# Patient Record
Sex: Female | Born: 1955
Health system: Southern US, Community
[De-identification: ages and names within clinical notes are randomized; demographics above are authoritative.]

## PROBLEM LIST (undated history)

## (undated) DIAGNOSIS — F329 Major depressive disorder, single episode, unspecified: Secondary | ICD-10-CM

## (undated) DIAGNOSIS — K222 Esophageal obstruction: Secondary | ICD-10-CM

## (undated) DIAGNOSIS — N029 Recurrent and persistent hematuria with unspecified morphologic changes: Secondary | ICD-10-CM

## (undated) DIAGNOSIS — K219 Gastro-esophageal reflux disease without esophagitis: Secondary | ICD-10-CM

## (undated) DIAGNOSIS — T7840XA Allergy, unspecified, initial encounter: Secondary | ICD-10-CM

## (undated) DIAGNOSIS — J449 Chronic obstructive pulmonary disease, unspecified: Secondary | ICD-10-CM

## (undated) DIAGNOSIS — K589 Irritable bowel syndrome without diarrhea: Secondary | ICD-10-CM

## (undated) DIAGNOSIS — M1990 Unspecified osteoarthritis, unspecified site: Secondary | ICD-10-CM

## (undated) DIAGNOSIS — N184 Chronic kidney disease, stage 4 (severe): Secondary | ICD-10-CM

## (undated) DIAGNOSIS — E1140 Type 2 diabetes mellitus with diabetic neuropathy, unspecified: Secondary | ICD-10-CM

## (undated) DIAGNOSIS — E119 Type 2 diabetes mellitus without complications: Secondary | ICD-10-CM

## (undated) DIAGNOSIS — F32A Depression, unspecified: Secondary | ICD-10-CM

## (undated) DIAGNOSIS — I2510 Atherosclerotic heart disease of native coronary artery without angina pectoris: Secondary | ICD-10-CM

## (undated) DIAGNOSIS — I10 Essential (primary) hypertension: Secondary | ICD-10-CM

## (undated) DIAGNOSIS — I700 Atherosclerosis of aorta: Secondary | ICD-10-CM

## (undated) DIAGNOSIS — N200 Calculus of kidney: Secondary | ICD-10-CM

## (undated) DIAGNOSIS — I272 Pulmonary hypertension, unspecified: Secondary | ICD-10-CM

## (undated) DIAGNOSIS — I739 Peripheral vascular disease, unspecified: Secondary | ICD-10-CM

## (undated) DIAGNOSIS — IMO0001 Reserved for inherently not codable concepts without codable children: Secondary | ICD-10-CM

## (undated) DIAGNOSIS — E785 Hyperlipidemia, unspecified: Secondary | ICD-10-CM

## (undated) DIAGNOSIS — E669 Obesity, unspecified: Secondary | ICD-10-CM

## (undated) HISTORY — DX: Type 2 diabetes mellitus without complications: E11.9

## (undated) HISTORY — PX: ABDOMINAL HYSTERECTOMY: SHX81

## (undated) HISTORY — PX: CARPAL TUNNEL RELEASE: SHX101

## (undated) HISTORY — DX: Calculus of kidney: N20.0

## (undated) HISTORY — DX: Allergy, unspecified, initial encounter: T78.40XA

## (undated) HISTORY — DX: Hyperlipidemia, unspecified: E78.5

## (undated) HISTORY — DX: Reserved for inherently not codable concepts without codable children: IMO0001

## (undated) HISTORY — DX: Obesity, unspecified: E66.9

## (undated) HISTORY — DX: Essential (primary) hypertension: I10

## (undated) HISTORY — DX: Irritable bowel syndrome, unspecified: K58.9

## (undated) HISTORY — PX: CARDIAC CATHETERIZATION: SHX172

## (undated) HISTORY — DX: Depression, unspecified: F32.A

## (undated) HISTORY — DX: Unspecified osteoarthritis, unspecified site: M19.90

## (undated) HISTORY — DX: Esophageal obstruction: K22.2

## (undated) HISTORY — DX: Recurrent and persistent hematuria with unspecified morphologic changes: N02.9

## (undated) HISTORY — DX: Peripheral vascular disease, unspecified: I73.9

## (undated) HISTORY — DX: Major depressive disorder, single episode, unspecified: F32.9

## (undated) HISTORY — DX: Gastro-esophageal reflux disease without esophagitis: K21.9

## (undated) HISTORY — DX: Pulmonary hypertension, unspecified: I27.20

## (undated) HISTORY — DX: Atherosclerosis of aorta: I70.0

---

## 1998-02-14 ENCOUNTER — Observation Stay (HOSPITAL_COMMUNITY): Admission: AD | Admit: 1998-02-14 | Discharge: 1998-02-15 | Payer: Self-pay | Admitting: Cardiology

## 1998-03-01 ENCOUNTER — Inpatient Hospital Stay: Admission: RE | Admit: 1998-03-01 | Discharge: 1998-03-03 | Payer: Self-pay | Admitting: *Deleted

## 1999-01-26 ENCOUNTER — Encounter: Payer: Self-pay | Admitting: Cardiology

## 1999-01-26 ENCOUNTER — Ambulatory Visit (HOSPITAL_COMMUNITY): Admission: RE | Admit: 1999-01-26 | Discharge: 1999-01-26 | Payer: Self-pay | Admitting: Cardiology

## 2004-03-14 ENCOUNTER — Ambulatory Visit (HOSPITAL_COMMUNITY): Admission: RE | Admit: 2004-03-14 | Discharge: 2004-03-14 | Payer: Self-pay | Admitting: Internal Medicine

## 2004-03-14 ENCOUNTER — Encounter: Payer: Self-pay | Admitting: Internal Medicine

## 2004-03-14 ENCOUNTER — Encounter (INDEPENDENT_AMBULATORY_CARE_PROVIDER_SITE_OTHER): Payer: Self-pay | Admitting: Specialist

## 2004-09-18 ENCOUNTER — Ambulatory Visit: Payer: Self-pay | Admitting: Internal Medicine

## 2004-10-12 ENCOUNTER — Ambulatory Visit: Payer: Self-pay | Admitting: Internal Medicine

## 2004-11-09 ENCOUNTER — Ambulatory Visit: Payer: Self-pay | Admitting: Internal Medicine

## 2005-01-14 ENCOUNTER — Ambulatory Visit: Payer: Self-pay | Admitting: Internal Medicine

## 2005-05-14 ENCOUNTER — Ambulatory Visit: Payer: Self-pay | Admitting: Internal Medicine

## 2005-09-16 ENCOUNTER — Ambulatory Visit: Payer: Self-pay | Admitting: Internal Medicine

## 2005-10-15 ENCOUNTER — Ambulatory Visit: Payer: Self-pay | Admitting: Internal Medicine

## 2006-01-16 ENCOUNTER — Ambulatory Visit: Payer: Self-pay | Admitting: Internal Medicine

## 2006-05-19 ENCOUNTER — Ambulatory Visit: Payer: Self-pay | Admitting: Internal Medicine

## 2006-07-15 ENCOUNTER — Ambulatory Visit: Payer: Self-pay | Admitting: Internal Medicine

## 2006-07-24 ENCOUNTER — Encounter: Payer: Self-pay | Admitting: Internal Medicine

## 2006-07-24 ENCOUNTER — Ambulatory Visit: Payer: Self-pay | Admitting: Internal Medicine

## 2006-08-26 ENCOUNTER — Ambulatory Visit: Payer: Self-pay | Admitting: Internal Medicine

## 2006-09-16 ENCOUNTER — Ambulatory Visit: Payer: Self-pay | Admitting: Internal Medicine

## 2007-01-15 ENCOUNTER — Ambulatory Visit: Payer: Self-pay | Admitting: Internal Medicine

## 2007-01-15 LAB — CONVERTED CEMR LAB
ALT: 19 units/L (ref 0–40)
Albumin: 3.3 g/dL — ABNORMAL LOW (ref 3.5–5.2)
BUN: 7 mg/dL (ref 6–23)
CO2: 35 meq/L — ABNORMAL HIGH (ref 19–32)
Calcium: 9 mg/dL (ref 8.4–10.5)
Chloride: 98 meq/L (ref 96–112)
Cholesterol: 123 mg/dL (ref 0–200)
Creatinine, Ser: 1.1 mg/dL (ref 0.4–1.2)
GFR calc Af Amer: 67 mL/min
GFR calc non Af Amer: 55 mL/min
Glucose, Bld: 74 mg/dL (ref 70–99)
HDL: 29.1 mg/dL — ABNORMAL LOW (ref >39.0)
Hgb A1c MFr Bld: 6.9 % — ABNORMAL HIGH (ref 4.6–6.0)
LDL Cholesterol: 54 mg/dL (ref 0–99)
Phosphorus: 3.5 mg/dL (ref 2.3–4.6)
Potassium: 4.1 meq/L (ref 3.5–5.1)
Sodium: 139 meq/L (ref 135–145)
Total CHOL/HDL Ratio: 4.2
Triglycerides: 200 mg/dL — ABNORMAL HIGH (ref 0–149)
VLDL: 40 mg/dL (ref 0–40)

## 2007-02-05 ENCOUNTER — Ambulatory Visit: Payer: Medicare Other | Admitting: Internal Medicine

## 2007-02-25 ENCOUNTER — Telehealth: Payer: Self-pay | Admitting: Internal Medicine

## 2007-02-26 DIAGNOSIS — I10 Essential (primary) hypertension: Secondary | ICD-10-CM

## 2007-02-26 DIAGNOSIS — M1990 Unspecified osteoarthritis, unspecified site: Secondary | ICD-10-CM | POA: Insufficient documentation

## 2007-02-26 DIAGNOSIS — F39 Unspecified mood [affective] disorder: Secondary | ICD-10-CM | POA: Insufficient documentation

## 2007-02-26 DIAGNOSIS — E785 Hyperlipidemia, unspecified: Secondary | ICD-10-CM | POA: Insufficient documentation

## 2007-02-26 DIAGNOSIS — E1149 Type 2 diabetes mellitus with other diabetic neurological complication: Secondary | ICD-10-CM | POA: Insufficient documentation

## 2007-02-26 DIAGNOSIS — I739 Peripheral vascular disease, unspecified: Secondary | ICD-10-CM

## 2007-02-26 DIAGNOSIS — K219 Gastro-esophageal reflux disease without esophagitis: Secondary | ICD-10-CM

## 2007-02-27 DIAGNOSIS — I25119 Atherosclerotic heart disease of native coronary artery with unspecified angina pectoris: Secondary | ICD-10-CM

## 2007-02-27 DIAGNOSIS — I2510 Atherosclerotic heart disease of native coronary artery without angina pectoris: Secondary | ICD-10-CM | POA: Insufficient documentation

## 2007-02-27 DIAGNOSIS — M549 Dorsalgia, unspecified: Secondary | ICD-10-CM

## 2007-02-27 DIAGNOSIS — G8929 Other chronic pain: Secondary | ICD-10-CM

## 2007-03-31 ENCOUNTER — Telehealth (INDEPENDENT_AMBULATORY_CARE_PROVIDER_SITE_OTHER): Payer: Self-pay | Admitting: *Deleted

## 2007-04-29 ENCOUNTER — Telehealth (INDEPENDENT_AMBULATORY_CARE_PROVIDER_SITE_OTHER): Payer: Self-pay | Admitting: *Deleted

## 2007-05-12 ENCOUNTER — Encounter (INDEPENDENT_AMBULATORY_CARE_PROVIDER_SITE_OTHER): Payer: Self-pay | Admitting: *Deleted

## 2007-05-15 ENCOUNTER — Ambulatory Visit: Payer: Self-pay | Admitting: Internal Medicine

## 2007-05-18 LAB — CONVERTED CEMR LAB: Hgb A1c MFr Bld: 6.9 % — ABNORMAL HIGH (ref 4.6–6.0)

## 2007-05-29 ENCOUNTER — Telehealth (INDEPENDENT_AMBULATORY_CARE_PROVIDER_SITE_OTHER): Payer: Self-pay | Admitting: *Deleted

## 2007-07-02 ENCOUNTER — Telehealth: Payer: Self-pay | Admitting: Internal Medicine

## 2007-07-30 ENCOUNTER — Telehealth (INDEPENDENT_AMBULATORY_CARE_PROVIDER_SITE_OTHER): Payer: Self-pay | Admitting: *Deleted

## 2007-08-27 ENCOUNTER — Telehealth (INDEPENDENT_AMBULATORY_CARE_PROVIDER_SITE_OTHER): Payer: Self-pay | Admitting: *Deleted

## 2007-09-01 ENCOUNTER — Telehealth (INDEPENDENT_AMBULATORY_CARE_PROVIDER_SITE_OTHER): Payer: Self-pay | Admitting: *Deleted

## 2007-09-16 ENCOUNTER — Ambulatory Visit: Payer: Self-pay | Admitting: Internal Medicine

## 2007-09-17 LAB — CONVERTED CEMR LAB
ALT: 13 units/L (ref 0–35)
Albumin: 3.4 g/dL — ABNORMAL LOW (ref 3.5–5.2)
BUN: 5 mg/dL — ABNORMAL LOW (ref 6–23)
Basophils Absolute: 0.1 10*3/uL (ref 0.0–0.1)
Basophils Relative: 0.7 % (ref 0.0–1.0)
CO2: 33 meq/L — ABNORMAL HIGH (ref 19–32)
Calcium: 9.2 mg/dL (ref 8.4–10.5)
Chloride: 98 meq/L (ref 96–112)
Cholesterol: 120 mg/dL (ref 0–200)
Creatinine, Ser: 1 mg/dL (ref 0.4–1.2)
Creatinine,U: 36.4 mg/dL
Eosinophils Absolute: 0.4 10*3/uL (ref 0.0–0.6)
Eosinophils Relative: 3.1 % (ref 0.0–5.0)
GFR calc Af Amer: 75 mL/min
GFR calc non Af Amer: 62 mL/min
Glucose, Bld: 135 mg/dL — ABNORMAL HIGH (ref 70–99)
HCT: 39 % (ref 36.0–46.0)
HDL: 26.6 mg/dL — ABNORMAL LOW (ref >39.0)
Hemoglobin: 13.2 g/dL (ref 12.0–15.0)
Hgb A1c MFr Bld: 6.9 % — ABNORMAL HIGH (ref 4.6–6.0)
LDL Cholesterol: 62 mg/dL (ref 0–99)
Lymphocytes Relative: 27.9 % (ref 12.0–46.0)
MCHC: 33.8 g/dL (ref 30.0–36.0)
MCV: 85.7 fL (ref 78.0–100.0)
Microalb Creat Ratio: 11 mg/g (ref 0.0–30.0)
Microalb, Ur: 0.4 mg/dL (ref 0.0–1.9)
Monocytes Absolute: 0.8 10*3/uL — ABNORMAL HIGH (ref 0.2–0.7)
Monocytes Relative: 6.5 % (ref 3.0–11.0)
Neutro Abs: 7.6 10*3/uL (ref 1.4–7.7)
Neutrophils Relative %: 61.8 % (ref 43.0–77.0)
Phosphorus: 3.9 mg/dL (ref 2.3–4.6)
Platelets: 406 10*3/uL — ABNORMAL HIGH (ref 150–400)
Potassium: 4.3 meq/L (ref 3.5–5.1)
RBC: 4.55 M/uL (ref 3.87–5.11)
RDW: 16.3 % — ABNORMAL HIGH (ref 11.5–14.6)
Sodium: 139 meq/L (ref 135–145)
TSH: 1.52 microintl units/mL (ref 0.35–5.50)
Total CHOL/HDL Ratio: 4.5
Triglycerides: 158 mg/dL — ABNORMAL HIGH (ref 0–149)
VLDL: 32 mg/dL (ref 0–40)
WBC: 12.4 10*3/uL — ABNORMAL HIGH (ref 4.5–10.5)

## 2007-09-29 ENCOUNTER — Encounter: Payer: Self-pay | Admitting: Internal Medicine

## 2007-10-30 ENCOUNTER — Telehealth (INDEPENDENT_AMBULATORY_CARE_PROVIDER_SITE_OTHER): Payer: Self-pay | Admitting: *Deleted

## 2007-12-02 ENCOUNTER — Telehealth (INDEPENDENT_AMBULATORY_CARE_PROVIDER_SITE_OTHER): Payer: Self-pay | Admitting: *Deleted

## 2007-12-04 ENCOUNTER — Telehealth (INDEPENDENT_AMBULATORY_CARE_PROVIDER_SITE_OTHER): Payer: Self-pay | Admitting: *Deleted

## 2007-12-17 ENCOUNTER — Telehealth (INDEPENDENT_AMBULATORY_CARE_PROVIDER_SITE_OTHER): Payer: Self-pay | Admitting: *Deleted

## 2007-12-30 ENCOUNTER — Telehealth (INDEPENDENT_AMBULATORY_CARE_PROVIDER_SITE_OTHER): Payer: Self-pay | Admitting: *Deleted

## 2008-01-18 ENCOUNTER — Ambulatory Visit: Payer: Self-pay | Admitting: Internal Medicine

## 2008-01-20 LAB — CONVERTED CEMR LAB
ALT: 14 units/L (ref 0–35)
AST: 15 units/L (ref 0–37)
Albumin: 3.8 g/dL (ref 3.5–5.2)
Alkaline Phosphatase: 65 units/L (ref 39–117)
Basophils Absolute: 0.2 10*3/uL — ABNORMAL HIGH (ref 0.0–0.1)
Basophils Relative: 1.3 % — ABNORMAL HIGH (ref 0.0–1.0)
Bilirubin, Direct: 0.1 mg/dL (ref 0.0–0.3)
Cholesterol: 131 mg/dL (ref 0–200)
Eosinophils Absolute: 0.4 10*3/uL (ref 0.0–0.6)
Eosinophils Relative: 3 % (ref 0.0–5.0)
HCT: 42.5 % (ref 36.0–46.0)
HDL: 28.6 mg/dL — ABNORMAL LOW (ref >39.0)
Hemoglobin: 13.6 g/dL (ref 12.0–15.0)
Hgb A1c MFr Bld: 6.6 % — ABNORMAL HIGH (ref 4.6–6.0)
LDL Cholesterol: 64 mg/dL (ref 0–99)
Lymphocytes Relative: 37 % (ref 12.0–46.0)
MCHC: 32 g/dL (ref 30.0–36.0)
MCV: 87.5 fL (ref 78.0–100.0)
Monocytes Absolute: 0.7 10*3/uL (ref 0.2–0.7)
Monocytes Relative: 6.2 % (ref 3.0–11.0)
Neutro Abs: 6.1 10*3/uL (ref 1.4–7.7)
Neutrophils Relative %: 52.5 % (ref 43.0–77.0)
Platelets: 343 10*3/uL (ref 150–400)
RBC: 4.85 M/uL (ref 3.87–5.11)
RDW: 16 % — ABNORMAL HIGH (ref 11.5–14.6)
Total Bilirubin: 0.4 mg/dL (ref 0.3–1.2)
Total CHOL/HDL Ratio: 4.6
Total Protein: 7.5 g/dL (ref 6.0–8.3)
Triglycerides: 194 mg/dL — ABNORMAL HIGH (ref 0–149)
VLDL: 39 mg/dL (ref 0–40)
WBC: 11.8 10*3/uL — ABNORMAL HIGH (ref 4.5–10.5)

## 2008-02-08 ENCOUNTER — Telehealth: Payer: Self-pay | Admitting: Family Medicine

## 2008-02-29 ENCOUNTER — Telehealth (INDEPENDENT_AMBULATORY_CARE_PROVIDER_SITE_OTHER): Payer: Self-pay | Admitting: *Deleted

## 2008-03-11 ENCOUNTER — Telehealth (INDEPENDENT_AMBULATORY_CARE_PROVIDER_SITE_OTHER): Payer: Self-pay | Admitting: *Deleted

## 2008-03-14 ENCOUNTER — Telehealth (INDEPENDENT_AMBULATORY_CARE_PROVIDER_SITE_OTHER): Payer: Self-pay | Admitting: *Deleted

## 2008-04-13 ENCOUNTER — Telehealth: Payer: Self-pay | Admitting: Internal Medicine

## 2008-05-20 ENCOUNTER — Ambulatory Visit: Payer: Self-pay | Admitting: Internal Medicine

## 2008-05-26 ENCOUNTER — Telehealth (INDEPENDENT_AMBULATORY_CARE_PROVIDER_SITE_OTHER): Payer: Self-pay | Admitting: *Deleted

## 2008-06-24 ENCOUNTER — Telehealth (INDEPENDENT_AMBULATORY_CARE_PROVIDER_SITE_OTHER): Payer: Self-pay | Admitting: *Deleted

## 2008-07-14 ENCOUNTER — Telehealth: Payer: Self-pay | Admitting: Internal Medicine

## 2008-07-26 ENCOUNTER — Telehealth: Payer: Self-pay | Admitting: Internal Medicine

## 2008-08-02 ENCOUNTER — Telehealth: Payer: Self-pay | Admitting: Internal Medicine

## 2008-08-17 ENCOUNTER — Telehealth: Payer: Self-pay | Admitting: Internal Medicine

## 2008-08-29 ENCOUNTER — Ambulatory Visit: Payer: Self-pay | Admitting: Internal Medicine

## 2008-08-31 ENCOUNTER — Telehealth: Payer: Self-pay | Admitting: Internal Medicine

## 2008-09-01 LAB — CONVERTED CEMR LAB
ALT: 14 units/L (ref 0–35)
AST: 19 units/L (ref 0–37)
Albumin: 4 g/dL (ref 3.5–5.2)
Alkaline Phosphatase: 71 units/L (ref 39–117)
BUN: 11 mg/dL (ref 6–23)
Basophils Absolute: 0.1 10*3/uL (ref 0.0–0.1)
Basophils Relative: 0.5 % (ref 0.0–3.0)
CO2: 34 meq/L — ABNORMAL HIGH (ref 19–32)
CRP, High Sensitivity: 4 (ref 0.00–5.00)
Calcium: 9.2 mg/dL (ref 8.4–10.5)
Chloride: 101 meq/L (ref 96–112)
Creatinine, Ser: 1.3 mg/dL — ABNORMAL HIGH (ref 0.4–1.2)
Eosinophils Absolute: 0.4 10*3/uL (ref 0.0–0.7)
Eosinophils Relative: 2.5 % (ref 0.0–5.0)
GFR calc Af Amer: 55 mL/min
GFR calc non Af Amer: 45 mL/min
Glucose, Bld: 54 mg/dL — ABNORMAL LOW (ref 70–99)
HCT: 39.5 % (ref 36.0–46.0)
Hemoglobin: 13.6 g/dL (ref 12.0–15.0)
Hgb A1c MFr Bld: 6.4 % — ABNORMAL HIGH (ref 4.6–6.0)
Lymphocytes Relative: 39.6 % (ref 12.0–46.0)
MCHC: 34.3 g/dL (ref 30.0–36.0)
MCV: 86.9 fL (ref 78.0–100.0)
Monocytes Absolute: 1 10*3/uL (ref 0.1–1.0)
Monocytes Relative: 6.4 % (ref 3.0–12.0)
Neutro Abs: 8.2 10*3/uL — ABNORMAL HIGH (ref 1.4–7.7)
Neutrophils Relative %: 51 % (ref 43.0–77.0)
Platelets: 401 10*3/uL — ABNORMAL HIGH (ref 150–400)
Potassium: 4.1 meq/L (ref 3.5–5.1)
RBC: 4.55 M/uL (ref 3.87–5.11)
RDW: 15 % — ABNORMAL HIGH (ref 11.5–14.6)
Sodium: 144 meq/L (ref 135–145)
TSH: 2.3 microintl units/mL (ref 0.35–5.50)
Total Bilirubin: 0.4 mg/dL (ref 0.3–1.2)
Total Protein: 7.6 g/dL (ref 6.0–8.3)
WBC: 16 10*3/uL — ABNORMAL HIGH (ref 4.5–10.5)

## 2008-09-06 ENCOUNTER — Telehealth: Payer: Self-pay | Admitting: Internal Medicine

## 2008-09-09 ENCOUNTER — Encounter: Payer: Self-pay | Admitting: Internal Medicine

## 2008-09-09 ENCOUNTER — Ambulatory Visit: Payer: Self-pay | Admitting: Internal Medicine

## 2008-09-09 ENCOUNTER — Telehealth: Payer: Self-pay | Admitting: Internal Medicine

## 2008-09-09 LAB — HM COLONOSCOPY

## 2008-09-26 ENCOUNTER — Ambulatory Visit: Payer: Self-pay | Admitting: Internal Medicine

## 2008-09-28 LAB — CONVERTED CEMR LAB
ALT: 17 units/L (ref 0–35)
AST: 23 units/L (ref 0–37)
Albumin: 3.8 g/dL (ref 3.5–5.2)
Alkaline Phosphatase: 62 units/L (ref 39–117)
BUN: 13 mg/dL (ref 6–23)
Bilirubin, Direct: 0.1 mg/dL (ref 0.0–0.3)
CO2: 34 meq/L — ABNORMAL HIGH (ref 19–32)
Calcium: 9.5 mg/dL (ref 8.4–10.5)
Chloride: 99 meq/L (ref 96–112)
Cholesterol: 151 mg/dL (ref 0–200)
Creatinine, Ser: 1.4 mg/dL — ABNORMAL HIGH (ref 0.4–1.2)
GFR calc Af Amer: 50 mL/min
GFR calc non Af Amer: 42 mL/min
Glucose, Bld: 60 mg/dL — ABNORMAL LOW (ref 70–99)
HDL: 31.6 mg/dL — ABNORMAL LOW (ref >39.0)
Hgb A1c MFr Bld: 6.3 % — ABNORMAL HIGH (ref 4.6–6.0)
LDL Cholesterol: 82 mg/dL (ref 0–99)
Phosphorus: 4.4 mg/dL (ref 2.3–4.6)
Potassium: 4.2 meq/L (ref 3.5–5.1)
Sodium: 139 meq/L (ref 135–145)
Total Bilirubin: 0.5 mg/dL (ref 0.3–1.2)
Total CHOL/HDL Ratio: 4.8
Total Protein: 7.4 g/dL (ref 6.0–8.3)
Triglycerides: 185 mg/dL — ABNORMAL HIGH (ref 0–149)
VLDL: 37 mg/dL (ref 0–40)

## 2008-10-12 ENCOUNTER — Telehealth: Payer: Self-pay | Admitting: Internal Medicine

## 2008-11-09 ENCOUNTER — Telehealth: Payer: Self-pay | Admitting: Internal Medicine

## 2008-12-14 ENCOUNTER — Telehealth: Payer: Self-pay | Admitting: Internal Medicine

## 2009-01-23 ENCOUNTER — Ambulatory Visit: Payer: Self-pay | Admitting: Internal Medicine

## 2009-01-23 LAB — CONVERTED CEMR LAB
BUN: 16 mg/dL (ref 6–23)
CO2: 34 meq/L — ABNORMAL HIGH (ref 19–32)
Calcium: 8.9 mg/dL (ref 8.4–10.5)
Chloride: 101 meq/L (ref 96–112)
Creatinine, Ser: 1.4 mg/dL — ABNORMAL HIGH (ref 0.4–1.2)
GFR calc non Af Amer: 41.47 mL/min (ref >60)
Glucose, Bld: 119 mg/dL — ABNORMAL HIGH (ref 70–99)
Hgb A1c MFr Bld: 6.9 % — ABNORMAL HIGH (ref 4.6–6.5)
Potassium: 4.3 meq/L (ref 3.5–5.1)
Sodium: 141 meq/L (ref 135–145)

## 2009-02-27 ENCOUNTER — Ambulatory Visit: Payer: Self-pay | Admitting: Internal Medicine

## 2009-03-22 ENCOUNTER — Telehealth: Payer: Self-pay | Admitting: Internal Medicine

## 2009-04-10 ENCOUNTER — Telehealth: Payer: Self-pay | Admitting: Internal Medicine

## 2009-05-15 ENCOUNTER — Telehealth: Payer: Self-pay | Admitting: Internal Medicine

## 2009-05-24 ENCOUNTER — Telehealth (INDEPENDENT_AMBULATORY_CARE_PROVIDER_SITE_OTHER): Payer: Self-pay | Admitting: *Deleted

## 2009-06-19 ENCOUNTER — Telehealth: Payer: Self-pay | Admitting: Internal Medicine

## 2009-07-05 ENCOUNTER — Ambulatory Visit: Payer: Self-pay | Admitting: Internal Medicine

## 2009-07-06 LAB — CONVERTED CEMR LAB
ALT: 19 units/L (ref 0–35)
AST: 21 units/L (ref 0–37)
Albumin: 3.6 g/dL (ref 3.5–5.2)
Alkaline Phosphatase: 119 units/L — ABNORMAL HIGH (ref 39–117)
BUN: 14 mg/dL (ref 6–23)
Bilirubin, Direct: 0 mg/dL (ref 0.0–0.3)
CO2: 32 meq/L (ref 19–32)
Calcium: 8.9 mg/dL (ref 8.4–10.5)
Chloride: 96 meq/L (ref 96–112)
Cholesterol: 170 mg/dL (ref 0–200)
Creatinine, Ser: 1.4 mg/dL — ABNORMAL HIGH (ref 0.4–1.2)
Direct LDL: 99.5 mg/dL
Glucose, Bld: 138 mg/dL — ABNORMAL HIGH (ref 70–99)
HDL: 28.9 mg/dL — ABNORMAL LOW (ref >39.00)
Hgb A1c MFr Bld: 8.4 % — ABNORMAL HIGH (ref 4.6–6.5)
Phosphorus: 4.4 mg/dL (ref 2.3–4.6)
Potassium: 4.9 meq/L (ref 3.5–5.1)
Sodium: 138 meq/L (ref 135–145)
TSH: 1.26 microintl units/mL (ref 0.35–5.50)
Total Bilirubin: 0.5 mg/dL (ref 0.3–1.2)
Total CHOL/HDL Ratio: 6
Total Protein: 7.6 g/dL (ref 6.0–8.3)
Triglycerides: 297 mg/dL — ABNORMAL HIGH (ref 0.0–149.0)
VLDL: 59.4 mg/dL — ABNORMAL HIGH (ref 0.0–40.0)

## 2009-07-17 ENCOUNTER — Telehealth: Payer: Self-pay | Admitting: Internal Medicine

## 2009-07-18 ENCOUNTER — Encounter: Payer: Self-pay | Admitting: Internal Medicine

## 2009-07-27 ENCOUNTER — Ambulatory Visit: Payer: Medicare Other | Admitting: Internal Medicine

## 2009-07-27 ENCOUNTER — Encounter: Payer: Self-pay | Admitting: Internal Medicine

## 2009-07-31 ENCOUNTER — Encounter: Payer: Self-pay | Admitting: Internal Medicine

## 2009-08-16 ENCOUNTER — Telehealth: Payer: Self-pay | Admitting: Internal Medicine

## 2009-08-17 ENCOUNTER — Ambulatory Visit: Payer: Self-pay | Admitting: Internal Medicine

## 2009-09-07 ENCOUNTER — Telehealth: Payer: Self-pay | Admitting: Internal Medicine

## 2009-09-27 ENCOUNTER — Telehealth: Payer: Self-pay | Admitting: Internal Medicine

## 2009-11-08 ENCOUNTER — Telehealth: Payer: Self-pay | Admitting: Internal Medicine

## 2009-11-15 ENCOUNTER — Telehealth: Payer: Self-pay | Admitting: Internal Medicine

## 2009-11-27 ENCOUNTER — Ambulatory Visit: Payer: Self-pay | Admitting: Internal Medicine

## 2009-11-28 LAB — CONVERTED CEMR LAB
Albumin: 3.5 g/dL (ref 3.5–5.2)
BUN: 11 mg/dL (ref 6–23)
CO2: 32 meq/L (ref 19–32)
Calcium: 8.9 mg/dL (ref 8.4–10.5)
Chloride: 97 meq/L (ref 96–112)
Creatinine, Ser: 1.3 mg/dL — ABNORMAL HIGH (ref 0.4–1.2)
GFR calc non Af Amer: 45.04 mL/min (ref >60)
Glucose, Bld: 188 mg/dL — ABNORMAL HIGH (ref 70–99)
Hgb A1c MFr Bld: 9.9 % — ABNORMAL HIGH (ref 4.6–6.5)
Phosphorus: 3.4 mg/dL (ref 2.3–4.6)
Potassium: 4.6 meq/L (ref 3.5–5.1)
Sodium: 136 meq/L (ref 135–145)

## 2009-12-11 ENCOUNTER — Telehealth: Payer: Self-pay | Admitting: Internal Medicine

## 2010-01-10 ENCOUNTER — Telehealth: Payer: Self-pay | Admitting: Internal Medicine

## 2010-02-12 ENCOUNTER — Telehealth: Payer: Self-pay | Admitting: Internal Medicine

## 2010-02-13 ENCOUNTER — Telehealth: Payer: Self-pay | Admitting: Internal Medicine

## 2010-03-14 ENCOUNTER — Telehealth: Payer: Self-pay | Admitting: Internal Medicine

## 2010-03-28 ENCOUNTER — Ambulatory Visit: Payer: Self-pay | Admitting: Internal Medicine

## 2010-03-29 LAB — CONVERTED CEMR LAB
ALT: 14 units/L (ref 0–35)
AST: 19 units/L (ref 0–37)
Albumin: 3.7 g/dL (ref 3.5–5.2)
Alkaline Phosphatase: 135 units/L — ABNORMAL HIGH (ref 39–117)
BUN: 10 mg/dL (ref 6–23)
Basophils Absolute: 0 10*3/uL (ref 0.0–0.1)
Basophils Relative: 0.5 % (ref 0.0–3.0)
Bilirubin, Direct: 0.1 mg/dL (ref 0.0–0.3)
CO2: 30 meq/L (ref 19–32)
Calcium: 9 mg/dL (ref 8.4–10.5)
Chloride: 98 meq/L (ref 96–112)
Cholesterol: 150 mg/dL (ref 0–200)
Creatinine, Ser: 1.2 mg/dL (ref 0.4–1.2)
Creatinine,U: 8.9 mg/dL
Direct LDL: 88.8 mg/dL
Eosinophils Absolute: 0.3 10*3/uL (ref 0.0–0.7)
Eosinophils Relative: 2.9 % (ref 0.0–5.0)
GFR calc non Af Amer: 49.81 mL/min (ref >60)
Glucose, Bld: 109 mg/dL — ABNORMAL HIGH (ref 70–99)
HCT: 32.8 % — ABNORMAL LOW (ref 36.0–46.0)
HDL: 32.7 mg/dL — ABNORMAL LOW (ref >39.00)
Hemoglobin: 10.7 g/dL — ABNORMAL LOW (ref 12.0–15.0)
Hgb A1c MFr Bld: 7.9 % — ABNORMAL HIGH (ref 4.6–6.5)
Lymphocytes Relative: 34.8 % (ref 12.0–46.0)
Lymphs Abs: 3.1 10*3/uL (ref 0.7–4.0)
MCHC: 32.7 g/dL (ref 30.0–36.0)
MCV: 72.8 fL — ABNORMAL LOW (ref 78.0–100.0)
Microalb Creat Ratio: 4.5 mg/g (ref 0.0–30.0)
Microalb, Ur: 0.4 mg/dL (ref 0.0–1.9)
Monocytes Absolute: 0.6 10*3/uL (ref 0.1–1.0)
Monocytes Relative: 6.8 % (ref 3.0–12.0)
Neutro Abs: 4.9 10*3/uL (ref 1.4–7.7)
Neutrophils Relative %: 55 % (ref 43.0–77.0)
Phosphorus: 3.7 mg/dL (ref 2.3–4.6)
Platelets: 428 10*3/uL — ABNORMAL HIGH (ref 150.0–400.0)
Potassium: 4.3 meq/L (ref 3.5–5.1)
RBC: 4.51 M/uL (ref 3.87–5.11)
RDW: 19.8 % — ABNORMAL HIGH (ref 11.5–14.6)
Sodium: 138 meq/L (ref 135–145)
TSH: 1.53 microintl units/mL (ref 0.35–5.50)
Total Bilirubin: 0.3 mg/dL (ref 0.3–1.2)
Total CHOL/HDL Ratio: 5
Total Protein: 7.3 g/dL (ref 6.0–8.3)
Triglycerides: 226 mg/dL — ABNORMAL HIGH (ref 0.0–149.0)
VLDL: 45.2 mg/dL — ABNORMAL HIGH (ref 0.0–40.0)
WBC: 8.9 10*3/uL (ref 4.5–10.5)

## 2010-04-02 ENCOUNTER — Telehealth: Payer: Self-pay | Admitting: Internal Medicine

## 2010-04-11 ENCOUNTER — Telehealth: Payer: Self-pay | Admitting: Internal Medicine

## 2010-04-17 ENCOUNTER — Ambulatory Visit: Payer: Self-pay | Admitting: Family Medicine

## 2010-04-20 ENCOUNTER — Encounter: Payer: Self-pay | Admitting: Internal Medicine

## 2010-04-25 LAB — HM DIABETES EYE EXAM

## 2010-05-03 ENCOUNTER — Ambulatory Visit: Payer: Self-pay | Admitting: Internal Medicine

## 2010-05-03 DIAGNOSIS — D649 Anemia, unspecified: Secondary | ICD-10-CM | POA: Insufficient documentation

## 2010-05-04 LAB — CONVERTED CEMR LAB
Basophils Relative: 0.6 % (ref 0.0–3.0)
Eosinophils Absolute: 0.3 10*3/uL (ref 0.0–0.7)
Lymphocytes Relative: 32.8 % (ref 12.0–46.0)
MCHC: 32.4 g/dL (ref 30.0–36.0)
Neutrophils Relative %: 56 % (ref 43.0–77.0)
Platelets: 353 10*3/uL (ref 150.0–400.0)
RBC: 4.96 M/uL (ref 3.87–5.11)
WBC: 9.6 10*3/uL (ref 4.5–10.5)

## 2010-05-18 ENCOUNTER — Telehealth: Payer: Self-pay | Admitting: Internal Medicine

## 2010-06-08 ENCOUNTER — Telehealth: Payer: Self-pay | Admitting: Internal Medicine

## 2010-06-14 ENCOUNTER — Telehealth: Payer: Self-pay | Admitting: Internal Medicine

## 2010-07-13 ENCOUNTER — Telehealth: Payer: Self-pay | Admitting: Internal Medicine

## 2010-07-31 ENCOUNTER — Ambulatory Visit: Payer: Self-pay | Admitting: Internal Medicine

## 2010-08-01 LAB — CONVERTED CEMR LAB
Basophils Relative: 0.5 % (ref 0.0–3.0)
Eosinophils Relative: 2.5 % (ref 0.0–5.0)
Hgb A1c MFr Bld: 8.4 % — ABNORMAL HIGH (ref 4.6–6.5)
Lymphocytes Relative: 26.3 % (ref 12.0–46.0)
MCV: 83.4 fL (ref 78.0–100.0)
Monocytes Relative: 6.1 % (ref 3.0–12.0)
Neutrophils Relative %: 64.6 % (ref 43.0–77.0)
Platelets: 360 10*3/uL (ref 150.0–400.0)
RBC: 4.87 M/uL (ref 3.87–5.11)
WBC: 12 10*3/uL — ABNORMAL HIGH (ref 4.5–10.5)

## 2010-08-07 ENCOUNTER — Encounter: Payer: Self-pay | Admitting: Internal Medicine

## 2010-08-07 ENCOUNTER — Ambulatory Visit: Payer: Commercial Managed Care - HMO | Admitting: Internal Medicine

## 2010-08-13 ENCOUNTER — Encounter (INDEPENDENT_AMBULATORY_CARE_PROVIDER_SITE_OTHER): Payer: Self-pay | Admitting: *Deleted

## 2010-08-14 ENCOUNTER — Telehealth: Payer: Self-pay | Admitting: Internal Medicine

## 2010-09-12 ENCOUNTER — Telehealth: Payer: Self-pay | Admitting: Internal Medicine

## 2010-10-12 ENCOUNTER — Telehealth: Payer: Self-pay | Admitting: Family Medicine

## 2010-10-15 ENCOUNTER — Telehealth: Payer: Self-pay | Admitting: Internal Medicine

## 2010-11-15 ENCOUNTER — Telehealth: Payer: Self-pay | Admitting: Internal Medicine

## 2010-11-20 ENCOUNTER — Telehealth: Payer: Self-pay | Admitting: Internal Medicine

## 2010-11-20 ENCOUNTER — Encounter: Payer: Self-pay | Admitting: Internal Medicine

## 2010-11-21 ENCOUNTER — Encounter: Payer: Self-pay | Admitting: Internal Medicine

## 2010-11-27 NOTE — Assessment & Plan Note (Signed)
Summary: 4MTH FOLLOW UP / LFW   Vital Signs:  Patient profile:   55 year old female Weight:      169 pounds Temp:     98.3 degrees F oral Pulse rate:   68 / minute Pulse rhythm:   regular BP sitting:   120 / 60  (left arm) Cuff size:   regular  Vitals Entered By: Edwin Dada CMA Deborra Medina) (March 28, 2010 9:22 AM) CC: 4 month follow-up   History of Present Illness: doing okay  Has worked harder on being more active Getting out more watching diet more  sugars better checks every other morning--  100-120 No problems on increased meds no hypoglycemic reactions has eye exam in June  No recent angina attacks only 1 in past 4 months No SOB  Mood has been better  Allergies: No Known Drug Allergies  Past History:  Past medical, surgical, family and social histories (including risk factors) reviewed for relevance to current acute and chronic problems.  Past Medical History: Reviewed history from 08/26/2008 and no changes required. Depression GERD/Barretts 02/2004---------------------------------------------Dr Carlean Purl Hyperlipidemia Hypertension Peripheral vascular disease Coronary artery disease Familial hematuria NIDDM with neuropathy--------------------------------------------[place] [facility] Irritable Bowel Syndrome Esophageal Stricture Obesity Nephrolithiasis Allergic rhinosinusitis Osteoarthritis  Past Surgical History: Reviewed history from 09/16/2007 and no changes required. Vaginal hysterctomy/ovaries out  [place] Bilateral carpal tunnel release  1998 L CEA    12/1997 Cardiolyte neg, ef 66%  3/00 Cath-subcritical 3 vessel cad 4/99 ABIi`s mild decrease 6/04 EGD--Barretts unchanged 9/07  Family History: Reviewed history from 01/23/2009 and no changes required. Family History of Ovarian Cancer: Mother Family History of Diabetes: Mother, Grandmother No FH of Colon Cancer: Thyroid cancer: maternal Uncle Melonoma: mother Uterine cancer:  Maternal granmother Lung cancer: Father Hematuria in many family members  Social History: Reviewed history from 09/16/2007 and no changes required. 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 Satisfied with pain control usually sleeps okay---never great but okay for her  Physical Exam  General:  alert and normal appearance.   Neck:  supple, no masses, no thyromegaly, no carotid bruits, and no cervical lymphadenopathy.   Lungs:  normal respiratory effort and normal breath sounds.   Heart:  normal rate, regular rhythm, no murmur, and no gallop.   Abdomen:  soft and non-tender.   Pulses:  faint on left 1+ on right foot Extremities:  no edema Neurologic:  alert & oriented X3, strength normal in all extremities, and gait normal.   Skin:  no suspicious lesions and no ulcerations.   Psych:  normally interactive, good eye contact, not anxious appearing, and not depressed appearing.    Diabetes Management Exam:    Foot Exam (with socks and/or shoes not present):       Sensory-Pinprick/Light touch:          Left medial foot (L-4): diminished          Left dorsal foot (L-5): diminished          Left lateral foot (S-1): diminished          Right medial foot (L-4): diminished          Right dorsal foot (L-5): diminished          Right lateral foot (S-1): diminished       Inspection:          Left  foot: normal          Right foot: normal       Nails:          Left foot: normal          Right foot: normal   Impression & Recommendations:  Problem # 1:  DIABETES MELLITUS, TYPE II, WITH NEUROLOGICAL COMPLICATIONS (IFO-277.41) Assessment Improved  does seem better will check labs  Her updated medication list for this problem includes:    Metformin Hcl 1000 Mg Tabs (Metformin hcl) .Marland Kitchen... 1 tab by mouth two times a day    Lisinopril 20 Mg Tabs (Lisinopril) .Marland Kitchen... Take 1  tablet by mouth    Amaryl 4 Mg Tabs (Glimepiride) .Marland Kitchen... Take 1 by mouth two times a day    Glucophage 500 Mg Tabs (Metformin hcl) .Marland Kitchen... Take one tablet two times a day    Bayer Aspirin 325 Mg Tabs (Aspirin) .Marland Kitchen... 1 by mouth once daily  Labs Reviewed: Creat: 1.3 (11/27/2009)    Reviewed HgBA1c results: 9.9 (11/27/2009)  8.4 (07/05/2009)  Orders: TLB-Microalbumin/Creat Ratio, Urine (82043-MALB) TLB-A1C / Hgb A1C (Glycohemoglobin) (83036-A1C)  Problem # 2:  DEPRESSION (ICD-311) Assessment: Unchanged steady mood satisfied with Rx  Her updated medication list for this problem includes:    Mirtazapine 30 Mg Tabs (Mirtazapine) .Marland Kitchen... Take 1/2 - 1  tablet by mouth at bedtime    Alprazolam 0.25 Mg Tabs (Alprazolam) .Marland Kitchen... 1 or 2 tablets three times a day  by mouth  Problem # 3:  CORONARY ARTERY DISEASE (ICD-414.00) Assessment: Improved less angina with improved activity level no changes  Her updated medication list for this problem includes:    Lisinopril 20 Mg Tabs (Lisinopril) .Marland Kitchen... Take 1 tablet by mouth    Demadex 20 Mg Tabs (Torsemide) .Marland Kitchen... 2 two times a day    Bayer Aspirin 325 Mg Tabs (Aspirin) .Marland Kitchen... 1 by mouth once daily    Metoprolol Tartrate 25 Mg Tabs (Metoprolol tartrate) .Marland Kitchen... 1 two times a day    Nitroglycerin 0.4 Mg Subl (Nitroglycerin) .Marland Kitchen... 1 under tongue as needed for chest pain  Problem # 4:  BACK PAIN, CHRONIC (ICD-724.5) Assessment: Unchanged stable on methadone pain contract executed  Her updated medication list for this problem includes:    Methadone Hcl 10 Mg Tabs (Methadone hcl) .Marland Kitchen... 2 four times daily    Bayer Aspirin 325 Mg Tabs (Aspirin) .Marland Kitchen... 1 by mouth once daily  Problem # 5:  HYPERTENSION (ICD-401.9) Assessment: Unchanged  good control will check urine microal  Her updated medication list for this problem includes:    Lisinopril 20 Mg Tabs (Lisinopril) .Marland Kitchen... Take 1 tablet by mouth    Demadex 20 Mg Tabs (Torsemide) .Marland Kitchen... 2 two times a day     Metoprolol Tartrate 25 Mg Tabs (Metoprolol tartrate) .Marland Kitchen... 1 two times a day  BP today: 120/60 Prior BP: 148/68 (11/27/2009)  Labs Reviewed: K+: 4.6 (11/27/2009) Creat: : 1.3 (11/27/2009)   Chol: 170 (07/05/2009)   HDL: 28.90 (07/05/2009)   LDL: 82 (09/26/2008)   TG: 297.0 (07/05/2009)  Orders: TLB-Renal Function Panel (80069-RENAL) TLB-CBC Platelet - w/Differential (85025-CBCD) TLB-TSH (Thyroid Stimulating Hormone) (84443-TSH)  Problem # 6:  HYPERLIPIDEMIA (ICD-272.4) Assessment: Unchanged  due for labs  Her updated medication list for this problem includes:    Lovastatin 40 Mg Tabs (Lovastatin) .Marland Kitchen... 2 daily  Labs Reviewed: SGOT: 21 (07/05/2009)   SGPT: 19 (07/05/2009)   HDL:28.90 (07/05/2009), 31.6 (09/26/2008)  LDL:82 (09/26/2008), 64 (01/18/2008)  Chol:170 (07/05/2009), 151 (09/26/2008)  Trig:297.0 (07/05/2009), 185 (09/26/2008)  Orders: TLB-Lipid Panel (80061-LIPID) TLB-Hepatic/Liver Function Pnl (80076-HEPATIC) Venipuncture (72158)  Complete Medication List: 1)  Metformin Hcl 1000 Mg Tabs (Metformin hcl) .Marland Kitchen.. 1 tab by mouth two times a day 2)  Mirtazapine 30 Mg Tabs (Mirtazapine) .... Take 1/2 - 1  tablet by mouth at bedtime 3)  Lisinopril 20 Mg Tabs (Lisinopril) .... Take 1 tablet by mouth 4)  Methadone Hcl 10 Mg Tabs (Methadone hcl) .... 2 four times daily 5)  Amaryl 4 Mg Tabs (Glimepiride) .... Take 1 by mouth two times a day 6)  Demadex 20 Mg Tabs (Torsemide) .... 2 two times a day 7)  Glucophage 500 Mg Tabs (Metformin hcl) .... Take one tablet two times a day 8)  Neurontin 300 Mg Caps (Gabapentin) .... Take 1 capsule two times a day then 2 at bedtime 9)  Lovastatin 40 Mg Tabs (Lovastatin) .... 2 daily 10)  Loratadine 10 Mg Tabs (Loratadine) .... Once daily as needed 11)  Bayer Aspirin 325 Mg Tabs (Aspirin) .Marland Kitchen.. 1 by mouth once daily 12)  Metoprolol Tartrate 25 Mg Tabs (Metoprolol tartrate) .Marland Kitchen.. 1 two times a day 13)  Alprazolam 0.25 Mg Tabs (Alprazolam) .Marland Kitchen.. 1  or 2 tablets three times a day  by mouth 14)  Pantoprazole Sodium 40 Mg Tbec (Pantoprazole sodium) .... Take one by mouth two times a day 15)  Nitroglycerin 0.4 Mg Subl (Nitroglycerin) .Marland Kitchen.. 1 under tongue as needed for chest pain  Patient Instructions: 1)  Please schedule a follow-up appointment in 4 months .   Current Allergies (reviewed today): No known allergies

## 2010-11-27 NOTE — Progress Notes (Signed)
Summary: refill request for methadone  Phone Note Refill Request Message from:  Patient  Refills Requested: Medication #1:  METHADONE HCL 10 MG TABS 2 four times daily Please call pt when ready.   Initial call taken by: Marty Heck CMA, [place],  September 12, 2010 3:05 PM  Follow-up for Phone Call        Rx written Follow-up by: Claris Gower MD,  September 13, 2010 1:56 PM  Additional Follow-up for Phone Call Additional follow up Details #1::        Spoke with patient and advised rx ready for pick-up  Additional Follow-up by: Edwin Dada CMA Deborra Medina),  September 13, 2010 2:53 PM    New/Updated Medications: METHADONE HCL 10 MG TABS (METHADONE HCL) 2 four times daily Prescriptions: METHADONE HCL 10 MG TABS (METHADONE HCL) 2 four times daily  #240 x 0   Entered and Authorized by:   Claris Gower MD   Signed by:   Claris Gower MD on 09/13/2010   Method used:   Print then Give to Patient   RxID:   [id number]

## 2010-11-27 NOTE — Letter (Signed)
Summary: [facility]   Imported By: Edmonia James 05/02/2010 11:33:36  _____________________________________________________________________  External Attachment:    Type:   Image     Comment:   External Document  Appended Document: [facility]     Clinical Lists Changes  Observations: Added new observation of DIAB EYE EX: No diabetic retinopathy.    (04/25/2010 19:07)       Diabetic Eye Exam  Procedure date:  04/25/2010  Findings:      No diabetic retinopathy.

## 2010-11-27 NOTE — Assessment & Plan Note (Signed)
Summary: 4MTH FOLLOW UP / LFW   Vital Signs:  Patient profile:   55 year old female Weight:      174 pounds BMI:     31.94 Temp:     98.3 degrees F oral Pulse rate:   90 / minute Pulse rhythm:   regular BP sitting:   148 / 68  (left arm) Cuff size:   regular  Vitals Entered By: Edwin Dada CMA Deborra Medina) (November 27, 2009 10:54 AM) CC: 4 month follow-up   History of Present Illness: Doing fair  Mood has been about the same Not real happy but mostly satisfied Still living alone and prefers to stay in for the most part  Still with sleep problems no major changes discussed dosing on mirtazapine  Sugars running higher Creeping higher--- up to 170 or even 200 fasting checks every AM Has been using mom's leftover metformin--she will check if she is cutting a XR tab No hypoglycemic reactions Has eye appt in February  Pain control is stable same methadone  has had 3 small angina attacks NTG always effective  Allergies: No Known Drug Allergies  Past History:  Past medical, surgical, family and social histories (including risk factors) reviewed for relevance to current acute and chronic problems.  Past Medical History: Reviewed history from 08/26/2008 and no changes required. Depression GERD/Barretts 02/2004---------------------------------------------Dr Carlean Purl Hyperlipidemia Hypertension Peripheral vascular disease Coronary artery disease Familial hematuria NIDDM with neuropathy--------------------------------------------[place] [facility] Irritable Bowel Syndrome Esophageal Stricture Obesity Nephrolithiasis Allergic rhinosinusitis Osteoarthritis  Past Surgical History: Reviewed history from 09/16/2007 and no changes required. Vaginal hysterctomy/ovaries out  [place] Bilateral carpal tunnel release  1998 L CEA    12/1997 Cardiolyte neg, ef 66%  3/00 Cath-subcritical 3 vessel cad 4/99 ABIi`s mild decrease 6/04 EGD--Barretts unchanged  9/07  Family History: Reviewed history from 01/23/2009 and no changes required. Family History of Ovarian Cancer: Mother Family History of Diabetes: Mother, Grandmother No FH of Colon Cancer: Thyroid cancer: maternal Uncle Melonoma: mother Uterine cancer: Maternal granmother Lung cancer: Father Hematuria in many family members  Social History: Reviewed history from 09/16/2007 and no changes required. 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  NAD Neck:  supple, no masses, no thyromegaly, and no cervical lymphadenopathy.   Lungs:  normal respiratory effort and normal breath sounds.   Heart:  normal rate, regular rhythm, and no gallop.   Coarse systolic murmur at base to right carotid Pulses:  feet warm but no palpable pulses Extremities:  no edema Psych:  normally interactive, good eye contact, not anxious appearing, and not depressed appearing.    Diabetes Management Exam:    Foot Exam (with socks and/or shoes not present):       Sensory-Pinprick/Light touch:          Left medial foot (L-4): normal          Left dorsal foot (L-5): normal          Left lateral foot (S-1): normal          Right medial foot (L-4): normal          Right dorsal foot (L-5): normal          Right lateral foot (S-1): normal       Inspection:  Left foot: normal          Right foot: normal       Nails:          Left foot: thickened          Right foot: thickened   Impression & Recommendations:  Problem # 1:  DEPRESSION (ICD-311) Assessment Deteriorated mood stable but sleep worse will try decreasing mirtazapine dose will add trazodone if not improved  Her updated medication list for this problem includes:    Mirtazapine 30 Mg Tabs (Mirtazapine) .Marland Kitchen... Take 1/2 - 1  tablet by mouth at bedtime     Alprazolam 0.25 Mg Tabs (Alprazolam) .Marland Kitchen... 1 or 2 tablets three times a day  by mouth  Problem # 2:  CORONARY ARTERY DISEASE (ICD-414.00) Assessment: Unchanged occ angina--NTG helps seems to be stable pattern  Her updated medication list for this problem includes:    Lisinopril 20 Mg Tabs (Lisinopril) .Marland Kitchen... Take 1 tablet by mouth    Demadex 20 Mg Tabs (Torsemide) .Marland Kitchen... 2 two times a day    Bayer Aspirin 325 Mg Tabs (Aspirin) .Marland Kitchen... 1 by mouth once daily    Metoprolol Tartrate 25 Mg Tabs (Metoprolol tartrate) .Marland Kitchen... 1 two times a day    Nitroglycerin 0.4 Mg Subl (Nitroglycerin) .Marland Kitchen... 1 under tongue as needed for chest pain  Problem # 3:  DIABETES MELLITUS, TYPE II, WITH NEUROLOGICAL COMPLICATIONS (WHQ-759.16) Assessment: Deteriorated  will increase metformin  recheck labs If diarrhea on higher dose, will increase the amaryl then  Her updated medication list for this problem includes:    Lisinopril 20 Mg Tabs (Lisinopril) .Marland Kitchen... Take 1 tablet by mouth    Amaryl 4 Mg Tabs (Glimepiride) .Marland Kitchen... Take 1 tablet by mouth once a day    Glucophage 500 Mg Tabs (Metformin hcl) .Marland Kitchen... Take one tablet two times a day    Bayer Aspirin 325 Mg Tabs (Aspirin) .Marland Kitchen... 1 by mouth once daily    Metformin Hcl 1000 Mg Tabs (Metformin hcl) .Marland Kitchen... 1 tab by mouth two times a day  Labs Reviewed: Creat: 1.4 (07/05/2009)    Reviewed HgBA1c results: 8.4 (07/05/2009)  6.9 (01/23/2009)  Orders: Venipuncture (38466) TLB-Renal Function Panel (80069-RENAL) TLB-A1C / Hgb A1C (Glycohemoglobin) (83036-A1C)  Problem # 4:  BACK PAIN, CHRONIC (ICD-724.5) Assessment: Unchanged okay on methadone  Her updated medication list for this problem includes:    Methadone Hcl 10 Mg Tabs (Methadone hcl) .Marland Kitchen... 2 four times daily    Bayer Aspirin 325 Mg Tabs (Aspirin) .Marland Kitchen... 1 by mouth once daily  Complete Medication List: 1)  Mirtazapine 30 Mg Tabs (Mirtazapine) .... Take 1/2 - 1  tablet by mouth at bedtime 2)  Lisinopril 20 Mg  Tabs (Lisinopril) .... Take 1 tablet by mouth 3)  Methadone Hcl 10 Mg Tabs (Methadone hcl) .... 2 four times daily 4)  Amaryl 4 Mg Tabs (Glimepiride) .... Take 1 tablet by mouth once a day 5)  Demadex 20 Mg Tabs (Torsemide) .... 2 two times a day 6)  Glucophage 500 Mg Tabs (Metformin hcl) .... Take one tablet two times a day 7)  Neurontin 300 Mg Caps (Gabapentin) .... Take 1 capsule two times a day then 2 at bedtime 8)  Lovastatin 40 Mg Tabs (Lovastatin) .... 2 daily 9)  Loratadine 10 Mg Tabs (Loratadine) .... Once daily as needed 10)  Bayer Aspirin 325 Mg Tabs (Aspirin) .Marland Kitchen.. 1 by mouth once daily 11)  Metoprolol Tartrate 25 Mg Tabs (Metoprolol tartrate) .Marland Kitchen.. 1 two times a  day 12)  Alprazolam 0.25 Mg Tabs (Alprazolam) .Marland Kitchen.. 1 or 2 tablets three times a day  by mouth 13)  Pantoprazole Sodium 40 Mg Tbec (Pantoprazole sodium) .... Take one by mouth two times a day 14)  Nitroglycerin 0.4 Mg Subl (Nitroglycerin) .Marland Kitchen.. 1 under tongue as needed for chest pain 15)  Metformin Hcl 1000 Mg Tabs (Metformin hcl) .Marland Kitchen.. 1 tab by mouth two times a day  Patient Instructions: 1)  Please try only 1/2 of the mirtazapine at bedtime to see if that helps your sleep more 2)  Please try the metformin at 1057m two times a day. If diarrhea starts, call and we will increase the amaryl instead 3)  Please schedule a follow-up appointment in 4 months .   Current Allergies (reviewed today): No known allergies

## 2010-11-27 NOTE — Progress Notes (Signed)
Summary: refill request for alprazolam  Phone Note Refill Request Message from:  Fax from Pharmacy  Refills Requested: Medication #1:  ALPRAZOLAM 0.25 MG TABS 1 or 2 tablets three times a day  by mouth   Last Refilled: 11/16/2009 Faxed request from [facility] is on your desk.  Initial call taken by: Marty Heck CMA,  February 13, 2010 12:30 PM  Follow-up for Phone Call        okay #180 x 0 Follow-up by: Claris Gower MD,  February 13, 2010 1:52 PM  Additional Follow-up for Phone Call Additional follow up Details #1::        Rx faxed to pharmacy Additional Follow-up by: DeShannon Smith CMA Deborra Medina),  February 13, 2010 3:31 PM    Prescriptions: ALPRAZOLAM 0.25 MG TABS (ALPRAZOLAM) 1 or 2 tablets three times a day  by mouth  #180 x 0   Entered by:   Edwin Dada CMA ([place])   Authorized by:   Claris Gower MD   Signed by:   Edwin Dada CMA ([place]) on 02/13/2010   Method used:   Handwritten   RxID:   [id number]

## 2010-11-27 NOTE — Progress Notes (Signed)
Summary: refill request for alprazolam  Phone Note Refill Request Message from:  Fax from Pharmacy  Refills Requested: Medication #1:  ALPRAZOLAM 0.25 MG TABS 1 or 2 tablets three times a day  by mouth   Last Refilled: 04/02/2010 Faxed reqeust from [facility], phone [phone].  Initial call taken by: Marty Heck CMA,  June 08, 2010 12:46 PM  Follow-up for Phone Call        okay #180 x 0 No fax on my desk--can phone in Follow-up by: Claris Gower MD,  June 08, 2010 1:21 PM  Additional Follow-up for Phone Call Additional follow up Details #1::        Rx called to pharmacy Additional Follow-up by: DeShannon Tamala Julian CMA Deborra Medina),  June 08, 2010 2:52 PM    Prescriptions: ALPRAZOLAM 0.25 MG TABS (ALPRAZOLAM) 1 or 2 tablets three times a day  by mouth  #180 x 0   Entered by:   Edwin Dada CMA ([place])   Authorized by:   Claris Gower MD   Signed by:   Edwin Dada CMA ([place]) on 06/08/2010   Method used:   Telephoned to ...       [facility] (retail)       [place], [place]  [id number]       Ph: [phone]       Fax: [phone]   RxID:   [id number]

## 2010-11-27 NOTE — Progress Notes (Signed)
Summary: needs refill on methadone  Phone Note Refill Request Call back at Home Phone [phone] Message from:  Patient  Refills Requested: Medication #1:  METHADONE HCL 10 MG TABS 2 four times daily Please call pt when ready.  Initial call taken by: Marty Heck CMA,  August 14, 2010 3:10 PM  Follow-up for Phone Call        Rx written Follow-up by: Claris Gower MD,  August 15, 2010 7:57 AM  Additional Follow-up for Phone Call Additional follow up Details #1::        Spoke with patient and advised rx ready for pick-up  Additional Follow-up by: Edwin Dada CMA Deborra Medina),  August 15, 2010 8:39 AM    Prescriptions: METHADONE HCL 10 MG TABS (METHADONE HCL) 2 four times daily  #240 x 0   Entered and Authorized by:   Claris Gower MD   Signed by:   Claris Gower MD on 08/15/2010   Method used:   Print then Give to Patient   RxID:   [id number]

## 2010-11-27 NOTE — Progress Notes (Signed)
Summary: refill request for methadone  Phone Note Refill Request Call back at Home Phone [phone] Message from:  Patient  Refills Requested: Medication #1:  METHADONE HCL 10 MG TABS 2 four times daily Please call pt when ready.  Initial call taken by: Marty Heck CMA,  May 18, 2010 8:58 AM  Follow-up for Phone Call        Rx written  Pt. called and notified of same. [place]  May 18, 2010 9:33 AM  Follow-up by: Claris Gower MD,  May 18, 2010 9:05 AM    Prescriptions: METHADONE HCL 10 MG TABS (METHADONE HCL) 2 four times daily  #240 x 0   Entered and Authorized by:   Claris Gower MD   Signed by:   Claris Gower MD on 05/18/2010   Method used:   Print then Give to Patient   RxID:   [id number]

## 2010-11-27 NOTE — Progress Notes (Signed)
Summary: refill request for methadone  Phone Note Refill Request Call back at Home Phone [phone] Message from:  Patient  Refills Requested: Medication #1:  METHADONE HCL 10 MG TABS 2 four times daily Please call when ready, she is ok to wait until monday.  Initial call taken by: Marty Heck CMA,  July 13, 2010 2:23 PM  Follow-up for Phone Call        Rx written Follow-up by: Claris Gower MD,  July 16, 2010 1:39 PM  Additional Follow-up for Phone Call Additional follow up Details #1::        Spoke with patient and advised rx ready for pick-up  DeShannon Smith CMA ([place])  July 16, 2010 2:27 PM     Prescriptions: METHADONE HCL 10 MG TABS (METHADONE HCL) 2 four times daily  #240 x 0   Entered and Authorized by:   Claris Gower MD   Signed by:   Claris Gower MD on 07/16/2010   Method used:   Print then Give to Patient   RxID:   [id number]

## 2010-11-27 NOTE — Assessment & Plan Note (Signed)
Summary: SINUS INFECTION   Vital Signs:  Patient profile:   55 year old female Weight:      168.25 pounds Temp:     98.5 degrees F oral Pulse rate:   76 / minute Pulse rhythm:   regular BP sitting:   124 / 60  (left arm) Cuff size:   large  Vitals Entered By: Emelia Salisbury LPN (April 17, 6332 5:45 PM) CC: ? Sinus infection, facial pressure, headache, head congesiton/yellow/white   History of Present Illness: Pt here for congestion. She had mucous from the nose which is now creamy looking. She also has pressure in the max areas when she leans forward.  She has not had fever or chills. She has headache left frontal area (a week or so), mild left ear pain, rhinitis that is now creamy, left facial area sore, no ST, mild cough with phlegm draining. No SOB, no N/V. She has taken Loratadine two times a day and other usual medications.  Allergies: No Known Drug Allergies  Physical Exam  General:  Well-developed,well-nourished,in no acute distress; alert,appropriate and cooperative throughout examination, minimally congested. Head:  Normocephalic and atraumatic without obvious abnormalities. No apparent alopecia or balding. Sinuses mildly tender in max distribution on left. Eyes:  Conjunctiva clear bilaterally.  Ears:  External ear exam shows no significant lesions or deformities.  Otoscopic examination reveals clear canals, tympanic membranes are intact bilaterally without bulging, retraction, inflammation or discharge. Hearing is grossly normal bilaterally. Nose:  External nasal examination shows no deformity or inflammation. Nasal mucosa are pink and moist without lesions or exudates. Septum dev lweft with miold restriction. Mouth:  Oral mucosa and oropharynx without lesions or exudates.  Teeth in good repair. Mild tan PND. Neck:  supple, no masses, no thyromegaly, no carotid bruits, and no cervical lymphadenopathy.   Chest Wall:  No deformities, masses, or tenderness noted. Lungs:  Normal  respiratory effort, chest expands symmetrically. Lungs are clear to auscultation, no crackles or wheezes. Heart:  Normal rate and regular rhythm. S1 and S2 normal without gallop, murmur, click, rub or other extra sounds.   Impression & Recommendations:  Problem # 1:  URI (ICD-465.9) Assessment New  See instructuions. Couild turn into sinus infection easily if we are not successful at getting out discharge. Her updated medication list for this problem includes:    Loratadine 10 Mg Tabs (Loratadine) ..... Once daily as needed    Bayer Aspirin 325 Mg Tabs (Aspirin) .Marland Kitchen... 1 by mouth once daily    Tessalon 200 Mg Caps (Benzonatate) ..... One tab by mouth three times a day as needed for cough  Instructed on symptomatic treatment. Call if symptoms persist or worsen.   Complete Medication List: 1)  Metformin Hcl 1000 Mg Tabs (Metformin hcl) .Marland Kitchen.. 1 tab by mouth two times a day 2)  Mirtazapine 30 Mg Tabs (Mirtazapine) .... Take 1/2 - 1  tablet by mouth at bedtime 3)  Lisinopril 20 Mg Tabs (Lisinopril) .... Take 1 tablet by mouth 4)  Methadone Hcl 10 Mg Tabs (Methadone hcl) .... 2 four times daily 5)  Amaryl 4 Mg Tabs (Glimepiride) .... Take 1 by mouth two times a day 6)  Demadex 20 Mg Tabs (Torsemide) .... 2 two times a day 7)  Glucophage 500 Mg Tabs (Metformin hcl) .... Take one tablet two times a day 8)  Neurontin 300 Mg Caps (Gabapentin) .... Take 1 capsule two times a day then 2 at bedtime 9)  Lovastatin 40 Mg Tabs (Lovastatin) .... 2 daily  10)  Loratadine 10 Mg Tabs (Loratadine) .... Once daily as needed 11)  Bayer Aspirin 325 Mg Tabs (Aspirin) .Marland Kitchen.. 1 by mouth once daily 12)  Metoprolol Tartrate 25 Mg Tabs (Metoprolol tartrate) .Marland Kitchen.. 1 two times a day 13)  Alprazolam 0.25 Mg Tabs (Alprazolam) .Marland Kitchen.. 1 or 2 tablets three times a day  by mouth 14)  Pantoprazole Sodium 40 Mg Tbec (Pantoprazole sodium) .... Take one by mouth two times a day 15)  Nitroglycerin 0.4 Mg Subl (Nitroglycerin) .Marland Kitchen.. 1  under tongue as needed for chest pain 16)  Amoxicillin 500 Mg Caps (Amoxicillin) .... 2 tabs by mouth two times a day 17)  Tessalon 200 Mg Caps (Benzonatate) .... One tab by mouth three times a day as needed for cough  Patient Instructions: 1)  Take Guaifenesin by going to [facility] or [facility] and getting MUCOUS RELIEF EXPECTORANT (429m), take 11/2 tabs by mouth AM and NOON. 2)  Drink lots of fluids anytime taking Guaifenesin.  3)  Take Tyl ES 2 tabs by mouth three times a day. 4)  If cough develops, keep lozenge in mouth. 5)  If worsens, use Tessalon for mild suppression. 6)  If discharge and sinus congestion worsens, cont all above but add Amox 500 for 2 weeks. Prescriptions: TESSALON 200 MG CAPS (BENZONATATE) one tab by mouth three times a day as needed for cough  #20 x 0   Entered and Authorized by:   RRaenette RoverMD   Signed by:   RRaenette RoverMD on 04/17/2010   Method used:   Print then Give to Patient   RxID:   [id number] 500 MG CAPS (AMOXICILLIN) 2 tabs by mouth two times a day  #56 x 0   Entered and Authorized by:   RRaenette RoverMD   Signed by:   RRaenette RoverMD on 04/17/2010   Method used:   Print then Give to Patient   RxID:   [id number]  Current Allergies (reviewed today): No known allergies

## 2010-11-27 NOTE — Letter (Signed)
Summary: Results Follow up Letter  [facility]  [address] [place], [place] [address]   Phone: [phone]  Fax: [phone]    08/13/2010 MRN: [id number]  Lindsay Harmon [address] [place], [place]  [address]  Dear [name],  The following are the results of your recent test(s):  Test         Result    Pap Smear:        Normal _____  Not Normal _____ Comments: ______________________________________________________ Cholesterol: LDL(Bad cholesterol):         Your goal is less than:         HDL (Good cholesterol):       Your goal is more than: Comments:  ______________________________________________________ Mammogram:        Normal __X___  Not Normal _____ Comments:mammo is fine repeat recommended in 1-2 years  ___________________________________________________________________ Hemoccult:        Normal _____  Not normal _______ Comments:    _____________________________________________________________________ Other Tests:    We routinely do not discuss normal results over the telephone.  If you desire a copy of the results, or you have any questions about this information we can discuss them at your next office visit.   Sincerely,       Viviana Simpler, MD

## 2010-11-27 NOTE — Progress Notes (Signed)
Summary: refill request for methadone  Phone Note Refill Request Call back at Home Phone [phone] Message from:  Patient  Refills Requested: Medication #1:  METHADONE HCL 10 MG TABS 2 four times daily Please call when ready.  Initial call taken by: Marty Heck CMA,  June 14, 2010 10:42 AM  Follow-up for Phone Call        Rx written Follow-up by: Claris Gower MD,  June 14, 2010 1:46 PM  Additional Follow-up for Phone Call Additional follow up Details #1::        Spoke with patient and advised rx ready for pick-up  Additional Follow-up by: Edwin Dada CMA Deborra Medina),  June 14, 2010 2:21 PM    Prescriptions: METHADONE HCL 10 MG TABS (METHADONE HCL) 2 four times daily  #240 x 0   Entered and Authorized by:   Claris Gower MD   Signed by:   Claris Gower MD on 06/14/2010   Method used:   Print then Give to Patient   RxID:   [id number]

## 2010-11-27 NOTE — Assessment & Plan Note (Signed)
Summary: 4 MONTH FOLLOW UP/[facility]   Vital Signs:  Patient profile:   55 year old female Weight:      160 pounds Temp:     98.4 degrees F oral Pulse rate:   72 / minute Pulse rhythm:   regular BP sitting:   110 / 60  (left arm) Cuff size:   regular  Vitals Entered By: Edwin Dada CMA Deborra Medina) (July 31, 2010 9:19 AM) CC: 4 month follow-up   History of Present Illness: Doing well No new concerns  Arthritis pain is well controlled on methadone Careful with her prescription--discussed safety  No heart troubles No angina since last visit No chest pain No SOB No set exercise---does do instrumental ADLs though  Checks sugars every other day or so Run 90s to 120 No hypoglycemic reactions  Energy levels are stable  Allergies: No Known Drug Allergies  Past History:  Past medical, surgical, family and social histories (including risk factors) reviewed for relevance to current acute and chronic problems.  Past Medical History: Reviewed history from 08/26/2008 and no changes required. Depression GERD/Barretts 02/2004---------------------------------------------Dr Carlean Purl Hyperlipidemia Hypertension Peripheral vascular disease Coronary artery disease Familial hematuria NIDDM with neuropathy--------------------------------------------[place] [facility] Irritable Bowel Syndrome Esophageal Stricture Obesity Nephrolithiasis Allergic rhinosinusitis Osteoarthritis  Past Surgical History: Reviewed history from 09/16/2007 and no changes required. Vaginal hysterctomy/ovaries out  [place] Bilateral carpal tunnel release  1998 L CEA    12/1997 Cardiolyte neg, ef 66%  3/00 Cath-subcritical 3 vessel cad 4/99 ABIi`s mild decrease 6/04 EGD--Barretts unchanged 9/07  Family History: Reviewed history from 01/23/2009 and no changes required. Family History of Ovarian Cancer: Mother Family History of Diabetes: Mother, Grandmother No FH of Colon Cancer: Thyroid cancer:  maternal Uncle Melonoma: mother Uterine cancer: Maternal granmother Lung cancer: Father Hematuria in many family members  Social History: Reviewed history from 09/16/2007 and no changes required. 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   Neck:  supple, no masses, no thyromegaly, no carotid bruits, and no cervical lymphadenopathy.   Lungs:  normal respiratory effort, no intercostal retractions, no accessory muscle use, and normal breath sounds.   Heart:  normal rate, regular rhythm, no murmur, and no gallop.   Pulses:  faint in feet Extremities:  no edema Neurologic:  alert & oriented X3, strength normal in all extremities, and sensation intact to pinprick.   Skin:  no suspicious lesions and no ulcerations.   Psych:  normally interactive, good eye contact, not anxious appearing, and not depressed appearing.    Diabetes Management Exam:    Foot Exam (with socks and/or shoes not present):       Sensory-Pinprick/Light touch:          Left medial foot (L-4): diminished          Left dorsal foot (L-5): diminished          Left lateral foot (S-1): diminished          Right medial foot (L-4): diminished          Right dorsal foot (L-5): diminished          Right lateral foot (S-1): diminished       Inspection:  Left foot: normal          Right foot: normal       Nails:          Left foot: thickened          Right foot: thickened   Impression & Recommendations:  Problem # 1:  DIABETES MELLITUS, TYPE II, WITH NEUROLOGICAL COMPLICATIONS (MOQ-947.65) Assessment Unchanged  seems to be fine will check A1c  Her updated medication list for this problem includes:    Metformin Hcl 1000 Mg Tabs (Metformin hcl) .Marland Kitchen... 1 tab  by mouth two times a day    Lisinopril 20 Mg Tabs (Lisinopril) .Marland Kitchen... Take 1 tablet by mouth    Amaryl 4 Mg Tabs (Glimepiride) .Marland Kitchen... Take 1 by mouth two times a day    Glucophage 500 Mg Tabs (Metformin hcl) .Marland Kitchen... Take one tablet two times a day    Bayer Aspirin 325 Mg Tabs (Aspirin) .Marland Kitchen... 1 by mouth once daily  Labs Reviewed: Creat: 1.2 (03/28/2010)     Last Eye Exam: No diabetic retinopathy.    (04/25/2010) Reviewed HgBA1c results: 7.9 (03/28/2010)  9.9 (11/27/2009)  Orders: TLB-A1C / Hgb A1C (Glycohemoglobin) (83036-A1C) Venipuncture (46503)  Problem # 2:  BACK PAIN, CHRONIC (ICD-724.5) Assessment: Unchanged controlled on stable dose of methadone  Her updated medication list for this problem includes:    Methadone Hcl 10 Mg Tabs (Methadone hcl) .Marland Kitchen... 2 four times daily    Bayer Aspirin 325 Mg Tabs (Aspirin) .Marland Kitchen... 1 by mouth once daily  Problem # 3:  UNSPECIFIED ANEMIA (ICD-285.9) Assessment: Improved  will recheck off the iron  Orders: TLB-CBC Platelet - w/Differential (85025-CBCD)  Problem # 4:  DEPRESSION (ICD-311) Assessment: Unchanged mood fine on current meds  Her updated medication list for this problem includes:    Mirtazapine 30 Mg Tabs (Mirtazapine) .Marland Kitchen... Take 1/2 - 1  tablet by mouth at bedtime    Alprazolam 0.25 Mg Tabs (Alprazolam) .Marland Kitchen... 1 or 2 tablets three times a day  by mouth  Problem # 5:  HYPERTENSION (ICD-401.9) Assessment: Unchanged good control  Her updated medication list for this problem includes:    Lisinopril 20 Mg Tabs (Lisinopril) .Marland Kitchen... Take 1 tablet by mouth    Demadex 20 Mg Tabs (Torsemide) .Marland Kitchen... 2 two times a day    Metoprolol Tartrate 25 Mg Tabs (Metoprolol tartrate) .Marland Kitchen... 1 two times a day  BP today: 110/60 Prior BP: 124/60 (04/17/2010)  Labs Reviewed: K+: 4.3 (03/28/2010) Creat: : 1.2 (03/28/2010)   Chol: 150 (03/28/2010)   HDL: 32.70 (03/28/2010)   LDL: 82 (09/26/2008)   TG: 226.0 (03/28/2010)  Complete Medication List: 1)   Metformin Hcl 1000 Mg Tabs (Metformin hcl) .Marland Kitchen.. 1 tab by mouth two times a day 2)  Mirtazapine 30 Mg Tabs (Mirtazapine) .... Take 1/2 - 1  tablet by mouth at bedtime 3)  Lisinopril 20 Mg Tabs (Lisinopril) .... Take 1 tablet by mouth 4)  Methadone Hcl 10 Mg Tabs (Methadone hcl) .... 2 four times daily 5)  Amaryl 4 Mg Tabs (Glimepiride) .... Take 1 by mouth two times a day 6)  Demadex 20 Mg Tabs (Torsemide) .... 2 two times a day 7)  Glucophage 500 Mg Tabs (Metformin hcl) .... Take one tablet two times a day 8)  Neurontin 300 Mg Caps (Gabapentin) .... Take 1 capsule two times a day then 2 at bedtime 9)  Lovastatin 40 Mg Tabs (Lovastatin) .... 2 daily 10)  Metoprolol Tartrate 25 Mg Tabs (Metoprolol tartrate) .Marland KitchenMarland KitchenMarland Kitchen 1  two times a day 11)  Alprazolam 0.25 Mg Tabs (Alprazolam) .Marland Kitchen.. 1 or 2 tablets three times a day  by mouth 12)  Pantoprazole Sodium 40 Mg Tbec (Pantoprazole sodium) .... Take one by mouth two times a day 13)  Nitroglycerin 0.4 Mg Subl (Nitroglycerin) .Marland Kitchen.. 1 under tongue as needed for chest pain 14)  Loratadine 10 Mg Tabs (Loratadine) .... Once daily as needed 15)  Bayer Aspirin 325 Mg Tabs (Aspirin) .Marland Kitchen.. 1 by mouth once daily  Other Orders: Flu Vaccine 97yr + MEDICARE PATIENTS ((Q6047 Administration Flu vaccine - MCR ((V9872 Radiology Referral (Radiology)  Patient Instructions: 1)  Please schedule a follow-up appointment in 4 months .  2)  Schedule your mammogram.   Current Allergies (reviewed today): No known allergies    Influenza Vaccine    Vaccine Type: Fluvax MCR    Site: left deltoid    Mfr: GlaxoSmithKline    Dose: 0.5 ml    Route: IM    Given by: DEdwin DadaCMA ([place]    Exp. Date: 04/27/2011    Lot #: [id number]   VIS given: 05/22/10 version given July 31, 2010.  Flu Vaccine Consent Questions    Do you have a history of severe allergic reactions to this vaccine? no    Any prior history of allergic reactions to egg and/or gelatin? no    Do you have a  sensitivity to the preservative Thimersol? no    Do you have a past history of Guillan-Barre Syndrome? no    Do you currently have an acute febrile illness? no    Have you ever had a severe reaction to latex? no    Vaccine information given and explained to patient? yes    Are you currently pregnant? no

## 2010-11-27 NOTE — Progress Notes (Signed)
Summary: Rx Alprazolam  Phone Note Refill Request Call back at [phone] Message from:  Asher-McAdams on November 15, 2009 3:55 PM  Refills Requested: Medication #1:  ALPRAZOLAM 0.25 MG TABS 1 or 2 tablets three times a day  by mouth   Last Refilled: 09/07/2009 Received faxed refill request, please advise   Method Requested: Telephone to Pharmacy Initial call taken by: Sherrian Divers CMA Deborra Medina),  November 15, 2009 3:55 PM  Follow-up for Phone Call        okay #180 x 0 Follow-up by: Claris Gower MD,  November 16, 2009 12:54 PM  Additional Follow-up for Phone Call Additional follow up Details #1::        Rx called to pharmacy Additional Follow-up by: DeShannon Smith CMA Deborra Medina),  November 16, 2009 3:24 PM    Prescriptions: ALPRAZOLAM 0.25 MG TABS (ALPRAZOLAM) 1 or 2 tablets three times a day  by mouth  #180 x 0   Entered by:   Edwin Dada CMA ([place])   Authorized by:   Claris Gower MD   Signed by:   Edwin Dada CMA ([place]) on 11/16/2009   Method used:   Telephoned to ...       [facility] (retail)       [place], [place]  [id number]       Ph: [phone]       Fax: [phone]   RxID:   [id number]

## 2010-11-27 NOTE — Progress Notes (Signed)
Summary: refill request for methadone  Phone Note Refill Request Call back at Home Phone [phone] Message from:  Patient  Refills Requested: Medication #1:  METHADONE HCL 10 MG TABS 2 four times daily Please call pt when ready.  Initial call taken by: Marty Heck CMA,  January 10, 2010 1:20 PM  Follow-up for Phone Call        Rx written Follow-up by: Claris Gower MD,  January 10, 2010 1:54 PM  Additional Follow-up for Phone Call Additional follow up Details #1::        Spoke with patient and advised rx ready for pick-up  Additional Follow-up by: Edwin Dada CMA Deborra Medina),  January 10, 2010 2:59 PM    New/Updated Medications: METHADONE HCL 10 MG TABS (METHADONE HCL) 2 four times daily Prescriptions: METHADONE HCL 10 MG TABS (METHADONE HCL) 2 four times daily  #240 x 0   Entered and Authorized by:   Claris Gower MD   Signed by:   Claris Gower MD on 01/10/2010   Method used:   Print then Give to Patient   RxID:   [id number]

## 2010-11-27 NOTE — Miscellaneous (Signed)
Summary: Controlled Substance Agreement  Controlled Substance Agreement   Imported By: Edmonia James 04/03/2010 09:59:27  _____________________________________________________________________  External Attachment:    Type:   Image     Comment:   External Document

## 2010-11-27 NOTE — Progress Notes (Signed)
Summary: RX Methadone  Phone Note Call from Patient   Caller: Patient Call For: Claris Gower MD Complaint: Abdominal Pain Summary of Call: Patient called to request a rx for her Methadone. Call when ready for pickup. Initial call taken by: Emelia Salisbury LPN,  November 08, 8370 12:26 PM  Follow-up for Phone Call        Rx written Follow-up by: Claris Gower MD,  November 08, 2009 1:36 PM  Additional Follow-up for Phone Call Additional follow up Details #1::        Spoke with patient and advised rx ready for pick-up  Additional Follow-up by: Edwin Dada CMA Deborra Medina),  November 08, 2009 2:44 PM    New/Updated Medications: METHADONE HCL 10 MG TABS (METHADONE HCL) 2 four times daily Prescriptions: METHADONE HCL 10 MG TABS (METHADONE HCL) 2 four times daily  #240 x 0   Entered and Authorized by:   Claris Gower MD   Signed by:   Claris Gower MD on 11/08/2009   Method used:   Print then Give to Patient   RxID:   [id number]

## 2010-11-27 NOTE — Progress Notes (Signed)
Summary: refill request for methadone  Phone Note Refill Request Call back at Home Phone [phone] Message from:  Patient  Refills Requested: Medication #1:  METHADONE HCL 10 MG TABS 2 four times daily Phoned request from pt, please call when ready.  Initial call taken by: Marty Heck CMA,  February 12, 2010 11:32 AM  Follow-up for Phone Call        Rx written Follow-up by: Claris Gower MD,  February 12, 2010 1:10 PM  Additional Follow-up for Phone Call Additional follow up Details #1::        Spoke with patient and advised rx ready for pick-up  Additional Follow-up by: Edwin Dada CMA Deborra Medina),  February 12, 2010 2:14 PM    Prescriptions: METHADONE HCL 10 MG TABS (METHADONE HCL) 2 four times daily  #240 x 0   Entered and Authorized by:   Claris Gower MD   Signed by:   Claris Gower MD on 02/12/2010   Method used:   Print then Give to Patient   RxID:   [id number]

## 2010-11-27 NOTE — Progress Notes (Signed)
Summary: needs refill on methadone  Phone Note Refill Request Call back at Home Phone [phone] Message from:  Patient  Refills Requested: Medication #1:  METHADONE HCL 10 MG TABS 2 four times daily Please call when ready.  Initial call taken by: Marty Heck CMA,  Mar 14, 2010 12:59 PM  Follow-up for Phone Call        Rx written Follow-up by: Claris Gower MD,  Mar 14, 2010 1:32 PM  Additional Follow-up for Phone Call Additional follow up Details #1::        Spoke with patient and advised rx ready for pick-up  Additional Follow-up by: Edwin Dada CMA Deborra Medina),  Mar 14, 2010 2:59 PM    Prescriptions: METHADONE HCL 10 MG TABS (METHADONE HCL) 2 four times daily  #240 x 0   Entered and Authorized by:   Claris Gower MD   Signed by:   Claris Gower MD on 03/14/2010   Method used:   Print then Give to Patient   RxID:   [id number]

## 2010-11-27 NOTE — Progress Notes (Signed)
Summary: refill request for methadone  Phone Note Refill Request Call back at Home Phone [phone] Message from:  Patient  Refills Requested: Medication #1:  METHADONE HCL 10 MG TABS 2 four times daily  Medication #2:  MIRTAZAPINE 30 MG TABS Take 1/2 - 1  tablet by mouth at bedtime Please call when ready.  Initial call taken by: Marty Heck CMA,  April 11, 2010 3:37 PM  Follow-up for Phone Call        Rx written Follow-up by: Claris Gower MD,  April 12, 2010 12:53 PM  Additional Follow-up for Phone Call Additional follow up Details #1::        Patient notified as instructed by telephone. Prescription left at front desk. Ozzie Hoyle LPN  April 13, 5319 2:33 PM     New/Updated Medications: MIRTAZAPINE 30 MG TABS (MIRTAZAPINE) Take 1/2 - 1  tablet by mouth at bedtime Prescriptions: MIRTAZAPINE 30 MG TABS (MIRTAZAPINE) Take 1/2 - 1  tablet by mouth at bedtime  #30 x 12   Entered and Authorized by:   Claris Gower MD   Signed by:   Claris Gower MD on 04/12/2010   Method used:   Print then Give to Patient   RxID:   [id number] METHADONE HCL 10 MG TABS (METHADONE HCL) 2 four times daily  #240 x 0   Entered and Authorized by:   Claris Gower MD   Signed by:   Claris Gower MD on 04/12/2010   Method used:   Print then Give to Patient   RxID:   [id number]

## 2010-11-27 NOTE — Progress Notes (Signed)
Summary: refill request for methadone  Phone Note Refill Request Call back at Home Phone [phone] Message from:  Patient  Refills Requested: Medication #1:  METHADONE HCL 10 MG TABS 2 four times daily Please call pt when ready.  Initial call taken by: Marty Heck CMA,  December 11, 2009 11:02 AM  Follow-up for Phone Call        Rx written Follow-up by: Claris Gower MD,  December 11, 2009 1:01 PM  Additional Follow-up for Phone Call Additional follow up Details #1::        Spoke with patient and advised rx ready for pick-up  Additional Follow-up by: Edwin Dada CMA Deborra Medina),  December 11, 2009 1:05 PM    New/Updated Medications: METHADONE HCL 10 MG TABS (METHADONE HCL) 2 four times daily Prescriptions: METHADONE HCL 10 MG TABS (METHADONE HCL) 2 four times daily  #240 x 0   Entered and Authorized by:   Claris Gower MD   Signed by:   Claris Gower MD on 12/11/2009   Method used:   Print then Give to Patient   RxID:   [id number]

## 2010-11-27 NOTE — Progress Notes (Signed)
Summary: refill request for alprazolam  Phone Note Refill Request Message from:  Fax from Pharmacy  Refills Requested: Medication #1:  ALPRAZOLAM 0.25 MG TABS 1 or 2 tablets three times a day  by mouth   Last Refilled: 02/13/2010 Faxed request from [facility] is on your desk.  Initial call taken by: Marty Heck CMA,  April 02, 2010 3:11 PM  Follow-up for Phone Call        okay #180 x 0 Follow-up by: Claris Gower MD,  April 02, 2010 5:49 PM  Additional Follow-up for Phone Call Additional follow up Details #1::        Rx faxed to pharmacy Additional Follow-up by: DeShannon Smith CMA Deborra Medina),  April 03, 2010 8:39 AM    Prescriptions: ALPRAZOLAM 0.25 MG TABS (ALPRAZOLAM) 1 or 2 tablets three times a day  by mouth  #180 x 0   Entered by:   Edwin Dada CMA ([place])   Authorized by:   Claris Gower MD   Signed by:   Edwin Dada CMA ([place]) on 04/03/2010   Method used:   Handwritten   RxID:   [id number]

## 2010-11-29 NOTE — Progress Notes (Signed)
Summary: refill request for methadone  Phone Note Refill Request Call back at Home Phone [phone] Message from:  Patient  Refills Requested: Medication #1:  METHADONE HCL 10 MG TABS 2 four times daily Please call pt when ready- Dr. Alla German pt.  Initial call taken by: Marty Heck CMA, AAMA,  October 12, 2010 11:11 AM Caller: Patient  Follow-up for Phone Call        has been receiving 240# monthly.  refilled in Dr. Alla German absence.  call to notify script ready. Follow-up by: Ria Bush  MD,  October 12, 2010 11:31 AM  Additional Follow-up for Phone Call Additional follow up Details #1::        Patient aware and Rx up front for pick up. Additional Follow-up by: Arnetha Courser CMA Deborra Medina),  October 12, 2010 1:16 PM    Prescriptions: METHADONE HCL 10 MG TABS (METHADONE HCL) 2 four times daily  #240 x 0   Entered and Authorized by:   Ria Bush  MD   Signed by:   Ria Bush  MD on 10/12/2010   Method used:   Print then Give to Patient   RxID:   [id number]

## 2010-11-29 NOTE — Progress Notes (Signed)
Summary: alprazolam   Phone Note Refill Request Message from:  Fax from Pharmacy on October 15, 2010 10:03 AM  Refills Requested: Medication #1:  ALPRAZOLAM 0.25 MG TABS 1 or 2 tablets three times a day  by mouth   Last Refilled: 06/08/2010 Refill request from [email or web address]. Fax is on your desk.     Initial call taken by: Lacretia Nicks,  October 15, 2010 10:03 AM  Follow-up for Phone Call        okay #180 x 0 Follow-up by: Claris Gower MD,  October 15, 2010 1:26 PM  Additional Follow-up for Phone Call Additional follow up Details #1::        Rx faxed to pharmacy Additional Follow-up by: DeShannon Smith CMA Deborra Medina),  October 15, 2010 2:05 PM    Prescriptions: ALPRAZOLAM 0.25 MG TABS (ALPRAZOLAM) 1 or 2 tablets three times a day  by mouth  #180 x 0   Entered by:   Edwin Dada CMA ([place])   Authorized by:   Claris Gower MD   Signed by:   Edwin Dada CMA ([place]) on 10/15/2010   Method used:   Handwritten   RxID:   [id number]

## 2010-11-29 NOTE — Progress Notes (Signed)
Summary: prior Josem Kaufmann is needed for pantoprazole  Phone Note Other Incoming   Caller: Actor of Call: [employer] auth is needed for pantoprazole, form is on your desk. Initial call taken by: Marty Heck CMA, AAMA,  November 20, 2010 9:28 AM  Follow-up for Phone Call        form done Follow-up by: Claris Gower MD,  November 20, 2010 2:05 PM  Additional Follow-up for Phone Call Additional follow up Details #1::        form faxed and scanned Additional Follow-up by: Edwin Dada CMA Deborra Medina),  November 20, 2010 2:17 PM

## 2010-11-29 NOTE — Progress Notes (Signed)
Summary: methadone  Phone Note Refill Request Call back at Home Phone [phone] Message from:  Fax from Pharmacy on November 15, 2010 1:42 PM  Refills Requested: Medication #1:  METHADONE HCL 10 MG TABS 2 four times daily  Method Requested: Pick up at Office Initial call taken by: Lacretia Nicks,  November 15, 2010 1:42 PM  Follow-up for Phone Call        Rx written Follow-up by: Claris Gower MD,  November 15, 2010 3:10 PM  Additional Follow-up for Phone Call Additional follow up Details #1::        Spoke with patient and advised rx ready for pick-up  Additional Follow-up by: Edwin Dada CMA Deborra Medina),  November 15, 2010 3:22 PM    Prescriptions: METHADONE HCL 10 MG TABS (METHADONE HCL) 2 four times daily  #240 x 0   Entered and Authorized by:   Claris Gower MD   Signed by:   Claris Gower MD on 11/15/2010   Method used:   Print then Give to Patient   RxID:   [id number]

## 2010-12-04 ENCOUNTER — Ambulatory Visit (INDEPENDENT_AMBULATORY_CARE_PROVIDER_SITE_OTHER): Payer: Medicare Other | Admitting: Internal Medicine

## 2010-12-04 ENCOUNTER — Encounter: Payer: Self-pay | Admitting: Internal Medicine

## 2010-12-04 ENCOUNTER — Other Ambulatory Visit: Payer: Self-pay | Admitting: Internal Medicine

## 2010-12-04 DIAGNOSIS — E785 Hyperlipidemia, unspecified: Secondary | ICD-10-CM

## 2010-12-04 DIAGNOSIS — E1149 Type 2 diabetes mellitus with other diabetic neurological complication: Secondary | ICD-10-CM

## 2010-12-04 DIAGNOSIS — I10 Essential (primary) hypertension: Secondary | ICD-10-CM

## 2010-12-04 DIAGNOSIS — F411 Generalized anxiety disorder: Secondary | ICD-10-CM

## 2010-12-04 LAB — LDL CHOLESTEROL, DIRECT: Direct LDL: 103.2 mg/dL

## 2010-12-04 LAB — RENAL FUNCTION PANEL
BUN: 11 mg/dL (ref 6–23)
CO2: 33 mEq/L — ABNORMAL HIGH (ref 19–32)
Creatinine, Ser: 1.3 mg/dL — ABNORMAL HIGH (ref 0.4–1.2)
GFR: 46.95 mL/min — ABNORMAL LOW (ref >60.00)
Glucose, Bld: 98 mg/dL (ref 70–99)
Phosphorus: 3.2 mg/dL (ref 2.3–4.6)
Sodium: 139 mEq/L (ref 135–145)

## 2010-12-04 LAB — LIPID PANEL
Cholesterol: 166 mg/dL (ref 0–200)
HDL: 33.6 mg/dL — ABNORMAL LOW (ref >39.00)
Total CHOL/HDL Ratio: 5
Triglycerides: 264 mg/dL — ABNORMAL HIGH (ref 0.0–149.0)
VLDL: 52.8 mg/dL — ABNORMAL HIGH (ref 0.0–40.0)

## 2010-12-04 LAB — HEPATIC FUNCTION PANEL
ALT: 10 U/L (ref 0–35)
AST: 14 U/L (ref 0–37)
Albumin: 3.7 g/dL (ref 3.5–5.2)
Alkaline Phosphatase: 114 U/L (ref 39–117)
Total Protein: 7.4 g/dL (ref 6.0–8.3)

## 2010-12-04 LAB — HM DIABETES FOOT EXAM

## 2010-12-04 LAB — TSH: TSH: 1.41 u[IU]/mL (ref 0.35–5.50)

## 2010-12-04 LAB — HEMOGLOBIN A1C: Hgb A1c MFr Bld: 7.7 % — ABNORMAL HIGH (ref 4.6–6.5)

## 2010-12-05 NOTE — Medication Information (Signed)
Summary: Pantoprazole/[facility]  Pantoprazole/[facility]   Imported By: Phillis Knack 11/27/2010 14:33:01  _____________________________________________________________________  External Attachment:    Type:   Image     Comment:   External Document

## 2010-12-05 NOTE — Medication Information (Signed)
Summary: prior authorization  prior authorization   Imported By: Jamelle Haring 11/30/2010 09:10:31  _____________________________________________________________________  External Attachment:    Type:   Image     Comment:   External Document

## 2010-12-12 ENCOUNTER — Telehealth: Payer: Self-pay | Admitting: Internal Medicine

## 2010-12-13 NOTE — Assessment & Plan Note (Signed)
Summary: 4 MONTH F/U LFW  Medications Added ALPRAZOLAM 0.25 MG TABS (ALPRAZOLAM) 1  tablets three times a day  by mouth as needed for anxiety       Nurse Visit   Vital Signs:  Patient profile:   55 year old female Weight:      158 pounds BMI:     29.00 Temp:     98.5 degrees F oral Pulse rate:   82 / minute Pulse rhythm:   regular BP sitting:   125 / 61  (left arm) Cuff size:   regular  Vitals Entered By: Edwin Dada CMA Lindsay Harmon) (December 04, 2010 10:38 AM)  History of Present Illness: Doing fairly well  Has had a few hypoglycemic spells None severe but is noticeable No neuroglycopenia Has gone back up on metformin and amaryl No diarrhea Sugars have good--forgot list Checks most days  Arthritis pain is stable continues on the meds  continues on long time alprazolam she feels her life has smoothed out wonders about coming off it now has been down to 1 two times a day  discussed as needed use   No chest pain No SOB No sig edema   Review of Systems       being careful with eating weight is down 2# Occ sinus problems   Physical Exam  General:  alert and normal appearance.   Neck:  supple, no masses, no thyromegaly, no carotid bruits, and no cervical lymphadenopathy.   Lungs:  normal respiratory effort, no intercostal retractions, no accessory muscle use, and normal breath sounds.   Heart:  normal rate, regular rhythm, and no gallop.   Soft systolic murmur at left upper sternal border Pulses:  faint in feet Extremities:  no edema Psych:  normally interactive, good eye contact, not anxious appearing, and not depressed appearing.    Diabetes Management Exam:    Foot Exam (with socks and/or shoes not present):       Sensory-Pinprick/Light touch:          Left medial foot (L-4): diminished          Left dorsal foot (L-5): diminished          Left lateral foot (S-1): diminished          Right medial foot (L-4): diminished          Right dorsal foot  (L-5): diminished          Right lateral foot (S-1): diminished       Inspection:          Left foot: normal          Right foot: normal       Nails:          Left foot: thickened          Right foot: thickened   Impression & Recommendations:  Problem # 1:  DIABETES MELLITUS, TYPE II, WITH NEUROLOGICAL COMPLICATIONS (LJQ-492.01) Assessment Improved  control better occ mild hypoglycemic reactions no changes for now---will decrease amaryl if any more sig hypoglycemia  The following medications were removed from the medication list:    Glucophage 500 Mg Tabs (Metformin hcl) .Marland Kitchen... Take one tablet two times a day Her updated medication list for this problem includes:    Metformin Hcl 1000 Mg Tabs (Metformin hcl) .Marland Kitchen... 1 tab by mouth two times a day    Lisinopril 20 Mg Tabs (Lisinopril) .Marland Kitchen... Take 1 tablet by mouth    Amaryl 4 Mg Tabs (Glimepiride) .Marland KitchenMarland KitchenMarland KitchenMarland Kitchen  Take 1 by mouth two times a day    Bayer Aspirin 325 Mg Tabs (Aspirin) .Marland Kitchen... 1 by mouth once daily  Orders: TLB-A1C / Hgb A1C (Glycohemoglobin) (83036-A1C)  Problem # 2:  ANXIETY (ICD-300.00) Assessment: Improved wil use the xanax as needed only  Her updated medication list for this problem includes:    Mirtazapine 30 Mg Tabs (Mirtazapine) .Marland Kitchen... Take 1/2 - 1  tablet by mouth at bedtime    Alprazolam 0.25 Mg Tabs (Alprazolam) .Marland Kitchen... 1  tablets three times a day  by mouth as needed for anxiety  Problem # 3:  HYPERTENSION (ICD-401.9) Assessment: Unchanged  good control no changes needed  Her updated medication list for this problem includes:    Lisinopril 20 Mg Tabs (Lisinopril) .Marland Kitchen... Take 1 tablet by mouth    Demadex 20 Mg Tabs (Torsemide) .Marland Kitchen... 2 two times a day    Metoprolol Tartrate 25 Mg Tabs (Metoprolol tartrate) .Marland Kitchen... 1 two times a day  BP today: 125/61 Prior BP: 110/60 (07/31/2010)  Labs Reviewed: K+: 4.3 (03/28/2010) Creat: : 1.2 (03/28/2010)   Chol: 150 (03/28/2010)   HDL: 32.70 (03/28/2010)   LDL: 82  (09/26/2008)   TG: 226.0 (03/28/2010)  Orders: TLB-Renal Function Panel (80069-RENAL) TLB-TSH (Thyroid Stimulating Hormone) (84443-TSH)  Problem # 4:  HYPERLIPIDEMIA (ICD-272.4) Assessment: Unchanged  good control will check labs  Her updated medication list for this problem includes:    Lovastatin 40 Mg Tabs (Lovastatin) .Marland Kitchen... 2 daily  Labs Reviewed: SGOT: 19 (03/28/2010)   SGPT: 14 (03/28/2010)   HDL:32.70 (03/28/2010), 28.90 (07/05/2009)  LDL:82 (09/26/2008), 64 (01/18/2008)  Chol:150 (03/28/2010), 170 (07/05/2009)  Trig:226.0 (03/28/2010), 297.0 (07/05/2009)  Orders: TLB-Lipid Panel (80061-LIPID) TLB-Hepatic/Liver Function Pnl (80076-HEPATIC) Venipuncture (35686)  Problem # 5:  DEPRESSION (ICD-311) Assessment: Unchanged mood is good now continue the mirtazapine  Her updated medication list for this problem includes:    Mirtazapine 30 Mg Tabs (Mirtazapine) .Marland Kitchen... Take 1/2 - 1  tablet by mouth at bedtime    Alprazolam 0.25 Mg Tabs (Alprazolam) .Marland Kitchen... 1  tablets three times a day  by mouth as needed for anxiety  Complete Medication List: 1)  Metformin Hcl 1000 Mg Tabs (Metformin hcl) .Marland Kitchen.. 1 tab by mouth two times a day 2)  Mirtazapine 30 Mg Tabs (Mirtazapine) .... Take 1/2 - 1  tablet by mouth at bedtime 3)  Lisinopril 20 Mg Tabs (Lisinopril) .... Take 1 tablet by mouth 4)  Methadone Hcl 10 Mg Tabs (Methadone hcl) .... 2 four times daily 5)  Amaryl 4 Mg Tabs (Glimepiride) .... Take 1 by mouth two times a day 6)  Demadex 20 Mg Tabs (Torsemide) .... 2 two times a day 7)  Neurontin 300 Mg Caps (Gabapentin) .... Take 1 capsule two times a day then 2 at bedtime 8)  Lovastatin 40 Mg Tabs (Lovastatin) .... 2 daily 9)  Metoprolol Tartrate 25 Mg Tabs (Metoprolol tartrate) .Marland Kitchen.. 1 two times a day 10)  Alprazolam 0.25 Mg Tabs (Alprazolam) .Marland Kitchen.. 1  tablets three times a day  by mouth as needed for anxiety 11)  Pantoprazole Sodium 40 Mg Tbec (Pantoprazole sodium) .... Take one by mouth  two times a day 12)  Nitroglycerin 0.4 Mg Subl (Nitroglycerin) .Marland Kitchen.. 1 under tongue as needed for chest pain 13)  Loratadine 10 Mg Tabs (Loratadine) .... Once daily as needed 14)  Bayer Aspirin 325 Mg Tabs (Aspirin) .Marland Kitchen.. 1 by mouth once daily   Patient Instructions: 1)  Please schedule a follow-up appointment in 4 months .  Past History:  Past medical, surgical, family and social histories (including risk factors) reviewed for relevance to current acute and chronic problems.  Past Medical History: Reviewed history from 08/26/2008 and no changes required. Depression GERD/Barretts 02/2004---------------------------------------------Dr Carlean Purl Hyperlipidemia Hypertension Peripheral vascular disease Coronary artery disease Familial hematuria NIDDM with neuropathy-------------------------------------------- eye Irritable Bowel Syndrome Esophageal Stricture Obesity Nephrolithiasis Allergic rhinosinusitis Osteoarthritis  Past Surgical History: Reviewed history from 09/16/2007 and no changes required. Vaginal hysterctomy/ovaries out  Renovo Bilateral carpal tunnel release  1998 L CEA    12/1997 Cardiolyte neg, ef 66%  3/00 Cath-subcritical 3 vessel cad 4/99 ABIi`s mild decrease 6/04 EGD--Barretts unchanged 9/07  CC: 4 month follow-up   Allergies: No Known Drug Allergies  Orders Added: 1)  Est. Patient Level IV [20813] 2)  TLB-Lipid Panel [80061-LIPID] 3)  TLB-Hepatic/Liver Function Pnl [80076-HEPATIC] 4)  Venipuncture [36415] 5)  TLB-Renal Function Panel [80069-RENAL] 6)  TLB-TSH (Thyroid Stimulating Hormone) [84443-TSH] 7)  TLB-A1C / Hgb A1C (Glycohemoglobin) [83036-A1C]  Current Allergies (reviewed today): No known allergies

## 2010-12-14 ENCOUNTER — Encounter: Payer: Self-pay | Admitting: Internal Medicine

## 2010-12-19 NOTE — Progress Notes (Signed)
Summary: refill request for methadone  Phone Note Refill Request Call back at Home Phone [phone] Message from:  Patient  Refills Requested: Medication #1:  METHADONE HCL 10 MG TABS 2 four times daily Please call pt when ready.  Initial call taken by: Marty Heck CMA, [place],  December 12, 2010 9:31 AM  Follow-up for Phone Call        Rx written Follow-up by: Claris Gower MD,  December 12, 2010 10:24 AM  Additional Follow-up for Phone Call Additional follow up Details #1::        Spoke with patient and advised rx ready for pick-up  Additional Follow-up by: Edwin Dada CMA Deborra Medina),  December 12, 2010 1:18 PM    Prescriptions: METHADONE HCL 10 MG TABS (METHADONE HCL) 2 four times daily  #240 x 0   Entered and Authorized by:   Claris Gower MD   Signed by:   Claris Gower MD on 12/12/2010   Method used:   Print then Give to Patient   RxID:   [id number]

## 2010-12-19 NOTE — Progress Notes (Signed)
Summary: PANTOPRAZOLE SODIUM  Phone Note Refill Request Message from:  Patient on December 12, 2010 3:05 PM  Refills Requested: Medication #1:  PANTOPRAZOLE SODIUM 40 MG TBEC Take one by mouth two times a day patient dropped off letter stating Protonix is not covered two times a day, per pt what can she try? Form on your desk   Initial call taken by: DeShannon Tamala Julian CMA Deborra Medina),  December 12, 2010 3:05 PM  Follow-up for Phone Call        Please call her The insurance will not pay for her current stomach medication two times a day---they probably will cover omeprazole (prilosec) though  She was on that some years ago and we switched (not working great??)  Probably need to try again unless she will be okay with the pantoprazole just once a day Follow-up by: Claris Gower MD,  December 13, 2010 12:38 PM  Additional Follow-up for Phone Call Additional follow up Details #1::        left message on machine at home for patient to return my call.  DeShannon Tamala Julian [place] Deborra Medina)  December 13, 2010 2:54 PM   spoke with patient and she states the omeprazole wasn't strong enough, that the acid reflux was coming up her throat and out her nose? pt states she will try protonix once daily to see how it works. DeShannon Smith Parnell Deborra Medina)  December 13, 2010 4:23 PM   If that isn't effective, will need to file for appeal with the pharmacy benefit company. Can you check with them to see if they cover any other PPIs other than omeprazole for two times a day use since she has already failed omeprazole? Richard Horton Marshall MD  December 14, 2010 7:52 AM   patient doesn't want to try anything else, she will let us know if that doesn't work then we can appeal. Form scanned in pt's chart. Additional Follow-up by: Edwin Dada CMA Deborra Medina),  December 14, 2010 8:38 AM

## 2011-01-03 NOTE — Medication Information (Signed)
Summary: Pantoprazole/[facility]  Pantoprazole/[facility]   Imported By: Phillis Knack 12/24/2010 08:59:05  _____________________________________________________________________  External Attachment:    Type:   Image     Comment:   External Document

## 2011-01-09 ENCOUNTER — Telehealth: Payer: Self-pay | Admitting: Internal Medicine

## 2011-01-15 NOTE — Progress Notes (Signed)
Summary: refill request for methadone  Phone Note Refill Request Call back at Home Phone [phone] Message from:  Patient  Refills Requested: Medication #1:  METHADONE HCL 10 MG TABS 2 four times daily Please call pt when ready.  Initial call taken by: Marty Heck CMA, [place],  January 09, 2011 3:36 PM  Follow-up for Phone Call        Rx written Follow-up by: Claris Gower MD,  January 10, 2011 1:43 PM  Additional Follow-up for Phone Call Additional follow up Details #1::        Spoke with patient and advised rx ready for pick-up  Additional Follow-up by: Edwin Dada CMA Deborra Medina),  January 10, 2011 2:48 PM    Prescriptions: METHADONE HCL 10 MG TABS (METHADONE HCL) 2 four times daily  #240 x 0   Entered and Authorized by:   Claris Gower MD   Signed by:   Claris Gower MD on 01/10/2011   Method used:   Print then Give to Patient   RxID:   [id number]

## 2011-02-13 ENCOUNTER — Other Ambulatory Visit: Payer: Self-pay | Admitting: *Deleted

## 2011-02-13 MED ORDER — LOVASTATIN 40 MG PO TABS: ORAL_TABLET | ORAL | Status: DC

## 2011-02-13 MED ORDER — METHADONE HCL 10 MG PO TABS: ORAL_TABLET | ORAL | Status: DC

## 2011-02-13 NOTE — Telephone Encounter (Signed)
Spoke with patient and advised rx ready for pick-up

## 2011-03-12 ENCOUNTER — Other Ambulatory Visit: Payer: Self-pay | Admitting: *Deleted

## 2011-03-12 MED ORDER — METHADONE HCL 10 MG PO TABS: ORAL_TABLET | ORAL | Status: DC

## 2011-03-12 NOTE — Telephone Encounter (Signed)
Please call pt when ready.

## 2011-03-12 NOTE — Telephone Encounter (Signed)
Left message on machine that rx ready for pick-up

## 2011-03-14 ENCOUNTER — Other Ambulatory Visit: Payer: Self-pay | Admitting: *Deleted

## 2011-03-14 MED ORDER — TORSEMIDE 20 MG PO TABS: ORAL_TABLET | ORAL | Status: DC

## 2011-03-15 NOTE — Assessment & Plan Note (Signed)
[facility]                           GASTROENTEROLOGY OFFICE NOTE   NAME:Lindsay Harmon, Lindsay Harmon                      MRN:          [id number]  DATE:08/26/2006                            DOB:          1956/02/27    CHIEF COMPLAINT:  Followup of odynophagia, diarrhea.   Imodium twice daily is controlling her diarrhea quite well, and she is able  to get out of the house.  She thinks that when her husband moves out of the  house and she gets separated, things will be a lot better.   Her Barrett's esophagus is stable without dysplasia.  Swallowing is better  but not completely resolved.  I saw no stricture.  Esophageal biopsies  showed reflux but no other problems.   MEDICATIONS:  Listed and reviewed in the chart.   PAST MEDICAL HISTORY:  Reviewed and unchanged from the note of July 15, 2006.   Weight 169 pounds, pulse 76, blood pressure 120/40.   ASSESSMENT:  1. Gastroesophageal reflux disease with short segment Barrett's esophagus,      stable.  2. Irritable bowel syndrome, diarrhea, predominant.  Improved on Imodium.  3. Situational stressors probably having some role.   PLAN:  1. She still has some mild swallowing difficulty which is poorly      characterized.  I think we should observe it.  I would not add a      medication or do any other testing.  I suspect when her situational      stressors improve, this may improve.  2. Continue other care, as outlined.  Stay on Prilosec OTC b.i.d. and      surveillance EGD for Barrett's esophagus in three years.  3. See me as needed otherwise.     Gatha Mayer, MD,FACG    CEG/MedQ  DD: 08/26/2006  DT: 08/26/2006  Job #: [id number]   cc:   Venia Carbon, MD

## 2011-03-15 NOTE — Assessment & Plan Note (Signed)
HEALTHCARE                           GASTROENTEROLOGY OFFICE NOTE   NAME:Lindsay Harmon, Lindsay Harmon                      MRN:          [id number]  DATE:07/15/2006                            DOB:          1955-11-10    CHIEF COMPLAINT:  Dysphagia, needs followup of Barrett's esophagus.   ASSESSMENT:  1. Short segment Barrett's esophagus discovered on endoscopy exam Mar 14, 2004.  Due for a followup evaluation, letter was sent to her in April.  2. History of esophageal stricture, status post dilation.  3. Complaints of more odynophagia rather than dysphagia at this time, with      painful swallowing of liquids.  There is no solid food impact      dysphagia.  4. Chronic recurrent diarrhea, suggestive of irritable bowel syndrome,      diarrhea-predominant.  5. Gastroesophageal reflux disease.   PLAN:  1. Schedule upper gastrointestinal endoscopy with possible esophageal      dilation.  This is planned for July 24, 2006.  2. Hold metformin prior to the procedure.  3. Given history of coronary artery disease and diabetes, will continue      her aspirin for the procedure.  4. She is using some Imodium, and she is advised to use more of this to      control her diarrhea.  She has had some urge incontinence.  If this      does not work, consideration for empiric antibiotics for possible      bacterial overgrowth syndrome will be considered, versus other therapy.      She has had a colonoscopy in 2005 with a normal exam, though she did      have melanosis coli at the time.  She does not describe laxative use at      this time.   CURRENT MEDICATIONS:  1. Metformin 1000 mg b.i.d.  2. Gabapentin 300 mg morning and noon, and 600 mg evening.  3. Nitroglycerin patch 0.6 mg 7 a.m. to 7 p.m.  4. Glimepiride 4 mg b.i.d.  5. Lisinopril 20 mg daily.  6. Toprol XL 50 mg daily.  7. Nitrostat 0.4 mg p.r.n.  8. Torsemide 40 mg b.i.d.  9. Methadone 20 mg  q.i.d.  10.Alprazolam 0.5 mg one-half to one tablet 3 times daily.  11.Lovastatin 80 mg daily.  12.Mirtazapine 30 mg daily.  13.Prilosec OTC 1 tablet b.i.d.  14.Aspirin 325 mg daily.  15.Loratadine 10 mg daily.   PAST MEDICAL HISTORY:  1. Coronary artery disease without any percutaneous coronary intervention      in the past.  2. Carotid endarterectomy, left.  3. Hypertension.  4. Depression.  5. Obesity.  6. Nephrolithiasis.  7. Allergic rhinosinusitis.  8. Prior hysterectomy.  9. Type 2 diabetes mellitus.  10.Dyslipidemia.  11.Osteoarthritis.  12.Chronic pain syndrome.  13.Cesarean section in the past.  14.Bilateral carpal tunnel release.   FAMILY HISTORY:  No colon cancer.  Her mother did have ovarian cancer.   PHYSICAL EXAMINATION:  VITAL SIGNS:  Weight 171 pounds, pulse 68, blood  pressure 130/66.  The remainder of  her physical exam is unremarkable and reflected in my  medical history and physical form, as are other details of the visit.                                   Gatha Mayer, MD,FACG   CEG/MedQ  DD:  07/16/2006  DT:  07/17/2006  Job #:  [id number]   cc:   Venia Carbon, MD

## 2011-03-18 ENCOUNTER — Other Ambulatory Visit: Payer: Self-pay | Admitting: *Deleted

## 2011-03-18 MED ORDER — TORSEMIDE 20 MG PO TABS: ORAL_TABLET | ORAL | Status: DC

## 2011-03-18 NOTE — Telephone Encounter (Signed)
Quantity on demedex refill changed from 30 to # 60.

## 2011-04-04 ENCOUNTER — Encounter: Payer: Self-pay | Admitting: Internal Medicine

## 2011-04-05 ENCOUNTER — Encounter: Payer: Self-pay | Admitting: Internal Medicine

## 2011-04-05 ENCOUNTER — Ambulatory Visit (INDEPENDENT_AMBULATORY_CARE_PROVIDER_SITE_OTHER): Payer: Medicare Other | Admitting: Internal Medicine

## 2011-04-05 VITALS — BP 108/60 | HR 77 | Temp 98.7°F | Ht 62.0 in | Wt 151.0 lb

## 2011-04-05 DIAGNOSIS — M549 Dorsalgia, unspecified: Secondary | ICD-10-CM

## 2011-04-05 DIAGNOSIS — F329 Major depressive disorder, single episode, unspecified: Secondary | ICD-10-CM

## 2011-04-05 DIAGNOSIS — E1149 Type 2 diabetes mellitus with other diabetic neurological complication: Secondary | ICD-10-CM

## 2011-04-05 DIAGNOSIS — I2510 Atherosclerotic heart disease of native coronary artery without angina pectoris: Secondary | ICD-10-CM

## 2011-04-05 LAB — HEMOGLOBIN A1C: Hgb A1c MFr Bld: 7.3 % — ABNORMAL HIGH (ref 4.6–6.5)

## 2011-04-05 NOTE — Assessment & Plan Note (Signed)
Lab Results  Component Value Date   HGBA1C 7.7* 12/04/2010   Seems to be better  Will check labs

## 2011-04-05 NOTE — Patient Instructions (Signed)
Please start miralax 1 capful with water daily. If you are not moving your bowels at least every other day, please add senekot-S 2 tabs once or twice daily

## 2011-04-05 NOTE — Assessment & Plan Note (Signed)
Doing well on the methadone Stable dose for quite some time

## 2011-04-05 NOTE — Progress Notes (Signed)
Subjective:    Patient ID: Lindsay Harmon, female    DOB: [birth year], 55 y.o.   MRN: [id number]  HPI Doing well Tends to move bowels only every 4-5 days. Notes some fecal soiling at times. Has to wear liner Stools are not hard Discussed starting bowel regimen  Back pain is controlled with the methadone Has been fairly sedentary except for cleaning and reorganizing house  Checks sugars daily From 50s to 120 or so. No hypoglycemia  Mood has still been good Still on the meds  No chest pain No SOB No palpitations  Current Outpatient Prescriptions on File Prior to Visit  Medication Sig Dispense Refill  . ALPRAZolam (XANAX) 0.25 MG tablet Take 0.25 mg by mouth daily.       Marland Kitchen aspirin 325 MG tablet Take 325 mg by mouth daily.        Marland Kitchen gabapentin (NEURONTIN) 300 MG capsule Take 300 mg by mouth 2 (two) times daily. then take 2 at bedtime daily       . glimepiride (AMARYL) 4 MG tablet Take 4 mg by mouth 2 (two) times daily.        Marland Kitchen lisinopril (PRINIVIL,ZESTRIL) 20 MG tablet Take 20 mg by mouth daily.        Marland Kitchen loratadine (CLARITIN) 10 MG tablet Take 10 mg by mouth daily.        Marland Kitchen lovastatin (MEVACOR) 40 MG tablet Take 2 tablets by mouth daily  60 tablet  11  . metFORMIN (GLUCOPHAGE) 1000 MG tablet Take 1,000 mg by mouth 2 (two) times daily with a meal.        . methadone (DOLOPHINE) 10 MG tablet Take 2 tablets four times a day.  240 tablet  0  . metoprolol tartrate (LOPRESSOR) 25 MG tablet Take 25 mg by mouth 2 (two) times daily.        . mirtazapine (REMERON) 30 MG tablet Take 15-30 mg by mouth at bedtime.        . nitroGLYCERIN (NITROSTAT) 0.4 MG SL tablet Place 0.4 mg under the tongue every 5 (five) minutes as needed.        . pantoprazole (PROTONIX) 40 MG tablet Take 40 mg by mouth 2 (two) times daily.        Marland Kitchen torsemide (DEMADEX) 20 MG tablet Take 2 tablets by mouth twice daily.  30 tablet  3   Past Medical History  Diagnosis Date  . Depression   . GERD (gastroesophageal reflux  disease)   . Hyperlipidemia   . Hypertension   . PVD (peripheral vascular disease)   . CAD (coronary artery disease)   . Familial hematuria   . NIDDM (non-insulin dependent diabetes mellitus)     with neuropathy  . IBS (irritable bowel syndrome)   . Esophageal stricture   . Obesity, unspecified   . Nephrolithiasis   . Allergy   . Arthritis     Past Surgical History  Procedure Date  . Abdominal hysterectomy   . Cesarean section   . Carpal tunnel release     Family History  Problem Relation Age of Onset  . Diabetes Mother     History   Social History  . Marital Status: Legally Separated    Spouse Name: N/A    Number of Children: 1  . Years of Education: N/A   Occupational History  . 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 Smoking status: Former Research scientist (life sciences)  . Smokeless tobacco:  Not on file  . Alcohol Use: No     heavy in the past  . Drug Use: Not on file  . Sexually Active: Not on file   Other Topics Concern  . Not on file   Social History Narrative  . No narrative on file   Review of Systems Weight is down 7# from since last visit Sleeping fair--about the same (never a great sleeper)     Objective:   Physical Exam  Constitutional: She appears well-developed and well-nourished.  Neck: Normal range of motion. Neck supple. No thyromegaly present.  Cardiovascular: Normal rate, regular rhythm and normal heart sounds.  Exam reveals no gallop.   No murmur heard.      Faint pedal pulses  Pulmonary/Chest: Effort normal and breath sounds normal. No respiratory distress. She has no wheezes. She has no rales.  Musculoskeletal: Normal range of motion. She exhibits no edema and no tenderness.       Superficial varicosities in feet and distal calves  Lymphadenopathy:    She has no cervical adenopathy.  Psychiatric: She has a normal mood and affect. Her behavior is normal. Judgment and thought content normal.          Assessment &  Plan:

## 2011-04-05 NOTE — Assessment & Plan Note (Signed)
Still doing well Will continue meds

## 2011-04-05 NOTE — Assessment & Plan Note (Signed)
No angina for quite some time

## 2011-04-11 ENCOUNTER — Other Ambulatory Visit: Payer: Self-pay | Admitting: *Deleted

## 2011-04-11 NOTE — Telephone Encounter (Signed)
Yes the prescription does have to be printed and then pt has to pick up rx. Thank you.

## 2011-04-11 NOTE — Telephone Encounter (Signed)
Please call pt when ready, Dr. Letvak's patient. 

## 2011-04-11 NOTE — Telephone Encounter (Signed)
Dr.Aron does not write for Methadone, can you fill this? If not the pt will have to wait until Monday when Dr.Letvak returns.

## 2011-04-11 NOTE — Telephone Encounter (Signed)
If Dr Silvio Pate routinely writes for this -- and she is due for it -- I will -- but am working from home today and I think it has to be printed out.... So let me know if I need to do in am

## 2011-04-12 MED ORDER — METHADONE HCL 10 MG PO TABS: ORAL_TABLET | ORAL | Status: DC

## 2011-04-12 NOTE — Telephone Encounter (Signed)
Patient notified as instructed by telephone. Prescription left at front desk.

## 2011-04-12 NOTE — Telephone Encounter (Signed)
Px printed for pick up in IN box

## 2011-05-13 ENCOUNTER — Other Ambulatory Visit: Payer: Self-pay | Admitting: *Deleted

## 2011-05-13 MED ORDER — METHADONE HCL 10 MG PO TABS: ORAL_TABLET | ORAL | Status: DC

## 2011-05-13 NOTE — Telephone Encounter (Signed)
Spoke with patient and advised results   

## 2011-06-13 ENCOUNTER — Other Ambulatory Visit: Payer: Self-pay | Admitting: *Deleted

## 2011-06-13 MED ORDER — METHADONE HCL 10 MG PO TABS: ORAL_TABLET | ORAL | Status: DC

## 2011-06-13 NOTE — Telephone Encounter (Signed)
Spoke with patient and advised results

## 2011-07-12 ENCOUNTER — Other Ambulatory Visit: Payer: Self-pay | Admitting: *Deleted

## 2011-07-12 MED ORDER — MIRTAZAPINE 30 MG PO TABS: 15.0000 mg | ORAL_TABLET | Freq: Every day | ORAL | Status: DC

## 2011-07-12 NOTE — Telephone Encounter (Signed)
Last refilled 04/12/2011, Rx in your IN box.

## 2011-07-12 NOTE — Telephone Encounter (Signed)
Rx sent electronically

## 2011-07-19 ENCOUNTER — Other Ambulatory Visit: Payer: Self-pay | Admitting: *Deleted

## 2011-07-19 MED ORDER — METHADONE HCL 10 MG PO TABS: ORAL_TABLET | ORAL | Status: DC

## 2011-07-19 NOTE — Telephone Encounter (Signed)
I don't prescribe methadone.  Does she have enough until Monday?

## 2011-07-19 NOTE — Telephone Encounter (Signed)
Patient notified as instructed by telephone. Pt will pick up rx today.

## 2011-07-19 NOTE — Telephone Encounter (Signed)
I will refil for the month in Dr Alla German absence  Px printed for pick up in IN box

## 2011-07-19 NOTE — Telephone Encounter (Signed)
Please call pt when ready, Dr. Alla German patient.

## 2011-07-19 NOTE — Telephone Encounter (Signed)
Patient will run out Sunday.  Dr.Tower can you fill this?

## 2011-07-31 LAB — GLUCOSE, CAPILLARY
Glucose-Capillary: 109 — ABNORMAL HIGH
Glucose-Capillary: 26 — CL

## 2011-08-07 ENCOUNTER — Other Ambulatory Visit: Payer: Self-pay | Admitting: *Deleted

## 2011-08-07 MED ORDER — LISINOPRIL 20 MG PO TABS: 20.0000 mg | ORAL_TABLET | Freq: Every day | ORAL | Status: DC

## 2011-08-09 ENCOUNTER — Encounter: Payer: Medicare Other | Admitting: Internal Medicine

## 2011-08-13 ENCOUNTER — Ambulatory Visit (INDEPENDENT_AMBULATORY_CARE_PROVIDER_SITE_OTHER): Payer: Medicare Other | Admitting: Internal Medicine

## 2011-08-13 ENCOUNTER — Encounter: Payer: Self-pay | Admitting: Internal Medicine

## 2011-08-13 VITALS — BP 138/60 | HR 78 | Temp 98.1°F | Ht 62.0 in | Wt 146.0 lb

## 2011-08-13 DIAGNOSIS — E785 Hyperlipidemia, unspecified: Secondary | ICD-10-CM

## 2011-08-13 DIAGNOSIS — I10 Essential (primary) hypertension: Secondary | ICD-10-CM

## 2011-08-13 DIAGNOSIS — Z0000 Encounter for general adult medical examination without abnormal findings: Secondary | ICD-10-CM

## 2011-08-13 DIAGNOSIS — E1149 Type 2 diabetes mellitus with other diabetic neurological complication: Secondary | ICD-10-CM

## 2011-08-13 DIAGNOSIS — Z23 Encounter for immunization: Secondary | ICD-10-CM

## 2011-08-13 DIAGNOSIS — F329 Major depressive disorder, single episode, unspecified: Secondary | ICD-10-CM

## 2011-08-13 DIAGNOSIS — G8929 Other chronic pain: Secondary | ICD-10-CM

## 2011-08-13 DIAGNOSIS — M545 Low back pain, unspecified: Secondary | ICD-10-CM

## 2011-08-13 DIAGNOSIS — Z1231 Encounter for screening mammogram for malignant neoplasm of breast: Secondary | ICD-10-CM

## 2011-08-13 DIAGNOSIS — M549 Dorsalgia, unspecified: Secondary | ICD-10-CM

## 2011-08-13 DIAGNOSIS — E1142 Type 2 diabetes mellitus with diabetic polyneuropathy: Secondary | ICD-10-CM

## 2011-08-13 DIAGNOSIS — F3289 Other specified depressive episodes: Secondary | ICD-10-CM

## 2011-08-13 DIAGNOSIS — I2510 Atherosclerotic heart disease of native coronary artery without angina pectoris: Secondary | ICD-10-CM

## 2011-08-13 LAB — CBC WITH DIFFERENTIAL/PLATELET
Basophils Absolute: 0 10*3/uL (ref 0.0–0.1)
HCT: 39.7 % (ref 36.0–46.0)
Hemoglobin: 13 g/dL (ref 12.0–15.0)
Lymphs Abs: 3.2 10*3/uL (ref 0.7–4.0)
MCV: 84.7 fl (ref 78.0–100.0)
Monocytes Absolute: 0.6 10*3/uL (ref 0.1–1.0)
Monocytes Relative: 5.5 % (ref 3.0–12.0)
Neutro Abs: 6.5 10*3/uL (ref 1.4–7.7)
Platelets: 369 10*3/uL (ref 150.0–400.0)
RDW: 17.8 % — ABNORMAL HIGH (ref 11.5–14.6)

## 2011-08-13 LAB — BASIC METABOLIC PANEL
BUN: 13 mg/dL (ref 6–23)
CO2: 30 mEq/L (ref 19–32)
Chloride: 99 mEq/L (ref 96–112)
GFR: 43.22 mL/min — ABNORMAL LOW (ref >60.00)
Glucose, Bld: 69 mg/dL — ABNORMAL LOW (ref 70–99)
Potassium: 4.2 mEq/L (ref 3.5–5.1)
Sodium: 139 mEq/L (ref 135–145)

## 2011-08-13 LAB — LIPID PANEL
Cholesterol: 158 mg/dL (ref 0–200)
LDL Cholesterol: 76 mg/dL (ref 0–99)
Total CHOL/HDL Ratio: 4
Triglycerides: 198 mg/dL — ABNORMAL HIGH (ref 0.0–149.0)
VLDL: 39.6 mg/dL (ref 0.0–40.0)

## 2011-08-13 NOTE — Progress Notes (Signed)
Subjective:    Patient ID: Lindsay Harmon, female    DOB: Mar 19, 1956, 55 y.o.   MRN: [id number]  HPI Doing okay Here for medicare Wellness visit as well as routine follow up Mild hearing problems---no major functional issues Depression controlled on meds No falls---is careful due to her chronic back problems (not as sure footed as in the past--occ staggers) See form  Checks sugars regularly daily) Many are under 60 (40's and 50's but generally no reactions) Due for eye exam---will set up Slight numbness in feet---no sig change  Satisfied with pain control on the methadone occ bad days but able to do most of her instrumental ADLs  No chest pain No SOB Slight edema---keeps them up as needed  Bowels are a little better Now taking miralax regularly  Current Outpatient Prescriptions on File Prior to Visit  Medication Sig Dispense Refill  . ALPRAZolam (XANAX) 0.25 MG tablet Take 0.25 mg by mouth daily.       Marland Kitchen aspirin 325 MG tablet Take 325 mg by mouth daily.        Marland Kitchen gabapentin (NEURONTIN) 300 MG capsule Take 300 mg by mouth 2 (two) times daily. then take 2 at bedtime daily       . glimepiride (AMARYL) 4 MG tablet Take 4 mg by mouth 2 (two) times daily.        Marland Kitchen lisinopril (PRINIVIL,ZESTRIL) 20 MG tablet Take 1 tablet (20 mg total) by mouth daily.  30 tablet  1  . loratadine (CLARITIN) 10 MG tablet Take 10 mg by mouth 2 (two) times daily.       Marland Kitchen lovastatin (MEVACOR) 40 MG tablet Take 2 tablets by mouth daily  60 tablet  11  . metFORMIN (GLUCOPHAGE) 1000 MG tablet Take 1,000 mg by mouth 2 (two) times daily with a meal.        . methadone (DOLOPHINE) 10 MG tablet Take 2 tablets four times a day.  240 tablet  0  . metoprolol tartrate (LOPRESSOR) 25 MG tablet Take 25 mg by mouth 2 (two) times daily.        . mirtazapine (REMERON) 30 MG tablet Take 0.5-1 tablets (15-30 mg total) by mouth at bedtime.  30 tablet  11  . nitroGLYCERIN (NITROSTAT) 0.4 MG SL tablet Place 0.4 mg under the  tongue every 5 (five) minutes as needed.        . pantoprazole (PROTONIX) 40 MG tablet Take 40 mg by mouth 2 (two) times daily.        . polyethylene glycol (MIRALAX / GLYCOLAX) packet Take 17 g by mouth daily.        Marland Kitchen torsemide (DEMADEX) 20 MG tablet Take 2 tablets by mouth twice daily.  30 tablet  3    No Known Allergies  Past Medical History  Diagnosis Date  . Depression   . GERD (gastroesophageal reflux disease)   . Hyperlipidemia   . Hypertension   . PVD (peripheral vascular disease)   . CAD (coronary artery disease)   . Familial hematuria   . NIDDM (non-insulin dependent diabetes mellitus)     with neuropathy  . IBS (irritable bowel syndrome)   . Esophageal stricture   . Obesity, unspecified   . Nephrolithiasis   . Allergy   . Arthritis     Past Surgical History  Procedure Date  . Abdominal hysterectomy   . Cesarean section   . Carpal tunnel release     Family History  Problem Relation Age of  Onset  . Diabetes Mother     History   Social History  . Marital Status: Legally Separated    Spouse Name: N/A    Number of Children: 1  . Years of Education: N/A   Occupational History  . 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 Smokeless tobacco: Never Used  . Alcohol Use: No     heavy in the past  . Drug Use: Not on file  . Sexually Active: Not on file   Other Topics Concern  . Not on file   Social History Narrative   No living [place] should make decisions for her if she is unable. Would accept resuscitation attempts but no prolonged ventilationNot sure about tube feeds but probably wouldn't want them if cognitively aware   Review of Systems Has sty forming on right lower lid Due for eye exam for diabetes Weight is down 5#---lowest she has been in 4 years Appetite is fine Sleeps about the same    Objective:   Physical Exam  Constitutional: She is oriented to person, place, and  time. She appears well-developed and well-nourished. No distress.  Neck: Normal range of motion. Neck supple. No thyromegaly present.  Cardiovascular: Normal rate, regular rhythm and normal heart sounds.  Exam reveals no gallop.   No murmur heard.      Faint pedal pulses  Pulmonary/Chest: Effort normal and breath sounds normal. No respiratory distress. She has no wheezes. She has no rales.  Abdominal: Soft. She exhibits no mass. There is no tenderness.  Genitourinary:       Breast with periareolar cystic tissue---R>L  Musculoskeletal: She exhibits no edema and no tenderness.  Lymphadenopathy:    She has no cervical adenopathy.    She has no axillary adenopathy.  Neurological: She is alert and oriented to person, place, and time. She exhibits normal muscle tone.       President--"Obama, ??" [id number] Recall 2/3  Normal gait and strength  Skin: No rash noted.  Psychiatric: She has a normal mood and affect. Her behavior is normal. Judgment and thought content normal.          Assessment & Plan:

## 2011-08-13 NOTE — Assessment & Plan Note (Signed)
Reasonable control on methadone with no dose change in years

## 2011-08-13 NOTE — Assessment & Plan Note (Signed)
Has been having lots of hypoglycemic spells (and very freq under 60) If A1c well controlled--will decrease the glimepiride Lab Results  Component Value Date   HGBA1C 7.3* 04/05/2011

## 2011-08-13 NOTE — Assessment & Plan Note (Signed)
No recent angina On approp meds

## 2011-08-13 NOTE — Assessment & Plan Note (Signed)
Fair control Needs to continue meds due to chronic pain and depression

## 2011-08-13 NOTE — Assessment & Plan Note (Signed)
BP Readings from Last 3 Encounters:  08/13/11 138/60  04/05/11 108/60  12/04/10 125/61   Good control No changes needed

## 2011-08-13 NOTE — Assessment & Plan Note (Signed)
I have personally reviewed the Medicare Annual Wellness questionnaire and have noted 1. The patient's medical and social history 2. Their use of alcohol, tobacco or illicit drugs 3. Their current medications and supplements 4. The patient's functional ability including ADL's, fall risks, home safety risks and hearing or visual             impairment. 5. Diet and physical activities 6. Evidence for depression or mood disorders  The patients weight, height, BMI and visual acuity have been recorded in the chart I have made referrals, counseling and provided education to the patient based review of the above and I have provided the pt with a written personalized care plan for preventive services.  I have provided you with a copy of your personalized plan for preventive services. Please take the time to review along with your updated medication list.  Will set up mammo Flu shot today

## 2011-08-14 LAB — HEPATIC FUNCTION PANEL
AST: 16 U/L (ref 0–37)
Albumin: 3.8 g/dL (ref 3.5–5.2)
Alkaline Phosphatase: 81 U/L (ref 39–117)
Total Protein: 7.5 g/dL (ref 6.0–8.3)

## 2011-08-19 ENCOUNTER — Other Ambulatory Visit: Payer: Self-pay | Admitting: *Deleted

## 2011-08-19 MED ORDER — METHADONE HCL 10 MG PO TABS: ORAL_TABLET | ORAL | Status: DC

## 2011-08-19 NOTE — Telephone Encounter (Signed)
Please call patient when ready.

## 2011-08-19 NOTE — Telephone Encounter (Signed)
Spoke with patient and advised results   

## 2011-09-17 ENCOUNTER — Other Ambulatory Visit: Payer: Self-pay | Admitting: *Deleted

## 2011-09-17 MED ORDER — METHADONE HCL 10 MG PO TABS: ORAL_TABLET | ORAL | Status: DC

## 2011-09-17 NOTE — Telephone Encounter (Signed)
Spoke with patient and advised results

## 2011-09-17 NOTE — Telephone Encounter (Signed)
Stable use documented since at least Aug 09 in the chart. Will fill script for one month.

## 2011-09-17 NOTE — Telephone Encounter (Signed)
Please have another physician handle this Wednesday and make sure I don't get any more requests as I will likely not look at my computer for several days

## 2011-09-17 NOTE — Telephone Encounter (Signed)
Ok to refill? Dr.Letvak not in the office until Monday?

## 2011-10-09 ENCOUNTER — Ambulatory Visit: Payer: Commercial Managed Care - HMO | Admitting: Internal Medicine

## 2011-10-10 ENCOUNTER — Encounter: Payer: Self-pay | Admitting: *Deleted

## 2011-10-10 ENCOUNTER — Other Ambulatory Visit: Payer: Self-pay | Admitting: *Deleted

## 2011-10-10 MED ORDER — LISINOPRIL 20 MG PO TABS: 20.0000 mg | ORAL_TABLET | Freq: Every day | ORAL | Status: DC

## 2011-10-15 ENCOUNTER — Other Ambulatory Visit: Payer: Self-pay | Admitting: Internal Medicine

## 2011-10-15 MED ORDER — METHADONE HCL 10 MG PO TABS: ORAL_TABLET | ORAL | Status: DC

## 2011-10-15 NOTE — Telephone Encounter (Signed)
Spoke with patient and advised results   

## 2011-10-15 NOTE — Telephone Encounter (Signed)
Request Rx for Methadone

## 2011-11-14 ENCOUNTER — Other Ambulatory Visit: Payer: Self-pay | Admitting: *Deleted

## 2011-11-14 MED ORDER — METHADONE HCL 10 MG PO TABS: ORAL_TABLET | ORAL | Status: DC

## 2011-11-14 NOTE — Telephone Encounter (Signed)
Spoke with patient and advised results

## 2011-12-06 ENCOUNTER — Ambulatory Visit: Payer: Medicare Other | Admitting: Internal Medicine

## 2011-12-12 ENCOUNTER — Other Ambulatory Visit: Payer: Self-pay | Admitting: *Deleted

## 2011-12-12 MED ORDER — GLIMEPIRIDE 4 MG PO TABS: 4.0000 mg | ORAL_TABLET | Freq: Two times a day (BID) | ORAL | Status: DC

## 2011-12-13 ENCOUNTER — Ambulatory Visit: Payer: Medicare Other | Admitting: Internal Medicine

## 2011-12-20 ENCOUNTER — Ambulatory Visit (INDEPENDENT_AMBULATORY_CARE_PROVIDER_SITE_OTHER): Payer: Medicare Other | Admitting: Internal Medicine

## 2011-12-20 ENCOUNTER — Encounter: Payer: Self-pay | Admitting: Internal Medicine

## 2011-12-20 DIAGNOSIS — F3289 Other specified depressive episodes: Secondary | ICD-10-CM

## 2011-12-20 DIAGNOSIS — I10 Essential (primary) hypertension: Secondary | ICD-10-CM

## 2011-12-20 DIAGNOSIS — I2510 Atherosclerotic heart disease of native coronary artery without angina pectoris: Secondary | ICD-10-CM

## 2011-12-20 DIAGNOSIS — E1142 Type 2 diabetes mellitus with diabetic polyneuropathy: Secondary | ICD-10-CM

## 2011-12-20 DIAGNOSIS — M549 Dorsalgia, unspecified: Secondary | ICD-10-CM

## 2011-12-20 DIAGNOSIS — E1149 Type 2 diabetes mellitus with other diabetic neurological complication: Secondary | ICD-10-CM

## 2011-12-20 DIAGNOSIS — F329 Major depressive disorder, single episode, unspecified: Secondary | ICD-10-CM

## 2011-12-20 DIAGNOSIS — F411 Generalized anxiety disorder: Secondary | ICD-10-CM

## 2011-12-20 MED ORDER — METHADONE HCL 10 MG PO TABS: ORAL_TABLET | ORAL | Status: DC

## 2011-12-20 NOTE — Assessment & Plan Note (Signed)
Still satisfied with stable methadone dose

## 2011-12-20 NOTE — Assessment & Plan Note (Signed)
BP Readings from Last 3 Encounters:  12/20/11 120/70  08/13/11 138/60  04/05/11 108/60   Good control No changes needed

## 2011-12-20 NOTE — Progress Notes (Signed)
Subjective:    Patient ID: Lindsay Harmon, female    DOB: May 18, 1956, 56 y.o.   MRN: [id number]  HPI Doing fairly well Still checks sugars daily---most under 100. Often under 60 Does have freq, though, mild hypoglycemic reactions  Still satisfied with pain control Able to drive, do shopping Has to limit housework but does what she can and keeps up with things  No chest pain No SOB Occ mild edema---seems to be more in the AM then improves  Mood has been okay Feels like she is on a even keel Notes that her depression has been chronic  Current Outpatient Prescriptions on File Prior to Visit  Medication Sig Dispense Refill  . ALPRAZolam (XANAX) 0.25 MG tablet Take 0.25 mg by mouth daily.       Marland Kitchen aspirin 325 MG tablet Take 325 mg by mouth daily.        Marland Kitchen gabapentin (NEURONTIN) 300 MG capsule Take 300 mg by mouth 2 (two) times daily. then take 2 at bedtime daily       . lisinopril (PRINIVIL,ZESTRIL) 20 MG tablet Take 1 tablet (20 mg total) by mouth daily.  30 tablet  11  . loratadine (CLARITIN) 10 MG tablet Take 10 mg by mouth 2 (two) times daily.       Marland Kitchen lovastatin (MEVACOR) 40 MG tablet Take 2 tablets by mouth daily  60 tablet  11  . metFORMIN (GLUCOPHAGE) 1000 MG tablet Take 1,000 mg by mouth 2 (two) times daily with a meal.        . methadone (DOLOPHINE) 10 MG tablet Take 2 tablets four times a day.  240 tablet  0  . metoprolol tartrate (LOPRESSOR) 25 MG tablet Take 25 mg by mouth 2 (two) times daily.        . mirtazapine (REMERON) 30 MG tablet Take 0.5-1 tablets (15-30 mg total) by mouth at bedtime.  30 tablet  11  . nitroGLYCERIN (NITROSTAT) 0.4 MG SL tablet Place 0.4 mg under the tongue every 5 (five) minutes as needed.        . pantoprazole (PROTONIX) 40 MG tablet Take 40 mg by mouth 2 (two) times daily.        . polyethylene glycol (MIRALAX / GLYCOLAX) packet Take 17 g by mouth daily.        Marland Kitchen torsemide (DEMADEX) 20 MG tablet Take 2 tablets by mouth twice daily.  30 tablet  3      No Known Allergies  Past Medical History  Diagnosis Date  . Depression   . GERD (gastroesophageal reflux disease)   . Hyperlipidemia   . Hypertension   . PVD (peripheral vascular disease)   . CAD (coronary artery disease)   . Familial hematuria   . NIDDM (non-insulin dependent diabetes mellitus)     with neuropathy  . IBS (irritable bowel syndrome)   . Esophageal stricture   . Obesity, unspecified   . Nephrolithiasis   . Allergy   . Arthritis     Past Surgical History  Procedure Date  . Abdominal hysterectomy   . Cesarean section   . Carpal tunnel release     Family History  Problem Relation Age of Onset  . Diabetes Mother     History   Social History  . Marital Status: Legally Separated    Spouse Name: N/A    Number of Children: 1  . Years of Education: N/A   Occupational History  . 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 Smoking status: Former Research scientist (life sciences)  . Smokeless tobacco: Never Used  . Alcohol Use: No     heavy in the past  . Drug Use: Not on file  . Sexually Active: Not on file   Other Topics Concern  . Not on file   Social History Narrative   No living [place] should make decisions for her if she is unable. Would accept resuscitation attempts but no prolonged ventilationNot sure about tube feeds but probably wouldn't want them if cognitively aware   Review of Systems Did have increased sensitivity of right leg briefly--for a day--then back to normal No chronic sensory changes in feet Sleeps okay Diarrhea has gotten bad again--improved in past with decreased metformin    Objective:   Physical Exam  Constitutional: She appears well-developed and well-nourished. No distress.  Neck: Normal range of motion. Neck supple. No thyromegaly present.  Cardiovascular: Normal rate, regular rhythm and normal heart sounds.  Exam reveals no gallop.   No murmur heard.      Faint pedal pulses  Pulmonary/Chest: Effort  normal and breath sounds normal. No respiratory distress. She has no wheezes. She has no rales.  Abdominal: Soft. There is no tenderness.  Musculoskeletal: She exhibits no edema and no tenderness.  Lymphadenopathy:    She has no cervical adenopathy.  Skin: No rash noted.       No foot lesions  Psychiatric: She has a normal mood and affect. Her behavior is normal. Thought content normal.          Assessment & Plan:

## 2011-12-20 NOTE — Patient Instructions (Addendum)
Decrease the glimeride to 20m just once a day. If you still have low sugar reactions, we will then cut this dose in half Also cut the metformin in half---5047mtwice a day Call if your sugars go up a lot---like over 140 in AM regularly

## 2011-12-20 NOTE — Assessment & Plan Note (Signed)
Still on edge at times but generally controlled

## 2011-12-20 NOTE — Assessment & Plan Note (Addendum)
Control has been too tight with freq hypoglycemia Will cut glimepiride to 80m daily Diarrhea from metformin---will cut that back also--too 5015mbid Adjust if goes too high

## 2011-12-20 NOTE — Assessment & Plan Note (Signed)
Has been quiet

## 2011-12-20 NOTE — Assessment & Plan Note (Signed)
Improved Has been lifelong Will continue the meds

## 2011-12-24 ENCOUNTER — Telehealth: Payer: Self-pay | Admitting: *Deleted

## 2011-12-24 NOTE — Telephone Encounter (Signed)
Spoke with patient about form we received asking for diabetes supplies from [employer], pt was confused about this company she usually gets supplies from [employer], she did receive something in the mail about this but still confused, pt wanted me to hold the form until she speak with them. Form holding on my desk

## 2012-01-06 ENCOUNTER — Other Ambulatory Visit: Payer: Self-pay | Admitting: *Deleted

## 2012-01-06 MED ORDER — METFORMIN HCL 1000 MG PO TABS: 1000.0000 mg | ORAL_TABLET | Freq: Two times a day (BID) | ORAL | Status: DC

## 2012-01-14 ENCOUNTER — Other Ambulatory Visit: Payer: Self-pay | Admitting: *Deleted

## 2012-01-15 MED ORDER — METHADONE HCL 10 MG PO TABS: ORAL_TABLET | ORAL | Status: DC

## 2012-01-15 NOTE — Telephone Encounter (Signed)
Spoke with patient and advised rx ready for pick-up and it will be at the front desk.  

## 2012-02-06 ENCOUNTER — Other Ambulatory Visit: Payer: Self-pay | Admitting: *Deleted

## 2012-02-06 MED ORDER — PANTOPRAZOLE SODIUM 40 MG PO TBEC: 40.0000 mg | DELAYED_RELEASE_TABLET | Freq: Two times a day (BID) | ORAL | Status: DC

## 2012-02-06 MED ORDER — GABAPENTIN 300 MG PO CAPS: 300.0000 mg | ORAL_CAPSULE | Freq: Two times a day (BID) | ORAL | Status: DC

## 2012-02-06 MED ORDER — METOPROLOL TARTRATE 25 MG PO TABS: 25.0000 mg | ORAL_TABLET | Freq: Two times a day (BID) | ORAL | Status: DC

## 2012-02-06 MED ORDER — LOVASTATIN 40 MG PO TABS: ORAL_TABLET | ORAL | Status: DC

## 2012-02-07 MED ORDER — ALPRAZOLAM 0.25 MG PO TABS: 0.2500 mg | ORAL_TABLET | Freq: Every day | ORAL | Status: DC

## 2012-02-07 NOTE — Telephone Encounter (Signed)
rx called into pharmacy

## 2012-02-07 NOTE — Telephone Encounter (Signed)
Okay #30 x 1

## 2012-02-17 ENCOUNTER — Other Ambulatory Visit: Payer: Self-pay

## 2012-02-17 NOTE — Telephone Encounter (Signed)
Pt left v/m requesting written rx for methadone 10 mg. Pt last seen 12/20/11. Pt can be reached at [phone] when rx ready for pick up.

## 2012-02-18 MED ORDER — METHADONE HCL 10 MG PO TABS: ORAL_TABLET | ORAL | Status: DC

## 2012-02-18 NOTE — Telephone Encounter (Signed)
, °  Spoke with patient and advised rx ready for pick-up and it will be at the front desk.

## 2012-03-16 ENCOUNTER — Telehealth: Payer: Self-pay | Admitting: Internal Medicine

## 2012-03-16 MED ORDER — METHADONE HCL 10 MG PO TABS: ORAL_TABLET | ORAL | Status: DC

## 2012-03-16 NOTE — Telephone Encounter (Signed)
Request for methadone refill

## 2012-03-16 NOTE — Telephone Encounter (Signed)
rx written

## 2012-03-17 NOTE — Telephone Encounter (Signed)
Spoke with patient and advised rx ready for pick-up and it will be at the front desk.  

## 2012-04-01 ENCOUNTER — Other Ambulatory Visit: Payer: Self-pay | Admitting: *Deleted

## 2012-04-01 MED ORDER — TORSEMIDE 20 MG PO TABS: 40.0000 mg | ORAL_TABLET | Freq: Two times a day (BID) | ORAL | Status: DC

## 2012-04-20 ENCOUNTER — Other Ambulatory Visit: Payer: Self-pay

## 2012-04-20 MED ORDER — METHADONE HCL 10 MG PO TABS: ORAL_TABLET | ORAL | Status: DC

## 2012-04-20 NOTE — Telephone Encounter (Signed)
Pt request rx for Methadone. In Dr Alla German absence can you write rx.? Call when rx ready for pick up.

## 2012-04-20 NOTE — Telephone Encounter (Signed)
Patient advised.  Rx left at front desk for pick up.

## 2012-04-20 NOTE — Telephone Encounter (Signed)
Printed.  Thanks.

## 2012-04-21 ENCOUNTER — Ambulatory Visit: Payer: Medicare Other | Admitting: Internal Medicine

## 2012-04-28 ENCOUNTER — Ambulatory Visit (INDEPENDENT_AMBULATORY_CARE_PROVIDER_SITE_OTHER): Payer: Medicare Other | Admitting: Internal Medicine

## 2012-04-28 ENCOUNTER — Encounter: Payer: Self-pay | Admitting: Internal Medicine

## 2012-04-28 VITALS — BP 118/78 | HR 78 | Temp 98.5°F | Ht 62.0 in | Wt 145.0 lb

## 2012-04-28 DIAGNOSIS — E1149 Type 2 diabetes mellitus with other diabetic neurological complication: Secondary | ICD-10-CM

## 2012-04-28 DIAGNOSIS — E1142 Type 2 diabetes mellitus with diabetic polyneuropathy: Secondary | ICD-10-CM

## 2012-04-28 DIAGNOSIS — R799 Abnormal finding of blood chemistry, unspecified: Secondary | ICD-10-CM

## 2012-04-28 DIAGNOSIS — F3289 Other specified depressive episodes: Secondary | ICD-10-CM

## 2012-04-28 DIAGNOSIS — I10 Essential (primary) hypertension: Secondary | ICD-10-CM

## 2012-04-28 DIAGNOSIS — F329 Major depressive disorder, single episode, unspecified: Secondary | ICD-10-CM

## 2012-04-28 DIAGNOSIS — R7989 Other specified abnormal findings of blood chemistry: Secondary | ICD-10-CM

## 2012-04-28 DIAGNOSIS — M549 Dorsalgia, unspecified: Secondary | ICD-10-CM

## 2012-04-28 NOTE — Progress Notes (Signed)
Subjective:    Patient ID: Lindsay Harmon, female    DOB: [birth year], 56 y.o.   MRN: [id number]  HPI Doing well Cut the glimiperide and the metformin Still running very low on sugars Most are under 100 and some under 60 Only occ and mild hypoglycemic reactions Diarrhea is now better on lower dose of metformin Due for eye exam---is going to set this up  Still satisfied with the methadone for pain relief Still has some bad days and "I just ride it out" Not excited about going down  No chest pain No SOB No real exercise---keeps up house  Mood has been fine Not anhedonic Mostly keeps to herself--she prefers this  Current Outpatient Prescriptions on File Prior to Visit  Medication Sig Dispense Refill  . ALPRAZolam (XANAX) 0.25 MG tablet Take 1 tablet (0.25 mg total) by mouth daily.  30 tablet  1  . aspirin 325 MG tablet Take 325 mg by mouth daily.        Marland Kitchen gabapentin (NEURONTIN) 300 MG capsule Take 1 capsule (300 mg total) by mouth 2 (two) times daily. then take 2 at bedtime daily  120 capsule  11  . glimepiride (AMARYL) 4 MG tablet Take 4 mg by mouth daily before breakfast.      . lisinopril (PRINIVIL,ZESTRIL) 20 MG tablet Take 1 tablet (20 mg total) by mouth daily.  30 tablet  11  . loratadine (CLARITIN) 10 MG tablet Take 10 mg by mouth 2 (two) times daily.       Marland Kitchen lovastatin (MEVACOR) 40 MG tablet Take 2 tablets by mouth daily  60 tablet  11  . methadone (DOLOPHINE) 10 MG tablet Take 2 tablets four times a day.  240 tablet  0  . metoprolol tartrate (LOPRESSOR) 25 MG tablet Take 1 tablet (25 mg total) by mouth 2 (two) times daily.  60 tablet  11  . mirtazapine (REMERON) 30 MG tablet Take 0.5-1 tablets (15-30 mg total) by mouth at bedtime.  30 tablet  11  . nitroGLYCERIN (NITROSTAT) 0.4 MG SL tablet Place 0.4 mg under the tongue every 5 (five) minutes as needed.        . pantoprazole (PROTONIX) 40 MG tablet Take 1 tablet (40 mg total) by mouth 2 (two) times daily.  60 tablet  11  .  polyethylene glycol (MIRALAX / GLYCOLAX) packet Take 17 g by mouth daily.        Marland Kitchen torsemide (DEMADEX) 20 MG tablet Take 2 tablets (40 mg total) by mouth 2 (two) times daily.  120 tablet  11  . DISCONTD: metFORMIN (GLUCOPHAGE) 1000 MG tablet Take 1 tablet (1,000 mg total) by mouth 2 (two) times daily with a meal.  60 tablet  11    No Known Allergies  Past Medical History  Diagnosis Date  . Depression   . GERD (gastroesophageal reflux disease)   . Hyperlipidemia   . Hypertension   . PVD (peripheral vascular disease)   . CAD (coronary artery disease)   . Familial hematuria   . NIDDM (non-insulin dependent diabetes mellitus)     with neuropathy  . IBS (irritable bowel syndrome)   . Esophageal stricture   . Obesity, unspecified   . Nephrolithiasis   . Allergy   . Arthritis     Past Surgical History  Procedure Date  . Abdominal hysterectomy   . Cesarean section   . Carpal tunnel release     Family History  Problem Relation Age of Onset  .  Diabetes Mother     History   Social History  . Marital Status: Legally Separated    Spouse Name: N/A    Number of Children: 1  . Years of Education: N/A   Occupational History  . 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 Smokeless tobacco: Never Used  . Alcohol Use: No     heavy in the past  . Drug Use: Not on file  . Sexually Active: Not on file   Other Topics Concern  . Not on file   Social History Narrative   No living [place] should make decisions for her if she is unable. Would accept resuscitation attempts but no prolonged ventilationNot sure about tube feeds but probably wouldn't want them if cognitively aware   Review of Systems Sleep is never great---doesn't really require a lot of sleep. Sleeps in recliner (can't sleep in bed due to back) Appetite is fine Weight stable    Objective:   Physical Exam  Constitutional: She appears  well-developed and well-nourished. No distress.  Neck: Normal range of motion. Neck supple. No thyromegaly present.  Cardiovascular: Normal rate and regular rhythm.  Exam reveals no gallop.   Murmur heard.      Faint distal pulses Soft late systolic murmur at pulmonic area to apex  Pulmonary/Chest: Effort normal and breath sounds normal. No respiratory distress. She has no wheezes. She has no rales.  Musculoskeletal: She exhibits no edema and no tenderness.  Lymphadenopathy:    She has no cervical adenopathy.  Psychiatric: She has a normal mood and affect. Her behavior is normal.          Assessment & Plan:

## 2012-04-28 NOTE — Assessment & Plan Note (Signed)
Doing well on the methadone Discussed that if she is not adequately controlled at some time, I would not want to increase methadone. Now she is doing well though

## 2012-04-28 NOTE — Assessment & Plan Note (Signed)
BP Readings from Last 3 Encounters:  04/28/12 118/78  12/20/11 120/70  08/13/11 138/60   Good control No changes needed

## 2012-04-28 NOTE — Assessment & Plan Note (Signed)
Mood has been good Continue meds indefinitely

## 2012-04-28 NOTE — Assessment & Plan Note (Signed)
Lab Results  Component Value Date   HGBA1C 6.9* 08/13/2011   Still seems to have good control despite lower med doses If A1c still on low side, will decrease glimiperide to 62m daily Needs eye exam

## 2012-05-04 ENCOUNTER — Encounter: Payer: Self-pay | Admitting: *Deleted

## 2012-05-19 ENCOUNTER — Other Ambulatory Visit: Payer: Self-pay

## 2012-05-19 MED ORDER — METHADONE HCL 10 MG PO TABS: ORAL_TABLET | ORAL | Status: DC

## 2012-05-19 NOTE — Telephone Encounter (Signed)
Printed, may pick up.

## 2012-05-19 NOTE — Telephone Encounter (Signed)
Left message on machine that rx is ready for pick-up, and it will be at our front desk.  

## 2012-05-19 NOTE — Telephone Encounter (Signed)
Pt left v/m requesting rx Methadone for back pain. Call when ready for pick up. Please advise.

## 2012-06-17 ENCOUNTER — Other Ambulatory Visit: Payer: Self-pay

## 2012-06-17 MED ORDER — METHADONE HCL 10 MG PO TABS: ORAL_TABLET | ORAL | Status: DC

## 2012-06-17 NOTE — Telephone Encounter (Signed)
Spoke with patient and advised rx ready for pick-up and it will be at the front desk.

## 2012-06-17 NOTE — Telephone Encounter (Signed)
Pt request rx Methadone 10 mg. Call when ready for pick up.

## 2012-07-07 ENCOUNTER — Encounter: Payer: Self-pay | Admitting: Internal Medicine

## 2012-07-07 ENCOUNTER — Ambulatory Visit (INDEPENDENT_AMBULATORY_CARE_PROVIDER_SITE_OTHER): Payer: Medicare Other | Admitting: Internal Medicine

## 2012-07-07 VITALS — BP 110/60 | HR 68 | Temp 98.0°F | Ht 62.0 in | Wt 148.0 lb

## 2012-07-07 DIAGNOSIS — K625 Hemorrhage of anus and rectum: Secondary | ICD-10-CM | POA: Insufficient documentation

## 2012-07-07 MED ORDER — HYDROCORTISONE 2.5 % RE CREA: TOPICAL_CREAM | RECTAL | Status: DC

## 2012-07-07 NOTE — Patient Instructions (Signed)
Please start a daily fiber supplement like citrucel, metamucil, fibercon, etc

## 2012-07-07 NOTE — Progress Notes (Signed)
Subjective:    Patient ID: Lindsay Harmon, female    DOB: 22-Jul-1956, 56 y.o.   MRN: [id number]  HPI Having some hemorrhoid problems--she can feel them Never had problems with this since she was pregnant Some fecal incontinence---leakage at times Got worse with miralax Hard time cleaning herself well At times seems to have mucus---clear slimy material  Red blood on toilet paper No blood in stool No black stools (except slight specks of darker material)  Eats fiber bars for fiber No other meds  Current Outpatient Prescriptions on File Prior to Visit  Medication Sig Dispense Refill  . ALPRAZolam (XANAX) 0.25 MG tablet Take 1 tablet (0.25 mg total) by mouth daily.  30 tablet  1  . aspirin 325 MG tablet Take 325 mg by mouth daily.        Marland Kitchen gabapentin (NEURONTIN) 300 MG capsule Take 1 capsule (300 mg total) by mouth 2 (two) times daily. then take 2 at bedtime daily  120 capsule  11  . glimepiride (AMARYL) 4 MG tablet Take 4 mg by mouth daily before breakfast.      . lisinopril (PRINIVIL,ZESTRIL) 20 MG tablet Take 1 tablet (20 mg total) by mouth daily.  30 tablet  11  . loratadine (CLARITIN) 10 MG tablet Take 10 mg by mouth 2 (two) times daily.       Marland Kitchen lovastatin (MEVACOR) 40 MG tablet Take 2 tablets by mouth daily  60 tablet  11  . metFORMIN (GLUCOPHAGE) 1000 MG tablet Take 500 mg by mouth 2 (two) times daily with a meal.      . methadone (DOLOPHINE) 10 MG tablet Take 2 tablets four times a day.  240 tablet  0  . metoprolol tartrate (LOPRESSOR) 25 MG tablet Take 1 tablet (25 mg total) by mouth 2 (two) times daily.  60 tablet  11  . mirtazapine (REMERON) 30 MG tablet Take 0.5-1 tablets (15-30 mg total) by mouth at bedtime.  30 tablet  11  . nitroGLYCERIN (NITROSTAT) 0.4 MG SL tablet Place 0.4 mg under the tongue every 5 (five) minutes as needed.        . pantoprazole (PROTONIX) 40 MG tablet Take 1 tablet (40 mg total) by mouth 2 (two) times daily.  60 tablet  11  . torsemide (DEMADEX) 20  MG tablet Take 2 tablets (40 mg total) by mouth 2 (two) times daily.  120 tablet  11    No Known Allergies  Past Medical History  Diagnosis Date  . Depression   . GERD (gastroesophageal reflux disease)   . Hyperlipidemia   . Hypertension   . PVD (peripheral vascular disease)   . CAD (coronary artery disease)   . Familial hematuria   . NIDDM (non-insulin dependent diabetes mellitus)     with neuropathy  . IBS (irritable bowel syndrome)   . Esophageal stricture   . Obesity, unspecified   . Nephrolithiasis   . Allergy   . Arthritis     Past Surgical History  Procedure Date  . Abdominal hysterectomy   . Cesarean section   . Carpal tunnel release     Family History  Problem Relation Age of Onset  . Diabetes Mother     History   Social History  . Marital Status: Legally Separated    Spouse Name: N/A    Number of Children: 1  . Years of Education: N/A   Occupational History  . 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 Smoking status: Former Research scientist (life sciences)  . Smokeless tobacco: Never Used  . Alcohol Use: No     heavy in the past  . Drug Use: Not on file  . Sexually Active: Not on file   Other Topics Concern  . Not on file   Social History Narrative   No living [place] should make decisions for her if she is unable. Would accept resuscitation attempts but no prolonged ventilationNot sure about tube feeds but probably wouldn't want them if cognitively aware   Review of Systems No abdominal pain Appetite is okay No nausea or vomiting    Objective:   Physical Exam  Constitutional: She appears well-developed and well-nourished. No distress.  Genitourinary:       No sig hemorrhoids Slight mucus at rectum Irritation with abraded area towards back (posteriorly)  No internal masses Stool brown and heme negative          Assessment & Plan:

## 2012-07-07 NOTE — Assessment & Plan Note (Addendum)
No evidence of internal GI bleed Is up to date on colonoscopy Really seems to be a local source---likely from wiping from leakage No constipation despite the narcotics Will start fiber supplement to hopefully decrease the leakage anusol HC cream prn

## 2012-07-09 ENCOUNTER — Other Ambulatory Visit: Payer: Self-pay | Admitting: *Deleted

## 2012-07-09 MED ORDER — NITROGLYCERIN 0.4 MG SL SUBL: 0.4000 mg | SUBLINGUAL_TABLET | SUBLINGUAL | Status: DC | PRN

## 2012-07-20 ENCOUNTER — Other Ambulatory Visit: Payer: Self-pay

## 2012-07-20 NOTE — Telephone Encounter (Signed)
Pt left v/m requesting methadone rx . Call when ready for pick up.

## 2012-07-21 MED ORDER — METHADONE HCL 10 MG PO TABS: ORAL_TABLET | ORAL | Status: DC

## 2012-07-21 NOTE — Telephone Encounter (Signed)
Left message on machine that rx is ready for pick-up, and it will be at our front desk.  

## 2012-08-05 ENCOUNTER — Other Ambulatory Visit: Payer: Self-pay | Admitting: *Deleted

## 2012-08-05 MED ORDER — MIRTAZAPINE 30 MG PO TABS: 15.0000 mg | ORAL_TABLET | Freq: Every day | ORAL | Status: DC

## 2012-08-05 NOTE — Telephone Encounter (Signed)
Okay to refill for a year

## 2012-08-05 NOTE — Telephone Encounter (Signed)
rx sent to pharmacy by e-script  

## 2012-08-18 ENCOUNTER — Other Ambulatory Visit: Payer: Self-pay | Admitting: *Deleted

## 2012-08-18 MED ORDER — METHADONE HCL 10 MG PO TABS: ORAL_TABLET | ORAL | Status: DC

## 2012-08-18 NOTE — Telephone Encounter (Signed)
Spoke with patient and advised rx ready for pick-up and it will be at the front desk.

## 2012-09-07 ENCOUNTER — Other Ambulatory Visit: Payer: Self-pay

## 2012-09-07 NOTE — Telephone Encounter (Signed)
Hyman Hopes faxed refill for alprazolam. Last filled 08/04/12.Please advise.

## 2012-09-08 ENCOUNTER — Encounter: Payer: Self-pay | Admitting: Internal Medicine

## 2012-09-08 ENCOUNTER — Ambulatory Visit (INDEPENDENT_AMBULATORY_CARE_PROVIDER_SITE_OTHER): Payer: Medicare Other | Admitting: Internal Medicine

## 2012-09-08 VITALS — BP 110/60 | HR 83 | Temp 98.6°F | Ht 60.0 in | Wt 149.0 lb

## 2012-09-08 DIAGNOSIS — R799 Abnormal finding of blood chemistry, unspecified: Secondary | ICD-10-CM

## 2012-09-08 DIAGNOSIS — F329 Major depressive disorder, single episode, unspecified: Secondary | ICD-10-CM

## 2012-09-08 DIAGNOSIS — F3289 Other specified depressive episodes: Secondary | ICD-10-CM

## 2012-09-08 DIAGNOSIS — I10 Essential (primary) hypertension: Secondary | ICD-10-CM

## 2012-09-08 DIAGNOSIS — Z0000 Encounter for general adult medical examination without abnormal findings: Secondary | ICD-10-CM

## 2012-09-08 DIAGNOSIS — E1149 Type 2 diabetes mellitus with other diabetic neurological complication: Secondary | ICD-10-CM

## 2012-09-08 DIAGNOSIS — M549 Dorsalgia, unspecified: Secondary | ICD-10-CM

## 2012-09-08 DIAGNOSIS — E785 Hyperlipidemia, unspecified: Secondary | ICD-10-CM

## 2012-09-08 DIAGNOSIS — R7989 Other specified abnormal findings of blood chemistry: Secondary | ICD-10-CM

## 2012-09-08 LAB — HEPATIC FUNCTION PANEL
ALT: 9 U/L (ref 0–35)
Alkaline Phosphatase: 110 U/L (ref 39–117)
Bilirubin, Direct: 0 mg/dL (ref 0.0–0.3)
Total Bilirubin: 0.3 mg/dL (ref 0.3–1.2)
Total Protein: 7.7 g/dL (ref 6.0–8.3)

## 2012-09-08 LAB — LIPID PANEL
Total CHOL/HDL Ratio: 5
Triglycerides: 165 mg/dL — ABNORMAL HIGH (ref 0.0–149.0)

## 2012-09-08 LAB — CBC WITH DIFFERENTIAL/PLATELET
Basophils Absolute: 0.1 10*3/uL (ref 0.0–0.1)
Basophils Relative: 0.8 % (ref 0.0–3.0)
Eosinophils Relative: 2.5 % (ref 0.0–5.0)
Hemoglobin: 10.9 g/dL — ABNORMAL LOW (ref 12.0–15.0)
Lymphocytes Relative: 24.9 % (ref 12.0–46.0)
Monocytes Relative: 5.6 % (ref 3.0–12.0)
Neutro Abs: 7.7 10*3/uL (ref 1.4–7.7)
Neutrophils Relative %: 66.2 % (ref 43.0–77.0)
RBC: 4.67 Mil/uL (ref 3.87–5.11)

## 2012-09-08 LAB — MICROALBUMIN / CREATININE URINE RATIO
Creatinine,U: 26.1 mg/dL
Microalb Creat Ratio: 3.1 mg/g (ref 0.0–30.0)

## 2012-09-08 LAB — BASIC METABOLIC PANEL
CO2: 32 mEq/L (ref 19–32)
Chloride: 98 mEq/L (ref 96–112)
Potassium: 4.7 mEq/L (ref 3.5–5.1)
Sodium: 139 mEq/L (ref 135–145)

## 2012-09-08 MED ORDER — ALPRAZOLAM 0.25 MG PO TABS: 0.2500 mg | ORAL_TABLET | Freq: Every day | ORAL | Status: DC

## 2012-09-08 NOTE — Assessment & Plan Note (Signed)
Mood fine Will continue meds

## 2012-09-08 NOTE — Assessment & Plan Note (Signed)
Doing fine with the stable methadone dose

## 2012-09-08 NOTE — Assessment & Plan Note (Signed)
Still seems to have excellent control Frequent low sugars If a1c still under 7.5% or so, will cut the glimiperide

## 2012-09-08 NOTE — Telephone Encounter (Signed)
rx called into pharmacy

## 2012-09-08 NOTE — Assessment & Plan Note (Signed)
No problems with the med Due for labs

## 2012-09-08 NOTE — Progress Notes (Signed)
Subjective:    Patient ID: Lindsay Harmon, female    DOB: 1956-09-09, 56 y.o.   MRN: [id number]  HPI Here for Medicare wellness and follow up Reviewed advanced directives No depression or anhedonia Only sees eye doctor---due for appt soon Independent with ADL's and instrumental ADLs. Still drives No falls or instability No memory problems No exercise  Pain control is still okay Has some bad days but generally satisfied  Checks sugars fasting daily 46-134 Most are under 100 Sometimes has mild symptoms when low---nothing really bothersome but will get some blurry vision or sweats  Mood generally good No sig anxiety Sleeps fairly well  Still on protonix Controls her reflux  Current Outpatient Prescriptions on File Prior to Visit  Medication Sig Dispense Refill  . ALPRAZolam (XANAX) 0.25 MG tablet Take 1 tablet (0.25 mg total) by mouth daily.  30 tablet  1  . aspirin 325 MG tablet Take 325 mg by mouth daily.        Marland Kitchen gabapentin (NEURONTIN) 300 MG capsule Take 1 capsule (300 mg total) by mouth 2 (two) times daily. then take 2 at bedtime daily  120 capsule  11  . glimepiride (AMARYL) 4 MG tablet Take 4 mg by mouth daily before breakfast.      . lisinopril (PRINIVIL,ZESTRIL) 20 MG tablet Take 1 tablet (20 mg total) by mouth daily.  30 tablet  11  . loratadine (CLARITIN) 10 MG tablet Take 10 mg by mouth 2 (two) times daily.       Marland Kitchen lovastatin (MEVACOR) 40 MG tablet Take 2 tablets by mouth daily  60 tablet  11  . metFORMIN (GLUCOPHAGE) 1000 MG tablet Take 500 mg by mouth 2 (two) times daily with a meal.      . methadone (DOLOPHINE) 10 MG tablet Take 2 tablets four times a day.  240 tablet  0  . metoprolol tartrate (LOPRESSOR) 25 MG tablet Take 1 tablet (25 mg total) by mouth 2 (two) times daily.  60 tablet  11  . mirtazapine (REMERON) 30 MG tablet Take 0.5-1 tablets (15-30 mg total) by mouth at bedtime.  30 tablet  11  . nitroGLYCERIN (NITROSTAT) 0.4 MG SL tablet Place 1 tablet (0.4  mg total) under the tongue every 5 (five) minutes as needed.  25 tablet  11  . pantoprazole (PROTONIX) 40 MG tablet Take 1 tablet (40 mg total) by mouth 2 (two) times daily.  60 tablet  11  . torsemide (DEMADEX) 20 MG tablet Take 2 tablets (40 mg total) by mouth 2 (two) times daily.  120 tablet  11    No Known Allergies  Past Medical History  Diagnosis Date  . Depression   . GERD (gastroesophageal reflux disease)   . Hyperlipidemia   . Hypertension   . PVD (peripheral vascular disease)   . CAD (coronary artery disease)   . Familial hematuria   . NIDDM (non-insulin dependent diabetes mellitus)     with neuropathy  . IBS (irritable bowel syndrome)   . Esophageal stricture   . Obesity, unspecified   . Nephrolithiasis   . Allergy   . Arthritis     Past Surgical History  Procedure Date  . Abdominal hysterectomy   . Cesarean section   . Carpal tunnel release     Family History  Problem Relation Age of Onset  . Diabetes Mother     History   Social History  . Marital Status: Legally Separated    Spouse Name: N/A  Number of Children: 1  . Years of Education: N/A   Occupational History  . 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 Smokeless tobacco: Never Used  . Alcohol Use: No     Comment: heavy in the past  . Drug Use: Not on file  . Sexually Active: Not on file   Other Topics Concern  . Not on file   Social History Narrative   No living [place] should make decisions for her if she is unable. Would accept resuscitation attempts but no prolonged ventilationNot sure about tube feeds but probably wouldn't want them if cognitively unaware   Review of Systems Weight is stable Bowels are fine Using fiberchoice and this has helped constipation No further blood seen Voids without problem     Objective:   Physical Exam  Constitutional: She is oriented to person, place, and time. She appears  well-developed and well-nourished. No distress.  Neck: Normal range of motion. Neck supple. No thyromegaly present.  Cardiovascular: Normal rate, regular rhythm, normal heart sounds and intact distal pulses.  Exam reveals no gallop.        Faint pedal pulses ??very faint systolic murmur along left sternal border  Pulmonary/Chest: Effort normal and breath sounds normal. No respiratory distress. She has no wheezes. She has no rales.  Abdominal: Soft. There is no tenderness.  Musculoskeletal: She exhibits no edema and no tenderness.       Varicosities in feet/ankles  Lymphadenopathy:    She has no cervical adenopathy.  Neurological: She is alert and oriented to person, place, and time.       President-- "Obama, Bush, Clinton" [phone] [id number] Recall 3/3  Skin: No rash noted. No erythema.  Psychiatric: She has a normal mood and affect. Her behavior is normal.          Assessment & Plan:

## 2012-09-08 NOTE — Assessment & Plan Note (Signed)
I have personally reviewed the Medicare Annual Wellness questionnaire and have noted 1. The patient's medical and social history 2. Their use of alcohol, tobacco or illicit drugs 3. Their current medications and supplements 4. The patient's functional ability including ADL's, fall risks, home safety risks and hearing or visual             impairment. 5. Diet and physical activities 6. Evidence for depression or mood disorders  The patients weight, height, BMI and visual acuity have been recorded in the chart I have made referrals, counseling and provided education to the patient based review of the above and I have provided the pt with a written personalized care plan for preventive services.  I have provided you with a copy of your personalized plan for preventive services. Please take the time to review along with your updated medication list.  Will defer mammo till next year-- every 2 years Flu shot given Discussed trying to exercise

## 2012-09-08 NOTE — Telephone Encounter (Signed)
Okay #30 x 1 

## 2012-09-08 NOTE — Assessment & Plan Note (Signed)
BP Readings from Last 3 Encounters:  09/08/12 110/60  07/07/12 110/60  04/28/12 118/78   Good control No changes needed

## 2012-09-10 ENCOUNTER — Encounter: Payer: Self-pay | Admitting: *Deleted

## 2012-09-11 ENCOUNTER — Telehealth: Payer: Self-pay

## 2012-09-11 MED ORDER — GLIMEPIRIDE 2 MG PO TABS: 2.0000 mg | ORAL_TABLET | Freq: Every day | ORAL | Status: DC

## 2012-09-11 NOTE — Telephone Encounter (Signed)
Tell her thanks---I thought I might forget She should decrease from 4 to 2 mg daily Can just take 1/2 tab

## 2012-09-11 NOTE — Telephone Encounter (Signed)
Spoke with patient and advised results rx sent to pharmacy by e-script  

## 2012-09-11 NOTE — Telephone Encounter (Signed)
Pt got letter re: lab results. On 09/08/12 OV pt understood if A1C was below 7.5 the Amaryl dose would be adjusted. With the letter there was no change in dosage for Amaryl; pt wants to verify no change.Please advise.

## 2012-09-18 ENCOUNTER — Other Ambulatory Visit: Payer: Self-pay

## 2012-09-18 MED ORDER — METHADONE HCL 10 MG PO TABS: ORAL_TABLET | ORAL | Status: DC

## 2012-09-18 NOTE — Telephone Encounter (Signed)
Pt request methadone rx. Call when ready for pick up.

## 2012-09-18 NOTE — Telephone Encounter (Signed)
Left message on machine that rx is ready for pick-up, and it will be at our front desk.  

## 2012-10-07 ENCOUNTER — Other Ambulatory Visit: Payer: Self-pay | Admitting: *Deleted

## 2012-10-07 MED ORDER — LISINOPRIL 20 MG PO TABS: 20.0000 mg | ORAL_TABLET | Freq: Every day | ORAL | Status: DC

## 2012-10-14 ENCOUNTER — Other Ambulatory Visit (INDEPENDENT_AMBULATORY_CARE_PROVIDER_SITE_OTHER): Payer: Medicare Other

## 2012-10-14 ENCOUNTER — Other Ambulatory Visit: Payer: Self-pay

## 2012-10-14 DIAGNOSIS — D649 Anemia, unspecified: Secondary | ICD-10-CM

## 2012-10-14 LAB — FERRITIN: Ferritin: 9.3 ng/mL — ABNORMAL LOW (ref 10.0–291.0)

## 2012-10-14 LAB — IBC PANEL
Iron: 15 ug/dL — ABNORMAL LOW (ref 42–145)
Saturation Ratios: 3.1 % — ABNORMAL LOW (ref 20.0–50.0)
Transferrin: 343.4 mg/dL (ref 212.0–360.0)

## 2012-10-14 NOTE — Telephone Encounter (Signed)
Pt left note requesting rx methadone. Call when ready for pick up.

## 2012-10-15 MED ORDER — METHADONE HCL 10 MG PO TABS: ORAL_TABLET | ORAL | Status: DC

## 2012-10-15 NOTE — Telephone Encounter (Signed)
Left message on machine that rx is ready for pick-up, and it will be at our front desk.

## 2012-11-04 ENCOUNTER — Encounter: Payer: Self-pay | Admitting: *Deleted

## 2012-11-06 ENCOUNTER — Other Ambulatory Visit: Payer: Self-pay | Admitting: *Deleted

## 2012-11-06 MED ORDER — ALPRAZOLAM 0.25 MG PO TABS: 0.2500 mg | ORAL_TABLET | Freq: Every day | ORAL | Status: DC | PRN

## 2012-11-06 NOTE — Telephone Encounter (Signed)
Okay #30 x 1 Find out if this should really be daily prn

## 2012-11-06 NOTE — Telephone Encounter (Signed)
rx called into pharmacy

## 2012-11-17 ENCOUNTER — Other Ambulatory Visit: Payer: Self-pay | Admitting: Family Medicine

## 2012-11-17 MED ORDER — METHADONE HCL 10 MG PO TABS: ORAL_TABLET | ORAL | Status: DC

## 2012-11-17 NOTE — Telephone Encounter (Signed)
Pt requesting refill on methadone.  Call pt when ready for pick up.

## 2012-11-17 NOTE — Telephone Encounter (Signed)
Spoke with patient and advised rx ready for pick-up and it will be at the front desk.

## 2012-11-18 ENCOUNTER — Other Ambulatory Visit: Payer: Self-pay | Admitting: *Deleted

## 2012-11-18 MED ORDER — PANTOPRAZOLE SODIUM 40 MG PO TBEC: 40.0000 mg | DELAYED_RELEASE_TABLET | Freq: Two times a day (BID) | ORAL | Status: DC

## 2012-11-18 MED ORDER — NITROGLYCERIN 0.4 MG SL SUBL: 0.4000 mg | SUBLINGUAL_TABLET | SUBLINGUAL | Status: DC | PRN

## 2012-11-18 MED ORDER — ALPRAZOLAM 0.25 MG PO TABS: 0.2500 mg | ORAL_TABLET | Freq: Every day | ORAL | Status: DC | PRN

## 2012-11-18 MED ORDER — METFORMIN HCL 1000 MG PO TABS: 500.0000 mg | ORAL_TABLET | Freq: Two times a day (BID) | ORAL | Status: DC

## 2012-11-18 MED ORDER — LOVASTATIN 40 MG PO TABS: ORAL_TABLET | ORAL | Status: DC

## 2012-11-18 MED ORDER — GABAPENTIN 300 MG PO CAPS: 300.0000 mg | ORAL_CAPSULE | Freq: Two times a day (BID) | ORAL | Status: DC

## 2012-11-18 MED ORDER — MIRTAZAPINE 30 MG PO TABS: 15.0000 mg | ORAL_TABLET | Freq: Every day | ORAL | Status: DC

## 2012-11-18 MED ORDER — METOPROLOL TARTRATE 25 MG PO TABS: 25.0000 mg | ORAL_TABLET | Freq: Two times a day (BID) | ORAL | Status: DC

## 2012-11-18 MED ORDER — GLIMEPIRIDE 2 MG PO TABS: 2.0000 mg | ORAL_TABLET | Freq: Every day | ORAL | Status: DC

## 2012-11-18 MED ORDER — LISINOPRIL 20 MG PO TABS: 20.0000 mg | ORAL_TABLET | Freq: Every day | ORAL | Status: DC

## 2012-11-18 MED ORDER — TORSEMIDE 20 MG PO TABS: 40.0000 mg | ORAL_TABLET | Freq: Two times a day (BID) | ORAL | Status: DC

## 2012-11-18 NOTE — Telephone Encounter (Signed)
Pt wants 90 day rx's sent to [facility]

## 2012-11-18 NOTE — Telephone Encounter (Signed)
Okay to send mirtazapine #90 x 3 Alprazolam #90 x 0----will need to be printed for her

## 2012-11-18 NOTE — Telephone Encounter (Signed)
Spoke with patient and advised results Spoke with patient and advised Xanax rx ready for pick-up and it will be at the front desk.

## 2012-12-15 ENCOUNTER — Telehealth: Payer: Self-pay | Admitting: *Deleted

## 2012-12-15 NOTE — Telephone Encounter (Signed)
I'm waiting to here back from [facility]

## 2012-12-15 NOTE — Telephone Encounter (Signed)
Kim from [employer] [facility] [employer] calling about pt's request for quantity increase.  They faxed over a form on 12/14/12 and would like to it to be faxed back at [phone] or call [phone]

## 2012-12-15 NOTE — Telephone Encounter (Signed)
Please see form on your desk to be filled out for quantity limitation for Methadone.

## 2012-12-15 NOTE — Telephone Encounter (Signed)
Please check with the pharmacy We originally did 93m tabs years ago because they couldn't get 20's Can they get 20's now?  If so, will insurance cover #120 per month?

## 2012-12-15 NOTE — Telephone Encounter (Signed)
Spoke with the pharmacist at [facility] and he only has the 5 mg and 10 mg of Methadone. I also spoke with patient and let her know, she said to just let her know Dr.Letvak's decision.

## 2012-12-16 NOTE — Telephone Encounter (Signed)
Form faxed back

## 2012-12-16 NOTE — Telephone Encounter (Signed)
Form done 

## 2012-12-17 NOTE — Telephone Encounter (Signed)
Jasmine with [employer] left v/m that methadone quantity limit exception request was approved for 10 mg #240 for 30 days effective 12/16/12 and good for one year; approval letter will be sent also. Any questions call [employer] [phone].

## 2012-12-23 ENCOUNTER — Other Ambulatory Visit: Payer: Self-pay

## 2012-12-23 NOTE — Telephone Encounter (Signed)
Pt left v/m requesting rx methadone. Call when ready for pick up.

## 2012-12-24 MED ORDER — METHADONE HCL 10 MG PO TABS: ORAL_TABLET | ORAL | Status: DC

## 2012-12-24 NOTE — Telephone Encounter (Signed)
Left message on machine that rx is ready for pick-up, and it will be at our front desk.

## 2012-12-28 ENCOUNTER — Other Ambulatory Visit (INDEPENDENT_AMBULATORY_CARE_PROVIDER_SITE_OTHER): Payer: Medicare Other

## 2012-12-28 DIAGNOSIS — D649 Anemia, unspecified: Secondary | ICD-10-CM

## 2012-12-28 LAB — IBC PANEL: Saturation Ratios: 56.5 % — ABNORMAL HIGH (ref 20.0–50.0)

## 2012-12-31 ENCOUNTER — Encounter: Payer: Self-pay | Admitting: *Deleted

## 2013-01-20 ENCOUNTER — Other Ambulatory Visit: Payer: Self-pay

## 2013-01-20 MED ORDER — METHADONE HCL 10 MG PO TABS: ORAL_TABLET | ORAL | Status: DC

## 2013-01-20 NOTE — Telephone Encounter (Signed)
Pt left v/m requesting rx Methadone. Call when ready for pick up.

## 2013-01-20 NOTE — Telephone Encounter (Signed)
Spoke with patient and advised rx ready for pick-up and it will be at the front desk.  

## 2013-02-18 ENCOUNTER — Other Ambulatory Visit: Payer: Self-pay

## 2013-02-18 NOTE — Telephone Encounter (Signed)
Pt left v/m requesting rx methadone. Call when ready for pick up.

## 2013-02-19 MED ORDER — METHADONE HCL 10 MG PO TABS: ORAL_TABLET | ORAL | Status: DC

## 2013-02-19 NOTE — Telephone Encounter (Signed)
Spoke with patient and advised rx ready for pick-up and it will be at the front desk.  

## 2013-03-08 ENCOUNTER — Encounter: Payer: Self-pay | Admitting: Internal Medicine

## 2013-03-08 ENCOUNTER — Ambulatory Visit (INDEPENDENT_AMBULATORY_CARE_PROVIDER_SITE_OTHER): Payer: Medicare Other | Admitting: Internal Medicine

## 2013-03-08 VITALS — BP 130/68 | HR 80 | Temp 98.0°F | Wt 140.0 lb

## 2013-03-08 DIAGNOSIS — I10 Essential (primary) hypertension: Secondary | ICD-10-CM

## 2013-03-08 DIAGNOSIS — I2510 Atherosclerotic heart disease of native coronary artery without angina pectoris: Secondary | ICD-10-CM

## 2013-03-08 DIAGNOSIS — E1149 Type 2 diabetes mellitus with other diabetic neurological complication: Secondary | ICD-10-CM

## 2013-03-08 DIAGNOSIS — D649 Anemia, unspecified: Secondary | ICD-10-CM

## 2013-03-08 DIAGNOSIS — F329 Major depressive disorder, single episode, unspecified: Secondary | ICD-10-CM

## 2013-03-08 DIAGNOSIS — M549 Dorsalgia, unspecified: Secondary | ICD-10-CM

## 2013-03-08 LAB — CBC WITH DIFFERENTIAL/PLATELET
Basophils Absolute: 0.1 10*3/uL (ref 0.0–0.1)
Basophils Relative: 0.5 % (ref 0.0–3.0)
Eosinophils Absolute: 0.2 10*3/uL (ref 0.0–0.7)
HCT: 53.7 % — ABNORMAL HIGH (ref 36.0–46.0)
Hemoglobin: 17.9 g/dL — ABNORMAL HIGH (ref 12.0–15.0)
Lymphocytes Relative: 24.7 % (ref 12.0–46.0)
Lymphs Abs: 2.8 10*3/uL (ref 0.7–4.0)
MCHC: 33.4 g/dL (ref 30.0–36.0)
MCV: 88.9 fl (ref 78.0–100.0)
Monocytes Absolute: 0.6 10*3/uL (ref 0.1–1.0)
Neutro Abs: 7.6 10*3/uL (ref 1.4–7.7)
RBC: 6.04 Mil/uL — ABNORMAL HIGH (ref 3.87–5.11)
RDW: 18 % — ABNORMAL HIGH (ref 11.5–14.6)

## 2013-03-08 NOTE — Assessment & Plan Note (Signed)
1 angina spell No changes in meds indicated

## 2013-03-08 NOTE — Assessment & Plan Note (Signed)
Stable on the methadone No changes

## 2013-03-08 NOTE — Progress Notes (Signed)
Subjective:    Patient ID: Lindsay Harmon, female    DOB: 1955/12/22, 57 y.o.   MRN: [id number]  HPI Doing well  Checking sugars every morning Has been uniformly low and has had some hypoglycemic reactions---mild Has eye appt coming up this month No pain in feet. Just mild numbness  Ongoing back and hip pain Satisfied with pain relief Still limited  No SOB Did have 1 chest pain episode---better with NTG. (at rest) Mild edema---not bad. Does keep them elevated No dizziness or sycnope  Mood has been fine No active depression of late New grandson---first  Current Outpatient Prescriptions on File Prior to Visit  Medication Sig Dispense Refill  . ALPRAZolam (XANAX) 0.25 MG tablet Take 1 tablet (0.25 mg total) by mouth daily as needed.  90 tablet  0  . aspirin 325 MG tablet Take 325 mg by mouth daily.        Marland Kitchen gabapentin (NEURONTIN) 300 MG capsule Take 1 capsule (300 mg total) by mouth 2 (two) times daily. then take 2 at bedtime daily  360 capsule  1  . glimepiride (AMARYL) 2 MG tablet Take 1 tablet (2 mg total) by mouth daily before breakfast.  90 tablet  1  . lisinopril (PRINIVIL,ZESTRIL) 20 MG tablet Take 1 tablet (20 mg total) by mouth daily.  90 tablet  1  . loratadine (CLARITIN) 10 MG tablet Take 10 mg by mouth 2 (two) times daily.       Marland Kitchen lovastatin (MEVACOR) 40 MG tablet Take 2 tablets by mouth daily  180 tablet  1  . metFORMIN (GLUCOPHAGE) 1000 MG tablet Take 0.5 tablets (500 mg total) by mouth 2 (two) times daily with a meal.  90 tablet  1  . methadone (DOLOPHINE) 10 MG tablet Take 2 tablets four times a day.  240 tablet  0  . metoprolol tartrate (LOPRESSOR) 25 MG tablet Take 1 tablet (25 mg total) by mouth 2 (two) times daily.  180 tablet  1  . mirtazapine (REMERON) 30 MG tablet Take 0.5-1 tablets (15-30 mg total) by mouth at bedtime.  90 tablet  3  . nitroGLYCERIN (NITROSTAT) 0.4 MG SL tablet Place 1 tablet (0.4 mg total) under the tongue every 5 (five) minutes as needed.   60 tablet  1  . Nutritional Supplements (CHOICE DM FIBER-BURST PO) Take by mouth daily.      . pantoprazole (PROTONIX) 40 MG tablet Take 1 tablet (40 mg total) by mouth 2 (two) times daily.  180 tablet  1  . torsemide (DEMADEX) 20 MG tablet Take 2 tablets (40 mg total) by mouth 2 (two) times daily.  360 tablet  1   No current facility-administered medications on file prior to visit.    No Known Allergies  Past Medical History  Diagnosis Date  . Depression   . GERD (gastroesophageal reflux disease)   . Hyperlipidemia   . Hypertension   . PVD (peripheral vascular disease)   . CAD (coronary artery disease)   . Familial hematuria   . NIDDM (non-insulin dependent diabetes mellitus)     with neuropathy  . IBS (irritable bowel syndrome)   . Esophageal stricture   . Obesity, unspecified   . Nephrolithiasis   . Allergy   . Arthritis     Past Surgical History  Procedure Laterality Date  . Abdominal hysterectomy    . Cesarean section    . Carpal tunnel release      Family History  Problem Relation Age of Onset  .  Diabetes Mother     History   Social History  . Marital Status: Legally Separated    Spouse Name: N/A    Number of Children: 1  . Years of Education: N/A   Occupational History  . 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 Smoking status: Former Research scientist (life sciences)  . Smokeless tobacco: Never Used  . Alcohol Use: No     Comment: heavy in the past  . Drug Use: Not on file  . Sexually Active: Not on file   Other Topics Concern  . Not on file   Social History Narrative   No living will   Son [name] should make decisions for her if she is unable.    Would accept resuscitation attempts but no prolonged ventilation   Not sure about tube feeds but probably wouldn't want them if cognitively unaware   Review of Systems Has lost 9# more since last visit Not really exercising but did order low impact exercise machine---walking will cause bad  pain Sleeping okay    Objective:   Physical Exam  Constitutional: She appears well-developed and well-nourished. No distress.  Neck: Normal range of motion. Neck supple. No thyromegaly present.  Cardiovascular: Normal rate, regular rhythm, normal heart sounds and intact distal pulses.  Exam reveals no gallop.   No murmur heard. Pulmonary/Chest: Effort normal and breath sounds normal. No respiratory distress. She has no wheezes. She has no rales.  Musculoskeletal: She exhibits no edema and no tenderness.  Dilated veins in calves and feet and early stasis changes  Lymphadenopathy:    She has no cervical adenopathy.  Skin: No rash noted. No erythema.  No foot lesions  Psychiatric: She has a normal mood and affect. Her behavior is normal.          Assessment & Plan:

## 2013-03-08 NOTE — Assessment & Plan Note (Signed)
Has had continued weight loss Some hypoglycemia May need to decrease or stop the glimiperide

## 2013-03-08 NOTE — Assessment & Plan Note (Signed)
Still on iron If better, will decrease to once or twice a week

## 2013-03-08 NOTE — Assessment & Plan Note (Signed)
Mood okay Chronic so will continue meds

## 2013-03-08 NOTE — Assessment & Plan Note (Signed)
BP Readings from Last 3 Encounters:  03/08/13 130/68  09/08/12 110/60  07/07/12 110/60   Good control still No changes needed

## 2013-03-10 ENCOUNTER — Encounter: Payer: Self-pay | Admitting: *Deleted

## 2013-03-11 ENCOUNTER — Telehealth: Payer: Self-pay

## 2013-03-11 NOTE — Telephone Encounter (Signed)
Pt returning call and requesting 03/08/13 lab results. Pt notified as instructed from result note.s

## 2013-03-19 ENCOUNTER — Other Ambulatory Visit: Payer: Self-pay

## 2013-03-19 NOTE — Telephone Encounter (Signed)
Pt request rx methadone. Call when ready for pick up. Pt has med to last thru 03/23/13.

## 2013-03-23 MED ORDER — METHADONE HCL 10 MG PO TABS: ORAL_TABLET | ORAL | Status: DC

## 2013-03-23 NOTE — Telephone Encounter (Signed)
Left message on machine that rx is ready for pick-up, and it will be at our front desk.  

## 2013-03-24 ENCOUNTER — Encounter: Payer: Self-pay | Admitting: Internal Medicine

## 2013-03-24 NOTE — Telephone Encounter (Signed)
Pt called for status of methadone rx. Advised as instructed.

## 2013-04-02 ENCOUNTER — Other Ambulatory Visit: Payer: Self-pay | Admitting: *Deleted

## 2013-04-02 MED ORDER — METFORMIN HCL 1000 MG PO TABS: 500.0000 mg | ORAL_TABLET | Freq: Two times a day (BID) | ORAL | Status: DC

## 2013-04-02 MED ORDER — PANTOPRAZOLE SODIUM 40 MG PO TBEC: 40.0000 mg | DELAYED_RELEASE_TABLET | Freq: Two times a day (BID) | ORAL | Status: DC

## 2013-04-02 MED ORDER — LOVASTATIN 40 MG PO TABS: ORAL_TABLET | ORAL | Status: DC

## 2013-04-02 MED ORDER — METOPROLOL TARTRATE 25 MG PO TABS: 25.0000 mg | ORAL_TABLET | Freq: Two times a day (BID) | ORAL | Status: DC

## 2013-04-02 MED ORDER — GABAPENTIN 300 MG PO CAPS: 300.0000 mg | ORAL_CAPSULE | Freq: Two times a day (BID) | ORAL | Status: DC

## 2013-04-02 MED ORDER — LISINOPRIL 20 MG PO TABS: 20.0000 mg | ORAL_TABLET | Freq: Every day | ORAL | Status: DC

## 2013-04-05 ENCOUNTER — Encounter: Payer: Self-pay | Admitting: Internal Medicine

## 2013-04-09 ENCOUNTER — Other Ambulatory Visit: Payer: Self-pay | Admitting: *Deleted

## 2013-04-09 MED ORDER — ALPRAZOLAM 0.25 MG PO TABS: 0.2500 mg | ORAL_TABLET | Freq: Every day | ORAL | Status: DC | PRN

## 2013-04-09 NOTE — Telephone Encounter (Signed)
Faxed refill request for alprazolam from [facility], last filled 90 on 01/13/13.

## 2013-04-09 NOTE — Telephone Encounter (Signed)
Dr. Alla German patient

## 2013-04-09 NOTE — Telephone Encounter (Signed)
Please call in

## 2013-04-12 NOTE — Telephone Encounter (Signed)
Medication phoned to pharmacy.

## 2013-04-19 ENCOUNTER — Other Ambulatory Visit: Payer: Self-pay

## 2013-04-19 MED ORDER — METHADONE HCL 10 MG PO TABS
ORAL_TABLET | ORAL | Status: DC
Start: 2013-04-19 — End: 2013-05-18

## 2013-04-19 NOTE — Telephone Encounter (Signed)
Pt left v/m requesting rx methadone. Call when ready for pick up.

## 2013-04-19 NOTE — Telephone Encounter (Signed)
Left message on machine that rx is ready for pick-up, and it will be at our front desk.  

## 2013-05-06 ENCOUNTER — Other Ambulatory Visit: Payer: Self-pay

## 2013-05-18 ENCOUNTER — Other Ambulatory Visit: Payer: Self-pay

## 2013-05-18 MED ORDER — METHADONE HCL 10 MG PO TABS: ORAL_TABLET | ORAL | Status: DC

## 2013-05-18 NOTE — Telephone Encounter (Signed)
Spoke with patient and advised rx ready for pick-up and it will be at the front desk.  

## 2013-05-18 NOTE — Telephone Encounter (Signed)
pt left v/m requesting rx methadone. Call when ready for pick up.

## 2013-06-21 ENCOUNTER — Other Ambulatory Visit: Payer: Self-pay

## 2013-06-21 MED ORDER — METHADONE HCL 10 MG PO TABS: ORAL_TABLET | ORAL | Status: DC

## 2013-06-21 NOTE — Telephone Encounter (Signed)
Spoke with patient and advised rx ready for pick-up and it will be at the front desk.  

## 2013-06-21 NOTE — Telephone Encounter (Signed)
Pt left /vm requesting rx methadone. Call when ready for pick up. 

## 2013-07-09 ENCOUNTER — Other Ambulatory Visit: Payer: Self-pay | Admitting: *Deleted

## 2013-07-09 MED ORDER — ALPRAZOLAM 0.25 MG PO TABS: 0.2500 mg | ORAL_TABLET | Freq: Every day | ORAL | Status: DC | PRN

## 2013-07-09 NOTE — Telephone Encounter (Signed)
rx called into pharmacy

## 2013-07-09 NOTE — Telephone Encounter (Signed)
Okay #90 x 0 

## 2013-07-09 NOTE — Telephone Encounter (Signed)
Last filled 04/12/13

## 2013-07-10 ENCOUNTER — Emergency Department: Payer: Self-pay | Admitting: Emergency Medicine

## 2013-07-12 ENCOUNTER — Ambulatory Visit (INDEPENDENT_AMBULATORY_CARE_PROVIDER_SITE_OTHER): Payer: Medicare Other | Admitting: Internal Medicine

## 2013-07-12 ENCOUNTER — Encounter: Payer: Self-pay | Admitting: Internal Medicine

## 2013-07-12 VITALS — BP 150/80 | HR 94 | Temp 98.5°F | Wt 137.0 lb

## 2013-07-12 DIAGNOSIS — L255 Unspecified contact dermatitis due to plants, except food: Secondary | ICD-10-CM

## 2013-07-12 DIAGNOSIS — Z23 Encounter for immunization: Secondary | ICD-10-CM

## 2013-07-12 MED ORDER — PREDNISONE 10 MG PO TABS: 40.0000 mg | ORAL_TABLET | Freq: Every day | ORAL | Status: DC

## 2013-07-12 MED ORDER — METHYLPREDNISOLONE ACETATE 80 MG/ML IJ SUSP
80.0000 mg | Freq: Once | INTRAMUSCULAR | Status: AC
  Administered 2013-07-12: 80 mg via INTRAMUSCULAR

## 2013-07-12 NOTE — Progress Notes (Signed)
Subjective:    Patient ID: Lindsay Harmon, female    DOB: Aug 10, 1956, 57 y.o.   MRN: [id number]  HPI Lindsay Harmon to ER for contact dermatitis Contact vine in her beds -- exposed 3 different days (9/8-10) Probably poison ivy  Bad swelling in right arm, left ear and neck Shoulder also  Rx for medrol dose pack On the 4 pills today Seems to be getting worse  Current Outpatient Prescriptions on File Prior to Visit  Medication Sig Dispense Refill  . ALPRAZolam (XANAX) 0.25 MG tablet Take 1 tablet (0.25 mg total) by mouth daily as needed.  90 tablet  0  . aspirin 325 MG tablet Take 325 mg by mouth daily.        Marland Kitchen gabapentin (NEURONTIN) 300 MG capsule Take 1 capsule (300 mg total) by mouth 2 (two) times daily. then take 2 at bedtime daily  360 capsule  1  . glimepiride (AMARYL) 2 MG tablet Take 1 tablet (2 mg total) by mouth daily before breakfast.  90 tablet  1  . lisinopril (PRINIVIL,ZESTRIL) 20 MG tablet Take 1 tablet (20 mg total) by mouth daily.  90 tablet  3  . loratadine (CLARITIN) 10 MG tablet Take 10 mg by mouth 2 (two) times daily.       Marland Kitchen lovastatin (MEVACOR) 40 MG tablet Take 2 tablets by mouth daily  180 tablet  3  . metFORMIN (GLUCOPHAGE) 1000 MG tablet Take 0.5 tablets (500 mg total) by mouth 2 (two) times daily with a meal.  90 tablet  3  . methadone (DOLOPHINE) 10 MG tablet Take 2 tablets four times a day.  240 tablet  0  . metoprolol tartrate (LOPRESSOR) 25 MG tablet Take 1 tablet (25 mg total) by mouth 2 (two) times daily.  180 tablet  3  . mirtazapine (REMERON) 30 MG tablet Take 0.5-1 tablets (15-30 mg total) by mouth at bedtime.  90 tablet  3  . nitroGLYCERIN (NITROSTAT) 0.4 MG SL tablet Place 1 tablet (0.4 mg total) under the tongue every 5 (five) minutes as needed.  60 tablet  1  . Nutritional Supplements (CHOICE DM FIBER-BURST PO) Take by mouth daily.      . pantoprazole (PROTONIX) 40 MG tablet Take 1 tablet (40 mg total) by mouth 2 (two) times daily.  180 tablet  3  .  torsemide (DEMADEX) 20 MG tablet Take 2 tablets (40 mg total) by mouth 2 (two) times daily.  360 tablet  1   No current facility-administered medications on file prior to visit.    No Known Allergies  Past Medical History  Diagnosis Date  . Depression   . GERD (gastroesophageal reflux disease)   . Hyperlipidemia   . Hypertension   . PVD (peripheral vascular disease)   . CAD (coronary artery disease)   . Familial hematuria   . NIDDM (non-insulin dependent diabetes mellitus)     with neuropathy  . IBS (irritable bowel syndrome)   . Esophageal stricture   . Obesity, unspecified   . Nephrolithiasis   . Allergy   . Arthritis     Past Surgical History  Procedure Laterality Date  . Abdominal hysterectomy    . Cesarean section    . Carpal tunnel release      Family History  Problem Relation Age of Onset  . Diabetes Mother     History   Social History  . Marital Status: Legally Separated    Spouse Name: N/A    Number of Children:  1  . Years of Education: N/A   Occupational History  . 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 Smokeless tobacco: Never Used  . Alcohol Use: No     Comment: heavy in the past  . Drug Use: Not on file  . Sexual Activity: Not on file   Other Topics Concern  . Not on file   Social History Narrative   No living will   Son [name] should make decisions for her if she is unable.    Would accept resuscitation attempts but no prolonged ventilation   Not sure about tube feeds but probably wouldn't want them if cognitively unaware    Review of Systems No fever No SOB    Objective:   Physical Exam  Constitutional: She appears well-developed and well-nourished. No distress.  Skin:  Redness and swelling along right arm Significant in left ear and left neck and shoulder Some on chest          Assessment & Plan:

## 2013-07-12 NOTE — Assessment & Plan Note (Signed)
Still flaring through the medrol pack Will give depomedrol 80IM Higher dose prednisone

## 2013-07-12 NOTE — Addendum Note (Signed)
Addended by: Despina Hidden on: 07/12/2013 05:48 PM   Modules accepted: Orders

## 2013-07-15 ENCOUNTER — Encounter: Payer: Self-pay | Admitting: Internal Medicine

## 2013-07-15 ENCOUNTER — Ambulatory Visit (INDEPENDENT_AMBULATORY_CARE_PROVIDER_SITE_OTHER): Payer: Medicare Other | Admitting: Internal Medicine

## 2013-07-15 VITALS — BP 150/66 | HR 60 | Temp 98.5°F

## 2013-07-15 DIAGNOSIS — L255 Unspecified contact dermatitis due to plants, except food: Secondary | ICD-10-CM

## 2013-07-15 MED ORDER — METHYLPREDNISOLONE ACETATE 40 MG/ML IJ SUSP
120.0000 mg | Freq: Once | INTRAMUSCULAR | Status: AC
  Administered 2013-07-15: 120 mg via INTRAMUSCULAR

## 2013-07-15 MED ORDER — METHADONE HCL 10 MG PO TABS
ORAL_TABLET | ORAL | Status: DC
Start: 2013-07-15 — End: 2013-08-24

## 2013-07-15 NOTE — Assessment & Plan Note (Signed)
Some new lesions but other areas look better Still looks like classic contact derm  Will continue the prednisone depomedrol again

## 2013-07-15 NOTE — Progress Notes (Signed)
Subjective:    Patient ID: Lindsay Harmon, female    DOB: May 13, 1956, 57 y.o.   MRN: [id number]  HPI It was getting better Less red and angry Now seems to be spreading again On face now, tops of thighs and feet  Still taking the prednisone  Current Outpatient Prescriptions on File Prior to Visit  Medication Sig Dispense Refill  . ALPRAZolam (XANAX) 0.25 MG tablet Take 1 tablet (0.25 mg total) by mouth daily as needed.  90 tablet  0  . aspirin 325 MG tablet Take 325 mg by mouth daily.        Marland Kitchen gabapentin (NEURONTIN) 300 MG capsule Take 1 capsule (300 mg total) by mouth 2 (two) times daily. then take 2 at bedtime daily  360 capsule  1  . glimepiride (AMARYL) 2 MG tablet Take 1 tablet (2 mg total) by mouth daily before breakfast.  90 tablet  1  . lisinopril (PRINIVIL,ZESTRIL) 20 MG tablet Take 1 tablet (20 mg total) by mouth daily.  90 tablet  3  . loratadine (CLARITIN) 10 MG tablet Take 10 mg by mouth 2 (two) times daily.       Marland Kitchen lovastatin (MEVACOR) 40 MG tablet Take 2 tablets by mouth daily  180 tablet  3  . metFORMIN (GLUCOPHAGE) 1000 MG tablet Take 0.5 tablets (500 mg total) by mouth 2 (two) times daily with a meal.  90 tablet  3  . methadone (DOLOPHINE) 10 MG tablet Take 2 tablets four times a day.  240 tablet  0  . metoprolol tartrate (LOPRESSOR) 25 MG tablet Take 1 tablet (25 mg total) by mouth 2 (two) times daily.  180 tablet  3  . mirtazapine (REMERON) 30 MG tablet Take 0.5-1 tablets (15-30 mg total) by mouth at bedtime.  90 tablet  3  . nitroGLYCERIN (NITROSTAT) 0.4 MG SL tablet Place 1 tablet (0.4 mg total) under the tongue every 5 (five) minutes as needed.  60 tablet  1  . Nutritional Supplements (CHOICE DM FIBER-BURST PO) Take by mouth daily.      . pantoprazole (PROTONIX) 40 MG tablet Take 1 tablet (40 mg total) by mouth 2 (two) times daily.  180 tablet  3  . predniSONE (DELTASONE) 10 MG tablet Take 4 tablets (40 mg total) by mouth daily.  30 tablet  0  . torsemide (DEMADEX)  20 MG tablet Take 2 tablets (40 mg total) by mouth 2 (two) times daily.  360 tablet  1   No current facility-administered medications on file prior to visit.    No Known Allergies  Past Medical History  Diagnosis Date  . Depression   . GERD (gastroesophageal reflux disease)   . Hyperlipidemia   . Hypertension   . PVD (peripheral vascular disease)   . CAD (coronary artery disease)   . Familial hematuria   . NIDDM (non-insulin dependent diabetes mellitus)     with neuropathy  . IBS (irritable bowel syndrome)   . Esophageal stricture   . Obesity, unspecified   . Nephrolithiasis   . Allergy   . Arthritis     Past Surgical History  Procedure Laterality Date  . Abdominal hysterectomy    . Cesarean section    . Carpal tunnel release      Family History  Problem Relation Age of Onset  . Diabetes Mother     History   Social History  . Marital Status: Legally Separated    Spouse Name: N/A    Number of Children:  1  . Years of Education: N/A   Occupational History  . 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 Smokeless tobacco: Never Used  . Alcohol Use: No     Comment: heavy in the past  . Drug Use: Not on file  . Sexual Activity: Not on file   Other Topics Concern  . Not on file   Social History Narrative   No living will   Son [name] should make decisions for her if she is unable.    Would accept resuscitation attempts but no prolonged ventilation   Not sure about tube feeds but probably wouldn't want them if cognitively unaware   Review of Systems No fever No nausea Eating okay    Objective:   Physical Exam  Constitutional: She appears well-developed and well-nourished. No distress.  Skin:  New classic raised areas with excoriations on left anterior thigh and slight on right and on tops of feet Areas in shoulder and right antecubital fossa are some better          Assessment & Plan:

## 2013-07-15 NOTE — Addendum Note (Signed)
Addended by: Viviana Simpler I on: 07/15/2013 05:21 PM   Modules accepted: Orders

## 2013-07-15 NOTE — Addendum Note (Signed)
Addended by: Despina Hidden on: 07/15/2013 05:27 PM   Modules accepted: Orders

## 2013-07-22 LAB — HM DIABETES EYE EXAM

## 2013-08-18 ENCOUNTER — Encounter: Payer: Self-pay | Admitting: Internal Medicine

## 2013-08-24 ENCOUNTER — Other Ambulatory Visit: Payer: Self-pay

## 2013-08-24 MED ORDER — METHADONE HCL 10 MG PO TABS: ORAL_TABLET | ORAL | Status: DC

## 2013-08-24 NOTE — Telephone Encounter (Signed)
Pt left v/m requesting rx methadone. Call when ready for pick up.

## 2013-08-24 NOTE — Telephone Encounter (Signed)
Left message on machine that rx is ready for pick-up, and it will be at our front desk.  

## 2013-09-15 ENCOUNTER — Encounter: Payer: Self-pay | Admitting: Internal Medicine

## 2013-09-15 ENCOUNTER — Ambulatory Visit (INDEPENDENT_AMBULATORY_CARE_PROVIDER_SITE_OTHER): Payer: Medicare Other | Admitting: Internal Medicine

## 2013-09-15 ENCOUNTER — Telehealth: Payer: Self-pay | Admitting: Radiology

## 2013-09-15 VITALS — BP 150/80 | HR 85 | Temp 98.1°F | Ht 60.0 in | Wt 135.0 lb

## 2013-09-15 DIAGNOSIS — E1142 Type 2 diabetes mellitus with diabetic polyneuropathy: Secondary | ICD-10-CM

## 2013-09-15 DIAGNOSIS — I2510 Atherosclerotic heart disease of native coronary artery without angina pectoris: Secondary | ICD-10-CM

## 2013-09-15 DIAGNOSIS — E1149 Type 2 diabetes mellitus with other diabetic neurological complication: Secondary | ICD-10-CM

## 2013-09-15 DIAGNOSIS — I10 Essential (primary) hypertension: Secondary | ICD-10-CM

## 2013-09-15 DIAGNOSIS — M549 Dorsalgia, unspecified: Secondary | ICD-10-CM

## 2013-09-15 DIAGNOSIS — I739 Peripheral vascular disease, unspecified: Secondary | ICD-10-CM

## 2013-09-15 DIAGNOSIS — Z0000 Encounter for general adult medical examination without abnormal findings: Secondary | ICD-10-CM

## 2013-09-15 LAB — HEMOGLOBIN A1C: Hgb A1c MFr Bld: 6.9 % — ABNORMAL HIGH (ref 4.6–6.5)

## 2013-09-15 LAB — HEPATIC FUNCTION PANEL
ALT: 10 U/L (ref 0–35)
AST: 14 U/L (ref 0–37)
Alkaline Phosphatase: 93 U/L (ref 39–117)
Bilirubin, Direct: 0 mg/dL (ref 0.0–0.3)
Total Protein: 7.3 g/dL (ref 6.0–8.3)

## 2013-09-15 LAB — BASIC METABOLIC PANEL
CO2: 32 mEq/L (ref 19–32)
Calcium: 9.2 mg/dL (ref 8.4–10.5)
GFR: 44.84 mL/min — ABNORMAL LOW (ref >60.00)
Potassium: 4.2 mEq/L (ref 3.5–5.1)
Sodium: 137 mEq/L (ref 135–145)

## 2013-09-15 LAB — CBC WITH DIFFERENTIAL/PLATELET
Basophils Relative: 0.5 % (ref 0.0–3.0)
Eosinophils Relative: 2.7 % (ref 0.0–5.0)
Hemoglobin: 18 g/dL (ref 12.0–15.0)
Lymphocytes Relative: 19.3 % (ref 12.0–46.0)
Monocytes Relative: 6.1 % (ref 3.0–12.0)
Neutro Abs: 10.1 10*3/uL — ABNORMAL HIGH (ref 1.4–7.7)
RBC: 5.6 Mil/uL — ABNORMAL HIGH (ref 3.87–5.11)

## 2013-09-15 LAB — LIPID PANEL
HDL: 33.6 mg/dL — ABNORMAL LOW (ref >39.00)
VLDL: 52 mg/dL — ABNORMAL HIGH (ref 0.0–40.0)

## 2013-09-15 LAB — LDL CHOLESTEROL, DIRECT: Direct LDL: 89.4 mg/dL

## 2013-09-15 MED ORDER — METHADONE HCL 10 MG PO TABS: ORAL_TABLET | ORAL | Status: DC

## 2013-09-15 MED ORDER — GLIMEPIRIDE 2 MG PO TABS: 2.0000 mg | ORAL_TABLET | Freq: Every day | ORAL | Status: DC

## 2013-09-15 NOTE — Progress Notes (Signed)
Pre-visit discussion using our clinic review tool. No additional management support is needed unless otherwise documented below in the visit note.  

## 2013-09-15 NOTE — Assessment & Plan Note (Signed)
Seems to still have very good control Would cut glimipiride if sig hypoglycemic reactions

## 2013-09-15 NOTE — Telephone Encounter (Signed)
[facility] called HGB 18.0, HCT 54.8.

## 2013-09-15 NOTE — Progress Notes (Signed)
Subjective:    Patient ID: Lindsay Harmon, female    DOB: 01-24-1956, 57 y.o.   MRN: [id number]  HPI Here for physical  Checks sugars every morning It is often under 100 but she has only rare and mild low sugar reactions. She is able to take her glucose tabs without a problem Feet are mildly numb--no major pain issues on the gabapentin Fungus under toenails--they grow in peak and curl. Hard to cut. Wants to try some Rx  Heart has been fine Did have one spell of nitro and it was resolved--since last visit  Satisfied with pain control Remains on the methadone without any change in dose  Current Outpatient Prescriptions on File Prior to Visit  Medication Sig Dispense Refill  . ALPRAZolam (XANAX) 0.25 MG tablet Take 1 tablet (0.25 mg total) by mouth daily as needed.  90 tablet  0  . aspirin 325 MG tablet Take 325 mg by mouth daily.        Marland Kitchen gabapentin (NEURONTIN) 300 MG capsule Take 1 capsule (300 mg total) by mouth 2 (two) times daily. then take 2 at bedtime daily  360 capsule  1  . glimepiride (AMARYL) 2 MG tablet Take 1 tablet (2 mg total) by mouth daily before breakfast.  90 tablet  1  . lisinopril (PRINIVIL,ZESTRIL) 20 MG tablet Take 1 tablet (20 mg total) by mouth daily.  90 tablet  3  . loratadine (CLARITIN) 10 MG tablet Take 10 mg by mouth 2 (two) times daily.       Marland Kitchen lovastatin (MEVACOR) 40 MG tablet Take 2 tablets by mouth daily  180 tablet  3  . metFORMIN (GLUCOPHAGE) 1000 MG tablet Take 0.5 tablets (500 mg total) by mouth 2 (two) times daily with a meal.  90 tablet  3  . methadone (DOLOPHINE) 10 MG tablet Take 2 tablets four times a day.  240 tablet  0  . metoprolol tartrate (LOPRESSOR) 25 MG tablet Take 1 tablet (25 mg total) by mouth 2 (two) times daily.  180 tablet  3  . mirtazapine (REMERON) 30 MG tablet Take 0.5-1 tablets (15-30 mg total) by mouth at bedtime.  90 tablet  3  . nitroGLYCERIN (NITROSTAT) 0.4 MG SL tablet Place 1 tablet (0.4 mg total) under the tongue every 5  (five) minutes as needed.  60 tablet  1  . Nutritional Supplements (CHOICE DM FIBER-BURST PO) Take by mouth daily.      . pantoprazole (PROTONIX) 40 MG tablet Take 1 tablet (40 mg total) by mouth 2 (two) times daily.  180 tablet  3  . torsemide (DEMADEX) 20 MG tablet Take 2 tablets (40 mg total) by mouth 2 (two) times daily.  360 tablet  1   No current facility-administered medications on file prior to visit.    No Known Allergies  Past Medical History  Diagnosis Date  . Depression   . GERD (gastroesophageal reflux disease)   . Hyperlipidemia   . Hypertension   . PVD (peripheral vascular disease)   . CAD (coronary artery disease)   . Familial hematuria   . NIDDM (non-insulin dependent diabetes mellitus)     with neuropathy  . IBS (irritable bowel syndrome)   . Esophageal stricture   . Obesity, unspecified   . Nephrolithiasis   . Allergy   . Arthritis     Past Surgical History  Procedure Laterality Date  . Abdominal hysterectomy    . Cesarean section    . Carpal tunnel release  Family History  Problem Relation Age of Onset  . Diabetes Mother     History   Social History  . Marital Status: Legally Separated    Spouse Name: N/A    Number of Children: 1  . Years of Education: N/A   Occupational History  . 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 Smokeless tobacco: Never Used  . Alcohol Use: No     Comment: heavy in the past  . Drug Use: Not on file  . Sexual Activity: Not on file   Other Topics Concern  . Not on file   Social History Narrative   No living will   Son [name] should make decisions for her if she is unable.    Would accept resuscitation attempts but no prolonged ventilation   Not sure about tube feeds but probably wouldn't want them if cognitively unaware   Review of Systems  Constitutional: Negative for fatigue and unexpected weight change.       Wears seat belt  HENT:  Positive for congestion and dental problem. Negative for hearing loss, rhinorrhea and tinnitus.        Needs an extraction  Eyes: Negative for visual disturbance.       No diplopia or unilateral vision loss  Respiratory: Negative for cough, chest tightness and shortness of breath.   Cardiovascular: Positive for chest pain. Negative for palpitations and leg swelling.  Gastrointestinal: Negative for nausea, vomiting, abdominal pain, constipation and blood in stool.       Heartburn controlled with protonix  Endocrine: Positive for cold intolerance. Negative for heat intolerance.  Genitourinary: Negative for dysuria, hematuria and difficulty urinating.  Musculoskeletal: Positive for arthralgias and back pain. Negative for joint swelling.       Chronic back pain occ left hip aching and mild other joint pains  Skin: Negative for rash.       No suspicious lesions  Allergic/Immunologic: Positive for environmental allergies. Negative for immunocompromised state.       Loratadine helps  Neurological: Positive for numbness and headaches. Negative for dizziness, syncope, weakness and light-headedness.       Rare headaches  Hematological: Negative for adenopathy. Bruises/bleeds easily.  Psychiatric/Behavioral: Negative for sleep disturbance and dysphoric mood. The patient is not nervous/anxious.        Sleeping some better       Objective:   Physical Exam  Constitutional: She is oriented to person, place, and time. She appears well-developed and well-nourished. No distress.  HENT:  Head: Normocephalic and atraumatic.  Right Ear: External ear normal.  Left Ear: External ear normal.  Mouth/Throat: Oropharynx is clear and moist. No oropharyngeal exudate.  Upper denture  Eyes: Conjunctivae and EOM are normal. Pupils are equal, round, and reactive to light.  Neck: Normal range of motion. Neck supple. No thyromegaly present.  Cardiovascular: Normal rate, regular rhythm and normal heart sounds.  Exam  reveals no gallop.   No murmur heard. Feet are cool but have faint pulses  Pulmonary/Chest: Effort normal and breath sounds normal. No respiratory distress. She has no wheezes. She has no rales.  Abdominal: Soft. There is no tenderness.  Genitourinary:  Cystic changes bilaterally in breasts  R>>L No worrisome masses  Musculoskeletal: She exhibits no edema and no tenderness.  Lymphadenopathy:    She has no cervical adenopathy.    She has no axillary adenopathy.  Neurological: She is alert and oriented to  person, place, and time.  Decreased sensation in feet  Skin: No rash noted.  Mild thickening in nails but no clear fungal infection No ulcers  Psychiatric: She has a normal mood and affect. Her behavior is normal.          Assessment & Plan:

## 2013-09-15 NOTE — Patient Instructions (Addendum)
Please set up your screening mammogram. Please try Vick's vaporub on your toenails daily. Call if you feel that it is worse and you want to try prescription medication for this.

## 2013-09-15 NOTE — Assessment & Plan Note (Signed)
No claudication or skin ulcers No action now

## 2013-09-15 NOTE — Assessment & Plan Note (Signed)
Doing okay Due for mammogram

## 2013-09-15 NOTE — Assessment & Plan Note (Signed)
BP Readings from Last 3 Encounters:  09/15/13 150/80  07/15/13 150/66  07/12/13 150/80   Fairly stable On ACEI and beta blocker

## 2013-09-15 NOTE — Assessment & Plan Note (Signed)
With stable angina

## 2013-09-15 NOTE — Assessment & Plan Note (Signed)
Stable on the methadone

## 2013-09-15 NOTE — Telephone Encounter (Signed)
Will await the full report

## 2013-09-15 NOTE — Assessment & Plan Note (Signed)
Pain controlled with gabapentin

## 2013-09-16 ENCOUNTER — Encounter: Payer: Self-pay | Admitting: *Deleted

## 2013-10-05 ENCOUNTER — Other Ambulatory Visit: Payer: Self-pay | Admitting: *Deleted

## 2013-10-05 MED ORDER — ALPRAZOLAM 0.25 MG PO TABS: 0.2500 mg | ORAL_TABLET | Freq: Every day | ORAL | Status: DC | PRN

## 2013-10-05 NOTE — Telephone Encounter (Signed)
Received faxed refill request from pharmacy. Last office visit 09/15/13 and last refill 07/09/13 #90. Is it okay to refill medication?

## 2013-10-05 NOTE — Telephone Encounter (Signed)
Okay #90 x 0

## 2013-10-05 NOTE — Telephone Encounter (Signed)
rx called into pharmacy

## 2013-10-07 ENCOUNTER — Ambulatory Visit: Payer: Self-pay | Admitting: Internal Medicine

## 2013-10-07 ENCOUNTER — Encounter: Payer: Self-pay | Admitting: Internal Medicine

## 2013-10-08 ENCOUNTER — Encounter: Payer: Self-pay | Admitting: *Deleted

## 2013-10-18 ENCOUNTER — Other Ambulatory Visit: Payer: Self-pay

## 2013-10-18 MED ORDER — METHADONE HCL 10 MG PO TABS: ORAL_TABLET | ORAL | Status: DC

## 2013-10-18 NOTE — Telephone Encounter (Signed)
Pt left v/m requesting rx methadone. Call when ready for pick up.

## 2013-10-18 NOTE — Telephone Encounter (Signed)
Left message on machine that rx is ready for pick-up, and it will be at our front desk.  

## 2013-11-15 ENCOUNTER — Other Ambulatory Visit: Payer: Self-pay

## 2013-11-15 NOTE — Telephone Encounter (Signed)
Pt left /vm requesting rx methadone. Call when ready for pick up.

## 2013-11-16 MED ORDER — METHADONE HCL 10 MG PO TABS: ORAL_TABLET | ORAL | Status: DC

## 2013-11-16 NOTE — Telephone Encounter (Signed)
Spoke with patient and advised rx ready for pick-up and it will be at the front desk.  

## 2013-11-29 ENCOUNTER — Other Ambulatory Visit: Payer: Self-pay | Admitting: *Deleted

## 2013-11-29 MED ORDER — GLIMEPIRIDE 2 MG PO TABS: 2.0000 mg | ORAL_TABLET | Freq: Every day | ORAL | Status: DC

## 2013-11-29 MED ORDER — TORSEMIDE 20 MG PO TABS: 40.0000 mg | ORAL_TABLET | Freq: Two times a day (BID) | ORAL | Status: DC

## 2013-11-29 MED ORDER — LOVASTATIN 40 MG PO TABS: ORAL_TABLET | ORAL | Status: DC

## 2013-11-29 MED ORDER — MIRTAZAPINE 30 MG PO TABS: 15.0000 mg | ORAL_TABLET | Freq: Every day | ORAL | Status: DC

## 2013-11-29 MED ORDER — PANTOPRAZOLE SODIUM 40 MG PO TBEC: 40.0000 mg | DELAYED_RELEASE_TABLET | Freq: Two times a day (BID) | ORAL | Status: DC

## 2013-11-29 MED ORDER — GABAPENTIN 300 MG PO CAPS: 300.0000 mg | ORAL_CAPSULE | Freq: Two times a day (BID) | ORAL | Status: DC

## 2013-11-29 MED ORDER — METOPROLOL TARTRATE 25 MG PO TABS: 25.0000 mg | ORAL_TABLET | Freq: Two times a day (BID) | ORAL | Status: DC

## 2013-11-29 MED ORDER — METFORMIN HCL 1000 MG PO TABS: 500.0000 mg | ORAL_TABLET | Freq: Two times a day (BID) | ORAL | Status: DC

## 2013-11-29 MED ORDER — LISINOPRIL 20 MG PO TABS: 20.0000 mg | ORAL_TABLET | Freq: Every day | ORAL | Status: DC

## 2013-12-07 ENCOUNTER — Other Ambulatory Visit: Payer: Self-pay | Admitting: *Deleted

## 2013-12-07 MED ORDER — GABAPENTIN 300 MG PO CAPS: 300.0000 mg | ORAL_CAPSULE | Freq: Four times a day (QID) | ORAL | Status: DC

## 2013-12-24 ENCOUNTER — Other Ambulatory Visit: Payer: Self-pay

## 2013-12-24 NOTE — Telephone Encounter (Signed)
Pt left v/m requesting rx methadone. Call when ready for pick up.

## 2013-12-27 MED ORDER — METHADONE HCL 10 MG PO TABS: ORAL_TABLET | ORAL | Status: DC

## 2013-12-27 NOTE — Telephone Encounter (Signed)
Left message on machine that rx is ready for pick-up, and it will be at our front desk.  

## 2014-01-07 ENCOUNTER — Other Ambulatory Visit: Payer: Self-pay

## 2014-01-07 MED ORDER — ALPRAZOLAM 0.25 MG PO TABS: 0.2500 mg | ORAL_TABLET | Freq: Every day | ORAL | Status: DC | PRN

## 2014-01-07 NOTE — Telephone Encounter (Signed)
Pt request refill alprazolam to [facility]; pt request refill to be done today.Please advise.

## 2014-01-07 NOTE — Telephone Encounter (Signed)
Rx called in as prescribed

## 2014-01-07 NOTE — Telephone Encounter (Signed)
Px written for call in   

## 2014-01-24 ENCOUNTER — Other Ambulatory Visit: Payer: Self-pay

## 2014-01-24 NOTE — Telephone Encounter (Signed)
Pt left v/m requesting rx for methadone. Call when ready for pick up.

## 2014-01-25 ENCOUNTER — Telehealth: Payer: Self-pay

## 2014-01-25 MED ORDER — METHADONE HCL 10 MG PO TABS: ORAL_TABLET | ORAL | Status: DC

## 2014-01-25 NOTE — Telephone Encounter (Signed)
Spoke with patient and advised rx ready for pick-up and it will be at the front desk.  

## 2014-01-25 NOTE — Telephone Encounter (Signed)
Pt left v/m;[facility] sent pt a letter that cannot process gabapentin refill until gets clarification from PCP. Please advise.

## 2014-01-26 NOTE — Telephone Encounter (Signed)
Spoke with [facility] and they will send rx out today Spoke with patient and advised results

## 2014-02-28 ENCOUNTER — Other Ambulatory Visit: Payer: Self-pay

## 2014-02-28 MED ORDER — METHADONE HCL 10 MG PO TABS: ORAL_TABLET | ORAL | Status: DC

## 2014-02-28 NOTE — Telephone Encounter (Signed)
Pt left v/m requesting rx methadone for back pain. Call when ready for pick up.

## 2014-02-28 NOTE — Telephone Encounter (Signed)
Left message on machine that rx is ready for pick-up, and it will be at our front desk.  

## 2014-03-15 ENCOUNTER — Encounter: Payer: Self-pay | Admitting: *Deleted

## 2014-03-15 ENCOUNTER — Ambulatory Visit (INDEPENDENT_AMBULATORY_CARE_PROVIDER_SITE_OTHER): Payer: Commercial Managed Care - HMO | Admitting: Internal Medicine

## 2014-03-15 ENCOUNTER — Encounter: Payer: Self-pay | Admitting: Internal Medicine

## 2014-03-15 VITALS — BP 128/70 | HR 74 | Temp 98.6°F | Wt 133.0 lb

## 2014-03-15 DIAGNOSIS — E1151 Type 2 diabetes mellitus with diabetic peripheral angiopathy without gangrene: Secondary | ICD-10-CM | POA: Insufficient documentation

## 2014-03-15 DIAGNOSIS — E1165 Type 2 diabetes mellitus with hyperglycemia: Secondary | ICD-10-CM

## 2014-03-15 DIAGNOSIS — M549 Dorsalgia, unspecified: Secondary | ICD-10-CM

## 2014-03-15 DIAGNOSIS — E1142 Type 2 diabetes mellitus with diabetic polyneuropathy: Secondary | ICD-10-CM

## 2014-03-15 DIAGNOSIS — F39 Unspecified mood [affective] disorder: Secondary | ICD-10-CM

## 2014-03-15 DIAGNOSIS — IMO0002 Reserved for concepts with insufficient information to code with codable children: Secondary | ICD-10-CM | POA: Insufficient documentation

## 2014-03-15 DIAGNOSIS — E1149 Type 2 diabetes mellitus with other diabetic neurological complication: Secondary | ICD-10-CM

## 2014-03-15 DIAGNOSIS — I739 Peripheral vascular disease, unspecified: Secondary | ICD-10-CM

## 2014-03-15 DIAGNOSIS — I2510 Atherosclerotic heart disease of native coronary artery without angina pectoris: Secondary | ICD-10-CM

## 2014-03-15 DIAGNOSIS — E1159 Type 2 diabetes mellitus with other circulatory complications: Secondary | ICD-10-CM

## 2014-03-15 LAB — HEMOGLOBIN A1C: Hgb A1c MFr Bld: 6.9 % — ABNORMAL HIGH (ref 4.6–6.5)

## 2014-03-15 MED ORDER — GLIMEPIRIDE 1 MG PO TABS: 1.0000 mg | ORAL_TABLET | Freq: Every day | ORAL | Status: DC

## 2014-03-15 NOTE — Patient Instructions (Signed)
Please decrease the glimeride to 74m daily. You can cut the 256mpills in half until they run out.

## 2014-03-15 NOTE — Progress Notes (Signed)
Pre visit review using our clinic review tool, if applicable. No additional management support is needed unless otherwise documented below in the visit note.

## 2014-03-15 NOTE — Assessment & Plan Note (Signed)
No changes needed

## 2014-03-15 NOTE — Assessment & Plan Note (Signed)
Has been quiet On appropriate meds

## 2014-03-15 NOTE — Assessment & Plan Note (Signed)
Sugars are low and she is symptomatic Will decrease glimepiride---may need to stop

## 2014-03-15 NOTE — Assessment & Plan Note (Signed)
No foot or leg pain Hopefully developing collateral circulation

## 2014-03-15 NOTE — Assessment & Plan Note (Signed)
Mostly dysthymia but elements of anxiety as well Doing okay on current regimen

## 2014-03-15 NOTE — Progress Notes (Signed)
Subjective:    Patient ID: Lindsay Harmon, female    DOB: 03-11-1956, 58 y.o.   MRN: [id number]  HPI Here for follow up Reviewed sugar log---frequent sugars under 70 with some hypoglycemic reactions Highest 132 and rarely over 100 fasting  Still satisfied with the methadone Able to do all housework--- doesn't do the rigorous stuff Has just been mulching her beds  No chest pain No SOB No leg pain with exertion No dizziness or syncope Gabapentin seems to control her foot pain  Current Outpatient Prescriptions on File Prior to Visit  Medication Sig Dispense Refill  . ALPRAZolam (XANAX) 0.25 MG tablet Take 1 tablet (0.25 mg total) by mouth daily as needed.  90 tablet  0  . aspirin 325 MG tablet Take 325 mg by mouth daily.        Marland Kitchen gabapentin (NEURONTIN) 300 MG capsule Take 1 capsule (300 mg total) by mouth 4 (four) times daily.  360 capsule  3  . lisinopril (PRINIVIL,ZESTRIL) 20 MG tablet Take 1 tablet (20 mg total) by mouth daily.  90 tablet  3  . loratadine (CLARITIN) 10 MG tablet Take 10 mg by mouth 2 (two) times daily.       Marland Kitchen lovastatin (MEVACOR) 40 MG tablet Take 2 tablets by mouth daily  180 tablet  3  . metFORMIN (GLUCOPHAGE) 1000 MG tablet Take 0.5 tablets (500 mg total) by mouth 2 (two) times daily with a meal.  90 tablet  3  . methadone (DOLOPHINE) 10 MG tablet Take 2 tablets four times a day.  240 tablet  0  . metoprolol tartrate (LOPRESSOR) 25 MG tablet Take 1 tablet (25 mg total) by mouth 2 (two) times daily.  180 tablet  3  . mirtazapine (REMERON) 30 MG tablet Take 0.5-1 tablets (15-30 mg total) by mouth at bedtime.  90 tablet  3  . nitroGLYCERIN (NITROSTAT) 0.4 MG SL tablet Place 1 tablet (0.4 mg total) under the tongue every 5 (five) minutes as needed.  60 tablet  1  . pantoprazole (PROTONIX) 40 MG tablet Take 1 tablet (40 mg total) by mouth 2 (two) times daily.  180 tablet  3  . torsemide (DEMADEX) 20 MG tablet Take 2 tablets (40 mg total) by mouth 2 (two) times  daily.  360 tablet  1   No current facility-administered medications on file prior to visit.    No Known Allergies  Past Medical History  Diagnosis Date  . Depression   . GERD (gastroesophageal reflux disease)   . Hyperlipidemia   . Hypertension   . PVD (peripheral vascular disease)   . CAD (coronary artery disease)   . Familial hematuria   . NIDDM (non-insulin dependent diabetes mellitus)     with neuropathy  . IBS (irritable bowel syndrome)   . Esophageal stricture   . Obesity, unspecified   . Nephrolithiasis   . Allergy   . Arthritis     Past Surgical History  Procedure Laterality Date  . Abdominal hysterectomy    . Cesarean section    . Carpal tunnel release      Family History  Problem Relation Age of Onset  . Diabetes Mother     History   Social History  . Marital Status: Legally Separated    Spouse Name: N/A    Number of Children: 1  . Years of Education: N/A   Occupational History  . 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  Smoking status: Former Research scientist (life sciences)  . Smokeless tobacco: Never Used  . Alcohol Use: No     Comment: heavy in the past  . Drug Use: Not on file  . Sexual Activity: Not on file   Other Topics Concern  . Not on file   Social History Narrative   No living will   Son [name] should make decisions for her if she is unable.    Would accept resuscitation attempts but no prolonged ventilation   Not sure about tube feeds but probably wouldn't want them if cognitively unaware   Review of Systems Easy bruising Sleeps okay  Appetite is okay but lost another 2#. Actually is eating more lately     Objective:   Physical Exam  Constitutional: She appears well-developed and well-nourished. No distress.  Neck: Normal range of motion. Neck supple. No thyromegaly present.  Cardiovascular: Normal rate, regular rhythm and normal heart sounds.  Exam reveals no gallop.   No murmur heard. Faint pedal pulses--warm  today  Pulmonary/Chest: Effort normal and breath sounds normal. No respiratory distress. She has no wheezes. She has no rales.  Abdominal: Soft. There is no tenderness.  Musculoskeletal: She exhibits no edema and no tenderness.  Lymphadenopathy:    She has no cervical adenopathy.  Skin:  No foot lesions  Psychiatric: She has a normal mood and affect. Her behavior is normal.          Assessment & Plan:

## 2014-03-15 NOTE — Assessment & Plan Note (Signed)
Adequate control with the gabapentin

## 2014-03-15 NOTE — Assessment & Plan Note (Signed)
Okay on the stable methadone dose

## 2014-03-30 ENCOUNTER — Other Ambulatory Visit: Payer: Self-pay

## 2014-03-30 MED ORDER — METHADONE HCL 10 MG PO TABS: ORAL_TABLET | ORAL | Status: DC

## 2014-03-30 NOTE — Telephone Encounter (Signed)
Spoke with patient and advised rx ready for pick-up and it will be at the front desk.  

## 2014-03-30 NOTE — Telephone Encounter (Signed)
Pt left v/m requesting rx methadone. Call when ready for pick up.

## 2014-04-01 ENCOUNTER — Other Ambulatory Visit: Payer: Self-pay | Admitting: *Deleted

## 2014-04-01 MED ORDER — ALPRAZOLAM 0.25 MG PO TABS: 0.2500 mg | ORAL_TABLET | Freq: Every day | ORAL | Status: DC | PRN

## 2014-04-01 NOTE — Telephone Encounter (Signed)
#  90x0 last filled 01/07/14, last ov was f/u on 03/15/14.

## 2014-04-01 NOTE — Telephone Encounter (Signed)
Okay #90 x 0 

## 2014-04-01 NOTE — Telephone Encounter (Signed)
rx called into pharmacy

## 2014-04-06 ENCOUNTER — Encounter: Payer: Self-pay | Admitting: Internal Medicine

## 2014-04-13 ENCOUNTER — Other Ambulatory Visit: Payer: Self-pay | Admitting: Internal Medicine

## 2014-04-27 ENCOUNTER — Other Ambulatory Visit: Payer: Self-pay

## 2014-04-27 MED ORDER — METHADONE HCL 10 MG PO TABS: ORAL_TABLET | ORAL | Status: DC

## 2014-04-27 NOTE — Telephone Encounter (Signed)
Spoke with patient and advised rx ready for pick-up and it will be at the front desk.  

## 2014-04-27 NOTE — Telephone Encounter (Signed)
Pt left v/m requesting rx methadone. Call when ready for pick up.

## 2014-05-27 ENCOUNTER — Other Ambulatory Visit: Payer: Self-pay

## 2014-05-27 NOTE — Telephone Encounter (Signed)
Pt left v/m requesting rx methadone; call when ready for pick up. Dr Silvio Pate out of office until 06/01/14.

## 2014-05-30 MED ORDER — METHADONE HCL 10 MG PO TABS: ORAL_TABLET | ORAL | Status: DC

## 2014-05-30 NOTE — Telephone Encounter (Signed)
RX printed and signed and given to [facility]

## 2014-05-30 NOTE — Telephone Encounter (Signed)
Spoke with patient and advised rx ready for pick-up and it will be at the front desk.  

## 2014-06-27 ENCOUNTER — Other Ambulatory Visit: Payer: Self-pay | Admitting: *Deleted

## 2014-06-27 MED ORDER — METHADONE HCL 10 MG PO TABS: ORAL_TABLET | ORAL | Status: DC

## 2014-06-27 NOTE — Telephone Encounter (Signed)
Patient left a voice mail requesting a refill on her Methadone. Please call when ready for pickup. Last refill 05/30/14 #240, last office visit 03/15/14.

## 2014-06-28 NOTE — Telephone Encounter (Signed)
Left message on machine that rx is ready for pick-up, and it will be at our front desk.

## 2014-07-01 ENCOUNTER — Other Ambulatory Visit: Payer: Self-pay | Admitting: Internal Medicine

## 2014-07-01 NOTE — Telephone Encounter (Signed)
Last office visit 03/15/2014.  Last refilled 04/01/2014 for #90 with no refills.  Dr. Silvio Pate out of the office.  Ok to refill?

## 2014-07-04 NOTE — Telephone Encounter (Signed)
Please call in.  Future refills per PCP.  Thanks.

## 2014-07-05 NOTE — Telephone Encounter (Signed)
Called to [employer].

## 2014-08-01 ENCOUNTER — Other Ambulatory Visit: Payer: Self-pay

## 2014-08-01 MED ORDER — METHADONE HCL 10 MG PO TABS: ORAL_TABLET | ORAL | Status: DC

## 2014-08-01 NOTE — Telephone Encounter (Signed)
Pt left v/m requesting rx methadone. Call when ready for pick up.

## 2014-08-02 NOTE — Telephone Encounter (Signed)
Patient notified Rx ready for pick up.

## 2014-08-05 ENCOUNTER — Ambulatory Visit: Payer: Commercial Managed Care - HMO

## 2014-08-08 ENCOUNTER — Ambulatory Visit: Payer: Commercial Managed Care - HMO

## 2014-08-09 ENCOUNTER — Ambulatory Visit (INDEPENDENT_AMBULATORY_CARE_PROVIDER_SITE_OTHER): Payer: Commercial Managed Care - HMO

## 2014-08-09 DIAGNOSIS — Z23 Encounter for immunization: Secondary | ICD-10-CM

## 2014-08-29 ENCOUNTER — Other Ambulatory Visit: Payer: Self-pay | Admitting: Internal Medicine

## 2014-08-30 ENCOUNTER — Other Ambulatory Visit: Payer: Self-pay

## 2014-08-30 MED ORDER — METHADONE HCL 10 MG PO TABS: ORAL_TABLET | ORAL | Status: DC

## 2014-08-30 NOTE — Telephone Encounter (Signed)
Left message on machine that rx is ready for pick-up, and it will be at our front desk.  

## 2014-08-30 NOTE — Telephone Encounter (Signed)
Pt left v/m requesting rx methadone. Call when ready for pick up.

## 2014-09-07 ENCOUNTER — Other Ambulatory Visit: Payer: Self-pay | Admitting: *Deleted

## 2014-09-07 MED ORDER — GLUCOSE BLOOD VI STRP: ORAL_STRIP | Status: DC

## 2014-09-07 MED ORDER — ACCU-CHEK FASTCLIX LANCETS MISC: Status: DC

## 2014-09-07 MED ORDER — ACCU-CHEK AVIVA PLUS W/DEVICE KIT: PACK | Status: DC

## 2014-09-07 NOTE — Telephone Encounter (Signed)
Per fax from [facility] pt needs meter, strips and lancets to bill medicare, they requested Accu-chek [employer]. rx sent to pharmacy by e-script

## 2014-09-13 ENCOUNTER — Other Ambulatory Visit: Payer: Self-pay | Admitting: Internal Medicine

## 2014-09-22 ENCOUNTER — Other Ambulatory Visit: Payer: Self-pay | Admitting: Internal Medicine

## 2014-09-23 ENCOUNTER — Encounter: Payer: Commercial Managed Care - HMO | Admitting: Internal Medicine

## 2014-09-27 ENCOUNTER — Other Ambulatory Visit: Payer: Self-pay | Admitting: Family Medicine

## 2014-09-27 NOTE — Telephone Encounter (Signed)
Approved: #90 x 0 

## 2014-09-27 NOTE — Telephone Encounter (Signed)
07/04/14

## 2014-09-27 NOTE — Telephone Encounter (Signed)
rx called into pharmacy

## 2014-09-28 ENCOUNTER — Other Ambulatory Visit: Payer: Self-pay

## 2014-09-28 NOTE — Telephone Encounter (Signed)
Pt left v/m requesting rx methadone. Call when ready for pick up. Pt last seen 03/15/14.

## 2014-09-29 MED ORDER — METHADONE HCL 10 MG PO TABS: ORAL_TABLET | ORAL | Status: DC

## 2014-09-29 NOTE — Telephone Encounter (Signed)
Left message on machine that rx is ready for pick-up, and it will be at our front desk.  

## 2014-11-03 ENCOUNTER — Other Ambulatory Visit: Payer: Self-pay

## 2014-11-03 MED ORDER — METHADONE HCL 10 MG PO TABS: ORAL_TABLET | ORAL | Status: DC

## 2014-11-03 NOTE — Telephone Encounter (Signed)
Spoke with patient and advised rx ready for pick-up and it will be at the front desk.

## 2014-11-03 NOTE — Telephone Encounter (Signed)
Pt left v/m requesting rx for methadone. Call when ready for pick up. Pt last seen 03/15/14. Pt scheduled CPX 11/08/2014.

## 2014-11-07 ENCOUNTER — Other Ambulatory Visit: Payer: Self-pay | Admitting: Internal Medicine

## 2014-11-08 ENCOUNTER — Ambulatory Visit (INDEPENDENT_AMBULATORY_CARE_PROVIDER_SITE_OTHER): Payer: Commercial Managed Care - HMO | Admitting: Internal Medicine

## 2014-11-08 ENCOUNTER — Encounter: Payer: Self-pay | Admitting: Internal Medicine

## 2014-11-08 VITALS — BP 142/76 | HR 77 | Temp 97.7°F | Ht 60.0 in | Wt 141.0 lb

## 2014-11-08 DIAGNOSIS — I10 Essential (primary) hypertension: Secondary | ICD-10-CM

## 2014-11-08 DIAGNOSIS — E785 Hyperlipidemia, unspecified: Secondary | ICD-10-CM

## 2014-11-08 DIAGNOSIS — I739 Peripheral vascular disease, unspecified: Secondary | ICD-10-CM

## 2014-11-08 DIAGNOSIS — E1149 Type 2 diabetes mellitus with other diabetic neurological complication: Secondary | ICD-10-CM

## 2014-11-08 DIAGNOSIS — G8929 Other chronic pain: Secondary | ICD-10-CM

## 2014-11-08 DIAGNOSIS — M549 Dorsalgia, unspecified: Secondary | ICD-10-CM

## 2014-11-08 DIAGNOSIS — Z23 Encounter for immunization: Secondary | ICD-10-CM

## 2014-11-08 DIAGNOSIS — I2510 Atherosclerotic heart disease of native coronary artery without angina pectoris: Secondary | ICD-10-CM

## 2014-11-08 DIAGNOSIS — Z0000 Encounter for general adult medical examination without abnormal findings: Secondary | ICD-10-CM

## 2014-11-08 DIAGNOSIS — E1142 Type 2 diabetes mellitus with diabetic polyneuropathy: Secondary | ICD-10-CM

## 2014-11-08 DIAGNOSIS — Z7189 Other specified counseling: Secondary | ICD-10-CM

## 2014-11-08 DIAGNOSIS — E1140 Type 2 diabetes mellitus with diabetic neuropathy, unspecified: Secondary | ICD-10-CM

## 2014-11-08 DIAGNOSIS — E1151 Type 2 diabetes mellitus with diabetic peripheral angiopathy without gangrene: Secondary | ICD-10-CM

## 2014-11-08 DIAGNOSIS — E1159 Type 2 diabetes mellitus with other circulatory complications: Secondary | ICD-10-CM

## 2014-11-08 LAB — HEMOGLOBIN A1C: HEMOGLOBIN A1C: 8.1 % — AB (ref 4.6–6.5)

## 2014-11-08 LAB — COMPREHENSIVE METABOLIC PANEL
ALT: 12 U/L (ref 0–35)
AST: 17 U/L (ref 0–37)
Albumin: 3.3 g/dL — ABNORMAL LOW (ref 3.5–5.2)
Alkaline Phosphatase: 119 U/L — ABNORMAL HIGH (ref 39–117)
BUN: 23 mg/dL (ref 6–23)
CALCIUM: 8.4 mg/dL (ref 8.4–10.5)
CHLORIDE: 98 meq/L (ref 96–112)
CO2: 32 meq/L (ref 19–32)
Creatinine, Ser: 1.7 mg/dL — ABNORMAL HIGH (ref 0.4–1.2)
GFR: 33.16 mL/min — ABNORMAL LOW (ref >60.00)
GLUCOSE: 110 mg/dL — AB (ref 70–99)
Potassium: 4.4 mEq/L (ref 3.5–5.1)
Sodium: 136 mEq/L (ref 135–145)
Total Bilirubin: 0.7 mg/dL (ref 0.2–1.2)
Total Protein: 6.8 g/dL (ref 6.0–8.3)

## 2014-11-08 LAB — CBC WITH DIFFERENTIAL/PLATELET
BASOS ABS: 0.1 10*3/uL (ref 0.0–0.1)
Basophils Relative: 0.5 % (ref 0.0–3.0)
EOS ABS: 0.1 10*3/uL (ref 0.0–0.7)
Eosinophils Relative: 0.6 % (ref 0.0–5.0)
HCT: 48.4 % — ABNORMAL HIGH (ref 36.0–46.0)
Hemoglobin: 15 g/dL (ref 12.0–15.0)
Lymphocytes Relative: 20.3 % (ref 12.0–46.0)
Lymphs Abs: 2.3 10*3/uL (ref 0.7–4.0)
MCHC: 31 g/dL (ref 30.0–36.0)
MCV: 81.7 fl (ref 78.0–100.0)
MONOS PCT: 5.2 % (ref 3.0–12.0)
Monocytes Absolute: 0.6 10*3/uL (ref 0.1–1.0)
NEUTROS PCT: 73.4 % (ref 43.0–77.0)
Neutro Abs: 8.2 10*3/uL — ABNORMAL HIGH (ref 1.4–7.7)
Platelets: 273 10*3/uL (ref 150.0–400.0)
RBC: 5.92 Mil/uL — AB (ref 3.87–5.11)
RDW: 18.2 % — AB (ref 11.5–15.5)
WBC: 11.2 10*3/uL — ABNORMAL HIGH (ref 4.0–10.5)

## 2014-11-08 LAB — T4, FREE: Free T4: 1.14 ng/dL (ref 0.60–1.60)

## 2014-11-08 LAB — LIPID PANEL
Cholesterol: 107 mg/dL (ref 0–200)
HDL: 24.5 mg/dL — ABNORMAL LOW (ref >39.00)
LDL CALC: 58 mg/dL (ref 0–99)
NonHDL: 82.5
TRIGLYCERIDES: 124 mg/dL (ref 0.0–149.0)
Total CHOL/HDL Ratio: 4
VLDL: 24.8 mg/dL (ref 0.0–40.0)

## 2014-11-08 LAB — HM DIABETES FOOT EXAM

## 2014-11-08 MED ORDER — ZOSTER VACCINE LIVE 19400 UNT/0.65ML ~~LOC~~ SOLR: 0.6500 mL | Freq: Once | SUBCUTANEOUS | Status: DC

## 2014-11-08 NOTE — Assessment & Plan Note (Signed)
No problems with statin Due for labs

## 2014-11-08 NOTE — Assessment & Plan Note (Signed)
No action due to lack of symptoms Discussed that she needs to start walking regularly

## 2014-11-08 NOTE — Assessment & Plan Note (Signed)
Ongoing but stable on methadone

## 2014-11-08 NOTE — Assessment & Plan Note (Signed)
See social history 

## 2014-11-08 NOTE — Assessment & Plan Note (Signed)
Controlled with the gabapentin

## 2014-11-08 NOTE — Progress Notes (Signed)
Pre visit review using our clinic review tool, if applicable. No additional management support is needed unless otherwise documented below in the visit note.

## 2014-11-08 NOTE — Assessment & Plan Note (Signed)
BP Readings from Last 3 Encounters:  11/08/14 142/76  03/15/14 128/70  09/15/13 150/80   Reasonable control--borderline today No changes in meds

## 2014-11-08 NOTE — Assessment & Plan Note (Addendum)
I have personally reviewed the Medicare Annual Wellness questionnaire and have noted 1. The patient's medical and social history 2. Their use of alcohol, tobacco or illicit drugs 3. Their current medications and supplements 4. The patient's functional ability including ADL's, fall risks, home safety risks and hearing or visual             impairment. 5. Diet and physical activities 6. Evidence for depression or mood disorders  The patients weight, height, BMI and visual acuity have been recorded in the chart I have made referrals, counseling and provided education to the patient based review of the above and I have provided the pt with a written personalized care plan for preventive services.  I have provided you with a copy of your personalized plan for preventive services. Please take the time to review along with your updated medication list.  Rx for zostavax Will give prevnar Mammogram due 12/16----deferred exam after discussion Colonoscopy due 2019 No pap due to hysterectomy Discussed increasing exercise

## 2014-11-08 NOTE — Assessment & Plan Note (Signed)
Due for A1c

## 2014-11-08 NOTE — Assessment & Plan Note (Signed)
Has been quiet On appropriate meds

## 2014-11-08 NOTE — Patient Instructions (Signed)
Please get your eye exam. Your mammogram is not due till December 2016.

## 2014-11-08 NOTE — Addendum Note (Signed)
Addended by: Despina Hidden on: 11/08/2014 12:50 PM   Modules accepted: Orders

## 2014-11-08 NOTE — Assessment & Plan Note (Signed)
Still seems well controlled on lower glimiperide Would stop this if increased hypoglycemia if A1c still good

## 2014-11-08 NOTE — Progress Notes (Signed)
Subjective:    Patient ID: Lindsay Harmon, female    DOB: 08-17-1956, 59 y.o.   MRN: [id number]  HPI Here for initial Medicare wellness visit and follow up of diabetes and other medical conditions Form and advanced directives reviewed No tobacco or alcohol Reviewed other physicians No tobacco or alcohol No exercise ---discussed Still drives and does all instrumental ADLs No falls No significant cognitive problems Vision and hearing seem fine  Checks sugars every other day Has had as low as 49 Most are under 120 and many under 100 Not as many low sugar reactions since glimiperide decreased Overdue for eye exam Pain in feet not bad anymore on the gabapentin No sores or open areas  Satisfied with methadone Variable from day to day but acceptable  Mood has been okay No regular depressed mood Enjoys making card, scrapbooking, etc  No chest pain Rare palpitations--this goes back many years No dizziness or syncope Mild edema--- keeps legs up Breathing is okay  Current Outpatient Prescriptions on File Prior to Visit  Medication Sig Dispense Refill  . ACCU-CHEK FASTCLIX LANCETS MISC Use to check blood sugar once daily dx: E11.49 100 each 3  . ALPRAZolam (XANAX) 0.25 MG tablet TAKE ONE (1) TABLET BY MOUTH EVERY DAY 90 tablet 0  . aspirin 325 MG tablet Take 325 mg by mouth daily.      Marland Kitchen gabapentin (NEURONTIN) 300 MG capsule TAKE 1 CAPSULE FOUR TIMES DAILY 360 capsule 3  . glimepiride (AMARYL) 1 MG tablet Take 1 tablet (1 mg total) by mouth daily with breakfast. 90 tablet 3  . glucose blood (ACCU-CHEK AVIVA PLUS) test strip Use as instructed to check blood sugar once daily dx: E11.49 100 each 3  . lisinopril (PRINIVIL,ZESTRIL) 20 MG tablet Take 1 tablet (20 mg total) by mouth daily. 90 tablet 3  . loratadine (CLARITIN) 10 MG tablet Take 10 mg by mouth 2 (two) times daily.     Marland Kitchen lovastatin (MEVACOR) 40 MG tablet TAKE 2 TABLETS EVERY DAY 180 tablet 3  . metFORMIN (GLUCOPHAGE) 1000  MG tablet TAKE 1/2 TABLET TWICE DAILY WITH A MEAL 90 tablet 3  . methadone (DOLOPHINE) 10 MG tablet Take 2 tablets four times a day. 240 tablet 0  . metoprolol tartrate (LOPRESSOR) 25 MG tablet TAKE 1 TABLET TWICE DAILY 180 tablet 3  . mirtazapine (REMERON) 30 MG tablet TAKE 1/2 TO 1 TABLET AT BEDTIME 90 tablet 3  . nitroGLYCERIN (NITROSTAT) 0.4 MG SL tablet Place 1 tablet (0.4 mg total) under the tongue every 5 (five) minutes as needed. 60 tablet 1  . pantoprazole (PROTONIX) 40 MG tablet Take 1 tablet (40 mg total) by mouth 2 (two) times daily. 180 tablet 3  . torsemide (DEMADEX) 20 MG tablet TAKE 2 TABLETS TWICE DAILY 360 tablet 1   No current facility-administered medications on file prior to visit.    No Known Allergies  Past Medical History  Diagnosis Date  . Depression   . GERD (gastroesophageal reflux disease)   . Hyperlipidemia   . Hypertension   . PVD (peripheral vascular disease)   . CAD (coronary artery disease)   . Familial hematuria   . NIDDM (non-insulin dependent diabetes mellitus)     with neuropathy  . IBS (irritable bowel syndrome)   . Esophageal stricture   . Obesity, unspecified   . Nephrolithiasis   . Allergy   . Arthritis     Past Surgical History  Procedure Laterality Date  . Abdominal hysterectomy    .  Cesarean section    . Carpal tunnel release      Family History  Problem Relation Age of Onset  . Diabetes Mother     History   Social History  . Marital Status: Legally Separated    Spouse Name: N/A    Number of Children: 1  . Years of Education: N/A   Occupational History  . 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 Smokeless tobacco: Never Used  . Alcohol Use: No     Comment: heavy in the past  . Drug Use: Not on file  . Sexual Activity: Not on file   Other Topics Concern  . Not on file   Social History Narrative   No living will   Son [name] should make decisions  for her if she is unable.    Would accept resuscitation attempts but no prolonged ventilation   Not sure about tube feeds but probably wouldn't want them if cognitively unaware   Review of Systems Never a great sleeper--but has been okay Bowels have been fine Voids okay--- mild stress incontinence    Objective:   Physical Exam  Constitutional: She is oriented to person, place, and time. She appears well-developed and well-nourished. No distress.  HENT:  Mouth/Throat: Oropharynx is clear and moist. No oropharyngeal exudate.  Upper plate Few lower feet  Neck: Normal range of motion. Neck supple. No thyromegaly present.  Cardiovascular: Normal rate, regular rhythm and normal heart sounds.  Exam reveals no gallop.   No murmur heard. Pedal pulses not palpable  Pulmonary/Chest: Effort normal and breath sounds normal. No respiratory distress. She has no wheezes. She has no rales.  Abdominal: Soft. There is no tenderness.  Musculoskeletal: She exhibits no edema or tenderness.  Dependent rubor in feet and they are cold No ulcers  Lymphadenopathy:    She has no cervical adenopathy.  Neurological: She is alert and oriented to person, place, and time.  President-- "Obama, ?" [id number] Recall 3/3  Decreased fine touch sensation in feet  Skin: No rash noted. No erythema.  Psychiatric: She has a normal mood and affect. Her behavior is normal.          Assessment & Plan:

## 2014-11-09 ENCOUNTER — Encounter: Payer: Self-pay | Admitting: Internal Medicine

## 2014-11-09 ENCOUNTER — Telehealth: Payer: Self-pay | Admitting: Internal Medicine

## 2014-11-09 ENCOUNTER — Encounter: Payer: Self-pay | Admitting: *Deleted

## 2014-11-09 NOTE — Telephone Encounter (Signed)
emmi mailed  °

## 2014-11-30 ENCOUNTER — Other Ambulatory Visit: Payer: Self-pay

## 2014-11-30 MED ORDER — METHADONE HCL 10 MG PO TABS: ORAL_TABLET | ORAL | Status: DC

## 2014-11-30 NOTE — Telephone Encounter (Signed)
Pt left v/m requesting rx methadone for back pain; call when ready for pick up. Pt last seen 11/08/14.

## 2014-11-30 NOTE — Telephone Encounter (Signed)
Patient notified by telephone that script is up front ready for pickup.

## 2014-12-01 ENCOUNTER — Ambulatory Visit (INDEPENDENT_AMBULATORY_CARE_PROVIDER_SITE_OTHER): Payer: Commercial Managed Care - HMO | Admitting: Internal Medicine

## 2014-12-01 ENCOUNTER — Encounter: Payer: Self-pay | Admitting: Internal Medicine

## 2014-12-01 VITALS — BP 132/68 | HR 74 | Temp 97.8°F | Ht 60.0 in | Wt 141.2 lb

## 2014-12-01 DIAGNOSIS — N183 Chronic kidney disease, stage 3 unspecified: Secondary | ICD-10-CM

## 2014-12-01 DIAGNOSIS — N184 Chronic kidney disease, stage 4 (severe): Secondary | ICD-10-CM | POA: Insufficient documentation

## 2014-12-01 DIAGNOSIS — R140 Abdominal distension (gaseous): Secondary | ICD-10-CM | POA: Insufficient documentation

## 2014-12-01 NOTE — Assessment & Plan Note (Signed)
Recent increase in creatinine ?part of current picture?? Will recheck

## 2014-12-01 NOTE — Progress Notes (Signed)
Pre visit review using our clinic review tool, if applicable. No additional management support is needed unless otherwise documented below in the visit note.

## 2014-12-01 NOTE — Progress Notes (Signed)
Subjective:    Patient ID: Lindsay Harmon, female    DOB: [birth year], 59 y.o.   MRN: [id number]  HPI Here because "I'm all swelled up" Legs, belly and even some in arms Legs feel "like they're lead weights"  Came quickly though she had small feeling in legs even last month No weight gain--but belly is tight  No change in diet Tries to limit salt  Gets bloating after eating---may feel some SOB after eating due to this No fever  Current Outpatient Prescriptions on File Prior to Visit  Medication Sig Dispense Refill  . ACCU-CHEK FASTCLIX LANCETS MISC Use to check blood sugar once daily dx: E11.49 100 each 3  . ALPRAZolam (XANAX) 0.25 MG tablet TAKE ONE (1) TABLET BY MOUTH EVERY DAY 90 tablet 0  . aspirin 325 MG tablet Take 325 mg by mouth daily.      Marland Kitchen gabapentin (NEURONTIN) 300 MG capsule TAKE 1 CAPSULE FOUR TIMES DAILY 360 capsule 3  . glimepiride (AMARYL) 1 MG tablet Take 1 tablet (1 mg total) by mouth daily with breakfast. 90 tablet 3  . glucose blood (ACCU-CHEK AVIVA PLUS) test strip Use as instructed to check blood sugar once daily dx: E11.49 100 each 3  . lisinopril (PRINIVIL,ZESTRIL) 20 MG tablet Take 1 tablet (20 mg total) by mouth daily. 90 tablet 3  . loratadine (CLARITIN) 10 MG tablet Take 10 mg by mouth 2 (two) times daily.     Marland Kitchen lovastatin (MEVACOR) 40 MG tablet TAKE 2 TABLETS EVERY DAY 180 tablet 3  . metFORMIN (GLUCOPHAGE) 1000 MG tablet TAKE 1/2 TABLET TWICE DAILY WITH A MEAL 90 tablet 3  . methadone (DOLOPHINE) 10 MG tablet Take 2 tablets four times a day. 240 tablet 0  . metoprolol tartrate (LOPRESSOR) 25 MG tablet TAKE 1 TABLET TWICE DAILY 180 tablet 3  . mirtazapine (REMERON) 30 MG tablet TAKE 1/2 TO 1 TABLET AT BEDTIME 90 tablet 3  . nitroGLYCERIN (NITROSTAT) 0.4 MG SL tablet Place 1 tablet (0.4 mg total) under the tongue every 5 (five) minutes as needed. 60 tablet 1  . pantoprazole (PROTONIX) 40 MG tablet Take 1 tablet (40 mg total) by mouth 2 (two) times  daily. 180 tablet 3  . torsemide (DEMADEX) 20 MG tablet TAKE 2 TABLETS TWICE DAILY 360 tablet 1  . zoster vaccine live, PF, (ZOSTAVAX) 66599 UNT/0.65ML injection Inject 19,400 Units into the skin once. 1 each 0   No current facility-administered medications on file prior to visit.    No Known Allergies  Past Medical History  Diagnosis Date  . Depression   . GERD (gastroesophageal reflux disease)   . Hyperlipidemia   . Hypertension   . PVD (peripheral vascular disease)   . CAD (coronary artery disease)   . Familial hematuria   . NIDDM (non-insulin dependent diabetes mellitus)     with neuropathy  . IBS (irritable bowel syndrome)   . Esophageal stricture   . Obesity, unspecified   . Nephrolithiasis   . Allergy   . Arthritis     Past Surgical History  Procedure Laterality Date  . Abdominal hysterectomy    . Cesarean section    . Carpal tunnel release      Family History  Problem Relation Age of Onset  . Diabetes Mother     History   Social History  . Marital Status: Legally Separated    Spouse Name: N/A    Number of Children: 1  . Years of Education: N/A  Occupational History  . 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 Smokeless tobacco: Never Used  . Alcohol Use: No     Comment: heavy in the past  . Drug Use: Not on file  . Sexual Activity: Not on file   Other Topics Concern  . Not on file   Social History Narrative   No living will   Son [name] should make decisions for her if she is unable.    Would accept resuscitation attempts but no prolonged ventilation   Not sure about tube feeds but probably wouldn't want them if cognitively unaware   Review of Systems Doesn't feel like the diuretic is working right Bowels are mildly affected---some constipation    Objective:   Physical Exam  Constitutional: She appears well-developed and well-nourished. No distress.  Neck: Normal range of  motion. Neck supple. No thyromegaly present.  Cardiovascular: Normal rate, regular rhythm and normal heart sounds.  Exam reveals no gallop.   No murmur heard. Pulmonary/Chest: Effort normal. No respiratory distress. She has no wheezes.  Faint bibasilar crackles  Abdominal: Soft. She exhibits distension. She exhibits no mass. There is no tenderness. There is no rebound and no guarding.  ?fluid wave and some dullness at gutters  Musculoskeletal:  2+ tense edema in feet and calves  Lymphadenopathy:    She has no cervical adenopathy.          Assessment & Plan:

## 2014-12-01 NOTE — Assessment & Plan Note (Addendum)
Clinical picture is very suspicious for cirrhosis and ascites Not clear what the etiology would be No IV drug use Heavy drinker for short time--but ~30 years ago No blood transfusions  Will check hepatitis and HIV If no anatomical finding--but cirrhosis, will leave rest of w/u to GI Will check ultrasound as starting point for now

## 2014-12-02 ENCOUNTER — Telehealth: Payer: Self-pay | Admitting: Radiology

## 2014-12-02 ENCOUNTER — Ambulatory Visit: Payer: Self-pay | Admitting: Internal Medicine

## 2014-12-02 LAB — CBC WITH DIFFERENTIAL/PLATELET
BASOS PCT: 0.4 % (ref 0.0–3.0)
Basophils Absolute: 0 10*3/uL (ref 0.0–0.1)
EOS ABS: 0.1 10*3/uL (ref 0.0–0.7)
Eosinophils Relative: 0.7 % (ref 0.0–5.0)
HCT: 46.7 % — ABNORMAL HIGH (ref 36.0–46.0)
HEMOGLOBIN: 14.9 g/dL (ref 12.0–15.0)
LYMPHS ABS: 2.6 10*3/uL (ref 0.7–4.0)
Lymphocytes Relative: 23.7 % (ref 12.0–46.0)
MCHC: 32 g/dL (ref 30.0–36.0)
MCV: 77.7 fl — ABNORMAL LOW (ref 78.0–100.0)
MONO ABS: 0.8 10*3/uL (ref 0.1–1.0)
Monocytes Relative: 6.9 % (ref 3.0–12.0)
Neutro Abs: 7.6 10*3/uL (ref 1.4–7.7)
Neutrophils Relative %: 68.3 % (ref 43.0–77.0)
PLATELETS: 277 10*3/uL (ref 150.0–400.0)
RBC: 6.01 Mil/uL — AB (ref 3.87–5.11)
RDW: 18.5 % — ABNORMAL HIGH (ref 11.5–15.5)
WBC: 11.1 10*3/uL — ABNORMAL HIGH (ref 4.0–10.5)

## 2014-12-02 LAB — COMPREHENSIVE METABOLIC PANEL
ALT: 40 U/L — ABNORMAL HIGH (ref 0–35)
AST: 24 U/L (ref 0–37)
Albumin: 3.4 g/dL — ABNORMAL LOW (ref 3.5–5.2)
Alkaline Phosphatase: 117 U/L (ref 39–117)
BUN: 40 mg/dL — ABNORMAL HIGH (ref 6–23)
CALCIUM: 8.1 mg/dL — AB (ref 8.4–10.5)
CHLORIDE: 97 meq/L (ref 96–112)
CO2: 32 meq/L (ref 19–32)
Creatinine, Ser: 2.26 mg/dL — ABNORMAL HIGH (ref 0.40–1.20)
GFR: 23.38 mL/min — ABNORMAL LOW (ref >60.00)
Glucose, Bld: 40 mg/dL — CL (ref 70–99)
Potassium: 3.9 mEq/L (ref 3.5–5.1)
Sodium: 139 mEq/L (ref 135–145)
Total Bilirubin: 0.8 mg/dL (ref 0.2–1.2)
Total Protein: 6.6 g/dL (ref 6.0–8.3)

## 2014-12-02 LAB — SEDIMENTATION RATE: Sed Rate: 1 mm/hr (ref 0–22)

## 2014-12-02 NOTE — Telephone Encounter (Signed)
[facility] called a critical result, Glucose - 40. Results given to Dr Silvio Pate by hand written note

## 2014-12-03 ENCOUNTER — Emergency Department (HOSPITAL_COMMUNITY): Payer: Commercial Managed Care - HMO

## 2014-12-03 ENCOUNTER — Encounter (HOSPITAL_COMMUNITY): Payer: Self-pay | Admitting: Emergency Medicine

## 2014-12-03 ENCOUNTER — Inpatient Hospital Stay (HOSPITAL_COMMUNITY)
Admission: EM | Admit: 2014-12-03 | Discharge: 2014-12-07 | DRG: 286 | Disposition: A | Discharge status: Home / Self Care | Payer: Commercial Managed Care - HMO | Attending: Internal Medicine | Admitting: Internal Medicine

## 2014-12-03 DIAGNOSIS — N184 Chronic kidney disease, stage 4 (severe): Secondary | ICD-10-CM | POA: Diagnosis present

## 2014-12-03 DIAGNOSIS — I10 Essential (primary) hypertension: Secondary | ICD-10-CM | POA: Diagnosis present

## 2014-12-03 DIAGNOSIS — IMO0002 Reserved for concepts with insufficient information to code with codable children: Secondary | ICD-10-CM | POA: Diagnosis present

## 2014-12-03 DIAGNOSIS — E1151 Type 2 diabetes mellitus with diabetic peripheral angiopathy without gangrene: Secondary | ICD-10-CM | POA: Diagnosis present

## 2014-12-03 DIAGNOSIS — Z6827 Body mass index (BMI) 27.0-27.9, adult: Secondary | ICD-10-CM | POA: Diagnosis not present

## 2014-12-03 DIAGNOSIS — M1990 Unspecified osteoarthritis, unspecified site: Secondary | ICD-10-CM | POA: Diagnosis present

## 2014-12-03 DIAGNOSIS — Z87891 Personal history of nicotine dependence: Secondary | ICD-10-CM

## 2014-12-03 DIAGNOSIS — Z90710 Acquired absence of both cervix and uterus: Secondary | ICD-10-CM | POA: Diagnosis not present

## 2014-12-03 DIAGNOSIS — E1142 Type 2 diabetes mellitus with diabetic polyneuropathy: Secondary | ICD-10-CM | POA: Diagnosis present

## 2014-12-03 DIAGNOSIS — K219 Gastro-esophageal reflux disease without esophagitis: Secondary | ICD-10-CM | POA: Diagnosis present

## 2014-12-03 DIAGNOSIS — Z7982 Long term (current) use of aspirin: Secondary | ICD-10-CM

## 2014-12-03 DIAGNOSIS — E11649 Type 2 diabetes mellitus with hypoglycemia without coma: Secondary | ICD-10-CM | POA: Diagnosis present

## 2014-12-03 DIAGNOSIS — E785 Hyperlipidemia, unspecified: Secondary | ICD-10-CM | POA: Diagnosis present

## 2014-12-03 DIAGNOSIS — I2510 Atherosclerotic heart disease of native coronary artery without angina pectoris: Secondary | ICD-10-CM | POA: Diagnosis present

## 2014-12-03 DIAGNOSIS — E1149 Type 2 diabetes mellitus with other diabetic neurological complication: Secondary | ICD-10-CM | POA: Diagnosis present

## 2014-12-03 DIAGNOSIS — I248 Other forms of acute ischemic heart disease: Secondary | ICD-10-CM | POA: Diagnosis present

## 2014-12-03 DIAGNOSIS — J9621 Acute and chronic respiratory failure with hypoxia: Secondary | ICD-10-CM | POA: Diagnosis present

## 2014-12-03 DIAGNOSIS — K589 Irritable bowel syndrome without diarrhea: Secondary | ICD-10-CM | POA: Diagnosis present

## 2014-12-03 DIAGNOSIS — I5033 Acute on chronic diastolic (congestive) heart failure: Secondary | ICD-10-CM | POA: Diagnosis present

## 2014-12-03 DIAGNOSIS — I129 Hypertensive chronic kidney disease with stage 1 through stage 4 chronic kidney disease, or unspecified chronic kidney disease: Secondary | ICD-10-CM | POA: Diagnosis present

## 2014-12-03 DIAGNOSIS — I509 Heart failure, unspecified: Secondary | ICD-10-CM

## 2014-12-03 DIAGNOSIS — F329 Major depressive disorder, single episode, unspecified: Secondary | ICD-10-CM | POA: Diagnosis present

## 2014-12-03 DIAGNOSIS — I272 Other secondary pulmonary hypertension: Secondary | ICD-10-CM | POA: Diagnosis present

## 2014-12-03 DIAGNOSIS — N179 Acute kidney failure, unspecified: Secondary | ICD-10-CM | POA: Diagnosis present

## 2014-12-03 DIAGNOSIS — I2781 Cor pulmonale (chronic): Secondary | ICD-10-CM | POA: Diagnosis present

## 2014-12-03 DIAGNOSIS — K761 Chronic passive congestion of liver: Secondary | ICD-10-CM | POA: Diagnosis present

## 2014-12-03 DIAGNOSIS — E1165 Type 2 diabetes mellitus with hyperglycemia: Secondary | ICD-10-CM

## 2014-12-03 DIAGNOSIS — I739 Peripheral vascular disease, unspecified: Secondary | ICD-10-CM | POA: Diagnosis present

## 2014-12-03 DIAGNOSIS — I5032 Chronic diastolic (congestive) heart failure: Secondary | ICD-10-CM | POA: Diagnosis present

## 2014-12-03 DIAGNOSIS — G894 Chronic pain syndrome: Secondary | ICD-10-CM | POA: Diagnosis present

## 2014-12-03 DIAGNOSIS — R0600 Dyspnea, unspecified: Secondary | ICD-10-CM

## 2014-12-03 DIAGNOSIS — R609 Edema, unspecified: Secondary | ICD-10-CM

## 2014-12-03 DIAGNOSIS — J811 Chronic pulmonary edema: Secondary | ICD-10-CM | POA: Diagnosis present

## 2014-12-03 DIAGNOSIS — G4733 Obstructive sleep apnea (adult) (pediatric): Secondary | ICD-10-CM | POA: Diagnosis present

## 2014-12-03 DIAGNOSIS — J449 Chronic obstructive pulmonary disease, unspecified: Secondary | ICD-10-CM | POA: Diagnosis present

## 2014-12-03 DIAGNOSIS — E669 Obesity, unspecified: Secondary | ICD-10-CM | POA: Diagnosis present

## 2014-12-03 DIAGNOSIS — E1140 Type 2 diabetes mellitus with diabetic neuropathy, unspecified: Secondary | ICD-10-CM

## 2014-12-03 DIAGNOSIS — R188 Other ascites: Secondary | ICD-10-CM | POA: Diagnosis present

## 2014-12-03 DIAGNOSIS — E8809 Other disorders of plasma-protein metabolism, not elsewhere classified: Secondary | ICD-10-CM

## 2014-12-03 LAB — CBC WITH DIFFERENTIAL/PLATELET
BASOS ABS: 0.1 10*3/uL (ref 0.0–0.1)
Basophils Relative: 1 % (ref 0–1)
EOS ABS: 0 10*3/uL (ref 0.0–0.7)
Eosinophils Relative: 0 % (ref 0–5)
HEMATOCRIT: 45.7 % (ref 36.0–46.0)
Hemoglobin: 14.6 g/dL (ref 12.0–15.0)
LYMPHS PCT: 24 % (ref 12–46)
Lymphs Abs: 2.4 10*3/uL (ref 0.7–4.0)
MCH: 24.9 pg — ABNORMAL LOW (ref 26.0–34.0)
MCHC: 31.9 g/dL (ref 30.0–36.0)
MCV: 77.9 fL — ABNORMAL LOW (ref 78.0–100.0)
Monocytes Absolute: 0.8 10*3/uL (ref 0.1–1.0)
Monocytes Relative: 9 % (ref 3–12)
NEUTROS ABS: 6.4 10*3/uL (ref 1.7–7.7)
NEUTROS PCT: 66 % (ref 43–77)
Platelets: 254 10*3/uL (ref 150–400)
RBC: 5.87 MIL/uL — ABNORMAL HIGH (ref 3.87–5.11)
RDW: 18.2 % — ABNORMAL HIGH (ref 11.5–15.5)
WBC: 9.7 10*3/uL (ref 4.0–10.5)

## 2014-12-03 LAB — COMPREHENSIVE METABOLIC PANEL
ALT: 51 U/L — ABNORMAL HIGH (ref 0–35)
ANION GAP: 11 (ref 5–15)
AST: 69 U/L — ABNORMAL HIGH (ref 0–37)
Albumin: 2.9 g/dL — ABNORMAL LOW (ref 3.5–5.2)
Alkaline Phosphatase: 115 U/L (ref 39–117)
BUN: 41 mg/dL — ABNORMAL HIGH (ref 6–23)
CALCIUM: 7.1 mg/dL — AB (ref 8.4–10.5)
CO2: 31 mmol/L (ref 19–32)
CREATININE: 2.84 mg/dL — AB (ref 0.50–1.10)
Chloride: 97 mmol/L (ref 96–112)
GFR, EST AFRICAN AMERICAN: 20 mL/min — AB (ref >90)
GFR, EST NON AFRICAN AMERICAN: 17 mL/min — AB (ref >90)
Glucose, Bld: 81 mg/dL (ref 70–99)
Potassium: 4.1 mmol/L (ref 3.5–5.1)
Sodium: 139 mmol/L (ref 135–145)
Total Bilirubin: 0.8 mg/dL (ref 0.3–1.2)
Total Protein: 6.2 g/dL (ref 6.0–8.3)

## 2014-12-03 LAB — ACUTE HEP PANEL AND HEP B SURFACE AB
HCV Ab: NEGATIVE
HEP B S AB: NEGATIVE
Hep A IgM: NONREACTIVE
Hep B C IgM: NONREACTIVE
Hepatitis B Surface Ag: NEGATIVE

## 2014-12-03 LAB — OSMOLALITY: OSMOLALITY: 295 mosm/kg (ref 275–300)

## 2014-12-03 LAB — CBG MONITORING, ED: GLUCOSE-CAPILLARY: 60 mg/dL — AB (ref 70–99)

## 2014-12-03 LAB — HIV ANTIBODY (ROUTINE TESTING W REFLEX): HIV: NONREACTIVE

## 2014-12-03 LAB — TROPONIN I: Troponin I: 0.15 ng/mL — ABNORMAL HIGH (ref <0.031)

## 2014-12-03 LAB — GLUCOSE, CAPILLARY
Glucose-Capillary: 102 mg/dL — ABNORMAL HIGH (ref 70–99)
Glucose-Capillary: 175 mg/dL — ABNORMAL HIGH (ref 70–99)

## 2014-12-03 MED ORDER — NITROGLYCERIN 0.4 MG SL SUBL: 0.4000 mg | SUBLINGUAL_TABLET | SUBLINGUAL | Status: DC | PRN

## 2014-12-03 MED ORDER — ASPIRIN 325 MG PO TABS
325.0000 mg | ORAL_TABLET | Freq: Every day | ORAL | Status: DC
  Administered 2014-12-04 – 2014-12-07 (×3): 325 mg via ORAL
  Filled 2014-12-03 (×4): qty 1

## 2014-12-03 MED ORDER — SODIUM CHLORIDE 0.9 % IJ SOLN: 3.0000 mL | INTRAMUSCULAR | Status: DC | PRN

## 2014-12-03 MED ORDER — ONDANSETRON HCL 4 MG/2ML IJ SOLN: 4.0000 mg | Freq: Four times a day (QID) | INTRAMUSCULAR | Status: DC | PRN

## 2014-12-03 MED ORDER — INSULIN ASPART 100 UNIT/ML ~~LOC~~ SOLN
0.0000 [IU] | Freq: Three times a day (TID) | SUBCUTANEOUS | Status: DC
  Administered 2014-12-04 (×2): 2 [IU] via SUBCUTANEOUS
  Administered 2014-12-06: 5 [IU] via SUBCUTANEOUS
  Administered 2014-12-07: 1 [IU] via SUBCUTANEOUS
  Administered 2014-12-07: 3 [IU] via SUBCUTANEOUS

## 2014-12-03 MED ORDER — SODIUM CHLORIDE 0.9 % IV BOLUS (SEPSIS)
500.0000 mL | Freq: Once | INTRAVENOUS | Status: AC
  Administered 2014-12-03: 500 mL via INTRAVENOUS

## 2014-12-03 MED ORDER — PANTOPRAZOLE SODIUM 40 MG PO TBEC
40.0000 mg | DELAYED_RELEASE_TABLET | Freq: Two times a day (BID) | ORAL | Status: DC
  Administered 2014-12-03 – 2014-12-07 (×8): 40 mg via ORAL
  Filled 2014-12-03 (×8): qty 1

## 2014-12-03 MED ORDER — GABAPENTIN 100 MG PO CAPS
200.0000 mg | ORAL_CAPSULE | Freq: Two times a day (BID) | ORAL | Status: DC
  Administered 2014-12-03 – 2014-12-07 (×7): 200 mg via ORAL
  Filled 2014-12-03 (×9): qty 2

## 2014-12-03 MED ORDER — SODIUM CHLORIDE 0.9 % IJ SOLN
3.0000 mL | Freq: Two times a day (BID) | INTRAMUSCULAR | Status: DC
  Administered 2014-12-03 – 2014-12-04 (×3): 3 mL via INTRAVENOUS

## 2014-12-03 MED ORDER — SODIUM CHLORIDE 0.9 % IV BOLUS (SEPSIS): 500.0000 mL | Freq: Once | INTRAVENOUS | Status: DC

## 2014-12-03 MED ORDER — SODIUM CHLORIDE 0.9 % IV SOLN: INTRAVENOUS | Status: DC

## 2014-12-03 MED ORDER — METHADONE HCL 10 MG PO TABS
10.0000 mg | ORAL_TABLET | Freq: Two times a day (BID) | ORAL | Status: DC
  Administered 2014-12-03 – 2014-12-07 (×7): 10 mg via ORAL
  Filled 2014-12-03 (×7): qty 1

## 2014-12-03 MED ORDER — POLYETHYLENE GLYCOL 3350 17 G PO PACK
17.0000 g | PACK | Freq: Every day | ORAL | Status: DC | PRN
  Administered 2014-12-07: 17 g via ORAL
  Filled 2014-12-03 (×2): qty 1

## 2014-12-03 MED ORDER — MIRTAZAPINE 15 MG PO TABS
15.0000 mg | ORAL_TABLET | Freq: Every day | ORAL | Status: DC
  Administered 2014-12-03 – 2014-12-06 (×4): 15 mg via ORAL
  Filled 2014-12-03 (×5): qty 1

## 2014-12-03 MED ORDER — METOPROLOL TARTRATE 25 MG PO TABS
25.0000 mg | ORAL_TABLET | Freq: Two times a day (BID) | ORAL | Status: DC
  Filled 2014-12-03 (×2): qty 1

## 2014-12-03 MED ORDER — FUROSEMIDE 10 MG/ML IJ SOLN
40.0000 mg | Freq: Once | INTRAMUSCULAR | Status: AC
  Administered 2014-12-03: 40 mg via INTRAVENOUS
  Filled 2014-12-03: qty 4

## 2014-12-03 MED ORDER — GUAIFENESIN-DM 100-10 MG/5ML PO SYRP
5.0000 mL | ORAL_SOLUTION | ORAL | Status: DC | PRN
  Filled 2014-12-03: qty 5

## 2014-12-03 MED ORDER — ENSURE COMPLETE PO LIQD
237.0000 mL | Freq: Two times a day (BID) | ORAL | Status: DC
Start: 2014-12-04 — End: 2014-12-07
  Administered 2014-12-04 – 2014-12-07 (×5): 237 mL via ORAL

## 2014-12-03 MED ORDER — PRAVASTATIN SODIUM 40 MG PO TABS
40.0000 mg | ORAL_TABLET | Freq: Every day | ORAL | Status: DC
Start: 2014-12-03 — End: 2014-12-07
  Administered 2014-12-03 – 2014-12-06 (×3): 40 mg via ORAL
  Filled 2014-12-03 (×5): qty 1

## 2014-12-03 MED ORDER — SODIUM CHLORIDE 0.9 % IV SOLN
INTRAVENOUS | Status: DC
  Administered 2014-12-03: 10 mL via INTRAVENOUS
  Administered 2014-12-03: 12:00:00 via INTRAVENOUS

## 2014-12-03 MED ORDER — SODIUM CHLORIDE 0.9 % IV SOLN: 250.0000 mL | INTRAVENOUS | Status: DC | PRN

## 2014-12-03 MED ORDER — SODIUM CHLORIDE 0.9 % IJ SOLN
3.0000 mL | Freq: Two times a day (BID) | INTRAMUSCULAR | Status: DC
  Administered 2014-12-03 – 2014-12-04 (×2): 3 mL via INTRAVENOUS

## 2014-12-03 MED ORDER — ONDANSETRON HCL 4 MG PO TABS: 4.0000 mg | ORAL_TABLET | Freq: Four times a day (QID) | ORAL | Status: DC | PRN

## 2014-12-03 MED ORDER — INSULIN ASPART 100 UNIT/ML ~~LOC~~ SOLN: 0.0000 [IU] | Freq: Every day | SUBCUTANEOUS | Status: DC

## 2014-12-03 MED ORDER — MAGNESIUM SULFATE IN D5W 10-5 MG/ML-% IV SOLN
1.0000 g | Freq: Once | INTRAVENOUS | Status: AC
  Administered 2014-12-03: 1 g via INTRAVENOUS
  Filled 2014-12-03: qty 100

## 2014-12-03 MED ORDER — HEPARIN SODIUM (PORCINE) 5000 UNIT/ML IJ SOLN
5000.0000 [IU] | Freq: Three times a day (TID) | INTRAMUSCULAR | Status: DC
Start: 2014-12-03 — End: 2014-12-07
  Administered 2014-12-03 – 2014-12-07 (×7): 5000 [IU] via SUBCUTANEOUS
  Filled 2014-12-03 (×13): qty 1

## 2014-12-03 MED ORDER — ALPRAZOLAM 0.5 MG PO TABS
0.5000 mg | ORAL_TABLET | Freq: Two times a day (BID) | ORAL | Status: DC
  Administered 2014-12-03 – 2014-12-07 (×8): 0.5 mg via ORAL
  Filled 2014-12-03 (×8): qty 1

## 2014-12-03 MED ORDER — HYDROCODONE-ACETAMINOPHEN 5-325 MG PO TABS
1.0000 | ORAL_TABLET | ORAL | Status: DC | PRN
  Administered 2014-12-05 – 2014-12-06 (×2): 1 via ORAL
  Administered 2014-12-07: 2 via ORAL
  Administered 2014-12-07: 1 via ORAL
  Filled 2014-12-03: qty 1
  Filled 2014-12-03: qty 2
  Filled 2014-12-03: qty 1
  Filled 2014-12-03: qty 2

## 2014-12-03 NOTE — ED Notes (Signed)
Dr. Ashok Cordia notified of patients CBG. Pt drinking gatorade.

## 2014-12-03 NOTE — ED Notes (Signed)
Pt returned from US

## 2014-12-03 NOTE — H&P (Signed)
Patient Demographics  Lindsay Harmon, is a 59 y.o. female  MRN: [id number]   DOB - [birth year]  Admit Date - 12/03/2014  Outpatient Primary MD for the patient is Viviana Simpler, MD   With History of -  Past Medical History  Diagnosis Date  . Depression   . GERD (gastroesophageal reflux disease)   . Hyperlipidemia   . Hypertension   . PVD (peripheral vascular disease)   . CAD (coronary artery disease)   . Familial hematuria   . NIDDM (non-insulin dependent diabetes mellitus)     with neuropathy  . IBS (irritable bowel syndrome)   . Esophageal stricture   . Obesity, unspecified   . Nephrolithiasis   . Allergy   . Arthritis       Past Surgical History  Procedure Laterality Date  . Abdominal hysterectomy    . Cesarean section    . Carpal tunnel release      in for   Chief Complaint  Patient presents with  . Urinary Retention     HPI  [name]  is a 59 y.o. female, with history of hypertension, dyslipidemia, non-insulin-dependent type 2 diabetes mellitus, irritable bowel syndrome, chronic pain, chronic kidney disease stage IV baseline creatinine around 1.7, GERD who takes fluid pills and blood pressure medications at home and has been noticing increasing swelling mostly in her legs but now in her belly and arms too for the last 7-10 days, she was being treated with diuretics by PCP but her urine output had gone down.  Came to the ER where workup was a digestive of acute on chronic renal failure, she had evidence of 1-2+ edema, elevated JVD and I was called to admit the patient. Patient denies any headache, no fever or chills, no chest pain, she has no shortness of breath orthopnea, denies any belly pain or diarrhea, no focal weakness. She does complain of weight gain, edema in her legs, over the  last 7-10 days.    Review of Systems    In addition to the HPI above,   No Fever-chills, No Headache, No changes with Vision or hearing, No problems swallowing food or Liquids, No Chest pain, Cough or Shortness of Breath, No Abdominal pain, No Nausea or Vommitting, Bowel movements are regular, No Blood in stool or Urine, No dysuria, No new skin rashes or bruises, No new joints pains-aches,  No new weakness, tingling, numbness in any extremity, No recent weight  Loss, ++ weight gain No polyuria, polydypsia or polyphagia, No significant Mental Stressors.  A full 10 point Review of Systems was done, except as stated above, all other Review of Systems were negative.   Social History History  Substance Use Topics  . Smoking status: Former Research scientist (life sciences)  . Smokeless tobacco: Never Used  . Alcohol Use: No     Comment: heavy in the past      Family History Family History  Problem Relation Age  of Onset  . Diabetes Mother       Prior to Admission medications   Medication Sig Start Date End Date Taking? Authorizing Provider  ACCU-CHEK FASTCLIX LANCETS MISC Use to check blood sugar once daily dx: E11.49 09/07/14  Yes Venia Carbon, MD  ALPRAZolam Duanne Moron) 0.25 MG tablet TAKE ONE (1) TABLET BY MOUTH EVERY DAY 09/27/14  Yes Venia Carbon, MD  aspirin 325 MG tablet Take 325 mg by mouth daily.     Yes Historical Provider, MD  gabapentin (NEURONTIN) 300 MG capsule TAKE 1 CAPSULE FOUR TIMES DAILY 11/08/14  Yes Venia Carbon, MD  glucose blood (ACCU-CHEK AVIVA PLUS) test strip Use as instructed to check blood sugar once daily dx: E11.49 09/07/14  Yes Venia Carbon, MD  lisinopril (PRINIVIL,ZESTRIL) 20 MG tablet Take 1 tablet (20 mg total) by mouth daily. 11/29/13  Yes Venia Carbon, MD  loratadine (CLARITIN) 10 MG tablet Take 10 mg by mouth 2 (two) times daily.    Yes Historical Provider, MD  lovastatin (MEVACOR) 40 MG tablet TAKE 2 TABLETS EVERY DAY 09/23/14  Yes Venia Carbon,  MD  metFORMIN (GLUCOPHAGE) 1000 MG tablet TAKE 1/2 TABLET TWICE DAILY WITH A MEAL 09/14/14  Yes Venia Carbon, MD  methadone (DOLOPHINE) 10 MG tablet Take 2 tablets four times a day. 11/30/14  Yes Venia Carbon, MD  metoprolol tartrate (LOPRESSOR) 25 MG tablet TAKE 1 TABLET TWICE DAILY 09/14/14  Yes Venia Carbon, MD  mirtazapine (REMERON) 30 MG tablet TAKE 1/2 TO 1 TABLET AT BEDTIME 09/14/14  Yes Venia Carbon, MD  nitroGLYCERIN (NITROSTAT) 0.4 MG SL tablet Place 1 tablet (0.4 mg total) under the tongue every 5 (five) minutes as needed. 11/18/12  Yes Venia Carbon, MD  pantoprazole (PROTONIX) 40 MG tablet Take 1 tablet (40 mg total) by mouth 2 (two) times daily. 11/29/13  Yes Venia Carbon, MD  zoster vaccine live, PF, (ZOSTAVAX) 29937 UNT/0.65ML injection Inject 19,400 Units into the skin once. 11/08/14  Yes Venia Carbon, MD  glimepiride (AMARYL) 1 MG tablet Take 1 tablet (1 mg total) by mouth daily with breakfast. 03/15/14   Venia Carbon, MD  torsemide (DEMADEX) 20 MG tablet TAKE 2 TABLETS TWICE DAILY 08/30/14   Venia Carbon, MD    No Known Allergies  Physical Exam  Vitals  Blood pressure 92/60, pulse 73, temperature 97.3 F (36.3 C), temperature source Oral, resp. rate 18, height _0  (1.549 m), weight 64.864 kg (143 lb), SpO2 96 %.   1. General middle aged white female lying in bed in NAD,     2. Normal affect and insight, Not Suicidal or Homicidal, Awake Alert, Oriented X 3.  3. No F.N deficits, ALL C.Nerves Intact, Strength 5/5 all 4 extremities, Sensation intact all 4 extremities, Plantars down going.  4. Ears and Eyes appear Normal, Conjunctivae clear, PERRLA. Moist Oral Mucosa.  5. Supple Neck, ++ JVD, No cervical lymphadenopathy appriciated, No Carotid Bruits.  6. Symmetrical Chest wall movement, Good air movement bilaterally, CTAB.  7. RRR, No Gallops, Rubs or Murmurs, No Parasternal Heave.  8. Positive Bowel Sounds, Abdomen Soft, No tenderness,  No organomegaly appriciated,No rebound -guarding or rigidity.  9.  No Cyanosis, Normal Skin Turgor, No Skin Rash or Bruise. !-2 + edema  10. Good muscle tone,  joints appear normal , no effusions, Normal ROM.  11. No Palpable Lymph Nodes in Neck or Axillae     Data Review  CBC  Recent Labs Lab 12/01/14 1635 12/03/14 1150  WBC 11.1* 9.7  HGB 14.9 14.6  HCT 46.7* 45.7  PLT 277.0 254  MCV 77.7* 77.9*  MCH  --  24.9*  MCHC 32.0 31.9  RDW 18.5* 18.2*  LYMPHSABS 2.6 2.4  MONOABS 0.8 0.8  EOSABS 0.1 0.0  BASOSABS 0.0 0.1   ------------------------------------------------------------------------------------------------------------------  Chemistries   Recent Labs Lab 12/01/14 1635 12/03/14 1150  NA 139 139  K 3.9 4.1  CL 97 97  CO2 32 31  GLUCOSE 40* 81  BUN 40* 41*  CREATININE 2.26* 2.84*  CALCIUM 8.1* 7.1*  AST 24 69*  ALT 40* 51*  ALKPHOS 117 115  BILITOT 0.8 0.8   ------------------------------------------------------------------------------------------------------------------ estimated creatinine clearance is 17.9 mL/min (by C-G formula based on Cr of 2.84). ------------------------------------------------------------------------------------------------------------------ No results for input(s): TSH, T4TOTAL, T3FREE, THYROIDAB in the last 72 hours.  Invalid input(s): FREET3   Coagulation profile No results for input(s): INR, PROTIME in the last 168 hours. ------------------------------------------------------------------------------------------------------------------- No results for input(s): DDIMER in the last 72 hours. -------------------------------------------------------------------------------------------------------------------  Cardiac Enzymes No results for input(s): CKMB, TROPONINI, MYOGLOBIN in the last 168 hours.  Invalid input(s):  CK ------------------------------------------------------------------------------------------------------------------ Invalid input(s): POCBNP   ---------------------------------------------------------------------------------------------------------------  Urinalysis No results found for: COLORURINE, APPEARANCEUR, LABSPEC, PHURINE, GLUCOSEU, HGBUR, BILIRUBINUR, KETONESUR, PROTEINUR, UROBILINOGEN, NITRITE, LEUKOCYTESUR  ----------------------------------------------------------------------------------------------------------------  Imaging results:   Dg Chest 2 View  12/03/2014   CLINICAL DATA:  Cough and congestion with shortness of breath. Fluid retention with decreased renal function.  EXAM: CHEST  2 VIEW  COMPARISON:  None.  FINDINGS: Patient slightly rotated to the left. Lungs are adequately inflated with mild prominence of the perihilar markings with small bilateral pleural effusions. Mild cardiomegaly and mild prominence of the main pulmonary artery segment. Mild degenerative change of the spine.  IMPRESSION: Constellation findings suggesting mild congestive heart failure.   Electronically Signed   By: Marin Olp M.D.   On: 12/03/2014 12:14   US Renal  12/03/2014   CLINICAL DATA:  Diminished urine output. Acute kidney injury. Current history of diabetes and hypertension.  EXAM: RENAL/URINARY TRACT ULTRASOUND COMPLETE  COMPARISON:  None.  FINDINGS: Right Kidney:  Length: Approximately 10.7 cm. Echogenic parenchyma. No hydronephrosis. 1.0 cm cyst arising from the lower pole. No significant focal parenchymal abnormality.  Left Kidney:  Length: Approximately 10.0 cm. Echogenic parenchyma. No hydronephrosis. No focal parenchymal abnormality.  Bladder:  Appears normal for degree of bladder distention.  Other findings:  Small amount of ascites in the right upper quadrant, left upper quadrant and in the left lower quadrant. Small bilateral pleural effusions.  IMPRESSION: 1. No evidence of  hydronephrosis involving either kidney. 2. Echogenic parenchyma involving both kidneys consistent with medical renal disease. 3. Small amount of ascites. 4. Small bilateral pleural effusions.   Electronically Signed   By: Evangeline Dakin M.D.   On: 12/03/2014 13:55      Baseline EKG, UA, echogram, renal ultrasound ordered.    Assessment & Plan   1. Acute renal failure on chronic and he disease stage IV. Patient has evidence of fluid overload but I suspect this is more right-sided heart failure, as her lungs are relatively clean, she will be admitted to a telemetry bed, check renal ultrasound, echogram, IV Lasix 1 dose tonight, obtain UA, urine electrolytes and osmolality, hold ARB/ACE . Repeat BMP in the morning. Readdress diuretic dose in the morning.   2. Essential hypertension. Blood pressure on the soft side. Home dose ARB held, skip tonight's  beta blocker also.    3. DM type II. We will hold oral hypoglycemics. Check A1c sliding scale.   4. Chronic pain. Continue methadone along with supportive care for breakthrough.   5. Diabetic peripheral neuropathy. Continue Neurontin at a reduced dose due to acute renal failure.   6. PAD. Stable continue home dose aspirin and statin for secondary prevention.     DVT Prophylaxis Heparin    AM Labs Ordered, also please review Full Orders  Family Communication: Admission, patients condition and plan of care including tests being ordered have been discussed with the patient  who indicates understanding and agree with the plan and Code Status.  Code Status Full  Likely DC to  Home  Condition GUARDED     Time spent in minutes :35    SINGH,PRASHANT K M.D on 12/03/2014 at 2:08 PM  Between 7am to 7pm - Pager - [phone]  After 7pm go to [email or web address] - [facility] Office  [phone]

## 2014-12-03 NOTE — ED Notes (Signed)
Tried to urinate and was unable to

## 2014-12-03 NOTE — ED Notes (Signed)
Pt returned from xray

## 2014-12-03 NOTE — ED Notes (Signed)
Per pt, states she saw her PCP last week who did blood work and said her kidney function is a lot worse. Pt had an abdomen ultrasound yesterday, waiting to hear the results of that. Pt states since yesterday has not been urinating as much. States she went "a little bit this morning". Pt has swelling in bilateral legs. Denies pain. Denies CP, SOB.

## 2014-12-03 NOTE — ED Provider Notes (Addendum)
CSN: [id number]     Arrival date & time 12/03/14  1105 History   First MD Initiated Contact with Patient 12/03/14 1124     Chief Complaint  Patient presents with  . Urinary Retention     (Consider location/radiation/quality/duration/timing/severity/associated sxs/prior Treatment) The history is provided by the patient.  pt with hx niddm, cad, c/o decreased urination for the past several days. States feels as if is emptying bladder fully, but that there is less urine. Symptoms moderate, persistent, constant. States has also felt swollen diffusely, including around torso, and legs, w unspecific wt increased. States pcp recently told her kidney function tests elevated. Denies hx chronic renal failure. Denies hx liver disease. No cp or sob. No fever or chills. No abd pain or nvd. No dysuria or hematuria. No flank or back pain.        Past Medical History  Diagnosis Date  . Depression   . GERD (gastroesophageal reflux disease)   . Hyperlipidemia   . Hypertension   . PVD (peripheral vascular disease)   . CAD (coronary artery disease)   . Familial hematuria   . NIDDM (non-insulin dependent diabetes mellitus)     with neuropathy  . IBS (irritable bowel syndrome)   . Esophageal stricture   . Obesity, unspecified   . Nephrolithiasis   . Allergy   . Arthritis    Past Surgical History  Procedure Laterality Date  . Abdominal hysterectomy    . Cesarean section    . Carpal tunnel release     Family History  Problem Relation Age of Onset  . Diabetes Mother    History  Substance Use Topics  . Smoking status: Former Research scientist (life sciences)  . Smokeless tobacco: Never Used  . Alcohol Use: No     Comment: heavy in the past   OB History    No data available     Review of Systems  Constitutional: Negative for fever and chills.  HENT: Negative for sore throat.   Eyes: Negative for redness.  Respiratory: Negative for cough and shortness of breath.   Cardiovascular: Positive for leg swelling.  Negative for chest pain.  Gastrointestinal: Negative for vomiting, abdominal pain and diarrhea.  Endocrine: Negative for polyuria.  Genitourinary: Negative for dysuria and flank pain.  Musculoskeletal: Negative for back pain and neck pain.  Skin: Negative for rash.  Neurological: Negative for headaches.  Hematological: Does not bruise/bleed easily.  Psychiatric/Behavioral: Negative for confusion.      Allergies  Review of patient's allergies indicates no known allergies.  Home Medications   Prior to Admission medications   Medication Sig Start Date End Date Taking? Authorizing Provider  ACCU-CHEK FASTCLIX LANCETS MISC Use to check blood sugar once daily dx: E11.49 09/07/14   Venia Carbon, MD  ALPRAZolam Duanne Moron) 0.25 MG tablet TAKE ONE (1) TABLET BY MOUTH EVERY DAY 09/27/14   Venia Carbon, MD  aspirin 325 MG tablet Take 325 mg by mouth daily.      Historical Provider, MD  gabapentin (NEURONTIN) 300 MG capsule TAKE 1 CAPSULE FOUR TIMES DAILY 11/08/14   Venia Carbon, MD  glimepiride (AMARYL) 1 MG tablet Take 1 tablet (1 mg total) by mouth daily with breakfast. 03/15/14   Venia Carbon, MD  glucose blood (ACCU-CHEK AVIVA PLUS) test strip Use as instructed to check blood sugar once daily dx: E11.49 09/07/14   Venia Carbon, MD  lisinopril (PRINIVIL,ZESTRIL) 20 MG tablet Take 1 tablet (20 mg total) by mouth daily. 11/29/13  Venia Carbon, MD  loratadine (CLARITIN) 10 MG tablet Take 10 mg by mouth 2 (two) times daily.     Historical Provider, MD  lovastatin (MEVACOR) 40 MG tablet TAKE 2 TABLETS EVERY DAY 09/23/14   Venia Carbon, MD  metFORMIN (GLUCOPHAGE) 1000 MG tablet TAKE 1/2 TABLET TWICE DAILY WITH A MEAL 09/14/14   Venia Carbon, MD  methadone (DOLOPHINE) 10 MG tablet Take 2 tablets four times a day. 11/30/14   Venia Carbon, MD  metoprolol tartrate (LOPRESSOR) 25 MG tablet TAKE 1 TABLET TWICE DAILY 09/14/14   Venia Carbon, MD  mirtazapine (REMERON) 30 MG  tablet TAKE 1/2 TO 1 TABLET AT BEDTIME 09/14/14   Venia Carbon, MD  nitroGLYCERIN (NITROSTAT) 0.4 MG SL tablet Place 1 tablet (0.4 mg total) under the tongue every 5 (five) minutes as needed. 11/18/12   Venia Carbon, MD  pantoprazole (PROTONIX) 40 MG tablet Take 1 tablet (40 mg total) by mouth 2 (two) times daily. 11/29/13   Venia Carbon, MD  torsemide (DEMADEX) 20 MG tablet TAKE 2 TABLETS TWICE DAILY 08/30/14   Venia Carbon, MD  zoster vaccine live, PF, (ZOSTAVAX) 22979 UNT/0.65ML injection Inject 19,400 Units into the skin once. 11/08/14   Venia Carbon, MD   BP 96/69 mmHg  Pulse 72  Temp(Src) 97.3 F (36.3 C) (Oral)  Resp 18  Ht _0  (1.549 m)  Wt 143 lb (64.864 kg)  BMI 27.03 kg/m2  SpO2 93% Physical Exam  Constitutional: She appears well-developed and well-nourished. No distress.  HENT:  Mouth/Throat: Oropharynx is clear and moist.  Eyes: Conjunctivae are normal. No scleral icterus.  Neck: Neck supple. No tracheal deviation present. No thyromegaly present.  Cardiovascular: Normal rate, regular rhythm, normal heart sounds and intact distal pulses.  Exam reveals no gallop and no friction rub.   No murmur heard. Pulmonary/Chest: Effort normal and breath sounds normal. No respiratory distress.  Abdominal: Soft. Normal appearance and bowel sounds are normal. She exhibits no distension and no mass. There is no tenderness. There is no rebound and no guarding.  Genitourinary:  No cva tenderness  Musculoskeletal: She exhibits edema.  Moderate edema to waist bil.   Neurological: She is alert.  Skin: Skin is warm and dry. No rash noted. She is not diaphoretic.  Psychiatric: She has a normal mood and affect.  Nursing note and vitals reviewed.   ED Course  Procedures (including critical care time) Labs Review   Results for orders placed or performed during the hospital encounter of 12/03/14  CBC with Differential/Platelet  Result Value Ref Range   WBC 9.7 4.0 - 10.5  K/uL   RBC 5.87 (H) 3.87 - 5.11 MIL/uL   Hemoglobin 14.6 12.0 - 15.0 g/dL   HCT 45.7 36.0 - 46.0 %   MCV 77.9 (L) 78.0 - 100.0 fL   MCH 24.9 (L) 26.0 - 34.0 pg   MCHC 31.9 30.0 - 36.0 g/dL   RDW 18.2 (H) 11.5 - 15.5 %   Platelets 254 150 - 400 K/uL   Neutrophils Relative % 66 43 - 77 %   Neutro Abs 6.4 1.7 - 7.7 K/uL   Lymphocytes Relative 24 12 - 46 %   Lymphs Abs 2.4 0.7 - 4.0 K/uL   Monocytes Relative 9 3 - 12 %   Monocytes Absolute 0.8 0.1 - 1.0 K/uL   Eosinophils Relative 0 0 - 5 %   Eosinophils Absolute 0.0 0.0 - 0.7 K/uL  Basophils Relative 1 0 - 1 %   Basophils Absolute 0.1 0.0 - 0.1 K/uL  Comprehensive metabolic panel  Result Value Ref Range   Sodium 139 135 - 145 mmol/L   Potassium 4.1 3.5 - 5.1 mmol/L   Chloride 97 96 - 112 mmol/L   CO2 31 19 - 32 mmol/L   Glucose, Bld 81 70 - 99 mg/dL   BUN 41 (H) 6 - 23 mg/dL   Creatinine, Ser 2.84 (H) 0.50 - 1.10 mg/dL   Calcium 7.1 (L) 8.4 - 10.5 mg/dL   Total Protein 6.2 6.0 - 8.3 g/dL   Albumin 2.9 (L) 3.5 - 5.2 g/dL   AST 69 (H) 0 - 37 U/L   ALT 51 (H) 0 - 35 U/L   Alkaline Phosphatase 115 39 - 117 U/L   Total Bilirubin 0.8 0.3 - 1.2 mg/dL   GFR calc non Af Amer 17 (L) >90 mL/min   GFR calc Af Amer 20 (L) >90 mL/min   Anion gap 11 5 - 15  CBG monitoring, ED  Result Value Ref Range   Glucose-Capillary 60 (L) 70 - 99 mg/dL   Dg Chest 2 View  12/03/2014   CLINICAL DATA:  Cough and congestion with shortness of breath. Fluid retention with decreased renal function.  EXAM: CHEST  2 VIEW  COMPARISON:  None.  FINDINGS: Patient slightly rotated to the left. Lungs are adequately inflated with mild prominence of the perihilar markings with small bilateral pleural effusions. Mild cardiomegaly and mild prominence of the main pulmonary artery segment. Mild degenerative change of the spine.  IMPRESSION: Constellation findings suggesting mild congestive heart failure.   Electronically Signed   By: Marin Olp M.D.   On: 12/03/2014 12:14       MDM   Iv ns. Labs.  Reviewed nursing notes and prior charts for additional history.   Bladder scan, 40 ccs, c/w bladder being decompressed.  Renal u/s ordered.  Renal fxn worse than prior - will admit to medical service.  bp remains soft, poor urine output w inc cr - will try ns bolus.   As current bps low compared w historic bps, trop/ecg added to workup (pt denies any chest pain or discomfort), pt also likely to need echo and u/s kidneys for further workup.   Pt denies any fever or chills.   Pt denies any recent nsaid use.  Has been on diuretic and ace inhib, just held in past day.  Discussed with Hospitalist, Dr Candiss Norse - he will admit, temp orders for tele bed.        Mirna Mires, MD 12/03/14 1330

## 2014-12-03 NOTE — Telephone Encounter (Signed)
Asked her to stop glipizide and may have to stop metformin also---awaiting ultrasound results

## 2014-12-03 NOTE — ED Notes (Signed)
Pt transported to US

## 2014-12-04 DIAGNOSIS — N179 Acute kidney failure, unspecified: Secondary | ICD-10-CM

## 2014-12-04 DIAGNOSIS — R188 Other ascites: Secondary | ICD-10-CM | POA: Insufficient documentation

## 2014-12-04 DIAGNOSIS — N184 Chronic kidney disease, stage 4 (severe): Secondary | ICD-10-CM

## 2014-12-04 DIAGNOSIS — I509 Heart failure, unspecified: Secondary | ICD-10-CM

## 2014-12-04 DIAGNOSIS — E8809 Other disorders of plasma-protein metabolism, not elsewhere classified: Secondary | ICD-10-CM | POA: Insufficient documentation

## 2014-12-04 DIAGNOSIS — I959 Hypotension, unspecified: Secondary | ICD-10-CM

## 2014-12-04 DIAGNOSIS — I10 Essential (primary) hypertension: Secondary | ICD-10-CM

## 2014-12-04 DIAGNOSIS — E1159 Type 2 diabetes mellitus with other circulatory complications: Secondary | ICD-10-CM

## 2014-12-04 LAB — PROTEIN / CREATININE RATIO, URINE
Creatinine, Urine: 53.74 mg/dL
PROTEIN CREATININE RATIO: 0.63 — AB (ref 0.00–0.15)
TOTAL PROTEIN, URINE: 34 mg/dL

## 2014-12-04 LAB — GLUCOSE, CAPILLARY
GLUCOSE-CAPILLARY: 132 mg/dL — AB (ref 70–99)
GLUCOSE-CAPILLARY: 88 mg/dL (ref 70–99)
GLUCOSE-CAPILLARY: 168 mg/dL — AB (ref 70–99)
Glucose-Capillary: 95 mg/dL (ref 70–99)

## 2014-12-04 LAB — URINE MICROSCOPIC-ADD ON

## 2014-12-04 LAB — BASIC METABOLIC PANEL
ANION GAP: 7 (ref 5–15)
BUN: 43 mg/dL — AB (ref 6–23)
CALCIUM: 6.8 mg/dL — AB (ref 8.4–10.5)
CHLORIDE: 98 mmol/L (ref 96–112)
CO2: 32 mmol/L (ref 19–32)
CREATININE: 2.74 mg/dL — AB (ref 0.50–1.10)
GFR calc Af Amer: 20 mL/min — ABNORMAL LOW (ref >90)
GFR, EST NON AFRICAN AMERICAN: 18 mL/min — AB (ref >90)
Glucose, Bld: 134 mg/dL — ABNORMAL HIGH (ref 70–99)
POTASSIUM: 3.8 mmol/L (ref 3.5–5.1)
Sodium: 137 mmol/L (ref 135–145)

## 2014-12-04 LAB — URINALYSIS, ROUTINE W REFLEX MICROSCOPIC
Glucose, UA: NEGATIVE mg/dL
Hgb urine dipstick: NEGATIVE
Ketones, ur: NEGATIVE mg/dL
LEUKOCYTES UA: NEGATIVE
NITRITE: NEGATIVE
Protein, ur: 30 mg/dL — AB
Specific Gravity, Urine: 1.013 (ref 1.005–1.030)
Urobilinogen, UA: 1 mg/dL (ref 0.0–1.0)
pH: 5 (ref 5.0–8.0)

## 2014-12-04 LAB — CBC
HCT: 42.7 % (ref 36.0–46.0)
HEMOGLOBIN: 13.3 g/dL (ref 12.0–15.0)
MCH: 24 pg — ABNORMAL LOW (ref 26.0–34.0)
MCHC: 31.1 g/dL (ref 30.0–36.0)
MCV: 76.9 fL — ABNORMAL LOW (ref 78.0–100.0)
Platelets: 246 10*3/uL (ref 150–400)
RBC: 5.55 MIL/uL — ABNORMAL HIGH (ref 3.87–5.11)
RDW: 18.2 % — AB (ref 11.5–15.5)
WBC: 10.4 10*3/uL (ref 4.0–10.5)

## 2014-12-04 LAB — TROPONIN I
Troponin I: 0.11 ng/mL — ABNORMAL HIGH (ref <0.031)
Troponin I: 0.11 ng/mL — ABNORMAL HIGH (ref <0.031)

## 2014-12-04 LAB — LACTIC ACID, PLASMA: Lactic Acid, Venous: 2.1 mmol/L (ref 0.5–2.0)

## 2014-12-04 LAB — BRAIN NATRIURETIC PEPTIDE: B NATRIURETIC PEPTIDE 5: 959.4 pg/mL — AB (ref 0.0–100.0)

## 2014-12-04 LAB — SODIUM, URINE, RANDOM: Sodium, Ur: 31 mmol/L

## 2014-12-04 LAB — OSMOLALITY, URINE: OSMOLALITY UR: 302 mosm/kg — AB (ref 390–1090)

## 2014-12-04 LAB — CREATININE, URINE, RANDOM: Creatinine, Urine: 75.51 mg/dL

## 2014-12-04 NOTE — Progress Notes (Signed)
Patient unable to void . Bladder scan revealed 340cc. In and out cath done obtained 350cc of amber colored urine.

## 2014-12-04 NOTE — Consult Note (Signed)
Reason for Consult:sob and peripheral edema and hypotension  Referring Physician: Dr. Rob Bunting is an 59 y.o. female.   HPI: the patient is a 59 yo woman with a h/o HTN, DM, chronic edema who was admitted with peripheral edema and abdominal swelling. She has been treated with diuretic therapy, but has developed hypotension. The patient denies fevers or chills. She has no chest discomfort. She has not had syncope. Her 2-D echo is pending. Her ECG is nonacute. Her QT interval is prolonged.  PMH: Past Medical History  Diagnosis Date  . Depression   . GERD (gastroesophageal reflux disease)   . Hyperlipidemia   . Hypertension   . PVD (peripheral vascular disease)   . CAD (coronary artery disease)   . Familial hematuria   . NIDDM (non-insulin dependent diabetes mellitus)     with neuropathy  . IBS (irritable bowel syndrome)   . Esophageal stricture   . Obesity, unspecified   . Nephrolithiasis   . Allergy   . Arthritis     PSHX: Past Surgical History  Procedure Laterality Date  . Abdominal hysterectomy    . Cesarean section    . Carpal tunnel release      FAMHX: Family History  Problem Relation Age of Onset  . Diabetes Mother     Social History:  reports that she has quit smoking. She has never used smokeless tobacco. She reports that she does not drink alcohol or use illicit drugs.  Allergies: No Known Allergies  Medications: Reviewed  Dg Chest 2 View  12/03/2014   CLINICAL DATA:  Cough and congestion with shortness of breath. Fluid retention with decreased renal function.  EXAM: CHEST  2 VIEW  COMPARISON:  None.  FINDINGS: Patient slightly rotated to the left. Lungs are adequately inflated with mild prominence of the perihilar markings with small bilateral pleural effusions. Mild cardiomegaly and mild prominence of the main pulmonary artery segment. Mild degenerative change of the spine.  IMPRESSION: Constellation findings suggesting mild congestive heart  failure.   Electronically Signed   By: Marin Olp M.D.   On: 12/03/2014 12:14   US Renal  12/03/2014   CLINICAL DATA:  Diminished urine output. Acute kidney injury. Current history of diabetes and hypertension.  EXAM: RENAL/URINARY TRACT ULTRASOUND COMPLETE  COMPARISON:  None.  FINDINGS: Right Kidney:  Length: Approximately 10.7 cm. Echogenic parenchyma. No hydronephrosis. 1.0 cm cyst arising from the lower pole. No significant focal parenchymal abnormality.  Left Kidney:  Length: Approximately 10.0 cm. Echogenic parenchyma. No hydronephrosis. No focal parenchymal abnormality.  Bladder:  Appears normal for degree of bladder distention.  Other findings:  Small amount of ascites in the right upper quadrant, left upper quadrant and in the left lower quadrant. Small bilateral pleural effusions.  IMPRESSION: 1. No evidence of hydronephrosis involving either kidney. 2. Echogenic parenchyma involving both kidneys consistent with medical renal disease. 3. Small amount of ascites. 4. Small bilateral pleural effusions.   Electronically Signed   By: Evangeline Dakin M.D.   On: 12/03/2014 13:55    ROS  As stated in the HPI and negative for all other systems.  Physical Exam  Vitals:Blood pressure 93/54, pulse 79, temperature 98.2 F (36.8 C), temperature source Oral, resp. rate 20, height _0  (1.549 m), weight 146 lb 2.6 oz (66.3 kg), SpO2 96 %.  Well appearing NAD HEENT: Unremarkable Neck:  7 cm JVD, no thyromegally Back:  No CVA tenderness Lungs:  Clear bilaterally in the bases,  with no wheezes or rhonchi. HEART:  Regular rate rhythm, no murmurs, no rubs, no clicks Abd:  soft, positive bowel sounds, no organomegally, no rebound, no guarding Ext:  2 plus pulses, no edema, no cyanosis, no clubbing Skin:  No rashes no nodules Neuro:  CN II through XII intact, motor grossly intact  Normal sinus rhythm with nonspecific ST-T wave abnormality  Assessment/Plan: 1. Acute on chronic peripheral edema 2.  Possible right-sided heart failure, with unknown right-sided heart pressures, awaiting 2-D echo 3. Stage V renal failure 4. Hypertension now hypotension Discussion: The patient has a difficult constellation of findings. It will be very difficult to diurese her additional fluid because of low blood pressure and renal insufficiency. She needs a low-sodium diet. We will review her 2-D echo to evaluate her pulmonary pressures. Agree with holding diuretic therapy for now. Avoid blood pressure lowering medications at this time. Her liver function tests are not abnormal, and I do not think she has intrinsic hepatic disease. Additional recommendations will follow based on the results of her echo. If her pulmonary pressures are high, right heart catheterization would be indicated.  Carleene Overlie TaylorMD 12/04/2014, 3:51 PM

## 2014-12-04 NOTE — Progress Notes (Signed)
12/04/14 Lactic Acid level was 2.1 at 1130pm.

## 2014-12-04 NOTE — Progress Notes (Signed)
Echocardiogram 2D Echocardiogram has been performed.  Doyle Askew 12/04/2014, 3:33 PM

## 2014-12-04 NOTE — Progress Notes (Signed)
PROGRESS NOTE  Lindsay Harmon VXB:[id number] DOB: [birth year] DOA: 12/03/2014 PCP: Lindsay Simpler, MD Brief history 59 year old female with a history of hypertension, diabetes, hyperlipidemia, CKD stage III presents with 10 days of increasing lower extremity edema and abdominal girth. Remarkably, the patient denied any chest discomfort or any shortness of breath. She denies any fevers, chills, coughing, nausea, vomiting, diarrhea. The patient clarified her torsemide dose which she states she takes 20 mg tablets, 2 tablets twice a day. She has been on this for at least 5 years. She went to see her primary care provider on 12/01/2014. On her labs returned, she was instructed to stop her glipizide and metformin. The patient states that she also stopped her torsemide on the day prior to admission. The patient has over 50-pack-year tobacco history, but has quit smoking cigarettes for several years. She previously drink heavily for a period of 4 years when she was in her 44s, but has not had any alcohol since then. The patient states that she sleeps in a recliner but this is for her back pain, not specifically for any breathing issues Assessment/Plan: Lower extremity edema and increasing abdominal girth -Considerations include CHF, liver cirrhosis, and possible nephrotic syndrome -Echocardiogram -Abdominal ultrasound to evaluate liver architecture as well as for splenomegaly and ascites -Renal ultrasound was negative for hydronephrosis but showed small bilateral pleural effusions and a small amount of ascites -Urine protein/creatinine ratio -The patient did receive furosemide 40 mg IV 1 on 12/03/2014 Acute on chronic renal failure (CKD 3) -Renal ultrasound negative for hydronephrosis -Serum creatinine actually showed mild improvement with furosemide IV -Nephrology consultation of worsens Hypotension -pt appears clinically fluid overloaded  -consult cardiology -hold lasix for right not as pt  is not having any dyspnea-->personally checked vitals this am 98.1--HR 80--RR16--85/51--97-98% on 2L -d/c metoprolol Hypoxemia -combination of pleural effusion and undiagnosed COPD -supplemental oxygen Elevated troponin in setting of renal failure -doubt ACS -no CP -cycle troponins Transaminasemia -abd [place] as discussed -may be hepatic congestion from fluid overload Chronic pain -continue home dose methadone and Gabapentin Diabetes mellitus type 2 -11/08/14 hemoglobin A1c 8.1 -NovoLog sliding scale -Patient developed hypoglycemia in the setting of worsening renal failure and Amaryl -Discontinue Amaryl and metformin   Family Communication:   Son updated at beside 2/7 Disposition Plan:   Home when medically stable        Procedures/Studies: Dg Chest 2 View  12/03/2014   CLINICAL DATA:  Cough and congestion with shortness of breath. Fluid retention with decreased renal function.  EXAM: CHEST  2 VIEW  COMPARISON:  None.  FINDINGS: Patient slightly rotated to the left. Lungs are adequately inflated with mild prominence of the perihilar markings with small bilateral pleural effusions. Mild cardiomegaly and mild prominence of the main pulmonary artery segment. Mild degenerative change of the spine.  IMPRESSION: Constellation findings suggesting mild congestive heart failure.   Electronically Signed   By: Marin Olp M.D.   On: 12/03/2014 12:14   US Renal  12/03/2014   CLINICAL DATA:  Diminished urine output. Acute kidney injury. Current history of diabetes and hypertension.  EXAM: RENAL/URINARY TRACT ULTRASOUND COMPLETE  COMPARISON:  None.  FINDINGS: Right Kidney:  Length: Approximately 10.7 cm. Echogenic parenchyma. No hydronephrosis. 1.0 cm cyst arising from the lower pole. No significant focal parenchymal abnormality.  Left Kidney:  Length: Approximately 10.0 cm. Echogenic parenchyma. No hydronephrosis. No focal parenchymal abnormality.  Bladder:  Appears normal for degree of bladder  distention.  Other findings:  Small amount of ascites in the right upper quadrant, left upper quadrant and in the left lower quadrant. Small bilateral pleural effusions.  IMPRESSION: 1. No evidence of hydronephrosis involving either kidney. 2. Echogenic parenchyma involving both kidneys consistent with medical renal disease. 3. Small amount of ascites. 4. Small bilateral pleural effusions.   Electronically Signed   By: Evangeline Dakin M.D.   On: 12/03/2014 13:55         Subjective: Patient denies fevers, chills, headache, chest pain, dyspnea, nausea, vomiting, diarrhea, abdominal pain, dysuria, hematuria   Objective: Filed Vitals:   12/03/14 1424 12/03/14 1544 12/03/14 2211 12/04/14 0525  BP: 95/54 1_0  Pulse: 74 75 80 78  Temp:  98.5 F (36.9 C) 98.3 F (36.8 C) 98.4 F (36.9 C)  TempSrc:  Oral Oral Oral  Resp: _1 Height:      Weight:    66.3 kg (146 lb 2.6 oz)  SpO2: 93% 92% 90% 92%    Intake/Output Summary (Last 24 hours) at 12/04/14 7741 Last data filed at 12/04/14 0900  Gross per 24 hour  Intake 1530.5 ml  Output    350 ml  Net 1180.5 ml   Weight change:  Exam:   General:  Pt is alert, follows commands appropriately, not in acute distress  HEENT: No icterus, No thrush, No neck mass, [place]/AT  Cardiovascular: RRR, S1/S2, no rubs, no gallops  Respiratory: Bilateral crackles. No wheezing.  Abdomen: Soft/+BS, non tender, non distended, no guarding  Extremities: 2+LE edema, No lymphangitis, No petechiae, No rashes, no synovitis  Data Reviewed: Basic Metabolic Panel:  Recent Labs Lab 12/01/14 1635 12/03/14 1150 12/04/14 0605  NA 139 139 137  K 3.9 4.1 3.8  CL 97 97 98  CO2 32 31 32  GLUCOSE 40* 81 134*  BUN 40* 41* 43*  CREATININE 2.26* 2.84* 2.74*  CALCIUM 8.1* 7.1* 6.8*   Liver Function Tests:  Recent Labs Lab 12/01/14 1635 12/03/14 1150  AST 24 69*  ALT 40* 51*  ALKPHOS 117 115  BILITOT 0.8 0.8  PROT 6.6 6.2    ALBUMIN 3.4* 2.9*   No results for input(s): LIPASE, AMYLASE in the last 168 hours. No results for input(s): AMMONIA in the last 168 hours. CBC:  Recent Labs Lab 12/01/14 1635 12/03/14 1150 12/04/14 0605  WBC 11.1* 9.7 10.4  NEUTROABS 7.6 6.4  --   HGB 14.9 14.6 13.3  HCT 46.7* 45.7 42.7  MCV 77.7* 77.9* 76.9*  PLT 277.0 254 246   Cardiac Enzymes:  Recent Labs Lab 12/03/14 1443  TROPONINI 0.15*   BNP: Invalid input(s): POCBNP CBG:  Recent Labs Lab 12/03/14 1133 12/03/14 1634 12/03/14 2158 12/04/14 0813  GLUCAP 60* 102* 175* 132*    No results found for this or any previous visit (from the past 240 hour(s)).   Scheduled Meds: . ALPRAZolam  0.5 mg Oral BID  . aspirin  325 mg Oral Daily  . feeding supplement (ENSURE COMPLETE)  237 mL Oral BID BM  . gabapentin  200 mg Oral BID  . heparin  5,000 Units Subcutaneous 3 times per day  . insulin aspart  0-5 Units Subcutaneous QHS  . insulin aspart  0-9 Units Subcutaneous TID WC  . methadone  10 mg Oral Q12H  . mirtazapine  15 mg Oral QHS  . pantoprazole  40 mg Oral BID  . pravastatin  40 mg Oral q1800  . sodium chloride  3  mL Intravenous Q12H  . sodium chloride  3 mL Intravenous Q12H   Continuous Infusions: . sodium chloride 10 mL (12/03/14 1543)     Danilyn Cocke, DO  [facility] Pager [phone]  If 7PM-7AM, please contact night-coverage [email or web address] Password [email or web address] 12/04/2014, 9:52 AM   LOS: 1 day

## 2014-12-05 ENCOUNTER — Inpatient Hospital Stay (HOSPITAL_COMMUNITY): Payer: Commercial Managed Care - HMO

## 2014-12-05 ENCOUNTER — Telehealth: Payer: Self-pay

## 2014-12-05 ENCOUNTER — Encounter (HOSPITAL_COMMUNITY)
Admission: EM | Disposition: A | Discharge status: Home / Self Care | Payer: Self-pay | Source: Home / Self Care | Attending: Internal Medicine

## 2014-12-05 ENCOUNTER — Encounter: Payer: Self-pay | Admitting: Internal Medicine

## 2014-12-05 DIAGNOSIS — I248 Other forms of acute ischemic heart disease: Secondary | ICD-10-CM | POA: Diagnosis present

## 2014-12-05 DIAGNOSIS — I5032 Chronic diastolic (congestive) heart failure: Secondary | ICD-10-CM | POA: Diagnosis present

## 2014-12-05 DIAGNOSIS — I270 Primary pulmonary hypertension: Secondary | ICD-10-CM

## 2014-12-05 HISTORY — PX: RIGHT HEART CATHETERIZATION: SHX5447

## 2014-12-05 LAB — POCT I-STAT 3, VENOUS BLOOD GAS (G3P V)
ACID-BASE EXCESS: 4 mmol/L — AB (ref 0.0–2.0)
ACID-BASE EXCESS: 6 mmol/L — AB (ref 0.0–2.0)
Acid-Base Excess: 3 mmol/L — ABNORMAL HIGH (ref 0.0–2.0)
Acid-Base Excess: 3 mmol/L — ABNORMAL HIGH (ref 0.0–2.0)
Acid-Base Excess: 4 mmol/L — ABNORMAL HIGH (ref 0.0–2.0)
BICARBONATE: 30.2 meq/L — AB (ref 20.0–24.0)
BICARBONATE: 30.2 meq/L — AB (ref 20.0–24.0)
BICARBONATE: 30.2 meq/L — AB (ref 20.0–24.0)
Bicarbonate: 31 mEq/L — ABNORMAL HIGH (ref 20.0–24.0)
Bicarbonate: 32.6 mEq/L — ABNORMAL HIGH (ref 20.0–24.0)
O2 SAT: 65 %
O2 SAT: 73 %
O2 Saturation: 67 %
O2 Saturation: 71 %
O2 Saturation: 72 %
PCO2 VEN: 55.3 mmHg — AB (ref 45.0–50.0)
PO2 VEN: 37 mmHg (ref 30.0–45.0)
PO2 VEN: 41 mmHg (ref 30.0–45.0)
PO2 VEN: 40 mmHg (ref 30.0–45.0)
TCO2: 32 mmol/L (ref 0–100)
TCO2: 32 mmol/L (ref 0–100)
TCO2: 32 mmol/L (ref 0–100)
TCO2: 33 mmol/L (ref 0–100)
TCO2: 34 mmol/L (ref 0–100)
pCO2, Ven: 53 mmHg — ABNORMAL HIGH (ref 45.0–50.0)
pCO2, Ven: 54.3 mmHg — ABNORMAL HIGH (ref 45.0–50.0)
pCO2, Ven: 53.3 mmHg — ABNORMAL HIGH (ref 45.0–50.0)
pCO2, Ven: 54.2 mmHg — ABNORMAL HIGH (ref 45.0–50.0)
pH, Ven: 7.346 — ABNORMAL HIGH (ref 7.250–7.300)
pH, Ven: 7.353 — ABNORMAL HIGH (ref 7.250–7.300)
pH, Ven: 7.364 — ABNORMAL HIGH (ref 7.250–7.300)
pH, Ven: 7.365 — ABNORMAL HIGH (ref 7.250–7.300)
pH, Ven: 7.395 — ABNORMAL HIGH (ref 7.250–7.300)
pO2, Ven: 36 mmHg (ref 30.0–45.0)
pO2, Ven: 39 mmHg (ref 30.0–45.0)

## 2014-12-05 LAB — BASIC METABOLIC PANEL
ANION GAP: 11 (ref 5–15)
BUN: 36 mg/dL — ABNORMAL HIGH (ref 6–23)
CALCIUM: 7.7 mg/dL — AB (ref 8.4–10.5)
CO2: 30 mmol/L (ref 19–32)
CREATININE: 2.2 mg/dL — AB (ref 0.50–1.10)
Chloride: 99 mmol/L (ref 96–112)
GFR calc non Af Amer: 23 mL/min — ABNORMAL LOW (ref >90)
GFR, EST AFRICAN AMERICAN: 27 mL/min — AB (ref >90)
Glucose, Bld: 114 mg/dL — ABNORMAL HIGH (ref 70–99)
POTASSIUM: 3.9 mmol/L (ref 3.5–5.1)
Sodium: 140 mmol/L (ref 135–145)

## 2014-12-05 LAB — GLUCOSE, CAPILLARY: GLUCOSE-CAPILLARY: 104 mg/dL — AB (ref 70–99)

## 2014-12-05 LAB — HEMOGLOBIN A1C
Hgb A1c MFr Bld: 7.6 % — ABNORMAL HIGH (ref 4.8–5.6)
Mean Plasma Glucose: 171 mg/dL

## 2014-12-05 LAB — SURGICAL PCR SCREEN
MRSA, PCR: NEGATIVE
Staphylococcus aureus: NEGATIVE

## 2014-12-05 LAB — MAGNESIUM: MAGNESIUM: 1.8 mg/dL (ref 1.5–2.5)

## 2014-12-05 SURGERY — RIGHT HEART CATH

## 2014-12-05 MED ORDER — CHLORHEXIDINE GLUCONATE CLOTH 2 % EX PADS
6.0000 | MEDICATED_PAD | Freq: Once | CUTANEOUS | Status: AC
  Administered 2014-12-05: 6 via TOPICAL

## 2014-12-05 MED ORDER — SODIUM CHLORIDE 0.9 % IV SOLN: 250.0000 mL | INTRAVENOUS | Status: DC | PRN

## 2014-12-05 MED ORDER — TECHNETIUM TO 99M ALBUMIN AGGREGATED
6.0000 | Freq: Once | INTRAVENOUS | Status: AC | PRN
Start: 2014-12-05 — End: 2014-12-05
  Administered 2014-12-05: 6 via INTRAVENOUS

## 2014-12-05 MED ORDER — ACETAMINOPHEN 325 MG PO TABS: 650.0000 mg | ORAL_TABLET | ORAL | Status: DC | PRN

## 2014-12-05 MED ORDER — TECHNETIUM TC 99M DIETHYLENETRIAME-PENTAACETIC ACID
40.0000 | Freq: Once | INTRAVENOUS | Status: AC | PRN
Start: 2014-12-05 — End: 2014-12-05

## 2014-12-05 MED ORDER — HEPARIN (PORCINE) IN NACL 2-0.9 UNIT/ML-% IJ SOLN
INTRAMUSCULAR | Status: AC
  Filled 2014-12-05: qty 500

## 2014-12-05 MED ORDER — SODIUM CHLORIDE 0.9 % IJ SOLN: 3.0000 mL | INTRAMUSCULAR | Status: DC | PRN

## 2014-12-05 MED ORDER — LIDOCAINE HCL (PF) 1 % IJ SOLN
INTRAMUSCULAR | Status: AC
  Filled 2014-12-05: qty 30

## 2014-12-05 MED ORDER — SODIUM CHLORIDE 0.9 % IJ SOLN
3.0000 mL | Freq: Two times a day (BID) | INTRAMUSCULAR | Status: DC
  Administered 2014-12-06: 3 mL via INTRAVENOUS

## 2014-12-05 MED ORDER — ONDANSETRON HCL 4 MG/2ML IJ SOLN: 4.0000 mg | Freq: Four times a day (QID) | INTRAMUSCULAR | Status: DC | PRN

## 2014-12-05 NOTE — Progress Notes (Signed)
HPI: the patient is a 59 yo woman with a h/o HTN, DM, chronic edema who was admitted with worsening peripheral edema and abdominal swelling. She has been treated with diuretic therapy, but has developed hypotension.  She has no chest discomfort. She has not had syncope. Denies cough. History of tobacco abuse in the past but quit many years ago. No history of COPD or PE.   PMH: Past Medical History  Diagnosis Date  . Depression   . GERD (gastroesophageal reflux disease)   . Hyperlipidemia   . Hypertension   . PVD (peripheral vascular disease)   . CAD (coronary artery disease)   . Familial hematuria   . NIDDM (non-insulin dependent diabetes mellitus)     with neuropathy  . IBS (irritable bowel syndrome)   . Esophageal stricture   . Obesity, unspecified   . Nephrolithiasis   . Allergy   . Arthritis     PSHX: Past Surgical History  Procedure Laterality Date  . Abdominal hysterectomy    . Cesarean section    . Carpal tunnel release      FAMHX: Family History  Problem Relation Age of Onset  . Diabetes Mother     Social History:  reports that she has quit smoking. She has never used smokeless tobacco. She reports that she does not drink alcohol or use illicit drugs.  Allergies: No Known Allergies  Medications: Reviewed   Imaging Results (Last 48 hours)    Dg Chest 2 View  12/03/2014 CLINICAL DATA: Cough and congestion with shortness of breath. Fluid retention with decreased renal function. EXAM: CHEST 2 VIEW COMPARISON: None. FINDINGS: Patient slightly rotated to the left. Lungs are adequately inflated with mild prominence of the perihilar markings with small bilateral pleural effusions. Mild cardiomegaly and mild prominence of the main pulmonary artery segment. Mild degenerative change of the spine. IMPRESSION: Constellation findings suggesting mild congestive heart failure. Electronically Signed  By: Marin Olp M.D. On: 12/03/2014 12:14   US Renal  12/03/2014 CLINICAL DATA: Diminished urine output. Acute kidney injury. Current history of diabetes and hypertension. EXAM: RENAL/URINARY TRACT ULTRASOUND COMPLETE COMPARISON: None. FINDINGS: Right Kidney: Length: Approximately 10.7 cm. Echogenic parenchyma. No hydronephrosis. 1.0 cm cyst arising from the lower pole. No significant focal parenchymal abnormality. Left Kidney: Length: Approximately 10.0 cm. Echogenic parenchyma. No hydronephrosis. No focal parenchymal abnormality. Bladder: Appears normal for degree of bladder distention. Other findings: Small amount of ascites in the right upper quadrant, left upper quadrant and in the left lower quadrant. Small bilateral pleural effusions. IMPRESSION: 1. No evidence of hydronephrosis involving either kidney. 2. Echogenic parenchyma involving both kidneys consistent with medical renal disease. 3. Small amount of ascites. 4. Small bilateral pleural effusions. Electronically Signed By: Evangeline Dakin M.D. On: 12/03/2014 13:55     ROS  As stated in the HPI and negative for all other systems.  Physical Exam  Vitals:Blood pressure 93/54, pulse 79, temperature 98.2 F (36.8 C), temperature source Oral, resp. rate 20, height _0  (1.549 m), weight 146 lb 2.6 oz (66.3 kg), SpO2 96 %.  Well appearing NAD HEENT: Unremarkable Neck: 7 cm JVD, no thyromegally Back: No CVA tenderness Lungs: right basilar rales. HEART: Regular rate rhythm, no murmurs, no rubs, no clicks Abd: soft, positive bowel sounds, no organomegally, no rebound, no guarding Ext: 2 plus pulses, 2+ edema, no cyanosis, no clubbing Skin: No rashes no nodules Neuro: CN II through XII intact, motor grossly intact  Normal sinus rhythm with nonspecific ST-T wave abnormality. I have  personally reviewed and interpreted this study.  Echo:Transthoracic Echocardiography  Patient:  Lindsay Harmon, Lindsay Harmon MR #:     [id number] Study Date: 12/04/2014 Gender:   F Age:    62 Height:   442 cm Weight:   66.4 kg BSA:    2.59 m^2 Pt. Status: Room:    5W23C  ATTENDING  Regis Bill [id number] [place] Bigness [id number] SONOGRAPHER Iverson Alamin, RCS PERFORMING  [facility], Inpatient  cc:  ------------------------------------------------------------------- LV EF: 60% -  65%  ------------------------------------------------------------------- Indications:   428.0 CHF.  ------------------------------------------------------------------- Study Conclusions  - Left ventricle: The cavity size was normal. Systolic function was normal. The estimated ejection fraction was in the range of 60% to 65%. Wall motion was normal; there were no regional wall motion abnormalities. There was an increased relative contribution of atrial contraction to ventricular filling. Doppler parameters are consistent with abnormal left ventricular relaxation (grade 1 diastolic dysfunction). - Aortic valve: Mild thickening and calcification, consistent with sclerosis. Valve area (Vmax): 1.25 cm^2. - Mitral valve: Calcified annulus. - Right ventricle: The cavity size was moderately dilated. Wall thickness was normal. Systolic function was moderately reduced. - Right atrium: The atrium was moderately dilated. - Tricuspid valve: There was mild-moderate regurgitation. - Pulmonary arteries: PA peak pressure: 71 mm Hg (S).  Impressions:  - The right ventricular systolic pressure was increased consistent with severe pulmonary hypertension.  Transthoracic echocardiography. M-mode, complete 2D, spectral Doppler, and color Doppler. Birthdate: Patient birthdate: 01/28/1956. Age: Patient is 59 yr old. Sex: Gender: female. BMI: 3.4 kg/m^2. Blood pressure:   86/62 Patient status: Inpatient. Study date: Study date:  12/04/2014. Study time: 02:01 PM.  -------------------------------------------------------------------  ------------------------------------------------------------------- Left ventricle: The cavity size was normal. Systolic function was normal. The estimated ejection fraction was in the range of 60% to 65%. Wall motion was normal; there were no regional wall motion abnormalities. There was an increased relative contribution of atrial contraction to ventricular filling. Doppler parameters are consistent with abnormal left ventricular relaxation (grade 1 diastolic dysfunction).  ------------------------------------------------------------------- Aortic valve:  Trileaflet. Mild thickening and calcification, consistent with sclerosis. Mobility was not restricted. Doppler: Transvalvular velocity was within the normal range. There was no stenosis. There was no regurgitation.  Valve area (Vmax): 1.25 cm^2. Indexed valve area (Vmax): 0.48 cm^2/m^2.  Peak gradient (S): 16 mm Hg.  ------------------------------------------------------------------- Aorta: Aortic root: The aortic root was normal in size.  ------------------------------------------------------------------- Mitral valve:  Calcified annulus. Mobility was not restricted. Doppler: Transvalvular velocity was within the normal range. There was no evidence for stenosis. There was no regurgitation.  Peak gradient (D): 2 mm Hg.  ------------------------------------------------------------------- Left atrium: The atrium was normal in size.  ------------------------------------------------------------------- Right ventricle: The cavity size was moderately dilated. Wall thickness was normal. Systolic function was moderately reduced.  ------------------------------------------------------------------- Pulmonic valve:  Structurally normal valve.  Cusp separation was normal. Doppler: Transvalvular velocity was  within the normal range. There was no evidence for stenosis. There was no regurgitation.  ------------------------------------------------------------------- Tricuspid valve:  Structurally normal valve.  Doppler: Transvalvular velocity was within the normal range. There was mild-moderate regurgitation.  ------------------------------------------------------------------- Pulmonary artery:  The main pulmonary artery was normal-sized. Systolic pressure was within the normal range.  ------------------------------------------------------------------- Right atrium: The atrium was moderately dilated.  ------------------------------------------------------------------- Pericardium: There was no pericardial effusion.  ------------------------------------------------------------------- Systemic veins: Inferior vena cava: The vessel was dilated. The respirophasic diameter changes were blunted (< 50%), consistent with elevated central venous pressure.  -------------------------------------------------------------------  Measurements  Left ventricle              Value     Reference LV ID, ED, PLAX chordal      (L)   34.2 mm    43 - 52 LV ID, ES, PLAX chordal      (L)   18.6 mm    23 - 38 LV fx shortening, PLAX chordal      46  %    >=29 LV PW thickness, ED            10.3 mm    --------- IVS/LV PW ratio, ED            1.15      <=1.3 LV e&', lateral              10.1 cm/s   --------- LV E/e&', lateral             7.71      --------- LV e&', medial               7.9  cm/s   --------- LV E/e&', medial              9.86      --------- LV e&', average              9   cm/s   --------- LV E/e&', average             8.66      ---------  Ventricular septum            Value      Reference IVS thickness, ED             11.8 mm    ---------  LVOT                   Value     Reference LVOT ID, S                16  mm    --------- LVOT area                 2.01 cm^2   --------- LVOT peak velocity, S           123  cm/s   --------- LVOT peak gradient, S           6   mm Hg  ---------  Aortic valve               Value     Reference Aortic peak gradient, S          16  mm Hg  --------- Aortic valve area, peak velocity     1.25 cm^2   --------- Aortic valve area/bsa, peak        0.48 cm^2/m^2 --------- velocity  Aorta                   Value     Reference Aortic root ID, ED            25  mm    ---------  Left atrium                Value     Reference LA ID, A-P, ES              43  mm    --------- LA ID/bsa, A-P              1.66  cm/m^2  <=2.2 LA volume, S               47  ml    --------- LA volume/bsa, S             18.1 ml/m^2  --------- LA volume, ES, 1-p A4C          39  ml    --------- LA volume/bsa, ES, 1-p A4C        15.1 ml/m^2  --------- LA volume, ES, 1-p A2C          55  ml    --------- LA volume/bsa, ES, 1-p A2C        21.2 ml/m^2  ---------  Mitral valve               Value     Reference Mitral E-wave peak velocity        77.9 cm/s   --------- Mitral A-wave peak velocity        95.6 cm/s   --------- Mitral deceleration time         187  ms    150 - 230 Mitral peak gradient, D          2   mm Hg  --------- Mitral E/A ratio, peak          0.8      ---------  Pulmonary arteries             Value     Reference PA pressure, S, DP        (H)   71  mm Hg  <=30  Tricuspid valve              Value     Reference Tricuspid regurg peak velocity      397  cm/s   --------- Tricuspid peak RV-RA gradient       63  mm Hg  ---------  Right ventricle              Value     Reference RV s&', lateral, S             6.9  cm/s   ---------  Legend: (L) and (H) mark values outside specified reference range.  ------------------------------------------------------------------- Prepared and Electronically Authenticated by  Fransico Him, MD 2016-02-07T16:51:29  Assessment/Plan: 1. Acute on chronic right heart failure with Echo evidence of severe pulmonary HTN. Etiology of pulmonary HTN is unclear. Recommend right heart cath today to assess pulmonary and left sided filling pressures. Need to consider PE workup. Not a candidate for contrast studies so may consider V/Q scan/ LE venous dopplers. ? High resolution chest CT without contrast. May need to consider pulmonary evaluation.  2. Stage V renal failure 3. Troponin elevation with flat trajectory. Most likely demand ischemia from right heart failure.  4. Hypertension now hypotension  Peter Martinique MD, [facility]  12/05/2014 8:01 AM

## 2014-12-05 NOTE — Progress Notes (Addendum)
   Chart reviewed. Patient seen and examined in cath lab.   RHC numbers reviewed.   She has smoked 1.5-2ppd for 40 years. Says she quit over 6 years ago. Has clubbing and severe hypoxia on exam. CXR with severe hyperinflation. VQ negative for PE. She says she snores heavily  Cath with severe PAH with normal PCWP and normal output.   Suspect severe PAH due to chronic hypoxia in setting of severe COPD and OSA (WHO Group III) +/- small component of diastolic HF (WHO Group II)   Recommend:  1) PFTs with DLCO and ABG 2) Outpatient sleep study 3) Supplemental O2 to keep sats >= 90% at all times 4) Pulmonary rehab 5) Doubt she will be candidate for selective pulmonary vasodilators but will await results of PFTs.  6) Continue home torsemide dosing with sliding scale to keep weight stable.   Advanced HF team will follow.  Benay Spice 7:53 PM

## 2014-12-05 NOTE — Progress Notes (Signed)
Medicare Important Message given?  YES (If response is "NO", the following Medicare IM given date fields will be blank) Date Medicare IM given:  12/05/14 Medicare IM given by:  Tomi Bamberger

## 2014-12-05 NOTE — Progress Notes (Signed)
I discussed these results with the patient who is now hospitalized in [facility]

## 2014-12-05 NOTE — H&P (View-Only) (Signed)
HPI: the patient is a 59 yo woman with a h/o HTN, DM, chronic edema who was admitted with worsening peripheral edema and abdominal swelling. She has been treated with diuretic therapy, but has developed hypotension.  She has no chest discomfort. She has not had syncope. Denies cough. History of tobacco abuse in the past but quit many years ago. No history of COPD or PE.   PMH: Past Medical History  Diagnosis Date  . Depression   . GERD (gastroesophageal reflux disease)   . Hyperlipidemia   . Hypertension   . PVD (peripheral vascular disease)   . CAD (coronary artery disease)   . Familial hematuria   . NIDDM (non-insulin dependent diabetes mellitus)     with neuropathy  . IBS (irritable bowel syndrome)   . Esophageal stricture   . Obesity, unspecified   . Nephrolithiasis   . Allergy   . Arthritis     PSHX: Past Surgical History  Procedure Laterality Date  . Abdominal hysterectomy    . Cesarean section    . Carpal tunnel release      FAMHX: Family History  Problem Relation Age of Onset  . Diabetes Mother     Social History:  reports that she has quit smoking. She has never used smokeless tobacco. She reports that she does not drink alcohol or use illicit drugs.  Allergies: No Known Allergies  Medications: Reviewed   Imaging Results (Last 48 hours)    Dg Chest 2 View  12/03/2014 CLINICAL DATA: Cough and congestion with shortness of breath. Fluid retention with decreased renal function. EXAM: CHEST 2 VIEW COMPARISON: None. FINDINGS: Patient slightly rotated to the left. Lungs are adequately inflated with mild prominence of the perihilar markings with small bilateral pleural effusions. Mild cardiomegaly and mild prominence of the main pulmonary artery segment. Mild degenerative change of the spine. IMPRESSION: Constellation findings suggesting mild congestive heart failure. Electronically Signed  By: Marin Olp M.D. On: 12/03/2014 12:14   US Renal  12/03/2014 CLINICAL DATA: Diminished urine output. Acute kidney injury. Current history of diabetes and hypertension. EXAM: RENAL/URINARY TRACT ULTRASOUND COMPLETE COMPARISON: None. FINDINGS: Right Kidney: Length: Approximately 10.7 cm. Echogenic parenchyma. No hydronephrosis. 1.0 cm cyst arising from the lower pole. No significant focal parenchymal abnormality. Left Kidney: Length: Approximately 10.0 cm. Echogenic parenchyma. No hydronephrosis. No focal parenchymal abnormality. Bladder: Appears normal for degree of bladder distention. Other findings: Small amount of ascites in the right upper quadrant, left upper quadrant and in the left lower quadrant. Small bilateral pleural effusions. IMPRESSION: 1. No evidence of hydronephrosis involving either kidney. 2. Echogenic parenchyma involving both kidneys consistent with medical renal disease. 3. Small amount of ascites. 4. Small bilateral pleural effusions. Electronically Signed By: Evangeline Dakin M.D. On: 12/03/2014 13:55     ROS  As stated in the HPI and negative for all other systems.  Physical Exam  Vitals:Blood pressure 93/54, pulse 79, temperature 98.2 F (36.8 C), temperature source Oral, resp. rate 20, height _0  (1.549 m), weight 146 lb 2.6 oz (66.3 kg), SpO2 96 %.  Well appearing NAD HEENT: Unremarkable Neck: 7 cm JVD, no thyromegally Back: No CVA tenderness Lungs: right basilar rales. HEART: Regular rate rhythm, no murmurs, no rubs, no clicks Abd: soft, positive bowel sounds, no organomegally, no rebound, no guarding Ext: 2 plus pulses, 2+ edema, no cyanosis, no clubbing Skin: No rashes no nodules Neuro: CN II through XII intact, motor grossly intact  Normal sinus rhythm with nonspecific ST-T wave abnormality. I have  personally reviewed and interpreted this study.  Echo:Transthoracic Echocardiography  Patient:  Lindsay Harmon, Lindsay Harmon MR #:     [id number] Study Date: 12/04/2014 Gender:   F Age:    62 Height:   442 cm Weight:   66.4 kg BSA:    2.59 m^2 Pt. Status: Room:    5W23C  ATTENDING  Regis Bill [id number] Clayborn Bigness [id number] SONOGRAPHER Iverson Alamin, RCS PERFORMING  [facility], Inpatient  cc:  ------------------------------------------------------------------- LV EF: 60% -  65%  ------------------------------------------------------------------- Indications:   428.0 CHF.  ------------------------------------------------------------------- Study Conclusions  - Left ventricle: The cavity size was normal. Systolic function was normal. The estimated ejection fraction was in the range of 60% to 65%. Wall motion was normal; there were no regional wall motion abnormalities. There was an increased relative contribution of atrial contraction to ventricular filling. Doppler parameters are consistent with abnormal left ventricular relaxation (grade 1 diastolic dysfunction). - Aortic valve: Mild thickening and calcification, consistent with sclerosis. Valve area (Vmax): 1.25 cm^2. - Mitral valve: Calcified annulus. - Right ventricle: The cavity size was moderately dilated. Wall thickness was normal. Systolic function was moderately reduced. - Right atrium: The atrium was moderately dilated. - Tricuspid valve: There was mild-moderate regurgitation. - Pulmonary arteries: PA peak pressure: 71 mm Hg (S).  Impressions:  - The right ventricular systolic pressure was increased consistent with severe pulmonary hypertension.  Transthoracic echocardiography. M-mode, complete 2D, spectral Doppler, and color Doppler. Birthdate: Patient birthdate: 01/28/1956. Age: Patient is 59 yr old. Sex: Gender: female. BMI: 3.4 kg/m^2. Blood pressure:   86/62 Patient status: Inpatient. Study date: Study date:  12/04/2014. Study time: 02:01 PM.  -------------------------------------------------------------------  ------------------------------------------------------------------- Left ventricle: The cavity size was normal. Systolic function was normal. The estimated ejection fraction was in the range of 60% to 65%. Wall motion was normal; there were no regional wall motion abnormalities. There was an increased relative contribution of atrial contraction to ventricular filling. Doppler parameters are consistent with abnormal left ventricular relaxation (grade 1 diastolic dysfunction).  ------------------------------------------------------------------- Aortic valve:  Trileaflet. Mild thickening and calcification, consistent with sclerosis. Mobility was not restricted. Doppler: Transvalvular velocity was within the normal range. There was no stenosis. There was no regurgitation.  Valve area (Vmax): 1.25 cm^2. Indexed valve area (Vmax): 0.48 cm^2/m^2.  Peak gradient (S): 16 mm Hg.  ------------------------------------------------------------------- Aorta: Aortic root: The aortic root was normal in size.  ------------------------------------------------------------------- Mitral valve:  Calcified annulus. Mobility was not restricted. Doppler: Transvalvular velocity was within the normal range. There was no evidence for stenosis. There was no regurgitation.  Peak gradient (D): 2 mm Hg.  ------------------------------------------------------------------- Left atrium: The atrium was normal in size.  ------------------------------------------------------------------- Right ventricle: The cavity size was moderately dilated. Wall thickness was normal. Systolic function was moderately reduced.  ------------------------------------------------------------------- Pulmonic valve:  Structurally normal valve.  Cusp separation was normal. Doppler: Transvalvular velocity was  within the normal range. There was no evidence for stenosis. There was no regurgitation.  ------------------------------------------------------------------- Tricuspid valve:  Structurally normal valve.  Doppler: Transvalvular velocity was within the normal range. There was mild-moderate regurgitation.  ------------------------------------------------------------------- Pulmonary artery:  The main pulmonary artery was normal-sized. Systolic pressure was within the normal range.  ------------------------------------------------------------------- Right atrium: The atrium was moderately dilated.  ------------------------------------------------------------------- Pericardium: There was no pericardial effusion.  ------------------------------------------------------------------- Systemic veins: Inferior vena cava: The vessel was dilated. The respirophasic diameter changes were blunted (< 50%), consistent with elevated central venous pressure.  -------------------------------------------------------------------  Measurements  Left ventricle              Value     Reference LV ID, ED, PLAX chordal      (L)   34.2 mm    43 - 52 LV ID, ES, PLAX chordal      (L)   18.6 mm    23 - 38 LV fx shortening, PLAX chordal      46  %    >=29 LV PW thickness, ED            10.3 mm    --------- IVS/LV PW ratio, ED            1.15      <=1.3 LV e&', lateral              10.1 cm/s   --------- LV E/e&', lateral             7.71      --------- LV e&', medial               7.9  cm/s   --------- LV E/e&', medial              9.86      --------- LV e&', average              9   cm/s   --------- LV E/e&', average             8.66      ---------  Ventricular septum            Value      Reference IVS thickness, ED             11.8 mm    ---------  LVOT                   Value     Reference LVOT ID, S                16  mm    --------- LVOT area                 2.01 cm^2   --------- LVOT peak velocity, S           123  cm/s   --------- LVOT peak gradient, S           6   mm Hg  ---------  Aortic valve               Value     Reference Aortic peak gradient, S          16  mm Hg  --------- Aortic valve area, peak velocity     1.25 cm^2   --------- Aortic valve area/bsa, peak        0.48 cm^2/m^2 --------- velocity  Aorta                   Value     Reference Aortic root ID, ED            25  mm    ---------  Left atrium                Value     Reference LA ID, A-P, ES              43  mm    --------- LA ID/bsa, A-P              1.66  cm/m^2  <=2.2 LA volume, S               47  ml    --------- LA volume/bsa, S             18.1 ml/m^2  --------- LA volume, ES, 1-p A4C          39  ml    --------- LA volume/bsa, ES, 1-p A4C        15.1 ml/m^2  --------- LA volume, ES, 1-p A2C          55  ml    --------- LA volume/bsa, ES, 1-p A2C        21.2 ml/m^2  ---------  Mitral valve               Value     Reference Mitral E-wave peak velocity        77.9 cm/s   --------- Mitral A-wave peak velocity        95.6 cm/s   --------- Mitral deceleration time         187  ms    150 - 230 Mitral peak gradient, D          2   mm Hg  --------- Mitral E/A ratio, peak          0.8      ---------  Pulmonary arteries             Value     Reference PA pressure, S, DP        (H)   71  mm Hg  <=30  Tricuspid valve              Value     Reference Tricuspid regurg peak velocity      397  cm/s   --------- Tricuspid peak RV-RA gradient       63  mm Hg  ---------  Right ventricle              Value     Reference RV s&', lateral, S             6.9  cm/s   ---------  Legend: (L) and (H) mark values outside specified reference range.  ------------------------------------------------------------------- Prepared and Electronically Authenticated by  Fransico Him, MD 2016-02-07T16:51:29  Assessment/Plan: 1. Acute on chronic right heart failure with Echo evidence of severe pulmonary HTN. Etiology of pulmonary HTN is unclear. Recommend right heart cath today to assess pulmonary and left sided filling pressures. Need to consider PE workup. Not a candidate for contrast studies so may consider V/Q scan/ LE venous dopplers. ? High resolution chest CT without contrast. May need to consider pulmonary evaluation.  2. Stage V renal failure 3. Troponin elevation with flat trajectory. Most likely demand ischemia from right heart failure.  4. Hypertension now hypotension  Peter Martinique MD, [facility]  12/05/2014 8:01 AM

## 2014-12-05 NOTE — Progress Notes (Signed)
INITIAL NUTRITION ASSESSMENT  DOCUMENTATION CODES Per approved criteria  -Not Applicable   INTERVENTION: Continue Ensure Complete po BID once diet advanced, each supplement provides 350 kcal and 13 grams of protein  NUTRITION DIAGNOSIS: Inadequate oral intake related to inability to eat as evidenced by NPO status  Goal: Pt to meet >/= 90% of their estimated nutrition needs   Monitor:  Diet advancement, PO intake, supplement acceptance  Reason for Assessment: Pt identified as at nutrition risk on the Malnutrition Screen Tool  59 y.o. female  Admitting Dx: ARF (acute renal failure)  ASSESSMENT: 59 year old female with a history of hypertension, diabetes, hyperlipidemia, CKD stage III presents with 10 days of increasing lower extremity edema and abdominal girth.  Per pt she has lost a lot of weight. She reports a usual weight of 180 lb only 6-8 mo ago. A review of her weight hx shows she is usually 130-150 lb. She is currently 146 lb but does have some remaining ascites.  She reports that her weight loss happened despite of her good appetite.  No signs of fat or muscle depletion noted.    Height: Ht Readings from Last 1 Encounters:  12/03/14 _0  (1.549 m)    Weight: Wt Readings from Last 1 Encounters:  12/04/14 146 lb 2.6 oz (66.3 kg)    Ideal Body Weight: 47.7 kg   % Ideal Body Weight: 139%  Wt Readings from Last 10 Encounters:  12/04/14 146 lb 2.6 oz (66.3 kg)  12/01/14 141 lb 3.2 oz (64.048 kg)  11/08/14 141 lb (63.957 kg)  03/15/14 133 lb (60.328 kg)  09/15/13 135 lb (61.236 kg)  07/12/13 137 lb (62.143 kg)  03/08/13 140 lb (63.504 kg)  09/08/12 149 lb (67.586 kg)  07/07/12 148 lb (67.132 kg)  04/28/12 145 lb (65.772 kg)    Usual Body Weight: 130-150 lb   % Usual Body Weight: 100%  BMI:  Body mass index is 27.63 kg/(m^2).  Estimated Nutritional Needs: Kcal: 1600-1800 Protein: 80-100 grams Fluid: > 1.6 L/day  Skin: intact  Diet Order: Diet NPO  time specified  EDUCATION NEEDS: -No education needs identified at this time   Intake/Output Summary (Last 24 hours) at 12/05/14 1455 Last data filed at 12/05/14 1451  Gross per 24 hour  Intake 910.83 ml  Output   2870 ml  Net -1959.17 ml    Last BM: PTA   Labs:   Recent Labs Lab 12/03/14 1150 12/04/14 0605 12/05/14 0800  NA 139 137 140  K 4.1 3.8 3.9  CL 97 98 99  CO2 31 32 30  BUN 41* 43* 36*  CREATININE 2.84* 2.74* 2.20*  CALCIUM 7.1* 6.8* 7.7*  MG  --   --  1.8  GLUCOSE 81 134* 114*    CBG (last 3)   Recent Labs  12/04/14 1209 12/04/14 1657 12/04/14 2215  GLUCAP 88 168* 95    Scheduled Meds: . ALPRAZolam  0.5 mg Oral BID  . aspirin  325 mg Oral Daily  . feeding supplement (ENSURE COMPLETE)  237 mL Oral BID BM  . gabapentin  200 mg Oral BID  . heparin  5,000 Units Subcutaneous 3 times per day  . insulin aspart  0-5 Units Subcutaneous QHS  . insulin aspart  0-9 Units Subcutaneous TID WC  . methadone  10 mg Oral Q12H  . mirtazapine  15 mg Oral QHS  . pantoprazole  40 mg Oral BID  . pravastatin  40 mg Oral q1800  . sodium chloride  3 mL Intravenous Q12H  . sodium chloride  3 mL Intravenous Q12H    Continuous Infusions: . sodium chloride 10 mL (12/03/14 1543)    Past Medical History  Diagnosis Date  . Depression   . GERD (gastroesophageal reflux disease)   . Hyperlipidemia   . Hypertension   . PVD (peripheral vascular disease)   . CAD (coronary artery disease)   . Familial hematuria   . NIDDM (non-insulin dependent diabetes mellitus)     with neuropathy  . IBS (irritable bowel syndrome)   . Esophageal stricture   . Obesity, unspecified   . Nephrolithiasis   . Allergy   . Arthritis     Past Surgical History  Procedure Laterality Date  . Abdominal hysterectomy    . Cesarean section    . Carpal tunnel release      [place], LDN, CNSC [phone] Pager [phone] After Hours Pager

## 2014-12-05 NOTE — Telephone Encounter (Signed)
Pt was seen 12/03/14 at [facility] ED and pt was admitted.

## 2014-12-05 NOTE — Progress Notes (Signed)
PROGRESS NOTE  Lindsay Harmon OHY:[id number] DOB: 15-Aug-1956 DOA: 12/03/2014 PCP: Viviana Simpler, MD  Brief history 59 year old female with a history of hypertension, diabetes, hyperlipidemia, CKD stage III presents with 10 days of increasing lower extremity edema and abdominal girth. Remarkably, the patient denied any chest discomfort or any shortness of breath. She denies any fevers, chills, coughing, nausea, vomiting, diarrhea. The patient clarified her torsemide dose which she states she takes 20 mg tablets, 2 tablets twice a day. She has been on this for at least 5 years. She went to see her primary care provider on 12/01/2014. On her labs returned, she was instructed to stop her glipizide and metformin. The patient states that she also stopped her torsemide on the day prior to admission. The patient has over 50-pack-year tobacco history, but has quit smoking cigarettes for several years. She previously drink heavily for a period of 4 years when she was in her 87s, but has not had any alcohol since then. The patient states that she sleeps in a recliner but this is for her back pain, not specifically for any breathing issues Assessment/Plan: Severe pulmonary HTN/R-heart Failure -Presenting as Lower extremity edema and increasing abdominal girth -12/04/2014 echocardiogram--EF 94-85%, grade 1 diastolic dysfunction, PAP 71, moderate RV dilatation -Considerations include CHF, liver cirrhosis -Abdominal ultrasound to evaluate liver architecture as well as for splenomegaly and ascites -appreciate cardiology followup--> right heart catheterization planned 12/05/2014 -V/Q scan as recommended by cardiology -Renal ultrasound was negative for hydronephrosis but showed small bilateral pleural effusions and a small amount of ascites -Urine protein/creatinine ratio--0.63 -The patient did receive furosemide 40 mg IV 1 on 12/03/2014--lasix on hold presently due to hypotension/soft BP Acute on chronic  renal failure (CKD 3) -Renal ultrasound negative for hydronephrosis -Serum creatinine actually showed mild improvement with furosemide IV -Nephrology consultation of worsens Hypotension -pt appears clinically fluid overloaded  -appreciate cardiology -hold lasix for right not as pt is not having any dyspnea -d/c metoprolol Hypoxemia -combination of pleural effusion and undiagnosed COPD -supplemental oxygen -stable on 2L without distress Elevated troponin in setting of renal failure -doubt ACS -no CP -cycle troponins--flat trend -likely demand ischemia Transaminasemia -abd [place] as discussed -may be hepatic congestion from fluid overload Chronic pain -continue home dose methadone and Gabapentin Diabetes mellitus type 2 -11/08/14 hemoglobin A1c 8.1 -NovoLog sliding scale -Patient developed hypoglycemia in the setting of worsening renal failure and Amaryl -Discontinue Amaryl and metformin -CBGs controlled Family Communication: Son updated at beside 2/7 Disposition Plan: Home when medically stable    Procedures/Studies: Dg Chest 2 View  12/03/2014   CLINICAL DATA:  Cough and congestion with shortness of breath. Fluid retention with decreased renal function.  EXAM: CHEST  2 VIEW  COMPARISON:  None.  FINDINGS: Patient slightly rotated to the left. Lungs are adequately inflated with mild prominence of the perihilar markings with small bilateral pleural effusions. Mild cardiomegaly and mild prominence of the main pulmonary artery segment. Mild degenerative change of the spine.  IMPRESSION: Constellation findings suggesting mild congestive heart failure.   Electronically Signed   By: Marin Olp M.D.   On: 12/03/2014 12:14   US Renal  12/03/2014   CLINICAL DATA:  Diminished urine output. Acute kidney injury. Current history of diabetes and hypertension.  EXAM: RENAL/URINARY TRACT ULTRASOUND COMPLETE  COMPARISON:  None.  FINDINGS: Right Kidney:  Length: Approximately 10.7 cm.  Echogenic parenchyma. No hydronephrosis. 1.0 cm cyst arising from the lower pole. No  significant focal parenchymal abnormality.  Left Kidney:  Length: Approximately 10.0 cm. Echogenic parenchyma. No hydronephrosis. No focal parenchymal abnormality.  Bladder:  Appears normal for degree of bladder distention.  Other findings:  Small amount of ascites in the right upper quadrant, left upper quadrant and in the left lower quadrant. Small bilateral pleural effusions.  IMPRESSION: 1. No evidence of hydronephrosis involving either kidney. 2. Echogenic parenchyma involving both kidneys consistent with medical renal disease. 3. Small amount of ascites. 4. Small bilateral pleural effusions.   Electronically Signed   By: Evangeline Dakin M.D.   On: 12/03/2014 13:55         Subjective: Patient is breathing better. Denies any fevers, chills, chest pain, nausea, vomiting, diarrhea, abdominal pain, dysuria. Has some dyspnea on exertion.  Objective: Filed Vitals:   12/04/14 0525 12/04/14 1503 12/04/14 2218 12/05/14 0424  BP: 86/62 93/54 102/54 126/72  Pulse: 78 79 78 79  Temp: 98.4 F (36.9 C) 98.2 F (36.8 C) 97.7 F (36.5 C) 97.9 F (36.6 C)  TempSrc: Oral Oral Oral Oral  Resp: _0 Height:      Weight: 66.3 kg (146 lb 2.6 oz)     SpO2: 92% 96% 97% 92%    Intake/Output Summary (Last 24 hours) at 12/05/14 0924 Last data filed at 12/05/14 0912  Gross per 24 hour  Intake 1147.83 ml  Output   1870 ml  Net -722.17 ml   Weight change:  Exam:   General:  Pt is alert, follows commands appropriately, not in acute distress  HEENT: No icterus, No thrush,[place]/AT  Cardiovascular: RRR, S1/S2, no rubs, no gallops  Respiratory: Bibasilar crackles. No wheezing. Good air movement  Abdomen: Soft/+BS, non tender, non distended, no guarding  Extremities: 2+ edema, No lymphangitis, No petechiae, No rashes, no synovitis  Data Reviewed: Basic Metabolic Panel:  Recent Labs Lab 12/01/14 1635  12/03/14 1150 12/04/14 0605  NA 139 139 137  K 3.9 4.1 3.8  CL 97 97 98  CO2 32 31 32  GLUCOSE 40* 81 134*  BUN 40* 41* 43*  CREATININE 2.26* 2.84* 2.74*  CALCIUM 8.1* 7.1* 6.8*   Liver Function Tests:  Recent Labs Lab 12/01/14 1635 12/03/14 1150  AST 24 69*  ALT 40* 51*  ALKPHOS 117 115  BILITOT 0.8 0.8  PROT 6.6 6.2  ALBUMIN 3.4* 2.9*   No results for input(s): LIPASE, AMYLASE in the last 168 hours. No results for input(s): AMMONIA in the last 168 hours. CBC:  Recent Labs Lab 12/01/14 1635 12/03/14 1150 12/04/14 0605  WBC 11.1* 9.7 10.4  NEUTROABS 7.6 6.4  --   HGB 14.9 14.6 13.3  HCT 46.7* 45.7 42.7  MCV 77.7* 77.9* 76.9*  PLT 277.0 254 246   Cardiac Enzymes:  Recent Labs Lab 12/03/14 1443 12/04/14 1103 12/04/14 1640  TROPONINI 0.15* 0.11* 0.11*   BNP: Invalid input(s): POCBNP CBG:  Recent Labs Lab 12/03/14 2158 12/04/14 0813 12/04/14 1209 12/04/14 1657 12/04/14 2215  GLUCAP 175* 132* 88 168* 95    No results found for this or any previous visit (from the past 240 hour(s)).   Scheduled Meds: . ALPRAZolam  0.5 mg Oral BID  . aspirin  325 mg Oral Daily  . feeding supplement (ENSURE COMPLETE)  237 mL Oral BID BM  . gabapentin  200 mg Oral BID  . heparin  5,000 Units Subcutaneous 3 times per day  . insulin aspart  0-5 Units Subcutaneous QHS  . insulin aspart  0-9 Units Subcutaneous TID WC  . methadone  10 mg Oral Q12H  . mirtazapine  15 mg Oral QHS  . pantoprazole  40 mg Oral BID  . pravastatin  40 mg Oral q1800  . sodium chloride  3 mL Intravenous Q12H  . sodium chloride  3 mL Intravenous Q12H   Continuous Infusions: . sodium chloride 10 mL (12/03/14 1543)     Sheronda Parran, DO  [facility] Pager [phone]  If 7PM-7AM, please contact night-coverage [email or web address] Password [email or web address] 12/05/2014, 9:24 AM   LOS: 2 days

## 2014-12-05 NOTE — Interval H&P Note (Signed)
History and Physical Interval Note:  12/05/2014 7:04 PM  Lindsay Harmon  has presented today for surgery, with the diagnosis of cp  The various methods of treatment have been discussed with the patient and family. After consideration of risks, benefits and other options for treatment, the patient has consented to  Procedure(s): RIGHT HEART CATH (N/A) as a surgical intervention .  The patient's history has been reviewed, patient examined, no change in status, stable for surgery.  I have reviewed the patient's chart and labs.  Questions were answered to the patient's satisfaction.     Sinclair Grooms

## 2014-12-05 NOTE — CV Procedure (Signed)
Right Heart Catheterization Report  NOZOMI METTLER  59 y.o.  female August 16, 1956  Procedure Date: 12/05/2014 Referring Physician: Dr. Martinique Primary Cardiologist:: Dr. Martinique  INDICATIONS: Right heart enlargement and pulmonary hypertension  PROCEDURE: 1. Right heart catheterization 2. Oximetry run  CONSENT:  The risks, benefits, and details of the procedure were explained in detail to the patient. Risks including death, stroke, heart attack, kidney injury, allergy, limb ischemia, bleeding and radiation injury were discussed.  The patient verbalized understanding and wanted to proceed.  Informed written consent was obtained.  PROCEDURE TECHNIQUE:  After Xylocaine anesthesia a 5 French brachial sheath was placed in the right antecubital vein in exchange for an 18-gauge Angiocath using the Seldinger technique..  Pressures and oximetry data were obtained in the right atrium, right ventricle, pulmonary artery, and pulmonary capillary wedge positions.  After right heart pressures were recorded the case was terminated and the sheath removed from the antecubital with hemostasis achieved by manual compression.    COMPLICATIONS:  None   HEMODYNAMICS:  Aortic pressure not recorded, with O2 saturation 93%; LV pressure not recorded; LVEDP not recorded; RA 12 mmHg (mean) with O2 sat 72%; RV 97/16 mmHg with O2 sat 73%; PA 97/30 mmHg with O2 sat 67%; PCWP(mean) 14 mmHg (mean); Cardiac Output 5.78 L/min   IMPRESSIONS:  1. Severe pulmonary artery hypertension of undefined etiology 2. No evidence for left-to-right shunting 3. Normal left ventricular filling pressures   RECOMMENDATION:  Per treating team.

## 2014-12-05 NOTE — Telephone Encounter (Signed)
Yes, I already spoke to her in her room

## 2014-12-05 NOTE — Telephone Encounter (Signed)
PLEASE NOTE: All timestamps contained within this report are represented as [place] Standard Time. CONFIDENTIALTY NOTICE: This fax transmission is intended only for the addressee. It contains information that is legally privileged, confidential or otherwise protected from use or disclosure. If you are not the intended recipient, you are strictly prohibited from reviewing, disclosing, copying using or disseminating any of this information or taking any action in reliance on or regarding this information. If you have received this fax in error, please notify us immediately by telephone so that we can arrange for its return to [place]. Phone: [phone], Toll-Free: [phone], Fax: [phone] Page: 1 of 2 Call Id: [id number] [place] Patient Name: Lindsay Harmon Gender: Female DOB: 1956/01/14 Age: 59 Y Return Phone Number: [phone] (Primary), [phone] (Alternate) Address: [phone] City/State/Zip: [place] [address] Client [place] Night - Client Client Site [place] Physician Viviana Simpler Contact Type Call Call Type Triage / Clinical Caller Name Marian Sorrow Relationship To Patient Mother Return Phone Number [phone] (Primary) Chief Complaint URINATE - sudden inability to urinate Initial Comment Caller states, dtr was seen yesterday, she has decreased kidney functions, unable to urinate PreDisposition Go to ED Nurse Assessment Nurse: Wynetta Emery, RN, Baker Janus Date/Time Eilene Ghazi Time): 12/03/2014 9:37:11 AM Confirm and document reason for call. If symptomatic, describe symptoms. ---Lindsay Harmon seen on Thursday in MD office; she is retaining fluid legs are swelling and she is unable to make urine today and last night -- renal function has decreased since last month per MD call. Has the patient traveled out of the  country within the last 30 days? ---No Does the patient require triage? ---Yes Related visit to physician within the last 2 weeks? ---Yes MD office Thursday -- Renal function had gotten worse Does the PT have any chronic conditions? (i.e. diabetes, asthma, etc.) ---Yes List chronic conditions. ---Diabetes Kidney function Guidelines Guideline Title Affirmed Question Affirmed Notes Nurse Date/Time (Eastern Time) Urinary Symptoms [1] Unable to urinate (or only a few drops) > 4 hours AND [2] bladder feels very full (e.g., palpable bladder or strong urge to urinate) Wynetta Emery, RN, Baker Janus 12/03/2014 9:40:07 AM Disp. Time Eilene Ghazi Time) Disposition Final User 12/03/2014 9:33:24 AM Send to Urgent Queue Eather Colas 12/03/2014 9:41:24 AM Go to ED Now Yes Wynetta Emery, RN, Juleen China NOTE: All timestamps contained within this report are represented as [place] Standard Time. CONFIDENTIALTY NOTICE: This fax transmission is intended only for the addressee. It contains information that is legally privileged, confidential or otherwise protected from use or disclosure. If you are not the intended recipient, you are strictly prohibited from reviewing, disclosing, copying using or disseminating any of this information or taking any action in reliance on or regarding this information. If you have received this fax in error, please notify us immediately by telephone so that we can arrange for its return to [place]. Phone: [phone], Toll-Free: [phone], Fax: [phone] Page: 2 of 2 Call Id: [id number] Caller Understands: Yes Disagree/Comply: Comply Care Advice Given Per Guideline GO TO ED NOW: You need to be seen in the Emergency Department. Go to the ER at ___________ [place] now. Drive carefully. CARE ADVICE given per Urinary Symptoms (Adult) guideline. After Care Instructions Given Call Event Type User Date / Time Description Referrals [facility] - ED

## 2014-12-05 NOTE — Progress Notes (Signed)
Pt. Went down for cardiac cath. Removed tele box and informed central tele of changes. Consent has been signed. CHG bath, surgical swab and right groin clipping has been done. Pt. Has been NPO. No further needs noted at this time.

## 2014-12-06 ENCOUNTER — Inpatient Hospital Stay (HOSPITAL_COMMUNITY): Payer: Commercial Managed Care - HMO

## 2014-12-06 ENCOUNTER — Encounter (HOSPITAL_COMMUNITY): Payer: Self-pay | Admitting: Interventional Cardiology

## 2014-12-06 DIAGNOSIS — N179 Acute kidney failure, unspecified: Secondary | ICD-10-CM

## 2014-12-06 DIAGNOSIS — I5023 Acute on chronic systolic (congestive) heart failure: Secondary | ICD-10-CM

## 2014-12-06 LAB — GLUCOSE, CAPILLARY
GLUCOSE-CAPILLARY: 113 mg/dL — AB (ref 70–99)
GLUCOSE-CAPILLARY: 59 mg/dL — AB (ref 70–99)
GLUCOSE-CAPILLARY: 72 mg/dL (ref 70–99)
Glucose-Capillary: 85 mg/dL (ref 70–99)
Glucose-Capillary: 142 mg/dL — ABNORMAL HIGH (ref 70–99)
Glucose-Capillary: 236 mg/dL — ABNORMAL HIGH (ref 70–99)
Glucose-Capillary: 211 mg/dL — ABNORMAL HIGH (ref 70–99)
Glucose-Capillary: 297 mg/dL — ABNORMAL HIGH (ref 70–99)
Glucose-Capillary: 196 mg/dL — ABNORMAL HIGH (ref 70–99)

## 2014-12-06 LAB — PULMONARY FUNCTION TEST
FEF 25-75 POST: 0.49 L/s
FEF 25-75 PRE: 0.41 L/s
FEF2575-%Change-Post: 17 %
FEF2575-%Pred-Post: 22 %
FEF2575-%Pred-Pre: 19 %
FEV1-%Change-Post: 7 %
FEV1-%PRED-PRE: 38 %
FEV1-%Pred-Post: 41 %
FEV1-PRE: 0.87 L
FEV1-Post: 0.93 L
FEV1FVC-%Change-Post: 8 %
FEV1FVC-%PRED-PRE: 79 %
FEV6-%Change-Post: 0 %
FEV6-%PRED-POST: 49 %
FEV6-%Pred-Pre: 49 %
FEV6-Post: 1.39 L
FEV6-Pre: 1.4 L
FEV6FVC-%CHANGE-POST: 0 %
FEV6FVC-%Pred-Post: 104 %
FEV6FVC-%Pred-Pre: 103 %
FVC-%Change-Post: -1 %
FVC-%PRED-PRE: 48 %
FVC-%Pred-Post: 47 %
FVC-POST: 1.4 L
FVC-Pre: 1.41 L
PRE FEV1/FVC RATIO: 62 %
Post FEV1/FVC ratio: 67 %
Post FEV6/FVC ratio: 100 %
Pre FEV6/FVC Ratio: 100 %
RV % pred: 114 %
RV: 2.12 L
TLC % pred: 78 %
TLC: 3.62 L

## 2014-12-06 LAB — URINE CULTURE
COLONY COUNT: NO GROWTH
CULTURE: NO GROWTH

## 2014-12-06 LAB — BLOOD GAS, ARTERIAL
ACID-BASE EXCESS: 5.1 mmol/L — AB (ref 0.0–2.0)
BICARBONATE: 30.4 meq/L — AB (ref 20.0–24.0)
Drawn by: 28098
O2 Content: 4 L/min
O2 Saturation: 85.8 %
Patient temperature: 98.6
TCO2: 32 mmol/L (ref 0–100)
pCO2 arterial: 55.4 mmHg — ABNORMAL HIGH (ref 35.0–45.0)
pH, Arterial: 7.358 (ref 7.350–7.450)
pO2, Arterial: 55.8 mmHg — ABNORMAL LOW (ref 80.0–100.0)

## 2014-12-06 LAB — BASIC METABOLIC PANEL
Anion gap: 8 (ref 5–15)
BUN: 25 mg/dL — ABNORMAL HIGH (ref 6–23)
CALCIUM: 8.1 mg/dL — AB (ref 8.4–10.5)
CHLORIDE: 103 mmol/L (ref 96–112)
CO2: 26 mmol/L (ref 19–32)
Creatinine, Ser: 1.67 mg/dL — ABNORMAL HIGH (ref 0.50–1.10)
GFR calc Af Amer: 37 mL/min — ABNORMAL LOW (ref >90)
GFR calc non Af Amer: 32 mL/min — ABNORMAL LOW (ref >90)
GLUCOSE: 185 mg/dL — AB (ref 70–99)
Potassium: 4.3 mmol/L (ref 3.5–5.1)
Sodium: 137 mmol/L (ref 135–145)

## 2014-12-06 LAB — MAGNESIUM: Magnesium: 1.6 mg/dL (ref 1.5–2.5)

## 2014-12-06 MED ORDER — ALBUTEROL SULFATE (2.5 MG/3ML) 0.083% IN NEBU
2.5000 mg | INHALATION_SOLUTION | Freq: Once | RESPIRATORY_TRACT | Status: AC
  Administered 2014-12-06: 2.5 mg via RESPIRATORY_TRACT

## 2014-12-06 MED ORDER — METOPROLOL TARTRATE 12.5 MG HALF TABLET
12.5000 mg | ORAL_TABLET | Freq: Two times a day (BID) | ORAL | Status: DC
  Administered 2014-12-06 – 2014-12-07 (×3): 12.5 mg via ORAL
  Filled 2014-12-06 (×4): qty 1

## 2014-12-06 MED ORDER — TORSEMIDE 20 MG PO TABS
20.0000 mg | ORAL_TABLET | Freq: Two times a day (BID) | ORAL | Status: DC
  Administered 2014-12-06 – 2014-12-07 (×2): 20 mg via ORAL
  Filled 2014-12-06 (×4): qty 1

## 2014-12-06 NOTE — Progress Notes (Signed)
PROGRESS NOTE  Lindsay Harmon QAS:[id number] DOB: 03-May-1956 DOA: 12/03/2014 PCP: Viviana Simpler, MD Brief history 59 year old female with a history of hypertension, diabetes, hyperlipidemia, CKD stage III presents with 10 days of increasing lower extremity edema and abdominal girth. Remarkably, the patient denied any chest discomfort or any shortness of breath. She denies any fevers, chills, coughing, nausea, vomiting, diarrhea. The patient clarified her torsemide dose which she states she takes 20 mg tablets, 2 tablets twice a day. She has been on this for at least 5 years. She went to see her primary care provider on 12/01/2014. On her labs returned, she was instructed to stop her glipizide and metformin. The patient states that she also stopped her torsemide on the day prior to admission. The patient has over 50-pack-year tobacco history, but has quit smoking cigarettes for several years. She previously drink heavily for a period of 4 years when she was in her 18s, but has not had any alcohol since then. The patient states that she sleeps in a recliner but this is for her back pain, not specifically for any breathing issues. The patient was given one dose of intravenous furosemide. The patient's became very soft which limited diuresis. As a result, cardiology was consulted. Right heart catheterization was performed on 12/05/2014 which revealed severe pulmonary hypertension. Assessment/Plan: Severe pulmonary HTN/R-heart Failure/Cor Pulmonale -Presenting as Lower extremity edema and increasing abdominal girth -12/04/2014 echocardiogram--EF 79-89%, grade 1 diastolic dysfunction, PAP 71, moderate RV dilatation -Abdominal ultrasound to evaluate liver architecture as well as for splenomegaly and ascites-->suggested cirrhosis with small amt ascites -pt likely has had chronic hepatic congestion from pulmonary HTN/Cor Pulmonale resulting in cirrhosis -appreciate cardiology followup--> right heart  catheterization planned 12/05/2014-->severe PAH with normal PCWP and normal output -V/Q scan as recommended by cardiology--neg for PE -Renal ultrasound was negative for hydronephrosis but showed small bilateral pleural effusions and a small amount of ascites -Urine protein/creatinine ratio--0.63 -The patient did receive furosemide 40 mg IV 1 on 12/03/2014--lasix on hold presently due to hypotension/soft BP -Patient will go home on torsemide 20 mg twice a day -she will need pulmonary rehab  -12/06/14 consult pulmonary Acute on chronic renal failure (CKD 3) -Renal ultrasound negative for hydronephrosis -Serum creatinine actually showed mild improvement with furosemide IV initially -renal function improved to 1.67 on 12/06/14 Hypotension -improving after holding lasix -pt appears clinically fluid overloaded  -appreciate cardiology -R-heart cath showed severe pulm HTN  Acute respiratory failure with hypoxia -combination of pleural effusion and undiagnosed COPD and pulm HTN -supplemental oxygen -stable on 4L without distress -Patient had oxygen desaturation with ambulation. The patient was set up with home oxygen with the assistance of case management  Elevated troponin in setting of renal failure -doubt ACS -no CP -cycle troponins--flat trend -likely demand ischemia Transaminasemia -abd US--suggested liver cirrhosis with small myositis -hepatic congestion from fluid overload Chronic pain -continue home dose methadone and Gabapentin Diabetes mellitus type 2 -11/08/14 hemoglobin A1c 8.1 -NovoLog sliding scale -Patient developed hypoglycemia in the setting of worsening renal failure and Amaryl -Discontinue Amaryl and metformin -CBGs controlled -pt will go home with glipizide 5 mg bid (less effect from renal failure)--> she will follow up with her primary care provider who will make further adjustments Family Communication: mother updated at beside 2/9 Disposition Plan:   Home 1-2  days       Procedures/Studies: Dg Chest 2 View  12/03/2014   CLINICAL DATA:  Cough and congestion with  shortness of breath. Fluid retention with decreased renal function.  EXAM: CHEST  2 VIEW  COMPARISON:  None.  FINDINGS: Patient slightly rotated to the left. Lungs are adequately inflated with mild prominence of the perihilar markings with small bilateral pleural effusions. Mild cardiomegaly and mild prominence of the main pulmonary artery segment. Mild degenerative change of the spine.  IMPRESSION: Constellation findings suggesting mild congestive heart failure.   Electronically Signed   By: Marin Olp M.D.   On: 12/03/2014 12:14   US Abdomen Complete  12/05/2014   CLINICAL DATA:  Initial encounter for hepatic insufficiency.  EXAM: ULTRASOUND ABDOMEN COMPLETE  COMPARISON:  Renal ultrasound from 12/03/2014.  FINDINGS: Gallbladder: No evidence for gallstones. Gallbladder wall is mildly thickened at 3-4 mm. No pericholecystic fluid. The sonographer reports no sonographic Murphy sign.  Common bile duct: Diameter: Dilated 8 mm diameter.  Liver: No substantial intrahepatic biliary duct dilatation. No focal intrahepatic mass lesion. Liver contour appears slightly nodular. Left hepatic lobe is prominent.  IVC: No abnormality visualized.  Pancreas: Visualized portion unremarkable.  Spleen: Size and appearance within normal limits.  Right Kidney: Length: 11.8 cm. Renal parenchyma shows diffusely increased echogenicity. No hydronephrosis.  Left Kidney: Length: 11.0 cm. Increased echogenicity. No hydronephrosis.  Abdominal aorta: Atherosclerotic wall irregularity is evident.  Other findings: Small bilateral pleural effusions are evident. There is a small amount of fluid around the liver and in the right paracolic gutter.  IMPRESSION: Subtle nodularity of the liver contour raises the question of cirrhosis. No intrahepatic focal mass lesion.  Borderline gallbladder wall thickening without gallstones or  pericholecystic fluid. Extrahepatic common duct is mildly distended although no associated intrahepatic biliary duct dilatation is evident. Gallbladder wall thickening may be secondary to hypoproteinemia or systemic disease. If there is clinical concern for cholecystitis, nuclear scintigraphy may prove helpful to further evaluate.  Small pleural effusions with small volume ascites.  Echogenic kidneys compatible with medical renal disease.  Atherosclerosis.   Electronically Signed   By: Misty Stanley M.D.   On: 12/05/2014 12:23   US Renal  12/03/2014   CLINICAL DATA:  Diminished urine output. Acute kidney injury. Current history of diabetes and hypertension.  EXAM: RENAL/URINARY TRACT ULTRASOUND COMPLETE  COMPARISON:  None.  FINDINGS: Right Kidney:  Length: Approximately 10.7 cm. Echogenic parenchyma. No hydronephrosis. 1.0 cm cyst arising from the lower pole. No significant focal parenchymal abnormality.  Left Kidney:  Length: Approximately 10.0 cm. Echogenic parenchyma. No hydronephrosis. No focal parenchymal abnormality.  Bladder:  Appears normal for degree of bladder distention.  Other findings:  Small amount of ascites in the right upper quadrant, left upper quadrant and in the left lower quadrant. Small bilateral pleural effusions.  IMPRESSION: 1. No evidence of hydronephrosis involving either kidney. 2. Echogenic parenchyma involving both kidneys consistent with medical renal disease. 3. Small amount of ascites. 4. Small bilateral pleural effusions.   Electronically Signed   By: Evangeline Dakin M.D.   On: 12/03/2014 13:55   Nm Pulmonary Perf And Vent  12/05/2014   CLINICAL DATA:  Shortness of breath and pulmonary hypertension  EXAM: NUCLEAR MEDICINE VENTILATION - PERFUSION LUNG SCAN  TECHNIQUE: Ventilation images were obtained in multiple projections using inhaled aerosol technetium 99 M DTPA. Perfusion images were obtained in multiple projections after intravenous injection of Tc-29mMAA.   RADIOPHARMACEUTICALS:  Forty mCi Tc-948mTPA aerosol and 6 mCi Tc-9978mA  COMPARISON:  None.  FINDINGS: Ventilation: No focal ventilation defect. Some free technetium is noted within the stomach as  well as trapping centrally in the left the lung.  Perfusion: No wedge shaped peripheral perfusion defects to suggest acute pulmonary embolism.  IMPRESSION: No evidence of pulmonary embolism.   Electronically Signed   By: [name] M.D.   On: 12/05/2014 16:36         Subjective:  patient has dyspnea with exertion. Denies any chest pain, nausea, vomiting, diarrhea, abdominal pain. No fevers or chills.   Objective: Filed Vitals:   12/05/14 1959 12/05/14 2121 12/06/14 0601 12/06/14 1508  BP: 122/51 118/58 133/71 118/54  Pulse: 101 100 107 113  Temp: 98.5 F (36.9 C) 98.4 F (36.9 C) 98.7 F (37.1 C) 97.8 F (36.6 C)  TempSrc: Oral Oral Oral Oral  Resp: _0 Height:      Weight:      SpO2: 93% 92% 92% 93%    Intake/Output Summary (Last 24 hours) at 12/06/14 1948 Last data filed at 12/06/14 1651  Gross per 24 hour  Intake    537 ml  Output   2100 ml  Net  -1563 ml   Weight change:  Exam:   General:  Pt is alert, follows commands appropriately, not in acute distress  HEENT: No icterus, No thrush,[place]/AT  Cardiovascular: RRR, S1/S2, no rubs, no gallops  Respiratory: bibasilar crackles. No wheezing. Good air movement   Abdomen: Soft/+BS, non tender, non distended, no guarding  Extremities: trace LE edema, No lymphangitis, No petechiae, No rashes, no synovitis  Data Reviewed: Basic Metabolic Panel:  Recent Labs Lab 12/01/14 1635 12/03/14 1150 12/04/14 0605 12/05/14 0800 12/06/14 1050  NA 139 139 137 140 137  K 3.9 4.1 3.8 3.9 4.3  CL 97 97 98 99 103  CO2 32 31 32 30 26  GLUCOSE 40* 81 134* 114* 185*  BUN 40* 41* 43* 36* 25*  CREATININE 2.26* 2.84* 2.74* 2.20* 1.67*  CALCIUM 8.1* 7.1* 6.8* 7.7* 8.1*  MG  --   --   --  1.8 1.6   Liver Function  Tests:  Recent Labs Lab 12/01/14 1635 12/03/14 1150  AST 24 69*  ALT 40* 51*  ALKPHOS 117 115  BILITOT 0.8 0.8  PROT 6.6 6.2  ALBUMIN 3.4* 2.9*   No results for input(s): LIPASE, AMYLASE in the last 168 hours. No results for input(s): AMMONIA in the last 168 hours. CBC:  Recent Labs Lab 12/01/14 1635 12/03/14 1150 12/04/14 0605  WBC 11.1* 9.7 10.4  NEUTROABS 7.6 6.4  --   HGB 14.9 14.6 13.3  HCT 46.7* 45.7 42.7  MCV 77.7* 77.9* 76.9*  PLT 277.0 254 246   Cardiac Enzymes:  Recent Labs Lab 12/03/14 1443 12/04/14 1103 12/04/14 1640  TROPONINI 0.15* 0.11* 0.11*   BNP: Invalid input(s): POCBNP CBG:  Recent Labs Lab 12/05/14 1755 12/05/14 2203 12/06/14 1207 12/06/14 1514 12/06/14 1647  GLUCAP 72 142* 236* 211* 297*    Recent Results (from the past 240 hour(s))  Urine culture     Status: None   Collection Time: 12/04/14  1:08 AM  Result Value Ref Range Status   Specimen Description URINE, CATHETERIZED  Final   Special Requests NONE  Final   Colony Count NO GROWTH Performed at [facility]   Final   Culture NO GROWTH Performed at [facility]   Final   Report Status 12/06/2014 FINAL  Final  Surgical pcr screen     Status: None   Collection Time: 12/05/14  6:07 PM  Result Value Ref Range  Status   MRSA, PCR NEGATIVE NEGATIVE Final   Staphylococcus aureus NEGATIVE NEGATIVE Final    Comment:        The Xpert SA Assay (FDA approved for NASAL specimens in patients over 61 years of age), is one component of a comprehensive surveillance program.  Test performance has been validated by [facility] for patients greater than or equal to 65 year old. It is not intended to diagnose infection nor to guide or monitor treatment.      Scheduled Meds: . ALPRAZolam  0.5 mg Oral BID  . aspirin  325 mg Oral Daily  . feeding supplement (ENSURE COMPLETE)  237 mL Oral BID BM  . gabapentin  200 mg Oral BID  . heparin  5,000 Units Subcutaneous 3  times per day  . insulin aspart  0-5 Units Subcutaneous QHS  . insulin aspart  0-9 Units Subcutaneous TID WC  . methadone  10 mg Oral Q12H  . metoprolol tartrate  12.5 mg Oral BID  . mirtazapine  15 mg Oral QHS  . pantoprazole  40 mg Oral BID  . pravastatin  40 mg Oral q1800  . sodium chloride  3 mL Intravenous Q12H  . sodium chloride  3 mL Intravenous Q12H  . sodium chloride  3 mL Intravenous Q12H  . torsemide  20 mg Oral BID   Continuous Infusions:    Eppie Barhorst, DO  [facility] Pager [phone]  If 7PM-7AM, please contact night-coverage [email or web address] Password [email or web address] 12/06/2014, 7:48 PM   LOS: 3 days

## 2014-12-06 NOTE — Evaluation (Signed)
Physical Therapy Evaluation Patient Details Name: Lindsay Harmon MRN: [id number] DOB: July 15, 1956 Today's Date: 12/06/2014   History of Present Illness  pt is 59 y/o female who came to the ER where workup was a digestive of acute on chronic renal failure, she had evidence of 1-2+ edema, elevated JVD and I was called to admit the patient. Patient denies any headache, no fever or chills, no chest pain, she has no shortness of breath orthopnea, denies any belly pain or diarrhea, no focal weakness. She does complain of weight gain, edema in her legs, over the last 7-10 days  Clinical Impression  Pt admitted with/for Acute on Chronic renal failure.  Pt currently limited functionally due to the problems listed below.  (see problems list.)  Pt will benefit from PT to maximize function and safety to be able to get home safely with available assist of family.     Follow Up Recommendations Home health PT;Supervision - Intermittent    Equipment Recommendations  None recommended by PT    Recommendations for Other Services       Precautions / Restrictions Precautions Precautions: Fall      Mobility  Bed Mobility Overal bed mobility: Independent                Transfers Overall transfer level: Needs assistance Equipment used: None Transfers: Sit to/from Stand Sit to Stand: Supervision         General transfer comment: generally steady and safe  Ambulation/Gait Ambulation/Gait assistance: Min assist;Min guard Ambulation Distance (Feet): 160 Feet Assistive device: None Gait Pattern/deviations: Step-through pattern Gait velocity: slower   General Gait Details: mildly unsteady through with arms held in guard position  Stairs            Wheelchair Mobility    Modified Rankin (Stroke Patients Only)       Balance Overall balance assessment: Needs assistance Sitting-balance support: No upper extremity supported Sitting balance-Leahy Scale: Good     Standing balance  support: No upper extremity supported Standing balance-Leahy Scale: Fair                               Pertinent Vitals/Pain Pain Assessment: No/denies pain    Home Living Family/patient expects to be discharged to:: Private residence Living Arrangements: Alone Available Help at Discharge: Family;Available PRN/intermittently (lots of family live close by) Type of Home: Mobile home Home Access: Stairs to enter Entrance Stairs-Rails: Psychiatric nurse of Steps: 4 Home Layout: One level Home Equipment: None      Prior Function Level of Independence: Independent               Hand Dominance        Extremity/Trunk Assessment   Upper Extremity Assessment: Overall WFL for tasks assessed           Lower Extremity Assessment: Overall WFL for tasks assessed      Cervical / Trunk Assessment: Normal  Communication   Communication: No difficulties  Cognition Arousal/Alertness: Awake/alert Behavior During Therapy: WFL for tasks assessed/performed Overall Cognitive Status: Within Functional Limits for tasks assessed                      General Comments General comments (skin integrity, edema, etc.): Removed oxygen initially and within a minutes time pt's sats had dropped to upper 60's with resting HR at 119 bpm.  Oxygen replaced and pt sat until sats > 905  on 4L    Exercises        Assessment/Plan    PT Assessment Patient needs continued PT services  PT Diagnosis Difficulty walking   PT Problem List Decreased activity tolerance;Decreased balance;Decreased mobility;Cardiopulmonary status limiting activity  PT Treatment Interventions DME instruction;Gait training;Stair training;Functional mobility training;Therapeutic activities;Patient/family education;Balance training   PT Goals (Current goals can be found in the Care Plan section) Acute Rehab PT Goals Patient Stated Goal: be able to stay by myself PT Goal Formulation: With  patient Time For Goal Achievement: 12/13/14 Potential to Achieve Goals: Good    Frequency Min 3X/week   Barriers to discharge        Co-evaluation               End of Session   Activity Tolerance: Patient tolerated treatment well Patient left: in bed;with call bell/phone within reach;with family/visitor present Nurse Communication: Mobility status         Time: 1432-1500 PT Time Calculation (min) (ACUTE ONLY): 28 min   Charges:   PT Evaluation $Initial PT Evaluation Tier I: 1 Procedure PT Treatments $Gait Training: 8-22 mins   PT G Codes:        Caera Enwright, Tessie Fass 12/06/2014, 3:16 PM  12/06/2014  Donnella Sham, PT [phone]  (pager)

## 2014-12-06 NOTE — Progress Notes (Signed)
Advanced Heart Failure Rounding Note   Subjective:    Feels ok. Denies orthopnea or PND. [place] numbers reviewed with her. BMET pending    Objective:   Weight Range:  Vital Signs:   Temp:  [98.4 F (36.9 C)-98.8 F (37.1 C)] 98.4 F (36.9 C) (02/08 2121) Pulse Rate:  [88-101] 100 (02/08 2121) Resp:  [16-18] 18 (02/08 2121) BP: (110-122)/(51-64) 118/58 mmHg (02/08 2121) SpO2:  [91 %-93 %] 92 % (02/08 2121) Last BM Date: 12/03/14  Weight change: Filed Weights   12/03/14 1123 12/04/14 0525  Weight: 64.864 kg (143 lb) 66.3 kg (146 lb 2.6 oz)    Intake/Output:   Intake/Output Summary (Last 24 hours) at 12/06/14 0549 Last data filed at 12/05/14 2009  Gross per 24 hour  Intake 529.83 ml  Output   1000 ml  Net -470.17 ml     Physical Exam: General:  Chronically ill  appearing. No resp difficulty HEENT: normal Neck: supple. JVP 9-10 with prominent CV waves . Carotids 2+ bilat; no bruits. No lymphadenopathy or thryomegaly appreciated. Cor: PMI nondisplaced. RTachy regular . No rubs, gallops or murmurs. Lungs: decreased throughout Abdomen: soft, nontender, nondistended. No hepatosplenomegaly. No bruits or masses. Good bowel sounds. Extremities: no cyanosis, + clubbing, tr edema Neuro: alert & orientedx3, cranial nerves grossly intact. moves all 4 extremities w/o difficulty. Affect pleasant  Telemetry: sinus tach 115  Labs: Basic Metabolic Panel:  Recent Labs Lab 12/01/14 1635 12/03/14 1150 12/04/14 0605 12/05/14 0800  NA 139 139 137 140  K 3.9 4.1 3.8 3.9  CL 97 97 98 99  CO2 32 31 32 30  GLUCOSE 40* 81 134* 114*  BUN 40* 41* 43* 36*  CREATININE 2.26* 2.84* 2.74* 2.20*  CALCIUM 8.1* 7.1* 6.8* 7.7*  MG  --   --   --  1.8    Liver Function Tests:  Recent Labs Lab 12/01/14 1635 12/03/14 1150  AST 24 69*  ALT 40* 51*  ALKPHOS 117 115  BILITOT 0.8 0.8  PROT 6.6 6.2  ALBUMIN 3.4* 2.9*   No results for input(s): LIPASE, AMYLASE in the last 168 hours. No  results for input(s): AMMONIA in the last 168 hours.  CBC:  Recent Labs Lab 12/01/14 1635 12/03/14 1150 12/04/14 0605  WBC 11.1* 9.7 10.4  NEUTROABS 7.6 6.4  --   HGB 14.9 14.6 13.3  HCT 46.7* 45.7 42.7  MCV 77.7* 77.9* 76.9*  PLT 277.0 254 246    Cardiac Enzymes:  Recent Labs Lab 12/03/14 1443 12/04/14 1103 12/04/14 1640  TROPONINI 0.15* 0.11* 0.11*    BNP: BNP (last 3 results)  Recent Labs  12/04/14 1103  BNP 959.4*    ProBNP (last 3 results) No results for input(s): PROBNP in the last 8760 hours.    Other results:  EKG:   Imaging: US Abdomen Complete  12/05/2014   CLINICAL DATA:  Initial encounter for hepatic insufficiency.  EXAM: ULTRASOUND ABDOMEN COMPLETE  COMPARISON:  Renal ultrasound from 12/03/2014.  FINDINGS: Gallbladder: No evidence for gallstones. Gallbladder wall is mildly thickened at 3-4 mm. No pericholecystic fluid. The sonographer reports no sonographic Murphy sign.  Common bile duct: Diameter: Dilated 8 mm diameter.  Liver: No substantial intrahepatic biliary duct dilatation. No focal intrahepatic mass lesion. Liver contour appears slightly nodular. Left hepatic lobe is prominent.  IVC: No abnormality visualized.  Pancreas: Visualized portion unremarkable.  Spleen: Size and appearance within normal limits.  Right Kidney: Length: 11.8 cm. Renal parenchyma shows diffusely increased echogenicity. No hydronephrosis.  Left Kidney: Length: 11.0 cm. Increased echogenicity. No hydronephrosis.  Abdominal aorta: Atherosclerotic wall irregularity is evident.  Other findings: Small bilateral pleural effusions are evident. There is a small amount of fluid around the liver and in the right paracolic gutter.  IMPRESSION: Subtle nodularity of the liver contour raises the question of cirrhosis. No intrahepatic focal mass lesion.  Borderline gallbladder wall thickening without gallstones or pericholecystic fluid. Extrahepatic common duct is mildly distended although no  associated intrahepatic biliary duct dilatation is evident. Gallbladder wall thickening may be secondary to hypoproteinemia or systemic disease. If there is clinical concern for cholecystitis, nuclear scintigraphy may prove helpful to further evaluate.  Small pleural effusions with small volume ascites.  Echogenic kidneys compatible with medical renal disease.  Atherosclerosis.   Electronically Signed   By: Misty Stanley M.D.   On: 12/05/2014 12:23   Nm Pulmonary Perf And Vent  12/05/2014   CLINICAL DATA:  Shortness of breath and pulmonary hypertension  EXAM: NUCLEAR MEDICINE VENTILATION - PERFUSION LUNG SCAN  TECHNIQUE: Ventilation images were obtained in multiple projections using inhaled aerosol technetium 99 M DTPA. Perfusion images were obtained in multiple projections after intravenous injection of Tc-59mMAA.  RADIOPHARMACEUTICALS:  Forty mCi Tc-963mTPA aerosol and 6 mCi Tc-9955mA  COMPARISON:  None.  FINDINGS: Ventilation: No focal ventilation defect. Some free technetium is noted within the stomach as well as trapping centrally in the left the lung.  Perfusion: No wedge shaped peripheral perfusion defects to suggest acute pulmonary embolism.  IMPRESSION: No evidence of pulmonary embolism.   Electronically Signed   By: MarInez CatalinaD.   On: 12/05/2014 16:36      Medications:     Scheduled Medications: . ALPRAZolam  0.5 mg Oral BID  . aspirin  325 mg Oral Daily  . feeding supplement (ENSURE COMPLETE)  237 mL Oral BID BM  . gabapentin  200 mg Oral BID  . heparin  5,000 Units Subcutaneous 3 times per day  . insulin aspart  0-5 Units Subcutaneous QHS  . insulin aspart  0-9 Units Subcutaneous TID WC  . methadone  10 mg Oral Q12H  . mirtazapine  15 mg Oral QHS  . pantoprazole  40 mg Oral BID  . pravastatin  40 mg Oral q1800  . sodium chloride  3 mL Intravenous Q12H  . sodium chloride  3 mL Intravenous Q12H  . sodium chloride  3 mL Intravenous Q12H     Infusions:     PRN  Medications:  sodium chloride, acetaminophen, guaiFENesin-dextromethorphan, HYDROcodone-acetaminophen, nitroGLYCERIN, ondansetron (ZOFRAN) IV, polyethylene glycol, sodium chloride, sodium chloride   Assessment:   1. Severe PAH with cor pulmonale 2. COPD 3. Acute on chronic renal failure, stage IV 4. Probable OSA 5. Acute on chronic respiratory failure 6. Chronic diastolic HF  Plan/Discussion:    RHC with severe PAH with normal PCWP and normal output. Suspect severe PAH due to chronic hypoxia in setting of severe COPD and OSA (WHO Group III) +/- small component of diastolic HF (WHO Group II)  Recommend:  1) PFTs with DLCO and ABG 2) Outpatient sleep study 3) Supplemental O2 to keep sats >= 90% at all times 4) [facility] Doubt she will be candidate for selective pulmonary vasodilators but will await results of PFTs.  6) Decrease torsemide dosing to 20 bid with sliding scale to keep weight stable.  7) Can go home later today or in am. F/u in HF Clinic with me or Dr. MclAundra Dubin  [phone] 8) Will need outpatient pulmonary appt with Dr. Ramonita Lab of Stay: 3   Glori Bickers MD 12/06/2014, 5:49 AM  Advanced Heart Failure Team Pager [phone] (M-F; [place])  Please contact [facility] for night-coverage after hours (4p -7a ) and weekends on [email or web address]

## 2014-12-06 NOTE — Consult Note (Signed)
Name: Lindsay Harmon MRN: [id number] DOB: April 19, 1956    ADMISSION DATE:  12/03/2014 CONSULTATION DATE:  12/06/14  REFERRING MD :  Dr. Carles Collet   CHIEF COMPLAINT:  Swelling   BRIEF PATIENT DESCRIPTION: 59 y/o F admitted on 2/6 with increased swelling that was not responsive to diuresis. Work up consistent with acute on chronic renal failure and decompensated CHF.    SIGNIFICANT EVENTS  2/06  Admit with decompensated CHF, acute on chronic renal failure    STUDIES:  2/07  ECHO >> EF 60-65%, no RWMA, grade 1 DD, PA peak 71, RV SP increased c/w severe pulmonary hypertension  2/08  VQ Scan >> neg for PE,  2/08  RHC >> severe PAH of undefined etiology, no evidence of R to L shunt, normal L ventricular pressures.  PA pressure 97/30   HISTORY OF PRESENT ILLNESS:  59 y/o F, former smoker (40 years, quit 2010) with a PMH of depression, GERD, HTN, HLD, PVD s/p carotid endarterectomy, NIDDM, IBS, esophageal strictures, obesity, and abdominal hysterectomy who was admitted on 2/6 with reports of increased lower extremity swelling despite increase in her home diuretic regimen.  She noted decreased UOP and had been told her kidney function was worsening by her PCP.  Initial work up concerning for decompensated CHF and acute on chronic renal disease.  The patient was admitted per [facility] for further evaluation.  ECHO was assessed and consistent with severe pulmonary hypertension.  Cardiology was consulted for evaluation.  The patient was ruled out for chronic thromboembolic disease with a VQ scan (negative). She ultimately underwent at [facility] on 2/08 which was consistent with severe PAH of undefined etiology, no  evidence of R to L shunt, normal L ventricular pressures.  PA pressure 97/30.  PCCM consulted for pulmonary hypertension evaluation.   PAST MEDICAL HISTORY :   has a past medical history of Depression; GERD (gastroesophageal reflux disease); Hyperlipidemia; Hypertension; PVD (peripheral vascular disease); CAD  (coronary artery disease); Familial hematuria; NIDDM (non-insulin dependent diabetes mellitus); IBS (irritable bowel syndrome); Esophageal stricture; Obesity, unspecified; Nephrolithiasis; Allergy; and Arthritis.  has past surgical history that includes Abdominal hysterectomy; Cesarean section; Carpal tunnel release; and right heart catheterization (N/A, 12/05/2014).   HOME MEDICATIONS: Prior to Admission medications   Medication Sig Start Date End Date Taking? Authorizing Provider  ACCU-CHEK FASTCLIX LANCETS MISC Use to check blood sugar once daily dx: E11.49 09/07/14  Yes Venia Carbon, MD  ALPRAZolam Duanne Moron) 0.25 MG tablet TAKE ONE (1) TABLET BY MOUTH EVERY DAY 09/27/14  Yes Venia Carbon, MD  aspirin 325 MG tablet Take 325 mg by mouth daily.     Yes Historical Provider, MD  gabapentin (NEURONTIN) 300 MG capsule TAKE 1 CAPSULE FOUR TIMES DAILY 11/08/14  Yes Venia Carbon, MD  glucose blood (ACCU-CHEK AVIVA PLUS) test strip Use as instructed to check blood sugar once daily dx: E11.49 09/07/14  Yes Venia Carbon, MD  lisinopril (PRINIVIL,ZESTRIL) 20 MG tablet Take 1 tablet (20 mg total) by mouth daily. 11/29/13  Yes Venia Carbon, MD  loratadine (CLARITIN) 10 MG tablet Take 10 mg by mouth 2 (two) times daily.    Yes Historical Provider, MD  lovastatin (MEVACOR) 40 MG tablet TAKE 2 TABLETS EVERY DAY 09/23/14  Yes Venia Carbon, MD  metFORMIN (GLUCOPHAGE) 1000 MG tablet TAKE 1/2 TABLET TWICE DAILY WITH A MEAL 09/14/14  Yes Venia Carbon, MD  methadone (DOLOPHINE) 10 MG tablet Take 2 tablets four times a day. 11/30/14  Yes Venia Carbon, MD  metoprolol tartrate (LOPRESSOR) 25 MG tablet TAKE 1 TABLET TWICE DAILY 09/14/14  Yes Venia Carbon, MD  mirtazapine (REMERON) 30 MG tablet TAKE 1/2 TO 1 TABLET AT BEDTIME 09/14/14  Yes Venia Carbon, MD  nitroGLYCERIN (NITROSTAT) 0.4 MG SL tablet Place 1 tablet (0.4 mg total) under the tongue every 5 (five) minutes as needed. 11/18/12  Yes  Venia Carbon, MD  pantoprazole (PROTONIX) 40 MG tablet Take 1 tablet (40 mg total) by mouth 2 (two) times daily. 11/29/13  Yes Venia Carbon, MD  zoster vaccine live, PF, (ZOSTAVAX) 38182 UNT/0.65ML injection Inject 19,400 Units into the skin once. 11/08/14  Yes Venia Carbon, MD  glimepiride (AMARYL) 1 MG tablet Take 1 tablet (1 mg total) by mouth daily with breakfast. 03/15/14   Venia Carbon, MD  torsemide (DEMADEX) 20 MG tablet TAKE 2 TABLETS TWICE DAILY 08/30/14   Venia Carbon, MD   No Known Allergies  FAMILY HISTORY:  family history includes Diabetes in her mother.   SOCIAL HISTORY:  reports that she has quit smoking. She has never used smokeless tobacco. She reports that she does not drink alcohol or use illicit drugs.  REVIEW OF SYSTEMS:   Constitutional: Negative for fever, chills, weight loss, malaise/fatigue and diaphoresis.  HENT: Negative for hearing loss, ear pain, nosebleeds, congestion, sore throat, neck pain, tinnitus and ear discharge.   Eyes: Negative for blurred vision, double vision, photophobia, pain, discharge and redness.  Respiratory: Negative for cough, hemoptysis, sputum production, wheezing and stridor.  Reports increased SOB & LE swelling  Cardiovascular: Negative for chest pain, palpitations, orthopnea, claudication, and PND.  Gastrointestinal: Negative for heartburn, nausea, vomiting, abdominal pain, diarrhea, constipation, blood in stool and melena.  Genitourinary: Negative for dysuria, urgency, frequency, hematuria and flank pain.  Musculoskeletal: Negative for myalgias, back pain, joint pain and falls.  Skin: Negative for itching and rash.  Neurological: Negative for dizziness, tingling, tremors, sensory change, speech change, focal weakness, seizures, loss of consciousness, weakness and headaches.  Endo/Heme/Allergies: Negative for environmental allergies and polydipsia. Does not bruise/bleed easily.  SUBJECTIVE: Reports feeling much better  than on admission   VITAL SIGNS: Temp:  [97.8 F (36.6 C)-98.7 F (37.1 C)] 97.8 F (36.6 C) (02/09 1508) Pulse Rate:  [100-113] 113 (02/09 1508) Resp:  [18-20] 20 (02/09 1508) BP: (118-133)/(51-71) 118/54 mmHg (02/09 1508) SpO2:  [92 %-93 %] 93 % (02/09 1508)  PHYSICAL EXAMINATION: General:  wdwn adult female in NAD  Neuro:  AAOx4, speech clear, MAE  HEENT:  Mm pink/moist, no jvd Cardiovascular:  s1s2 regular, tachy, no m/r/g Lungs:  resp's even/non-labored on [place] O2, lungs bilaterally with posterior fine lower crackles  Abdomen:  NTND, bsx4 active, tolerating PO's Musculoskeletal:  No acute deformities  Skin:  Warm/dry, no edema, mild erythema bilaterally    Recent Labs Lab 12/04/14 0605 12/05/14 0800 12/06/14 1050  NA 137 140 137  K 3.8 3.9 4.3  CL 98 99 103  CO2 32 30 26  BUN 43* 36* 25*  CREATININE 2.74* 2.20* 1.67*  GLUCOSE 134* 114* 185*    Recent Labs Lab 12/01/14 1635 12/03/14 1150 12/04/14 0605  HGB 14.9 14.6 13.3  HCT 46.7* 45.7 42.7  WBC 11.1* 9.7 10.4  PLT 277.0 254 246   US Abdomen Complete  12/05/2014   CLINICAL DATA:  Initial encounter for hepatic insufficiency.  EXAM: ULTRASOUND ABDOMEN COMPLETE  COMPARISON:  Renal ultrasound from 12/03/2014.  FINDINGS: Gallbladder: No evidence  for gallstones. Gallbladder wall is mildly thickened at 3-4 mm. No pericholecystic fluid. The sonographer reports no sonographic Murphy sign.  Common bile duct: Diameter: Dilated 8 mm diameter.  Liver: No substantial intrahepatic biliary duct dilatation. No focal intrahepatic mass lesion. Liver contour appears slightly nodular. Left hepatic lobe is prominent.  IVC: No abnormality visualized.  Pancreas: Visualized portion unremarkable.  Spleen: Size and appearance within normal limits.  Right Kidney: Length: 11.8 cm. Renal parenchyma shows diffusely increased echogenicity. No hydronephrosis.  Left Kidney: Length: 11.0 cm. Increased echogenicity. No hydronephrosis.  Abdominal aorta:  Atherosclerotic wall irregularity is evident.  Other findings: Small bilateral pleural effusions are evident. There is a small amount of fluid around the liver and in the right paracolic gutter.  IMPRESSION: Subtle nodularity of the liver contour raises the question of cirrhosis. No intrahepatic focal mass lesion.  Borderline gallbladder wall thickening without gallstones or pericholecystic fluid. Extrahepatic common duct is mildly distended although no associated intrahepatic biliary duct dilatation is evident. Gallbladder wall thickening may be secondary to hypoproteinemia or systemic disease. If there is clinical concern for cholecystitis, nuclear scintigraphy may prove helpful to further evaluate.  Small pleural effusions with small volume ascites.  Echogenic kidneys compatible with medical renal disease.  Atherosclerosis.   Electronically Signed   By: Misty Stanley M.D.   On: 12/05/2014 12:23   Nm Pulmonary Perf And Vent  12/05/2014   CLINICAL DATA:  Shortness of breath and pulmonary hypertension  EXAM: NUCLEAR MEDICINE VENTILATION - PERFUSION LUNG SCAN  TECHNIQUE: Ventilation images were obtained in multiple projections using inhaled aerosol technetium 99 M DTPA. Perfusion images were obtained in multiple projections after intravenous injection of Tc-16mMAA.  RADIOPHARMACEUTICALS:  Forty mCi Tc-962mTPA aerosol and 6 mCi Tc-9951mA  COMPARISON:  None.  FINDINGS: Ventilation: No focal ventilation defect. Some free technetium is noted within the stomach as well as trapping centrally in the left the lung.  Perfusion: No wedge shaped peripheral perfusion defects to suggest acute pulmonary embolism.  IMPRESSION: No evidence of pulmonary embolism.   Electronically Signed   By: MarInez CatalinaD.   On: 12/05/2014 16:36    ASSESSMENT / PLAN:  Severe Pulmonary Hypertension with Cor Pulmonale - likely secondary PAH in the setting of chronic hypoxemia with probable undiagnosed OSA & severe COPD Probable  OSA Acute on Chronic Hypoxemic Respiratory Failure - baseline pO2 55 on ABG + hypercarbia, qualifies for O2 Chronic Diastolic CHF Acute on Chronic Renal Failure  Plan: Plan for outpatient sleep study PFT's  ABG Reviewed Assess for oxygen needs prior to discharge, goal saturations > 92% Lasix / fluid balance per Cardiology / CHF Service Follow up with Dr. [place] Bells an outpatient (arranged)   BraNoe GensP-C LeBColtongr: [phone] or [phone] 12/06/2014, 3:46 PM

## 2014-12-06 NOTE — Progress Notes (Signed)
Room air patient's O2 sat 60 .  Unable to ambulated in hall on room air patient's, O2 sat on 4l back up to 90 & ambulated in hall.  Wynetta Emery, Lucielle Vokes C 12/06/2014  5:39 PM

## 2014-12-07 ENCOUNTER — Telehealth: Payer: Self-pay | Admitting: Internal Medicine

## 2014-12-07 DIAGNOSIS — N183 Chronic kidney disease, stage 3 (moderate): Secondary | ICD-10-CM

## 2014-12-07 DIAGNOSIS — J9621 Acute and chronic respiratory failure with hypoxia: Secondary | ICD-10-CM

## 2014-12-07 LAB — BASIC METABOLIC PANEL
Anion gap: 9 (ref 5–15)
BUN: 24 mg/dL — ABNORMAL HIGH (ref 6–23)
CALCIUM: 8.3 mg/dL — AB (ref 8.4–10.5)
CO2: 27 mmol/L (ref 19–32)
CREATININE: 1.54 mg/dL — AB (ref 0.50–1.10)
Chloride: 104 mmol/L (ref 96–112)
GFR calc Af Amer: 41 mL/min — ABNORMAL LOW (ref >90)
GFR, EST NON AFRICAN AMERICAN: 35 mL/min — AB (ref >90)
Glucose, Bld: 115 mg/dL — ABNORMAL HIGH (ref 70–99)
Potassium: 4.5 mmol/L (ref 3.5–5.1)
SODIUM: 140 mmol/L (ref 135–145)

## 2014-12-07 LAB — CBC
HCT: 41.9 % (ref 36.0–46.0)
Hemoglobin: 12.8 g/dL (ref 12.0–15.0)
MCH: 24 pg — ABNORMAL LOW (ref 26.0–34.0)
MCHC: 30.5 g/dL (ref 30.0–36.0)
MCV: 78.6 fL (ref 78.0–100.0)
Platelets: 227 10*3/uL (ref 150–400)
RBC: 5.33 MIL/uL — ABNORMAL HIGH (ref 3.87–5.11)
RDW: 18.4 % — AB (ref 11.5–15.5)
WBC: 11.9 10*3/uL — ABNORMAL HIGH (ref 4.0–10.5)

## 2014-12-07 LAB — GLUCOSE, CAPILLARY
GLUCOSE-CAPILLARY: 222 mg/dL — AB (ref 70–99)
Glucose-Capillary: 117 mg/dL — ABNORMAL HIGH (ref 70–99)
Glucose-Capillary: 128 mg/dL — ABNORMAL HIGH (ref 70–99)

## 2014-12-07 MED ORDER — METOPROLOL TARTRATE 25 MG PO TABS: 12.5000 mg | ORAL_TABLET | Freq: Two times a day (BID) | ORAL | Status: DC

## 2014-12-07 MED ORDER — ENSURE COMPLETE PO LIQD: 237.0000 mL | Freq: Two times a day (BID) | ORAL | Status: DC

## 2014-12-07 MED ORDER — GLIPIZIDE 5 MG PO TABS: 5.0000 mg | ORAL_TABLET | Freq: Every day | ORAL | Status: DC

## 2014-12-07 MED ORDER — TORSEMIDE 20 MG PO TABS: 20.0000 mg | ORAL_TABLET | Freq: Two times a day (BID) | ORAL | Status: DC

## 2014-12-07 NOTE — Telephone Encounter (Signed)
I spoke to her in hospital this morning--she is better. Reviewed her work up and diagnosis She expects to go home today--with oxygen Reviewed that she would be following with cardiology and pulmonary now  Got discharge summary and it states follow up with me on 2/16 at 11Am---which is open and fine but not on my schedule. Please confirm with her and get it on the schedule Thanks

## 2014-12-07 NOTE — Progress Notes (Addendum)
Advanced Heart Failure Rounding Note   Subjective:    Continues with significant desats off O2. Weight stable. Creatinine down to 1.6 yesterday   PFTs with severe COPD  FEV1 0.87 (38%) FVC  1.41 (48%) FEF 25-75  0.41 (19%) Unable to do DLCO   Objective:   Weight Range:  Vital Signs:   Temp:  [97.6 F (36.4 C)-98.2 F (36.8 C)] 97.6 F (36.4 C) (02/10 0500) Pulse Rate:  [81-113] 81 (02/10 0500) Resp:  [18-20] 18 (02/10 0500) BP: (115-148)/(54-81) 115/61 mmHg (02/10 0500) SpO2:  [88 %-93 %] 90 % (02/10 0500) Weight:  [66.588 kg (146 lb 12.8 oz)] 66.588 kg (146 lb 12.8 oz) (02/10 0500) Last BM Date: 12/04/14  Weight change: Filed Weights   12/03/14 1123 12/04/14 0525 12/07/14 0500  Weight: 64.864 kg (143 lb) 66.3 kg (146 lb 2.6 oz) 66.588 kg (146 lb 12.8 oz)    Intake/Output:   Intake/Output Summary (Last 24 hours) at 12/07/14 0807 Last data filed at 12/07/14 0019  Gross per 24 hour  Intake    459 ml  Output   1800 ml  Net  -1341 ml     Physical Exam: General:  Chronically ill  appearing. No resp difficulty HEENT: normal Neck: supple. JVP 8 with prominent CV waves . Carotids 2+ bilat; no bruits. No lymphadenopathy or thryomegaly appreciated. Cor: PMI nondisplaced.  regular . No rubs, gallops or murmurs. Prominent s2 Lungs: decreased throughout Abdomen: soft, nontender, nondistended. No hepatosplenomegaly. No bruits or masses. Good bowel sounds. Extremities: no cyanosis, + clubbing, tr edema Neuro: alert & orientedx3, cranial nerves grossly intact. moves all 4 extremities w/o difficulty. Affect pleasant  Telemetry: sinus 90s  Labs: Basic Metabolic Panel:  Recent Labs Lab 12/01/14 1635 12/03/14 1150 12/04/14 0605 12/05/14 0800 12/06/14 1050  NA 139 139 137 140 137  K 3.9 4.1 3.8 3.9 4.3  CL 97 97 98 99 103  CO2 32 31 32 30 26  GLUCOSE 40* 81 134* 114* 185*  BUN 40* 41* 43* 36* 25*  CREATININE 2.26* 2.84* 2.74* 2.20* 1.67*  CALCIUM 8.1* 7.1* 6.8*  7.7* 8.1*  MG  --   --   --  1.8 1.6    Liver Function Tests:  Recent Labs Lab 12/01/14 1635 12/03/14 1150  AST 24 69*  ALT 40* 51*  ALKPHOS 117 115  BILITOT 0.8 0.8  PROT 6.6 6.2  ALBUMIN 3.4* 2.9*   No results for input(s): LIPASE, AMYLASE in the last 168 hours. No results for input(s): AMMONIA in the last 168 hours.  CBC:  Recent Labs Lab 12/01/14 1635 12/03/14 1150 12/04/14 0605  WBC 11.1* 9.7 10.4  NEUTROABS 7.6 6.4  --   HGB 14.9 14.6 13.3  HCT 46.7* 45.7 42.7  MCV 77.7* 77.9* 76.9*  PLT 277.0 254 246    Cardiac Enzymes:  Recent Labs Lab 12/03/14 1443 12/04/14 1103 12/04/14 1640  TROPONINI 0.15* 0.11* 0.11*    BNP: BNP (last 3 results)  Recent Labs  12/04/14 1103  BNP 959.4*    ProBNP (last 3 results) No results for input(s): PROBNP in the last 8760 hours.    Other results:    Imaging: US Abdomen Complete  12/05/2014   CLINICAL DATA:  Initial encounter for hepatic insufficiency.  EXAM: ULTRASOUND ABDOMEN COMPLETE  COMPARISON:  Renal ultrasound from 12/03/2014.  FINDINGS: Gallbladder: No evidence for gallstones. Gallbladder wall is mildly thickened at 3-4 mm. No pericholecystic fluid. The sonographer reports no sonographic Murphy sign.  Common bile  duct: Diameter: Dilated 8 mm diameter.  Liver: No substantial intrahepatic biliary duct dilatation. No focal intrahepatic mass lesion. Liver contour appears slightly nodular. Left hepatic lobe is prominent.  IVC: No abnormality visualized.  Pancreas: Visualized portion unremarkable.  Spleen: Size and appearance within normal limits.  Right Kidney: Length: 11.8 cm. Renal parenchyma shows diffusely increased echogenicity. No hydronephrosis.  Left Kidney: Length: 11.0 cm. Increased echogenicity. No hydronephrosis.  Abdominal aorta: Atherosclerotic wall irregularity is evident.  Other findings: Small bilateral pleural effusions are evident. There is a small amount of fluid around the liver and in the right  paracolic gutter.  IMPRESSION: Subtle nodularity of the liver contour raises the question of cirrhosis. No intrahepatic focal mass lesion.  Borderline gallbladder wall thickening without gallstones or pericholecystic fluid. Extrahepatic common duct is mildly distended although no associated intrahepatic biliary duct dilatation is evident. Gallbladder wall thickening may be secondary to hypoproteinemia or systemic disease. If there is clinical concern for cholecystitis, nuclear scintigraphy may prove helpful to further evaluate.  Small pleural effusions with small volume ascites.  Echogenic kidneys compatible with medical renal disease.  Atherosclerosis.   Electronically Signed   By: Misty Stanley M.D.   On: 12/05/2014 12:23   Nm Pulmonary Perf And Vent  12/05/2014   CLINICAL DATA:  Shortness of breath and pulmonary hypertension  EXAM: NUCLEAR MEDICINE VENTILATION - PERFUSION LUNG SCAN  TECHNIQUE: Ventilation images were obtained in multiple projections using inhaled aerosol technetium 99 M DTPA. Perfusion images were obtained in multiple projections after intravenous injection of Tc-61mMAA.  RADIOPHARMACEUTICALS:  Forty mCi Tc-996mTPA aerosol and 6 mCi Tc-9942mA  COMPARISON:  None.  FINDINGS: Ventilation: No focal ventilation defect. Some free technetium is noted within the stomach as well as trapping centrally in the left the lung.  Perfusion: No wedge shaped peripheral perfusion defects to suggest acute pulmonary embolism.  IMPRESSION: No evidence of pulmonary embolism.   Electronically Signed   By: MarInez CatalinaD.   On: 12/05/2014 16:36     Medications:     Scheduled Medications: . ALPRAZolam  0.5 mg Oral BID  . aspirin  325 mg Oral Daily  . feeding supplement (ENSURE COMPLETE)  237 mL Oral BID BM  . gabapentin  200 mg Oral BID  . heparin  5,000 Units Subcutaneous 3 times per day  . insulin aspart  0-5 Units Subcutaneous QHS  . insulin aspart  0-9 Units Subcutaneous TID WC  . methadone  10  mg Oral Q12H  . metoprolol tartrate  12.5 mg Oral BID  . mirtazapine  15 mg Oral QHS  . pantoprazole  40 mg Oral BID  . pravastatin  40 mg Oral q1800  . sodium chloride  3 mL Intravenous Q12H  . sodium chloride  3 mL Intravenous Q12H  . sodium chloride  3 mL Intravenous Q12H  . torsemide  20 mg Oral BID    Infusions:    PRN Medications: sodium chloride, acetaminophen, guaiFENesin-dextromethorphan, HYDROcodone-acetaminophen, nitroGLYCERIN, ondansetron (ZOFRAN) IV, polyethylene glycol, sodium chloride, sodium chloride   Assessment:   1. Severe PAH with cor pulmonale 2. COPD 3. Acute on chronic renal failure, stage IV 4. Probable OSA 5. Acute on chronic respiratory failure 6. Chronic diastolic HF  Plan/Discussion:    RHC with severe PAH with normal PCWP and normal output. Suspect severe PAH due to chronic hypoxia in setting of severe COPD and OSA (WHO Group III) +/- small component of diastolic HF (WHO Group II)  PFTs reviewed with  her and confirm very severe COPD. Will not be candidate for selective pulmonary vasodilators.   Recommend:  1) Outpatient sleep study 2) Supplemental O2 to keep sats >= 90% at all times 3) Pulmonary rehab at [facility] 4) Decrease torsemide dosing to 20 bid with sliding scale to keep weight stable.  5) Can go home from our standpoint. F/u in HF Clinic with me or Dr. Aundra Dubin [phone]) 6) Will need outpatient pulmonary appt with Dr. [place] Bells to assist with [facility] and COPD regimen   Length of Stay: 4 Glori Bickers MD 12/07/2014, 8:07 AM  Advanced Heart Failure Team Pager [phone] (M-F; 7a - 4p)  Please contact [facility] for night-coverage after hours (4p -7a ) and weekends on [email or web address]

## 2014-12-07 NOTE — Care Management Note (Signed)
    Page 1 of 2   12/07/2014     3:52:24 PM CARE MANAGEMENT NOTE 12/07/2014  Patient:  Lindsay Harmon, Lindsay Harmon   Account Number:  [id number]  Date Initiated:  12/07/2014  Documentation initiated by:  Tomi Bamberger  Subjective/Objective Assessment:   dx aki,pulmonary htn,  admit- lives alone, she lives in the middle home of her sister and mother who lives on each side of her.     Action/Plan:   pt eval- rec hhpt.   Anticipated DC Date:  12/07/2014   Anticipated DC Plan:  [place]  CM consult      [facility] Choice  HOME HEALTH   Choice offered to / List presented to:  C-1 Patient   DME arranged  [facility]      DME agency  [place] arranged  HH-1 RN  [place].   Status of service:  Completed, signed off Medicare Important Message given?  YES (If response is "NO", the following Medicare IM given date fields will be blank) Date Medicare IM given:  12/05/2014 Medicare IM given by:  Tomi Bamberger Date Additional Medicare IM given:   Additional Medicare IM given by:    Discharge Disposition:  [place]  Per UR Regulation:  Reviewed for med. necessity/level of care/duration of stay  If discussed at [place] of Stay Meetings, dates discussed:    Comments:  12/07/14 [place] ,BSN [id number] patient lives alone, but has family members on each side of her.  Patient chose [place] for [place], [place], [place].  Referral made to [facility] , [facility] notified.  Soc will begin 24-48 hrs post dc.  NCM awaiting oxygen sats to fax to [place].

## 2014-12-07 NOTE — Discharge Summary (Addendum)
PATIENT DETAILS Name: Lindsay Harmon Age: 59 y.o. Sex: female Date of Birth: [birth year] MRN: [id number]. Admitting Physician: Thurnell Lose, MD [id number] Silvio Pate, MD  Admit Date: 12/03/2014 Discharge date: 12/07/2014  Recommendations for Outpatient Follow-up:  1. CBC and chemistries in 1 week follow-up. 2. Outpatient PFTs  PRIMARY DISCHARGE DIAGNOSIS:  Principal Problem:   ARF (acute renal failure) Active Problems:   Type 2 diabetes mellitus with neurological manifestations, controlled   Essential hypertension, benign   PVD (peripheral vascular disease)   Diabetes, polyneuropathy   DM (diabetes mellitus) type II controlled peripheral vascular disorder   Chronic kidney disease, stage III (moderate)   Acute kidney injury   CHF, acute on chronic   Acute renal failure superimposed on stage 3 chronic kidney disease   Ascites   Hypoalbuminemia   Demand ischemia   Acute on chronic systolic right heart failure   Pulmonary HTN      PAST MEDICAL HISTORY: Past Medical History  Diagnosis Date  . Depression   . GERD (gastroesophageal reflux disease)   . Hyperlipidemia   . Hypertension   . PVD (peripheral vascular disease)   . CAD (coronary artery disease)   . Familial hematuria   . NIDDM (non-insulin dependent diabetes mellitus)     with neuropathy  . IBS (irritable bowel syndrome)   . Esophageal stricture   . Obesity, unspecified   . Nephrolithiasis   . Allergy   . Arthritis     DISCHARGE MEDICATIONS: Current Discharge Medication List    START taking these medications   Details  feeding supplement, ENSURE COMPLETE, (ENSURE COMPLETE) LIQD Take 237 mLs by mouth 2 (two) times daily between meals. Qty: 60 Bottle, Refills: 0    glipiZIDE (GLUCOTROL) 5 MG tablet Take 1 tablet (5 mg total) by mouth daily before breakfast. Qty: 30 tablet, Refills: 0      CONTINUE these medications which have CHANGED   Details  metoprolol tartrate (LOPRESSOR) 25 MG tablet Take 0.5  tablets (12.5 mg total) by mouth 2 (two) times daily. Qty: 60 tablet, Refills: 0    torsemide (DEMADEX) 20 MG tablet Take 1 tablet (20 mg total) by mouth 2 (two) times daily. Qty: 60 tablet, Refills: 0      CONTINUE these medications which have NOT CHANGED   Details  ACCU-CHEK FASTCLIX LANCETS MISC Use to check blood sugar once daily dx: E11.49 Qty: 100 each, Refills: 3    ALPRAZolam (XANAX) 0.25 MG tablet TAKE ONE (1) TABLET BY MOUTH EVERY DAY Qty: 90 tablet, Refills: 0    aspirin 325 MG tablet Take 325 mg by mouth daily.      gabapentin (NEURONTIN) 300 MG capsule TAKE 1 CAPSULE FOUR TIMES DAILY Qty: 360 capsule, Refills: 3    glucose blood (ACCU-CHEK AVIVA PLUS) test strip Use as instructed to check blood sugar once daily dx: E11.49 Qty: 100 each, Refills: 3    loratadine (CLARITIN) 10 MG tablet Take 10 mg by mouth 2 (two) times daily.     lovastatin (MEVACOR) 40 MG tablet TAKE 2 TABLETS EVERY DAY Qty: 180 tablet, Refills: 3    methadone (DOLOPHINE) 10 MG tablet Take 2 tablets four times a day. Qty: 240 tablet, Refills: 0    mirtazapine (REMERON) 30 MG tablet TAKE 1/2 TO 1 TABLET AT BEDTIME Qty: 90 tablet, Refills: 3    nitroGLYCERIN (NITROSTAT) 0.4 MG SL tablet Place 1 tablet (0.4 mg total) under the tongue every 5 (five) minutes as needed. Qty: 60 tablet, Refills:  1    pantoprazole (PROTONIX) 40 MG tablet Take 1 tablet (40 mg total) by mouth 2 (two) times daily. Qty: 180 tablet, Refills: 3      STOP taking these medications     lisinopril (PRINIVIL,ZESTRIL) 20 MG tablet      metFORMIN (GLUCOPHAGE) 1000 MG tablet      zoster vaccine live, PF, (ZOSTAVAX) 25366 UNT/0.65ML injection      glimepiride (AMARYL) 1 MG tablet         ALLERGIES:  No Known Allergies  BRIEF HPI:  See H&P, Labs, Consult and Test reports for all details in brief, 59 year old female with a history of hypertension, diabetes, hyperlipidemia, CKD stage III presented with a 10 days of  increasing lower extremity edema and abdominal girth. She was found to have acute right-sided heart failure, and admitted for further evaluation and treatment  CONSULTATIONS:   cardiology and pulmonary/intensive care  PERTINENT RADIOLOGIC STUDIES: Dg Chest 2 View  12/03/2014   CLINICAL DATA:  Cough and congestion with shortness of breath. Fluid retention with decreased renal function.  EXAM: CHEST  2 VIEW  COMPARISON:  None.  FINDINGS: Patient slightly rotated to the left. Lungs are adequately inflated with mild prominence of the perihilar markings with small bilateral pleural effusions. Mild cardiomegaly and mild prominence of the main pulmonary artery segment. Mild degenerative change of the spine.  IMPRESSION: Constellation findings suggesting mild congestive heart failure.   Electronically Signed   By: Marin Olp M.D.   On: 12/03/2014 12:14   US Abdomen Complete  12/05/2014   CLINICAL DATA:  Initial encounter for hepatic insufficiency.  EXAM: ULTRASOUND ABDOMEN COMPLETE  COMPARISON:  Renal ultrasound from 12/03/2014.  FINDINGS: Gallbladder: No evidence for gallstones. Gallbladder wall is mildly thickened at 3-4 mm. No pericholecystic fluid. The sonographer reports no sonographic Murphy sign.  Common bile duct: Diameter: Dilated 8 mm diameter.  Liver: No substantial intrahepatic biliary duct dilatation. No focal intrahepatic mass lesion. Liver contour appears slightly nodular. Left hepatic lobe is prominent.  IVC: No abnormality visualized.  Pancreas: Visualized portion unremarkable.  Spleen: Size and appearance within normal limits.  Right Kidney: Length: 11.8 cm. Renal parenchyma shows diffusely increased echogenicity. No hydronephrosis.  Left Kidney: Length: 11.0 cm. Increased echogenicity. No hydronephrosis.  Abdominal aorta: Atherosclerotic wall irregularity is evident.  Other findings: Small bilateral pleural effusions are evident. There is a small amount of fluid around the liver and in the  right paracolic gutter.  IMPRESSION: Subtle nodularity of the liver contour raises the question of cirrhosis. No intrahepatic focal mass lesion.  Borderline gallbladder wall thickening without gallstones or pericholecystic fluid. Extrahepatic common duct is mildly distended although no associated intrahepatic biliary duct dilatation is evident. Gallbladder wall thickening may be secondary to hypoproteinemia or systemic disease. If there is clinical concern for cholecystitis, nuclear scintigraphy may prove helpful to further evaluate.  Small pleural effusions with small volume ascites.  Echogenic kidneys compatible with medical renal disease.  Atherosclerosis.   Electronically Signed   By: Misty Stanley M.D.   On: 12/05/2014 12:23   US Renal  12/03/2014   CLINICAL DATA:  Diminished urine output. Acute kidney injury. Current history of diabetes and hypertension.  EXAM: RENAL/URINARY TRACT ULTRASOUND COMPLETE  COMPARISON:  None.  FINDINGS: Right Kidney:  Length: Approximately 10.7 cm. Echogenic parenchyma. No hydronephrosis. 1.0 cm cyst arising from the lower pole. No significant focal parenchymal abnormality.  Left Kidney:  Length: Approximately 10.0 cm. Echogenic parenchyma. No hydronephrosis. No focal parenchymal abnormality.  Bladder:  Appears normal for degree of bladder distention.  Other findings:  Small amount of ascites in the right upper quadrant, left upper quadrant and in the left lower quadrant. Small bilateral pleural effusions.  IMPRESSION: 1. No evidence of hydronephrosis involving either kidney. 2. Echogenic parenchyma involving both kidneys consistent with medical renal disease. 3. Small amount of ascites. 4. Small bilateral pleural effusions.   Electronically Signed   By: Evangeline Dakin M.D.   On: 12/03/2014 13:55   Nm Pulmonary Perf And Vent  12/05/2014   CLINICAL DATA:  Shortness of breath and pulmonary hypertension  EXAM: NUCLEAR MEDICINE VENTILATION - PERFUSION LUNG SCAN  TECHNIQUE:  Ventilation images were obtained in multiple projections using inhaled aerosol technetium 99 M DTPA. Perfusion images were obtained in multiple projections after intravenous injection of Tc-10mMAA.  RADIOPHARMACEUTICALS:  Forty mCi Tc-980mTPA aerosol and 6 mCi Tc-9934mA  COMPARISON:  None.  FINDINGS: Ventilation: No focal ventilation defect. Some free technetium is noted within the stomach as well as trapping centrally in the left the lung.  Perfusion: No wedge shaped peripheral perfusion defects to suggest acute pulmonary embolism.  IMPRESSION: No evidence of pulmonary embolism.   Electronically Signed   By: MarInez CatalinaD.   On: 12/05/2014 16:36     PERTINENT LAB RESULTS: CBC:  Recent Labs  12/07/14 0742  WBC 11.9*  HGB 12.8  HCT 41.9  PLT 227   CMET CMP     Component Value Date/Time   NA 140 12/07/2014 0742   K 4.5 12/07/2014 0742   CL 104 12/07/2014 0742   CO2 27 12/07/2014 0742   GLUCOSE 115* 12/07/2014 0742   BUN 24* 12/07/2014 0742   CREATININE 1.54* 12/07/2014 0742   CALCIUM 8.3* 12/07/2014 0742   PROT 6.2 12/03/2014 1150   ALBUMIN 2.9* 12/03/2014 1150   AST 69* 12/03/2014 1150   ALT 51* 12/03/2014 1150   ALKPHOS 115 12/03/2014 1150   BILITOT 0.8 12/03/2014 1150   GFRNONAA 35* 12/07/2014 0742   GFRAA 41* 12/07/2014 0742    GFR Estimated Creatinine Clearance: 33.5 mL/min (by C-G formula based on Cr of 1.54). No results for input(s): LIPASE, AMYLASE in the last 72 hours.  Recent Labs  12/04/14 1640  TROPONINI 0.11*   Invalid input(s): POCBNP No results for input(s): DDIMER in the last 72 hours. No results for input(s): HGBA1C in the last 72 hours. No results for input(s): CHOL, HDL, LDLCALC, TRIG, CHOLHDL, LDLDIRECT in the last 72 hours. No results for input(s): TSH, T4TOTAL, T3FREE, THYROIDAB in the last 72 hours.  Invalid input(s): FREET3 No results for input(s): VITAMINB12, FOLATE, FERRITIN, TIBC, IRON, RETICCTPCT in the last 72 hours. Coags: No  results for input(s): INR in the last 72 hours.  Invalid input(s): PT Microbiology: Recent Results (from the past 240 hour(s))  Urine culture     Status: None   Collection Time: 12/04/14  1:08 AM  Result Value Ref Range Status   Specimen Description URINE, CATHETERIZED  Final   Special Requests NONE  Final   Colony Count NO GROWTH [facility]formed [facility]   Culture NO GROWTH [facility]formed at [facility]   Report Status 12/06/2014 FINAL  Final  Surgical pcr screen     Status: None   Collection Time: 12/05/14  6:07 PM  Result Value Ref Range Status   MRSA, PCR NEGATIVE NEGATIVE Final   Staphylococcus aureus NEGATIVE NEGATIVE Final    Comment:  The Xpert SA Assay (FDA approved for NASAL specimens in patients over 33 years of age), is one component of a comprehensive surveillance program.  Test performance has been validated by [facility] for patients greater than or equal to 51 year old. It is not intended to diagnose infection nor to guide or monitor treatment.      BRIEF HOSPITAL COURSE:  Severe pulmonary hypertension with right heart failure and cor pulmonale Suspect severe PAH due to chronic hypoxia in setting of severe COPD and OSA.Patient presented with significant lower extremity edema, increasing abdominal girth. 2-D echocardiogram showed preserved ejection fraction with a pulmonary artery pressure of 71 and moderate RV dictation. Cardiology was consulted, patient underwent right heart catheterization which showed severe PTH with normal PCWP. She was diagnosed and is significantly better. VQ scan was negative for pulmonary embolism. Current recommendations are to discharge home with Demadex 20 mg twice daily. Follow-up appointment with CHF clinic has been arranged. Patient was also seen by pulmonology during this hospital stay, she will need outpatient PFTs and further workup at the discretion of her pulmonologist.  Acute renal  failure: Likely prerenal azotemia in the setting of heart failure. Improving with diuretics. Creatinine peaked at 2.8 for this admission, by day of discharge, creatinine improved to 1.54. Please continue to monitor electrolytes closely. Renal ultrasound was negative for hydronephrosis.  Hypotension: Likely secondary to medications. Significantly improved with adjustment in her diuretic regimen. A normotensive at the time of discharge.  Acute on chronic hypoxic respiratory failure: Likely secondary to undiagnosed COPD, OSA and hypertension. Placed on oxygen, will require home O2, have asked case management to arrange prior to discharge.  Elevated troponin in setting of renal failure:doubt ACS. No further workup recommended.  Chronic pain: Continue methadone and Neurontin on discharge.  Type 2 diabetes: Metformin and Amaryl as an outpatient, developed hypoglycemia in the setting of worsening renal failure. Both Amaryl and metformin have been discontinued, will switch over to glipizide on discharge. She will follow her PCP for further optimization.   TODAY-DAY OF DISCHARGE:  Subjective:   Lindsay Harmon today has no headache,no chest abdominal pain,no new weakness tingling or numbness, feels much better wants to go home today.   Objective:   Blood pressure 121/78, pulse 97, temperature 97.6 F (36.4 C), temperature source Oral, resp. rate 18, height 5' 1" (1.549 m), weight 66.588 kg (146 lb 12.8 oz), SpO2 90 %.  Intake/Output Summary (Last 24 hours) at 12/07/14 1119 Last data filed at 12/07/14 1000  Gross per 24 hour  Intake    462 ml  Output   1250 ml  Net   -788 ml   Filed Weights   12/03/14 1123 12/04/14 0525 12/07/14 0500  Weight: 64.864 kg (143 lb) 66.3 kg (146 lb 2.6 oz) 66.588 kg (146 lb 12.8 oz)    Exam Awake Alert, Oriented *3, No new F.N deficits, Normal affect [place].AT,PERRAL Supple Neck,No JVD, No cervical lymphadenopathy appriciated.  Symmetrical Chest wall movement,  Good air movement bilaterally, CTAB RRR,No Gallops,Rubs or new Murmurs, No Parasternal Heave +ve B.Sounds, Abd Soft, Non tender, No organomegaly appriciated, No rebound -guarding or rigidity. No Cyanosis, Clubbing or edema, No new Rash or bruise  DISCHARGE CONDITION: Stable  DISPOSITION: Home with home health services  DISCHARGE INSTRUCTIONS:    Activity:  As tolerated with Full fall precautions use walker/cane & assistance as needed  Diet recommendation: Diabetic Diet Heart Healthy diet   Discharge Instructions    Beta Blocker already ordered  Complete by:  As directed      Call MD for:  difficulty breathing, headache or visual disturbances    Complete by:  As directed      Contraindication to ACEI at discharge    Complete by:  As directed   ARF     Diet - low sodium heart healthy    Complete by:  As directed      Diet Carb Modified    Complete by:  As directed      Heart Failure patients record your daily weight using the same scale at the same time of day    Complete by:  As directed      Increase activity slowly    Complete by:  As directed      STOP any activity that causes chest pain, shortness of breath, dizziness, sweating, or exessive weakness    Complete by:  As directed            Follow-up Information    Follow up with Simonne Maffucci, MD On 01/05/2015.   Specialty:  Pulmonary Disease   Why:  Appt at 3:15 PM at [facility]    Contact information:   [place] [address] [phone]       Follow up with Glori Bickers, MD On 12/14/2014.   Specialty:  Cardiology   Why:  at 2:40 in the [facility]--gate code 0700--please bring all medications   Contact information:   [place] [address] [phone]       Follow up with Viviana Simpler, MD On 12/13/2014.   Specialties:  Internal Medicine, Pediatrics   Why:  Appointment with Dr. Silvio Pate is on 12/13/14 at [facility]  information:   [place] [address] [phone]      Total Time spent on discharge equals 45 minutes.  SignedOren Binet 12/07/2014 11:19 AM

## 2014-12-07 NOTE — Progress Notes (Signed)
Physical Therapy Treatment Patient Details Name: Lindsay Harmon MRN: [id number] DOB: [birth year] Today's Date: 12/07/2014    History of Present Illness Lindsay Harmon is 59 y/o female who came to the ER where workup was a digestive of acute on chronic renal failure, she had evidence of 1-2+ edema, elevated JVD and I was called to admit the patient. Patient denies any headache, no fever or chills, no chest pain, she has no shortness of breath orthopnea, denies any belly pain or diarrhea, no focal weakness. She does complain of weight gain, edema in her legs, over the last 7-10 days    Lindsay Harmon Comments    Has progressed enough to be safe at home on home O2 with some available assist of family.  Follow Up Recommendations  Home health Lindsay Harmon     Equipment Recommendations  None recommended by Lindsay Harmon    Recommendations for Other Services       Precautions / Restrictions Precautions Precautions: Fall Restrictions Weight Bearing Restrictions: No    Mobility  Bed Mobility Overal bed mobility: Independent                Transfers Overall transfer level: Modified independent     Sit to Stand: Modified independent (Device/Increase time)            Ambulation/Gait Ambulation/Gait assistance: Supervision Ambulation Distance (Feet): 200 Feet Assistive device:  (pulled portable O2) Gait Pattern/deviations: Step-through pattern Gait velocity: slower Gait velocity interpretation: Below normal speed for age/gender General Gait Details: steadier gait, able to manage pulling the O2 tank.   Stairs Stairs: Yes Stairs assistance: Supervision Stair Management: One rail Right;Step to pattern;Forwards Number of Stairs: 5 General stair comments: safe with rail  Wheelchair Mobility    Modified Rankin (Stroke Patients Only)       Balance Overall balance assessment: Needs assistance Sitting-balance support: No upper extremity supported Sitting balance-Leahy Scale: Good     Standing balance  support: No upper extremity supported Standing balance-Leahy Scale: Fair                      Cognition Arousal/Alertness: Awake/alert Behavior During Therapy: WFL for tasks assessed/performed Overall Cognitive Status: Within Functional Limits for tasks assessed                      Exercises      General Comments General comments (skin integrity, edema, etc.): see qualification note--sats on RA at rest 78% with HR 105.  Sats with amb. on 4-6 L dropped as low as 79% with EHR 110's bpm.  Assymptomatic with noticeably blue lips.      Pertinent Vitals/Pain Pain Assessment: No/denies pain    Home Living                      Prior Function            Lindsay Harmon Goals (current goals can now be found in the care plan section) Acute Rehab Lindsay Harmon Goals Patient Stated Goal: be able to stay by myself Lindsay Harmon Goal Formulation: With patient Time For Goal Achievement: 12/13/14 Potential to Achieve Goals: Good Progress towards Lindsay Harmon goals: Progressing toward goals    Frequency  Min 3X/week    Lindsay Harmon Plan Current plan remains appropriate    Co-evaluation             End of Session Equipment Utilized During Treatment: Oxygen Activity Tolerance: Patient tolerated treatment well Patient left: in bed;with call bell/phone within reach;with  family/visitor present     Time: 8003-4917 Lindsay Harmon Time Calculation (min) (ACUTE ONLY): 41 min  Charges:  $Gait Training: 8-22 mins $Therapeutic Activity: 23-37 mins                    G Codes:      Mackson Botz, Tessie Fass 12/07/2014, 12:37 PM 12/07/2014  Donnella Sham, Lindsay Harmon [phone]  (pager)

## 2014-12-07 NOTE — Progress Notes (Signed)
SATURATION QUALIFICATIONS: (This note is used to comply with regulatory documentation for home oxygen)  Patient Saturations on Room Air at Rest = 78%  Patient Saturations on Room Air while Ambulating = NA%  Patient Saturations on 4 Liters of oxygen while Ambulating = 79%: 6L =87%  Please briefly explain why patient needs home oxygen:EHR in the 110's during ambulation.  Pt can maintain adequate saturations at rest on supplemental oxygen.  She can do much more activity before the saturations drop too far with the use of supplemental oxygen. 12/07/2014  Donnella Sham, [place] [phone]  (pager)

## 2014-12-07 NOTE — Progress Notes (Signed)
Discharge packet and information given to patient. Heart Failure packet education given to patient using explanation, visuals and teach back. All questions answered.

## 2014-12-08 ENCOUNTER — Other Ambulatory Visit: Payer: Commercial Managed Care - HMO

## 2014-12-09 ENCOUNTER — Telehealth: Payer: Self-pay | Admitting: Pulmonary Disease

## 2014-12-09 DIAGNOSIS — G4733 Obstructive sleep apnea (adult) (pediatric): Secondary | ICD-10-CM

## 2014-12-09 NOTE — Telephone Encounter (Signed)
Spoke with Velna Hatchet, states pt needs split night study.  Pt has appt with BQ on 01/05/15.  BQ can I order this under your name, or does it have to be one of the sleep docs?  Thanks!

## 2014-12-10 NOTE — Telephone Encounter (Signed)
Yes, OK by me 

## 2014-12-12 NOTE — Telephone Encounter (Signed)
Order placed to [facility]. Nothing more needed at this time.

## 2014-12-12 NOTE — Addendum Note (Signed)
Addended by: Lorane Gell on: 12/12/2014 02:14 PM   Modules accepted: Orders

## 2014-12-13 ENCOUNTER — Telehealth: Payer: Self-pay | Admitting: Internal Medicine

## 2014-12-13 ENCOUNTER — Encounter: Payer: Self-pay | Admitting: Internal Medicine

## 2014-12-13 ENCOUNTER — Ambulatory Visit (INDEPENDENT_AMBULATORY_CARE_PROVIDER_SITE_OTHER): Payer: Commercial Managed Care - HMO | Admitting: Internal Medicine

## 2014-12-13 VITALS — BP 110/62 | HR 68 | Temp 98.4°F | Resp 16 | Ht 61.0 in | Wt 139.5 lb

## 2014-12-13 DIAGNOSIS — N183 Chronic kidney disease, stage 3 unspecified: Secondary | ICD-10-CM

## 2014-12-13 DIAGNOSIS — I270 Primary pulmonary hypertension: Secondary | ICD-10-CM

## 2014-12-13 DIAGNOSIS — I5032 Chronic diastolic (congestive) heart failure: Secondary | ICD-10-CM

## 2014-12-13 DIAGNOSIS — I272 Pulmonary hypertension, unspecified: Secondary | ICD-10-CM

## 2014-12-13 DIAGNOSIS — R188 Other ascites: Secondary | ICD-10-CM

## 2014-12-13 DIAGNOSIS — N179 Acute kidney failure, unspecified: Secondary | ICD-10-CM

## 2014-12-13 LAB — CBC WITH DIFFERENTIAL/PLATELET
BASOS ABS: 0 10*3/uL (ref 0.0–0.1)
BASOS PCT: 0.4 % (ref 0.0–3.0)
EOS ABS: 0.2 10*3/uL (ref 0.0–0.7)
EOS PCT: 2.5 % (ref 0.0–5.0)
HCT: 45.7 % (ref 36.0–46.0)
Hemoglobin: 14.1 g/dL (ref 12.0–15.0)
LYMPHS PCT: 23.8 % (ref 12.0–46.0)
Lymphs Abs: 2 10*3/uL (ref 0.7–4.0)
MCHC: 30.8 g/dL (ref 30.0–36.0)
MCV: 78.6 fl (ref 78.0–100.0)
Monocytes Absolute: 0.6 10*3/uL (ref 0.1–1.0)
Monocytes Relative: 7.4 % (ref 3.0–12.0)
NEUTROS PCT: 65.9 % (ref 43.0–77.0)
Neutro Abs: 5.5 10*3/uL (ref 1.4–7.7)
Platelets: 260 10*3/uL (ref 150.0–400.0)
RBC: 5.81 Mil/uL — AB (ref 3.87–5.11)
RDW: 20.5 % — ABNORMAL HIGH (ref 11.5–15.5)
WBC: 8.3 10*3/uL (ref 4.0–10.5)

## 2014-12-13 LAB — COMPREHENSIVE METABOLIC PANEL
ALT: 22 U/L (ref 0–35)
AST: 20 U/L (ref 0–37)
Albumin: 3.6 g/dL (ref 3.5–5.2)
Alkaline Phosphatase: 164 U/L — ABNORMAL HIGH (ref 39–117)
BUN: 18 mg/dL (ref 6–23)
CHLORIDE: 98 meq/L (ref 96–112)
CO2: 39 meq/L — AB (ref 19–32)
Calcium: 9.3 mg/dL (ref 8.4–10.5)
Creatinine, Ser: 1.44 mg/dL — ABNORMAL HIGH (ref 0.40–1.20)
GFR: 39.33 mL/min — AB (ref >60.00)
GLUCOSE: 56 mg/dL — AB (ref 70–99)
POTASSIUM: 4.3 meq/L (ref 3.5–5.1)
Sodium: 140 mEq/L (ref 135–145)
Total Bilirubin: 0.8 mg/dL (ref 0.2–1.2)
Total Protein: 7.5 g/dL (ref 6.0–8.3)

## 2014-12-13 NOTE — Telephone Encounter (Signed)
Pt called after coming in today and is interested in getting a [employer] parking permit.  Pt wants to know how to complete that process.  Please call her back at [phone].

## 2014-12-13 NOTE — Assessment & Plan Note (Signed)
May well be the primary process Symptoms are better since she diuresed in hospital Now filling with fluid in legs again--but not the fatigue or dyspnea. Weight still decreasing Will recheck labs---may need increased diuretics again

## 2014-12-13 NOTE — Assessment & Plan Note (Signed)
Recent EF 60% This may be contributing to the pulmonary hypertension

## 2014-12-13 NOTE — Assessment & Plan Note (Signed)
This is clearly better Based on ultrasound, she does have some element of cirrhosis, but it seems to be a secondary process

## 2014-12-13 NOTE — Assessment & Plan Note (Signed)
This improved in the hospital Will recheck today If stable, may need to increase the diuretic --has appt with cardiology tomorrow

## 2014-12-13 NOTE — Progress Notes (Signed)
Subjective:    Patient ID: Lindsay Harmon, female    DOB: [birth year], 59 y.o.   MRN: [id number]  HPI Here for hospital follow up Doing better now Fluid is mostly out of legs--- as long as she keeps them elevated She feels the abdominal fluid is better  Breathing is better No instrumental ADLs--mom lives right next door Using the oxygen all the time This is her first time out Awaiting therapist visits at home--just had initial visit today. Starting therapy next week officially  No chest pain No palpitations No dizziness or syncope Still sleeping in recliner--- mostly flat. No PND  Current Outpatient Prescriptions on File Prior to Visit  Medication Sig Dispense Refill  . ACCU-CHEK FASTCLIX LANCETS MISC Use to check blood sugar once daily dx: E11.49 100 each 3  . ALPRAZolam (XANAX) 0.25 MG tablet TAKE ONE (1) TABLET BY MOUTH EVERY DAY 90 tablet 0  . aspirin 325 MG tablet Take 325 mg by mouth daily.      . feeding supplement, ENSURE COMPLETE, (ENSURE COMPLETE) LIQD Take 237 mLs by mouth 2 (two) times daily between meals. 60 Bottle 0  . gabapentin (NEURONTIN) 300 MG capsule TAKE 1 CAPSULE FOUR TIMES DAILY 360 capsule 3  . glipiZIDE (GLUCOTROL) 5 MG tablet Take 1 tablet (5 mg total) by mouth daily before breakfast. 30 tablet 0  . glucose blood (ACCU-CHEK AVIVA PLUS) test strip Use as instructed to check blood sugar once daily dx: E11.49 100 each 3  . loratadine (CLARITIN) 10 MG tablet Take 10 mg by mouth 2 (two) times daily.     Marland Kitchen lovastatin (MEVACOR) 40 MG tablet TAKE 2 TABLETS EVERY DAY 180 tablet 3  . methadone (DOLOPHINE) 10 MG tablet Take 2 tablets four times a day. 240 tablet 0  . metoprolol tartrate (LOPRESSOR) 25 MG tablet Take 0.5 tablets (12.5 mg total) by mouth 2 (two) times daily. 60 tablet 0  . mirtazapine (REMERON) 30 MG tablet TAKE 1/2 TO 1 TABLET AT BEDTIME 90 tablet 3  . nitroGLYCERIN (NITROSTAT) 0.4 MG SL tablet Place 1 tablet (0.4 mg total) under the tongue every 5  (five) minutes as needed. 60 tablet 1  . pantoprazole (PROTONIX) 40 MG tablet Take 1 tablet (40 mg total) by mouth 2 (two) times daily. 180 tablet 3  . torsemide (DEMADEX) 20 MG tablet Take 1 tablet (20 mg total) by mouth 2 (two) times daily. 60 tablet 0   No current facility-administered medications on file prior to visit.    No Known Allergies  Past Medical History  Diagnosis Date  . Depression   . GERD (gastroesophageal reflux disease)   . Hyperlipidemia   . Hypertension   . PVD (peripheral vascular disease)   . CAD (coronary artery disease)   . Familial hematuria   . NIDDM (non-insulin dependent diabetes mellitus)     with neuropathy  . IBS (irritable bowel syndrome)   . Esophageal stricture   . Obesity, unspecified   . Nephrolithiasis   . Allergy   . Arthritis     Past Surgical History  Procedure Laterality Date  . Abdominal hysterectomy    . Cesarean section    . Carpal tunnel release    . Right heart catheterization N/A 12/05/2014    Procedure: RIGHT HEART CATH;  Surgeon: Sinclair Grooms, MD;  Location: [facility] CATH LAB;  Service: Cardiovascular;  Laterality: N/A;    Family History  Problem Relation Age of Onset  . Diabetes Mother  History   Social History  . Marital Status: Married    Spouse Name: N/A  . Number of Children: 1  . Years of Education: N/A   Occupational History  . 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 Smoking status: Former Research scientist (life sciences)  . Smokeless tobacco: Never Used  . Alcohol Use: No     Comment: heavy in the past  . Drug Use: No  . Sexual Activity: No   Other Topics Concern  . Not on file   Social History Narrative   No living will   Son [name] should make decisions for her if she is unable.    Would accept resuscitation attempts but no prolonged ventilation   Not sure about tube feeds but probably wouldn't want them if cognitively unaware   Review of Systems Appetite is good No N/V Bowels are  okay---look normal (or just slightly lighter)    Objective:   Physical Exam  Constitutional: She appears well-developed and well-nourished. No distress.  Neck: Normal range of motion. Neck supple.  Cardiovascular: Normal rate, regular rhythm and normal heart sounds.  Exam reveals no gallop.   No murmur heard. Pulmonary/Chest: Effort normal and breath sounds normal. No respiratory distress. She has no wheezes. She has no rales.  Abdominal: Soft. She exhibits no distension. There is no tenderness.  No apparent ascites today  Musculoskeletal:  2+ tense edema in calves still  Lymphadenopathy:    She has no cervical adenopathy.  Psychiatric: She has a normal mood and affect. Her behavior is normal.          Assessment & Plan:

## 2014-12-13 NOTE — Progress Notes (Signed)
Pre visit review using our clinic review tool, if applicable. No additional management support is needed unless otherwise documented below in the visit note.

## 2014-12-14 ENCOUNTER — Encounter (HOSPITAL_COMMUNITY): Payer: Self-pay

## 2014-12-14 ENCOUNTER — Ambulatory Visit: Payer: Commercial Managed Care - HMO | Admitting: Internal Medicine

## 2014-12-14 ENCOUNTER — Ambulatory Visit (HOSPITAL_COMMUNITY)
Admit: 2014-12-14 | Discharge: 2014-12-14 | Disposition: A | Discharge status: Home / Self Care | Payer: Commercial Managed Care - HMO | Source: Ambulatory Visit | Attending: Adult Health | Admitting: Adult Health

## 2014-12-14 VITALS — BP 151/63 | HR 90 | Resp 20 | Wt 138.0 lb

## 2014-12-14 DIAGNOSIS — I5032 Chronic diastolic (congestive) heart failure: Secondary | ICD-10-CM

## 2014-12-14 DIAGNOSIS — J449 Chronic obstructive pulmonary disease, unspecified: Secondary | ICD-10-CM

## 2014-12-14 DIAGNOSIS — I270 Primary pulmonary hypertension: Secondary | ICD-10-CM

## 2014-12-14 DIAGNOSIS — J9611 Chronic respiratory failure with hypoxia: Secondary | ICD-10-CM

## 2014-12-14 DIAGNOSIS — I272 Pulmonary hypertension, unspecified: Secondary | ICD-10-CM

## 2014-12-14 MED ORDER — TORSEMIDE 20 MG PO TABS: 40.0000 mg | ORAL_TABLET | Freq: Two times a day (BID) | ORAL | Status: DC

## 2014-12-14 NOTE — Telephone Encounter (Signed)
She needs to go to the [employer] office (the one by [address]) to get the form. Then I can fill it out for her.

## 2014-12-14 NOTE — Telephone Encounter (Signed)
Spoke to pt and advised, along with labs. When sending message, pls ensure that you are in the correct chart linked to pt.

## 2014-12-14 NOTE — Patient Instructions (Signed)
INCREASE Torsemide to 40 mg twice a day  [place] will recheck labs next week  Your physician recommends that you schedule a follow-up appointment in: 2 weeks  Do the following things EVERYDAY: 1) Weigh yourself in the morning before breakfast. Write it down and keep it in a log. 2) Take your medicines as prescribed 3) Eat low salt foods-Limit salt (sodium) to 2000 mg per day.  4) Stay as active as you can everyday 5) Limit all fluids for the day to less than 2 liters 6)

## 2014-12-14 NOTE — Progress Notes (Signed)
Patient ID: Lindsay Harmon, female   DOB: 27-Aug-1956, 59 Lindsay.o.   MRN: [id number] PCP: Primary Cardiologist:  HPI: Ms Lindsay Harmon is a 59 year old with history of HTN, DM, IBS, depression, GERD, and pulmonary hypertension --?hypoxia OSA with admit to [facility] in [place] with volume overload and worsening renal function. She was diuresed with IV lasix and transitioned to torsemide. She also had RHC as noted below as well as PFTs.   She returns for post hospital follow up. Mild dyspnea with exertion. Sleeps in a recliner chronically due to her back issues. Poor appetite. Weight at home 144 down to 138 pounds. She remains on 4 liters oxygen at rest and 6 liters with exertion. Has home health RN, PT,  and OT. Taking all medications. Lives alone. Following low salt diet and limiting fluid intake to < 2 liters per day. Drinking gatorade.    RHC 12/05/2014 RA 12 mmHg (mean) with O2 sat 72%; RV 97/16 mmHg with O2 sat 73%; PA 97/30 mmHg with O2 sat 67%; PCWP(mean) 14 mmHg (mean); Cardiac Output 5.78 L/min  PFTs 2/92016 with severe COPD  FEV1 0.87 (38%) FVC 1.41 (48%) FEF 25-75 0.41 (19%) Unable to do DLCO   Labs 12/13/2014: K 4.3 Creatinine 1.44    ROS: All systems negative except as listed in HPI, PMH and Problem List.  SH:  History   Social History  . Marital Status: Married    Spouse Name: N/A  . Number of Children: 1  . Years of Education: N/A   Occupational History  . 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 Smoking status: Former Research scientist (life sciences)  . Smokeless tobacco: Never Used  . Alcohol Use: No     Comment: heavy in the past  . Drug Use: No  . Sexual Activity: No   Other Topics Concern  . Not on file   Social History Narrative   No living will   Lindsay Harmon should make decisions for her if she is unable.    Would accept resuscitation attempts but no prolonged ventilation   Not sure about tube feeds but probably wouldn't want them if cognitively unaware     FH:  Family History  Problem Relation Age of Onset  . Diabetes Mother     Past Medical History  Diagnosis Date  . Depression   . GERD (gastroesophageal reflux disease)   . Hyperlipidemia   . Hypertension   . PVD (peripheral vascular disease)   . CAD (coronary artery disease)   . Familial hematuria   . NIDDM (non-insulin dependent diabetes mellitus)     with neuropathy  . IBS (irritable bowel syndrome)   . Esophageal stricture   . Obesity, unspecified   . Nephrolithiasis   . Allergy   . Arthritis     Current Outpatient Prescriptions  Medication Sig Dispense Refill  . ACCU-CHEK FASTCLIX LANCETS MISC Use to check blood sugar once daily dx: E11.49 100 each 3  . ALPRAZolam (XANAX) 0.25 MG tablet TAKE ONE (1) TABLET BY MOUTH EVERY DAY 90 tablet 0  . aspirin 325 MG tablet Take 325 mg by mouth daily.      . feeding supplement, ENSURE COMPLETE, (ENSURE COMPLETE) LIQD Take 237 mLs by mouth 2 (two) times daily between meals. 60 Bottle 0  . gabapentin (NEURONTIN) 300 MG capsule TAKE 1 CAPSULE FOUR TIMES DAILY 360 capsule 3  . glucose blood (ACCU-CHEK AVIVA PLUS) test strip Use as instructed to check  blood sugar once daily dx: E11.49 100 each 3  . loratadine (CLARITIN) 10 MG tablet Take 10 mg by mouth 2 (two) times daily.     Marland Kitchen lovastatin (MEVACOR) 40 MG tablet TAKE 2 TABLETS EVERY DAY 180 tablet 3  . metFORMIN (GLUCOPHAGE) 1000 MG tablet Take 500 mg by mouth 2 (two) times daily with a meal.    . methadone (DOLOPHINE) 10 MG tablet Take 2 tablets four times a day. 240 tablet 0  . metoprolol tartrate (LOPRESSOR) 25 MG tablet Take 0.5 tablets (12.5 mg total) by mouth 2 (two) times daily. 60 tablet 0  . mirtazapine (REMERON) 30 MG tablet TAKE 1/2 TO 1 TABLET AT BEDTIME 90 tablet 3  . nitroGLYCERIN (NITROSTAT) 0.4 MG SL tablet Place 1 tablet (0.4 mg total) under the tongue every 5 (five) minutes as needed. 60 tablet 1  . pantoprazole (PROTONIX) 40 MG tablet Take 1 tablet (40 mg total) by  mouth 2 (two) times daily. 180 tablet 3  . torsemide (DEMADEX) 20 MG tablet Take 1 tablet (20 mg total) by mouth 2 (two) times daily. 60 tablet 0   No current facility-administered medications for this encounter.    Filed Vitals:   12/14/14 1447  BP: 151/63  Pulse: 99  Resp: 20  Weight: 138 lb (62.596 kg)  SpO2: 93%    PHYSICAL EXAM:  General: Chronically ill appearing. No resp difficulty. Lindsay Harmon present HEENT: normal Neck: supple. JVP to jaw.  Carotids 2+ bilat; no bruits. No lymphadenopathy or thryomegaly appreciated. Cor: PMI nondisplaced. regular . No rubs, gallops or murmurs. Prominent s2 Lungs: decreased throughout Abdomen: soft, nontender, nondistended. No hepatosplenomegaly. No bruits or masses. Good bowel sounds. Extremities: no cyanosis, + clubbing, R and LLE 2-3+  edema Neuro: alert & orientedx3, cranial nerves grossly intact. moves all 4 extremities w/o difficulty. Affect pleasant   ASSESSMENT & PLAN: 1. Chronic Diastolic HF -NYHA IIIb. Volume status elevated. Increase torsemide to 40 mg twice a day.Reviewed BMET from 61/16/15. Renal function was ok.  Instructed to stop drinking gatorade.  Reinforced low salt diet and daily weights.    2. COPD- PFTs FEV1 0.87 (38%), FVC 1.41 (48%), FEF 25-75 0.41 (19%) Unable to do DLCO. Remains on 4 liters oxygen at rest and 6 liters with exertion. Has follow up with pulmonary next month with [name]  3. Suspected OSA- has sleep study in April   4. Severe PAH thought to be from chronic hypoxia in the setting of COPD /OSA.  WHO Group III. Has sleep study as above. Not a candidate for selective pulmonary vasodilators.   Follow up in 2 weeks to reassess volume status. Check BMET at that time.   CLEGG,AMY NP-C  3:55 PM

## 2014-12-15 DIAGNOSIS — J9611 Chronic respiratory failure with hypoxia: Secondary | ICD-10-CM | POA: Insufficient documentation

## 2014-12-15 DIAGNOSIS — J449 Chronic obstructive pulmonary disease, unspecified: Secondary | ICD-10-CM | POA: Insufficient documentation

## 2014-12-16 ENCOUNTER — Telehealth: Payer: Self-pay | Admitting: Internal Medicine

## 2014-12-16 NOTE — Telephone Encounter (Signed)
Spoke with Lindsay Harmon. Would increase metorpolol to 11m twice daily. Ensure she's taking torsemide 455mbid as per cardiology note Wed.  [place]. HR today was 75.

## 2014-12-16 NOTE — Telephone Encounter (Signed)
Lindsay [facility] called to let you know that Lindsay Harmon bp is very high 166/75  Pt told Lindsay Harmon that her bp has always normal with her meds and since is has be out of hospital it is running high  with meds Lindsay Harmon stated all other vital within normal limit

## 2014-12-19 ENCOUNTER — Telehealth: Payer: Self-pay | Admitting: Internal Medicine

## 2014-12-19 NOTE — Telephone Encounter (Signed)
Esther from [facility] called to inform you of pt's blood pressure reading of 150/72. All other vitals were within normal limits.

## 2014-12-19 NOTE — Telephone Encounter (Signed)
That is fine Will have home health continue to monitor

## 2014-12-19 NOTE — Telephone Encounter (Signed)
That is fine now

## 2014-12-22 ENCOUNTER — Other Ambulatory Visit: Payer: Self-pay | Admitting: Internal Medicine

## 2014-12-22 ENCOUNTER — Other Ambulatory Visit: Payer: Self-pay

## 2014-12-22 MED ORDER — ALPRAZOLAM 0.25 MG PO TABS: ORAL_TABLET | ORAL | Status: DC

## 2014-12-22 NOTE — Telephone Encounter (Signed)
Approved: #90 x 0 

## 2014-12-22 NOTE — Telephone Encounter (Signed)
Xanax called into pharmacy per Dr Silvio Pate approval.

## 2014-12-22 NOTE — Telephone Encounter (Signed)
Refill request for xanax 0.31m.  Last seen 12/13/2014.  Last filled 09/27/2014.  Please advise.

## 2014-12-26 ENCOUNTER — Other Ambulatory Visit: Payer: Self-pay | Admitting: *Deleted

## 2014-12-26 NOTE — Telephone Encounter (Signed)
Patient called requesting a refill on her Methadone. Last refill 11/30/14 #240. Call when ready for pickup.

## 2014-12-27 DIAGNOSIS — I129 Hypertensive chronic kidney disease with stage 1 through stage 4 chronic kidney disease, or unspecified chronic kidney disease: Secondary | ICD-10-CM | POA: Diagnosis not present

## 2014-12-27 DIAGNOSIS — E1142 Type 2 diabetes mellitus with diabetic polyneuropathy: Secondary | ICD-10-CM | POA: Diagnosis not present

## 2014-12-27 DIAGNOSIS — N183 Chronic kidney disease, stage 3 (moderate): Secondary | ICD-10-CM | POA: Diagnosis not present

## 2014-12-27 DIAGNOSIS — I5023 Acute on chronic systolic (congestive) heart failure: Secondary | ICD-10-CM | POA: Diagnosis not present

## 2014-12-27 MED ORDER — METHADONE HCL 10 MG PO TABS: ORAL_TABLET | ORAL | Status: DC

## 2014-12-27 NOTE — Telephone Encounter (Signed)
Patient informed her methadone rx is ready for pick up at the office.

## 2014-12-28 ENCOUNTER — Telehealth: Payer: Self-pay

## 2014-12-28 ENCOUNTER — Encounter: Payer: Self-pay | Admitting: Internal Medicine

## 2014-12-28 NOTE — Telephone Encounter (Signed)
OT with [place] left v/m requesting verbal order for home health OT 2 x a week for 1 week.Please advise.

## 2014-12-29 ENCOUNTER — Encounter (HOSPITAL_COMMUNITY): Payer: Commercial Managed Care - HMO

## 2014-12-29 NOTE — Telephone Encounter (Signed)
Verbal given to continue therapy for patient.

## 2014-12-29 NOTE — Telephone Encounter (Signed)
That is fine 

## 2015-01-03 ENCOUNTER — Other Ambulatory Visit: Payer: Self-pay | Admitting: Internal Medicine

## 2015-01-05 ENCOUNTER — Ambulatory Visit (INDEPENDENT_AMBULATORY_CARE_PROVIDER_SITE_OTHER): Payer: Commercial Managed Care - HMO | Admitting: Pulmonary Disease

## 2015-01-05 ENCOUNTER — Encounter: Payer: Self-pay | Admitting: Pulmonary Disease

## 2015-01-05 VITALS — BP 132/64 | HR 76 | Ht 61.0 in | Wt 135.0 lb

## 2015-01-05 DIAGNOSIS — J449 Chronic obstructive pulmonary disease, unspecified: Secondary | ICD-10-CM

## 2015-01-05 DIAGNOSIS — I272 Pulmonary hypertension, unspecified: Secondary | ICD-10-CM

## 2015-01-05 DIAGNOSIS — J9611 Chronic respiratory failure with hypoxia: Secondary | ICD-10-CM

## 2015-01-05 DIAGNOSIS — I270 Primary pulmonary hypertension: Secondary | ICD-10-CM

## 2015-01-05 NOTE — Assessment & Plan Note (Signed)
COPD: GOLD Grade D severe airflow obstruction Combined recommendations from the [employer], [employer], Investment banker, corporate, European Respiratory Society (Qaseem A et al, Ann Intern Med. 2011;155(3):179) recommends tobacco cessation, pulmonary rehab (for symptomatic patients with an FEV1 < 50% predicted), supplemental oxygen (for patients with SaO2 <88% or paO2 <55), and appropriate bronchodilator therapy.  In regards to long acting bronchodilators, they recommend monotherapy (FEV1 60-80% with symptoms weak evidence, FEV1 with symptoms <60% strong evidence), or combination therapy (FEV1 <60% with symptoms, strong recommendation, moderate evidence).  One should also provide patients with annual immunizations and consider therapy for prevention of COPD exacerbations (ie. roflumilast or azithromycin) when appopriate.  -O2 therapy: 2 L at rest, 3 L with exertion -Immunizations: Up to date -Tobacco use: Quit 2009 -Exercise: Encouraged regular exercise, may benefit from pulmonary rehab -Bronchodilator therapy: Start Stiolto -Exacerbation prevention: Stiolto

## 2015-01-05 NOTE — Progress Notes (Signed)
Subjective:    Patient ID: Lindsay Harmon, female    DOB: 1956/09/15, 59 y.o.   MRN: [id number]  HPI Chief Complaint  Patient presents with  . Hospitalization Follow-up    Pt seen recently for pulmonary hypertension at [facility]. pt has no complaints today.     Lindsay Harmon was recently hospitalized for shortness of breath and volume overload and was found to have pulmonary hypertension and she has been referred to my clinic for evaluation of the same. She states that she smoked one to one and a half packs of cigarettes daily starting at age 87 and quit at age 29. Throughout her adult life she has never been told that she had a lung problem until recently. She had a normal childhood without respiratory illnesses.  Prior to her hospitalization she had significant shortness of breath and edema. During her stay she had a right heart catheterization performed which showed evidence of pulmonary hypertension with an only mildly elevated pulmonary capillary wedge pressure. She was discharged on high-dose diuretics and she has since lost 15 pounds and her breathing has improved significantly. She was discharged on 4 L nasal cannula at rest and 6 L with exertion.  She has undergone pulmonary function testing which showed severe airflow obstruction and evidence of air trapping. She has not been started on a bronchodilator. She is here to see [place] today in regards to her lung disease and pulmonary hypertension.  She tells me that she has been exercising with physical therapy on a routine basis but she does minimal cardiovascular work. She is not walking for exercise and she is not participating in a pulmonary or cardiac rehabilitation program. She says that she has measured her oxygen level at home and has been 100% on many occasions in the last week. She is instructed to increase her oxygen level to 6 L with exertion but she says that lately she has been keeping it at 4 L because she never felt any benefit with the  additional oxygen.    Past Medical History  Diagnosis Date  . Depression   . GERD (gastroesophageal reflux disease)   . Hyperlipidemia   . Hypertension   . PVD (peripheral vascular disease)   . CAD (coronary artery disease)   . Familial hematuria   . NIDDM (non-insulin dependent diabetes mellitus)     with neuropathy  . IBS (irritable bowel syndrome)   . Esophageal stricture   . Obesity, unspecified   . Nephrolithiasis   . Allergy   . Arthritis      Family History  Problem Relation Age of Onset  . Diabetes Mother   . Emphysema Paternal Grandfather   . Allergies Sister   . Allergies Father   . Cancer Mother     ovarian,melanoma  . Cancer Father     lung  . Cancer Maternal Grandmother     uterine     History   Social History  . Marital Status: Married    Spouse Name: N/A  . Number of Children: 1  . Years of Education: N/A   Occupational History  . 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 Smoking status: Former Smoker -- 1.50 packs/day for 20 years    Types: Cigarettes    Quit date: 01/04/2009  . Smokeless tobacco: Never Used  . Alcohol Use: No     Comment: heavy in the past  . Drug Use: No  . Sexual Activity:  No   Other Topics Concern  . Not on file   Social History Narrative   No living will   Son [name] should make decisions for her if she is unable.    Would accept resuscitation attempts but no prolonged ventilation   Not sure about tube feeds but probably wouldn't want them if cognitively unaware     No Known Allergies   Outpatient Prescriptions Prior to Visit  Medication Sig Dispense Refill  . ACCU-CHEK FASTCLIX LANCETS MISC Use to check blood sugar once daily dx: E11.49 100 each 3  . ALPRAZolam (XANAX) 0.25 MG tablet TAKE ONE (1) TABLET BY MOUTH EVERY DAY 90 tablet 0  . aspirin 325 MG tablet Take 325 mg by mouth daily.      . feeding supplement, ENSURE COMPLETE, (ENSURE COMPLETE) LIQD Take 237 mLs by mouth 2  (two) times daily between meals. 60 Bottle 0  . gabapentin (NEURONTIN) 300 MG capsule TAKE 1 CAPSULE FOUR TIMES DAILY 360 capsule 3  . glucose blood (ACCU-CHEK AVIVA PLUS) test strip Use as instructed to check blood sugar once daily dx: E11.49 100 each 3  . loratadine (CLARITIN) 10 MG tablet Take 10 mg by mouth 2 (two) times daily.     Marland Kitchen lovastatin (MEVACOR) 40 MG tablet TAKE 2 TABLETS EVERY DAY 180 tablet 3  . metFORMIN (GLUCOPHAGE) 1000 MG tablet Take 500 mg by mouth 2 (two) times daily with a meal.    . methadone (DOLOPHINE) 10 MG tablet Take 2 tablets four times a day. 240 tablet 0  . metoprolol tartrate (LOPRESSOR) 25 MG tablet Take 0.5 tablets (12.5 mg total) by mouth 2 (two) times daily. 60 tablet 0  . mirtazapine (REMERON) 30 MG tablet TAKE 1/2 TO 1 TABLET AT BEDTIME 90 tablet 3  . nitroGLYCERIN (NITROSTAT) 0.4 MG SL tablet Place 1 tablet (0.4 mg total) under the tongue every 5 (five) minutes as needed. 60 tablet 1  . pantoprazole (PROTONIX) 40 MG tablet TAKE 1 TABLET TWICE DAILY 180 tablet 3  . torsemide (DEMADEX) 20 MG tablet TAKE 2 TABLETS TWICE DAILY 360 tablet 1   No facility-administered medications prior to visit.       Review of Systems  Constitutional: Negative for fever and unexpected weight change.  HENT: Negative for congestion, dental problem, ear pain, nosebleeds, postnasal drip, rhinorrhea, sinus pressure, sneezing, sore throat and trouble swallowing.   Eyes: Negative for redness and itching.  Respiratory: Negative for cough, chest tightness, shortness of breath and wheezing.   Cardiovascular: Negative for palpitations and leg swelling.  Gastrointestinal: Negative for nausea and vomiting.  Genitourinary: Negative for dysuria.  Musculoskeletal: Negative for joint swelling.  Skin: Negative for rash.  Neurological: Negative for headaches.  Hematological: Does not bruise/bleed easily.  Psychiatric/Behavioral: Negative for dysphoric mood. The patient is not  nervous/anxious.        Objective:   Physical Exam Filed Vitals:   01/05/15 1515  BP: 132/64  Pulse: 76  Height: _0  (1.549 m)  Weight: 135 lb (61.236 kg)  SpO2: 97%   4L   Gen: chronically ill appearing, no acute distress HEENT: NCAT, PERRL, EOMi, OP clear, neck supple without masses PULM: Few crackles left base, otherwise clear CV: RRR, systolic murmur RUSB, no JVD AB: BS+, soft, nontender, no hsm Ext: warm, trace ankle edema, no clubbing, no cyanosis Derm: chronic venous stasis changes, clubbing toes Neuro: A&Ox4, CN II-XII intact, strength 5/5 in all 4 extremities  11/2014 RHC> Aortic pressure not recorded, with  O2 saturation 93%; LV pressure not recorded; LVEDP not recorded; RA 12 mmHg (mean) with O2 sat 72%; RV 97/16 mmHg with O2 sat 73%; PA 97/30 mmHg with O2 sat 67%; PCWP(mean) 14 mmHg (mean); Cardiac Output 5.78 L/min 11/2014 V/Q > Neg for PE     Assessment & Plan:   COPD (chronic obstructive pulmonary disease) COPD: GOLD Grade D severe airflow obstruction Combined recommendations from the [place], [employer] of [employer] Physicians, Investment banker, corporate, European Respiratory Society (Qaseem A et al, Ann Intern Med. 2011;155(3):179) recommends tobacco cessation, pulmonary rehab (for symptomatic patients with an FEV1 < 50% predicted), supplemental oxygen (for patients with SaO2 <88% or paO2 <55), and appropriate bronchodilator therapy.  In regards to long acting bronchodilators, they recommend monotherapy (FEV1 60-80% with symptoms weak evidence, FEV1 with symptoms <60% strong evidence), or combination therapy (FEV1 <60% with symptoms, strong recommendation, moderate evidence).  One should also provide patients with annual immunizations and consider therapy for prevention of COPD exacerbations (ie. roflumilast or azithromycin) when appopriate.  -O2 therapy: 2 L at rest, 3 L with exertion -Immunizations: Up to date -Tobacco use: Quit  2009 -Exercise: Encouraged regular exercise, may benefit from pulmonary rehab -Bronchodilator therapy: Start Stiolto -Exacerbation prevention: Stiolto    Pulmonary HTN Lindsay Harmon had severe pulmonary hypertension seen on a recent right heart catheterization which showed a normal pulmonary capillary wedge pressure at that time. However, she has severe COPD and so she has WHO class III disease and would not benefit from a pulmonary vasodilator. The best treatment for her pulmonary hypertension is adequate treatment of her underlying lung disease which will be difficult considering its severity. However, I believe that she has some degree of diastolic dysfunction which is improved dramatically with diuresis since the hospitalization. She has lost 15 pounds and says that she feels much better. Her oxygenation has improved.  Plan: -f/u sleep study result -Continue care for COPD as outlined below and congestive heart failure as per Dr. Jeffie Pollock   Chronic respiratory failure with hypoxia Today we measured her oxygen on room air and with ambulation and found that we were able to decrease it to 2 L at rest and 3 L with exertion.     Updated Medication List Outpatient Encounter Prescriptions as of 01/05/2015  Medication Sig  . ACCU-CHEK FASTCLIX LANCETS MISC Use to check blood sugar once daily dx: E11.49  . ALPRAZolam (XANAX) 0.25 MG tablet TAKE ONE (1) TABLET BY MOUTH EVERY DAY  . aspirin 325 MG tablet Take 325 mg by mouth daily.    . feeding supplement, ENSURE COMPLETE, (ENSURE COMPLETE) LIQD Take 237 mLs by mouth 2 (two) times daily between meals.  . gabapentin (NEURONTIN) 300 MG capsule TAKE 1 CAPSULE FOUR TIMES DAILY  . glucose blood (ACCU-CHEK AVIVA PLUS) test strip Use as instructed to check blood sugar once daily dx: E11.49  . loratadine (CLARITIN) 10 MG tablet Take 10 mg by mouth 2 (two) times daily.   Marland Kitchen lovastatin (MEVACOR) 40 MG tablet TAKE 2 TABLETS EVERY DAY  . metFORMIN (GLUCOPHAGE)  1000 MG tablet Take 500 mg by mouth 2 (two) times daily with a meal.  . methadone (DOLOPHINE) 10 MG tablet Take 2 tablets four times a day.  . metoprolol tartrate (LOPRESSOR) 25 MG tablet Take 0.5 tablets (12.5 mg total) by mouth 2 (two) times daily.  . mirtazapine (REMERON) 30 MG tablet TAKE 1/2 TO 1 TABLET AT BEDTIME  . nitroGLYCERIN (NITROSTAT) 0.4 MG SL tablet Place 1 tablet (  0.4 mg total) under the tongue every 5 (five) minutes as needed.  . pantoprazole (PROTONIX) 40 MG tablet TAKE 1 TABLET TWICE DAILY  . torsemide (DEMADEX) 20 MG tablet TAKE 2 TABLETS TWICE DAILY

## 2015-01-05 NOTE — Assessment & Plan Note (Signed)
Today we measured her oxygen on room air and with ambulation and found that we were able to decrease it to 2 L at rest and 3 L with exertion.

## 2015-01-05 NOTE — Patient Instructions (Signed)
Take the Stiolto inhaler two puffs daily no matter how you feel Take albuterol as needed for shortness of breath  For your oxygen: use ___Lpm at rest and ___ Lpm on exertion  We will see you back in 4-6 weeks or sooner if needed

## 2015-01-05 NOTE — Assessment & Plan Note (Addendum)
Lindsay Harmon had severe pulmonary hypertension seen on a recent right heart catheterization which showed a normal pulmonary capillary wedge pressure at that time. However, she has severe COPD and so she has WHO class III disease and would not benefit from a pulmonary vasodilator. The best treatment for her pulmonary hypertension is adequate treatment of her underlying lung disease which will be difficult considering its severity. However, I believe that she has some degree of diastolic dysfunction which is improved dramatically with diuresis since the hospitalization. She has lost 15 pounds and says that she feels much better. Her oxygenation has improved.  Plan: -f/u sleep study result -Continue care for COPD as outlined below and congestive heart failure as per Dr. Jeffie Pollock

## 2015-01-09 ENCOUNTER — Telehealth: Payer: Self-pay | Admitting: Pulmonary Disease

## 2015-01-09 MED ORDER — ALBUTEROL SULFATE HFA 108 (90 BASE) MCG/ACT IN AERS: 2.0000 | INHALATION_SPRAY | Freq: Four times a day (QID) | RESPIRATORY_TRACT | Status: DC | PRN

## 2015-01-09 NOTE — Telephone Encounter (Signed)
Patient was seen by Dr. Lake Bells on 01/05/15.  Patient says that she was suppose to get an albuterol inhaler but it was never called in.  According to Dr. Anastasia Pall OV notes, patient was to start Albuterol.  Sent inhaler to pharmacy.  Patient notified.  Nothing further needed.

## 2015-01-17 ENCOUNTER — Ambulatory Visit (HOSPITAL_COMMUNITY)
Admission: RE | Admit: 2015-01-17 | Discharge: 2015-01-17 | Disposition: A | Discharge status: Home / Self Care | Payer: Commercial Managed Care - HMO | Source: Ambulatory Visit | Attending: Cardiology | Admitting: Cardiology

## 2015-01-17 ENCOUNTER — Ambulatory Visit: Payer: Commercial Managed Care - HMO | Admitting: Internal Medicine

## 2015-01-17 VITALS — BP 140/68 | HR 93 | Wt 138.4 lb

## 2015-01-17 DIAGNOSIS — I509 Heart failure, unspecified: Secondary | ICD-10-CM | POA: Diagnosis not present

## 2015-01-17 DIAGNOSIS — J449 Chronic obstructive pulmonary disease, unspecified: Secondary | ICD-10-CM | POA: Diagnosis not present

## 2015-01-17 DIAGNOSIS — Z79899 Other long term (current) drug therapy: Secondary | ICD-10-CM | POA: Insufficient documentation

## 2015-01-17 DIAGNOSIS — J438 Other emphysema: Secondary | ICD-10-CM | POA: Diagnosis not present

## 2015-01-17 DIAGNOSIS — Z833 Family history of diabetes mellitus: Secondary | ICD-10-CM | POA: Diagnosis not present

## 2015-01-17 DIAGNOSIS — E785 Hyperlipidemia, unspecified: Secondary | ICD-10-CM | POA: Diagnosis not present

## 2015-01-17 DIAGNOSIS — Z825 Family history of asthma and other chronic lower respiratory diseases: Secondary | ICD-10-CM | POA: Diagnosis not present

## 2015-01-17 DIAGNOSIS — I2510 Atherosclerotic heart disease of native coronary artery without angina pectoris: Secondary | ICD-10-CM | POA: Diagnosis not present

## 2015-01-17 DIAGNOSIS — Z87891 Personal history of nicotine dependence: Secondary | ICD-10-CM | POA: Insufficient documentation

## 2015-01-17 DIAGNOSIS — E669 Obesity, unspecified: Secondary | ICD-10-CM | POA: Diagnosis not present

## 2015-01-17 DIAGNOSIS — Z801 Family history of malignant neoplasm of trachea, bronchus and lung: Secondary | ICD-10-CM | POA: Diagnosis not present

## 2015-01-17 DIAGNOSIS — E1140 Type 2 diabetes mellitus with diabetic neuropathy, unspecified: Secondary | ICD-10-CM | POA: Diagnosis not present

## 2015-01-17 DIAGNOSIS — I5032 Chronic diastolic (congestive) heart failure: Secondary | ICD-10-CM | POA: Diagnosis not present

## 2015-01-17 DIAGNOSIS — I739 Peripheral vascular disease, unspecified: Secondary | ICD-10-CM | POA: Insufficient documentation

## 2015-01-17 DIAGNOSIS — Z7982 Long term (current) use of aspirin: Secondary | ICD-10-CM | POA: Insufficient documentation

## 2015-01-17 DIAGNOSIS — I272 Other secondary pulmonary hypertension: Secondary | ICD-10-CM | POA: Insufficient documentation

## 2015-01-17 DIAGNOSIS — I10 Essential (primary) hypertension: Secondary | ICD-10-CM | POA: Insufficient documentation

## 2015-01-17 DIAGNOSIS — I270 Primary pulmonary hypertension: Secondary | ICD-10-CM | POA: Diagnosis not present

## 2015-01-17 DIAGNOSIS — Z9981 Dependence on supplemental oxygen: Secondary | ICD-10-CM | POA: Insufficient documentation

## 2015-01-17 DIAGNOSIS — K219 Gastro-esophageal reflux disease without esophagitis: Secondary | ICD-10-CM | POA: Insufficient documentation

## 2015-01-17 DIAGNOSIS — I50810 Right heart failure, unspecified: Secondary | ICD-10-CM | POA: Insufficient documentation

## 2015-01-17 LAB — BASIC METABOLIC PANEL
Anion gap: 9 (ref 5–15)
BUN: 13 mg/dL (ref 6–23)
CHLORIDE: 89 mmol/L — AB (ref 96–112)
CO2: 33 mmol/L — AB (ref 19–32)
Calcium: 9 mg/dL (ref 8.4–10.5)
Creatinine, Ser: 1.33 mg/dL — ABNORMAL HIGH (ref 0.50–1.10)
GFR calc Af Amer: 49 mL/min — ABNORMAL LOW (ref >90)
GFR calc non Af Amer: 42 mL/min — ABNORMAL LOW (ref >90)
GLUCOSE: 81 mg/dL (ref 70–99)
POTASSIUM: 4.2 mmol/L (ref 3.5–5.1)
Sodium: 131 mmol/L — ABNORMAL LOW (ref 135–145)

## 2015-01-17 LAB — BRAIN NATRIURETIC PEPTIDE: B Natriuretic Peptide: 179.4 pg/mL — ABNORMAL HIGH (ref 0.0–100.0)

## 2015-01-17 NOTE — Progress Notes (Signed)
Patient ID: Lindsay Harmon, female   DOB: October 26, 1956, 59 y.o.   MRN: [id number] PCP: Dr. Silvio Pate  HPI: Lindsay Harmon is a 59 year old with history of HTN, DM, IBS, depression, GERD, severe COPD and pulmonary hypertension with RV failure/cor pulmonale with admit to [facility] in February with volume overload and worsening renal function. She was diuresed with IV lasix and transitioned to torsemide. She also had RHC as noted below as well as PFTs.   She is stable.  She continues home oxygen for COPD, 2L rest/3L exertion.  She can walk around her house and short distances on flat ground without dyspnea.  No orthopnea/PND.  No palpitations, no chest pain.  No lightheadedness or syncope.    RHC 12/05/2014 RA 12 mmHg (mean) with O2 sat 72%; RV 97/16 mmHg with O2 sat 73%; PA 97/30 mmHg with O2 sat 67%; PCWP(mean) 14 mmHg (mean); Cardiac Output 5.78 L/min  PFTs 2/92016 with severe COPD  FEV1 0.87 (38%) FVC 1.41 (48%) FEF 25-75 0.41 (19%) Unable to do DLCO  Echo (2/16) EF 60-65%, aortic sclerosis, RV moderately dilated/moderately decreased systolic function, PASP 71 mmHg  Labs 12/13/2014: K 4.3 Creatinine 1.44, HCT 45.7  ROS: All systems negative except as listed in HPI, PMH and Problem List.  SH:  History   Social History  . Marital Status: Married    Spouse Name: N/A  . Number of Children: 1  . Years of Education: N/A   Occupational History  . 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 Smoking status: Former Smoker -- 1.50 packs/day for 20 years    Types: Cigarettes    Quit date: 01/04/2009  . Smokeless tobacco: Never Used  . Alcohol Use: No     Comment: heavy in the past  . Drug Use: No  . Sexual Activity: No   Other Topics Concern  . Not on file   Social History Narrative   No living will   Son Lindsay Harmon should make decisions for her if she is unable.    Would accept resuscitation attempts but no prolonged ventilation   Not sure about tube feeds but  probably wouldn't want them if cognitively unaware    FH:  Family History  Problem Relation Age of Onset  . Diabetes Mother   . Emphysema Paternal Grandfather   . Allergies Sister   . Allergies Father   . Cancer Mother     ovarian,melanoma  . Cancer Father     lung  . Cancer Maternal Grandmother     uterine    Past Medical History  Diagnosis Date  . Depression   . GERD (gastroesophageal reflux disease)   . Hyperlipidemia   . Hypertension   . PVD (peripheral vascular disease)   . CAD (coronary artery disease)   . Familial hematuria   . NIDDM (non-insulin dependent diabetes mellitus)     with neuropathy  . IBS (irritable bowel syndrome)   . Esophageal stricture   . Obesity, unspecified   . Nephrolithiasis   . Allergy   . Arthritis     Current Outpatient Prescriptions  Medication Sig Dispense Refill  . ACCU-CHEK FASTCLIX LANCETS MISC Use to check blood sugar once daily dx: E11.49 100 each 3  . albuterol (PROVENTIL HFA;VENTOLIN HFA) 108 (90 BASE) MCG/ACT inhaler Inhale 2 puffs into the lungs every 6 (six) hours as needed for wheezing or shortness of breath. 1 Inhaler 2  . ALPRAZolam (XANAX) 0.25 MG  tablet TAKE ONE (1) TABLET BY MOUTH EVERY DAY 90 tablet 0  . aspirin 325 MG tablet Take 325 mg by mouth daily.      . feeding supplement, ENSURE COMPLETE, (ENSURE COMPLETE) LIQD Take 237 mLs by mouth 2 (two) times daily between meals. 60 Bottle 0  . gabapentin (NEURONTIN) 300 MG capsule TAKE 1 CAPSULE FOUR TIMES DAILY 360 capsule 3  . glucose blood (ACCU-CHEK AVIVA PLUS) test strip Use as instructed to check blood sugar once daily dx: E11.49 100 each 3  . loratadine (CLARITIN) 10 MG tablet Take 10 mg by mouth 2 (two) times daily.     Marland Kitchen lovastatin (MEVACOR) 40 MG tablet TAKE 2 TABLETS EVERY DAY 180 tablet 3  . metFORMIN (GLUCOPHAGE) 1000 MG tablet Take 500 mg by mouth 2 (two) times daily with a meal.    . methadone (DOLOPHINE) 10 MG tablet Take 2 tablets four times a day. 240  tablet 0  . metoprolol tartrate (LOPRESSOR) 25 MG tablet Take 0.5 tablets (12.5 mg total) by mouth 2 (two) times daily. 60 tablet 0  . mirtazapine (REMERON) 30 MG tablet TAKE 1/2 TO 1 TABLET AT BEDTIME 90 tablet 3  . pantoprazole (PROTONIX) 40 MG tablet TAKE 1 TABLET TWICE DAILY 180 tablet 3  . torsemide (DEMADEX) 20 MG tablet TAKE 2 TABLETS TWICE DAILY 360 tablet 1  . nitroGLYCERIN (NITROSTAT) 0.4 MG SL tablet Place 1 tablet (0.4 mg total) under the tongue every 5 (five) minutes as needed. (Patient not taking: Reported on 01/17/2015) 60 tablet 1   No current facility-administered medications for this encounter.    Filed Vitals:   01/17/15 0948  BP: 140/68  Pulse: 93  Weight: 138 lb 6.4 oz (62.778 kg)  SpO2: 95%    PHYSICAL EXAM:  General: Chronically ill appearing. No resp difficulty. Mom present HEENT: normal Neck: supple. No JVD.  Carotids 2+ bilat; no bruits. No lymphadenopathy or thryomegaly appreciated. Cor: PMI nondisplaced. regular. No rubs, gallops. Prominent P2. 1/6 HSM LLSB.  Lungs: decreased throughout Abdomen: soft, nontender, nondistended. No hepatosplenomegaly. No bruits or masses. Good bowel sounds. Extremities: no cyanosis, + clubbing, 1+ edema to knees bilaterally Neuro: alert & oriented x 3, cranial nerves grossly intact. moves all 4 extremities w/o difficulty. Affect pleasant   ASSESSMENT & PLAN: 1. RV failure/cor pulmonale: NYHA III symptoms likely combination of COPD and CHF. Volume looks ok today, no JVD.  Still with peripheral edema.  - Continue torsemide 40 mg bid.  - Wear compression stockings.  - BMET/BNP today.  2. COPD:  PFTs FEV1 0.87 (38%), FVC 1.41 (48%), FEF 25-75 0.41 (19%), severe COPD on home oxygen.  This is a major contributor to her dyspnea.  - Continue home oxygen, follows with [name]. 3. Suspected OSA: has sleep study in April  4. PAH: Severe, WHO group III most likely from severe parenchymal lung disease (COPD). Not a good  candidate for selective pulmonary vasodilators. Treatment at this time will be oxygen and diuresis.   Lindsay Harmon 01/17/2015

## 2015-01-17 NOTE — Patient Instructions (Signed)
Labs today  Your physician recommends that you schedule a follow-up appointment in: 4 months  Do the following things EVERYDAY: 1) Weigh yourself in the morning before breakfast. Write it down and keep it in a log. 2) Take your medicines as prescribed 3) Eat low salt foods-Limit salt (sodium) to 2000 mg per day.  4) Stay as active as you can everyday 5) Limit all fluids for the day to less than 2 liters 6)

## 2015-01-23 ENCOUNTER — Encounter: Payer: Self-pay | Admitting: Internal Medicine

## 2015-01-23 ENCOUNTER — Ambulatory Visit (INDEPENDENT_AMBULATORY_CARE_PROVIDER_SITE_OTHER): Payer: Commercial Managed Care - HMO | Admitting: Internal Medicine

## 2015-01-23 VITALS — BP 130/70 | HR 88 | Temp 98.6°F | Wt 134.0 lb

## 2015-01-23 DIAGNOSIS — E1159 Type 2 diabetes mellitus with other circulatory complications: Secondary | ICD-10-CM | POA: Diagnosis not present

## 2015-01-23 DIAGNOSIS — J9611 Chronic respiratory failure with hypoxia: Secondary | ICD-10-CM | POA: Diagnosis not present

## 2015-01-23 DIAGNOSIS — I50810 Right heart failure, unspecified: Secondary | ICD-10-CM

## 2015-01-23 DIAGNOSIS — N183 Chronic kidney disease, stage 3 unspecified: Secondary | ICD-10-CM

## 2015-01-23 DIAGNOSIS — I270 Primary pulmonary hypertension: Secondary | ICD-10-CM

## 2015-01-23 DIAGNOSIS — E1151 Type 2 diabetes mellitus with diabetic peripheral angiopathy without gangrene: Secondary | ICD-10-CM

## 2015-01-23 DIAGNOSIS — I509 Heart failure, unspecified: Secondary | ICD-10-CM

## 2015-01-23 DIAGNOSIS — I272 Pulmonary hypertension, unspecified: Secondary | ICD-10-CM

## 2015-01-23 NOTE — Progress Notes (Signed)
Pre visit review using our clinic review tool, if applicable. No additional management support is needed unless otherwise documented below in the visit note.

## 2015-01-23 NOTE — Assessment & Plan Note (Signed)
Renal function just checked and is stable

## 2015-01-23 NOTE — Assessment & Plan Note (Signed)
Will be following at CHF clinic now

## 2015-01-23 NOTE — Assessment & Plan Note (Signed)
Doing okay on the oxygen Needs portable concentrator

## 2015-01-23 NOTE — Assessment & Plan Note (Signed)
Breathing is better on the oxygen and with the diuretics

## 2015-01-23 NOTE — Progress Notes (Signed)
Subjective:    Patient ID: Lindsay Harmon, female    DOB: 24-Mar-1956, 59 y.o.   MRN: [id number]  HPI Here with mom Reviewed her notes from pulmonary and CHF clinic Now on stiolto--- no problems starting this  On oxygen all the time-- 2l/min at rest and 3l/min with exertion Has been doing all instrumental ADLs--- mom goes shopping with her No DOE doing normal household activities No other exercise --discussed starting to walk some  No chest pain No palpitations No dizziness or syncope Mild edema in lower legs---plans to get compression stockings Doesn't feel fluid in her abdomen  Current Outpatient Prescriptions on File Prior to Visit  Medication Sig Dispense Refill  . ACCU-CHEK FASTCLIX LANCETS MISC Use to check blood sugar once daily dx: E11.49 100 each 3  . albuterol (PROVENTIL HFA;VENTOLIN HFA) 108 (90 BASE) MCG/ACT inhaler Inhale 2 puffs into the lungs every 6 (six) hours as needed for wheezing or shortness of breath. 1 Inhaler 2  . ALPRAZolam (XANAX) 0.25 MG tablet TAKE ONE (1) TABLET BY MOUTH EVERY DAY 90 tablet 0  . aspirin 325 MG tablet Take 325 mg by mouth daily.      . feeding supplement, ENSURE COMPLETE, (ENSURE COMPLETE) LIQD Take 237 mLs by mouth 2 (two) times daily between meals. 60 Bottle 0  . gabapentin (NEURONTIN) 300 MG capsule TAKE 1 CAPSULE FOUR TIMES DAILY 360 capsule 3  . glucose blood (ACCU-CHEK AVIVA PLUS) test strip Use as instructed to check blood sugar once daily dx: E11.49 100 each 3  . loratadine (CLARITIN) 10 MG tablet Take 10 mg by mouth 2 (two) times daily.     Marland Kitchen lovastatin (MEVACOR) 40 MG tablet TAKE 2 TABLETS EVERY DAY 180 tablet 3  . metFORMIN (GLUCOPHAGE) 1000 MG tablet Take 500 mg by mouth 2 (two) times daily with a meal.    . methadone (DOLOPHINE) 10 MG tablet Take 2 tablets four times a day. 240 tablet 0  . metoprolol tartrate (LOPRESSOR) 25 MG tablet Take 0.5 tablets (12.5 mg total) by mouth 2 (two) times daily. 60 tablet 0  . mirtazapine  (REMERON) 30 MG tablet TAKE 1/2 TO 1 TABLET AT BEDTIME 90 tablet 3  . nitroGLYCERIN (NITROSTAT) 0.4 MG SL tablet Place 1 tablet (0.4 mg total) under the tongue every 5 (five) minutes as needed. 60 tablet 1  . pantoprazole (PROTONIX) 40 MG tablet TAKE 1 TABLET TWICE DAILY 180 tablet 3  . torsemide (DEMADEX) 20 MG tablet TAKE 2 TABLETS TWICE DAILY 360 tablet 1   No current facility-administered medications on file prior to visit.    No Known Allergies  Past Medical History  Diagnosis Date  . Depression   . GERD (gastroesophageal reflux disease)   . Hyperlipidemia   . Hypertension   . PVD (peripheral vascular disease)   . CAD (coronary artery disease)   . Familial hematuria   . NIDDM (non-insulin dependent diabetes mellitus)     with neuropathy  . IBS (irritable bowel syndrome)   . Esophageal stricture   . Obesity, unspecified   . Nephrolithiasis   . Allergy   . Arthritis     Past Surgical History  Procedure Laterality Date  . Abdominal hysterectomy    . Cesarean section    . Carpal tunnel release    . Right heart catheterization N/A 12/05/2014    Procedure: RIGHT HEART CATH;  Surgeon: Sinclair Grooms, MD;  Location[facility] C[facility] LAB;  Service: Cardiovascular;  Laterality: N/A;  Family History  Problem Relation Age of Onset  . Diabetes Mother   . Emphysema Paternal Grandfather   . Allergies Sister   . Allergies Father   . Cancer Mother     ovarian,melanoma  . Cancer Father     lung  . Cancer Maternal Grandmother     uterine    History   Social History  . Marital Status: Married    Spouse Name: N/A  . Number of Children: 1  . Years of Education: N/A   Occupational History  . 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 Smoking status: Former Smoker -- 1.50 packs/day for 20 years    Types: Cigarettes    Quit date: 01/04/2009  . Smokeless tobacco: Never Used  . Alcohol Use: No     Comment: heavy in the past  . Drug Use: No  .  Sexual Activity: No   Other Topics Concern  . Not on file   Social History Narrative   No living will   Son [name] should make decisions for her if she is unable.    Would accept resuscitation attempts but no prolonged ventilation   Not sure about tube feeds but probably wouldn't want them if cognitively unaware   Review of Systems  Appetite is okay Sugars are fine--65-126. No hypoglycemic reactions Weight fairly stable---checking daily No nausea but vomited several times last night     Objective:   Physical Exam  Constitutional: She appears well-developed. No distress.  Neck: Normal range of motion. Neck supple. No thyromegaly present.  Cardiovascular: Normal rate, regular rhythm and normal heart sounds.  Exam reveals no gallop.   No murmur heard. Pulmonary/Chest: Effort normal and breath sounds normal. No respiratory distress. She has no wheezes. She has no rales.  Abdominal: Soft. There is no tenderness.  Musculoskeletal: She exhibits no edema.  Calves tight but no pitting edema  Lymphadenopathy:    She has no cervical adenopathy.  Psychiatric: She has a normal mood and affect. Her behavior is normal.          Assessment & Plan:

## 2015-01-23 NOTE — Assessment & Plan Note (Signed)
Sugars have been fine Lab Results  Component Value Date   HGBA1C 7.6* 12/03/2014

## 2015-01-30 ENCOUNTER — Other Ambulatory Visit: Payer: Self-pay | Admitting: *Deleted

## 2015-01-30 MED ORDER — METHADONE HCL 10 MG PO TABS: ORAL_TABLET | ORAL | Status: DC

## 2015-01-30 NOTE — Telephone Encounter (Signed)
done

## 2015-01-30 NOTE — Telephone Encounter (Signed)
Spoke with patient and advised rx ready for pick-up and it will be at the front desk.

## 2015-01-30 NOTE — Telephone Encounter (Signed)
Patient left a voicemail requesting a refill on Methadone. Last refill 12/27/14 #240, last office visit 01/23/15.  Call when ready for pickup

## 2015-01-30 NOTE — Telephone Encounter (Signed)
Patient asking if a handicapped placard can be filled out? Form on your desk

## 2015-02-01 NOTE — Progress Notes (Signed)
Order placed for poc through [facility].  Nothing further needed.

## 2015-02-01 NOTE — Addendum Note (Signed)
Addended by: Len Blalock on: 02/01/2015 11:16 AM   Modules accepted: Orders

## 2015-02-08 ENCOUNTER — Ambulatory Visit (HOSPITAL_BASED_OUTPATIENT_CLINIC_OR_DEPARTMENT_OTHER): Payer: Commercial Managed Care - HMO | Attending: Pulmonary Disease | Admitting: Radiology

## 2015-02-08 VITALS — Ht 62.0 in | Wt 139.0 lb

## 2015-02-08 DIAGNOSIS — G4719 Other hypersomnia: Secondary | ICD-10-CM | POA: Diagnosis not present

## 2015-02-08 DIAGNOSIS — G4730 Sleep apnea, unspecified: Secondary | ICD-10-CM | POA: Insufficient documentation

## 2015-02-08 DIAGNOSIS — G4733 Obstructive sleep apnea (adult) (pediatric): Secondary | ICD-10-CM

## 2015-02-08 DIAGNOSIS — G4710 Hypersomnia, unspecified: Secondary | ICD-10-CM | POA: Diagnosis present

## 2015-02-10 ENCOUNTER — Other Ambulatory Visit: Payer: Self-pay

## 2015-02-10 NOTE — Patient Outreach (Signed)
[place] [facility]) Care Management  02/10/2015  Lindsay Harmon February 19, 1956 [id number]  RN CM attempted to reach patient to discuss the services of [facility].  Patient was unavailable.  RN CM will try to reach patient again at a later date.  Maury Dus, RN, Ishmael Holter, [place] Telephonic Care Coordinator [phone]

## 2015-02-13 ENCOUNTER — Other Ambulatory Visit: Payer: Self-pay

## 2015-02-13 DIAGNOSIS — I5033 Acute on chronic diastolic (congestive) heart failure: Secondary | ICD-10-CM

## 2015-02-13 NOTE — Patient Outreach (Signed)
[place]) Care Management  02/13/2015  Lindsay Harmon 06/18/1956 [id number]   RN CM spoke with patient about the services of [facility].  Patient agrees to the services and is agreeable to schedule next appointment with [place].  Patient states her main health care concern is her new diagnosis of congestive heart failure.  Patient states she was in the [facility] in February of this year for fluid overload.  Patient states she has been seen by doctors at the heart failure clinic at [facility].  Patient states she would like to know how to self-manage her congestive health failure through more education about heart failure, nutrition related to heart failure, what are the signs and symptom of heart failure.  Patient would like having an action plan in place of what to do when her heart failure gets worse.  Patient states she takes her medications but is not sure what each one is for.  Patient has other chronic diseases that make her failure heart complex.  She has type II diabetes, hypertension, COPD and chronic kidney disease stage III. RN CM will make a referral to community health coach for Heart failure education to help patient self-manage her chronic diseases.  RN will send a welcome letter to patient.  RN CM will have a welcome packet sent to patient including a consent form to sign and return.  Maury Dus, RN, Ishmael Holter, [place] Telephonic Care Coordinator [phone]

## 2015-02-15 ENCOUNTER — Other Ambulatory Visit: Payer: Self-pay

## 2015-02-15 NOTE — Patient Outreach (Signed)
[place] [facility]) Care Management  02/15/2015  Lindsay Harmon [birth year] [id number]   Telephonic outreach call attempted.  No answer.  Will attempt second call within 4 business days.  Candie Mile, RN, MSN [place] [phone] Fax [phone]

## 2015-02-16 DIAGNOSIS — G4733 Obstructive sleep apnea (adult) (pediatric): Secondary | ICD-10-CM | POA: Diagnosis not present

## 2015-02-16 NOTE — Sleep Study (Signed)
   NAME: Lindsay Harmon DATE OF BIRTH:  1956-03-18 MEDICAL RECORD NUMBER [id number]  LOCATION: [facility]  PHYSICIAN: Kathee Delton  DATE OF STUDY: 02/08/2015  SLEEP STUDY TYPE: Nocturnal Polysomnogram               REFERRING PHYSICIAN: Juanito Doom, MD  INDICATION FOR STUDY: Hypersomnia with sleep apnea  EPWORTH SLEEPINESS SCORE:  6 HEIGHT: _0  (157.5 cm)  WEIGHT: 139 lb (63.05 kg)    Body mass index is 25.42 kg/(m^2).  NECK SIZE: 13.5 in.  MEDICATIONS: Reviewed in sleep record  SLEEP ARCHITECTURE: The patient had a total sleep time of 378 minutes with significant slow-wave sleep for her age, and adequate quantity of REM. Sleep onset latency was normal at 18 minutes, and REM onset was normal at 71 minutes. Sleep efficiency was normal as well as 92%.  RESPIRATORY DATA: The patient was found to have 7 apneas and no obstructive hypopneas, giving her an AHI of only 1 events per hour. It should be noted the patient slept in a chair upper right for the entire study, and had no supine sleep. She did not meet split-night criteria secondary to her small numbers of events.  OXYGEN DATA: The patient was started on 2 L of oxygen as she wears at home, but was increased to 3 L at 2200 hrs. secondary to desaturations into the 80s.  Low saturation for the night was 85%.  CARDIAC DATA: No clinically significant arrhythmias were noted  MOVEMENT/PARASOMNIA: The patient had no periodic limb movements or other abnormal behaviors seen.  IMPRESSION/ RECOMMENDATION:    1) small numbers of obstructive events which do not meet the AHI criteria for the obstructive sleep apnea syndrome. However, it should be noted that she slept in a chair the entire night and had no supine sleep. This may underestimate her degree of sleep disordered breathing, although she may sleep in the same fashion at home.  2) oxygen desaturation as low as 85% during the night. The patient was started on  oxygen at 2 L/m as she wears at home, and this was increased to 3 L at 2200 hrs. secondary to persistent desaturations.    Kathee Delton Diplomate, American Board of Sleep Medicine  ELECTRONICALLY SIGNED ON:  02/16/2015, 1:49 PM [place] PH: [phone]   FX: [phone] [place]

## 2015-02-17 ENCOUNTER — Ambulatory Visit (INDEPENDENT_AMBULATORY_CARE_PROVIDER_SITE_OTHER): Payer: Commercial Managed Care - HMO | Admitting: Pulmonary Disease

## 2015-02-17 ENCOUNTER — Encounter: Payer: Self-pay | Admitting: Pulmonary Disease

## 2015-02-17 ENCOUNTER — Other Ambulatory Visit: Payer: Commercial Managed Care - HMO

## 2015-02-17 VITALS — BP 132/66 | HR 80 | Ht 61.0 in | Wt 143.0 lb

## 2015-02-17 DIAGNOSIS — J9611 Chronic respiratory failure with hypoxia: Secondary | ICD-10-CM | POA: Diagnosis not present

## 2015-02-17 DIAGNOSIS — J432 Centrilobular emphysema: Secondary | ICD-10-CM | POA: Diagnosis not present

## 2015-02-17 MED ORDER — TIOTROPIUM BROMIDE-OLODATEROL 2.5-2.5 MCG/ACT IN AERS: 2.0000 | INHALATION_SPRAY | Freq: Every day | RESPIRATORY_TRACT | Status: DC

## 2015-02-17 NOTE — Progress Notes (Signed)
Subjective:    Patient ID: Lindsay Harmon, female    DOB: [birth year], 59 y.o.   MRN: [id number]   Synopsis: Referred by cardiology in 2016 for evaluation of severe COPD and possible obstructive sleep apnea in the setting of pulmonary hypertension. She also has congestive heart failure. A sleep study in 2016 showed no evidence of obstructive sleep apnea but she does have significant nocturnal hypoxemia. Spirometry testing in 2016 confirmed a diagnosis of COPD with severe airflow obstruction FEV1 41% pred.  HPI Chief Complaint  Patient presents with  . Follow-up    pt states that she has run out of stiolto, didn't notice much difference on or off medication.     Lindsay Harmon says that she took the [employer] but she doesn't think that it made a big difference.  However after she ran out of the [place] she thinks that her breathing may have been a little worse.  She did not have problems with the medication. She has not had a cough in the last several weeks. She says her breathing is at baseline.  Past Medical History  Diagnosis Date  . Depression   . GERD (gastroesophageal reflux disease)   . Hyperlipidemia   . Hypertension   . PVD (peripheral vascular disease)   . CAD (coronary artery disease)   . Familial hematuria   . NIDDM (non-insulin dependent diabetes mellitus)     with neuropathy  . IBS (irritable bowel syndrome)   . Esophageal stricture   . Obesity, unspecified   . Nephrolithiasis   . Allergy   . Arthritis       Review of Systems  Constitutional: Positive for fatigue. Negative for fever and diaphoresis.  HENT: Negative for postnasal drip, rhinorrhea and sinus pressure.   Respiratory: Positive for shortness of breath. Negative for cough and wheezing.   Cardiovascular: Negative for chest pain, palpitations and leg swelling.       Objective:   Physical Exam  Filed Vitals:   02/17/15 0958  BP: 132/66  Pulse: 80  Height: _0  (1.549 m)  Weight: 143 lb (64.864 kg)    SpO2: 92%  2L [place]  Gen: chronically ill appearing HENT: OP clear, TM's clear, neck supple PULM: few crackles bases, normal effort CV: RRR, no mgr, trace edema GI: BS+, soft, nontender Derm: no cyanosis or rash Psyche: normal mood and affect  Sleep study documents reviewed which showed no evidence of OSA but nocturnal desaturaiton     Assessment & Plan:   COPD (chronic obstructive pulmonary disease) Lindsay Harmon has gold grade D COPD which implies very severe disease.  Though she did not experience a dramatic change in symptoms with the inhaled therapy, I told her today that it is important to take on a daily basis to improve airflow and to reduce the likelihood of severe exacerbation.  Plan: -Daily Stiolto  -Albuterol as needed -Get a flu shot in the fall   Chronic respiratory failure with hypoxia Based on the results from her sleep study which did not show obstructive sleep apnea, we did CD saturation on 2 L nasal cannula. She was advised to increase her nighttime dose of oxygen to 3 L.     Updated Medication List Outpatient Encounter Prescriptions as of 02/17/2015  Medication Sig  . ACCU-CHEK FASTCLIX LANCETS MISC Use to check blood sugar once daily dx: E11.49  . albuterol (PROVENTIL HFA;VENTOLIN HFA) 108 (90 BASE) MCG/ACT inhaler Inhale 2 puffs into the lungs every 6 (six) hours as needed for  wheezing or shortness of breath.  . ALPRAZolam (XANAX) 0.25 MG tablet TAKE ONE (1) TABLET BY MOUTH EVERY DAY  . aspirin 325 MG tablet Take 325 mg by mouth daily.    . feeding supplement, ENSURE COMPLETE, (ENSURE COMPLETE) LIQD Take 237 mLs by mouth 2 (two) times daily between meals.  . gabapentin (NEURONTIN) 300 MG capsule TAKE 1 CAPSULE FOUR TIMES DAILY  . glucose blood (ACCU-CHEK AVIVA PLUS) test strip Use as instructed to check blood sugar once daily dx: E11.49  . loratadine (CLARITIN) 10 MG tablet Take 10 mg by mouth 2 (two) times daily.   Marland Kitchen lovastatin (MEVACOR) 40 MG tablet TAKE 2  TABLETS EVERY DAY  . metFORMIN (GLUCOPHAGE) 1000 MG tablet Take 500 mg by mouth 2 (two) times daily with a meal.  . methadone (DOLOPHINE) 10 MG tablet Take 2 tablets four times a day.  . metoprolol tartrate (LOPRESSOR) 25 MG tablet Take 0.5 tablets (12.5 mg total) by mouth 2 (two) times daily.  . mirtazapine (REMERON) 30 MG tablet TAKE 1/2 TO 1 TABLET AT BEDTIME  . nitroGLYCERIN (NITROSTAT) 0.4 MG SL tablet Place 1 tablet (0.4 mg total) under the tongue every 5 (five) minutes as needed.  . pantoprazole (PROTONIX) 40 MG tablet TAKE 1 TABLET TWICE DAILY  . torsemide (DEMADEX) 20 MG tablet TAKE 2 TABLETS TWICE DAILY  . Tiotropium Bromide-Olodaterol (STIOLTO RESPIMAT) 2.5-2.5 MCG/ACT AERS Inhale 2 puffs into the lungs daily.  . [DISCONTINUED] Tiotropium Bromide-Olodaterol 2.5-2.5 MCG/ACT AERS Inhale 2 puffs into the lungs at bedtime.

## 2015-02-17 NOTE — Patient Instructions (Signed)
Take the Stiolto 2 puffs daily pneumonia or he feel Use albuterol as needed for shortness of breath Increase the oxygen to 3 L at night Use 2 L of oxygen during the day no matter how you feel We will see you back in 6 months

## 2015-02-17 NOTE — Assessment & Plan Note (Signed)
Based on the results from her sleep study which did not show obstructive sleep apnea, we did CD saturation on 2 L nasal cannula. She was advised to increase her nighttime dose of oxygen to 3 L.

## 2015-02-17 NOTE — Assessment & Plan Note (Signed)
Lindsay Harmon has gold grade D COPD which implies very severe disease.  Though she did not experience a dramatic change in symptoms with the inhaled therapy, I told her today that it is important to take on a daily basis to improve airflow and to reduce the likelihood of severe exacerbation.  Plan: -Daily Stiolto  -Albuterol as needed -Get a flu shot in the fall

## 2015-02-17 NOTE — Patient Outreach (Signed)
[place] [facility]) Care Management  02/17/2015  Lindsay Harmon 06/14/1956 [id number]   Initial Health Coach telephonic contact today.  Patient has new diagnosis of Congestive Heart Failure, and has lack of knowledge about self-management of CHF.  Her stated goal is to avoid readmission to the hospital. Education began today re CHF Action Plan.  Will mail educational materials and contact again within a month for follow-up.  Candie Mile, RN, MSN [place] [phone] Fax [phone]

## 2015-02-24 ENCOUNTER — Telehealth: Payer: Self-pay | Admitting: Pulmonary Disease

## 2015-02-24 NOTE — Telephone Encounter (Signed)
Patient has not heard anything from [place] regarding her oxygen order.  Called [employer], it takes up to 8 weeks to get POC.  Arbie Cookey at [facility] said that she is watching for the order, but it has not come in yet. Patient notified. Nothing further needed.

## 2015-02-28 ENCOUNTER — Other Ambulatory Visit: Payer: Self-pay | Admitting: *Deleted

## 2015-02-28 NOTE — Telephone Encounter (Signed)
01/30/15

## 2015-03-01 MED ORDER — METHADONE HCL 10 MG PO TABS: ORAL_TABLET | ORAL | Status: DC

## 2015-03-01 NOTE — Telephone Encounter (Signed)
Spoke with patient and advised rx ready for pick-up and it will be at the front desk.  

## 2015-03-09 ENCOUNTER — Telehealth: Payer: Self-pay | Admitting: Internal Medicine

## 2015-03-09 NOTE — Telephone Encounter (Signed)
Pt dropped off forms requested by [employer]. Placing on Dee's desk.

## 2015-03-09 NOTE — Telephone Encounter (Signed)
Form on your desk

## 2015-03-09 NOTE — Telephone Encounter (Signed)
Form done No charge

## 2015-03-09 NOTE — Telephone Encounter (Signed)
I spoke to patient and let her know form is ready to be picked up.

## 2015-03-15 ENCOUNTER — Ambulatory Visit: Payer: Commercial Managed Care - HMO

## 2015-03-15 ENCOUNTER — Other Ambulatory Visit: Payer: Self-pay

## 2015-03-15 NOTE — Patient Outreach (Addendum)
[place] [facility]) Care Management  03/15/2015  Lindsay Harmon 09-12-1956 [id number]   RN Health Coach outreach phone call completed.  Patient being followed for knowledge deficit regarding CHF, which was diagnosed in February 2016.  Education and support provided regarding adherence to CHF Action Plan.  Patient has begun weighing daily and was able to report 3 symptoms from the "yellow zone" of the action plan which would indicate a need to contact her doctor.  Education also provided re low sodium diet, and availability of 24 hour nurse line.  Will contact patient again within one month.  Candie Mile, RN, MSN [place] [phone] Fax [phone]

## 2015-03-29 ENCOUNTER — Other Ambulatory Visit: Payer: Self-pay

## 2015-03-29 NOTE — Telephone Encounter (Signed)
Pt left v/m requesting rx methadone. Call when ready for pick up. Pt seen 01/23/15 and # 240 printed 03/01/15.

## 2015-03-30 MED ORDER — METHADONE HCL 10 MG PO TABS: ORAL_TABLET | ORAL | Status: DC

## 2015-03-30 NOTE — Telephone Encounter (Signed)
Spoke with patient and advised rx ready for pick-up and it will be at the front desk.

## 2015-03-31 ENCOUNTER — Other Ambulatory Visit: Payer: Self-pay | Admitting: Internal Medicine

## 2015-03-31 NOTE — Telephone Encounter (Signed)
rx called into pharmacy

## 2015-03-31 NOTE — Telephone Encounter (Signed)
12/22/2014

## 2015-03-31 NOTE — Telephone Encounter (Signed)
Approved: #90 x 0 

## 2015-04-10 ENCOUNTER — Telehealth: Payer: Self-pay

## 2015-04-10 NOTE — Telephone Encounter (Signed)
Pt returning cb to [facility]; pt not sure nature of call and request cb.

## 2015-04-11 ENCOUNTER — Other Ambulatory Visit: Payer: Self-pay

## 2015-04-11 NOTE — Patient Outreach (Signed)
[facility]) Care Management  [place]  04/11/2015   TROI BECHTOLD 10-Mar-1956 [id number]  Subjective: Patient reports she is using her [facility] Calendar to record daily weights and blood sugars.  Her weight has gradually increased from 144 pounds in May to 151.2 pounds today.  She states she has had no swelling or edema, and no signs of worsening heart failure.  Reports she is adhering to a low salt diet most of the time.  Patient also reports that she keeps a record of daily fasting blood sugars, and that they have ranged from 90 to 133.  Patient reports drinking Ensure supplement twice a day, even though her appetite is good.   Concerns voiced by patient today include foot care.  She fears cutting herself when trimming her nails.  .  Teaching provided about daily foot exams and prevention of cuts or other injuries to her feet.  She also reported feeling 'down' for the past several days.    Objective:    Current Medications:  Current Outpatient Prescriptions  Medication Sig Dispense Refill  . ACCU-CHEK FASTCLIX LANCETS MISC Use to check blood sugar once daily dx: E11.49 100 each 3  . albuterol (PROVENTIL HFA;VENTOLIN HFA) 108 (90 BASE) MCG/ACT inhaler Inhale 2 puffs into the lungs every 6 (six) hours as needed for wheezing or shortness of breath. 1 Inhaler 2  . ALPRAZolam (XANAX) 0.25 MG tablet TAKE ONE (1) TABLET EACH DAY 90 tablet 0  . aspirin 325 MG tablet Take 325 mg by mouth daily.      . feeding supplement, ENSURE COMPLETE, (ENSURE COMPLETE) LIQD Take 237 mLs by mouth 2 (two) times daily between meals. 60 Bottle 0  . gabapentin (NEURONTIN) 300 MG capsule TAKE 1 CAPSULE FOUR TIMES DAILY 360 capsule 3  . glucose blood (ACCU-CHEK AVIVA PLUS) test strip Use as instructed to check blood sugar once daily dx: E11.49 100 each 3  . loratadine (CLARITIN) 10 MG tablet Take 10 mg by mouth 2 (two) times daily.     Marland Kitchen lovastatin (MEVACOR) 40 MG tablet TAKE 2 TABLETS EVERY DAY  180 tablet 3  . metFORMIN (GLUCOPHAGE) 1000 MG tablet Take 500 mg by mouth 2 (two) times daily with a meal.    . methadone (DOLOPHINE) 10 MG tablet Take 2 tablets four times a day. 240 tablet 0  . metoprolol tartrate (LOPRESSOR) 25 MG tablet Take 0.5 tablets (12.5 mg total) by mouth 2 (two) times daily. 60 tablet 0  . mirtazapine (REMERON) 30 MG tablet TAKE 1/2 TO 1 TABLET AT BEDTIME 90 tablet 3  . nitroGLYCERIN (NITROSTAT) 0.4 MG SL tablet Place 1 tablet (0.4 mg total) under the tongue every 5 (five) minutes as needed. 60 tablet 1  . pantoprazole (PROTONIX) 40 MG tablet TAKE 1 TABLET TWICE DAILY 180 tablet 3  . Tiotropium Bromide-Olodaterol (STIOLTO RESPIMAT) 2.5-2.5 MCG/ACT AERS Inhale 2 puffs into the lungs daily. 4 g 11  . torsemide (DEMADEX) 20 MG tablet TAKE 2 TABLETS TWICE DAILY 360 tablet 1   No current facility-administered medications for this visit.    Functional Status:  In your present state of health, do you have any difficulty performing the following activities: 02/17/2015 12/03/2014  Hearing? N -  Vision? N -  Difficulty concentrating or making decisions? N -  Walking or climbing stairs? N -  Dressing or bathing? N -  Doing errands, shopping? N N  Preparing Food and eating ? N -  Using the Toilet? N -  In the past six months, have you accidently leaked urine? Y -  Do you have problems with loss of bowel control? N -  Managing your Medications? N -  Managing your Finances? N -  Housekeeping or managing your Housekeeping? N -    Fall/Depression Screening: PHQ 2/9 Scores 04/11/2015 02/17/2015 09/08/2012  PHQ - 2 Score 4 0 0  PHQ- 9 Score 4 - -   Assessment:  Patient will benefit from ongoing education through monthly calls for self management of heart failure. Patient was unable to discuss the heart failure action plan using the zone tool with detail.  Depression screening completed due to self-reported loss of interest in usual activities.  Plan:  Instructed patient to  report symptoms of depression to her PCP if they do not resolve this week. Recommended she call her physician for a podiatrist referral, and also to ask PCP if she is to continue with supplements in light of weight gain.  Health Coach will schedule for follow-up call in approximately one month.    Note: Patient reports she has signed and mailed consents as requested.  Candie Mile, RN, MSN [place] [phone] Fax [phone]

## 2015-04-11 NOTE — Telephone Encounter (Signed)
Spoke with patient about her diabetes supplies, we received a form from a company in [place] and pt does not want to use this company.

## 2015-04-18 NOTE — Patient Outreach (Signed)
[place] [facility]) Care Management  04/18/2015  LITZY DICKER 1955-10-31 [id number]   [facility] Care Management consent received in the mail from patient and indexed to chart.  Ronnell Freshwater. [place], [place] Management [place] Assistant Phone: [phone] Fax: [phone]

## 2015-05-02 ENCOUNTER — Other Ambulatory Visit: Payer: Self-pay

## 2015-05-02 MED ORDER — METHADONE HCL 10 MG PO TABS: ORAL_TABLET | ORAL | Status: DC

## 2015-05-02 NOTE — Telephone Encounter (Signed)
Pt left v/m requesting rx methadone. Call when ready for pick up. Pt last seen 01/23/15 and rx last printed # 240 on 03/30/15.

## 2015-05-02 NOTE — Telephone Encounter (Signed)
Spoke with patient and advised rx ready for pick-up and it will be at the front desk.

## 2015-05-10 ENCOUNTER — Other Ambulatory Visit: Payer: Self-pay

## 2015-05-10 ENCOUNTER — Ambulatory Visit: Payer: Commercial Managed Care - HMO | Admitting: Internal Medicine

## 2015-05-10 ENCOUNTER — Ambulatory Visit: Payer: Self-pay

## 2015-05-10 NOTE — Patient Outreach (Signed)
[facility] [facility]) Care Management  Lindsay [facility]  05/10/2015   Lindsay Harmon 1956/08/18 [id number]  Subjective:  Patient reports she is not feeling well today.  She is having increased pain in her lower back, not controlled with current regimen of Methodone 10 mg 2 tablets four times a day.  She also reports an increase in weight.  Weight today is 156.4 pounds, up 2 pounds in 2 days.  Weight on June 14th was 151.2 pounds.  She reports no swelling or edema, but she does have increased fatigue and weakness.  Objective:    Current Medications:  Current Outpatient Prescriptions  Medication Sig Dispense Refill  . ACCU-CHEK FASTCLIX LANCETS MISC Use to check blood sugar once daily dx: E11.49 100 each 3  . albuterol (PROVENTIL HFA;VENTOLIN HFA) 108 (90 BASE) MCG/ACT inhaler Inhale 2 puffs into the lungs every 6 (six) hours as needed for wheezing or shortness of breath. 1 Inhaler 2  . ALPRAZolam (XANAX) 0.25 MG tablet TAKE ONE (1) TABLET EACH DAY 90 tablet 0  . aspirin 325 MG tablet Take 325 mg by mouth daily.      . feeding supplement, ENSURE COMPLETE, (ENSURE COMPLETE) LIQD Take 237 mLs by mouth 2 (two) times daily between meals. 60 Bottle 0  . gabapentin (NEURONTIN) 300 MG capsule TAKE 1 CAPSULE FOUR TIMES DAILY 360 capsule 3  . glucose blood (ACCU-CHEK AVIVA PLUS) test strip Use as instructed to check blood sugar once daily dx: E11.49 100 each 3  . loratadine (CLARITIN) 10 MG tablet Take 10 mg by mouth 2 (two) times daily.     Marland Kitchen lovastatin (MEVACOR) 40 MG tablet TAKE 2 TABLETS EVERY DAY 180 tablet 3  . metFORMIN (GLUCOPHAGE) 1000 MG tablet Take 500 mg by mouth 2 (two) times daily with a meal.    . methadone (DOLOPHINE) 10 MG tablet Take 2 tablets four times a day. 240 tablet 0  . metoprolol tartrate (LOPRESSOR) 25 MG tablet Take 0.5 tablets (12.5 mg total) by mouth 2 (two) times daily. 60 tablet 0  . mirtazapine (REMERON) 30 MG tablet TAKE 1/2 TO 1 TABLET AT BEDTIME 90  tablet 3  . nitroGLYCERIN (NITROSTAT) 0.4 MG SL tablet Place 1 tablet (0.4 mg total) under the tongue every 5 (five) minutes as needed. 60 tablet 1  . pantoprazole (PROTONIX) 40 MG tablet TAKE 1 TABLET TWICE DAILY 180 tablet 3  . Tiotropium Bromide-Olodaterol (STIOLTO RESPIMAT) 2.5-2.5 MCG/ACT AERS Inhale 2 puffs into the lungs daily. 4 g 11  . torsemide (DEMADEX) 20 MG tablet TAKE 2 TABLETS TWICE DAILY 360 tablet 1   No current facility-administered medications for this visit.    Functional Status:  In your present state of health, do you have any difficulty performing the following activities: 05/10/2015 02/17/2015  Hearing? N N  Vision? N N  Difficulty concentrating or making decisions? N N  Walking or climbing stairs? Y N  Dressing or bathing? N N  Doing errands, shopping? N N  Preparing Food and eating ? N N  Using the Toilet? N N  In the past six months, have you accidently leaked urine? - Y  Do you have problems with loss of bowel control? N N  Managing your Medications? N N  Managing your Finances? N N  Housekeeping or managing your Housekeeping? N N    Fall/Depression Screening: PHQ 2/9 Scores 05/10/2015 05/10/2015 04/11/2015 02/17/2015 09/08/2012  PHQ - 2 Score _0 0 0  PHQ- 9 Score -  3 4 - -   THN CM Care Plan Problem One        Patient Outreach Telephone from 05/10/2015 in [facility]   Care Plan Problem One  Lack of knowledge about congestive heart failure   Care Plan for Problem One  Active   Interventions for Problem One Long Term Goal  Using teach back method, pt education re Action Plan reinforced.  Instructed to review EMMI materials to discuss next contact.   THN CM Short Term Goal #1 (0-30 days)  Patient will be able to describe 4 signs/symptoms of Congestive Heart Failure within 30 days.   THN CM Short Term Goal #1 Start Date  02/17/15   THN CM Short Term Goal #1 Met Date  05/10/15   Interventions for Short Term Goal #1  Reinforced education re  Action Plan.   THN CM Short Term Goal #2 Start Date  03/15/15   THN CM Short Term Goal #2 Met Date  05/10/15   Interventions for Short Term Goal #2  Pt to call MD re wts.Take records to MD.  Continue daily wts.    THN CM Care Plan Problem Two        Patient Outreach Telephone from 05/10/2015 in [facility]   Care Plan Problem Two  -- [Exacerbation of uncontrolled chronic pain in lower back.]   THN CM Short Term Goal #1 (0-30 days)  -- [Pain control for chronic back pain improve within 14 days.]   THN CM Short Term Goal #1 Start Date  05/10/15   Interventions for Short Term Goal #2   -- [Education provided re pain management and to call MD.]      Assessment:  Patient reporting signs of worsening congestive heart failure.  Exacerbation of lower back pain not controlled with current pain regimen.  Plan:   Recommended that patient call her MD today and report symptoms.  She stated she would call and ask for an appointment to see the doctor. RN Health Coach will send MD a record of today's assessment, and will follow up with patient by phone within 10 working days.  Nikaya Nasby, RN, MSN [facility] Health Coach [phone] Fax [phone]   

## 2015-05-11 ENCOUNTER — Encounter: Payer: Self-pay | Admitting: Internal Medicine

## 2015-05-11 ENCOUNTER — Telehealth: Payer: Self-pay | Admitting: Internal Medicine

## 2015-05-11 NOTE — Telephone Encounter (Signed)
Note from [place] to patient Weight is up considerably Significant pulmonary HTN on echo  Will have her increase torsemide to 60bid---take dose this afternoon and then in AM Will see her tomorrow at 9:15AM to review

## 2015-05-12 ENCOUNTER — Ambulatory Visit (INDEPENDENT_AMBULATORY_CARE_PROVIDER_SITE_OTHER): Payer: Commercial Managed Care - HMO | Admitting: Internal Medicine

## 2015-05-12 ENCOUNTER — Encounter: Payer: Self-pay | Admitting: Internal Medicine

## 2015-05-12 VITALS — BP 130/72 | HR 87 | Temp 98.2°F | Wt 156.1 lb

## 2015-05-12 DIAGNOSIS — I270 Primary pulmonary hypertension: Secondary | ICD-10-CM

## 2015-05-12 DIAGNOSIS — M549 Dorsalgia, unspecified: Secondary | ICD-10-CM

## 2015-05-12 DIAGNOSIS — G8929 Other chronic pain: Secondary | ICD-10-CM | POA: Diagnosis not present

## 2015-05-12 DIAGNOSIS — I272 Pulmonary hypertension, unspecified: Secondary | ICD-10-CM

## 2015-05-12 MED ORDER — TORSEMIDE 20 MG PO TABS: 60.0000 mg | ORAL_TABLET | Freq: Two times a day (BID) | ORAL | Status: DC

## 2015-05-12 NOTE — Progress Notes (Signed)
Subjective:    Patient ID: Lindsay Harmon, female    DOB: 1955-11-08, 59 y.o.   MRN: [id number]  HPI Here with mom due to my call---follow up on the RN visit from [facility] Weight has been going up Having fatigue--- "I don't have the energy" compared to 2 months ago  Is careful with eating--no added salt Uses salt substitute Increased torsemide yesterday per my phone note Weight down 1.4# this AM  Breathing is okay Does note SOB if bending or stooping Stomach has felt bigger No chest pain No sig edema  Current Outpatient Prescriptions on File Prior to Visit  Medication Sig Dispense Refill  . ACCU-CHEK FASTCLIX LANCETS MISC Use to check blood sugar once daily dx: E11.49 100 each 3  . albuterol (PROVENTIL HFA;VENTOLIN HFA) 108 (90 BASE) MCG/ACT inhaler Inhale 2 puffs into the lungs every 6 (six) hours as needed for wheezing or shortness of breath. 1 Inhaler 2  . ALPRAZolam (XANAX) 0.25 MG tablet TAKE ONE (1) TABLET EACH DAY 90 tablet 0  . aspirin 325 MG tablet Take 325 mg by mouth daily.      . feeding supplement, ENSURE COMPLETE, (ENSURE COMPLETE) LIQD Take 237 mLs by mouth 2 (two) times daily between meals. 60 Bottle 0  . gabapentin (NEURONTIN) 300 MG capsule TAKE 1 CAPSULE FOUR TIMES DAILY 360 capsule 3  . glucose blood (ACCU-CHEK AVIVA PLUS) test strip Use as instructed to check blood sugar once daily dx: E11.49 100 each 3  . loratadine (CLARITIN) 10 MG tablet Take 10 mg by mouth 2 (two) times daily.     Marland Kitchen lovastatin (MEVACOR) 40 MG tablet TAKE 2 TABLETS EVERY DAY 180 tablet 3  . metFORMIN (GLUCOPHAGE) 1000 MG tablet Take 500 mg by mouth 2 (two) times daily with a meal.    . methadone (DOLOPHINE) 10 MG tablet Take 2 tablets four times a day. 240 tablet 0  . metoprolol tartrate (LOPRESSOR) 25 MG tablet Take 0.5 tablets (12.5 mg total) by mouth 2 (two) times daily. 60 tablet 0  . mirtazapine (REMERON) 30 MG tablet TAKE 1/2 TO 1 TABLET AT BEDTIME 90 tablet 3  . nitroGLYCERIN (NITROSTAT)  0.4 MG SL tablet Place 1 tablet (0.4 mg total) under the tongue every 5 (five) minutes as needed. 60 tablet 1  . pantoprazole (PROTONIX) 40 MG tablet TAKE 1 TABLET TWICE DAILY 180 tablet 3  . Tiotropium Bromide-Olodaterol (STIOLTO RESPIMAT) 2.5-2.5 MCG/ACT AERS Inhale 2 puffs into the lungs daily. 4 g 11  . torsemide (DEMADEX) 20 MG tablet TAKE 2 TABLETS TWICE DAILY 360 tablet 1   No current facility-administered medications on file prior to visit.    No Known Allergies  Past Medical History  Diagnosis Date  . Depression   . GERD (gastroesophageal reflux disease)   . Hyperlipidemia   . Hypertension   . PVD (peripheral vascular disease)   . CAD (coronary artery disease)   . Familial hematuria   . NIDDM (non-insulin dependent diabetes mellitus)     with neuropathy  . IBS (irritable bowel syndrome)   . Esophageal stricture   . Obesity, unspecified   . Nephrolithiasis   . Allergy   . Arthritis   . Pulmonary hypertension     Past Surgical History  Procedure Laterality Date  . Abdominal hysterectomy    . Cesarean section    . Carpal tunnel release    . Right heart catheterization N/A 12/05/2014    Procedure: RIGHT HEART CATH;  Surgeon: Lynnell Dike  Blenda Bridegroom, MD;  Location: [facility] CATH LAB;  Service: Cardiovascular;  Laterality: N/A;    Family History  Problem Relation Age of Onset  . Diabetes Mother   . Emphysema Paternal Grandfather   . Allergies Sister   . Allergies Father   . Cancer Mother     ovarian,melanoma  . Cancer Father     lung  . Cancer Maternal Grandmother     uterine    History   Social History  . Marital Status: Married    Spouse Name: N/A  . Number of Children: 1  . Years of Education: N/A   Occupational History  . 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 Smoking status: Former Smoker -- 1.50 packs/day for 20 years    Types: Cigarettes    Quit date: 01/04/2009  . Smokeless tobacco: Never Used  . Alcohol Use: No      Comment: heavy in the past  . Drug Use: No  . Sexual Activity: No   Other Topics Concern  . Not on file   Social History Narrative   No living will   Son [name] should make decisions for her if she is unable.    Would accept resuscitation attempts but no prolonged ventilation   Not sure about tube feeds but probably wouldn't want them if cognitively unaware   Review of Systems Sleep is not great but okay and no recent change-- 4-5 hours a night. Sleeps in chair Not really able to nap Does feel rested in the morning    Objective:   Physical Exam  Constitutional: She appears well-developed. No distress.  Neck: Normal range of motion. No JVD present.  Cardiovascular: Normal rate and normal heart sounds.   No murmur heard. Pulmonary/Chest: Effort normal and breath sounds normal. No respiratory distress. She has no wheezes. She has no rales.  Abdominal:  Mild distention without tenderness Unable to tell if ascites  Musculoskeletal:  Calves full but no true edema          Assessment & Plan:

## 2015-05-12 NOTE — Assessment & Plan Note (Addendum)
With increased weight that is probably fluid in her abdomen Some increasing vague fatigue Will increase the torsemide Goal weight probably about 147-148# Consider adding low dose spironolactone

## 2015-05-12 NOTE — Assessment & Plan Note (Signed)
Recently worse but no change in meds indicated at this point

## 2015-05-16 ENCOUNTER — Telehealth: Payer: Self-pay

## 2015-05-16 MED ORDER — SPIRONOLACTONE 25 MG PO TABS: 25.0000 mg | ORAL_TABLET | Freq: Every day | ORAL | Status: DC

## 2015-05-16 NOTE — Telephone Encounter (Signed)
Please call her I would like her to add spironolactone 39m daily to the torsemide. [facility] 2 weeks to review---- and I will do blood work then also

## 2015-05-16 NOTE — Telephone Encounter (Signed)
Pt let v/m; pt was seen 05/12/15 about fluid retention; pt has been taking med as instructed but pt lost 2 lbs in one day and has not lost any more weight;so since seen on 05/12/15 pt has lost total of 2 lbs. Pt does not feel med is effective as needs to be. Pt request cb.

## 2015-05-17 NOTE — Telephone Encounter (Signed)
Spoke with patient and advised results appt made

## 2015-05-17 NOTE — Telephone Encounter (Signed)
Pt left v/m requesting cb about fluid retention.

## 2015-05-31 ENCOUNTER — Ambulatory Visit (INDEPENDENT_AMBULATORY_CARE_PROVIDER_SITE_OTHER): Payer: Commercial Managed Care - HMO | Admitting: Internal Medicine

## 2015-05-31 ENCOUNTER — Encounter: Payer: Self-pay | Admitting: Internal Medicine

## 2015-05-31 VITALS — BP 130/60 | HR 86 | Temp 98.2°F | Wt 154.0 lb

## 2015-05-31 DIAGNOSIS — E1149 Type 2 diabetes mellitus with other diabetic neurological complication: Secondary | ICD-10-CM

## 2015-05-31 DIAGNOSIS — I270 Primary pulmonary hypertension: Secondary | ICD-10-CM | POA: Diagnosis not present

## 2015-05-31 DIAGNOSIS — E1140 Type 2 diabetes mellitus with diabetic neuropathy, unspecified: Secondary | ICD-10-CM | POA: Diagnosis not present

## 2015-05-31 DIAGNOSIS — J439 Emphysema, unspecified: Secondary | ICD-10-CM

## 2015-05-31 DIAGNOSIS — F39 Unspecified mood [affective] disorder: Secondary | ICD-10-CM

## 2015-05-31 DIAGNOSIS — I272 Pulmonary hypertension, unspecified: Secondary | ICD-10-CM

## 2015-05-31 LAB — RENAL FUNCTION PANEL
Albumin: 4.4 g/dL (ref 3.5–5.2)
BUN: 31 mg/dL — AB (ref 6–23)
CO2: 33 meq/L — AB (ref 19–32)
Calcium: 9.6 mg/dL (ref 8.4–10.5)
Chloride: 85 mEq/L — ABNORMAL LOW (ref 96–112)
Creatinine, Ser: 1.74 mg/dL — ABNORMAL HIGH (ref 0.40–1.20)
GFR: 31.56 mL/min — ABNORMAL LOW (ref >60.00)
GLUCOSE: 173 mg/dL — AB (ref 70–99)
PHOSPHORUS: 3.6 mg/dL (ref 2.3–4.6)
Potassium: 4.9 mEq/L (ref 3.5–5.1)
SODIUM: 125 meq/L — AB (ref 135–145)

## 2015-05-31 LAB — HEMOGLOBIN A1C: HEMOGLOBIN A1C: 7.1 % — AB (ref 4.6–6.5)

## 2015-05-31 MED ORDER — METHADONE HCL 10 MG PO TABS: ORAL_TABLET | ORAL | Status: DC

## 2015-05-31 MED ORDER — SPIRONOLACTONE 50 MG PO TABS: 50.0000 mg | ORAL_TABLET | Freq: Every day | ORAL | Status: DC

## 2015-05-31 NOTE — Assessment & Plan Note (Signed)
Not as good as her usual but probably still okay Due for A1c

## 2015-05-31 NOTE — Assessment & Plan Note (Signed)
Stable pulmonary status

## 2015-05-31 NOTE — Patient Instructions (Signed)
Please increase the spironolactone to 84m daily (you can use 2 of the 210mtabs till gone--then new prescription). If your weight is not coming down within 4-5 days, increase to 2 of the 5056mabs (100m58maily.

## 2015-05-31 NOTE — Progress Notes (Signed)
Pre visit review using our clinic review tool, if applicable. No additional management support is needed unless otherwise documented below in the visit note.

## 2015-05-31 NOTE — Assessment & Plan Note (Signed)
Frustrated but no persistent depression Sleeps well on mirtazapine

## 2015-05-31 NOTE — Progress Notes (Signed)
Subjective:    Patient ID: Lindsay Harmon, female    DOB: 07/15/1956, 59 y.o.   MRN: [id number]  HPI Here due to ongoing edema  On the torsemide 60 bid Spironolactone 68m daily Edema "makes me very uncomfortable" Still seems mostly in her belly Breathing is okay--careful not to bend over Sleeps in recliner and is upright. No PND  Sugars up some for her Generally under 140 No hypoglycemia  Appetite is off sometimes with the abdominal swelling Eats okay usually though  Mood generally okay Frustrated with her medical problems  Current Outpatient Prescriptions on File Prior to Visit  Medication Sig Dispense Refill  . ACCU-CHEK FASTCLIX LANCETS MISC Use to check blood sugar once daily dx: E11.49 100 each 3  . albuterol (PROVENTIL HFA;VENTOLIN HFA) 108 (90 BASE) MCG/ACT inhaler Inhale 2 puffs into the lungs every 6 (six) hours as needed for wheezing or shortness of breath. 1 Inhaler 2  . ALPRAZolam (XANAX) 0.25 MG tablet TAKE ONE (1) TABLET EACH DAY 90 tablet 0  . aspirin 325 MG tablet Take 325 mg by mouth daily.      .Marland Kitchengabapentin (NEURONTIN) 300 MG capsule TAKE 1 CAPSULE FOUR TIMES DAILY 360 capsule 3  . glucose blood (ACCU-CHEK AVIVA PLUS) test strip Use as instructed to check blood sugar once daily dx: E11.49 100 each 3  . loratadine (CLARITIN) 10 MG tablet Take 10 mg by mouth 2 (two) times daily.     .Marland Kitchenlovastatin (MEVACOR) 40 MG tablet TAKE 2 TABLETS EVERY DAY 180 tablet 3  . metFORMIN (GLUCOPHAGE) 1000 MG tablet Take 500 mg by mouth 2 (two) times daily with a meal.    . methadone (DOLOPHINE) 10 MG tablet Take 2 tablets four times a day. 240 tablet 0  . metoprolol tartrate (LOPRESSOR) 25 MG tablet Take 0.5 tablets (12.5 mg total) by mouth 2 (two) times daily. 60 tablet 0  . mirtazapine (REMERON) 30 MG tablet TAKE 1/2 TO 1 TABLET AT BEDTIME 90 tablet 3  . pantoprazole (PROTONIX) 40 MG tablet TAKE 1 TABLET TWICE DAILY 180 tablet 3  . spironolactone (ALDACTONE) 25 MG tablet Take  1 tablet (25 mg total) by mouth daily. 30 tablet 1  . Tiotropium Bromide-Olodaterol (STIOLTO RESPIMAT) 2.5-2.5 MCG/ACT AERS Inhale 2 puffs into the lungs daily. 4 g 11  . torsemide (DEMADEX) 20 MG tablet Take 3 tablets (60 mg total) by mouth 2 (two) times daily. 540 tablet 3   No current facility-administered medications on file prior to visit.    No Known Allergies  Past Medical History  Diagnosis Date  . Depression   . GERD (gastroesophageal reflux disease)   . Hyperlipidemia   . Hypertension   . PVD (peripheral vascular disease)   . CAD (coronary artery disease)   . Familial hematuria   . NIDDM (non-insulin dependent diabetes mellitus)     with neuropathy  . IBS (irritable bowel syndrome)   . Esophageal stricture   . Obesity, unspecified   . Nephrolithiasis   . Allergy   . Arthritis   . Pulmonary hypertension     Past Surgical History  Procedure Laterality Date  . Abdominal hysterectomy    . Cesarean section    . Carpal tunnel release    . Right heart catheterization N/A 12/05/2014    Procedure: RIGHT HEART CATH;  Surgeon: HSinclair Grooms MD;  [facility];  Service: Cardiovascular;  Laterality: N/A;    Family History  Problem Relation Age of  Onset  . Diabetes Mother   . Emphysema Paternal Grandfather   . Allergies Sister   . Allergies Father   . Cancer Mother     ovarian,melanoma  . Cancer Father     lung  . Cancer Maternal Grandmother     uterine    History   Social History  . Marital Status: Married    Spouse Name: N/A  . Number of Children: 1  . Years of Education: N/A   Occupational History  . 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 Smoking status: Former Smoker -- 1.50 packs/day for 20 years    Types: Cigarettes    Quit date: 01/04/2009  . Smokeless tobacco: Never Used  . Alcohol Use: No     Comment: heavy in the past  . Drug Use: No  . Sexual Activity: No   Other Topics Concern  . Not on file     Social History Narrative   No living will   Son [name] should make decisions for her if she is unable.    Would accept resuscitation attempts but no prolonged ventilation   Not sure about tube feeds but probably wouldn't want them if cognitively unaware   Review of Systems  Pain control is okay Voids okay--but not going as much as she used to     Objective:   Physical Exam  Constitutional: She appears well-developed. No distress.  Neck: No JVD present. No thyromegaly present.  Cardiovascular: Normal rate, regular rhythm and normal heart sounds.  Exam reveals no gallop.   No murmur heard. Pulmonary/Chest: Effort normal and breath sounds normal. No respiratory distress. She has no wheezes. She has no rales.  Abdominal:  Some distention but no tenderness          Assessment & Plan:

## 2015-05-31 NOTE — Assessment & Plan Note (Signed)
Not diuresing with current regimen Will recheck renal function and potassium Increase spironolactone to 50--then 100 May need to consider metolazone

## 2015-06-01 ENCOUNTER — Encounter (HOSPITAL_COMMUNITY): Payer: Self-pay

## 2015-06-01 ENCOUNTER — Ambulatory Visit (HOSPITAL_BASED_OUTPATIENT_CLINIC_OR_DEPARTMENT_OTHER)
Admission: RE | Admit: 2015-06-01 | Discharge: 2015-06-01 | Disposition: A | Discharge status: Home / Self Care | Payer: Commercial Managed Care - HMO | Source: Ambulatory Visit | Attending: Cardiology | Admitting: Cardiology

## 2015-06-01 ENCOUNTER — Other Ambulatory Visit (HOSPITAL_COMMUNITY): Payer: Self-pay | Admitting: Internal Medicine

## 2015-06-01 VITALS — BP 128/76 | HR 91 | Wt 157.1 lb

## 2015-06-01 DIAGNOSIS — I2781 Cor pulmonale (chronic): Secondary | ICD-10-CM

## 2015-06-01 DIAGNOSIS — I509 Heart failure, unspecified: Secondary | ICD-10-CM | POA: Diagnosis not present

## 2015-06-01 DIAGNOSIS — Z7982 Long term (current) use of aspirin: Secondary | ICD-10-CM | POA: Insufficient documentation

## 2015-06-01 DIAGNOSIS — E871 Hypo-osmolality and hyponatremia: Secondary | ICD-10-CM | POA: Diagnosis not present

## 2015-06-01 DIAGNOSIS — J449 Chronic obstructive pulmonary disease, unspecified: Secondary | ICD-10-CM | POA: Insufficient documentation

## 2015-06-01 DIAGNOSIS — Z833 Family history of diabetes mellitus: Secondary | ICD-10-CM

## 2015-06-01 DIAGNOSIS — I270 Primary pulmonary hypertension: Secondary | ICD-10-CM | POA: Diagnosis not present

## 2015-06-01 DIAGNOSIS — I2510 Atherosclerotic heart disease of native coronary artery without angina pectoris: Secondary | ICD-10-CM | POA: Insufficient documentation

## 2015-06-01 DIAGNOSIS — K589 Irritable bowel syndrome without diarrhea: Secondary | ICD-10-CM | POA: Insufficient documentation

## 2015-06-01 DIAGNOSIS — I50810 Right heart failure, unspecified: Secondary | ICD-10-CM

## 2015-06-01 DIAGNOSIS — Z87891 Personal history of nicotine dependence: Secondary | ICD-10-CM | POA: Insufficient documentation

## 2015-06-01 DIAGNOSIS — K219 Gastro-esophageal reflux disease without esophagitis: Secondary | ICD-10-CM | POA: Insufficient documentation

## 2015-06-01 DIAGNOSIS — E669 Obesity, unspecified: Secondary | ICD-10-CM | POA: Insufficient documentation

## 2015-06-01 DIAGNOSIS — E1140 Type 2 diabetes mellitus with diabetic neuropathy, unspecified: Secondary | ICD-10-CM

## 2015-06-01 DIAGNOSIS — E785 Hyperlipidemia, unspecified: Secondary | ICD-10-CM | POA: Insufficient documentation

## 2015-06-01 DIAGNOSIS — I739 Peripheral vascular disease, unspecified: Secondary | ICD-10-CM

## 2015-06-01 DIAGNOSIS — I5032 Chronic diastolic (congestive) heart failure: Secondary | ICD-10-CM

## 2015-06-01 DIAGNOSIS — I5021 Acute systolic (congestive) heart failure: Secondary | ICD-10-CM

## 2015-06-01 DIAGNOSIS — Z79899 Other long term (current) drug therapy: Secondary | ICD-10-CM

## 2015-06-01 DIAGNOSIS — I10 Essential (primary) hypertension: Secondary | ICD-10-CM | POA: Insufficient documentation

## 2015-06-01 DIAGNOSIS — I272 Pulmonary hypertension, unspecified: Secondary | ICD-10-CM

## 2015-06-01 DIAGNOSIS — Z9981 Dependence on supplemental oxygen: Secondary | ICD-10-CM

## 2015-06-01 DIAGNOSIS — R109 Unspecified abdominal pain: Secondary | ICD-10-CM | POA: Diagnosis not present

## 2015-06-01 MED ORDER — POTASSIUM CHLORIDE ER 20 MEQ PO TBCR: 10.0000 meq | EXTENDED_RELEASE_TABLET | ORAL | Status: DC | PRN

## 2015-06-01 MED ORDER — METOLAZONE 2.5 MG PO TABS: 2.5000 mg | ORAL_TABLET | ORAL | Status: DC | PRN

## 2015-06-01 NOTE — Addendum Note (Signed)
Encounter addended by: Harvie Junior, CMA on: 06/01/2015 12:02 PM<BR>     Documentation filed: Patient Instructions Section, Orders

## 2015-06-01 NOTE — Patient Instructions (Signed)
TAKE Metolazone 2.5 mg (1 tablet) TODAY AND TOMORROW... Then TAKE AS NEEDED for fluid...DO NOT TAKE MORE THAN ONCE A WEEK.  TAKE Potassium 20 meq (1 tablet) WHEN YOU TAKE METOLAZONE.  LABS IN 1 WEEK (bmet)  FOLLOW UP IN 4 WEEKS.

## 2015-06-01 NOTE — Progress Notes (Signed)
Patient ID: CHASE KNEBEL, female   DOB: July 25, 1956, 59 y.o.   MRN: [id number] PCP: Dr. Silvio Pate  HPI: Ms Galiano is a 59 year old with history of HTN, DM, IBS, depression, GERD, severe COPD and pulmonary hypertension with RV failure/cor pulmonale with admit to [facility] in February 2016  with volume overload and worsening renal function. She was diuresed with IV lasix and transitioned to torsemide. She also had RHC as noted below as well as PFTs.   She is stable.  She continues home oxygen for COPD, 2L rest/3L exertion.  She has stable DOE.  No orthopnea/PND.  Feels she is getting more and more bloated. Has gained 25 pounds over past 6 months but weight stable over last few weeks. No palpitations, no chest pain.  No lightheadedness or syncope.  Dr. Silvio Pate increased spiro yesterday.   RHC 12/05/2014 RA 12 mmHg (mean) with O2 sat 72%;  RV 97/16 mmHg with O2 sat 73%;  PA 97/30 mmHg with O2 sat 67%;  PCWP(mean) 14 mmHg (mean);  Cardiac Output 5.78 L/min  PFTs 2/92016 with severe COPD  FEV1 0.87 (38%) FVC 1.41 (48%) FEF 25-75 0.41 (19%) Unable to do DLCO  Echo (2/16) EF 60-65%, aortic sclerosis, RV moderately dilated/moderately decreased systolic function, PASP 71 mmHg  Labs 12/13/2014: K 4.3 Creatinine 1.44, HCT 45.7  ROS: All systems negative except as listed in HPI, PMH and Problem List.  SH:  History   Social History  . Marital Status: Married    Spouse Name: N/A  . Number of Children: 1  . Years of Education: N/A   Occupational History  . 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 Smoking status: Former Smoker -- 1.50 packs/day for 20 years    Types: Cigarettes    Quit date: 01/04/2009  . Smokeless tobacco: Never Used  . Alcohol Use: No     Comment: heavy in the past  . Drug Use: No  . Sexual Activity: No   Other Topics Concern  . Not on file   Social History Narrative   No living will   Son [name] should make decisions for her if she is  unable.    Would accept resuscitation attempts but no prolonged ventilation   Not sure about tube feeds but probably wouldn't want them if cognitively unaware    FH:  Family History  Problem Relation Age of Onset  . Diabetes Mother   . Emphysema Paternal Grandfather   . Allergies Sister   . Allergies Father   . Cancer Mother     ovarian,melanoma  . Cancer Father     lung  . Cancer Maternal Grandmother     uterine    Past Medical History  Diagnosis Date  . Depression   . GERD (gastroesophageal reflux disease)   . Hyperlipidemia   . Hypertension   . PVD (peripheral vascular disease)   . CAD (coronary artery disease)   . Familial hematuria   . NIDDM (non-insulin dependent diabetes mellitus)     with neuropathy  . IBS (irritable bowel syndrome)   . Esophageal stricture   . Obesity, unspecified   . Nephrolithiasis   . Allergy   . Arthritis   . Pulmonary hypertension     Current Outpatient Prescriptions  Medication Sig Dispense Refill  . ACCU-CHEK FASTCLIX LANCETS MISC Use to check blood sugar once daily dx: E11.49 100 each 3  . albuterol (PROVENTIL HFA;VENTOLIN HFA) 108 (90 BASE)  MCG/ACT inhaler Inhale 2 puffs into the lungs every 6 (six) hours as needed for wheezing or shortness of breath. 1 Inhaler 2  . ALPRAZolam (XANAX) 0.25 MG tablet TAKE ONE (1) TABLET EACH DAY 90 tablet 0  . aspirin 325 MG tablet Take 325 mg by mouth daily.      Marland Kitchen gabapentin (NEURONTIN) 300 MG capsule TAKE 1 CAPSULE FOUR TIMES DAILY 360 capsule 3  . loratadine (CLARITIN) 10 MG tablet Take 10 mg by mouth 2 (two) times daily.     Marland Kitchen lovastatin (MEVACOR) 40 MG tablet TAKE 2 TABLETS EVERY DAY 180 tablet 3  . metFORMIN (GLUCOPHAGE) 1000 MG tablet Take 500 mg by mouth 2 (two) times daily with a meal.    . methadone (DOLOPHINE) 10 MG tablet Take 2 tablets four times a day. 240 tablet 0  . metoprolol tartrate (LOPRESSOR) 25 MG tablet Take 0.5 tablets (12.5 mg total) by mouth 2 (two) times daily. 60 tablet  0  . mirtazapine (REMERON) 30 MG tablet TAKE 1/2 TO 1 TABLET AT BEDTIME 90 tablet 3  . pantoprazole (PROTONIX) 40 MG tablet TAKE 1 TABLET TWICE DAILY 180 tablet 3  . spironolactone (ALDACTONE) 50 MG tablet Take 1-2 tablets (50-100 mg total) by mouth daily. (Patient taking differently: Take 50 mg by mouth daily. ) 60 tablet 5  . Tiotropium Bromide-Olodaterol (STIOLTO RESPIMAT) 2.5-2.5 MCG/ACT AERS Inhale 2 puffs into the lungs daily. 4 g 11  . torsemide (DEMADEX) 20 MG tablet Take 3 tablets (60 mg total) by mouth 2 (two) times daily. 540 tablet 3  . glucose blood (ACCU-CHEK AVIVA PLUS) test strip Use as instructed to check blood sugar once daily dx: E11.49 100 each 3   No current facility-administered medications for this encounter.    Filed Vitals:   06/01/15 1111  BP: 128/76  Pulse: 91  Weight: 157 lb 2 oz (71.271 kg)  SpO2: 90%    PHYSICAL EXAM:  General: Chronically ill appearing. No resp difficulty. Mom present HEENT: normal Neck: supple. JVD to jaw.  Carotids 2+ bilat; no bruits. No lymphadenopathy or thryomegaly appreciated. Cor: PMI nondisplaced. regular. No rubs, gallops. Prominent P2. 1/6 HSM LLSB.  Lungs: decreased throughout Abdomen: soft, nontender, + distended. No hepatosplenomegaly. No bruits or masses. Good bowel sounds. Extremities: no cyanosis, + clubbing, 1+ edema to knees bilaterally Neuro: alert & oriented x 3, cranial nerves grossly intact. moves all 4 extremities w/o difficulty. Affect pleasant   ASSESSMENT & PLAN: 1. RV failure/cor pulmonale: NYHA III symptoms likely combination of COPD and CHF. Volume status mildly elevated - Continue torsemide 40 mg bid. Will give metolazone 2.5 mg for 2 days then can use prn up to once a week. Give with KCL 20 - Wear compression stockings.  - BMET 1 week with Dr. Silvio Pate 2. COPD:  PFTs FEV1 0.87 (38%), FVC 1.41 (48%), FEF 25-75 0.41 (19%), severe COPD on home oxygen.  This is a major contributor to her dyspnea.  -  Continue home oxygen, follows with [name]. 3. Suspected OSA : Sleeps study negative in 4/16 4. PAH: Severe, WHO group III most likely from severe parenchymal lung disease (COPD). Not a good candidate for selective pulmonary vasodilators. Treatment at this time will be oxygen and diuresis.   Bensimhon, Daniel,MD 11:52 AM

## 2015-06-02 ENCOUNTER — Telehealth (HOSPITAL_COMMUNITY): Payer: Self-pay | Admitting: *Deleted

## 2015-06-02 ENCOUNTER — Emergency Department (HOSPITAL_COMMUNITY): Payer: Commercial Managed Care - HMO

## 2015-06-02 ENCOUNTER — Inpatient Hospital Stay (HOSPITAL_COMMUNITY)
Admission: EM | Admit: 2015-06-02 | Discharge: 2015-06-09 | DRG: 640 | Disposition: A | Discharge status: Home Health | Payer: Commercial Managed Care - HMO | Attending: Internal Medicine | Admitting: Internal Medicine

## 2015-06-02 ENCOUNTER — Inpatient Hospital Stay (HOSPITAL_COMMUNITY): Payer: Commercial Managed Care - HMO

## 2015-06-02 ENCOUNTER — Encounter (HOSPITAL_COMMUNITY): Payer: Self-pay | Admitting: Emergency Medicine

## 2015-06-02 ENCOUNTER — Encounter: Payer: Self-pay | Admitting: *Deleted

## 2015-06-02 ENCOUNTER — Other Ambulatory Visit (HOSPITAL_COMMUNITY): Payer: Self-pay | Admitting: Internal Medicine

## 2015-06-02 ENCOUNTER — Other Ambulatory Visit (INDEPENDENT_AMBULATORY_CARE_PROVIDER_SITE_OTHER): Payer: Commercial Managed Care - HMO

## 2015-06-02 DIAGNOSIS — N183 Chronic kidney disease, stage 3 unspecified: Secondary | ICD-10-CM

## 2015-06-02 DIAGNOSIS — I272 Other secondary pulmonary hypertension: Secondary | ICD-10-CM | POA: Diagnosis present

## 2015-06-02 DIAGNOSIS — E1122 Type 2 diabetes mellitus with diabetic chronic kidney disease: Secondary | ICD-10-CM | POA: Diagnosis present

## 2015-06-02 DIAGNOSIS — I739 Peripheral vascular disease, unspecified: Secondary | ICD-10-CM | POA: Diagnosis present

## 2015-06-02 DIAGNOSIS — J9611 Chronic respiratory failure with hypoxia: Secondary | ICD-10-CM | POA: Diagnosis not present

## 2015-06-02 DIAGNOSIS — K219 Gastro-esophageal reflux disease without esophagitis: Secondary | ICD-10-CM | POA: Diagnosis present

## 2015-06-02 DIAGNOSIS — I509 Heart failure, unspecified: Secondary | ICD-10-CM | POA: Diagnosis not present

## 2015-06-02 DIAGNOSIS — I5033 Acute on chronic diastolic (congestive) heart failure: Secondary | ICD-10-CM | POA: Diagnosis present

## 2015-06-02 DIAGNOSIS — Z9981 Dependence on supplemental oxygen: Secondary | ICD-10-CM | POA: Diagnosis not present

## 2015-06-02 DIAGNOSIS — I2781 Cor pulmonale (chronic): Secondary | ICD-10-CM | POA: Diagnosis present

## 2015-06-02 DIAGNOSIS — I129 Hypertensive chronic kidney disease with stage 1 through stage 4 chronic kidney disease, or unspecified chronic kidney disease: Secondary | ICD-10-CM | POA: Diagnosis present

## 2015-06-02 DIAGNOSIS — E669 Obesity, unspecified: Secondary | ICD-10-CM | POA: Diagnosis present

## 2015-06-02 DIAGNOSIS — Z87891 Personal history of nicotine dependence: Secondary | ICD-10-CM | POA: Diagnosis not present

## 2015-06-02 DIAGNOSIS — N184 Chronic kidney disease, stage 4 (severe): Secondary | ICD-10-CM | POA: Diagnosis present

## 2015-06-02 DIAGNOSIS — R109 Unspecified abdominal pain: Secondary | ICD-10-CM | POA: Diagnosis present

## 2015-06-02 DIAGNOSIS — E871 Hypo-osmolality and hyponatremia: Principal | ICD-10-CM | POA: Diagnosis present

## 2015-06-02 DIAGNOSIS — E876 Hypokalemia: Secondary | ICD-10-CM | POA: Diagnosis not present

## 2015-06-02 DIAGNOSIS — E785 Hyperlipidemia, unspecified: Secondary | ICD-10-CM | POA: Diagnosis present

## 2015-06-02 DIAGNOSIS — Z6829 Body mass index (BMI) 29.0-29.9, adult: Secondary | ICD-10-CM

## 2015-06-02 DIAGNOSIS — R188 Other ascites: Secondary | ICD-10-CM | POA: Diagnosis present

## 2015-06-02 DIAGNOSIS — N179 Acute kidney failure, unspecified: Secondary | ICD-10-CM | POA: Diagnosis not present

## 2015-06-02 DIAGNOSIS — I2510 Atherosclerotic heart disease of native coronary artery without angina pectoris: Secondary | ICD-10-CM | POA: Diagnosis present

## 2015-06-02 DIAGNOSIS — G4733 Obstructive sleep apnea (adult) (pediatric): Secondary | ICD-10-CM | POA: Diagnosis present

## 2015-06-02 DIAGNOSIS — I5032 Chronic diastolic (congestive) heart failure: Secondary | ICD-10-CM | POA: Diagnosis present

## 2015-06-02 DIAGNOSIS — J449 Chronic obstructive pulmonary disease, unspecified: Secondary | ICD-10-CM | POA: Diagnosis present

## 2015-06-02 DIAGNOSIS — R1900 Intra-abdominal and pelvic swelling, mass and lump, unspecified site: Secondary | ICD-10-CM

## 2015-06-02 DIAGNOSIS — E1165 Type 2 diabetes mellitus with hyperglycemia: Secondary | ICD-10-CM | POA: Diagnosis present

## 2015-06-02 DIAGNOSIS — Z7982 Long term (current) use of aspirin: Secondary | ICD-10-CM

## 2015-06-02 DIAGNOSIS — K589 Irritable bowel syndrome without diarrhea: Secondary | ICD-10-CM | POA: Diagnosis present

## 2015-06-02 DIAGNOSIS — I270 Primary pulmonary hypertension: Secondary | ICD-10-CM | POA: Diagnosis not present

## 2015-06-02 DIAGNOSIS — IMO0002 Reserved for concepts with insufficient information to code with codable children: Secondary | ICD-10-CM

## 2015-06-02 DIAGNOSIS — N189 Chronic kidney disease, unspecified: Secondary | ICD-10-CM | POA: Diagnosis not present

## 2015-06-02 DIAGNOSIS — J439 Emphysema, unspecified: Secondary | ICD-10-CM

## 2015-06-02 LAB — CBC WITH DIFFERENTIAL/PLATELET
BASOS PCT: 0 % (ref 0–1)
Basophils Absolute: 0 10*3/uL (ref 0.0–0.1)
EOS ABS: 0.4 10*3/uL (ref 0.0–0.7)
EOS PCT: 3 % (ref 0–5)
HCT: 40.5 % (ref 36.0–46.0)
Hemoglobin: 14.1 g/dL (ref 12.0–15.0)
LYMPHS ABS: 2.4 10*3/uL (ref 0.7–4.0)
Lymphocytes Relative: 24 % (ref 12–46)
MCH: 30.2 pg (ref 26.0–34.0)
MCHC: 34.8 g/dL (ref 30.0–36.0)
MCV: 86.7 fL (ref 78.0–100.0)
Monocytes Absolute: 0.8 10*3/uL (ref 0.1–1.0)
Monocytes Relative: 8 % (ref 3–12)
Neutro Abs: 6.7 10*3/uL (ref 1.7–7.7)
Neutrophils Relative %: 65 % (ref 43–77)
Platelets: 241 10*3/uL (ref 150–400)
RBC: 4.67 MIL/uL (ref 3.87–5.11)
RDW: 12.8 % (ref 11.5–15.5)
WBC: 10.3 10*3/uL (ref 4.0–10.5)

## 2015-06-02 LAB — CBC
HEMATOCRIT: 38.8 % (ref 36.0–46.0)
HEMOGLOBIN: 13.8 g/dL (ref 12.0–15.0)
MCH: 30.1 pg (ref 26.0–34.0)
MCHC: 35.6 g/dL (ref 30.0–36.0)
MCV: 84.7 fL (ref 78.0–100.0)
Platelets: 244 10*3/uL (ref 150–400)
RBC: 4.58 MIL/uL (ref 3.87–5.11)
RDW: 12.6 % (ref 11.5–15.5)
WBC: 9.2 10*3/uL (ref 4.0–10.5)

## 2015-06-02 LAB — BASIC METABOLIC PANEL
Anion gap: 15 (ref 5–15)
Anion gap: 12 (ref 5–15)
BUN: 37 mg/dL — ABNORMAL HIGH (ref 6–23)
BUN: 36 mg/dL — AB (ref 6–20)
BUN: 35 mg/dL — AB (ref 6–20)
CALCIUM: 9.9 mg/dL (ref 8.4–10.5)
CALCIUM: 9.3 mg/dL (ref 8.9–10.3)
CALCIUM: 9.4 mg/dL (ref 8.9–10.3)
CHLORIDE: 77 meq/L — AB (ref 96–112)
CO2: 36 mEq/L — ABNORMAL HIGH (ref 19–32)
CO2: 28 mmol/L (ref 22–32)
CO2: 32 mmol/L (ref 22–32)
CREATININE: 1.86 mg/dL — AB (ref 0.44–1.00)
Chloride: 76 mmol/L — ABNORMAL LOW (ref 101–111)
Chloride: 76 mmol/L — ABNORMAL LOW (ref 101–111)
Creatinine, Ser: 1.89 mg/dL — ABNORMAL HIGH (ref 0.40–1.20)
Creatinine, Ser: 1.93 mg/dL — ABNORMAL HIGH (ref 0.44–1.00)
GFR calc Af Amer: 33 mL/min — ABNORMAL LOW (ref >60)
GFR, EST AFRICAN AMERICAN: 31 mL/min — AB (ref >60)
GFR, EST NON AFRICAN AMERICAN: 28 mL/min — AB (ref >60)
GFR, EST NON AFRICAN AMERICAN: 27 mL/min — AB (ref >60)
GFR: 28.69 mL/min — AB (ref >60.00)
GLUCOSE: 123 mg/dL — AB (ref 70–99)
GLUCOSE: 219 mg/dL — AB (ref 65–99)
Glucose, Bld: 135 mg/dL — ABNORMAL HIGH (ref 65–99)
POTASSIUM: 5.4 meq/L — AB (ref 3.5–5.1)
POTASSIUM: 5.1 mmol/L (ref 3.5–5.1)
Potassium: 4.6 mmol/L (ref 3.5–5.1)
SODIUM: 120 mmol/L — AB (ref 135–145)
Sodium: 119 mEq/L — CL (ref 135–145)
Sodium: 119 mmol/L — CL (ref 135–145)

## 2015-06-02 LAB — I-STAT TROPONIN, ED: Troponin i, poc: 0 ng/mL (ref 0.00–0.08)

## 2015-06-02 LAB — PHOSPHORUS: Phosphorus: 4.4 mg/dL (ref 2.5–4.6)

## 2015-06-02 LAB — OSMOLALITY, URINE: Osmolality, Ur: 230 mOsm/kg — ABNORMAL LOW (ref 390–1090)

## 2015-06-02 LAB — TSH: TSH: 2.708 u[IU]/mL (ref 0.350–4.500)

## 2015-06-02 LAB — SODIUM, URINE, RANDOM: SODIUM UR: 69 mmol/L

## 2015-06-02 LAB — GLUCOSE, CAPILLARY: GLUCOSE-CAPILLARY: 147 mg/dL — AB (ref 65–99)

## 2015-06-02 LAB — BRAIN NATRIURETIC PEPTIDE: B Natriuretic Peptide: 21.5 pg/mL (ref 0.0–100.0)

## 2015-06-02 LAB — CORTISOL: CORTISOL PLASMA: 22.9 ug/dL

## 2015-06-02 LAB — MAGNESIUM: Magnesium: 1.9 mg/dL (ref 1.7–2.4)

## 2015-06-02 MED ORDER — ACETAMINOPHEN 325 MG PO TABS
650.0000 mg | ORAL_TABLET | Freq: Four times a day (QID) | ORAL | Status: DC | PRN
Start: 2015-06-02 — End: 2015-06-09
  Administered 2015-06-03 – 2015-06-04 (×3): 650 mg via ORAL
  Filled 2015-06-02 (×4): qty 2

## 2015-06-02 MED ORDER — MIRTAZAPINE 15 MG PO TABS
15.0000 mg | ORAL_TABLET | Freq: Every day | ORAL | Status: DC
  Administered 2015-06-02 – 2015-06-05 (×4): 15 mg via ORAL
  Filled 2015-06-02 (×5): qty 1

## 2015-06-02 MED ORDER — METHADONE HCL 10 MG PO TABS
20.0000 mg | ORAL_TABLET | Freq: Four times a day (QID) | ORAL | Status: DC
  Administered 2015-06-03 – 2015-06-09 (×27): 20 mg via ORAL
  Filled 2015-06-02 (×28): qty 2

## 2015-06-02 MED ORDER — ENOXAPARIN SODIUM 40 MG/0.4ML ~~LOC~~ SOLN
40.0000 mg | SUBCUTANEOUS | Status: DC
  Administered 2015-06-02: 40 mg via SUBCUTANEOUS
  Filled 2015-06-02 (×2): qty 0.4

## 2015-06-02 MED ORDER — GABAPENTIN 300 MG PO CAPS
300.0000 mg | ORAL_CAPSULE | Freq: Every day | ORAL | Status: DC
  Administered 2015-06-02 – 2015-06-09 (×8): 300 mg via ORAL
  Filled 2015-06-02 (×12): qty 1

## 2015-06-02 MED ORDER — ACETAMINOPHEN 650 MG RE SUPP
650.0000 mg | Freq: Four times a day (QID) | RECTAL | Status: DC | PRN
Start: 2015-06-02 — End: 2015-06-09

## 2015-06-02 MED ORDER — ONDANSETRON HCL 4 MG PO TABS
4.0000 mg | ORAL_TABLET | Freq: Four times a day (QID) | ORAL | Status: DC | PRN
  Administered 2015-06-05: 4 mg via ORAL
  Filled 2015-06-02: qty 1

## 2015-06-02 MED ORDER — ALBUTEROL SULFATE (2.5 MG/3ML) 0.083% IN NEBU: 2.0000 mL | INHALATION_SOLUTION | Freq: Four times a day (QID) | RESPIRATORY_TRACT | Status: DC | PRN

## 2015-06-02 MED ORDER — METOPROLOL TARTRATE 12.5 MG HALF TABLET
12.5000 mg | ORAL_TABLET | Freq: Two times a day (BID) | ORAL | Status: DC
  Administered 2015-06-02 – 2015-06-09 (×13): 12.5 mg via ORAL
  Filled 2015-06-02 (×19): qty 1

## 2015-06-02 MED ORDER — INSULIN ASPART 100 UNIT/ML ~~LOC~~ SOLN
0.0000 [IU] | Freq: Three times a day (TID) | SUBCUTANEOUS | Status: DC
  Administered 2015-06-03 – 2015-06-04 (×5): 2 [IU] via SUBCUTANEOUS
  Administered 2015-06-05: 1 [IU] via SUBCUTANEOUS
  Administered 2015-06-05: 2 [IU] via SUBCUTANEOUS
  Administered 2015-06-05: 5 [IU] via SUBCUTANEOUS
  Administered 2015-06-06: 1 [IU] via SUBCUTANEOUS
  Administered 2015-06-06: 2 [IU] via SUBCUTANEOUS
  Administered 2015-06-06 – 2015-06-07 (×3): 3 [IU] via SUBCUTANEOUS
  Administered 2015-06-08 (×2): 1 [IU] via SUBCUTANEOUS
  Administered 2015-06-08: 5 [IU] via SUBCUTANEOUS
  Administered 2015-06-09: 2 [IU] via SUBCUTANEOUS
  Administered 2015-06-09: 3 [IU] via SUBCUTANEOUS

## 2015-06-02 MED ORDER — FUROSEMIDE 10 MG/ML IJ SOLN
60.0000 mg | Freq: Two times a day (BID) | INTRAMUSCULAR | Status: DC
  Administered 2015-06-02 – 2015-06-03 (×2): 60 mg via INTRAVENOUS
  Filled 2015-06-02 (×4): qty 6

## 2015-06-02 MED ORDER — ASPIRIN 325 MG PO TABS
325.0000 mg | ORAL_TABLET | Freq: Every day | ORAL | Status: DC
  Administered 2015-06-02 – 2015-06-09 (×8): 325 mg via ORAL
  Filled 2015-06-02 (×12): qty 1

## 2015-06-02 MED ORDER — SODIUM CHLORIDE 0.9 % IJ SOLN
3.0000 mL | Freq: Two times a day (BID) | INTRAMUSCULAR | Status: DC
  Administered 2015-06-02 – 2015-06-09 (×13): 3 mL via INTRAVENOUS

## 2015-06-02 MED ORDER — LORATADINE 10 MG PO TABS
10.0000 mg | ORAL_TABLET | Freq: Two times a day (BID) | ORAL | Status: DC
  Administered 2015-06-02 – 2015-06-09 (×14): 10 mg via ORAL
  Filled 2015-06-02 (×20): qty 1

## 2015-06-02 MED ORDER — TIOTROPIUM BROMIDE-OLODATEROL 2.5-2.5 MCG/ACT IN AERS: 2.0000 | INHALATION_SPRAY | Freq: Every day | RESPIRATORY_TRACT | Status: DC

## 2015-06-02 MED ORDER — PANTOPRAZOLE SODIUM 40 MG PO TBEC
40.0000 mg | DELAYED_RELEASE_TABLET | Freq: Two times a day (BID) | ORAL | Status: DC
  Administered 2015-06-02 – 2015-06-09 (×14): 40 mg via ORAL
  Filled 2015-06-02 (×15): qty 1

## 2015-06-02 MED ORDER — PRAVASTATIN SODIUM 80 MG PO TABS
80.0000 mg | ORAL_TABLET | Freq: Every day | ORAL | Status: DC
  Administered 2015-06-03 – 2015-06-08 (×6): 80 mg via ORAL
  Filled 2015-06-02 (×8): qty 1

## 2015-06-02 MED ORDER — ONDANSETRON HCL 4 MG/2ML IJ SOLN
4.0000 mg | Freq: Four times a day (QID) | INTRAMUSCULAR | Status: DC | PRN
  Administered 2015-06-02 – 2015-06-07 (×3): 4 mg via INTRAVENOUS
  Filled 2015-06-02 (×3): qty 2

## 2015-06-02 MED ORDER — ALPRAZOLAM 0.25 MG PO TABS: 0.2500 mg | ORAL_TABLET | Freq: Every day | ORAL | Status: DC | PRN

## 2015-06-02 MED ORDER — POTASSIUM CHLORIDE ER 20 MEQ PO TBCR: EXTENDED_RELEASE_TABLET | ORAL | Status: DC

## 2015-06-02 MED ORDER — CETYLPYRIDINIUM CHLORIDE 0.05 % MT LIQD
7.0000 mL | Freq: Two times a day (BID) | OROMUCOSAL | Status: DC
  Administered 2015-06-02 – 2015-06-09 (×12): 7 mL via OROMUCOSAL

## 2015-06-02 NOTE — ED Notes (Signed)
Pt sent here by PCP for eval of abnormal labs that pt is unsure what is and some joint pain; pt sts some exertional SOB and is on home O2 all the time; pt sts fluid retention in abd area; no swelling noted in legs

## 2015-06-02 NOTE — H&P (Signed)
[facility] History and Physical  Lindsay Harmon MNO:[id number] DOB: 05-27-1956 DOA: 06/02/2015  Referring physician: Dr. Alvino Chapel. PCP: Viviana Simpler, MD  Specialists: Dr. Tempie Hoist.  Chief Complaint: Low sodium.  HPI: Lindsay Harmon is a 59 y.o. female with history of cor pulmonale, diastolic CHF, COPD, diabetes mellitus and chronic kidney disease was referred to the ER after patient's blood work showed that patient had sodium of 119. Patient is otherwise asymptomatic. Patient's sodium was reconfirmed in the ER to be 119. Patient denies having had excessive fluid intake. Patient was recently placed on metolazone last 2 days for her CHF. Patient also was recently started on spironolactone. Patient takes torsemide. Despite which patient states he thinks he is gaining weight and has abdominal distention. On exam patient has mild lower extremity edema. Chest x-ray shows features concerning for CHF. On exam patient is not in distress. Patient has been admitted for severe hyponatremia.   Review of Systems: As presented in the history of presenting illness, rest negative.  Past Medical History  Diagnosis Date  . Depression   . GERD (gastroesophageal reflux disease)   . Hyperlipidemia   . Hypertension   . PVD (peripheral vascular disease)   . CAD (coronary artery disease)   . Familial hematuria   . NIDDM (non-insulin dependent diabetes mellitus)     with neuropathy  . IBS (irritable bowel syndrome)   . Esophageal stricture   . Obesity, unspecified   . Nephrolithiasis   . Allergy   . Arthritis   . Pulmonary hypertension    Past Surgical History  Procedure Laterality Date  . Abdominal hysterectomy    . Cesarean section    . Carpal tunnel release    . Right heart catheterization N/A 12/05/2014    Procedure: RIGHT HEART CATH;  Surgeon: Sinclair Grooms, MD;  Location: [facility] CATH LAB;  Service: Cardiovascular;  Laterality: N/A;   Social History:  reports that she quit smoking about  6 years ago. Her smoking use included Cigarettes. She has a 30 pack-year smoking history. She has never used smokeless tobacco. She reports that she does not drink alcohol or use illicit drugs. Where does patient live home. Can patient participate in ADLs? Yes.  No Known Allergies  Family History:  Family History  Problem Relation Age of Onset  . Diabetes Mother   . Emphysema Paternal Grandfather   . Allergies Sister   . Allergies Father   . Cancer Mother     ovarian,melanoma  . Cancer Father     lung  . Cancer Maternal Grandmother     uterine      Prior to Admission medications   Medication Sig Start Date End Date Taking? Authorizing Provider  ACCU-CHEK FASTCLIX LANCETS MISC Use to check blood sugar once daily dx: E11.49 09/07/14   Venia Carbon, MD  albuterol (PROVENTIL HFA;VENTOLIN HFA) 108 (90 BASE) MCG/ACT inhaler Inhale 2 puffs into the lungs every 6 (six) hours as needed for wheezing or shortness of breath. 01/09/15   Juanito Doom, MD  ALPRAZolam Duanne Moron) 0.25 MG tablet TAKE ONE (1) TABLET EACH DAY 03/31/15   Venia Carbon, MD  aspirin 325 MG tablet Take 325 mg by mouth daily.      Historical Provider, MD  gabapentin (NEURONTIN) 300 MG capsule TAKE 1 CAPSULE FOUR TIMES DAILY 11/08/14   Venia Carbon, MD  glucose blood (ACCU-CHEK AVIVA PLUS) test strip Use as instructed to check blood sugar once daily dx: E11.49 09/07/14  Venia Carbon, MD  loratadine (CLARITIN) 10 MG tablet Take 10 mg by mouth 2 (two) times daily.     Historical Provider, MD  lovastatin (MEVACOR) 40 MG tablet TAKE 2 TABLETS EVERY DAY 09/23/14   Venia Carbon, MD  metFORMIN (GLUCOPHAGE) 1000 MG tablet Take 500 mg by mouth 2 (two) times daily with a meal.    Historical Provider, MD  methadone (DOLOPHINE) 10 MG tablet Take 2 tablets four times a day. 05/31/15   Venia Carbon, MD  metolazone (ZAROXOLYN) 2.5 MG tablet Take 1 tablet (2.5 mg total) by mouth as needed. 06/01/15   Jolaine Artist, MD   metoprolol tartrate (LOPRESSOR) 25 MG tablet Take 0.5 tablets (12.5 mg total) by mouth 2 (two) times daily. 12/07/14   Shanker Kristeen Mans, MD  mirtazapine (REMERON) 30 MG tablet TAKE 1/2 TO 1 TABLET AT BEDTIME 09/14/14   Venia Carbon, MD  pantoprazole (PROTONIX) 40 MG tablet TAKE 1 TABLET TWICE DAILY 12/22/14   Venia Carbon, MD  Potassium Chloride ER 20 MEQ TBCR Take 20 meq with Metolazone. 06/02/15   Jolaine Artist, MD  spironolactone (ALDACTONE) 50 MG tablet Take 1-2 tablets (50-100 mg total) by mouth daily. Patient taking differently: Take 50 mg by mouth daily.  05/31/15   Venia Carbon, MD  Tiotropium Bromide-Olodaterol (STIOLTO RESPIMAT) 2.5-2.5 MCG/ACT AERS Inhale 2 puffs into the lungs daily. 02/17/15   Juanito Doom, MD  torsemide (DEMADEX) 20 MG tablet Take 3 tablets (60 mg total) by mouth 2 (two) times daily. 05/12/15   Venia Carbon, MD    Physical Exam: Filed Vitals:   06/02/15 1710 06/02/15 1845 06/02/15 1900 06/02/15 1915  BP: 180/78 128/60 124/65 154/79  Pulse: 86 73 71 72  Temp: 99 F (37.2 C)     TempSrc: Oral     Resp: _0 SpO2: 95% 96% 97% 96%     General:  Moderately built and nourished.  Eyes: Anicteric. No pallor.  ENT: No discharge from the ears eyes nose and mouth.  Neck: No mass felt.  Cardiovascular: S1 and S2 heard.  Respiratory: No rhonchi or crepitations.  Abdomen: Soft nontender bowel sounds present.  Skin: No rash.  Musculoskeletal: No edema.  Psychiatric: Appears normal.  Neurologic: Alert awake oriented to time place and person. Moves all extremities.  Labs on Admission:  Basic Metabolic Panel:  Recent Labs Lab 05/31/15 1034 06/02/15 1209 06/02/15 1715 06/02/15 1717  NA 125* 119*  --  119*  K 4.9 5.4*  --  5.1  CL 85* 77*  --  76*  CO2 33* 36*  --  28  GLUCOSE 173* 123*  --  219*  BUN 31* 37*  --  36*  CREATININE 1.74* 1.89*  --  1.86*  CALCIUM 9.6 9.9  --  9.3  MG  --   --  1.9  --   PHOS 3.6  --   4.4  --    Liver Function Tests:  Recent Labs Lab 05/31/15 1034  ALBUMIN 4.4   No results for input(s): LIPASE, AMYLASE in the last 168 hours. No results for input(s): AMMONIA in the last 168 hours. CBC:  Recent Labs Lab 06/02/15 1717  WBC 10.3  NEUTROABS 6.7  HGB 14.1  HCT 40.5  MCV 86.7  PLT 241   Cardiac Enzymes: No results for input(s): CKTOTAL, CKMB, CKMBINDEX, TROPONINI in the last 168 hours.  BNP (last 3 results)  Recent Labs  12/04/14 1103 01/17/15 1000 06/02/15 1717  BNP 959.4* 179.4* 21.5    ProBNP (last 3 results) No results for input(s): PROBNP in the last 8760 hours.  CBG: No results for input(s): GLUCAP in the last 168 hours.  Radiological Exams on Admission: Dg Chest 2 View  06/02/2015   CLINICAL DATA:  Exertional shortness of breath, abdominal fluid retention  EXAM: CHEST  2 VIEW  COMPARISON:  12/03/2014  FINDINGS: Stable mild cardiac enlargement. Mild to moderate vascular congestion with moderate diffuse interstitial prominence. Bibasilar atelectasis. No pleural effusion.  IMPRESSION: Findings again suggestive of congestive heart failure with mild to moderate pulmonary edema   Electronically Signed   By: Skipper Cliche M.D.   On: 06/02/2015 17:33    EKG: Independently reviewed. Normal sinus rhythm.  Assessment/Plan Principal Problem:   Hyponatremia Active Problems:   Chronic kidney disease, stage III (moderate)   Chronic diastolic heart failure   Pulmonary HTN   Chronic respiratory failure with hypoxia   COPD (chronic obstructive pulmonary disease)   1. Severe hyponatremia - most likely secondary to fluid overload further compounded by spironolactone. Check urine sodium urine osmolality and TSH and cortisol levels. At this time I'm going to place patient on fluid restriction and continue IV Lasix 60 mg every 12 hourly. We will hold off spironolactone for now. 2. Chronic diastolic CHF with last EF measured 60-65% with pulmonary hypertension  and cor pulmonale - presently patient is on IV Lasix 60 mg every 12 hourly. Positive for intake output and metabolic panel and daily weights. 3. Diabetes mellitus type 2 - or metformin for now and patient has been placed on sliding scale coverage. 4. COPD - presently not wheezing. 5. Chronic kidney disease stage III - creatinine appears to be at baseline and follow metabolic panel.  I have reviewed patient's old charts labs and personally reviewed patient's chest x-ray and EKG.   DVT Prophylaxis Lovenox.  Code Status: Full code.   Family Communication: Discussed with patient's mother.  Disposition Plan: Admit to inpatient.    KAKRAKANDY,ARSHAD N. [facility] Pager [phone].  If 7PM-7AM, please contact night-coverage [email or web address] Password [facility] 06/02/2015, 8:23 PM

## 2015-06-02 NOTE — ED Notes (Signed)
Attempted to call report. RN to call back. 

## 2015-06-02 NOTE — ED Notes (Signed)
Patient transported to Ultrasound 

## 2015-06-02 NOTE — Telephone Encounter (Signed)
Pt called in....she was seen in the clinic yesterday and was advised to take metolazone and potassium today and tomorrow. She took 2.64m of metolazone and 20 meq of potassium this morning and started experiencing muscle aches. [facility].  Notified Dr.Letvaks office and entered orders into epic. They told me to have pt come in for lab work.  Called pt and she is on her way for labs.

## 2015-06-02 NOTE — Telephone Encounter (Signed)
Advised pt to go [facility] per Dr.McLean

## 2015-06-02 NOTE — ED Notes (Signed)
MD at bedside. 

## 2015-06-03 LAB — BASIC METABOLIC PANEL
ANION GAP: 14 (ref 5–15)
ANION GAP: 13 (ref 5–15)
Anion gap: 14 (ref 5–15)
BUN: 36 mg/dL — ABNORMAL HIGH (ref 6–20)
BUN: 36 mg/dL — ABNORMAL HIGH (ref 6–20)
BUN: 35 mg/dL — AB (ref 6–20)
CALCIUM: 9.1 mg/dL (ref 8.9–10.3)
CALCIUM: 9.2 mg/dL (ref 8.9–10.3)
CHLORIDE: 73 mmol/L — AB (ref 101–111)
CHLORIDE: 71 mmol/L — AB (ref 101–111)
CO2: 32 mmol/L (ref 22–32)
CO2: 33 mmol/L — ABNORMAL HIGH (ref 22–32)
CO2: 34 mmol/L — AB (ref 22–32)
CREATININE: 1.98 mg/dL — AB (ref 0.44–1.00)
CREATININE: 1.95 mg/dL — AB (ref 0.44–1.00)
Calcium: 9.2 mg/dL (ref 8.9–10.3)
Chloride: 72 mmol/L — ABNORMAL LOW (ref 101–111)
Creatinine, Ser: 1.96 mg/dL — ABNORMAL HIGH (ref 0.44–1.00)
GFR calc Af Amer: 30 mL/min — ABNORMAL LOW (ref >60)
GFR calc Af Amer: 31 mL/min — ABNORMAL LOW (ref >60)
GFR calc non Af Amer: 27 mL/min — ABNORMAL LOW (ref >60)
GFR, EST AFRICAN AMERICAN: 31 mL/min — AB (ref >60)
GFR, EST NON AFRICAN AMERICAN: 26 mL/min — AB (ref >60)
GFR, EST NON AFRICAN AMERICAN: 26 mL/min — AB (ref >60)
GLUCOSE: 133 mg/dL — AB (ref 65–99)
GLUCOSE: 136 mg/dL — AB (ref 65–99)
Glucose, Bld: 204 mg/dL — ABNORMAL HIGH (ref 65–99)
POTASSIUM: 4.3 mmol/L (ref 3.5–5.1)
POTASSIUM: 4.5 mmol/L (ref 3.5–5.1)
Potassium: 4.2 mmol/L (ref 3.5–5.1)
SODIUM: 119 mmol/L — AB (ref 135–145)
SODIUM: 119 mmol/L — AB (ref 135–145)
Sodium: 118 mmol/L — CL (ref 135–145)

## 2015-06-03 LAB — CBC WITH DIFFERENTIAL/PLATELET
Basophils Absolute: 0 10*3/uL (ref 0.0–0.1)
Basophils Relative: 0 % (ref 0–1)
EOS ABS: 0.3 10*3/uL (ref 0.0–0.7)
EOS PCT: 3 % (ref 0–5)
HCT: 40.3 % (ref 36.0–46.0)
HEMOGLOBIN: 14.3 g/dL (ref 12.0–15.0)
LYMPHS ABS: 2.4 10*3/uL (ref 0.7–4.0)
Lymphocytes Relative: 26 % (ref 12–46)
MCH: 29.9 pg (ref 26.0–34.0)
MCHC: 35.5 g/dL (ref 30.0–36.0)
MCV: 84.3 fL (ref 78.0–100.0)
MONOS PCT: 7 % (ref 3–12)
Monocytes Absolute: 0.7 10*3/uL (ref 0.1–1.0)
Neutro Abs: 5.9 10*3/uL (ref 1.7–7.7)
Neutrophils Relative %: 64 % (ref 43–77)
PLATELETS: 255 10*3/uL (ref 150–400)
RBC: 4.78 MIL/uL (ref 3.87–5.11)
RDW: 12.5 % (ref 11.5–15.5)
WBC: 9.3 10*3/uL (ref 4.0–10.5)

## 2015-06-03 LAB — GLUCOSE, CAPILLARY
Glucose-Capillary: 153 mg/dL — ABNORMAL HIGH (ref 65–99)
Glucose-Capillary: 117 mg/dL — ABNORMAL HIGH (ref 65–99)
Glucose-Capillary: 158 mg/dL — ABNORMAL HIGH (ref 65–99)
Glucose-Capillary: 181 mg/dL — ABNORMAL HIGH (ref 65–99)

## 2015-06-03 LAB — MRSA PCR SCREENING: MRSA BY PCR: NEGATIVE

## 2015-06-03 MED ORDER — SODIUM CHLORIDE 0.9 % IV SOLN
INTRAVENOUS | Status: DC
  Administered 2015-06-03 (×2): via INTRAVENOUS

## 2015-06-03 MED ORDER — LEVALBUTEROL HCL 0.63 MG/3ML IN NEBU: 0.6300 mg | INHALATION_SOLUTION | RESPIRATORY_TRACT | Status: DC | PRN

## 2015-06-03 MED ORDER — METOLAZONE 2.5 MG PO TABS
2.5000 mg | ORAL_TABLET | Freq: Every day | ORAL | Status: DC
  Administered 2015-06-03 – 2015-06-06 (×4): 2.5 mg via ORAL
  Filled 2015-06-03 (×4): qty 1

## 2015-06-03 MED ORDER — FUROSEMIDE 10 MG/ML IJ SOLN
60.0000 mg | Freq: Four times a day (QID) | INTRAMUSCULAR | Status: DC
  Administered 2015-06-03 – 2015-06-05 (×9): 60 mg via INTRAVENOUS
  Filled 2015-06-03 (×10): qty 6

## 2015-06-03 MED ORDER — ENOXAPARIN SODIUM 30 MG/0.3ML ~~LOC~~ SOLN
30.0000 mg | SUBCUTANEOUS | Status: DC
  Administered 2015-06-03 – 2015-06-08 (×6): 30 mg via SUBCUTANEOUS
  Filled 2015-06-03 (×7): qty 0.3

## 2015-06-03 NOTE — ED Provider Notes (Signed)
CSN: [id number]     Arrival date & time 06/02/15  1707 History   First MD Initiated Contact with Patient 06/02/15 1814     Chief Complaint  Patient presents with  . Abdominal Pain     (Consider location/radiation/quality/duration/timing/severity/associated sxs/prior Treatment) Patient is a 59 y.o. female presenting with abdominal pain. The history is provided by the patient.  Abdominal Pain Associated symptoms: shortness of breath   Associated symptoms: no chest pain, no cough, no fatigue and no vomiting   patient has had swelling in her abdomen. She saw her primary care doctor and her cardiologist and had some adjustment of her fluid pills. States her weight had gone up. Had lab work drawn in the office and showed a sodium of 119. Has had some mild shortness of breath. Has some swelling or legs and states it is not that much different. She is on oxygen due to her chronic heart failure and pulmonary hypertension. Sent in for the hyponatremia. No decrease in her urination but states it did not increase much with the "super fluid pill" that she took.  Past Medical History  Diagnosis Date  . Depression   . GERD (gastroesophageal reflux disease)   . Hyperlipidemia   . Hypertension   . PVD (peripheral vascular disease)   . CAD (coronary artery disease)   . Familial hematuria   . NIDDM (non-insulin dependent diabetes mellitus)     with neuropathy  . IBS (irritable bowel syndrome)   . Esophageal stricture   . Obesity, unspecified   . Nephrolithiasis   . Allergy   . Arthritis   . Pulmonary hypertension    Past Surgical History  Procedure Laterality Date  . Abdominal hysterectomy    . Cesarean section    . Carpal tunnel release    . Right heart catheterization N/A 12/05/2014    Procedure: RIGHT HEART CATH;  Surgeon: Sinclair Grooms, MD;  Location: [facility] CATH LAB;  Service: Cardiovascular;  Laterality: N/A;   Family History  Problem Relation Age of Onset  . Diabetes Mother   .  Emphysema Paternal Grandfather   . Allergies Sister   . Allergies Father   . Cancer Mother     ovarian,melanoma  . Cancer Father     lung  . Cancer Maternal Grandmother     uterine   History  Substance Use Topics  . Smoking status: Former Smoker -- 1.50 packs/day for 20 years    Types: Cigarettes    Quit date: 01/04/2009  . Smokeless tobacco: Never Used  . Alcohol Use: No     Comment: heavy in the past   OB History    No data available     Review of Systems  Constitutional: Negative for appetite change and fatigue.  Respiratory: Positive for shortness of breath. Negative for cough.   Cardiovascular: Negative for chest pain.  Gastrointestinal: Positive for abdominal pain and abdominal distention. Negative for vomiting.  Genitourinary: Negative for decreased urine volume.  Musculoskeletal: Negative for back pain.       Patient has been having muscle cramps.  Neurological: Negative for headaches.  Hematological: Negative for adenopathy.      Allergies  Review of patient's allergies indicates no known allergies.  Home Medications   Prior to Admission medications   Medication Sig Start Date End Date Taking? Authorizing Provider  albuterol (PROVENTIL HFA;VENTOLIN HFA) 108 (90 BASE) MCG/ACT inhaler Inhale 2 puffs into the lungs every 6 (six) hours as needed for wheezing or shortness  of breath. 01/09/15  Yes Juanito Doom, MD  ALPRAZolam Duanne Moron) 0.25 MG tablet TAKE ONE (1) TABLET EACH DAY 03/31/15  Yes Venia Carbon, MD  aspirin 325 MG tablet Take 325 mg by mouth daily.     Yes Historical Provider, MD  gabapentin (NEURONTIN) 300 MG capsule TAKE 1 CAPSULE FOUR TIMES DAILY 11/08/14  Yes Venia Carbon, MD  loratadine (CLARITIN) 10 MG tablet Take 10 mg by mouth 2 (two) times daily.    Yes Historical Provider, MD  lovastatin (MEVACOR) 40 MG tablet TAKE 2 TABLETS EVERY DAY Patient taking differently: TAKE 80 MG BY MOUTH DAILY AT BEDTIME 09/23/14  Yes Venia Carbon, MD   metFORMIN (GLUCOPHAGE) 1000 MG tablet Take 500 mg by mouth 2 (two) times daily with a meal.   Yes Historical Provider, MD  methadone (DOLOPHINE) 10 MG tablet Take 2 tablets four times a day. Patient taking differently: Take 20 mg by mouth 4 (four) times daily. Take 2 tablets four times a day. 05/31/15  Yes Venia Carbon, MD  metolazone (ZAROXOLYN) 2.5 MG tablet Take 1 tablet (2.5 mg total) by mouth as needed. Patient taking differently: Take 2.5 mg by mouth as needed (EDEMA).  06/01/15  Yes Jolaine Artist, MD  metoprolol tartrate (LOPRESSOR) 25 MG tablet Take 0.5 tablets (12.5 mg total) by mouth 2 (two) times daily. 12/07/14  Yes Shanker Kristeen Mans, MD  mirtazapine (REMERON) 30 MG tablet TAKE 1/2 TO 1 TABLET AT BEDTIME Patient taking differently: TAKE 30 MG BY MOUTH DAILY  AT BEDTIME 09/14/14  Yes Venia Carbon, MD  pantoprazole (PROTONIX) 40 MG tablet TAKE 1 TABLET TWICE DAILY 12/22/14  Yes Venia Carbon, MD  Potassium Chloride ER 20 MEQ TBCR Take 20 meq with Metolazone. Patient taking differently: Take 20 mEq by mouth as needed (take with metolazone as a booster).  06/02/15  Yes Jolaine Artist, MD  spironolactone (ALDACTONE) 50 MG tablet Take 1-2 tablets (50-100 mg total) by mouth daily. Patient taking differently: Take 50 mg by mouth 2 (two) times daily.  05/31/15  Yes Venia Carbon, MD  Tiotropium Bromide-Olodaterol (STIOLTO RESPIMAT) 2.5-2.5 MCG/ACT AERS Inhale 2 puffs into the lungs daily. 02/17/15  Yes Juanito Doom, MD  torsemide (DEMADEX) 20 MG tablet Take 3 tablets (60 mg total) by mouth 2 (two) times daily. 05/12/15  Yes Venia Carbon, MD   BP 130/68 mmHg  Pulse 74  Temp(Src) 97.6 F (36.4 C) (Oral)  Resp 16  Ht _0  (1.575 m)  Wt 158 lb 15.2 oz (72.1 kg)  BMI 29.07 kg/m2  SpO2 96% Physical Exam  Constitutional: She appears well-developed.  Neck: No JVD present.  Cardiovascular: Normal rate.   Pulmonary/Chest: Effort normal.  He scattered rales at the bases   Musculoskeletal: She exhibits edema.  Neurological: She is alert.  Skin: Skin is warm.    ED Course  Procedures (including critical care time) Labs Review Labs Reviewed  BASIC METABOLIC PANEL - Abnormal; Notable for the following:    Sodium 119 (*)    Chloride 76 (*)    Glucose, Bld 219 (*)    BUN 36 (*)    Creatinine, Ser 1.86 (*)    GFR calc non Af Amer 28 (*)    GFR calc Af Amer 33 (*)    All other components within normal limits  OSMOLALITY, URINE - Abnormal; Notable for the following:    Osmolality, Ur 230 (*)    All other components  within normal limits  BASIC METABOLIC PANEL - Abnormal; Notable for the following:    Sodium 120 (*)    Chloride 76 (*)    Glucose, Bld 135 (*)    BUN 35 (*)    Creatinine, Ser 1.93 (*)    GFR calc non Af Amer 27 (*)    GFR calc Af Amer 31 (*)    All other components within normal limits  GLUCOSE, CAPILLARY - Abnormal; Notable for the following:    Glucose-Capillary 147 (*)    All other components within normal limits  MRSA PCR SCREENING  CBC WITH DIFFERENTIAL/PLATELET  BRAIN NATRIURETIC PEPTIDE  MAGNESIUM  PHOSPHORUS  SODIUM, URINE, RANDOM  TSH  CORTISOL  CBC  BASIC METABOLIC PANEL  BASIC METABOLIC PANEL  BASIC METABOLIC PANEL  CBC WITH DIFFERENTIAL/PLATELET  I-STAT TROPOININ, ED    Imaging Review Dg Chest 2 View  06/02/2015   CLINICAL DATA:  Exertional shortness of breath, abdominal fluid retention  EXAM: CHEST  2 VIEW  COMPARISON:  12/03/2014  FINDINGS: Stable mild cardiac enlargement. Mild to moderate vascular congestion with moderate diffuse interstitial prominence. Bibasilar atelectasis. No pleural effusion.  IMPRESSION: Findings again suggestive of congestive heart failure with mild to moderate pulmonary edema   Electronically Signed   By: Skipper Cliche M.D.   On: 06/02/2015 17:33   US Abdomen Complete  06/02/2015   CLINICAL DATA:  Acute onset of generalized abdominal bloating and pain. Initial encounter.  EXAM:  ULTRASOUND ABDOMEN COMPLETE  COMPARISON:  Abdominal ultrasound performed 12/05/2014  FINDINGS: Gallbladder: No gallstones or wall thickening visualized. No sonographic Murphy sign noted.  Common bile duct: Diameter: 0.5 cm, within normal limits in caliber.  Liver: No focal lesion identified. Within normal limits in parenchymal echogenicity.  IVC: No abnormality visualized.  Pancreas: Visualized portion unremarkable.  Spleen: Size and appearance within normal limits.  Right Kidney: Length: 9.6 cm. Echogenicity within normal limits. No mass or hydronephrosis visualized.  Left Kidney: Length: 9.3 cm. Echogenicity within normal limits. No mass or hydronephrosis visualized.  Abdominal aorta: Not characterized due to overlying bowel gas.  Other findings: None.  IMPRESSION: Unremarkable abdominal ultrasound.   Electronically Signed   By: Garald Balding M.D.   On: 06/02/2015 21:39     EKG Interpretation   Date/Time:  Friday June 02 2015 17:16:07 EDT Ventricular Rate:  80 PR Interval:  146 QRS Duration: 82 QT Interval:  380 QTC Calculation: 438 R Axis:   48 Text Interpretation:  Normal sinus rhythm Possible Left atrial enlargement  Borderline ECG Confirmed by Alvino Chapel  MD, Ovid Curd [phone]) on 06/03/2015  12:31:39 AM      MDM   Final diagnoses:  Hyponatremia  Pulmonary hypertension  Chronic respiratory failure with hypoxia    Patient with hyponatremia and SOb. Has had adjustment of her fluid pills. Will admit.     Davonna Belling, MD 06/03/15 [phone]

## 2015-06-03 NOTE — Progress Notes (Signed)
CRITICAL VALUE ALERT  Critical value received:  Na 118  Date of notification:  06/03/15  Time of notification:  0300  Critical value read back: yes  Nurse who received alert:  Remo Lipps, RN  MD notified (1st page):  Donnal Debar, NP  Time of first page:  0300  Responding MD:  Donnal Debar, NP  Time MD responded:  [phone]

## 2015-06-03 NOTE — Progress Notes (Signed)
[place] TEAM 1 - Stepdown/ICU TEAM Progress Note  Lindsay Harmon:[id number] DOB: April 28, 1956 DOA: 06/02/2015 PCP: Viviana Simpler, MD  Admit HPI / Brief Narrative: 59 y.o. female with history of cor pulmonale, diastolic CHF, Severe COPD, diabetes mellitus and chronic kidney disease who was referred to the ER after patient's blood work showed that patient had sodium of 119. Patient was otherwise asymptomatic. Patient's sodium was reconfirmed in the ER to be 119. Patient denied excessive fluid intake. Patient was placed on metolazone 2 days prior for her CHF. Patient also was recently started on spironolactone. Patient takes torsemide. On exam patient has mild lower extremity edema. Chest x-ray shows features concerning for CHF.   HPI/Subjective: The patient states she is feeling better overall.  She is significantly less short of breath and states this is due to the fact that her abdomen is not quite as distended as it was previously.  She denies chest pain nausea vomiting or abdominal pain.  Assessment/Plan:  Severe hyponatremia Felt to be due to volume overload compounded by diuretic - no significant change in sodium since admission - continue to hold Aldactone - attempt free water diuresis with increasing dose of Lasix  Chronic diastolic congestive heart failure and RV failure/cor pulmonale with pulmonary hypertension  Followed in CHF clinic - status post right heart cath February 2016 - not felt to be a candidate for selective pulmonary vasodilators - weight 71 kg during CHF clinic visit 06/01/2015 > presently 72.2 kg - has been as low as 60 kg this year - appears to carry significant amount of her volume overload as ascites - continue IV diuretic and follow  DM 2 CBG reasonably controlled - no change in treatment plan today  COPD Well compensated presently without evidence of wheezing  CKD stage III Creatinine appears to be relatively stable - continue to follow with diuresis of  free water  Code Status: FULL Family Communication: Spoke with patient and her mother at the bedside Disposition Plan: SDU  Consultants: None  Procedures: None  Antibiotics: None  DVT prophylaxis: Lovenox  Objective: Blood pressure 115/55, pulse 68, temperature 97.8 F (36.6 C), temperature source Oral, resp. rate 14, height 5' 2" (1.575 m), weight 72.2 kg (159 lb 2.8 oz), SpO2 99 %.  Intake/Output Summary (Last 24 hours) at 06/03/15 1553 Last data filed at 06/03/15 1300  Gross per 24 hour  Intake   1235 ml  Output   3300 ml  Net  -2065 ml   Exam: General: No acute respiratory distress at rest in bed Lungs: Fine crackles throughout but most prominent in bilateral bases with no wheeze Cardiovascular: Regular rate without murmur gallop or rub normal S1 and S2 Abdomen: Nontender, protuberant, soft, bowel sounds positive, no rebound, no appreciable mass Extremities: No significant cyanosis, or clubbing;  trace edema bilateral lower extremities  Data Reviewed: Basic Metabolic Panel:  Recent Labs Lab 05/31/15 1034  06/02/15 1715 06/02/15 1717 06/02/15 2206 06/03/15 0120 06/03/15 0559 06/03/15 1115  NA 125*  < >  --  119* 120* 118* 119* 119*  K 4.9  < >  --  5.1 4.6 4.3 4.2 4.5  CL 85*  < >  --  76* 76* 72* 73* 71*  CO2 33*  < >  --  28 32 32 33* 34*  GLUCOSE 173*  < >  --  219* 135* 204* 133* 136*  BUN 31*  < >  --  36* 35* 36* 36* 35*  CREATININE 1.74*  < >  --  1.86* 1.93* 1.98* 1.96* 1.95*  CALCIUM 9.6  < >  --  9.3 9.4 9.1 9.2 9.2  MG  --   --  1.9  --   --   --   --   --   PHOS 3.6  --  4.4  --   --   --   --   --   < > = values in this interval not displayed.  CBC:  Recent Labs Lab 06/02/15 1717 06/02/15 2206 06/03/15 0559  WBC 10.3 9.2 9.3  NEUTROABS 6.7  --  5.9  HGB 14.1 13.8 14.3  HCT 40.5 38.8 40.3  MCV 86.7 84.7 84.3  PLT 241 244 255    Liver Function Tests:  Recent Labs Lab 05/31/15 1034  ALBUMIN 4.4   CBG:  Recent Labs Lab  06/02/15 2148 06/03/15 0758 06/03/15 1216  GLUCAP 147* 153* 117*    Recent Results (from the past 240 hour(s))  MRSA PCR Screening     Status: None   Collection Time: 06/02/15 10:31 PM  Result Value Ref Range Status   MRSA by PCR NEGATIVE NEGATIVE Final    Comment:        The GeneXpert MRSA Assay (FDA approved for NASAL specimens only), is one component of a comprehensive MRSA colonization surveillance program. It is not intended to diagnose MRSA infection nor to guide or monitor treatment for MRSA infections.      Studies:   Recent x-ray studies have been reviewed in detail by the Attending Physician  Scheduled Meds:  Scheduled Meds: . antiseptic oral rinse  7 mL Mouth Rinse BID  . aspirin  325 mg Oral Daily  . enoxaparin (LOVENOX) injection  30 mg Subcutaneous Q24H  . furosemide  60 mg Intravenous Q12H  . gabapentin  300 mg Oral Daily  . insulin aspart  0-9 Units Subcutaneous TID WC  . loratadine  10 mg Oral BID  . methadone  20 mg Oral QID  . metoprolol tartrate  12.5 mg Oral BID  . mirtazapine  15 mg Oral QHS  . pantoprazole  40 mg Oral BID  . pravastatin  80 mg Oral q1800  . sodium chloride  3 mL Intravenous Q12H  . Tiotropium Bromide-Olodaterol  2 puff Inhalation Daily    Time spent on care of this patient: 35 mins   Rhythm Wigfall T , MD   [facility] Office  [phone] Pager - Text Page per Shea Evans as per below:  On-Call/Text Page:      [email or web address]      password [email or web address]  If 7PM-7AM, please contact night-coverage [email or web address] Password [email or web address] 06/03/2015, 3:53 PM   LOS: 1 day

## 2015-06-04 LAB — GLUCOSE, CAPILLARY
GLUCOSE-CAPILLARY: 173 mg/dL — AB (ref 65–99)
Glucose-Capillary: 158 mg/dL — ABNORMAL HIGH (ref 65–99)

## 2015-06-04 LAB — CBC
HEMATOCRIT: 43.4 % (ref 36.0–46.0)
HEMOGLOBIN: 15.7 g/dL — AB (ref 12.0–15.0)
MCH: 30.8 pg (ref 26.0–34.0)
MCHC: 36.2 g/dL — AB (ref 30.0–36.0)
MCV: 85.3 fL (ref 78.0–100.0)
Platelets: 257 10*3/uL (ref 150–400)
RBC: 5.09 MIL/uL (ref 3.87–5.11)
RDW: 12.7 % (ref 11.5–15.5)
WBC: 15.3 10*3/uL — ABNORMAL HIGH (ref 4.0–10.5)

## 2015-06-04 LAB — BASIC METABOLIC PANEL
Anion gap: 16 — ABNORMAL HIGH (ref 5–15)
BUN: 53 mg/dL — AB (ref 6–20)
CO2: 32 mmol/L (ref 22–32)
CREATININE: 2.24 mg/dL — AB (ref 0.44–1.00)
Calcium: 9.1 mg/dL (ref 8.9–10.3)
Chloride: 73 mmol/L — ABNORMAL LOW (ref 101–111)
GFR calc Af Amer: 26 mL/min — ABNORMAL LOW (ref >60)
GFR calc non Af Amer: 22 mL/min — ABNORMAL LOW (ref >60)
Glucose, Bld: 188 mg/dL — ABNORMAL HIGH (ref 65–99)
POTASSIUM: 4 mmol/L (ref 3.5–5.1)
Sodium: 121 mmol/L — ABNORMAL LOW (ref 135–145)

## 2015-06-04 NOTE — Clinical Documentation Improvement (Signed)
Current documentation reflects history of chronic diastolic chf; recently started spironolactone and metolazone.  Currently on furosemide 71m IV q 6 hrs.  Please clarify if acuity of diastolic heart failure this admission is acute on chronic or chronic only and if acute, please document in your future progress notes and discharge summary.      Thank you, VMateo Flow RN [phone] Documentation Specialist

## 2015-06-04 NOTE — Progress Notes (Signed)
[place] TEAM 1 - Stepdown/ICU TEAM Progress Note  Lindsay Harmon QAS:[id number] DOB: 1956-02-04 DOA: 06/02/2015 PCP: Viviana Simpler, MD  Admit HPI / Brief Narrative: 59 y.o. female with history of cor pulmonale, diastolic CHF, Severe COPD, diabetes mellitus and chronic kidney disease who was referred to the ER after patient's blood work showed that patient had sodium of 119. Patient was otherwise asymptomatic. Patient's sodium was reconfirmed in the ER to be 119. Patient denied excessive fluid intake. Patient was placed on metolazone 2 days prior for her CHF. Patient also was recently started on spironolactone. Patient takes torsemide. On exam patient had mild lower extremity edema. Chest x-ray showed features concerning for CHF.   HPI/Subjective: The patient reports that she continues to feel better today.  She feels that her abdominal distention is slightly less pronounced again today.  She denies chest pain shortness of breath fevers chills nausea or vomiting.  Assessment/Plan:  Severe hyponatremia Felt to be due to volume overload compounded by diuretic - improving slowly - continue to hold Aldactone - cont high dose lasix   Acute exacerbation of Chronic diastolic congestive heart failure and RV failure/cor pulmonale with pulmonary hypertension  Followed in CHF clinic - status post right heart cath February 2016 - not felt to be a candidate for selective pulmonary vasodilators - weight 71 kg during CHF clinic visit 06/01/2015 > presently 71.1 kg - has been as low as 60 kg this year - appears to carry significant amount of her volume overload as ascites - continue IV diuretic and follow - consult CHF team in a.m.  DM 2 CBG reasonably controlled - no change in treatment plan today  COPD Well compensated presently without evidence of wheezing  CKD stage III Creatinine appears to be relatively stable - continue to follow with diuresis of free water  Code Status: FULL Family  Communication: Spoke with patient and her mother at the bedside Disposition Plan: SDU  Consultants: None  Procedures: None  Antibiotics: None  DVT prophylaxis: Lovenox  Objective: Blood pressure 92/64, pulse 71, temperature 98.1 F (36.7 C), temperature source Oral, resp. rate 10, height _0  (1.575 m), weight 71.1 kg (156 lb 12 oz), SpO2 94 %.  Intake/Output Summary (Last 24 hours) at 06/04/15 1620 Last data filed at 06/04/15 1353  Gross per 24 hour  Intake 1351.25 ml  Output   1850 ml  Net -498.75 ml   Exam: General: No acute respiratory distress Lungs: Fine crackles throughout but most prominent in bilateral bases with no wheeze - less pronounced than yesterday Cardiovascular: Regular rate without murmur gallop or rub normal S1 and S2 Abdomen: Nontender, modestly protuberant, soft, bowel sounds positive, no rebound, no appreciable mass Extremities: No significant cyanosis, or clubbing;  No edema bilateral lower extremities  Data Reviewed: Basic Metabolic Panel:  Recent Labs Lab 05/31/15 1034  06/02/15 1715  06/02/15 2206 06/03/15 0120 06/03/15 0559 06/03/15 1115 06/04/15 0316  NA 125*  < >  --   < > 120* 118* 119* 119* 121*  K 4.9  < >  --   < > 4.6 4.3 4.2 4.5 4.0  CL 85*  < >  --   < > 76* 72* 73* 71* 73*  CO2 33*  < >  --   < > 32 32 33* 34* 32  GLUCOSE 173*  < >  --   < > 135* 204* 133* 136* 188*  BUN 31*  < >  --   < > 35*  36* 36* 35* 53*  CREATININE 1.74*  < >  --   < > 1.93* 1.98* 1.96* 1.95* 2.24*  CALCIUM 9.6  < >  --   < > 9.4 9.1 9.2 9.2 9.1  MG  --   --  1.9  --   --   --   --   --   --   PHOS 3.6  --  4.4  --   --   --   --   --   --   < > = values in this interval not displayed.  CBC:  Recent Labs Lab 06/02/15 1717 06/02/15 2206 06/03/15 0559 06/04/15 0316  WBC 10.3 9.2 9.3 15.3*  NEUTROABS 6.7  --  5.9  --   HGB 14.1 13.8 14.3 15.7*  HCT 40.5 38.8 40.3 43.4  MCV 86.7 84.7 84.3 85.3  PLT 241 244 255 257    Liver Function  Tests:  Recent Labs Lab 05/31/15 1034  ALBUMIN 4.4   CBG:  Recent Labs Lab 06/03/15 1216 06/03/15 1610 06/03/15 2253 06/04/15 0930 06/04/15 1128  GLUCAP 117* 158* 181* 173* 158*    Recent Results (from the past 240 hour(s))  MRSA PCR Screening     Status: None   Collection Time: 06/02/15 10:31 PM  Result Value Ref Range Status   MRSA by PCR NEGATIVE NEGATIVE Final    Comment:        The GeneXpert MRSA Assay (FDA approved for NASAL specimens only), is one component of a comprehensive MRSA colonization surveillance program. It is not intended to diagnose MRSA infection nor to guide or monitor treatment for MRSA infections.      Studies:   Recent x-ray studies have been reviewed in detail by the Attending Physician  Scheduled Meds:  Scheduled Meds: . antiseptic oral rinse  7 mL Mouth Rinse BID  . aspirin  325 mg Oral Daily  . enoxaparin (LOVENOX) injection  30 mg Subcutaneous Q24H  . furosemide  60 mg Intravenous Q6H  . gabapentin  300 mg Oral Daily  . insulin aspart  0-9 Units Subcutaneous TID WC  . loratadine  10 mg Oral BID  . methadone  20 mg Oral QID  . metolazone  2.5 mg Oral Daily  . metoprolol tartrate  12.5 mg Oral BID  . mirtazapine  15 mg Oral QHS  . pantoprazole  40 mg Oral BID  . pravastatin  80 mg Oral q1800  . sodium chloride  3 mL Intravenous Q12H  . Tiotropium Bromide-Olodaterol  2 puff Inhalation Daily    Time spent on care of this patient: 35 mins   Lourene Hoston T , MD   [facility] Office  [phone] Pager - Text Page per Shea Evans as per below:  On-Call/Text Page:      [email or web address]      password [email or web address]  If 7PM-7AM, please contact night-coverage [email or web address] Password [email or web address] 06/04/2015, 4:20 PM   LOS: 2 days

## 2015-06-04 NOTE — Progress Notes (Signed)
Utilization Review Completed.

## 2015-06-05 ENCOUNTER — Encounter: Payer: Self-pay | Admitting: *Deleted

## 2015-06-05 DIAGNOSIS — I5033 Acute on chronic diastolic (congestive) heart failure: Secondary | ICD-10-CM

## 2015-06-05 DIAGNOSIS — E871 Hypo-osmolality and hyponatremia: Principal | ICD-10-CM

## 2015-06-05 LAB — BASIC METABOLIC PANEL
Anion gap: 17 — ABNORMAL HIGH (ref 5–15)
BUN: 70 mg/dL — ABNORMAL HIGH (ref 6–20)
CHLORIDE: 73 mmol/L — AB (ref 101–111)
CO2: 34 mmol/L — ABNORMAL HIGH (ref 22–32)
CREATININE: 2.83 mg/dL — AB (ref 0.44–1.00)
Calcium: 9.5 mg/dL (ref 8.9–10.3)
GFR, EST AFRICAN AMERICAN: 20 mL/min — AB (ref >60)
GFR, EST NON AFRICAN AMERICAN: 17 mL/min — AB (ref >60)
Glucose, Bld: 143 mg/dL — ABNORMAL HIGH (ref 65–99)
Potassium: 4 mmol/L (ref 3.5–5.1)
Sodium: 124 mmol/L — ABNORMAL LOW (ref 135–145)

## 2015-06-05 LAB — GLUCOSE, CAPILLARY
GLUCOSE-CAPILLARY: 125 mg/dL — AB (ref 65–99)
GLUCOSE-CAPILLARY: 198 mg/dL — AB (ref 65–99)
Glucose-Capillary: 151 mg/dL — ABNORMAL HIGH (ref 65–99)
Glucose-Capillary: 165 mg/dL — ABNORMAL HIGH (ref 65–99)
Glucose-Capillary: 188 mg/dL — ABNORMAL HIGH (ref 65–99)
Glucose-Capillary: 260 mg/dL — ABNORMAL HIGH (ref 65–99)

## 2015-06-05 MED ORDER — FUROSEMIDE 10 MG/ML IJ SOLN
60.0000 mg | Freq: Two times a day (BID) | INTRAMUSCULAR | Status: DC
  Administered 2015-06-06: 60 mg via INTRAVENOUS
  Filled 2015-06-05 (×2): qty 6

## 2015-06-05 NOTE — Care Management Note (Signed)
Case Management Note  Patient Details  Name: Lindsay Harmon MRN: [id number] Date of Birth: November 15, 1955  Subjective/Objective:     Pt live next door to relatives who can provide 24/7 assistance, will need home health RN and PT.  Relative have rolling walker that pt can use as long as needed.  Provided list of [facility] health agencies, referral made per pt choice.                         Expected Discharge Plan:  [place]  In-House Referral:     Discharge planning Services  CM Consult  Post Acute Care Choice:    Choice offered to:     DME Arranged:    DME Agency:     HH Arranged:  RN, PT [place] Agency:  [place]  Status of Service:  In process, will continue to follow  Medicare Important Message Given:  Yes-second notification given Date Medicare IM Given:    Medicare IM give by:    Date Additional Medicare IM Given:    Additional Medicare Important Message give by:     If discussed at [place] of Stay Meetings, dates discussed:    Additional Comments:  Girard Cooter, RN 06/05/2015, 3:39 PM

## 2015-06-05 NOTE — Consult Note (Addendum)
Advanced Heart Failure Team Consult Note  Referring Physician: Thereasa Solo Primary Physician: Primary Cardiologist:    Reason for Consultation: HF/hyponatremia  HPI:    Lindsay Harmon is a 59 year old with history of HTN, DM, IBS, depression, GERD, severe COPD (FEV1 0.87L) and pulmonary hypertension with RV failure/cor pulmonale.  We saw her in HF Clinic on 8/4 and felt to be volume overloaded so she was given metolazone for 2 days. She was also recently started on spironolactone by Dr. Silvio Pate. She was referred to the ER on 8/5 after routine blood work showed a serum sodium of 119. At the time of admission her only complaint was abdominal bloating and weight gain. She denied worsening edema, dyspnea or neurologic changes.   On exam she had LE edema and CXR was suggestive of CHF. She was admitted and diuresed with IV lasix. She is down 9 pounds and feels much better. [name] has been held. Sodium now 124. She says she drinks some water but does not feel it is excessive.    Review of Systems: [y] = yes, _0  = no   General: Weight gain Blue.Reese ]; Weight loss _1 ; Anorexia _2 ; Fatigue [ y]; Fever _3 ; Chills _4 ; Weakness _5   Cardiac: Chest pain/pressure _6 ; Resting SOB _7 ; Exertional SOB Blue.Reese ]; Orthopnea _8 ; Pedal Edema Blue.Reese ]; Palpitations _9 ; Syncope _10 ; Presyncope _11 ; Paroxysmal nocturnal dyspnea_12   Pulmonary: Cough _13 ; Wheezing_14 ; Hemoptysis_15 ; Sputum _16 ; Snoring _17   GI: Vomiting_18 ; Dysphagia_19 ; Melena_20 ; Hematochezia _21 ; Heartburn_22 ; Abdominal pain _23 ; Constipation _24 ; Diarrhea _25 ; BRBPR _26   GU: Hematuria_27 ; Dysuria _28 ; Nocturia_29   Vascular: Pain in legs with walking _30 ; Pain in feet with lying flat _31 ; Non-healing sores _32 ; Stroke _33 ; TIA _34 ; Slurred speech _35 ;  Neuro: Headaches_36 ; Vertigo_37 ; Seizures_38 ; Paresthesias_39 ;Blurred vision _40 ; Diplopia _41 ; Vision changes _42   Ortho/Skin: Arthritis Blue.Reese ]; Joint pain Blue.Reese ]; Muscle pain _43 ; Joint swelling _44 ; Back Pain [  ]; Rash _45   Psych: Depression_46 ; Anxiety_47   Heme: Bleeding problems _48 ; Clotting disorders _49 ; Anemia _50   Endocrine: Diabetes [ y]; Thyroid dysfunction_51   Home Medications Prior to Admission medications   Medication Sig Start Date End Date Taking? Authorizing Provider  albuterol (PROVENTIL HFA;VENTOLIN HFA) 108 (90 BASE) MCG/ACT inhaler Inhale 2 puffs into the lungs every 6 (six) hours as needed for wheezing or shortness of breath. 01/09/15  Yes Juanito Doom, MD  ALPRAZolam Duanne Moron) 0.25 MG tablet TAKE ONE (1) TABLET EACH DAY 03/31/15  Yes Venia Carbon, MD  aspirin 325 MG tablet Take 325 mg by mouth daily.     Yes Historical Provider, MD  gabapentin (NEURONTIN) 300 MG capsule TAKE 1 CAPSULE FOUR TIMES DAILY 11/08/14  Yes Venia Carbon, MD  loratadine (CLARITIN) 10 MG tablet Take 10 mg by mouth 2 (two) times daily.    Yes Historical Provider, MD  lovastatin (MEVACOR) 40 MG tablet TAKE 2 TABLETS EVERY DAY Patient taking differently: TAKE 80 MG BY MOUTH DAILY AT BEDTIME 09/23/14  Yes Venia Carbon, MD  metFORMIN (GLUCOPHAGE) 1000 MG tablet Take 500 mg by mouth 2 (two) times daily with a meal.   Yes Historical Provider, MD  methadone (DOLOPHINE) 10 MG tablet Take 2 tablets four times  a day. Patient taking differently: Take 20 mg by mouth 4 (four) times daily. Take 2 tablets four times a day. 05/31/15  Yes Venia Carbon, MD  metolazone (ZAROXOLYN) 2.5 MG tablet Take 1 tablet (2.5 mg total) by mouth as needed. Patient taking differently: Take 2.5 mg by mouth as needed (EDEMA).  06/01/15  Yes Jolaine Artist, MD  metoprolol tartrate (LOPRESSOR) 25 MG tablet Take 0.5 tablets (12.5 mg total) by mouth 2 (two) times daily. 12/07/14  Yes Shanker Kristeen Mans, MD  mirtazapine (REMERON) 30 MG tablet TAKE 1/2 TO 1 TABLET AT BEDTIME Patient taking differently: TAKE 30 MG BY MOUTH DAILY  AT BEDTIME 09/14/14  Yes Venia Carbon, MD  pantoprazole (PROTONIX) 40 MG tablet TAKE 1 TABLET TWICE DAILY  12/22/14  Yes Venia Carbon, MD  Potassium Chloride ER 20 MEQ TBCR Take 20 meq with Metolazone. Patient taking differently: Take 20 mEq by mouth as needed (take with metolazone as a booster).  06/02/15  Yes Jolaine Artist, MD  spironolactone (ALDACTONE) 50 MG tablet Take 1-2 tablets (50-100 mg total) by mouth daily. Patient taking differently: Take 50 mg by mouth 2 (two) times daily.  05/31/15  Yes Venia Carbon, MD  Tiotropium Bromide-Olodaterol (STIOLTO RESPIMAT) 2.5-2.5 MCG/ACT AERS Inhale 2 puffs into the lungs daily. 02/17/15  Yes Juanito Doom, MD  torsemide (DEMADEX) 20 MG tablet Take 3 tablets (60 mg total) by mouth 2 (two) times daily. 05/12/15  Yes Venia Carbon, MD    Past Medical History: Past Medical History  Diagnosis Date  . Depression   . GERD (gastroesophageal reflux disease)   . Hyperlipidemia   . Hypertension   . PVD (peripheral vascular disease)   . CAD (coronary artery disease)   . Familial hematuria   . NIDDM (non-insulin dependent diabetes mellitus)     with neuropathy  . IBS (irritable bowel syndrome)   . Esophageal stricture   . Obesity, unspecified   . Nephrolithiasis   . Allergy   . Arthritis   . Pulmonary hypertension     Past Surgical History: Past Surgical History  Procedure Laterality Date  . Abdominal hysterectomy    . Cesarean section    . Carpal tunnel release    . Right heart catheterization N/A 12/05/2014    Procedure: RIGHT HEART CATH;  Surgeon: Sinclair Grooms, MD;  Location[facility] C[facility] LAB;  Service: Cardiovascular;  Laterality: N/A;    Family History: Family History  Problem Relation Age of Onset  . Diabetes Mother   . Emphysema Paternal Grandfather   . Allergies Sister   . Allergies Father   . Cancer Mother     ovarian,melanoma  . Cancer Father     lung  . Cancer Maternal Grandmother     uterine    Social History: History   Social History  . Marital Status: Married    Spouse Name: N/A  . Number of Children: 1   . Years of Education: N/A   Occupational History  . 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 Smoking status: Former Smoker -- 1.50 packs/day for 20 years    Types: Cigarettes    Quit date: 01/04/2009  . Smokeless tobacco: Never Used  . Alcohol Use: No     Comment: heavy in the past  . Drug Use: No  . Sexual Activity: No   Other Topics Concern  . None   Social History Narrative  No living will   Son [name] should make decisions for her if she is unable.    Would accept resuscitation attempts but no prolonged ventilation   Not sure about tube feeds but probably wouldn't want them if cognitively unaware    Allergies:  No Known Allergies  Objective:    Vital Signs:   Temp:  [97.5 F (36.4 C)-98.3 F (36.8 C)] 97.5 F (36.4 C) (08/08 1913) Pulse Rate:  [69-90] 87 (08/08 2128) Resp:  [9-18] 15 (08/08 1913) BP: (95-118)/(54-84) 103/62 mmHg (08/08 2128) SpO2:  [88 %-94 %] 91 % (08/08 1913) Weight:  [68.7 kg (151 lb 7.3 oz)] 68.7 kg (151 lb 7.3 oz) (08/08 0427) Last BM Date: 05/30/15  Weight change: Filed Weights   06/03/15 0500 06/04/15 0500 06/05/15 0427  Weight: 72.2 kg (159 lb 2.8 oz) 71.1 kg (156 lb 12 oz) 68.7 kg (151 lb 7.3 oz)    Intake/Output:   Intake/Output Summary (Last 24 hours) at 06/05/15 2331 Last data filed at 06/05/15 2159  Gross per 24 hour  Intake    523 ml  Output   1500 ml  Net   -977 ml     Physical Exam: General: Sitting in chair. No resp difficulty. Son present HEENT: normal Neck: supple. JVD 6. Carotids 2+ bilat; no bruits. No lymphadenopathy or thryomegaly appreciated. Cor: PMI nondisplaced. regular. No rubs, gallops. Prominent P2. 1/6 HSM LLSB.  Lungs: decreased throughout Abdomen: soft, nontender, + distended. No hepatosplenomegaly. No bruits or masses. Good bowel sounds. Extremities: no cyanosis, + clubbing, 1+ edema to knees bilaterally Neuro: alert & oriented x 3, cranial nerves grossly  intact. moves all 4 extremities w/o difficulty. Affect pleasant  Telemetry: NSR  Labs: Basic Metabolic Panel:  Recent Labs Lab 05/31/15 1034  06/02/15 1715  06/03/15 0120 06/03/15 0559 06/03/15 1115 06/04/15 0316 06/04/15 2333  NA 125*  < >  --   < > 118* 119* 119* 121* 124*  K 4.9  < >  --   < > 4.3 4.2 4.5 4.0 4.0  CL 85*  < >  --   < > 72* 73* 71* 73* 73*  CO2 33*  < >  --   < > 32 33* 34* 32 34*  GLUCOSE 173*  < >  --   < > 204* 133* 136* 188* 143*  BUN 31*  < >  --   < > 36* 36* 35* 53* 70*  CREATININE 1.74*  < >  --   < > 1.98* 1.96* 1.95* 2.24* 2.83*  CALCIUM 9.6  < >  --   < > 9.1 9.2 9.2 9.1 9.5  MG  --   --  1.9  --   --   --   --   --   --   PHOS 3.6  --  4.4  --   --   --   --   --   --   < > = values in this interval not displayed.  Liver Function Tests:  Recent Labs Lab 05/31/15 1034  ALBUMIN 4.4   No results for input(s): LIPASE, AMYLASE in the last 168 hours. No results for input(s): AMMONIA in the last 168 hours.  CBC:  Recent Labs Lab 06/02/15 1717 06/02/15 2206 06/03/15 0559 06/04/15 0316  WBC 10.3 9.2 9.3 15.3*  NEUTROABS 6.7  --  5.9  --   HGB 14.1 13.8 14.3 15.7*  HCT 40.5 38.8 40.3 43.4  MCV 86.7 84.7 84.3 85.3  PLT 241 244 255 257    Cardiac Enzymes: No results for input(s): CKTOTAL, CKMB, CKMBINDEX, TROPONINI in the last 168 hours.  BNP: BNP (last 3 results)  Recent Labs  12/04/14 1103 01/17/15 1000 06/02/15 1717  BNP 959.4* 179.4* 21.5    ProBNP (last 3 results) No results for input(s): PROBNP in the last 8760 hours.   CBG:  Recent Labs Lab 06/04/15 2331 06/05/15 0843 06/05/15 1250 06/05/15 1654 06/05/15 2137  GLUCAP 165* 188* 125* 260* 198*    Coagulation Studies: No results for input(s): LABPROT, INR in the last 72 hours.  Other results: EKG: NSR 80 + uwaves No ST-T wave abnormalities.    Imaging:  No results found.   Medications:     Current Medications: . antiseptic oral rinse  7 mL Mouth  Rinse BID  . aspirin  325 mg Oral Daily  . enoxaparin (LOVENOX) injection  30 mg Subcutaneous Q24H  . [START ON 06/06/2015] furosemide  60 mg Intravenous Q12H  . gabapentin  300 mg Oral Daily  . insulin aspart  0-9 Units Subcutaneous TID WC  . loratadine  10 mg Oral BID  . methadone  20 mg Oral QID  . metolazone  2.5 mg Oral Daily  . metoprolol tartrate  12.5 mg Oral BID  . mirtazapine  15 mg Oral QHS  . pantoprazole  40 mg Oral BID  . pravastatin  80 mg Oral q1800  . sodium chloride  3 mL Intravenous Q12H  . Tiotropium Bromide-Olodaterol  2 puff Inhalation Daily     Infusions:      Assessment:   1. Acute on chronic diastolic HF 2. PAH with cor pulmonale 3. Severe COPD 4. Chronic respiratory failure 5. Hyponatremia  Plan/Discussion:    She is much improved after an 8 pounds diuresis and now appears euvolemic. Suspect her hyponatremia related to diuretic use and possible excessive free water intake. Given severe COPD lung CA with SIADH also a consideration but I have reviewed CXR personally and don't see any evidence of a mass.   I suspect she may be ready for discharge in the am. Can resume home demadex to 40bid and watch renal function and serum sodium closely. Can use metolazone 2.51m prn for weight gain.   If hyponatremia persists would consider chest CT to further evaluate.  We will see back in HF Clinic.   Bensimhon, Daniel,MD 11:38 PM Advanced Heart Failure Team Pager [phone](M-F; 7a - 4p)  Please contact [facility] for night-coverage after hours (4p -7a ) and weekends on [email or web address]

## 2015-06-05 NOTE — Evaluation (Signed)
Physical Therapy Evaluation Patient Details Name: Lindsay Harmon MRN: [id number] DOB: [birth year] Today's Date: 06/05/2015   History of Present Illness  Pt is a 59 y/o female with a PMH of cor pulmonale, diastolic CHF, COPD, diabetes mellitus and chronic kidney disease was referred to the ER after patient's blood work showed that patient had sodium of 119. Patient is otherwise asymptomatic. Patient's sodium was reconfirmed in the ER to be 119. Patient denies having had excessive fluid intake. Patient was recently placed on metolazone last 2 days for her CHF. Patient also was recently started on spironolactone. Patient takes torsemide. Despite which patient states she thinks she is gaining weight and has abdominal distention. On exam patient has mild lower extremity edema. Chest x-ray shows features concerning for CHF. On exam patient is not in distress. Patient has been admitted for severe hyponatremia.   Clinical Impression  Pt admitted with above diagnosis. Pt currently with functional limitations due to the deficits listed below (see PT Problem List). At the time of PT eval pt was able to perform transfers and ambulation with min guard assist for occasional unsteadiness and general safety. Pt has family that can reportedly be home with her 24 hours initially. Pt will benefit from skilled PT to increase their independence and safety with mobility to allow discharge to the venue listed below.       Follow Up Recommendations Home health PT;Supervision/Assistance - 24 hour (At least the first few days at home 24 hours)    Equipment Recommendations  None recommended by PT    Recommendations for Other Services       Precautions / Restrictions Precautions Precautions: Fall Restrictions Weight Bearing Restrictions: No      Mobility  Bed Mobility Overal bed mobility: Needs Assistance Bed Mobility: Supine to Sit     Supine to sit: Supervision     General bed mobility comments: Supervision  for safety.   Transfers Overall transfer level: Needs assistance Equipment used: Rolling walker (2 wheeled) Transfers: Sit to/from Stand Sit to Stand: Min guard         General transfer comment: Hands-on assist for balance and support. Pt slightly unsteady at first stand however recovered quickly.   Ambulation/Gait Ambulation/Gait assistance: Min guard Ambulation Distance (Feet): 100 Feet Assistive device: Rolling walker (2 wheeled) Gait Pattern/deviations: Step-through pattern;Decreased stride length;Trunk flexed Gait velocity: Decreased Gait velocity interpretation: Below normal speed for age/gender General Gait Details: Pt was able to ambulate in halls well with RW. Guarded and slow grossly. No LOB noted.   Stairs            Wheelchair Mobility    Modified Rankin (Stroke Patients Only)       Balance Overall balance assessment: Needs assistance Sitting-balance support: Feet supported;No upper extremity supported Sitting balance-Leahy Scale: Fair     Standing balance support: No upper extremity supported Standing balance-Leahy Scale: Fair Standing balance comment: Static standing                             Pertinent Vitals/Pain Pain Assessment: No/denies pain    Home Living Family/patient expects to be discharged to:: Private residence Living Arrangements: Alone Available Help at Discharge: Family;Available 24 hours/day (short term) Type of Home: Mobile home Home Access: Stairs to enter;Ramped entrance Entrance Stairs-Rails: Right;Left Entrance Stairs-Number of Steps: 4 Home Layout: One level Home Equipment: Walker - 2 wheels      Prior Function Level of Independence: Independent  Comments: Drives, grocery shops with a motorized buggy     Hand Dominance        Extremity/Trunk Assessment   Upper Extremity Assessment: Defer to OT evaluation           Lower Extremity Assessment: Generalized weakness       Cervical / Trunk Assessment: Normal  Communication   Communication: No difficulties  Cognition Arousal/Alertness: Awake/alert Behavior During Therapy: WFL for tasks assessed/performed Overall Cognitive Status: Within Functional Limits for tasks assessed                      General Comments      Exercises        Assessment/Plan    PT Assessment Patient needs continued PT services  PT Diagnosis Difficulty walking;Generalized weakness   PT Problem List Decreased strength;Decreased range of motion;Decreased activity tolerance;Decreased balance;Decreased mobility;Decreased knowledge of use of DME;Decreased safety awareness;Decreased knowledge of precautions;Cardiopulmonary status limiting activity  PT Treatment Interventions DME instruction;Gait training;Stair training;Functional mobility training;Therapeutic activities;Therapeutic exercise;Neuromuscular re-education;Patient/family education   PT Goals (Current goals can be found in the Care Plan section) Acute Rehab PT Goals Patient Stated Goal: Return home; feel better PT Goal Formulation: With patient/family Time For Goal Achievement: 06/12/15 Potential to Achieve Goals: Good    Frequency Min 3X/week   Barriers to discharge   Pt lives alone but has family next door    Co-evaluation               End of Session Equipment Utilized During Treatment: Gait belt;Oxygen Activity Tolerance: Other (comment) (Nausea at end of session) Patient left: in chair;with call bell/phone within reach;with family/visitor present Nurse Communication: Mobility status         Time: 4765-4650 PT Time Calculation (min) (ACUTE ONLY): 28 min   Charges:   PT Evaluation $Initial PT Evaluation Tier I: 1 Procedure PT Treatments $Gait Training: 8-22 mins   PT G Codes:        Rolinda Roan Jun 16, 2015, 4:32 PM   Rolinda Roan, PT, DPT Acute Rehabilitation Services Pager: [phone]

## 2015-06-05 NOTE — Progress Notes (Signed)
[facility] TEAM 1 - Stepdown/ICU TEAM Progress Note  Lindsay Harmon ZOX:[id number] DOB: April 25, 1956 DOA: 06/02/2015 PCP: Viviana Simpler, MD  Admit HPI / Brief Narrative: 59 y.o. female with history of cor pulmonale, diastolic CHF, Severe COPD, diabetes mellitus and chronic kidney disease who was referred to the ER after patient's blood work showed that patient had sodium of 119. Patient was otherwise asymptomatic. Patient's sodium was reconfirmed in the ER to be 119. Patient denied excessive fluid intake. Patient was placed on metolazone 2 days prior for her CHF. Patient also was recently started on spironolactone. Patient takes torsemide. On exam patient had mild lower extremity edema. Chest x-ray showed features concerning for CHF.   HPI/Subjective: The patient is resting comfortably and in good spirits.  She denies chest pain shortness of breath fevers chills nausea or vomiting.  Assessment/Plan:  Severe hyponatremia Felt to be due to volume overload/CHF compounded by aldactone - improving slowly - continue to hold Aldactone - cont high dose lasix but decrease dose given worsening renal function  Acute exacerbation of Chronic diastolic congestive heart failure and RV failure/cor pulmonale with pulmonary hypertension  Followed in CHF clinic - status post right heart cath February 2016 - not felt to be a candidate for selective pulmonary vasodilators - weight 71 kg during CHF clinic visit 06/01/2015 > presently 68.7 kg - has been as low as 60 kg this year - appears to carry significant amount of her volume overload as ascites - continue IV diuretic but decrease dose given climbing creatinine - consulted CHF team   DM 2 CBG reasonably controlled - no change in treatment plan today  COPD Well compensated presently without evidence of wheezing  CKD stage III Creatinine climbing with aggressive diuresis - lower Lasix dose and follow creatinine  Code Status: FULL Family Communication: Spoke  with patient and her mother at the bedside Disposition Plan: SDU  Consultants: CHF Team   Procedures: None  Antibiotics: None  DVT prophylaxis: Lovenox  Objective: Blood pressure 95/56, pulse 69, temperature 97.8 F (36.6 C), temperature source Oral, resp. rate 16, height _0  (1.575 m), weight 68.7 kg (151 lb 7.3 oz), SpO2 91 %.  Intake/Output Summary (Last 24 hours) at 06/05/15 1417 Last data filed at 06/05/15 1321  Gross per 24 hour  Intake    243 ml  Output   1600 ml  Net  -1357 ml   Exam: General: No acute respiratory distress - alert and pleasant  Lungs: Clear to auscultation throughout all fields with no wheeze Cardiovascular: Regular rate without murmur gallop or rub  Abdomen: Nontender, modestly protuberant, soft, bowel sounds positive, no rebound, no appreciable mass Extremities: No significant cyanosis, or clubbing;  no edema bilateral lower extremities  Data Reviewed: Basic Metabolic Panel:  Recent Labs Lab 05/31/15 1034  06/02/15 1715  06/03/15 0120 06/03/15 0559 06/03/15 1115 06/04/15 0316 06/04/15 2333  NA 125*  < >  --   < > 118* 119* 119* 121* 124*  K 4.9  < >  --   < > 4.3 4.2 4.5 4.0 4.0  CL 85*  < >  --   < > 72* 73* 71* 73* 73*  CO2 33*  < >  --   < > 32 33* 34* 32 34*  GLUCOSE 173*  < >  --   < > 204* 133* 136* 188* 143*  BUN 31*  < >  --   < > 36* 36* 35* 53* 70*  CREATININE 1.74*  < >  --   < >  1.98* 1.96* 1.95* 2.24* 2.83*  CALCIUM 9.6  < >  --   < > 9.1 9.2 9.2 9.1 9.5  MG  --   --  1.9  --   --   --   --   --   --   PHOS 3.6  --  4.4  --   --   --   --   --   --   < > = values in this interval not displayed.  CBC:  Recent Labs Lab 06/02/15 1717 06/02/15 2206 06/03/15 0559 06/04/15 0316  WBC 10.3 9.2 9.3 15.3*  NEUTROABS 6.7  --  5.9  --   HGB 14.1 13.8 14.3 15.7*  HCT 40.5 38.8 40.3 43.4  MCV 86.7 84.7 84.3 85.3  PLT 241 244 255 257    Liver Function Tests:  Recent Labs Lab 05/31/15 1034  ALBUMIN 4.4    CBG:  Recent Labs Lab 06/04/15 1128 06/04/15 1726 06/04/15 2331 06/05/15 0843 06/05/15 1250  GLUCAP 158* 151* 165* 188* 125*    Recent Results (from the past 240 hour(s))  MRSA PCR Screening     Status: None   Collection Time: 06/02/15 10:31 PM  Result Value Ref Range Status   MRSA by PCR NEGATIVE NEGATIVE Final    Comment:        The GeneXpert MRSA Assay (FDA approved for NASAL specimens only), is one component of a comprehensive MRSA colonization surveillance program. It is not intended to diagnose MRSA infection nor to guide or monitor treatment for MRSA infections.      Studies:   Recent x-ray studies have been reviewed in detail by the Attending Physician  Scheduled Meds:  Scheduled Meds: . antiseptic oral rinse  7 mL Mouth Rinse BID  . aspirin  325 mg Oral Daily  . enoxaparin (LOVENOX) injection  30 mg Subcutaneous Q24H  . furosemide  60 mg Intravenous Q6H  . gabapentin  300 mg Oral Daily  . insulin aspart  0-9 Units Subcutaneous TID WC  . loratadine  10 mg Oral BID  . methadone  20 mg Oral QID  . metolazone  2.5 mg Oral Daily  . metoprolol tartrate  12.5 mg Oral BID  . mirtazapine  15 mg Oral QHS  . pantoprazole  40 mg Oral BID  . pravastatin  80 mg Oral q1800  . sodium chloride  3 mL Intravenous Q12H  . Tiotropium Bromide-Olodaterol  2 puff Inhalation Daily    Time spent on care of this patient: 35 mins   MCCLUNG,JEFFREY T , MD   [facility] Office  [phone] Pager - Text Page per Shea Evans as per below:  On-Call/Text Page:      [email or web address]      password [email or web address]  If 7PM-7AM, please contact night-coverage [email or web address] Password [email or web address] 06/05/2015, 2:17 PM   LOS: 3 days

## 2015-06-05 NOTE — Progress Notes (Signed)
   Patient seen and examined. Full consult note pending. Volume status looks good after diuresis. Sodium improving. Continue current regimen. Possibly home in am. Stressed need for fluid restriction with her.   Bensimhon, Daniel,MD 7:48 PM

## 2015-06-05 NOTE — Care Management Important Message (Signed)
Important Message  Patient Details  Name: Lindsay Harmon MRN: [id number] Date of Birth: [birth year]   Medicare Important Message Given:  Yes-second notification given    Pricilla Handler 06/05/2015, 1:21 PM

## 2015-06-06 DIAGNOSIS — J9611 Chronic respiratory failure with hypoxia: Secondary | ICD-10-CM

## 2015-06-06 DIAGNOSIS — N179 Acute kidney failure, unspecified: Secondary | ICD-10-CM

## 2015-06-06 DIAGNOSIS — N183 Chronic kidney disease, stage 3 (moderate): Secondary | ICD-10-CM

## 2015-06-06 DIAGNOSIS — R1900 Intra-abdominal and pelvic swelling, mass and lump, unspecified site: Secondary | ICD-10-CM | POA: Diagnosis present

## 2015-06-06 LAB — CBC
HEMATOCRIT: 43.6 % (ref 36.0–46.0)
Hemoglobin: 15.4 g/dL — ABNORMAL HIGH (ref 12.0–15.0)
MCH: 30.4 pg (ref 26.0–34.0)
MCHC: 35.3 g/dL (ref 30.0–36.0)
MCV: 86 fL (ref 78.0–100.0)
Platelets: 267 10*3/uL (ref 150–400)
RBC: 5.07 MIL/uL (ref 3.87–5.11)
RDW: 12.9 % (ref 11.5–15.5)
WBC: 12.9 10*3/uL — ABNORMAL HIGH (ref 4.0–10.5)

## 2015-06-06 LAB — BASIC METABOLIC PANEL
Anion gap: 19 — ABNORMAL HIGH (ref 5–15)
BUN: 92 mg/dL — ABNORMAL HIGH (ref 6–20)
CHLORIDE: 71 mmol/L — AB (ref 101–111)
CO2: 33 mmol/L — AB (ref 22–32)
Calcium: 9 mg/dL (ref 8.9–10.3)
Creatinine, Ser: 3.17 mg/dL — ABNORMAL HIGH (ref 0.44–1.00)
GFR calc Af Amer: 17 mL/min — ABNORMAL LOW (ref >60)
GFR calc non Af Amer: 15 mL/min — ABNORMAL LOW (ref >60)
Glucose, Bld: 157 mg/dL — ABNORMAL HIGH (ref 65–99)
POTASSIUM: 3.6 mmol/L (ref 3.5–5.1)
Sodium: 123 mmol/L — ABNORMAL LOW (ref 135–145)

## 2015-06-06 LAB — GLUCOSE, CAPILLARY
GLUCOSE-CAPILLARY: 200 mg/dL — AB (ref 65–99)
GLUCOSE-CAPILLARY: 170 mg/dL — AB (ref 65–99)
Glucose-Capillary: 210 mg/dL — ABNORMAL HIGH (ref 65–99)
Glucose-Capillary: 149 mg/dL — ABNORMAL HIGH (ref 65–99)

## 2015-06-06 MED ORDER — MIRTAZAPINE 7.5 MG PO TABS
7.5000 mg | ORAL_TABLET | Freq: Every day | ORAL | Status: DC
  Administered 2015-06-06 – 2015-06-08 (×3): 7.5 mg via ORAL
  Filled 2015-06-06 (×5): qty 1

## 2015-06-06 MED ORDER — FUROSEMIDE 10 MG/ML IJ SOLN
30.0000 mg | Freq: Two times a day (BID) | INTRAMUSCULAR | Status: DC
  Administered 2015-06-06: 30 mg via INTRAVENOUS

## 2015-06-06 NOTE — Progress Notes (Signed)
[facility] TEAM 1 - Stepdown/ICU TEAM Progress Note  KYAN GIANNONE HKV:[id number] DOB: [birth year] DOA: 06/02/2015 PCP: Viviana Simpler, MD  Admit HPI / Brief Narrative: 59 y.o. WF PMHx Cor Pulmonale, Diastolic CHF, Severe COPD, Diabetes Mellitus uncontrolled, and Cronic Cambria.  Referred to the ER after patient's blood work showed that patient had sodium of 119. Patient was otherwise asymptomatic. Patient's sodium was reconfirmed in the ER to be 119. Patient denied excessive fluid intake. Patient was placed on metolazone 2 days prior for her CHF. Patient also was recently started on spironolactone. Patient takes torsemide. On exam patient had mild lower extremity edema. Chest x-ray showed features concerning for CHF.    HPI/Subjective: 8/9 A/O 4, negative abdominal pain, negative CVA tenderness, negative N/V, negative SOB, negative CP. Patient states has not been making a lot of urine since admission.    Assessment/Plan: Severe hyponatremia Felt to be due to volume overload/CHF compounded by aldactone - improving slowly - continue to hold Aldactone - cont high dose lasix but decrease dose given worsening renal function -Patient also on Remeron which can cause hyponatremia will decrease to 7.5 mg QHS  Acute exacerbation of Chronic diastolic congestive heart failure and RV failure/cor pulmonale with pulmonary hypertension  Followed in CHF clinic - status post right heart cath February 2016 - not felt to be a candidate for selective pulmonary vasodilators - weight 71 kg during CHF clinic visit 06/01/2015 > presently 68.7 kg - has been as low as 60 kg this year - appears to carry significant amount of her volume overload as ascites - continue IV diuretic but decrease dose given climbing creatinine - consulted CHF team   DM type 2 uncontrolled -8/3 hemoglobin A1c= 7.1 -CBG reasonably controlled - no change in treatment plan today  COPD Well compensated presently without evidence of  wheezing  Acute on CKD stage III -Creatinine continues to trend up with aggressive diuresis  - lower Lasix dose 30 mg BID; if Cr continues to climb after that we will need to consult nephrology -Maintaining strict I&O   Code Status: FULL Family Communication: no family present at time of exam Disposition Plan: Resolution acute renal failure    Consultants: Dr.Daniel R Bensimhon (cardiology)  Procedure/Significant Events:    Culture   Antibiotics:   DVT prophylaxis:    Devices    LINES / TUBES:      Continuous Infusions:   Objective: VITAL SIGNS: Temp: 97.8 F (36.6 C) (08/09 1914) Temp Source: Oral (08/09 1914) BP: 98/51 mmHg (08/09 1914) Pulse Rate: 74 (08/09 1914) SPO2; FIO2:   Intake/Output Summary (Last 24 hours) at 06/06/15 2226 Last data filed at 06/06/15 2100  Gross per 24 hour  Intake    123 ml  Output   1500 ml  Net  -1377 ml     Exam: General:  A/O 4, NAD, No acute respiratory distress Lungs: Clear to auscultation bilaterally without wheezes or crackles Cardiovascular: Regular rate and rhythm without murmur gallop or rub normal S1 and S2 Abdomen:negative abdominal pain, negative dysphagia, nondistended, positive soft, bowel sounds, no rebound, no ascites, no appreciable mass Extremities: No significant cyanosis, clubbing, or edema bilateral lower extremities Neurologic:  Cranial nerves II through XII intact, tongue/uvula midline, all extremities muscle strength 5/5, sensation intact throughout, negative dysarthria, negative expressive aphasia, negative receptive aphasia    Data Reviewed: Basic Metabolic Panel:  Recent Labs Lab 05/31/15 1034  06/02/15 1715  06/03/15 0559 06/03/15 1115 06/04/15 0316 06/04/15 2333 06/06/15 0016  NA 125*  < >  --   < > 119* 119* 121* 124* 123*  K 4.9  < >  --   < > 4.2 4.5 4.0 4.0 3.6  CL 85*  < >  --   < > 73* 71* 73* 73* 71*  CO2 33*  < >  --   < > 33* 34* 32 34* 33*  GLUCOSE 173*  < >   --   < > 133* 136* 188* 143* 157*  BUN 31*  < >  --   < > 36* 35* 53* 70* 92*  CREATININE 1.74*  < >  --   < > 1.96* 1.95* 2.24* 2.83* 3.17*  CALCIUM 9.6  < >  --   < > 9.2 9.2 9.1 9.5 9.0  MG  --   --  1.9  --   --   --   --   --   --   PHOS 3.6  --  4.4  --   --   --   --   --   --   < > = values in this interval not displayed. Liver Function Tests:  Recent Labs Lab 05/31/15 1034  ALBUMIN 4.4   No results for input(s): LIPASE, AMYLASE in the last 168 hours. No results for input(s): AMMONIA in the last 168 hours. CBC:  Recent Labs Lab 06/02/15 1717 06/02/15 2206 06/03/15 0559 06/04/15 0316 06/06/15 0016  WBC 10.3 9.2 9.3 15.3* 12.9*  NEUTROABS 6.7  --  5.9  --   --   HGB 14.1 13.8 14.3 15.7* 15.4*  HCT 40.5 38.8 40.3 43.4 43.6  MCV 86.7 84.7 84.3 85.3 86.0  PLT 241 244 255 257 267   Cardiac Enzymes: No results for input(s): CKTOTAL, CKMB, CKMBINDEX, TROPONINI in the last 168 hours. BNP (last 3 results)  Recent Labs  12/04/14 1103 01/17/15 1000 06/02/15 1717  BNP 959.4* 179.4* 21.5    ProBNP (last 3 results) No results for input(s): PROBNP in the last 8760 hours.  CBG:  Recent Labs Lab 06/05/15 2137 06/06/15 0736 06/06/15 1223 06/06/15 1829 06/06/15 2045  GLUCAP 198* 210* 200* 149* 170*    Recent Results (from the past 240 hour(s))  MRSA PCR Screening     Status: None   Collection Time: 06/02/15 10:31 PM  Result Value Ref Range Status   MRSA by PCR NEGATIVE NEGATIVE Final    Comment:        The GeneXpert MRSA Assay (FDA approved for NASAL specimens only), is one component of a comprehensive MRSA colonization surveillance program. It is not intended to diagnose MRSA infection nor to guide or monitor treatment for MRSA infections.      Studies:  Recent x-ray studies have been reviewed in detail by the Attending Physician  Scheduled Meds:  Scheduled Meds: . antiseptic oral rinse  7 mL Mouth Rinse BID  . aspirin  325 mg Oral Daily  .  enoxaparin (LOVENOX) injection  30 mg Subcutaneous Q24H  . gabapentin  300 mg Oral Daily  . insulin aspart  0-9 Units Subcutaneous TID WC  . loratadine  10 mg Oral BID  . methadone  20 mg Oral QID  . metoprolol tartrate  12.5 mg Oral BID  . mirtazapine  7.5 mg Oral QHS  . pantoprazole  40 mg Oral BID  . pravastatin  80 mg Oral q1800  . sodium chloride  3 mL Intravenous Q12H    Time spent on care of this  patient: 40 mins   Monette Omara, Geraldo Docker , MD  [facility] Office  [phone] Pager [phone]  On-Call/Text Page:      [email or web address]      password [email or web address]  If 7PM-7AM, please contact night-coverage [email or web address] Password [email or web address] 06/06/2015, 10:26 PM   LOS: 4 days   Care during the described time interval was provided by me .  I have reviewed this patient's available data, including medical history, events of note, physical examination, and all test results as part of my evaluation. I have personally reviewed and interpreted all radiology studies.   Dia Crawford, MD [phone] Pager

## 2015-06-06 NOTE — Progress Notes (Signed)
Advanced Heart Failure Rounding Note   Subjective:    Breathing ok. Weight stable. Creatinine continues to get worse.    Objective:   Weight Range:  Vital Signs:   Temp:  [97.3 F (36.3 C)-98.1 F (36.7 C)] 97.6 F (36.4 C) (08/09 1600) Pulse Rate:  [68-89] 69 (08/09 1600) Resp:  [6-15] 9 (08/09 1600) BP: (85-127)/(50-90) 127/67 mmHg (08/09 1600) SpO2:  [89 %-96 %] 95 % (08/09 1600) Weight:  [69.3 kg (152 lb 12.5 oz)] 69.3 kg (152 lb 12.5 oz) (08/09 0500) Last BM Date: 05/30/15  Weight change: Filed Weights   06/04/15 0500 06/05/15 0427 06/06/15 0500  Weight: 71.1 kg (156 lb 12 oz) 68.7 kg (151 lb 7.3 oz) 69.3 kg (152 lb 12.5 oz)    Intake/Output:   Intake/Output Summary (Last 24 hours) at 06/06/15 1851 Last data filed at 06/06/15 1610  Gross per 24 hour  Intake    446 ml  Output   1400 ml  Net   -954 ml      Physical Exam: General: Sitting in chair. No resp difficulty. Son present HEENT: normal Neck: supple. JVD 6-8. Carotids 2+ bilat; no bruits. No lymphadenopathy or thryomegaly appreciated. Cor: PMI nondisplaced. regular. No rubs, gallops. Prominent P2. 1/6 HSM LLSB.  Lungs: decreased throughout Abdomen: soft, nontender, + distended. No hepatosplenomegaly. No bruits or masses. Good bowel sounds. Extremities: no cyanosis, + clubbing, 1+ edema to knees bilaterally Neuro: alert & oriented x 3, cranial nerves grossly intact. moves all 4 extremities w/o difficulty. Affect pleasant  Telemetry: NSR  Labs: Basic Metabolic Panel:  Recent Labs Lab 05/31/15 1034  06/02/15 1715  06/03/15 0559 06/03/15 1115 06/04/15 0316 06/04/15 2333 06/06/15 0016  NA 125*  < >  --   < > 119* 119* 121* 124* 123*  K 4.9  < >  --   < > 4.2 4.5 4.0 4.0 3.6  CL 85*  < >  --   < > 73* 71* 73* 73* 71*  CO2 33*  < >  --   < > 33* 34* 32 34* 33*  GLUCOSE 173*  < >  --   < > 133* 136* 188* 143* 157*  BUN 31*  < >  --   < > 36* 35* 53* 70* 92*  CREATININE 1.74*  < >  --   < >  1.96* 1.95* 2.24* 2.83* 3.17*  CALCIUM 9.6  < >  --   < > 9.2 9.2 9.1 9.5 9.0  MG  --   --  1.9  --   --   --   --   --   --   PHOS 3.6  --  4.4  --   --   --   --   --   --   < > = values in this interval not displayed.  Liver Function Tests:  Recent Labs Lab 05/31/15 1034  ALBUMIN 4.4   No results for input(s): LIPASE, AMYLASE in the last 168 hours. No results for input(s): AMMONIA in the last 168 hours.  CBC:  Recent Labs Lab 06/02/15 1717 06/02/15 2206 06/03/15 0559 06/04/15 0316 06/06/15 0016  WBC 10.3 9.2 9.3 15.3* 12.9*  NEUTROABS 6.7  --  5.9  --   --   HGB 14.1 13.8 14.3 15.7* 15.4*  HCT 40.5 38.8 40.3 43.4 43.6  MCV 86.7 84.7 84.3 85.3 86.0  PLT 241 244 255 257 267    Cardiac Enzymes: No results for input(s): CKTOTAL,  CKMB, CKMBINDEX, TROPONINI in the last 168 hours.  BNP: BNP (last 3 results)  Recent Labs  12/04/14 1103 01/17/15 1000 06/02/15 1717  BNP 959.4* 179.4* 21.5    ProBNP (last 3 results) No results for input(s): PROBNP in the last 8760 hours.    Other results:  Imaging:  No results found.   Medications:     Scheduled Medications: . antiseptic oral rinse  7 mL Mouth Rinse BID  . aspirin  325 mg Oral Daily  . enoxaparin (LOVENOX) injection  30 mg Subcutaneous Q24H  . furosemide  30 mg Intravenous Q12H  . gabapentin  300 mg Oral Daily  . insulin aspart  0-9 Units Subcutaneous TID WC  . loratadine  10 mg Oral BID  . methadone  20 mg Oral QID  . metolazone  2.5 mg Oral Daily  . metoprolol tartrate  12.5 mg Oral BID  . mirtazapine  7.5 mg Oral QHS  . pantoprazole  40 mg Oral BID  . pravastatin  80 mg Oral q1800  . sodium chloride  3 mL Intravenous Q12H     Infusions:     PRN Medications:  acetaminophen **OR** acetaminophen, ALPRAZolam, levalbuterol, ondansetron **OR** ondansetron (ZOFRAN) IV   Assessment:   1. Acute on chronic diastolic HF 2. PAH with cor pulmonale 3. Severe COPD 4. Chronic respiratory  failure 5. Hyponatremia 6. Acute renal failure  Plan/Discussion:    Still with some lower extremity edema but creatinine getting worse. Given PAH with cor pulmonale she is preload dependent. Stop all diuretics. Watch renal function closely.      Length of Stay: 4   Larrell Rapozo MD 06/06/2015, 6:51 PM  Advanced Heart Failure Team Pager [phone] (M-F; [place])  Please contact [facility] for night-coverage after hours (4p -7a ) and weekends on [email or web address]

## 2015-06-07 DIAGNOSIS — N189 Chronic kidney disease, unspecified: Secondary | ICD-10-CM

## 2015-06-07 DIAGNOSIS — I270 Primary pulmonary hypertension: Secondary | ICD-10-CM

## 2015-06-07 LAB — GLUCOSE, CAPILLARY
Glucose-Capillary: 214 mg/dL — ABNORMAL HIGH (ref 65–99)
Glucose-Capillary: 201 mg/dL — ABNORMAL HIGH (ref 65–99)
Glucose-Capillary: 110 mg/dL — ABNORMAL HIGH (ref 65–99)
Glucose-Capillary: 193 mg/dL — ABNORMAL HIGH (ref 65–99)

## 2015-06-07 LAB — BASIC METABOLIC PANEL
Anion gap: 19 — ABNORMAL HIGH (ref 5–15)
BUN: 104 mg/dL — AB (ref 6–20)
CHLORIDE: 71 mmol/L — AB (ref 101–111)
CO2: 34 mmol/L — ABNORMAL HIGH (ref 22–32)
Calcium: 9.3 mg/dL (ref 8.9–10.3)
Creatinine, Ser: 3.06 mg/dL — ABNORMAL HIGH (ref 0.44–1.00)
GFR calc non Af Amer: 15 mL/min — ABNORMAL LOW (ref >60)
GFR, EST AFRICAN AMERICAN: 18 mL/min — AB (ref >60)
GLUCOSE: 140 mg/dL — AB (ref 65–99)
Potassium: 2.7 mmol/L — CL (ref 3.5–5.1)
Sodium: 124 mmol/L — ABNORMAL LOW (ref 135–145)

## 2015-06-07 MED ORDER — POTASSIUM CHLORIDE 10 MEQ/100ML IV SOLN
10.0000 meq | INTRAVENOUS | Status: AC
  Administered 2015-06-07 (×3): 10 meq via INTRAVENOUS
  Filled 2015-06-07 (×4): qty 100

## 2015-06-07 NOTE — Progress Notes (Addendum)
Advanced Heart Failure Rounding Note   Subjective:    Breathing ok. Weight up a pound. Creatinine starting to improve. K low and has been repleted. Sodium remains low.    Objective:   Weight Range:  Vital Signs:   Temp:  [97.7 F (36.5 C)-98.1 F (36.7 C)] 97.7 F (36.5 C) (08/10 1600) Pulse Rate:  [71-86] 73 (08/10 1600) Resp:  [8-16] 12 (08/10 1600) BP: (94-129)/(47-69) 94/56 mmHg (08/10 1600) SpO2:  [92 %-96 %] 93 % (08/10 1600) Weight:  [69.5 kg (153 lb 3.5 oz)] 69.5 kg (153 lb 3.5 oz) (08/10 0500) Last BM Date: 05/30/15  Weight change: Filed Weights   06/05/15 0427 06/06/15 0500 06/07/15 0500  Weight: 68.7 kg (151 lb 7.3 oz) 69.3 kg (152 lb 12.5 oz) 69.5 kg (153 lb 3.5 oz)    Intake/Output:   Intake/Output Summary (Last 24 hours) at 06/07/15 1919 Last data filed at 06/07/15 1730  Gross per 24 hour  Intake    560 ml  Output   1901 ml  Net  -1341 ml      Physical Exam: General: Sitting in chair. No resp difficulty. Son present HEENT: normal Neck: supple. JVD 6-8. Carotids 2+ bilat; no bruits. No lymphadenopathy or thryomegaly appreciated. Cor: PMI nondisplaced. regular. No rubs, gallops. Prominent P2. 1/6 HSM LLSB.  Lungs: decreased throughout Abdomen: soft, nontender, + distended. No hepatosplenomegaly. No bruits or masses. Good bowel sounds. Extremities: no cyanosis, + clubbing, 1+ edema to knees bilaterally Neuro: alert & oriented x 3, cranial nerves grossly intact. moves all 4 extremities w/o difficulty. Affect pleasant  Telemetry: NSR  Labs: Basic Metabolic Panel:  Recent Labs Lab 06/02/15 1715  06/03/15 1115 06/04/15 0316 06/04/15 2333 06/06/15 0016 06/07/15 0242  NA  --   < > 119* 121* 124* 123* 124*  K  --   < > 4.5 4.0 4.0 3.6 2.7*  CL  --   < > 71* 73* 73* 71* 71*  CO2  --   < > 34* 32 34* 33* 34*  GLUCOSE  --   < > 136* 188* 143* 157* 140*  BUN  --   < > 35* 53* 70* 92* 104*  CREATININE  --   < > 1.95* 2.24* 2.83* 3.17* 3.06*   CALCIUM  --   < > 9.2 9.1 9.5 9.0 9.3  MG 1.9  --   --   --   --   --   --   PHOS 4.4  --   --   --   --   --   --   < > = values in this interval not displayed.  Liver Function Tests: No results for input(s): AST, ALT, ALKPHOS, BILITOT, PROT, ALBUMIN in the last 168 hours. No results for input(s): LIPASE, AMYLASE in the last 168 hours. No results for input(s): AMMONIA in the last 168 hours.  CBC:  Recent Labs Lab 06/02/15 1717 06/02/15 2206 06/03/15 0559 06/04/15 0316 06/06/15 0016  WBC 10.3 9.2 9.3 15.3* 12.9*  NEUTROABS 6.7  --  5.9  --   --   HGB 14.1 13.8 14.3 15.7* 15.4*  HCT 40.5 38.8 40.3 43.4 43.6  MCV 86.7 84.7 84.3 85.3 86.0  PLT 241 244 255 257 267    Cardiac Enzymes: No results for input(s): CKTOTAL, CKMB, CKMBINDEX, TROPONINI in the last 168 hours.  BNP: BNP (last 3 results)  Recent Labs  12/04/14 1103 01/17/15 1000 06/02/15 1717  BNP 959.4* 179.4* 21.5  ProBNP (last 3 results) No results for input(s): PROBNP in the last 8760 hours.    Other results:  Imaging: No results found.   Medications:     Scheduled Medications: . antiseptic oral rinse  7 mL Mouth Rinse BID  . aspirin  325 mg Oral Daily  . enoxaparin (LOVENOX) injection  30 mg Subcutaneous Q24H  . gabapentin  300 mg Oral Daily  . insulin aspart  0-9 Units Subcutaneous TID WC  . loratadine  10 mg Oral BID  . methadone  20 mg Oral QID  . metoprolol tartrate  12.5 mg Oral BID  . mirtazapine  7.5 mg Oral QHS  . pantoprazole  40 mg Oral BID  . pravastatin  80 mg Oral q1800  . sodium chloride  3 mL Intravenous Q12H    Infusions:    PRN Medications: acetaminophen **OR** acetaminophen, ALPRAZolam, levalbuterol, ondansetron **OR** ondansetron (ZOFRAN) IV   Assessment:   1. Acute on chronic diastolic HF 2. PAH with cor pulmonale 3. Severe COPD 4. Chronic respiratory failure 5. Hyponatremia 6. Acute renal failure  Plan/Discussion:    Diuretics on hold. Renal  function improving. Hopefully will be able to restart diuretics soon. Given PAH with cor pulmonale she is preload dependent so have to go slow.   K is low but has been repleted. Sodium remains low. May be worth getting non-contrast chest CT to exclude lung mass.    Length of Stay: 5   Bensimhon, Daniel MD 06/07/2015, 7:19 PM  Advanced Heart Failure Team Pager [phone] (M-F; [place])  Please contact [facility] for night-coverage after hours (4p -7a ) and weekends on [email or web address]

## 2015-06-07 NOTE — Progress Notes (Signed)
Physical Therapy Treatment Patient Details Name: Lindsay Harmon MRN: [id number] DOB: Mar 01, 1956 Today's Date: 06/07/2015    History of Present Illness Pt is a 59 y/o female with a PMH of cor pulmonale, diastolic CHF, COPD, diabetes mellitus and chronic kidney disease was referred to the ER after patient's blood work showed that patient had sodium of 119. Patient is otherwise asymptomatic. Patient's sodium was reconfirmed in the ER to be 119. Patient denies having had excessive fluid intake. Patient was recently placed on metolazone last 2 days for her CHF. Patient also was recently started on spironolactone. Patient takes torsemide. Despite which patient states she thinks she is gaining weight and has abdominal distention. On exam patient has mild lower extremity edema. Chest x-ray shows features concerning for CHF. On exam patient is not in distress. Patient has been admitted for severe hyponatremia.     PT Comments    Pt progressing well with mobility, ambulated 200' with RW and min-guard A. Would benefit from rollator for energy conservation with mobility at home. PT will continue to follow.   Follow Up Recommendations  Home health PT;Supervision/Assistance - 24 hour     Equipment Recommendations  Other (comment) (rollator)    Recommendations for Other Services       Precautions / Restrictions Precautions Precautions: Fall Restrictions Weight Bearing Restrictions: No    Mobility  Bed Mobility Overal bed mobility: Modified Independent Bed Mobility: Supine to Sit     Supine to sit: Modified independent (Device/Increase time)     General bed mobility comments: pt able to get self to EOB without assist or safety concerns  Transfers Overall transfer level: Needs assistance Equipment used: Rolling walker (2 wheeled) Transfers: Sit to/from Stand Sit to Stand: Supervision         General transfer comment: close supervision given but no assistance  needed  Ambulation/Gait Ambulation/Gait assistance: Min guard Ambulation Distance (Feet): 200 Feet Assistive device: Rolling walker (2 wheeled) Gait Pattern/deviations: Step-through pattern;Trunk flexed;Decreased stride length Gait velocity: Decreased Gait velocity interpretation: <1.8 ft/sec, indicative of risk for recurrent falls General Gait Details: worked on erect posture to promote better breathing. Pt ambulated on 3L O2 with O2 sats no lower than 93%. HR up to 86 bpm. Pt felt that RW did help with energy conservation. Worked on safe use of RW , especially with turns, pt may benefit from rollator at d/c   [employer]    Modified Rankin (Stroke Patients Only)       Balance Overall balance assessment: Needs assistance Sitting-balance support: Feet supported Sitting balance-Leahy Scale: Good     Standing balance support: No upper extremity supported Standing balance-Leahy Scale: Fair Standing balance comment: pt very cautious when standing without support, reaches for stable objects. Encouraged her in use of RW                    Cognition Arousal/Alertness: Awake/alert Behavior During Therapy: WFL for tasks assessed/performed Overall Cognitive Status: Within Functional Limits for tasks assessed                      Exercises General Exercises - Lower Extremity Ankle Circles/Pumps: AROM;Both;20 reps;Supine    General Comments General comments (skin integrity, edema, etc.): Pt pace and posture improved with increased distance. Ambulation seemed to help pt's fatigue level      Pertinent Vitals/Pain Pain Assessment: No/denies pain    Home Living  Prior Function            PT Goals (current goals can now be found in the care plan section) Acute Rehab PT Goals Patient Stated Goal: Return home; feel better PT Goal Formulation: With patient/family Time For Goal Achievement:  06/12/15 Potential to Achieve Goals: Good Progress towards PT goals: Progressing toward goals    Frequency  Min 3X/week    PT Plan Current plan remains appropriate;Equipment recommendations need to be updated    Co-evaluation             End of Session Equipment Utilized During Treatment: Gait belt;Oxygen Activity Tolerance: Patient tolerated treatment well Patient left: in chair;with call bell/phone within reach;with family/visitor present     Time: 1228-1252 PT Time Calculation (min) (ACUTE ONLY): 24 min  Charges:  $Gait Training: 23-37 mins                    G Codes:     Leighton Roach, PT  Acute Rehab Services  [place], [place] 06/07/2015, 1:40 PM

## 2015-06-08 DIAGNOSIS — I5032 Chronic diastolic (congestive) heart failure: Secondary | ICD-10-CM

## 2015-06-08 LAB — BASIC METABOLIC PANEL
ANION GAP: 16 — AB (ref 5–15)
BUN: 103 mg/dL — ABNORMAL HIGH (ref 6–20)
CHLORIDE: 73 mmol/L — AB (ref 101–111)
CO2: 38 mmol/L — ABNORMAL HIGH (ref 22–32)
CREATININE: 2.67 mg/dL — AB (ref 0.44–1.00)
Calcium: 9.7 mg/dL (ref 8.9–10.3)
GFR calc non Af Amer: 18 mL/min — ABNORMAL LOW (ref >60)
GFR, EST AFRICAN AMERICAN: 21 mL/min — AB (ref >60)
Glucose, Bld: 115 mg/dL — ABNORMAL HIGH (ref 65–99)
Potassium: 3.1 mmol/L — ABNORMAL LOW (ref 3.5–5.1)
SODIUM: 127 mmol/L — AB (ref 135–145)

## 2015-06-08 LAB — GLUCOSE, CAPILLARY
GLUCOSE-CAPILLARY: 144 mg/dL — AB (ref 65–99)
GLUCOSE-CAPILLARY: 125 mg/dL — AB (ref 65–99)
Glucose-Capillary: 290 mg/dL — ABNORMAL HIGH (ref 65–99)
Glucose-Capillary: 278 mg/dL — ABNORMAL HIGH (ref 65–99)

## 2015-06-08 NOTE — Progress Notes (Signed)
Advanced Heart Failure Rounding Note   Subjective:    Breathing ok. Weight up 2 pounds. Creatinine down to 2.5  K low and has been repleted. Sodium up to 127.    Objective:   Weight Range:  Vital Signs:   Temp:  [96.7 F (35.9 C)-99.2 F (37.3 C)] 99.2 F (37.3 C) (08/11 1632) Pulse Rate:  [65-76] 72 (08/11 1632) Resp:  [8-15] 13 (08/11 1800) BP: (99-131)/(50-74) 126/70 mmHg (08/11 1800) SpO2:  [93 %-96 %] 94 % (08/11 1632) Weight:  [70.7 kg (155 lb 13.8 oz)] 70.7 kg (155 lb 13.8 oz) (08/11 0500) Last BM Date: 06/07/15  Weight change: Filed Weights   06/06/15 0500 06/07/15 0500 06/08/15 0500  Weight: 69.3 kg (152 lb 12.5 oz) 69.5 kg (153 lb 3.5 oz) 70.7 kg (155 lb 13.8 oz)    Intake/Output:   Intake/Output Summary (Last 24 hours) at 06/08/15 1935 Last data filed at 06/08/15 1859  Gross per 24 hour  Intake    723 ml  Output   1351 ml  Net   -628 ml      Physical Exam: General: Sitting in chair. No resp difficulty. Son present HEENT: normal Neck: supple. JVP 6-8. Carotids 2+ bilat; no bruits. No lymphadenopathy or thryomegaly appreciated. Cor: PMI nondisplaced. regular. No rubs, gallops. Prominent P2. 1/6 HSM LLSB.  Lungs: decreased throughout Abdomen: soft, nontender, + distended. No hepatosplenomegaly. No bruits or masses. Good bowel sounds. Extremities: no cyanosis, + clubbing, 1+ edema to knees bilaterally Neuro: alert & oriented x 3, cranial nerves grossly intact. moves all 4 extremities w/o difficulty. Affect pleasant  Telemetry: NSR  Labs: Basic Metabolic Panel:  Recent Labs Lab 06/02/15 1715  06/04/15 0316 06/04/15 2333 06/06/15 0016 06/07/15 0242 06/08/15 0322  NA  --   < > 121* 124* 123* 124* 127*  K  --   < > 4.0 4.0 3.6 2.7* 3.1*  CL  --   < > 73* 73* 71* 71* 73*  CO2  --   < > 32 34* 33* 34* 38*  GLUCOSE  --   < > 188* 143* 157* 140* 115*  BUN  --   < > 53* 70* 92* 104* 103*  CREATININE  --   < > 2.24* 2.83* 3.17* 3.06* 2.67*   CALCIUM  --   < > 9.1 9.5 9.0 9.3 9.7  MG 1.9  --   --   --   --   --   --   PHOS 4.4  --   --   --   --   --   --   < > = values in this interval not displayed.  Liver Function Tests: No results for input(s): AST, ALT, ALKPHOS, BILITOT, PROT, ALBUMIN in the last 168 hours. No results for input(s): LIPASE, AMYLASE in the last 168 hours. No results for input(s): AMMONIA in the last 168 hours.  CBC:  Recent Labs Lab 06/02/15 1717 06/02/15 2206 06/03/15 0559 06/04/15 0316 06/06/15 0016  WBC 10.3 9.2 9.3 15.3* 12.9*  NEUTROABS 6.7  --  5.9  --   --   HGB 14.1 13.8 14.3 15.7* 15.4*  HCT 40.5 38.8 40.3 43.4 43.6  MCV 86.7 84.7 84.3 85.3 86.0  PLT 241 244 255 257 267    Cardiac Enzymes: No results for input(s): CKTOTAL, CKMB, CKMBINDEX, TROPONINI in the last 168 hours.  BNP: BNP (last 3 results)  Recent Labs  12/04/14 1103 01/17/15 1000 06/02/15 1717  BNP 959.4* 179.4* 21.5  ProBNP (last 3 results) No results for input(s): PROBNP in the last 8760 hours.    Other results:  Imaging: No results found.   Medications:     Scheduled Medications: . antiseptic oral rinse  7 mL Mouth Rinse BID  . aspirin  325 mg Oral Daily  . enoxaparin (LOVENOX) injection  30 mg Subcutaneous Q24H  . gabapentin  300 mg Oral Daily  . insulin aspart  0-9 Units Subcutaneous TID WC  . loratadine  10 mg Oral BID  . methadone  20 mg Oral QID  . metoprolol tartrate  12.5 mg Oral BID  . mirtazapine  7.5 mg Oral QHS  . pantoprazole  40 mg Oral BID  . pravastatin  80 mg Oral q1800  . sodium chloride  3 mL Intravenous Q12H    Infusions:    PRN Medications: acetaminophen **OR** acetaminophen, ALPRAZolam, levalbuterol, ondansetron **OR** ondansetron (ZOFRAN) IV   Assessment:   1. Acute on chronic diastolic HF 2. PAH with cor pulmonale 3. Severe COPD 4. Chronic respiratory failure 5. Hyponatremia 6. Acute on chronic renal failure (baseline cr ~2.0)  Plan/Discussion:      Renal function improving - almost back to baseline. Weight coming back up. Will restart diuretics in am. Given PAH with cor pulmonale she is preload dependent so have to go slow.   K is low but has been repleted. Sodium improving. Hopefully can go home tomorrow.    Length of Stay: 6   Bensimhon, Daniel MD 06/08/2015, 7:35 PM  Advanced Heart Failure Team Pager [phone] (M-F; [place])  Please contact [facility] for night-coverage after hours (4p -7a ) and weekends on [email or web address]

## 2015-06-08 NOTE — Patient Outreach (Signed)
[place] [facility]) Care Management  06/08/2015  LAYNA ROEPER 09-Sep-1956 [id number]   Patient was admitted to hospital on 06-02-15 with hyponatremia, abdominal swelling, pulmonary hypertension, and chronic respiratory failure with hypoxia.  Remains in hospital as of today.  [facility] is aware.  Candie Mile, RN, MSN [place] [phone] Fax [phone]

## 2015-06-08 NOTE — Care Management Important Message (Signed)
Important Message  Patient Details  Name: Lindsay Harmon MRN: [id number] Date of Birth: April 15, 1956   Medicare Important Message Given:  Yes-third notification given    Pricilla Handler 06/08/2015, 11:42 AM

## 2015-06-08 NOTE — Consult Note (Signed)
   [facility] CM Inpatient Consult   06/08/2015  Lindsay Harmon 01/06/1956 [id number] Patient is  active (prior to admission) with [facility] for chronic disease management services.  Our community based plan of care has focused on disease management and community resource support. This Probation officer made [place],  aware of patient's hospital admission this morning.   Will continue to follow for discharge planning needs. Of note, [facility] Care Management services does not replace or interfere with any services that are arranged by inpatient case management or social work.  For additional questions or referrals please contact: Natividad Brood, RN BSN [facility] Liaison  [phone] business mobile phone

## 2015-06-09 DIAGNOSIS — E1165 Type 2 diabetes mellitus with hyperglycemia: Secondary | ICD-10-CM

## 2015-06-09 DIAGNOSIS — I2781 Cor pulmonale (chronic): Secondary | ICD-10-CM | POA: Insufficient documentation

## 2015-06-09 DIAGNOSIS — I272 Other secondary pulmonary hypertension: Secondary | ICD-10-CM

## 2015-06-09 DIAGNOSIS — IMO0002 Reserved for concepts with insufficient information to code with codable children: Secondary | ICD-10-CM | POA: Insufficient documentation

## 2015-06-09 DIAGNOSIS — I5033 Acute on chronic diastolic (congestive) heart failure: Secondary | ICD-10-CM | POA: Insufficient documentation

## 2015-06-09 LAB — BASIC METABOLIC PANEL
ANION GAP: 13 (ref 5–15)
BUN: 98 mg/dL — AB (ref 6–20)
CALCIUM: 9.4 mg/dL (ref 8.9–10.3)
CHLORIDE: 76 mmol/L — AB (ref 101–111)
CO2: 38 mmol/L — AB (ref 22–32)
CREATININE: 2.15 mg/dL — AB (ref 0.44–1.00)
GFR calc Af Amer: 27 mL/min — ABNORMAL LOW (ref >60)
GFR calc non Af Amer: 24 mL/min — ABNORMAL LOW (ref >60)
Glucose, Bld: 130 mg/dL — ABNORMAL HIGH (ref 65–99)
Potassium: 3.5 mmol/L (ref 3.5–5.1)
Sodium: 127 mmol/L — ABNORMAL LOW (ref 135–145)

## 2015-06-09 LAB — GLUCOSE, CAPILLARY
GLUCOSE-CAPILLARY: 219 mg/dL — AB (ref 65–99)
GLUCOSE-CAPILLARY: 224 mg/dL — AB (ref 65–99)
Glucose-Capillary: 191 mg/dL — ABNORMAL HIGH (ref 65–99)

## 2015-06-09 MED ORDER — MIRTAZAPINE 7.5 MG PO TABS: 7.5000 mg | ORAL_TABLET | Freq: Every day | ORAL | Status: DC

## 2015-06-09 MED ORDER — LOVASTATIN 40 MG PO TABS: ORAL_TABLET | ORAL | Status: DC

## 2015-06-09 MED ORDER — GABAPENTIN 300 MG PO CAPS: 300.0000 mg | ORAL_CAPSULE | Freq: Every day | ORAL | Status: DC

## 2015-06-09 NOTE — Progress Notes (Signed)
Physical Therapy Treatment Patient Details Name: Lindsay Harmon MRN: [id number] DOB: 1956/03/02 Today's Date: 06/09/2015    History of Present Illness Pt is a 59 y/o female with a PMH of cor pulmonale, diastolic CHF, COPD, diabetes mellitus and chronic kidney disease was referred to the ER after patient's blood work showed that patient had sodium of 119. Patient is otherwise asymptomatic. Patient's sodium was reconfirmed in the ER to be 119. Patient denies having had excessive fluid intake. Patient was recently placed on metolazone last 2 days for her CHF. Patient also was recently started on spironolactone. Patient takes torsemide. Despite which patient states she thinks she is gaining weight and has abdominal distention. On exam patient has mild lower extremity edema. Chest x-ray shows features concerning for CHF. On exam patient is not in distress. Patient has been admitted for severe hyponatremia.     PT Comments    Patient in bed, agreeable to participate in PT today. Patient was able to ambulate and transfer as described below. Patient is planning d/c later today, reports that she will get a Rollator to help her get around when she goes home. Patient states that she is on oxygen at home and has a ramp to enter her house. Patient will benefit from continued PT to improve ambulatory endurance and increase independence.    Follow Up Recommendations  Home health PT;Supervision - Intermittent     Equipment Recommendations  Other (comment) (Rollator)    Recommendations for Other Services       Precautions / Restrictions Precautions Precautions: Fall Restrictions Weight Bearing Restrictions: No    Mobility  Bed Mobility Overal bed mobility: Modified Independent Bed Mobility: Supine to Sit     Supine to sit: Modified independent (Device/Increase time)     General bed mobility comments: Patient able to get self to EOB without assist or safety concerns  Transfers Overall transfer  level: Modified independent Equipment used: Rolling walker (2 wheeled) Transfers: Sit to/from Stand Sit to Stand: Supervision         General transfer comment: Supervision for untanglement of lines and leads. No physical assistance necessary.  Ambulation/Gait Ambulation/Gait assistance: Supervision Ambulation Distance (Feet): 100 Feet Assistive device: Rolling walker (2 wheeled) Gait Pattern/deviations: Decreased stride length;Step-through pattern Gait velocity: Decreased Gait velocity interpretation: Below normal speed for age/gender General Gait Details: Patient able to walk with supervision for management of oxygen tank and monitor. Vitals were steady throughout. No LOB noted. Patient able to navigate tight corners and hallway safely without assistance. Could have walked more but was shortened due to imminent discharge home.   Stairs            Wheelchair Mobility    Modified Rankin (Stroke Patients Only)       Balance Overall balance assessment: Modified Independent Sitting-balance support: Feet supported Sitting balance-Leahy Scale: Normal     Standing balance support: Bilateral upper extremity supported Standing balance-Leahy Scale: Good Standing balance comment: Patient stable with use of RW.                    Cognition Arousal/Alertness: Awake/alert Behavior During Therapy: WFL for tasks assessed/performed Overall Cognitive Status: Within Functional Limits for tasks assessed                      Exercises      General Comments        Pertinent Vitals/Pain Pain Assessment: No/denies pain  VSS    Home Living  Prior Function            PT Goals (current goals can now be found in the care plan section) Acute Rehab PT Goals Patient Stated Goal: Return home later today PT Goal Formulation: With patient Time For Goal Achievement: 06/12/15 Potential to Achieve Goals: Good Progress towards PT goals:  Progressing toward goals    Frequency  Min 3X/week    PT Plan Current plan remains appropriate    Co-evaluation             End of Session Equipment Utilized During Treatment: Gait belt;Oxygen (Pt is on oxygen at home.) Activity Tolerance: Patient tolerated treatment well Patient left: in chair;with call bell/phone within reach;with family/visitor present     Time: 1205-1227 PT Time Calculation (min) (ACUTE ONLY): 22 min  Charges:  $Gait Training: 8-22 mins                    G CodesRoanna Epley, SPT [phone]  06/09/2015, 4:08 PM I have read, reviewed and agree with student's note.   [place] [phone] (pager)

## 2015-06-09 NOTE — Discharge Summary (Addendum)
Physician Discharge Summary  ANDE THERRELL AOZ:[id number] DOB: [birth year] DOA: 06/02/2015  PCP: Viviana Simpler, MD  Admit date: 06/02/2015 Discharge date: 06/09/2015  Time spent: 40 minutes  Recommendations for Outpatient Follow-up:  Severe hyponatremia -Felt to be due to volume overload/CHF compounded by aldactone; would not restart unless cleared by CHF clinic   -Decreased patient's Remeron which can cause hyponatremia. Would continue at 7.5 mg QHS; do not increase and was cleared by CHF,  Acute exacerbation of Chronic diastolic congestive heart failure and RV failure/cor pulmonale with PAH  Followed in CHF clinic - status post right heart cath February 2016 - not felt to be a candidate for selective -pulmonary vasodilators  -DISCHARGE WEIGHT; 69.8 kg. Weigh herself as soon as you get home and record off of your scale. This will be your dry weight.  -Follow-up in CHF clinic 06/22/2015 at 1020  DM type 2 uncontrolled -8/3 hemoglobin A1c= 7.1 -Diabetes reasonably well controlled follow-up with PCP for medication adjustments.  -Will discharge on previous home regimen  COPD -Well compensated presently without evidence of wheezing, continue home medication follow-up PCP   Acute on CKD stage III (baseline Cr~1.33-1.74) -Discharge Cr= 2.15 denied baseline trending down  - Defer to CHF clinic on diuretic use  -Patient instructed by Dr.Daniel R Bensimhon (heart failure) restart diuretic use as instructed and to follow-up in CHF clinic with Dr.Daniel R Bensimhon (heart failure)       Discharge Diagnoses:  Principal Problem:   Hyponatremia Active Problems:   Chronic kidney disease, stage III (moderate)   Chronic diastolic heart failure   Pulmonary HTN   Chronic respiratory failure with hypoxia   COPD (chronic obstructive pulmonary disease)   Abdominal swelling   Discharge Condition: stable  Diet recommendation: Heart Healthy/diabetic  Filed Weights   06/07/15 0500 06/08/15  0500 06/09/15 0415  Weight: 69.5 kg (153 lb 3.5 oz) 70.7 kg (155 lb 13.8 oz) 69.8 kg (153 lb 14.1 oz)    History of present illness:  59 y.o. WF PMHx Cor Pulmonale, Diastolic CHF, Severe COPD, Diabetes Mellitus uncontrolled, and Cronic [place].  Referred to the ER after patient's blood work showed that patient had sodium of 119. Patient was otherwise asymptomatic. Patient's sodium was reconfirmed in the ER to be 119. Patient denied excessive fluid intake. Patient was placed on metolazone 2 days prior for her CHF. Patient also was recently started on spironolactone. Patient takes torsemide. On exam patient had mild lower extremity edema. Chest x-ray showed features concerning for CHF.  During this hospitalization patient was treated for severe hyponatremia, acute exacerbation of chronic diastolic heart failure; right ventricle failure; cor pulmonale with PAH. Patient was aggressively diuresed (~6.6 L) with resulting increase in patients sodium level to 127   Consultants: Dr.Daniel R Bensimhon (cardiology)     Discharge Exam: Filed Vitals:   06/09/15 0000 06/09/15 0400 06/09/15 0415 06/09/15 0800  BP: 119/50 112/57  112/84  Pulse: 73 65  66  Temp: 98.5 F (36.9 C) 98.2 F (36.8 C)  97.4 F (36.3 C)  TempSrc: Oral Oral  Oral  Resp: _0 Height:      Weight:   69.8 kg (153 lb 14.1 oz)   SpO2: 96% 96%  98%    General: A/O 4, NAD, No acute respiratory distress Lungs: Clear to auscultation bilaterally without wheezes or crackles Cardiovascular: Regular rate and rhythm without murmur gallop or rub normal S1 and S2 Abdomen:negative abdominal pain, negative dysphagia, nondistended, positive soft, bowel  sounds, no rebound, no ascites, no appreciable mass    Discharge Instructions     Medication List    ASK your doctor about these medications        albuterol 108 (90 BASE) MCG/ACT inhaler  Commonly known as:  PROVENTIL HFA;VENTOLIN HFA  Inhale 2 puffs into the  lungs every 6 (six) hours as needed for wheezing or shortness of breath.     ALPRAZolam 0.25 MG tablet  Commonly known as:  XANAX  TAKE ONE (1) TABLET EACH DAY     aspirin 325 MG tablet  Take 325 mg by mouth daily.     gabapentin 300 MG capsule  Commonly known as:  NEURONTIN  TAKE 1 CAPSULE FOUR TIMES DAILY     loratadine 10 MG tablet  Commonly known as:  CLARITIN  Take 10 mg by mouth 2 (two) times daily.     lovastatin 40 MG tablet  Commonly known as:  MEVACOR  TAKE 2 TABLETS EVERY DAY     metFORMIN 1000 MG tablet  Commonly known as:  GLUCOPHAGE  Take 500 mg by mouth 2 (two) times daily with a meal.     methadone 10 MG tablet  Commonly known as:  DOLOPHINE  Take 2 tablets four times a day.     metolazone 2.5 MG tablet  Commonly known as:  ZAROXOLYN  Take 1 tablet (2.5 mg total) by mouth as needed.     metoprolol tartrate 25 MG tablet  Commonly known as:  LOPRESSOR  Take 0.5 tablets (12.5 mg total) by mouth 2 (two) times daily.     mirtazapine 30 MG tablet  Commonly known as:  REMERON  TAKE 1/2 TO 1 TABLET AT BEDTIME     pantoprazole 40 MG tablet  Commonly known as:  PROTONIX  TAKE 1 TABLET TWICE DAILY     Potassium Chloride ER 20 MEQ Tbcr  Take 20 meq with Metolazone.     spironolactone 50 MG tablet  Commonly known as:  ALDACTONE  Take 1-2 tablets (50-100 mg total) by mouth daily.     Tiotropium Bromide-Olodaterol 2.5-2.5 MCG/ACT Aers  Commonly known as:  STIOLTO RESPIMAT  Inhale 2 puffs into the lungs daily.     torsemide 20 MG tablet  Commonly known as:  DEMADEX  Take 3 tablets (60 mg total) by mouth 2 (two) times daily.       No Known Allergies     Follow-up Information    Follow up with Glori Bickers, MD. Go on 06/13/2015.   Specialty:  Cardiology   Why:  at 11:20 am in the [facility]--gate code [id number]--please bring all medications to appt   Contact information:   [place] [place]  [id number] [phone]        The results of significant diagnostics from this hospitalization (including imaging, microbiology, ancillary and laboratory) are listed below for reference.    Significant Diagnostic Studies: Dg Chest 2 View  06/02/2015   CLINICAL DATA:  Exertional shortness of breath, abdominal fluid retention  EXAM: CHEST  2 VIEW  COMPARISON:  12/03/2014  FINDINGS: Stable mild cardiac enlargement. Mild to moderate vascular congestion with moderate diffuse interstitial prominence. Bibasilar atelectasis. No pleural effusion.  IMPRESSION: Findings again suggestive of congestive heart failure with mild to moderate pulmonary edema   Electronically Signed   By: Skipper Cliche M.D.   On: 06/02/2015 17:33   US Abdomen Complete  06/02/2015   CLINICAL DATA:  Acute onset  of generalized abdominal bloating and pain. Initial encounter.  EXAM: ULTRASOUND ABDOMEN COMPLETE  COMPARISON:  Abdominal ultrasound performed 12/05/2014  FINDINGS: Gallbladder: No gallstones or wall thickening visualized. No sonographic Murphy sign noted.  Common bile duct: Diameter: 0.5 cm, within normal limits in caliber.  Liver: No focal lesion identified. Within normal limits in parenchymal echogenicity.  IVC: No abnormality visualized.  Pancreas: Visualized portion unremarkable.  Spleen: Size and appearance within normal limits.  Right Kidney: Length: 9.6 cm. Echogenicity within normal limits. No mass or hydronephrosis visualized.  Left Kidney: Length: 9.3 cm. Echogenicity within normal limits. No mass or hydronephrosis visualized.  Abdominal aorta: Not characterized due to overlying bowel gas.  Other findings: None.  IMPRESSION: Unremarkable abdominal ultrasound.   Electronically Signed   By: Garald Balding M.D.   On: 06/02/2015 21:39    Microbiology: Recent Results (from the past 240 hour(s))  MRSA PCR Screening     Status: None   Collection Time: 06/02/15 10:31 PM  Result Value Ref Range Status   MRSA by PCR NEGATIVE  NEGATIVE Final    Comment:        The GeneXpert MRSA Assay (FDA approved for NASAL specimens only), is one component of a comprehensive MRSA colonization surveillance program. It is not intended to diagnose MRSA infection nor to guide or monitor treatment for MRSA infections.      Labs: Basic Metabolic Panel:  Recent Labs Lab 06/02/15 1715  06/04/15 2333 06/06/15 0016 06/07/15 0242 06/08/15 0322 06/09/15 0257  NA  --   < > 124* 123* 124* 127* 127*  K  --   < > 4.0 3.6 2.7* 3.1* 3.5  CL  --   < > 73* 71* 71* 73* 76*  CO2  --   < > 34* 33* 34* 38* 38*  GLUCOSE  --   < > 143* 157* 140* 115* 130*  BUN  --   < > 70* 92* 104* 103* 98*  CREATININE  --   < > 2.83* 3.17* 3.06* 2.67* 2.15*  CALCIUM  --   < > 9.5 9.0 9.3 9.7 9.4  MG 1.9  --   --   --   --   --   --   PHOS 4.4  --   --   --   --   --   --   < > = values in this interval not displayed. Liver Function Tests: No results for input(s): AST, ALT, ALKPHOS, BILITOT, PROT, ALBUMIN in the last 168 hours. No results for input(s): LIPASE, AMYLASE in the last 168 hours. No results for input(s): AMMONIA in the last 168 hours. CBC:  Recent Labs Lab 06/02/15 1717 06/02/15 2206 06/03/15 0559 06/04/15 0316 06/06/15 0016  WBC 10.3 9.2 9.3 15.3* 12.9*  NEUTROABS 6.7  --  5.9  --   --   HGB 14.1 13.8 14.3 15.7* 15.4*  HCT 40.5 38.8 40.3 43.4 43.6  MCV 86.7 84.7 84.3 85.3 86.0  PLT 241 244 255 257 267   Cardiac Enzymes: No results for input(s): CKTOTAL, CKMB, CKMBINDEX, TROPONINI in the last 168 hours. BNP: BNP (last 3 results)  Recent Labs  12/04/14 1103 01/17/15 1000 06/02/15 1717  BNP 959.4* 179.4* 21.5    ProBNP (last 3 results) No results for input(s): PROBNP in the last 8760 hours.  CBG:  Recent Labs Lab 06/07/15 2201 06/08/15 0753 06/08/15 1257 06/08/15 1658 06/08/15 2132  GLUCAP 193* 144* 290* 125* 278*       Signed:  Dia Crawford, MD [facility] [phone] pager

## 2015-06-09 NOTE — Progress Notes (Signed)
Advanced Heart Failure Rounding Note   Subjective:    Feels good. Weight down 2 pounds. Renal function better. Sodium up to 127.    Objective:   Weight Range:  Vital Signs:   Temp:  [97.4 F (36.3 C)-99.2 F (37.3 C)] 97.6 F (36.4 C) (08/12 1200) Pulse Rate:  [65-73] 65 (08/12 1200) Resp:  [10-20] 10 (08/12 1200) BP: (111-138)/(50-84) 113/58 mmHg (08/12 1200) SpO2:  [90 %-98 %] 90 % (08/12 1200) Weight:  [69.8 kg (153 lb 14.1 oz)] 69.8 kg (153 lb 14.1 oz) (08/12 0415) Last BM Date: 06/08/15  Weight change: Filed Weights   06/07/15 0500 06/08/15 0500 06/09/15 0415  Weight: 69.5 kg (153 lb 3.5 oz) 70.7 kg (155 lb 13.8 oz) 69.8 kg (153 lb 14.1 oz)    Intake/Output:   Intake/Output Summary (Last 24 hours) at 06/09/15 1407 Last data filed at 06/09/15 0909  Gross per 24 hour  Intake    513 ml  Output    350 ml  Net    163 ml      Physical Exam: General: Sitting in chair. No resp difficulty. Son present HEENT: normal Neck: supple. JVP 6-8. Carotids 2+ bilat; no bruits. No lymphadenopathy or thryomegaly appreciated. Cor: PMI nondisplaced. regular. No rubs, gallops. Prominent P2. 1/6 HSM LLSB.  Lungs: decreased throughout Abdomen: soft, nontender, + distended. No hepatosplenomegaly. No bruits or masses. Good bowel sounds. Extremities: no cyanosis, + clubbing, 1+ edema to knees bilaterally Neuro: alert & oriented x 3, cranial nerves grossly intact. moves all 4 extremities w/o difficulty. Affect pleasant  Telemetry: NSR  Labs: Basic Metabolic Panel:  Recent Labs Lab 06/02/15 1715  06/04/15 2333 06/06/15 0016 06/07/15 0242 06/08/15 0322 06/09/15 0257  NA  --   < > 124* 123* 124* 127* 127*  K  --   < > 4.0 3.6 2.7* 3.1* 3.5  CL  --   < > 73* 71* 71* 73* 76*  CO2  --   < > 34* 33* 34* 38* 38*  GLUCOSE  --   < > 143* 157* 140* 115* 130*  BUN  --   < > 70* 92* 104* 103* 98*  CREATININE  --   < > 2.83* 3.17* 3.06* 2.67* 2.15*  CALCIUM  --   < > 9.5 9.0 9.3  9.7 9.4  MG 1.9  --   --   --   --   --   --   PHOS 4.4  --   --   --   --   --   --   < > = values in this interval not displayed.  Liver Function Tests: No results for input(s): AST, ALT, ALKPHOS, BILITOT, PROT, ALBUMIN in the last 168 hours. No results for input(s): LIPASE, AMYLASE in the last 168 hours. No results for input(s): AMMONIA in the last 168 hours.  CBC:  Recent Labs Lab 06/02/15 1717 06/02/15 2206 06/03/15 0559 06/04/15 0316 06/06/15 0016  WBC 10.3 9.2 9.3 15.3* 12.9*  NEUTROABS 6.7  --  5.9  --   --   HGB 14.1 13.8 14.3 15.7* 15.4*  HCT 40.5 38.8 40.3 43.4 43.6  MCV 86.7 84.7 84.3 85.3 86.0  PLT 241 244 255 257 267    Cardiac Enzymes: No results for input(s): CKTOTAL, CKMB, CKMBINDEX, TROPONINI in the last 168 hours.  BNP: BNP (last 3 results)  Recent Labs  12/04/14 1103 01/17/15 1000 06/02/15 1717  BNP 959.4* 179.4* 21.5    ProBNP (last 3  results) No results for input(s): PROBNP in the last 8760 hours.    Other results:  Imaging: No results found.   Medications:     Scheduled Medications: . antiseptic oral rinse  7 mL Mouth Rinse BID  . aspirin  325 mg Oral Daily  . enoxaparin (LOVENOX) injection  30 mg Subcutaneous Q24H  . gabapentin  300 mg Oral Daily  . insulin aspart  0-9 Units Subcutaneous TID WC  . loratadine  10 mg Oral BID  . methadone  20 mg Oral QID  . metoprolol tartrate  12.5 mg Oral BID  . mirtazapine  7.5 mg Oral QHS  . pantoprazole  40 mg Oral BID  . pravastatin  80 mg Oral q1800  . sodium chloride  3 mL Intravenous Q12H    Infusions:    PRN Medications: acetaminophen **OR** acetaminophen, ALPRAZolam, levalbuterol, ondansetron **OR** ondansetron (ZOFRAN) IV   Assessment:   1. Acute on chronic diastolic HF 2. PAH with cor pulmonale 3. Severe COPD 4. Chronic respiratory failure 5. Hyponatremia 6. Acute on chronic renal failure (baseline cr ~2.0)  Plan/Discussion:     Renal function improving - now   back to baseline. Weight stable. Can go home today.  Restart diuretics. Use metolazone only prn.  Length of Stay: 7   Glori Bickers MD 06/09/2015, 2:07 PM  Advanced Heart Failure Team Pager [phone] (M-F; 7a - 4p)  Please contact [facility] for night-coverage after hours (4p -7a ) and weekends on [email or web address]

## 2015-06-10 NOTE — Care Management Note (Signed)
Case Management Note  Patient Details  Name: Lindsay Harmon MRN: [id number] Date of Birth: 04-May-1956  Subjective/Objective:                   Low sodium. Action/Plan:  DISCHARGE PLANNING Expected Discharge Date:  06/13/15               Expected Discharge Plan:  [place]  In-House Referral:     Discharge planning Services  CM Consult  Post Acute Care Choice:  Home Health Choice offered to:     DME Arranged:    DME Agency:     HH Arranged:  PT [place]:  [place]  Status of Service:  Completed, signed off  Medicare Important Message Given:  Yes-third notification given Date Medicare IM Given:    Medicare IM give by:    Date Additional Medicare IM Given:    Additional Medicare Important Message give by:     If discussed at [place] of Stay Meetings, dates discussed:    Additional Comments: CM received call from [facility] stating no face to face or [place] orders were placed prior to discharge.  CM notified MD who placed orders.  Cm notified [facility] for [place].  No other Cm needs were communicated. Dellie Catholic, RN 06/10/2015, 4:41 PM

## 2015-06-12 ENCOUNTER — Telehealth: Payer: Self-pay | Admitting: *Deleted

## 2015-06-12 NOTE — Telephone Encounter (Signed)
Noted  

## 2015-06-12 NOTE — Telephone Encounter (Signed)
Transition Care Management Follow-up Telephone Call   Date discharged? 06/09/15   How have you been since you were released from the hospital? Improving   Do you understand why you were in the hospital? yes   Do you understand the discharge instructions? yes   Where were you discharged to? Home   Items Reviewed:  Medications reviewed: yes  Allergies reviewed: yes  Dietary changes reviewed: no  Referrals reviewed: yes, cardiology   Functional Questionnaire:   Activities of Daily Living (ADLs):   She states they are independent in the following: ambulation, bathing and hygiene, feeding, continence, grooming, toileting and dressing States they require assistance with the following: None   Any transportation issues/concerns?: no   Any patient concerns? yes, possible medication changes - will discuss at f/u   Confirmed importance and date/time of follow-up visits scheduled yes, 06/14/15 at 1015  Provider Appointment booked with Viviana Simpler, MD  Confirmed with patient if condition begins to worsen call PCP or go to the ER.  Patient was given the office number and encouraged to call back with question or concerns.  : yes

## 2015-06-13 ENCOUNTER — Encounter (HOSPITAL_COMMUNITY): Payer: Commercial Managed Care - HMO

## 2015-06-14 ENCOUNTER — Encounter (INDEPENDENT_AMBULATORY_CARE_PROVIDER_SITE_OTHER): Payer: Self-pay

## 2015-06-14 ENCOUNTER — Ambulatory Visit (INDEPENDENT_AMBULATORY_CARE_PROVIDER_SITE_OTHER): Payer: Commercial Managed Care - HMO | Admitting: Internal Medicine

## 2015-06-14 ENCOUNTER — Encounter: Payer: Self-pay | Admitting: Internal Medicine

## 2015-06-14 ENCOUNTER — Encounter: Payer: Self-pay | Admitting: *Deleted

## 2015-06-14 VITALS — BP 122/68 | HR 77 | Temp 98.2°F | Wt 150.0 lb

## 2015-06-14 DIAGNOSIS — I5033 Acute on chronic diastolic (congestive) heart failure: Secondary | ICD-10-CM | POA: Diagnosis not present

## 2015-06-14 DIAGNOSIS — N179 Acute kidney failure, unspecified: Secondary | ICD-10-CM | POA: Diagnosis not present

## 2015-06-14 DIAGNOSIS — N183 Chronic kidney disease, stage 3 unspecified: Secondary | ICD-10-CM

## 2015-06-14 DIAGNOSIS — E1140 Type 2 diabetes mellitus with diabetic neuropathy, unspecified: Secondary | ICD-10-CM

## 2015-06-14 DIAGNOSIS — E1149 Type 2 diabetes mellitus with other diabetic neurological complication: Secondary | ICD-10-CM

## 2015-06-14 LAB — RENAL FUNCTION PANEL
ALBUMIN: 4 g/dL (ref 3.5–5.2)
BUN: 70 mg/dL — ABNORMAL HIGH (ref 6–23)
CO2: 41 mEq/L — ABNORMAL HIGH (ref 19–32)
CREATININE: 2 mg/dL — AB (ref 0.40–1.20)
Calcium: 9.9 mg/dL (ref 8.4–10.5)
Chloride: 82 mEq/L — ABNORMAL LOW (ref 96–112)
GFR: 26.87 mL/min — AB (ref >60.00)
GLUCOSE: 268 mg/dL — AB (ref 70–99)
Phosphorus: 2.6 mg/dL (ref 2.3–4.6)
Potassium: 4.6 mEq/L (ref 3.5–5.1)
Sodium: 129 mEq/L — ABNORMAL LOW (ref 135–145)

## 2015-06-14 MED ORDER — GABAPENTIN 300 MG PO CAPS: 300.0000 mg | ORAL_CAPSULE | Freq: Two times a day (BID) | ORAL | Status: DC

## 2015-06-14 NOTE — Assessment & Plan Note (Signed)
Lab Results  Component Value Date   HGBA1C 7.1* 05/31/2015   Will continue low dose metformin unless renal function worsens Go back to previous gabapentin for the neuropathy

## 2015-06-14 NOTE — Progress Notes (Signed)
Subjective:    Patient ID: Lindsay Harmon, female    DOB: [birth year], 59 y.o.   MRN: [id number]  HPI Here for hospital follow up With mom  Hospitalized with hyponatremia Spironolactone stopped Worsened renal insufficiency delayed discharge  Still weak but feeling better  No chest pain Breathing okay--- most activity is taking shower. Able to do this without oxygen with minimal dyspnea Not doing any instrumental ADLs--- mom is staying with her for now No PND Swelling gone--even in abomen  Has home health, PT and OT Weakness and unsteadiness noted with increased fall risk Recommends 4 wheeled walker--will write Rx  Sugars fluctuating a lot No hypoglycemic reactions 119-175  Current Outpatient Prescriptions on File Prior to Visit  Medication Sig Dispense Refill  . albuterol (PROVENTIL HFA;VENTOLIN HFA) 108 (90 BASE) MCG/ACT inhaler Inhale 2 puffs into the lungs every 6 (six) hours as needed for wheezing or shortness of breath. 1 Inhaler 2  . ALPRAZolam (XANAX) 0.25 MG tablet TAKE ONE (1) TABLET EACH DAY 90 tablet 0  . aspirin 325 MG tablet Take 325 mg by mouth daily.      Marland Kitchen gabapentin (NEURONTIN) 300 MG capsule Take 1 capsule (300 mg total) by mouth daily. 360 capsule 3  . loratadine (CLARITIN) 10 MG tablet Take 10 mg by mouth 2 (two) times daily.     Marland Kitchen lovastatin (MEVACOR) 40 MG tablet TAKE 80 MG BY MOUTH DAILY AT BEDTIME 180 tablet 3  . metFORMIN (GLUCOPHAGE) 1000 MG tablet Take 500 mg by mouth 2 (two) times daily with a meal.    . methadone (DOLOPHINE) 10 MG tablet Take 2 tablets four times a day. (Patient taking differently: Take 20 mg by mouth 4 (four) times daily. Take 2 tablets four times a day.) 240 tablet 0  . metoprolol tartrate (LOPRESSOR) 25 MG tablet Take 0.5 tablets (12.5 mg total) by mouth 2 (two) times daily. 60 tablet 0  . mirtazapine (REMERON) 7.5 MG tablet Take 1 tablet (7.5 mg total) by mouth at bedtime. 30 tablet 0  . pantoprazole (PROTONIX) 40 MG tablet  TAKE 1 TABLET TWICE DAILY 180 tablet 3  . Tiotropium Bromide-Olodaterol (STIOLTO RESPIMAT) 2.5-2.5 MCG/ACT AERS Inhale 2 puffs into the lungs daily. 4 g 11  . torsemide (DEMADEX) 20 MG tablet Take 3 tablets (60 mg total) by mouth 2 (two) times daily. 540 tablet 3   No current facility-administered medications on file prior to visit.    No Known Allergies  Past Medical History  Diagnosis Date  . Depression   . GERD (gastroesophageal reflux disease)   . Hyperlipidemia   . Hypertension   . PVD (peripheral vascular disease)   . CAD (coronary artery disease)   . Familial hematuria   . NIDDM (non-insulin dependent diabetes mellitus)     with neuropathy  . IBS (irritable bowel syndrome)   . Esophageal stricture   . Obesity, unspecified   . Nephrolithiasis   . Allergy   . Arthritis   . Pulmonary hypertension     Past Surgical History  Procedure Laterality Date  . Abdominal hysterectomy    . Cesarean section    . Carpal tunnel release    . Right heart catheterization N/A 12/05/2014    Procedure: RIGHT HEART CATH;  Surgeon: Sinclair Grooms, MD;  Location: [facility] CATH LAB;  Service: Cardiovascular;  Laterality: N/A;    Family History  Problem Relation Age of Onset  . Diabetes Mother   . Emphysema Paternal Grandfather   .  Allergies Sister   . Allergies Father   . Cancer Mother     ovarian,melanoma  . Cancer Father     lung  . Cancer Maternal Grandmother     uterine    Social History   Social History  . Marital Status: Married    Spouse Name: N/A  . Number of Children: 1  . Years of Education: N/A   Occupational History  . 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 Smoking status: Former Smoker -- 1.50 packs/day for 20 years    Types: Cigarettes    Quit date: 01/04/2009  . Smokeless tobacco: Never Used  . Alcohol Use: No     Comment: heavy in the past  . Drug Use: No  . Sexual Activity: No   Other Topics Concern  . Not on file    Social History Narrative   No living will   Son [name] should make decisions for her if she is unable.    Would accept resuscitation attempts but no prolonged ventilation   Not sure about tube feeds but probably wouldn't want them if cognitively unaware   Review of Systems Not eating well--food doesn't taste good Sleep is not great---still in recliner    Objective:   Physical Exam  Constitutional: She appears well-developed and well-nourished. No distress.  Neck: Normal range of motion. Neck supple. No thyromegaly present.  Cardiovascular: Normal rate and normal heart sounds.  Exam reveals no gallop.   No murmur heard. Pulmonary/Chest: Effort normal. No respiratory distress. She has no wheezes.  Dullness and decreased breath sounds just at right base  Abdominal: Soft. She exhibits no distension. There is no tenderness. There is no rebound and no guarding.  Musculoskeletal: She exhibits no edema.  Lymphadenopathy:    She has no cervical adenopathy.  Psychiatric: She has a normal mood and affect. Her behavior is normal.          Assessment & Plan:

## 2015-06-14 NOTE — Progress Notes (Signed)
Pre visit review using our clinic review tool, if applicable. No additional management support is needed unless otherwise documented below in the visit note.

## 2015-06-14 NOTE — Assessment & Plan Note (Signed)
Better with improved diuresis Abdomen much better Still seems to have right pleural effusion Probably fairly close to good dry weight Will follow with Dr Sung Amabile

## 2015-06-14 NOTE — Assessment & Plan Note (Signed)
Will recheck labs

## 2015-06-15 ENCOUNTER — Telehealth (HOSPITAL_COMMUNITY): Payer: Self-pay | Admitting: *Deleted

## 2015-06-15 NOTE — Telephone Encounter (Signed)
Patient called to let us know that her weight was up from discharge. She is having no symptoms. She feels find. Per DB note, it said to take Metolazone as needed for weight gain. I told her to go ahead and take her that and see if her weight went down. Told pt she could call us in the am if weight don't decrease.

## 2015-06-20 ENCOUNTER — Encounter (HOSPITAL_COMMUNITY): Payer: Commercial Managed Care - HMO

## 2015-06-21 ENCOUNTER — Telehealth (HOSPITAL_COMMUNITY): Payer: Self-pay | Admitting: *Deleted

## 2015-06-21 MED ORDER — METOLAZONE 2.5 MG PO TABS: 2.5000 mg | ORAL_TABLET | ORAL | Status: DC

## 2015-06-21 NOTE — Telephone Encounter (Signed)
Pt called this AM to report her wt continues to increase.  On 8/13 when she was d/c'd her wt was 145.2 lbs, yesterday it was 151 lb and today she is up to 154 lb.  She states it has been a gradual increase since being d/c'd, she called in last Thur 8/18 and was advised to take met 2.5 mg then which she did, however she didn't feel it really helped.  She did take 2.5 mg of met this AM.  Discussed all with Dr Bensimhon, he would like pt to take another 2.5 mg today and take 5 mg of met in the AM.  Pt is aware and agreeable, she also is sch for f/u appt with us tomorrow and will keep that appt, we also discussed salt and fluid intake. 

## 2015-06-22 ENCOUNTER — Ambulatory Visit (HOSPITAL_COMMUNITY)
Admit: 2015-06-22 | Discharge: 2015-06-22 | Disposition: A | Discharge status: Home / Self Care | Payer: Commercial Managed Care - HMO | Source: Ambulatory Visit | Attending: Cardiology | Admitting: Cardiology

## 2015-06-22 ENCOUNTER — Encounter (HOSPITAL_COMMUNITY): Payer: Self-pay

## 2015-06-22 VITALS — BP 120/70 | HR 94 | Wt 156.2 lb

## 2015-06-22 DIAGNOSIS — Z833 Family history of diabetes mellitus: Secondary | ICD-10-CM | POA: Insufficient documentation

## 2015-06-22 DIAGNOSIS — E785 Hyperlipidemia, unspecified: Secondary | ICD-10-CM | POA: Insufficient documentation

## 2015-06-22 DIAGNOSIS — I739 Peripheral vascular disease, unspecified: Secondary | ICD-10-CM | POA: Insufficient documentation

## 2015-06-22 DIAGNOSIS — I5032 Chronic diastolic (congestive) heart failure: Secondary | ICD-10-CM | POA: Insufficient documentation

## 2015-06-22 DIAGNOSIS — N189 Chronic kidney disease, unspecified: Secondary | ICD-10-CM | POA: Diagnosis not present

## 2015-06-22 DIAGNOSIS — N183 Chronic kidney disease, stage 3 unspecified: Secondary | ICD-10-CM

## 2015-06-22 DIAGNOSIS — Z7982 Long term (current) use of aspirin: Secondary | ICD-10-CM | POA: Insufficient documentation

## 2015-06-22 DIAGNOSIS — I2510 Atherosclerotic heart disease of native coronary artery without angina pectoris: Secondary | ICD-10-CM | POA: Insufficient documentation

## 2015-06-22 DIAGNOSIS — I129 Hypertensive chronic kidney disease with stage 1 through stage 4 chronic kidney disease, or unspecified chronic kidney disease: Secondary | ICD-10-CM | POA: Insufficient documentation

## 2015-06-22 DIAGNOSIS — J9610 Chronic respiratory failure, unspecified whether with hypoxia or hypercapnia: Secondary | ICD-10-CM | POA: Diagnosis not present

## 2015-06-22 DIAGNOSIS — Z87891 Personal history of nicotine dependence: Secondary | ICD-10-CM | POA: Insufficient documentation

## 2015-06-22 DIAGNOSIS — Z9981 Dependence on supplemental oxygen: Secondary | ICD-10-CM | POA: Diagnosis not present

## 2015-06-22 DIAGNOSIS — E1122 Type 2 diabetes mellitus with diabetic chronic kidney disease: Secondary | ICD-10-CM | POA: Diagnosis not present

## 2015-06-22 DIAGNOSIS — J9611 Chronic respiratory failure with hypoxia: Secondary | ICD-10-CM | POA: Diagnosis not present

## 2015-06-22 DIAGNOSIS — I272 Other secondary pulmonary hypertension: Secondary | ICD-10-CM | POA: Insufficient documentation

## 2015-06-22 DIAGNOSIS — E871 Hypo-osmolality and hyponatremia: Secondary | ICD-10-CM | POA: Insufficient documentation

## 2015-06-22 DIAGNOSIS — Z79899 Other long term (current) drug therapy: Secondary | ICD-10-CM | POA: Insufficient documentation

## 2015-06-22 DIAGNOSIS — I2781 Cor pulmonale (chronic): Secondary | ICD-10-CM | POA: Diagnosis not present

## 2015-06-22 DIAGNOSIS — E1140 Type 2 diabetes mellitus with diabetic neuropathy, unspecified: Secondary | ICD-10-CM | POA: Insufficient documentation

## 2015-06-22 DIAGNOSIS — K219 Gastro-esophageal reflux disease without esophagitis: Secondary | ICD-10-CM | POA: Insufficient documentation

## 2015-06-22 DIAGNOSIS — J449 Chronic obstructive pulmonary disease, unspecified: Secondary | ICD-10-CM | POA: Insufficient documentation

## 2015-06-22 LAB — BASIC METABOLIC PANEL
ANION GAP: 11 (ref 5–15)
BUN: 22 mg/dL — ABNORMAL HIGH (ref 6–20)
CALCIUM: 9.1 mg/dL (ref 8.9–10.3)
CO2: 36 mmol/L — AB (ref 22–32)
CREATININE: 1.74 mg/dL — AB (ref 0.44–1.00)
Chloride: 82 mmol/L — ABNORMAL LOW (ref 101–111)
GFR calc Af Amer: 35 mL/min — ABNORMAL LOW (ref >60)
GFR calc non Af Amer: 30 mL/min — ABNORMAL LOW (ref >60)
Glucose, Bld: 162 mg/dL — ABNORMAL HIGH (ref 65–99)
Potassium: 4.1 mmol/L (ref 3.5–5.1)
SODIUM: 129 mmol/L — AB (ref 135–145)

## 2015-06-22 NOTE — Progress Notes (Signed)
Patient ID: ANISSIA WESSELLS, female   DOB: 1956-01-19, 59 y.o.   MRN: [id number]  PCP: Dr Silvio Pate  Primary HF  Cardiologist: Dr Haroldine Laws Pulmonary: [name]   HPI: Ms Pompa is a 59 year old with history of HTN, DM, IBS, depression, GERD, severe COPD (FEV1 0.87L) and pulmonary hypertension with RV failure/cor pulmonale, and chronic diastolic heart failure. .  Admitted with volume overload. Diuresed with IV lasix and transtioned back torsemide and metolazone as needed. Discharge weight 153 pounds.   She returns for post hospital follow up. Weight at home 145-154 pounds. She has taken metolazone 8/18, 8/24, and 8/25. Poor urine output. Uses 2 liters [place] in the day time and 3 liters at bed time. Sleeps in the reclines due to back pain. Taking all medications. Limiting fluid intake to < 2 liters and following low salt diet. Poor appetite. Followed by [facility].   06/14/15 K 4.6 Creatinine 2.00   ROS: All systems negative except as listed in HPI, PMH and Problem List.  SH:  Social History   Social History  . Marital Status: Married    Spouse Name: N/A  . Number of Children: 1  . Years of Education: N/A   Occupational History  . 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 Smoking status: Former Smoker -- 1.50 packs/day for 20 years    Types: Cigarettes    Quit date: 01/04/2009  . Smokeless tobacco: Never Used  . Alcohol Use: No     Comment: heavy in the past  . Drug Use: No  . Sexual Activity: No   Other Topics Concern  . Not on file   Social History Narrative   No living will   Son [name] should make decisions for her if she is unable.    Would accept resuscitation attempts but no prolonged ventilation   Not sure about tube feeds but probably wouldn't want them if cognitively unaware    FH:  Family History  Problem Relation Age of Onset  . Diabetes Mother   . Emphysema Paternal Grandfather   . Allergies Sister   . Allergies Father   . Cancer  Mother     ovarian,melanoma  . Cancer Father     lung  . Cancer Maternal Grandmother     uterine    Past Medical History  Diagnosis Date  . Depression   . GERD (gastroesophageal reflux disease)   . Hyperlipidemia   . Hypertension   . PVD (peripheral vascular disease)   . CAD (coronary artery disease)   . Familial hematuria   . NIDDM (non-insulin dependent diabetes mellitus)     with neuropathy  . IBS (irritable bowel syndrome)   . Esophageal stricture   . Obesity, unspecified   . Nephrolithiasis   . Allergy   . Arthritis   . Pulmonary hypertension     Current Outpatient Prescriptions  Medication Sig Dispense Refill  . albuterol (PROVENTIL HFA;VENTOLIN HFA) 108 (90 BASE) MCG/ACT inhaler Inhale 2 puffs into the lungs every 6 (six) hours as needed for wheezing or shortness of breath. 1 Inhaler 2  . ALPRAZolam (XANAX) 0.25 MG tablet TAKE ONE (1) TABLET EACH DAY 90 tablet 0  . aspirin 325 MG tablet Take 325 mg by mouth daily.      Marland Kitchen gabapentin (NEURONTIN) 300 MG capsule Take 1 capsule (300 mg total) by mouth 2 (two) times daily. 1 capsule 0  . loratadine (CLARITIN) 10 MG tablet Take  10 mg by mouth 2 (two) times daily.     Marland Kitchen lovastatin (MEVACOR) 40 MG tablet TAKE 80 MG BY MOUTH DAILY AT BEDTIME 180 tablet 3  . metFORMIN (GLUCOPHAGE) 1000 MG tablet Take 500 mg by mouth 2 (two) times daily with a meal.    . methadone (DOLOPHINE) 10 MG tablet Take 2 tablets four times a day. (Patient taking differently: Take 20 mg by mouth 4 (four) times daily. Take 2 tablets four times a day.) 240 tablet 0  . metolazone (ZAROXOLYN) 2.5 MG tablet Take 1 tablet (2.5 mg total) by mouth as directed. 90 tablet 3  . metoprolol tartrate (LOPRESSOR) 25 MG tablet Take 0.5 tablets (12.5 mg total) by mouth 2 (two) times daily. 60 tablet 0  . mirtazapine (REMERON) 7.5 MG tablet Take 1 tablet (7.5 mg total) by mouth at bedtime. 30 tablet 0  . pantoprazole (PROTONIX) 40 MG tablet TAKE 1 TABLET TWICE DAILY 180  tablet 3  . potassium chloride SA (K-DUR,KLOR-CON) 20 MEQ tablet Take 20 mEq by mouth as needed. Take 1 (20 meq) tablet when you take metolazone    . Tiotropium Bromide-Olodaterol (STIOLTO RESPIMAT) 2.5-2.5 MCG/ACT AERS Inhale 2 puffs into the lungs daily. 4 g 11  . torsemide (DEMADEX) 20 MG tablet Take 3 tablets (60 mg total) by mouth 2 (two) times daily. 540 tablet 3   No current facility-administered medications for this encounter.    Filed Vitals:   06/22/15 1018  BP: 120/70  Pulse: 94  Weight: 156 lb 4 oz (70.875 kg)  SpO2: 94%    PHYSICAL EXAM:  General:  Chronically ill appearing. No resp difficulty. Mom present HEENT: normal Neck: supple. JVP 7-8. Carotids 2+ bilaterally; no bruits. No lymphadenopathy or thryomegaly appreciated. Cor: PMI normal. Regular rate & rhythm. No rubs, gallops or murmurs. Lungs: clear on 2 liters [place] Abdomen: obese, soft, nontender, nondistended. No hepatosplenomegaly. No bruits or masses. Good bowel sounds. Extremities: no cyanosis, clubbing, rash, edema Neuro: alert & orientedx3, cranial nerves grossly intact. Moves all 4 extremities w/o difficulty. Affect pleasant.    ASSESSMENT & PLAN: 1. Chronic Diastolic HF- NYHA III. Volume status on physical exam does not appear elevated despite weight gain. Dry weight seems to be 153-155 pounds. Continue torsemide 60 mg twice a day + metolazone as needed.  [facility] to continue to follow and will use the diuretic protocol.  Check BMET now.  2. PAH with cor pulmonale 3. Severe COPD- Followed by [name] 4. Chronic respiratory failure- on 2 liters nasal cannula .  5. Hyponatremia 6. CKD (baseline cr ~2.0)- Check BMET now.   Follow up in 3 weeks.   Jamison Soward NP-C  2:30 PM

## 2015-06-22 NOTE — Patient Instructions (Signed)
Routine lab work today. Will notify you of abnormal results, otherwise no news is good news!  Follow up 3 weeks.  Do the following things EVERYDAY: 1) Weigh yourself in the morning before breakfast. Write it down and keep it in a log. 2) Take your medicines as prescribed 3) Eat low salt foods-Limit salt (sodium) to 2000 mg per day.  4) Stay as active as you can everyday 5) Limit all fluids for the day to less than 2 liters

## 2015-06-26 ENCOUNTER — Other Ambulatory Visit: Payer: Self-pay | Admitting: Internal Medicine

## 2015-06-26 ENCOUNTER — Ambulatory Visit: Payer: Commercial Managed Care - HMO | Admitting: Internal Medicine

## 2015-06-27 ENCOUNTER — Ambulatory Visit: Payer: Commercial Managed Care - HMO | Admitting: Internal Medicine

## 2015-06-29 ENCOUNTER — Encounter (HOSPITAL_COMMUNITY): Payer: Commercial Managed Care - HMO

## 2015-06-30 ENCOUNTER — Other Ambulatory Visit: Payer: Self-pay | Admitting: *Deleted

## 2015-06-30 MED ORDER — METHADONE HCL 10 MG PO TABS: ORAL_TABLET | ORAL | Status: DC

## 2015-06-30 NOTE — Telephone Encounter (Signed)
Pt left voicemail at Triage. Pt is requesting refill of Rx Dr. Silvio Pate out of office, please advise

## 2015-06-30 NOTE — Telephone Encounter (Signed)
Called patient. Rx is ready for pick up. Left in front office.

## 2015-07-04 ENCOUNTER — Other Ambulatory Visit: Payer: Self-pay

## 2015-07-04 ENCOUNTER — Other Ambulatory Visit: Payer: Self-pay | Admitting: Internal Medicine

## 2015-07-04 NOTE — Telephone Encounter (Signed)
rx called into pharmacy

## 2015-07-04 NOTE — Patient Outreach (Signed)
[place] [facility]) Care Management  07/04/2015  Lindsay Harmon 1956/01/15 [id number]   Received call back from [name].  She was discharged from the hospital on 06-09-15, and is currently receiving home health services.  She has completed occupational therapy, but continues to be seen by a nurse and a physical therapist.  Patient would like to resume [facility] disease management calls when home health discharges her.  Encouraged her to call me as needed, and to let me know when she is discharged from home health.    Follow-up call scheduled for 07-27-15.  Candie Mile, RN, MSN [place] [phone] Fax [phone]

## 2015-07-04 NOTE — Telephone Encounter (Signed)
Approved: okay #[id number] x 0

## 2015-07-04 NOTE — Telephone Encounter (Signed)
03/31/15

## 2015-07-04 NOTE — Patient Outreach (Signed)
[place] [facility]) Care Management  07/04/2015  CONNEE IKNER 01-01-1956 [id number]   Unsuccessful attempt to contact patient by phone.  Message left requesting call back.  Candie Mile, RN, MSN [place] [phone] Fax [phone]

## 2015-07-05 NOTE — Patient Outreach (Signed)
[place] [facility]) Care Management  07/05/2015  ROSALENA MCCORRY 15-Feb-1956 [id number]   Request from Natividad Brood, RN to assign Community RN, assigned Merlene Morse Minor, Therapist, sports.  Thanks, Ronnell Freshwater. [place], [place] Assistant Phone: [phone] Fax: [phone]

## 2015-07-11 NOTE — Patient Outreach (Signed)
[place] [facility]) Care Management  07/11/2015  EKTA DANCER Feb 03, 1956 [id number]   Request from Natividad Brood, RN to assign Community RN, reassigned to Deloria Lair, NP for patient outreach.  Demyah Smyre L. Ohn Bostic, [place] Care Management Assistant

## 2015-07-12 ENCOUNTER — Other Ambulatory Visit: Payer: Self-pay | Admitting: Internal Medicine

## 2015-07-12 DIAGNOSIS — I5032 Chronic diastolic (congestive) heart failure: Secondary | ICD-10-CM

## 2015-07-13 ENCOUNTER — Ambulatory Visit (HOSPITAL_BASED_OUTPATIENT_CLINIC_OR_DEPARTMENT_OTHER)
Admission: RE | Admit: 2015-07-13 | Discharge: 2015-07-13 | Disposition: A | Discharge status: Home / Self Care | Payer: Commercial Managed Care - HMO | Source: Ambulatory Visit | Attending: Internal Medicine | Admitting: Internal Medicine

## 2015-07-13 ENCOUNTER — Ambulatory Visit (HOSPITAL_COMMUNITY)
Admission: RE | Admit: 2015-07-13 | Discharge: 2015-07-13 | Disposition: A | Discharge status: Home / Self Care | Payer: Commercial Managed Care - HMO | Source: Ambulatory Visit | Attending: Cardiology | Admitting: Cardiology

## 2015-07-13 VITALS — BP 138/70 | HR 100 | Wt 160.0 lb

## 2015-07-13 DIAGNOSIS — R600 Localized edema: Secondary | ICD-10-CM

## 2015-07-13 DIAGNOSIS — F329 Major depressive disorder, single episode, unspecified: Secondary | ICD-10-CM | POA: Diagnosis not present

## 2015-07-13 DIAGNOSIS — E785 Hyperlipidemia, unspecified: Secondary | ICD-10-CM | POA: Diagnosis not present

## 2015-07-13 DIAGNOSIS — M7989 Other specified soft tissue disorders: Secondary | ICD-10-CM | POA: Insufficient documentation

## 2015-07-13 DIAGNOSIS — I2510 Atherosclerotic heart disease of native coronary artery without angina pectoris: Secondary | ICD-10-CM | POA: Diagnosis not present

## 2015-07-13 DIAGNOSIS — J9610 Chronic respiratory failure, unspecified whether with hypoxia or hypercapnia: Secondary | ICD-10-CM | POA: Insufficient documentation

## 2015-07-13 DIAGNOSIS — N189 Chronic kidney disease, unspecified: Secondary | ICD-10-CM | POA: Diagnosis not present

## 2015-07-13 DIAGNOSIS — I129 Hypertensive chronic kidney disease with stage 1 through stage 4 chronic kidney disease, or unspecified chronic kidney disease: Secondary | ICD-10-CM | POA: Diagnosis not present

## 2015-07-13 DIAGNOSIS — Z79899 Other long term (current) drug therapy: Secondary | ICD-10-CM | POA: Diagnosis not present

## 2015-07-13 DIAGNOSIS — I739 Peripheral vascular disease, unspecified: Secondary | ICD-10-CM | POA: Diagnosis not present

## 2015-07-13 DIAGNOSIS — I2781 Cor pulmonale (chronic): Secondary | ICD-10-CM | POA: Diagnosis not present

## 2015-07-13 DIAGNOSIS — J449 Chronic obstructive pulmonary disease, unspecified: Secondary | ICD-10-CM | POA: Insufficient documentation

## 2015-07-13 DIAGNOSIS — I272 Pulmonary hypertension, unspecified: Secondary | ICD-10-CM

## 2015-07-13 DIAGNOSIS — Z833 Family history of diabetes mellitus: Secondary | ICD-10-CM | POA: Insufficient documentation

## 2015-07-13 DIAGNOSIS — Z9981 Dependence on supplemental oxygen: Secondary | ICD-10-CM | POA: Diagnosis not present

## 2015-07-13 DIAGNOSIS — K219 Gastro-esophageal reflux disease without esophagitis: Secondary | ICD-10-CM | POA: Insufficient documentation

## 2015-07-13 DIAGNOSIS — E1122 Type 2 diabetes mellitus with diabetic chronic kidney disease: Secondary | ICD-10-CM | POA: Insufficient documentation

## 2015-07-13 DIAGNOSIS — Z87891 Personal history of nicotine dependence: Secondary | ICD-10-CM | POA: Diagnosis not present

## 2015-07-13 DIAGNOSIS — Z7982 Long term (current) use of aspirin: Secondary | ICD-10-CM | POA: Insufficient documentation

## 2015-07-13 DIAGNOSIS — I5032 Chronic diastolic (congestive) heart failure: Secondary | ICD-10-CM | POA: Insufficient documentation

## 2015-07-13 DIAGNOSIS — E1140 Type 2 diabetes mellitus with diabetic neuropathy, unspecified: Secondary | ICD-10-CM | POA: Insufficient documentation

## 2015-07-13 DIAGNOSIS — I270 Primary pulmonary hypertension: Secondary | ICD-10-CM | POA: Diagnosis not present

## 2015-07-13 LAB — BASIC METABOLIC PANEL
Anion gap: 9 (ref 5–15)
BUN: 16 mg/dL (ref 6–20)
CHLORIDE: 85 mmol/L — AB (ref 101–111)
CO2: 32 mmol/L (ref 22–32)
Calcium: 9.4 mg/dL (ref 8.9–10.3)
Creatinine, Ser: 1.67 mg/dL — ABNORMAL HIGH (ref 0.44–1.00)
GFR calc Af Amer: 37 mL/min — ABNORMAL LOW (ref >60)
GFR calc non Af Amer: 32 mL/min — ABNORMAL LOW (ref >60)
GLUCOSE: 133 mg/dL — AB (ref 65–99)
POTASSIUM: 5.2 mmol/L — AB (ref 3.5–5.1)
Sodium: 126 mmol/L — ABNORMAL LOW (ref 135–145)

## 2015-07-13 LAB — BRAIN NATRIURETIC PEPTIDE: B Natriuretic Peptide: 88.2 pg/mL (ref 0.0–100.0)

## 2015-07-13 NOTE — Progress Notes (Signed)
ADVANCED HF CLINIC NOTE  Patient ID: Lindsay Harmon, female   DOB: Oct 17, 1956, 59 y.o.   MRN: [id number]  PCP: Dr Silvio Pate  Primary HF  Cardiologist: Dr Haroldine Laws Pulmonary: [name]   HPI: Lindsay Harmon is a 59 year old with history of HTN, DM, IBS, depression, GERD, severe COPD (FEV1 0.87L)  and pulmonary hypertension with RV failure/cor pulmonale, and chronic diastolic heart failure. .  Admitted in 8/16 with volume overload and severe hyponatremia. Diuresed with IV lasix and transtioned back torsemide and metolazone as needed. Discharge weight 153 pounds.   Seen several weeks ago for post-hospital f/u. Volume status was good. Dry weight felt to be 153-155.  Weight at home 145-154 pounds. She has taken metolazone 8/18, 8/24, and 8/25. Poor urine output. Uses 2 liters [place] in the day time and 3 liters at bed time. Sleeps in the reclines due to back pain. Taking all medications. Limiting fluid intake to < 2 liters and following low salt diet. Poor appetite. Followed by [facility].   RHC 12/05/2014 RA 12 mmHg (mean) with O2 sat 72%; RV 97/16 mmHg with O2 sat 73%; PA 97/30 mmHg with O2 sat 67%; PCWP(mean) 14 mmHg (mean); Cardiac Output 5.78 L/min  PFTs 2/92016 with severe COPD  FEV1 0.87 (38%) FVC 1.41 (48%) FEF 25-75 0.41 (19%) Unable to do DLCO  Echo (2/16) EF 60-65%, aortic sclerosis, RV moderately dilated/moderately decreased systolic function, PASP 71 mmHg  06/14/15 K 4.6 Creatinine 2.00   ROS: All systems negative except as listed in HPI, PMH and Problem List.  SH:  Social History   Social History  . Marital Status: Married    Spouse Name: N/A  . Number of Children: 1  . Years of Education: N/A   Occupational History  . 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 Smoking status: Former Smoker -- 1.50 packs/day for 20 years    Types: Cigarettes    Quit date: 01/04/2009  . Smokeless tobacco: Never Used  . Alcohol Use: No     Comment: heavy in the  past  . Drug Use: No  . Sexual Activity: No   Other Topics Concern  . Not on file   Social History Narrative   No living will   Son Lindsay Harmon should make decisions for her if she is unable.    Would accept resuscitation attempts but no prolonged ventilation   Not sure about tube feeds but probably wouldn't want them if cognitively unaware    FH:  Family History  Problem Relation Age of Onset  . Diabetes Mother   . Emphysema Paternal Grandfather   . Allergies Sister   . Allergies Father   . Cancer Mother     ovarian,melanoma  . Cancer Father     lung  . Cancer Maternal Grandmother     uterine    Past Medical History  Diagnosis Date  . Depression   . GERD (gastroesophageal reflux disease)   . Hyperlipidemia   . Hypertension   . PVD (peripheral vascular disease)   . CAD (coronary artery disease)   . Familial hematuria   . NIDDM (non-insulin dependent diabetes mellitus)     with neuropathy  . IBS (irritable bowel syndrome)   . Esophageal stricture   . Obesity, unspecified   . Nephrolithiasis   . Allergy   . Arthritis   . Pulmonary hypertension     Current Outpatient Prescriptions  Medication Sig Dispense Refill  .  albuterol (PROVENTIL HFA;VENTOLIN HFA) 108 (90 BASE) MCG/ACT inhaler Inhale 2 puffs into the lungs every 6 (six) hours as needed for wheezing or shortness of breath. 1 Inhaler 2  . ALPRAZolam (XANAX) 0.25 MG tablet Take 1 tablet (0.25 mg total) by mouth daily as needed. 90 tablet 0  . aspirin 325 MG tablet Take 325 mg by mouth daily.      Marland Kitchen gabapentin (NEURONTIN) 300 MG capsule Take 1 capsule (300 mg total) by mouth 2 (two) times daily. 1 capsule 0  . loratadine (CLARITIN) 10 MG tablet Take 10 mg by mouth 2 (two) times daily.     Marland Kitchen lovastatin (MEVACOR) 40 MG tablet TAKE 2 TABLETS EVERY DAY 180 tablet 3  . metFORMIN (GLUCOPHAGE) 1000 MG tablet TAKE 1/2 TABLET TWICE DAILY WITH A MEAL 90 tablet 3  . methadone (DOLOPHINE) 10 MG tablet Take 2 tablets four times  a day. 240 tablet 0  . metolazone (ZAROXOLYN) 2.5 MG tablet Take 1 tablet (2.5 mg total) by mouth as directed. 90 tablet 3  . metoprolol tartrate (LOPRESSOR) 25 MG tablet Take 0.5 tablets (12.5 mg total) by mouth 2 (two) times daily. 60 tablet 0  . mirtazapine (REMERON) 7.5 MG tablet Take 1 tablet (7.5 mg total) by mouth at bedtime. 30 tablet 0  . pantoprazole (PROTONIX) 40 MG tablet TAKE 1 TABLET TWICE DAILY 180 tablet 3  . potassium chloride SA (K-DUR,KLOR-CON) 20 MEQ tablet Take 20 mEq by mouth as needed. Take 1 (20 meq) tablet when you take metolazone    . Tiotropium Bromide-Olodaterol (STIOLTO RESPIMAT) 2.5-2.5 MCG/ACT AERS Inhale 2 puffs into the lungs daily. 4 g 11  . torsemide (DEMADEX) 20 MG tablet Take 3 tablets (60 mg total) by mouth 2 (two) times daily. 540 tablet 3   No current facility-administered medications for this encounter.    Filed Vitals:   07/13/15 1116  BP: 138/70  Pulse: 100  Weight: 160 lb (72.576 kg)  SpO2: 95%    PHYSICAL EXAM:  General:  Sitting in chair. Wearing O2 No resp difficulty.  HEENT: normal Neck: supple. JVP 6. Carotids 2+ bilaterally; no bruits. No lymphadenopathy or thryomegaly appreciated. Cor: PMI normal. Regular rate & rhythm. No rubs, gallops or murmurs. Lungs: clear on 2 liters [place] with markedly reduced BS throughout Abdomen: obese, soft, nontender, nondistended. No hepatosplenomegaly. No bruits or masses. Good bowel sounds. Extremities: no cyanosis, clubbing, rash, 1+ edema R>L  Neuro: alert & orientedx3, cranial nerves grossly intact. Moves all 4 extremities w/o difficulty. Affect pleasant.    ASSESSMENT & PLAN: 1. Chronic Diastolic HF- Improved. NYHA III. Volume status looks good. Dry weight seems to be 153-155 pounds. Continue torsemide 60 mg twice a day + metolazone as needed for weight 157 or greater. [facility] to continue to follow and will use the diuretic protocol.  Check BMET now.  2. PAH with cor pulmonale  - likely Who Group 3.  Not candidate for selective pulmonary vasodilators 3. Severe COPD- Followed by [name] 4. Chronic respiratory failure- on 2 liters nasal cannula .  5. Hyponatremia 6. CKD (baseline cr ~2.0)- Check BMET now.  7. RLE swelling and erythema - check u/s to exclude DVT  Follow up in 2 months  Marci Polito MD  11:43 AM

## 2015-07-13 NOTE — Progress Notes (Signed)
Preliminary report by tech  - Right Lower Ext. Venous Duplex - Negative for deep and superficial vein thrombosis in the right lower extremity. Oda Cogan, BS, RDMS, RVT

## 2015-07-13 NOTE — Patient Instructions (Signed)
Take Metolazone for weight of 157 lb or greater  Labs today  Your physician has requested that you have a lower extremity venous duplex. This test is an ultrasound of the veins in the legs or arms. It looks at venous blood flow that carries blood from the heart to the legs or arms. Allow one hour for a Lower Venous exam. Allow thirty minutes for an Upper Venous exam. There are no restrictions or special instructions.  Your physician recommends that you schedule a follow-up appointment in: 2 months with Dr Haroldine Laws

## 2015-07-17 ENCOUNTER — Other Ambulatory Visit: Payer: Self-pay | Admitting: *Deleted

## 2015-07-17 NOTE — Patient Outreach (Signed)
Pt reports her biggest health problem is her chronic back pain. She does see someone for pain management. Her pain level is tolerable at a level 4/10.  Pt reports she is stable as far as her HF. Her weight only varies a pound or two only. Avoids salt.  Blood glucose 128.   She has just gone to cardiology on 9/15. She will be seeing Dr. Payton Emerald for her HF.   She denies any health care needs that are not being met.  Deloria Lair [facility] [place] [phone]

## 2015-07-26 ENCOUNTER — Other Ambulatory Visit: Payer: Self-pay | Admitting: *Deleted

## 2015-07-26 ENCOUNTER — Ambulatory Visit (HOSPITAL_COMMUNITY)
Admission: RE | Admit: 2015-07-26 | Discharge: 2015-07-26 | Disposition: A | Discharge status: Home / Self Care | Payer: Commercial Managed Care - HMO | Source: Ambulatory Visit | Attending: Cardiology | Admitting: Cardiology

## 2015-07-26 DIAGNOSIS — I5033 Acute on chronic diastolic (congestive) heart failure: Secondary | ICD-10-CM | POA: Diagnosis not present

## 2015-07-26 DIAGNOSIS — I5022 Chronic systolic (congestive) heart failure: Secondary | ICD-10-CM | POA: Diagnosis not present

## 2015-07-26 LAB — BASIC METABOLIC PANEL
Anion gap: 11 (ref 5–15)
BUN: 21 mg/dL — AB (ref 6–20)
CALCIUM: 9.3 mg/dL (ref 8.9–10.3)
CO2: 36 mmol/L — AB (ref 22–32)
Chloride: 75 mmol/L — ABNORMAL LOW (ref 101–111)
Creatinine, Ser: 1.55 mg/dL — ABNORMAL HIGH (ref 0.44–1.00)
GFR calc non Af Amer: 35 mL/min — ABNORMAL LOW (ref >60)
GFR, EST AFRICAN AMERICAN: 41 mL/min — AB (ref >60)
GLUCOSE: 139 mg/dL — AB (ref 65–99)
Potassium: 3.7 mmol/L (ref 3.5–5.1)
Sodium: 122 mmol/L — ABNORMAL LOW (ref 135–145)

## 2015-07-26 NOTE — Patient Outreach (Signed)
Telephone outreach. Pt is still being seen by home health. She has been well educated and is following the medical recommendations. She has not signs of HF exacerbation.  I have given her my name and number if she has any questions or problems as an additional resource for her. I will call her again next week.  Deloria Lair [facility] [place] [phone]

## 2015-07-27 ENCOUNTER — Ambulatory Visit: Payer: Commercial Managed Care - HMO

## 2015-07-31 ENCOUNTER — Other Ambulatory Visit: Payer: Self-pay

## 2015-07-31 MED ORDER — METHADONE HCL 10 MG PO TABS: ORAL_TABLET | ORAL | Status: DC

## 2015-07-31 NOTE — Telephone Encounter (Signed)
Pt left v/m requesting rx methadone. Call when ready for pick up. Last printed # 240 on 06/30/15. Last seen 06/14/15.

## 2015-07-31 NOTE — Telephone Encounter (Signed)
Spoke with patient and advised rx ready for pick-up and it will be at the front desk.

## 2015-08-02 ENCOUNTER — Ambulatory Visit: Payer: Commercial Managed Care - HMO | Admitting: *Deleted

## 2015-08-04 ENCOUNTER — Other Ambulatory Visit: Payer: Self-pay | Admitting: *Deleted

## 2015-08-07 ENCOUNTER — Telehealth (HOSPITAL_COMMUNITY): Payer: Self-pay | Admitting: *Deleted

## 2015-08-07 ENCOUNTER — Ambulatory Visit: Payer: Commercial Managed Care - HMO | Admitting: *Deleted

## 2015-08-07 NOTE — Telephone Encounter (Signed)
error 

## 2015-08-09 ENCOUNTER — Other Ambulatory Visit: Payer: Self-pay | Admitting: *Deleted

## 2015-08-09 NOTE — Patient Outreach (Signed)
Pt is doing very well. She has been evaluated by Dr. Belenda Cruise team and will be followed up again in December. She will see her pulmonologist in November. She feels comfortable with her daily HF management regimen. She is weighing every day, takes her meds, tries to get some walking in and she follows a low sodium diet. She feels like she has a good handle on this and doesn't require further telephone assistance although she has enjoyed talking with [place].  I have assured she has my number and the 24 hour nurse line. I have emphasized how important it is to call if she feels she has any problems or questions to avoid complications.  Deloria Lair [facility] [place] [phone]

## 2015-08-10 ENCOUNTER — Ambulatory Visit (INDEPENDENT_AMBULATORY_CARE_PROVIDER_SITE_OTHER): Payer: Commercial Managed Care - HMO

## 2015-08-10 ENCOUNTER — Other Ambulatory Visit (INDEPENDENT_AMBULATORY_CARE_PROVIDER_SITE_OTHER): Payer: Commercial Managed Care - HMO

## 2015-08-10 DIAGNOSIS — Z23 Encounter for immunization: Secondary | ICD-10-CM | POA: Diagnosis not present

## 2015-08-10 DIAGNOSIS — I5021 Acute systolic (congestive) heart failure: Secondary | ICD-10-CM | POA: Diagnosis not present

## 2015-08-10 LAB — BASIC METABOLIC PANEL
BUN: 28 mg/dL — ABNORMAL HIGH (ref 6–23)
CALCIUM: 9.4 mg/dL (ref 8.4–10.5)
CO2: 38 meq/L — AB (ref 19–32)
Chloride: 76 mEq/L — ABNORMAL LOW (ref 96–112)
Creatinine, Ser: 1.95 mg/dL — ABNORMAL HIGH (ref 0.40–1.20)
GFR: 27.66 mL/min — ABNORMAL LOW (ref >60.00)
GLUCOSE: 190 mg/dL — AB (ref 70–99)
Potassium: 3.4 mEq/L — ABNORMAL LOW (ref 3.5–5.1)
SODIUM: 125 meq/L — AB (ref 135–145)

## 2015-08-22 LAB — HM DIABETES EYE EXAM

## 2015-08-24 ENCOUNTER — Encounter: Payer: Self-pay | Admitting: Internal Medicine

## 2015-08-31 ENCOUNTER — Other Ambulatory Visit: Payer: Self-pay

## 2015-08-31 MED ORDER — METHADONE HCL 10 MG PO TABS: ORAL_TABLET | ORAL | Status: DC

## 2015-08-31 NOTE — Telephone Encounter (Signed)
Pt left v/m requesting rx methadone. Call when ready for pick up. rx last printed # 240 on 07/31/15. Pt last seen f/u on 06/14/15.

## 2015-09-04 MED ORDER — METHADONE HCL 10 MG PO TABS: 20.0000 mg | ORAL_TABLET | Freq: Four times a day (QID) | ORAL | Status: DC

## 2015-09-04 MED ORDER — METHADONE HCL 10 MG PO TABS: ORAL_TABLET | ORAL | Status: DC

## 2015-09-04 NOTE — Telephone Encounter (Signed)
If never picked up, I will just refill it--- must have been misplaced

## 2015-09-04 NOTE — Telephone Encounter (Signed)
Spoke with patient and advised rx ready for pick-up and it will be at the front desk.  

## 2015-09-04 NOTE — Telephone Encounter (Signed)
Spoke with patient because I wasn't sure if this had been done and she did not pick-up her prescription and it looked like this was printed out and I can't find it. Please advise

## 2015-09-07 ENCOUNTER — Encounter: Payer: Self-pay | Admitting: Pulmonary Disease

## 2015-09-07 ENCOUNTER — Ambulatory Visit (INDEPENDENT_AMBULATORY_CARE_PROVIDER_SITE_OTHER): Payer: Commercial Managed Care - HMO | Admitting: Pulmonary Disease

## 2015-09-07 VITALS — BP 116/64 | HR 89 | Ht 61.0 in | Wt 160.0 lb

## 2015-09-07 DIAGNOSIS — J9611 Chronic respiratory failure with hypoxia: Secondary | ICD-10-CM | POA: Diagnosis not present

## 2015-09-07 DIAGNOSIS — J439 Emphysema, unspecified: Secondary | ICD-10-CM

## 2015-09-07 NOTE — Assessment & Plan Note (Signed)
This has been a stable interval despite her severe COPD. She has not had an exacerbation.  Flu shot is up-to-date. She has had both forms of the pneumonia vaccine. Today we discussed infection prevention measures.  Plan: Continue Stiolto as prescribed Continue to use oxygen as prescribed Follow-up 6 months or sooner if needed

## 2015-09-07 NOTE — Progress Notes (Signed)
Subjective:    Patient ID: Lindsay Harmon, female    DOB: Apr 12, 1956, 59 y.o.   MRN: [id number]   Synopsis: Referred by cardiology in 2016 for evaluation of severe COPD and possible obstructive sleep apnea in the setting of pulmonary hypertension. She also has congestive heart failure. A sleep study in 2016 showed no evidence of obstructive sleep apnea but she does have significant nocturnal hypoxemia. Spirometry testing in 2016 confirmed a diagnosis of COPD with severe airflow obstruction FEV1 41% pred.  HPI Chief Complaint  Patient presents with  . Follow-up    pt has no complaints at this time regarding her breathing.  CAT score 9.   She has been stable lately.  Only miniaml short of breath with exertion, but not worse than the last time. No significant cough lately. She was hospitalized for an exacerbation of congestive heart failure in August. She says that she remains active. She continues to take [employer]. She has not been treated for an exacerbation of COPD to her knowledge.  Past Medical History  Diagnosis Date  . Depression   . GERD (gastroesophageal reflux disease)   . Hyperlipidemia   . Hypertension   . PVD (peripheral vascular disease) ([place])   . CAD (coronary artery disease)   . Familial hematuria   . NIDDM (non-insulin dependent diabetes mellitus)     with neuropathy  . IBS (irritable bowel syndrome)   . Esophageal stricture   . Obesity, unspecified   . Nephrolithiasis   . Allergy   . Arthritis   . Pulmonary hypertension ([place])       Review of Systems  Constitutional: Positive for fatigue. Negative for fever and diaphoresis.  HENT: Negative for postnasal drip, rhinorrhea and sinus pressure.   Respiratory: Negative for cough, shortness of breath and wheezing.   Cardiovascular: Negative for chest pain, palpitations and leg swelling.       Objective:   Physical Exam  Filed Vitals:   09/07/15 1213  BP: 116/64  Pulse: 89  Height: _0  (1.549 m)    Weight: 160 lb (72.576 kg)  SpO2: 91%  2L [place]  Gen: chronically ill appearing HENT: OP clear, TM's clear, neck supple PULM: few crackles bases, normal effort CV: RRR, no mgr, trace edema GI: BS+, soft, nontender Derm: no cyanosis or rash Psyche: normal mood and affect      Assessment & Plan:   COPD (chronic obstructive pulmonary disease) (HCC) This has been a stable interval despite her severe COPD. She has not had an exacerbation.  Flu shot is up-to-date. She has had both forms of the pneumonia vaccine. Today we discussed infection prevention measures.  Plan: Continue Stiolto as prescribed Continue to use oxygen as prescribed Follow-up 6 months or sooner if needed  Chronic respiratory failure with hypoxia (HCC) Continue 2 L at rest, 3 L with exertion and 3 L daily at bedtime.    Updated Medication List Outpatient Encounter Prescriptions as of 09/07/2015  Medication Sig  . albuterol (PROVENTIL HFA;VENTOLIN HFA) 108 (90 BASE) MCG/ACT inhaler Inhale 2 puffs into the lungs every 6 (six) hours as needed for wheezing or shortness of breath.  . ALPRAZolam (XANAX) 0.25 MG tablet Take 1 tablet (0.25 mg total) by mouth daily as needed.  Marland Kitchen aspirin 325 MG tablet Take 325 mg by mouth daily.    Marland Kitchen gabapentin (NEURONTIN) 300 MG capsule Take 1 capsule (300 mg total) by mouth 2 (two) times daily. (Patient taking differently: Take 300 mg by mouth 4 (  four) times daily. )  . loratadine (CLARITIN) 10 MG tablet Take 10 mg by mouth 2 (two) times daily.   Marland Kitchen lovastatin (MEVACOR) 40 MG tablet TAKE 2 TABLETS EVERY DAY  . metFORMIN (GLUCOPHAGE) 1000 MG tablet TAKE 1/2 TABLET TWICE DAILY WITH A MEAL  . methadone (DOLOPHINE) 10 MG tablet Take 2 tablets (20 mg total) by mouth 4 (four) times daily.  . metolazone (ZAROXOLYN) 2.5 MG tablet Take 1 tablet (2.5 mg total) by mouth as directed.  . metoprolol tartrate (LOPRESSOR) 25 MG tablet Take 0.5 tablets (12.5 mg total) by mouth 2 (two) times daily.  .  mirtazapine (REMERON) 7.5 MG tablet Take 1 tablet (7.5 mg total) by mouth at bedtime. (Patient taking differently: Take 3.25 mg by mouth at bedtime. )  . pantoprazole (PROTONIX) 40 MG tablet TAKE 1 TABLET TWICE DAILY  . potassium chloride SA (K-DUR,KLOR-CON) 20 MEQ tablet Take 20 mEq by mouth as needed. Take 1 (20 meq) tablet when you take metolazone  . Tiotropium Bromide-Olodaterol (STIOLTO RESPIMAT) 2.5-2.5 MCG/ACT AERS Inhale 2 puffs into the lungs daily.  Marland Kitchen torsemide (DEMADEX) 20 MG tablet Take 3 tablets (60 mg total) by mouth 2 (two) times daily.   No facility-administered encounter medications on file as of 09/07/2015.

## 2015-09-07 NOTE — Assessment & Plan Note (Signed)
Continue 2 L at rest, 3 L with exertion and 3 L daily at bedtime.

## 2015-09-07 NOTE — Patient Instructions (Signed)
Keep taking your medications as you're doing Keep using her oxygen when he exerts her self We will see you back in 6 months or sooner if needed

## 2015-09-29 ENCOUNTER — Other Ambulatory Visit: Payer: Self-pay | Admitting: Internal Medicine

## 2015-09-29 NOTE — Telephone Encounter (Signed)
07/04/2015

## 2015-09-29 NOTE — Telephone Encounter (Signed)
Approved: #[phone] x 0

## 2015-09-29 NOTE — Telephone Encounter (Signed)
rx called into pharmacy

## 2015-10-05 ENCOUNTER — Other Ambulatory Visit: Payer: Self-pay | Admitting: Internal Medicine

## 2015-10-10 ENCOUNTER — Other Ambulatory Visit: Payer: Self-pay

## 2015-10-10 MED ORDER — METHADONE HCL 10 MG PO TABS: 20.0000 mg | ORAL_TABLET | Freq: Four times a day (QID) | ORAL | Status: DC

## 2015-10-10 NOTE — Telephone Encounter (Signed)
Pt left v/m requesting rx methadone. Call when ready for pick up . Last printed # 240 on 09/04/15. Last seen 06/14/15.

## 2015-10-10 NOTE — Telephone Encounter (Signed)
Spoke with patient and advised rx ready for pick-up and it will be at the front desk.

## 2015-10-18 ENCOUNTER — Encounter: Payer: Self-pay | Admitting: Internal Medicine

## 2015-10-18 ENCOUNTER — Telehealth: Payer: Self-pay | Admitting: *Deleted

## 2015-10-18 ENCOUNTER — Other Ambulatory Visit: Payer: Self-pay | Admitting: Internal Medicine

## 2015-10-18 ENCOUNTER — Ambulatory Visit (INDEPENDENT_AMBULATORY_CARE_PROVIDER_SITE_OTHER): Payer: Commercial Managed Care - HMO | Admitting: Internal Medicine

## 2015-10-18 VITALS — BP 130/60 | HR 87 | Temp 98.4°F | Wt 159.0 lb

## 2015-10-18 DIAGNOSIS — N184 Chronic kidney disease, stage 4 (severe): Secondary | ICD-10-CM

## 2015-10-18 DIAGNOSIS — I5032 Chronic diastolic (congestive) heart failure: Secondary | ICD-10-CM | POA: Diagnosis not present

## 2015-10-18 DIAGNOSIS — E871 Hypo-osmolality and hyponatremia: Secondary | ICD-10-CM

## 2015-10-18 DIAGNOSIS — E1142 Type 2 diabetes mellitus with diabetic polyneuropathy: Secondary | ICD-10-CM

## 2015-10-18 DIAGNOSIS — E1151 Type 2 diabetes mellitus with diabetic peripheral angiopathy without gangrene: Secondary | ICD-10-CM | POA: Diagnosis not present

## 2015-10-18 LAB — HEMOGLOBIN A1C: Hgb A1c MFr Bld: 8.3 % — ABNORMAL HIGH (ref 4.6–6.5)

## 2015-10-18 LAB — RENAL FUNCTION PANEL
Albumin: 4.3 g/dL (ref 3.5–5.2)
BUN: 33 mg/dL — ABNORMAL HIGH (ref 6–23)
CO2: 38 mEq/L — ABNORMAL HIGH (ref 19–32)
Calcium: 9.7 mg/dL (ref 8.4–10.5)
Chloride: 65 mEq/L — ABNORMAL LOW (ref 96–112)
Creatinine, Ser: 1.77 mg/dL — ABNORMAL HIGH (ref 0.40–1.20)
GFR: 30.91 mL/min — ABNORMAL LOW (ref >60.00)
GLUCOSE: 186 mg/dL — AB (ref 70–99)
POTASSIUM: 3.4 meq/L — AB (ref 3.5–5.1)
Phosphorus: 3.1 mg/dL (ref 2.3–4.6)
Sodium: 113 mEq/L — CL (ref 135–145)

## 2015-10-18 NOTE — Assessment & Plan Note (Signed)
Probably still has acceptable control PVD (unlikely cause of calf pain) and neuropathy persist but no sig change

## 2015-10-18 NOTE — Assessment & Plan Note (Signed)
Hopefully has improved since last check

## 2015-10-18 NOTE — Telephone Encounter (Signed)
Called both numbers, left message on cell phone VM asking pt to return my call, home phone just rang, no answer, no VM.

## 2015-10-18 NOTE — Progress Notes (Signed)
Subjective:    Patient ID: Lindsay Harmon, female    DOB: 25-Oct-1956, 59 y.o.   MRN: [id number]  HPI Here for follow up of CHF and other medical conditions  Really getting more stable Walks independently Drives--does all instrumental ADLs  Still goes to heart failure clinic Weighs daily--- fairly stable Low sodium diet Had phone follow up--now stopped Breathing is good Sleeps in chair still--for a long time. No PND Tolerates her housework--no other exercise  Sugars are running up 110-200 fasting Most are 130-150. This is higher than her usual No hypoglycemic reactions Feet still numb--does have sore spots on both calves (not feet)  No chest pain No palpitations No dizziness or syncope  Current Outpatient Prescriptions on File Prior to Visit  Medication Sig Dispense Refill  . ACCU-CHEK AVIVA PLUS test strip USE 1 STRIP DAILY TO CHECK BLOOD SUGAR 100 each 1  . albuterol (PROVENTIL HFA;VENTOLIN HFA) 108 (90 BASE) MCG/ACT inhaler Inhale 2 puffs into the lungs every 6 (six) hours as needed for wheezing or shortness of breath. 1 Inhaler 2  . ALPRAZolam (XANAX) 0.25 MG tablet TAKE ONE (1) TABLET EACH DAY 90 tablet 0  . aspirin 325 MG tablet Take 325 mg by mouth daily.      Marland Kitchen glucose blood (ACCU-CHEK AVIVA PLUS) test strip Use to check blood sugar once daily Dx: E11.49 100 each 1  . loratadine (CLARITIN) 10 MG tablet Take 10 mg by mouth 2 (two) times daily.     Marland Kitchen lovastatin (MEVACOR) 40 MG tablet TAKE 2 TABLETS EVERY DAY 180 tablet 3  . metFORMIN (GLUCOPHAGE) 1000 MG tablet TAKE 1/2 TABLET TWICE DAILY WITH A MEAL 90 tablet 3  . methadone (DOLOPHINE) 10 MG tablet Take 2 tablets (20 mg total) by mouth 4 (four) times daily. 240 tablet 0  . metolazone (ZAROXOLYN) 2.5 MG tablet Take 1 tablet (2.5 mg total) by mouth as directed. 90 tablet 3  . metoprolol tartrate (LOPRESSOR) 25 MG tablet TAKE 1 TABLET TWICE DAILY 180 tablet 3  . mirtazapine (REMERON) 7.5 MG tablet Take 1 tablet (7.5 mg  total) by mouth at bedtime. (Patient taking differently: Take 3.25 mg by mouth at bedtime. ) 30 tablet 0  . pantoprazole (PROTONIX) 40 MG tablet TAKE 1 TABLET TWICE DAILY 180 tablet 3  . potassium chloride SA (K-DUR,KLOR-CON) 20 MEQ tablet Take 20 mEq by mouth as needed. Take 1 (20 meq) tablet when you take metolazone    . Tiotropium Bromide-Olodaterol (STIOLTO RESPIMAT) 2.5-2.5 MCG/ACT AERS Inhale 2 puffs into the lungs daily. 4 g 11  . torsemide (DEMADEX) 20 MG tablet Take 3 tablets (60 mg total) by mouth 2 (two) times daily. 540 tablet 3   No current facility-administered medications on file prior to visit.    No Known Allergies  Past Medical History  Diagnosis Date  . Depression   . GERD (gastroesophageal reflux disease)   . Hyperlipidemia   . Hypertension   . PVD (peripheral vascular disease) ([place])   . CAD (coronary artery disease)   . Familial hematuria   . NIDDM (non-insulin dependent diabetes mellitus)     with neuropathy  . IBS (irritable bowel syndrome)   . Esophageal stricture   . Obesity, unspecified   . Nephrolithiasis   . Allergy   . Arthritis   . Pulmonary hypertension [facility])     Past Surgical History  Procedure Laterality Date  . Abdominal hysterectomy    . Cesarean section    . Carpal tunnel  release    . Right heart catheterization N/A 12/05/2014    Procedure: RIGHT HEART CATH;  Surgeon: Sinclair Grooms, MD;  Location: [facility] C[facility] LAB;  Service: Cardiovascular;  Laterality: N/A;    Family History  Problem Relation Age of Onset  . Diabetes Mother   . Emphysema Paternal Grandfather   . Allergies Sister   . Allergies Father   . Cancer Mother     ovarian,melanoma  . Cancer Father     lung  . Cancer Maternal Grandmother     uterine    Social History   Social History  . Marital Status: Married    Spouse Name: N/A  . Number of Children: 1  . Years of Education: N/A   Occupational History  . 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 Smoking status: Former Smoker -- 1.50 packs/day for 20 years    Types: Cigarettes    Quit date: 01/04/2009  . Smokeless tobacco: Never Used  . Alcohol Use: No     Comment: heavy in the past  . Drug Use: No  . Sexual Activity: No   Other Topics Concern  . Not on file   Social History Narrative   No living will   Son [name] should make decisions for her if she is unable.    Would accept resuscitation attempts but no prolonged ventilation   Not sure about tube feeds but probably wouldn't want them if cognitively unaware   Review of Systems Normal appetite Sleeps fair---her normal (never a great sleeper) Bowels are okay Wears oxygen 24/7 (other than in shower)    Objective:   Physical Exam  Constitutional: She appears well-developed and well-nourished. No distress.  Neck: Normal range of motion. No thyromegaly present.  Cardiovascular: Normal rate, regular rhythm and normal heart sounds.  Exam reveals no gallop.   No murmur heard. Faint pulse right foot--absent on left  Pulmonary/Chest: Effort normal. No respiratory distress. She has no wheezes. She has no rales.  Slight decreased breath sounds but clear No dullness  Musculoskeletal:  Slight edema   Lymphadenopathy:    She has no cervical adenopathy.  Skin:  Venous stasis changes No ulcers  Psychiatric: She has a normal mood and affect. Her behavior is normal.          Assessment & Plan:

## 2015-10-18 NOTE — Telephone Encounter (Signed)
Received call with critical lab result from [facility] lab. Sodium was 113. Test was repeated x 2.

## 2015-10-18 NOTE — Assessment & Plan Note (Signed)
About the same No ulcers Pain in calves could be from this

## 2015-10-18 NOTE — Telephone Encounter (Signed)
Please call her Her sodium is very low-- I would like to set her up emergently with a nephrologist Has she seen anyone (kidney specialist) before

## 2015-10-18 NOTE — Assessment & Plan Note (Signed)
Compensated now Has regular CHF follow up

## 2015-10-19 ENCOUNTER — Inpatient Hospital Stay
Admission: AD | Admit: 2015-10-19 | Discharge: 2015-10-20 | DRG: 641 | Disposition: A | Discharge status: Home / Self Care | Payer: Commercial Managed Care - HMO | Source: Ambulatory Visit | Attending: Internal Medicine | Admitting: Internal Medicine

## 2015-10-19 DIAGNOSIS — Z7984 Long term (current) use of oral hypoglycemic drugs: Secondary | ICD-10-CM | POA: Diagnosis not present

## 2015-10-19 DIAGNOSIS — F329 Major depressive disorder, single episode, unspecified: Secondary | ICD-10-CM | POA: Diagnosis present

## 2015-10-19 DIAGNOSIS — N184 Chronic kidney disease, stage 4 (severe): Secondary | ICD-10-CM | POA: Diagnosis present

## 2015-10-19 DIAGNOSIS — Z79899 Other long term (current) drug therapy: Secondary | ICD-10-CM

## 2015-10-19 DIAGNOSIS — E1122 Type 2 diabetes mellitus with diabetic chronic kidney disease: Secondary | ICD-10-CM | POA: Diagnosis present

## 2015-10-19 DIAGNOSIS — Z9981 Dependence on supplemental oxygen: Secondary | ICD-10-CM | POA: Diagnosis not present

## 2015-10-19 DIAGNOSIS — I739 Peripheral vascular disease, unspecified: Secondary | ICD-10-CM | POA: Diagnosis present

## 2015-10-19 DIAGNOSIS — E669 Obesity, unspecified: Secondary | ICD-10-CM | POA: Diagnosis present

## 2015-10-19 DIAGNOSIS — I5030 Unspecified diastolic (congestive) heart failure: Secondary | ICD-10-CM | POA: Diagnosis present

## 2015-10-19 DIAGNOSIS — K222 Esophageal obstruction: Secondary | ICD-10-CM | POA: Diagnosis present

## 2015-10-19 DIAGNOSIS — I2510 Atherosclerotic heart disease of native coronary artery without angina pectoris: Secondary | ICD-10-CM | POA: Diagnosis present

## 2015-10-19 DIAGNOSIS — E785 Hyperlipidemia, unspecified: Secondary | ICD-10-CM | POA: Diagnosis present

## 2015-10-19 DIAGNOSIS — Z87891 Personal history of nicotine dependence: Secondary | ICD-10-CM

## 2015-10-19 DIAGNOSIS — J9610 Chronic respiratory failure, unspecified whether with hypoxia or hypercapnia: Secondary | ICD-10-CM | POA: Diagnosis present

## 2015-10-19 DIAGNOSIS — I272 Other secondary pulmonary hypertension: Secondary | ICD-10-CM | POA: Diagnosis present

## 2015-10-19 DIAGNOSIS — I130 Hypertensive heart and chronic kidney disease with heart failure and stage 1 through stage 4 chronic kidney disease, or unspecified chronic kidney disease: Secondary | ICD-10-CM | POA: Diagnosis present

## 2015-10-19 DIAGNOSIS — G8929 Other chronic pain: Secondary | ICD-10-CM | POA: Diagnosis present

## 2015-10-19 DIAGNOSIS — K219 Gastro-esophageal reflux disease without esophagitis: Secondary | ICD-10-CM | POA: Diagnosis present

## 2015-10-19 DIAGNOSIS — Z6829 Body mass index (BMI) 29.0-29.9, adult: Secondary | ICD-10-CM | POA: Diagnosis not present

## 2015-10-19 DIAGNOSIS — M549 Dorsalgia, unspecified: Secondary | ICD-10-CM | POA: Diagnosis present

## 2015-10-19 DIAGNOSIS — J449 Chronic obstructive pulmonary disease, unspecified: Secondary | ICD-10-CM | POA: Diagnosis present

## 2015-10-19 DIAGNOSIS — K589 Irritable bowel syndrome without diarrhea: Secondary | ICD-10-CM | POA: Diagnosis present

## 2015-10-19 DIAGNOSIS — E1140 Type 2 diabetes mellitus with diabetic neuropathy, unspecified: Secondary | ICD-10-CM | POA: Diagnosis present

## 2015-10-19 DIAGNOSIS — E871 Hypo-osmolality and hyponatremia: Principal | ICD-10-CM | POA: Diagnosis present

## 2015-10-19 LAB — MAGNESIUM: Magnesium: 1.7 mg/dL (ref 1.7–2.4)

## 2015-10-19 LAB — GLUCOSE, CAPILLARY
Glucose-Capillary: 221 mg/dL — ABNORMAL HIGH (ref 65–99)
Glucose-Capillary: 169 mg/dL — ABNORMAL HIGH (ref 65–99)

## 2015-10-19 LAB — SODIUM
Sodium: 117 mmol/L — CL (ref 135–145)
Sodium: 119 mmol/L — CL (ref 135–145)

## 2015-10-19 MED ORDER — ONDANSETRON HCL 4 MG PO TABS: 4.0000 mg | ORAL_TABLET | Freq: Four times a day (QID) | ORAL | Status: DC | PRN

## 2015-10-19 MED ORDER — MIRTAZAPINE 15 MG PO TABS
7.5000 mg | ORAL_TABLET | Freq: Every day | ORAL | Status: DC
  Administered 2015-10-19: 7.5 mg via ORAL
  Filled 2015-10-19: qty 1

## 2015-10-19 MED ORDER — METOPROLOL TARTRATE 25 MG PO TABS
25.0000 mg | ORAL_TABLET | Freq: Two times a day (BID) | ORAL | Status: DC
  Administered 2015-10-19 – 2015-10-20 (×2): 25 mg via ORAL
  Filled 2015-10-19 (×2): qty 1

## 2015-10-19 MED ORDER — DOCUSATE SODIUM 100 MG PO CAPS
100.0000 mg | ORAL_CAPSULE | Freq: Two times a day (BID) | ORAL | Status: DC
  Administered 2015-10-19 – 2015-10-20 (×2): 100 mg via ORAL
  Filled 2015-10-19 (×2): qty 1

## 2015-10-19 MED ORDER — PANTOPRAZOLE SODIUM 40 MG PO TBEC
40.0000 mg | DELAYED_RELEASE_TABLET | Freq: Two times a day (BID) | ORAL | Status: DC
  Administered 2015-10-19 – 2015-10-20 (×2): 40 mg via ORAL
  Filled 2015-10-19 (×2): qty 1

## 2015-10-19 MED ORDER — TIOTROPIUM BROMIDE MONOHYDRATE 18 MCG IN CAPS
18.0000 ug | ORAL_CAPSULE | Freq: Every day | RESPIRATORY_TRACT | Status: DC
  Filled 2015-10-19: qty 5

## 2015-10-19 MED ORDER — GABAPENTIN 300 MG PO CAPS
300.0000 mg | ORAL_CAPSULE | Freq: Four times a day (QID) | ORAL | Status: DC
  Administered 2015-10-19 – 2015-10-20 (×3): 300 mg via ORAL
  Filled 2015-10-19 (×3): qty 1

## 2015-10-19 MED ORDER — INSULIN ASPART 100 UNIT/ML ~~LOC~~ SOLN
0.0000 [IU] | Freq: Three times a day (TID) | SUBCUTANEOUS | Status: DC
  Administered 2015-10-19: 3 [IU] via SUBCUTANEOUS
  Administered 2015-10-20: 2 [IU] via SUBCUTANEOUS
  Filled 2015-10-19: qty 2
  Filled 2015-10-19: qty 3

## 2015-10-19 MED ORDER — ALBUTEROL SULFATE (2.5 MG/3ML) 0.083% IN NEBU: 2.5000 mg | INHALATION_SOLUTION | Freq: Four times a day (QID) | RESPIRATORY_TRACT | Status: DC | PRN

## 2015-10-19 MED ORDER — TIOTROPIUM BROMIDE-OLODATEROL 2.5-2.5 MCG/ACT IN AERS: 2.0000 | INHALATION_SPRAY | Freq: Every day | RESPIRATORY_TRACT | Status: DC

## 2015-10-19 MED ORDER — ALPRAZOLAM 0.25 MG PO TABS: 0.2500 mg | ORAL_TABLET | Freq: Two times a day (BID) | ORAL | Status: DC | PRN

## 2015-10-19 MED ORDER — HEPARIN SODIUM (PORCINE) 5000 UNIT/ML IJ SOLN
5000.0000 [IU] | Freq: Three times a day (TID) | INTRAMUSCULAR | Status: DC
  Administered 2015-10-19 – 2015-10-20 (×3): 5000 [IU] via SUBCUTANEOUS
  Filled 2015-10-19 (×3): qty 1

## 2015-10-19 MED ORDER — SALMETEROL XINAFOATE 50 MCG/DOSE IN AEPB
1.0000 | INHALATION_SPRAY | Freq: Two times a day (BID) | RESPIRATORY_TRACT | Status: DC
  Administered 2015-10-19: 1 via RESPIRATORY_TRACT
  Filled 2015-10-19: qty 0

## 2015-10-19 MED ORDER — SODIUM CHLORIDE 0.9 % IJ SOLN
3.0000 mL | Freq: Two times a day (BID) | INTRAMUSCULAR | Status: DC
  Administered 2015-10-19: 3 mL via INTRAVENOUS

## 2015-10-19 MED ORDER — POTASSIUM CHLORIDE CRYS ER 20 MEQ PO TBCR: 20.0000 meq | EXTENDED_RELEASE_TABLET | ORAL | Status: DC

## 2015-10-19 MED ORDER — METHADONE HCL 10 MG PO TABS
20.0000 mg | ORAL_TABLET | Freq: Four times a day (QID) | ORAL | Status: DC
  Administered 2015-10-19 – 2015-10-20 (×3): 20 mg via ORAL
  Filled 2015-10-19 (×3): qty 2

## 2015-10-19 MED ORDER — TORSEMIDE 20 MG PO TABS
60.0000 mg | ORAL_TABLET | Freq: Two times a day (BID) | ORAL | Status: DC
  Administered 2015-10-20: 60 mg via ORAL
  Administered 2015-10-20: 20 mg via ORAL
  Filled 2015-10-19 (×5): qty 3

## 2015-10-19 MED ORDER — METOLAZONE 2.5 MG PO TABS: 2.5000 mg | ORAL_TABLET | ORAL | Status: DC

## 2015-10-19 MED ORDER — LORATADINE 10 MG PO TABS
10.0000 mg | ORAL_TABLET | Freq: Two times a day (BID) | ORAL | Status: DC
  Administered 2015-10-19 – 2015-10-20 (×2): 10 mg via ORAL
  Filled 2015-10-19 (×2): qty 1

## 2015-10-19 MED ORDER — INSULIN ASPART 100 UNIT/ML ~~LOC~~ SOLN: 0.0000 [IU] | Freq: Every day | SUBCUTANEOUS | Status: DC

## 2015-10-19 MED ORDER — ACETAMINOPHEN 325 MG PO TABS: 650.0000 mg | ORAL_TABLET | Freq: Four times a day (QID) | ORAL | Status: DC | PRN

## 2015-10-19 MED ORDER — ACETAMINOPHEN 650 MG RE SUPP: 650.0000 mg | Freq: Four times a day (QID) | RECTAL | Status: DC | PRN

## 2015-10-19 MED ORDER — MORPHINE SULFATE (PF) 2 MG/ML IV SOLN: 1.0000 mg | INTRAVENOUS | Status: DC | PRN

## 2015-10-19 MED ORDER — SODIUM CHLORIDE 0.9 % IV SOLN
INTRAVENOUS | Status: DC
  Administered 2015-10-19 – 2015-10-20 (×3): via INTRAVENOUS

## 2015-10-19 MED ORDER — ONDANSETRON HCL 4 MG/2ML IJ SOLN: 4.0000 mg | Freq: Four times a day (QID) | INTRAMUSCULAR | Status: DC | PRN

## 2015-10-19 MED ORDER — PRAVASTATIN SODIUM 20 MG PO TABS
80.0000 mg | ORAL_TABLET | Freq: Every day | ORAL | Status: DC
  Administered 2015-10-19: 80 mg via ORAL
  Filled 2015-10-19: qty 4

## 2015-10-19 MED ORDER — ASPIRIN EC 325 MG PO TBEC
325.0000 mg | DELAYED_RELEASE_TABLET | Freq: Every day | ORAL | Status: DC
  Administered 2015-10-20: 325 mg via ORAL
  Filled 2015-10-19: qty 1

## 2015-10-19 NOTE — Telephone Encounter (Signed)
Pt already got results from [facility]

## 2015-10-19 NOTE — Telephone Encounter (Signed)
Left VM on cell and home phone, asking pt to return my call

## 2015-10-19 NOTE — H&P (Signed)
Lindsay Harmon is an 59 y.o. female.   Chief Complaint: Admitted on clinic HPI: The patient presents to the hospital as a direct admission from nephrology clinic. She had been sent there for hyponatremia. She was evaluated by Dr. Holley Raring and admitted for resuscitation. Past medical history is significant for chronic kidney disease as well as right-sided heart failure. She also has chronic hyponatremia, however laboratory evaluation yesterday revealed acute on chronic hyponatremia with a sodium level of 113. The patient feels well aside from a few episodes of nausea the day before admission. She vomited once after eating but then was able to keep food down. Emesis was nonbloody and nonbilious. Upon arrival the patient was in no acute distress.  Past Medical History  Diagnosis Date  . Depression   . GERD (gastroesophageal reflux disease)   . Hyperlipidemia   . Hypertension   . PVD (peripheral vascular disease) ([place])   . CAD (coronary artery disease)   . Familial hematuria   . NIDDM (non-insulin dependent diabetes mellitus)     with neuropathy  . IBS (irritable bowel syndrome)   . Esophageal stricture   . Obesity, unspecified   . Nephrolithiasis   . Allergy   . Arthritis   . Pulmonary hypertension [facility])     Past Surgical History  Procedure Laterality Date  . Abdominal hysterectomy    . Cesarean section    . Carpal tunnel release    . Right heart catheterization N/A 12/05/2014    Procedure: RIGHT HEART CATH;  Surgeon: Sinclair Grooms, MD;  Location: [facility] CATH LAB;  Service: Cardiovascular;  Laterality: N/A;    Family History  Problem Relation Age of Onset  . Diabetes Mother   . Emphysema Paternal Grandfather   . Allergies Sister   . Allergies Father   . Cancer Mother     ovarian,melanoma  . Cancer Father     lung  . Cancer Maternal Grandmother     uterine   Social History:  reports that she quit smoking about 6 years ago. Her smoking use included Cigarettes. She has a 30  pack-year smoking history. She has never used smokeless tobacco. She reports that she does not drink alcohol or use illicit drugs.  Allergies: No Known Allergies  Medications Prior to Admission  Medication Sig Dispense Refill  . ACCU-CHEK AVIVA PLUS test strip USE 1 STRIP DAILY TO CHECK BLOOD SUGAR 100 each 1  . albuterol (PROVENTIL HFA;VENTOLIN HFA) 108 (90 BASE) MCG/ACT inhaler Inhale 2 puffs into the lungs every 6 (six) hours as needed for wheezing or shortness of breath. 1 Inhaler 2  . ALPRAZolam (XANAX) 0.25 MG tablet TAKE ONE (1) TABLET EACH DAY 90 tablet 0  . aspirin 325 MG tablet Take 325 mg by mouth daily.      Marland Kitchen gabapentin (NEURONTIN) 300 MG capsule Take 300 mg by mouth 4 (four) times daily.    Marland Kitchen glucose blood (ACCU-CHEK AVIVA PLUS) test strip Use to check blood sugar once daily Dx: E11.49 100 each 1  . loratadine (CLARITIN) 10 MG tablet Take 10 mg by mouth 2 (two) times daily.     Marland Kitchen lovastatin (MEVACOR) 40 MG tablet TAKE 2 TABLETS EVERY DAY 180 tablet 3  . metFORMIN (GLUCOPHAGE) 1000 MG tablet TAKE 1/2 TABLET TWICE DAILY WITH A MEAL 90 tablet 3  . methadone (DOLOPHINE) 10 MG tablet Take 2 tablets (20 mg total) by mouth 4 (four) times daily. 240 tablet 0  . metolazone (ZAROXOLYN) 2.5 MG tablet  Take 1 tablet (2.5 mg total) by mouth as directed. 90 tablet 3  . metoprolol tartrate (LOPRESSOR) 25 MG tablet TAKE 1 TABLET TWICE DAILY 180 tablet 3  . mirtazapine (REMERON) 7.5 MG tablet Take 1 tablet (7.5 mg total) by mouth at bedtime. (Patient taking differently: Take 3.25 mg by mouth at bedtime. ) 30 tablet 0  . pantoprazole (PROTONIX) 40 MG tablet TAKE 1 TABLET TWICE DAILY 180 tablet 3  . potassium chloride SA (K-DUR,KLOR-CON) 20 MEQ tablet Take 20 mEq by mouth as needed. Take 1 (20 meq) tablet when you take metolazone    . Tiotropium Bromide-Olodaterol (STIOLTO RESPIMAT) 2.5-2.5 MCG/ACT AERS Inhale 2 puffs into the lungs daily. 4 g 11  . torsemide (DEMADEX) 20 MG tablet Take 3 tablets (60  mg total) by mouth 2 (two) times daily. 540 tablet 3    Results for orders placed or performed in visit on 10/18/15 (from the past 48 hour(s))  Renal function panel     Status: Abnormal   Collection Time: 10/18/15 11:46 AM  Result Value Ref Range   Sodium 113 Repeated and verified X2. (LL) 135 - 145 mEq/L   Potassium 3.4 (L) 3.5 - 5.1 mEq/L   Chloride 65 (L) 96 - 112 mEq/L   CO2 38 (H) 19 - 32 mEq/L   Calcium 9.7 8.4 - 10.5 mg/dL   Albumin 4.3 3.5 - 5.2 g/dL   BUN 33 (H) 6 - 23 mg/dL   Creatinine, Ser 1.77 (H) 0.40 - 1.20 mg/dL   Glucose, Bld 186 (H) 70 - 99 mg/dL   Phosphorus 3.1 2.3 - 4.6 mg/dL   GFR 30.91 (L) >60.00 mL/min  Hemoglobin A1c     Status: Abnormal   Collection Time: 10/18/15 11:46 AM  Result Value Ref Range   Hgb A1c MFr Bld 8.3 (H) 4.6 - 6.5 %    Comment: Glycemic Control Guidelines for People with Diabetes:Non Diabetic:  <6%Goal of Therapy: <7%Additional Action Suggested:  >8%    No results found.  Review of Systems  Constitutional: Negative for fever and chills.  HENT: Negative for sore throat and tinnitus.   Eyes: Negative for blurred vision and redness.  Respiratory: Negative for cough and shortness of breath.   Cardiovascular: Negative for chest pain, palpitations, orthopnea and PND.  Gastrointestinal: Positive for nausea. Negative for vomiting, abdominal pain and diarrhea.  Genitourinary: Negative for dysuria, urgency and frequency.  Musculoskeletal: Negative for myalgias and joint pain.  Skin: Negative for rash.       No lesions  Neurological: Negative for speech change, focal weakness and weakness.  Endo/Heme/Allergies: Does not bruise/bleed easily.       No temperature intolerance  Psychiatric/Behavioral: Negative for depression and suicidal ideas.    There were no vitals taken for this visit. Physical Exam  Vitals reviewed. Constitutional: She is oriented to person, place, and time. She appears well-developed and well-nourished. No distress.   HENT:  Head: Normocephalic and atraumatic.  Mouth/Throat: Oropharynx is clear and moist.  Eyes: Conjunctivae and EOM are normal. Pupils are equal, round, and reactive to light. No scleral icterus.  Neck: Normal range of motion. Neck supple. No JVD present. No tracheal deviation present. No thyromegaly present.  Cardiovascular: Normal rate, regular rhythm and normal heart sounds.  Exam reveals no gallop and no friction rub.   No murmur heard. Respiratory: Effort normal and breath sounds normal.  GI: Soft. Bowel sounds are normal. She exhibits no distension. There is no tenderness.  Genitourinary:  Deferred  Musculoskeletal: Normal range of motion. She exhibits edema.  Lymphadenopathy:    She has no cervical adenopathy.  Neurological: She is alert and oriented to person, place, and time. No cranial nerve deficit. She exhibits normal muscle tone.  Skin: Skin is warm and dry. No rash noted. No erythema.  Psychiatric: She has a normal mood and affect. Her behavior is normal. Judgment and thought content normal.     Assessment/Plan This is a 59 year old Caucasian female admitted for acute on chronic hyponatremia. 1. Hyponatremia: The patient is on large doses of diuretic therapy for chronic kidney disease as well as congestive heart failure. She has no mental status changes although her nausea yesterday may be secondary to osmolarity changes. She feels well today. No need for hypertonic saline at this time. We will hydrate the patient with normal saline. Check sodium every 4 hours. I have encouraged her to be lenient regarding salt on her food. (The patient had been using salt substitute for approximately one year). She has not been on fluid restriction but had been purposely monitoring her fluid intake based on presence of lower extremity edema. At this time I will not place a fluid restriction on the patient will defer to nephrology for recommendations regarding total fluid intake per day. 2.  Diabetes mellitus type 2: Hold metformin while patient is hospitalized. Sliding scale insulin for now 3. Chronic kidney disease: Stage IV. Repeat BMP to check electrolytes and kidney function. 4. Congestive heart failure: Diastolic; stable. Continue diuretic therapy per home regimen to maintain euvolemia while we increase the patient's serum sodium. (Hold metolazone). 5. Essential hypertension: Continue metoprolol 6. COPD: Stable. (Chronic lung disease may contribute to hyponatremia). Continue inhalers per home regimen 7. CAD: Stable. Continue aspirin 8. Nephropathy: Secondary to diabetes. Hemoglobin A1c 8.3; continue gabapentin per home regimen 9. DVT prophylaxis: Heparin  10. GI prophylaxis: Continue PPI therapy per home regimen The patient is a full code. Time spent on admission orders and patient care approximately 45 minutes  Harrie Foreman 10/19/2015, 2:16 PM

## 2015-10-20 LAB — GLUCOSE, CAPILLARY: Glucose-Capillary: 166 mg/dL — ABNORMAL HIGH (ref 65–99)

## 2015-10-20 LAB — SODIUM
Sodium: 121 mmol/L — ABNORMAL LOW (ref 135–145)
Sodium: 124 mmol/L — ABNORMAL LOW (ref 135–145)

## 2015-10-20 MED ORDER — TORSEMIDE 20 MG PO TABS: 20.0000 mg | ORAL_TABLET | Freq: Two times a day (BID) | ORAL | Status: DC

## 2015-10-20 NOTE — Discharge Summary (Signed)
Lindsay Harmon, 59 y.o., DOB Jun 09, 1956, MRN [id number]. Admission date: 10/19/2015 Discharge Date 10/20/2015 Primary MD Viviana Simpler, MD Admitting Physician Harrie Foreman, MD  Admission Diagnosis  Acute on Chronic Hyponatremia  Discharge Diagnosis   Principal Problem:   Hyponatremia suspect due to over diuresis now improved and at baseline Depression GERD Hyperlipidemia Hypertension Peripheral vascular disease Coronary artery disease Non-insulin-dependent diabetes Irritable bowel syndrome Chronic respiratory failure on 2 L of oxygen Esophageal stricture Allergies  Pulmonary hypertension        Hospital Course The patient presents to the hospital as a direct admission from nephrology clinic. She had been sent there for hyponatremia. She was evaluated by Dr. Holley Raring and admitted for resuscitation. Past medical history is significant for chronic kidney disease as well as right-sided heart failure. She also has chronic hyponatremia, however laboratory in nephrologist office revealed acute on chronic hyponatremia with a sodium level of 113. Patient was admitted for hyponatremia. Her sodium was monitored closely she was given IV fluids her diuretics were held. Her sodium is close to her baseline she is feeling well was seen by Dr. Candiss Norse who cleared her for discharge. I have decreased her diuretic dose. She'll need to follow-up with Dr. Holley Raring on Tuesday. To repeat her BMP.             Consults  nephrology  Significant Tests:  See full reports for all details    No results found.     Today   Subjective:   Marily Konczal feels better denies any   complaints  Objective:   Blood pressure 128/63, pulse 65, temperature 97.5 F (36.4 C), temperature source Oral, resp. rate 17, height _0  (1.549 m), weight 71.578 kg (157 lb 12.8 oz), SpO2 95 %.  .  Intake/Output Summary (Last 24 hours) at 10/20/15 1454 Last data filed at 10/20/15 1005  Gross per 24 hour   Intake 2811.92 ml  Output   3850 ml  Net -1038.08 ml    Exam VITAL SIGNS: Blood pressure 128/63, pulse 65, temperature 97.5 F (36.4 C), temperature source Oral, resp. rate 17, height _1  (1.549 m), weight 71.578 kg (157 lb 12.8 oz), SpO2 95 %.  GENERAL:  59 y.o.-year-old patient lying in the bed with no acute distress.  EYES: Pupils equal, round, reactive to light and accommodation. No scleral icterus. Extraocular muscles intact.  HEENT: Head atraumatic, normocephalic. Oropharynx and nasopharynx clear.  NECK:  Supple, no jugular venous distention. No thyroid enlargement, no tenderness.  LUNGS: Normal breath sounds bilaterally, no wheezing, rales,rhonchi or crepitation. No use of accessory muscles of respiration.  CARDIOVASCULAR: S1, S2 normal. No murmurs, rubs, or gallops.  ABDOMEN: Soft, nontender, nondistended. Bowel sounds present. No organomegaly or mass.  EXTREMITIES: No pedal edema, cyanosis, or clubbing.  NEUROLOGIC: Cranial nerves II through XII are intact. Muscle strength 5/5 in all extremities. Sensation intact. Gait not checked.  PSYCHIATRIC: The patient is alert and oriented x 3.  SKIN: No obvious rash, lesion, or ulcer.   Data Review     CBC w Diff: Lab Results  Component Value Date   WBC 12.9* 06/06/2015   HGB 15.4* 06/06/2015   HCT 43.6 06/06/2015   PLT 267 06/06/2015   LYMPHOPCT 26 06/03/2015   MONOPCT 7 06/03/2015   EOSPCT 3 06/03/2015   BASOPCT 0 06/03/2015   CMP: Lab Results  Component Value Date   NA 124* 10/20/2015   K 3.4* 10/18/2015   CL 65* 10/18/2015   CO2 38* 10/18/2015  BUN 33* 10/18/2015   CREATININE 1.77* 10/18/2015   PROT 7.5 12/13/2014   ALBUMIN 4.3 10/18/2015   BILITOT 0.8 12/13/2014   ALKPHOS 164* 12/13/2014   AST 20 12/13/2014   ALT 22 12/13/2014  .  Micro Results No results found for this or any previous visit (from the past 240 hour(s)).         Follow-up Information    Follow up with Anthonette Legato, MD On  10/24/2015.   Specialty:  Internal Medicine   Why:  Office is closed left a message patient shlould call back on Tuesday 10/24/15   Contact information:   [place] [address] [phone]       Discharge Medications     Medication List    STOP taking these medications        metolazone 2.5 MG tablet  Commonly known as:  ZAROXOLYN      TAKE these medications        albuterol 108 (90 BASE) MCG/ACT inhaler  Commonly known as:  PROVENTIL HFA;VENTOLIN HFA  Inhale 2 puffs into the lungs every 6 (six) hours as needed for wheezing or shortness of breath.     ALPRAZolam 0.25 MG tablet  Commonly known as:  XANAX  TAKE ONE (1) TABLET EACH DAY     aspirin 325 MG tablet  Take 325 mg by mouth daily.     gabapentin 300 MG capsule  Commonly known as:  NEURONTIN  Take 300 mg by mouth 4 (four) times daily.     glucose blood test strip  Commonly known as:  ACCU-CHEK AVIVA PLUS  Use to check blood sugar once daily Dx: E11.49     ACCU-CHEK AVIVA PLUS test strip  Generic drug:  glucose blood  USE 1 STRIP DAILY TO CHECK BLOOD SUGAR     loratadine 10 MG tablet  Commonly known as:  CLARITIN  Take 10 mg by mouth 2 (two) times daily.     lovastatin 40 MG tablet  Commonly known as:  MEVACOR  TAKE 2 TABLETS EVERY DAY     metFORMIN 1000 MG tablet  Commonly known as:  GLUCOPHAGE  TAKE 1/2 TABLET TWICE DAILY WITH A MEAL     methadone 10 MG tablet  Commonly known as:  DOLOPHINE  Take 2 tablets (20 mg total) by mouth 4 (four) times daily.     metoprolol tartrate 25 MG tablet  Commonly known as:  LOPRESSOR  TAKE 1 TABLET TWICE DAILY     mirtazapine 7.5 MG tablet  Commonly known as:  REMERON  Take 1 tablet (7.5 mg total) by mouth at bedtime.     pantoprazole 40 MG tablet  Commonly known as:  PROTONIX  TAKE 1 TABLET TWICE DAILY     potassium chloride SA 20 MEQ tablet  Commonly known as:  K-DUR,KLOR-CON  Take 20 mEq by mouth as needed. Take 1 (20 meq)  tablet when you take metolazone     Tiotropium Bromide-Olodaterol 2.5-2.5 MCG/ACT Aers  Commonly known as:  STIOLTO RESPIMAT  Inhale 2 puffs into the lungs daily.     torsemide 20 MG tablet  Commonly known as:  DEMADEX  Take 1 tablet (20 mg total) by mouth 2 (two) times daily.           Total Time in preparing paper work, data evaluation and todays exam - 35 minutes  Dustin Flock M.D on 10/20/2015 at 2:54 PM  [facility]   Office  [phone]

## 2015-10-20 NOTE — Progress Notes (Signed)
Pt to be discharged per MD order. IV removed. All instructions reviewed with pt and family, all questions answered. No scripts. Pt to be taken out in wheelchair.

## 2015-10-20 NOTE — Progress Notes (Signed)
Subjective:  Patient known to our practice from outpatient She was sent for hyponatremia thought to be secondary to overdiuresis with torsemide She was hydrated with iv saline Na is back to baseline Patient feels well today   Objective:  Vital signs in last 24 hours:  Temp:  [97.5 F (36.4 C)-98.4 F (36.9 C)] 97.5 F (36.4 C) (12/23 0527) Pulse Rate:  [65-84] 65 (12/23 0527) Resp:  [17-18] 17 (12/23 0527) BP: (119-134)/(58-68) 128/63 mmHg (12/23 0527) SpO2:  [90 %-95 %] 95 % (12/23 0527) Weight:  [71.578 kg (157 lb 12.8 oz)-71.668 kg (158 lb)] 71.578 kg (157 lb 12.8 oz) (12/23 0500)  Weight change:  Filed Weights   10/19/15 1500 10/20/15 0500  Weight: 71.668 kg (158 lb) 71.578 kg (157 lb 12.8 oz)    Intake/Output:    Intake/Output Summary (Last 24 hours) at 10/20/15 8242 Last data filed at 10/20/15 0800  Gross per 24 hour  Intake 2811.92 ml  Output   2750 ml  Net  61.92 ml     Physical Exam: General: NAD, laying in bed  HEENT Anicteric, moist mucus membranes  Neck supple  Pulm/lungs Clear b/l, normal effort  CVS/Heart Regular, no rub  Abdomen:  Soft, ND, NT  Extremities: No edema  Neurologic: Alert, oriented  Skin: No acute rashes  Access:        Basic Metabolic Panel:   Recent Labs Lab 10/18/15 1146 10/19/15 1436 10/19/15 1832 10/19/15 2238 10/20/15 0211 10/20/15 0610  NA 113 Repeated and verified X2.*  --  117* 119* 121* 124*  K 3.4*  --   --   --   --   --   CL 65*  --   --   --   --   --   CO2 38*  --   --   --   --   --   GLUCOSE 186*  --   --   --   --   --   BUN 33*  --   --   --   --   --   CREATININE 1.77*  --   --   --   --   --   CALCIUM 9.7  --   --   --   --   --   MG  --  1.7  --   --   --   --   PHOS 3.1  --   --   --   --   --      CBC: No results for input(s): WBC, NEUTROABS, HGB, HCT, MCV, PLT in the last 168 hours.    Microbiology:  No results found for this or any previous visit (from the past 720  hour(s)).  Coagulation Studies: No results for input(s): LABPROT, INR in the last 72 hours.  Urinalysis: No results for input(s): COLORURINE, LABSPEC, PHURINE, GLUCOSEU, HGBUR, BILIRUBINUR, KETONESUR, PROTEINUR, UROBILINOGEN, NITRITE, LEUKOCYTESUR in the last 72 hours.  Invalid input(s): APPERANCEUR    Imaging: No results found.   Medications:   . sodium chloride 125 mL/hr at 10/20/15 0658   . aspirin EC  325 mg Oral Daily  . docusate sodium  100 mg Oral BID  . gabapentin  300 mg Oral QID  . heparin  5,000 Units Subcutaneous 3 times per day  . insulin aspart  0-5 Units Subcutaneous QHS  . insulin aspart  0-9 Units Subcutaneous TID WC  . loratadine  10 mg Oral BID  . methadone  20  mg Oral QID  . metoprolol tartrate  25 mg Oral BID  . mirtazapine  7.5 mg Oral QHS  . pantoprazole  40 mg Oral BID  . potassium chloride SA  20 mEq Oral QODAY  . pravastatin  80 mg Oral q1800  . salmeterol  1 puff Inhalation BID  . sodium chloride  3 mL Intravenous Q12H  . tiotropium  18 mcg Inhalation Daily  . torsemide  60 mg Oral BID   acetaminophen **OR** acetaminophen, albuterol, ALPRAZolam, morphine injection, ondansetron **OR** ondansetron (ZOFRAN) IV  Assessment/ Plan:  59 y.o. female with past medical history of diabetes mellitus type 2, hyperlipidemia, episodic mood disorder, coronary artery disease, peripheral vascular disease, GERD, degenerative joint disease, chronic back pain, peripheral neuropathy, right-sided heart failure, chronic respiratory failure on home oxygen, pulmonary hypertension, chronic and disease stage III who was referred for urgent evaluation management of hyponatremia.   1. Acute on chronic hyponatremia with serum sodium of 113. 2. Chronic kidney disease stage III. 3. Diabetes mellitus type 2 with chronic kidney disease. 4. Hypertension. 5. Chronic respiratory failure secondary to COPD on home oxygen.  Plan Hold Torsemide for next 3-4 days Resume only once a  day dose next week F/u in office with Dr Holley Raring     LOS: 1 Aliece Honold 12/23/20169:27 AM

## 2015-10-20 NOTE — Discharge Instructions (Signed)
DIET:  Cardiac diet  DISCHARGE CONDITION:  Stable  ACTIVITY:  Activity as tolerated  OXYGEN:  Home Oxygen: Yes.     Oxygen Delivery: 2 liters/min via Patient connected to nasal cannula oxygen  DISCHARGE LOCATION:  home    ADDITIONAL DISCHARGE INSTRUCTION:   If you experience worsening of your admission symptoms, develop shortness of breath, life threatening emergency, suicidal or homicidal thoughts you must seek medical attention immediately by calling 911 or calling your MD immediately  if symptoms less severe.  You Must read complete instructions/literature along with all the possible adverse reactions/side effects for all the Medicines you take and that have been prescribed to you. Take any new Medicines after you have completely understood and accpet all the possible adverse reactions/side effects.   Please note  You were cared for by a hospitalist during your hospital stay. If you have any questions about your discharge medications or the care you received while you were in the hospital after you are discharged, you can call the unit and asked to speak with the hospitalist on call if the hospitalist that took care of you is not available. Once you are discharged, your primary care physician will handle any further medical issues. Please note that NO REFILLS for any discharge medications will be authorized once you are discharged, as it is imperative that you return to your primary care physician (or establish a relationship with a primary care physician if you do not have one) for your aftercare needs so that they can reassess your need for medications and monitor your lab values.

## 2015-10-20 NOTE — Care Management Important Message (Signed)
Important Message  Patient Details  Name: Lindsay Harmon MRN: [id number] Date of Birth: January 24, 1956   Medicare Important Message Given:  Yes    Mikaelah Trostle A, RN 10/20/2015, 8:36 AM

## 2015-10-26 ENCOUNTER — Encounter: Payer: Self-pay | Admitting: *Deleted

## 2015-10-26 ENCOUNTER — Emergency Department
Admission: EM | Admit: 2015-10-26 | Discharge: 2015-10-26 | Disposition: A | Discharge status: Home / Self Care | Payer: Commercial Managed Care - HMO | Attending: Emergency Medicine | Admitting: Emergency Medicine

## 2015-10-26 ENCOUNTER — Other Ambulatory Visit: Payer: Self-pay

## 2015-10-26 DIAGNOSIS — I129 Hypertensive chronic kidney disease with stage 1 through stage 4 chronic kidney disease, or unspecified chronic kidney disease: Secondary | ICD-10-CM | POA: Diagnosis not present

## 2015-10-26 DIAGNOSIS — E876 Hypokalemia: Secondary | ICD-10-CM | POA: Insufficient documentation

## 2015-10-26 DIAGNOSIS — Z87891 Personal history of nicotine dependence: Secondary | ICD-10-CM | POA: Diagnosis not present

## 2015-10-26 DIAGNOSIS — Z79899 Other long term (current) drug therapy: Secondary | ICD-10-CM | POA: Diagnosis not present

## 2015-10-26 DIAGNOSIS — Z7984 Long term (current) use of oral hypoglycemic drugs: Secondary | ICD-10-CM | POA: Insufficient documentation

## 2015-10-26 DIAGNOSIS — Z7982 Long term (current) use of aspirin: Secondary | ICD-10-CM | POA: Diagnosis not present

## 2015-10-26 DIAGNOSIS — N184 Chronic kidney disease, stage 4 (severe): Secondary | ICD-10-CM | POA: Diagnosis not present

## 2015-10-26 DIAGNOSIS — R112 Nausea with vomiting, unspecified: Secondary | ICD-10-CM | POA: Diagnosis present

## 2015-10-26 DIAGNOSIS — E1142 Type 2 diabetes mellitus with diabetic polyneuropathy: Secondary | ICD-10-CM | POA: Insufficient documentation

## 2015-10-26 DIAGNOSIS — E1159 Type 2 diabetes mellitus with other circulatory complications: Secondary | ICD-10-CM | POA: Insufficient documentation

## 2015-10-26 LAB — CBC
HEMATOCRIT: 36.2 % (ref 35.0–47.0)
Hemoglobin: 12 g/dL (ref 12.0–16.0)
MCH: 27.8 pg (ref 26.0–34.0)
MCHC: 33.3 g/dL (ref 32.0–36.0)
MCV: 83.5 fL (ref 80.0–100.0)
PLATELETS: 210 10*3/uL (ref 150–440)
RBC: 4.33 MIL/uL (ref 3.80–5.20)
RDW: 14.8 % — AB (ref 11.5–14.5)
WBC: 18.6 10*3/uL — AB (ref 3.6–11.0)

## 2015-10-26 LAB — COMPREHENSIVE METABOLIC PANEL
ALBUMIN: 3.8 g/dL (ref 3.5–5.0)
ALT: 19 U/L (ref 14–54)
AST: 19 U/L (ref 15–41)
Alkaline Phosphatase: 94 U/L (ref 38–126)
Anion gap: 9 (ref 5–15)
BUN: 19 mg/dL (ref 6–20)
CHLORIDE: 87 mmol/L — AB (ref 101–111)
CO2: 34 mmol/L — AB (ref 22–32)
CREATININE: 1.6 mg/dL — AB (ref 0.44–1.00)
Calcium: 8.5 mg/dL — ABNORMAL LOW (ref 8.9–10.3)
GFR calc Af Amer: 39 mL/min — ABNORMAL LOW (ref >60)
GFR, EST NON AFRICAN AMERICAN: 34 mL/min — AB (ref >60)
Glucose, Bld: 185 mg/dL — ABNORMAL HIGH (ref 65–99)
POTASSIUM: 2.6 mmol/L — AB (ref 3.5–5.1)
SODIUM: 130 mmol/L — AB (ref 135–145)
Total Bilirubin: 0.8 mg/dL (ref 0.3–1.2)
Total Protein: 7.2 g/dL (ref 6.5–8.1)

## 2015-10-26 LAB — LIPASE, BLOOD: LIPASE: 17 U/L (ref 11–51)

## 2015-10-26 LAB — TROPONIN I: Troponin I: <0.03 ng/mL (ref <0.031)

## 2015-10-26 MED ORDER — SODIUM CHLORIDE 0.9 % IV BOLUS (SEPSIS)
1000.0000 mL | Freq: Once | INTRAVENOUS | Status: AC
  Administered 2015-10-26: 1000 mL via INTRAVENOUS

## 2015-10-26 MED ORDER — ONDANSETRON HCL 4 MG PO TABS: 4.0000 mg | ORAL_TABLET | Freq: Every day | ORAL | Status: DC | PRN

## 2015-10-26 MED ORDER — POTASSIUM CHLORIDE CRYS ER 20 MEQ PO TBCR
40.0000 meq | EXTENDED_RELEASE_TABLET | Freq: Once | ORAL | Status: AC
  Administered 2015-10-26: 40 meq via ORAL
  Filled 2015-10-26: qty 2

## 2015-10-26 NOTE — ED Notes (Signed)
Vomited last pm,has not eaten today, recent adm for low na

## 2015-10-26 NOTE — ED Provider Notes (Signed)
[facility] Emergency Department Provider Note    ____________________________________________  Time seen: 2050  I have reviewed the triage vital signs and the nursing notes.   HISTORY  Chief Complaint Emesis   History limited by: Not Limited   HPI Lindsay Harmon is a 59 y.o. female who presented to the emergency department today with primary concern for possible abnormal electrolytes. The patient states that last night she had some vomiting. She states she had multiple episodes of vomiting. She has not had vomiting now for over 12 hours. She did not have any associated abdominal pain. She did not have any associated diarrhea. No.fevers. Today family was concerned that she might have electrolyte abnormalities given recent hospitalization for hyponatremia.     Past Medical History  Diagnosis Date  . Depression   . GERD (gastroesophageal reflux disease)   . Hyperlipidemia   . Hypertension   . PVD (peripheral vascular disease) ([place])   . CAD (coronary artery disease)   . Familial hematuria   . NIDDM (non-insulin dependent diabetes mellitus)     with neuropathy  . IBS (irritable bowel syndrome)   . Esophageal stricture   . Obesity, unspecified   . Nephrolithiasis   . Allergy   . Arthritis   . Pulmonary hypertension [facility])     Patient Active Problem List   Diagnosis Date Noted  . Swelling of lower extremity 07/13/2015  . Hyposmolality and/or hyponatremia 06/22/2015  . Cor pulmonale, chronic ([place])   . Pulmonary artery hypertension ([place])   . Hyponatremia 06/02/2015  . Pulmonary hypertension ([place])   . RVF (right ventricular failure) ([place]) 01/17/2015  . Chronic respiratory failure with hypoxia ([place]) 12/15/2014  . COPD (chronic obstructive pulmonary disease) ([place]) 12/15/2014  . Demand ischemia ([place]) 12/05/2014  . Chronic diastolic heart failure ([place]) 12/05/2014  . Pulmonary HTN ([place]) 12/05/2014  . Hypoalbuminemia   . Chronic renal disease,  stage IV ([place]) 12/01/2014  . Advance directive discussed with patient 11/08/2014  . DM (diabetes mellitus) type II controlled peripheral vascular disorder ([place]) 03/15/2014  . Diabetes, polyneuropathy ([place]) 09/15/2013  . Routine general medical examination at a health care facility 08/13/2011  . Coronary atherosclerosis of native coronary artery 02/27/2007  . Chronic back pain 02/27/2007  . Type 2 diabetes mellitus with neurological manifestations, controlled ([place]) 02/26/2007  . Hyperlipemia 02/26/2007  . Episodic mood disorder ([place]) 02/26/2007  . Essential hypertension, benign 02/26/2007  . PVD (peripheral vascular disease) ([place]) 02/26/2007  . GERD 02/26/2007  . DEGENERATIVE JOINT DISEASE 02/26/2007    Past Surgical History  Procedure Laterality Date  . Abdominal hysterectomy    . Cesarean section    . Carpal tunnel release    . Right heart catheterization N/A 12/05/2014    Procedure: RIGHT HEART CATH;  Surgeon: Sinclair Grooms, MD;  Location: [facility] CATH LAB;  Service: Cardiovascular;  Laterality: N/A;    Current Outpatient Rx  Name  Route  Sig  Dispense  Refill  . ACCU-CHEK AVIVA PLUS test strip      USE 1 STRIP DAILY TO CHECK BLOOD SUGAR   100 each   1     Pt is not on insulin and standard testing for non  ...   . albuterol (PROVENTIL HFA;VENTOLIN HFA) 108 (90 BASE) MCG/ACT inhaler   Inhalation   Inhale 2 puffs into the lungs every 6 (six) hours as needed for wheezing or shortness of breath.   1 Inhaler   2   . ALPRAZolam Duanne Moron)  0.25 MG tablet      TAKE ONE (1) TABLET EACH DAY   90 tablet   0   . aspirin 325 MG tablet   Oral   Take 325 mg by mouth daily.           Marland Kitchen gabapentin (NEURONTIN) 300 MG capsule   Oral   Take 300 mg by mouth 4 (four) times daily.         Marland Kitchen glucose blood (ACCU-CHEK AVIVA PLUS) test strip      Use to check blood sugar once daily Dx: E11.49   100 each   1   . loratadine (CLARITIN) 10 MG tablet   Oral   Take 10 mg by mouth 2  (two) times daily.          Marland Kitchen lovastatin (MEVACOR) 40 MG tablet      TAKE 2 TABLETS EVERY DAY   180 tablet   3   . metFORMIN (GLUCOPHAGE) 1000 MG tablet      TAKE 1/2 TABLET TWICE DAILY WITH A MEAL   90 tablet   3   . methadone (DOLOPHINE) 10 MG tablet   Oral   Take 2 tablets (20 mg total) by mouth 4 (four) times daily.   240 tablet   0   . metoprolol tartrate (LOPRESSOR) 25 MG tablet      TAKE 1 TABLET TWICE DAILY   180 tablet   3   . mirtazapine (REMERON) 7.5 MG tablet   Oral   Take 1 tablet (7.5 mg total) by mouth at bedtime. Patient taking differently: Take 3.25 mg by mouth at bedtime.    30 tablet   0   . pantoprazole (PROTONIX) 40 MG tablet      TAKE 1 TABLET TWICE DAILY   180 tablet   3   . potassium chloride SA (K-DUR,KLOR-CON) 20 MEQ tablet   Oral   Take 20 mEq by mouth as needed. Take 1 (20 meq) tablet when you take metolazone         . Tiotropium Bromide-Olodaterol (STIOLTO RESPIMAT) 2.5-2.5 MCG/ACT AERS   Inhalation   Inhale 2 puffs into the lungs daily.   4 g   11   . torsemide (DEMADEX) 20 MG tablet   Oral   Take 1 tablet (20 mg total) by mouth 2 (two) times daily.   540 tablet   3     Allergies Review of patient's allergies indicates no known allergies.  Family History  Problem Relation Age of Onset  . Diabetes Mother   . Emphysema Paternal Grandfather   . Allergies Sister   . Allergies Father   . Cancer Mother     ovarian,melanoma  . Cancer Father     lung  . Cancer Maternal Grandmother     uterine    Social History Social History  Substance Use Topics  . Smoking status: Former Smoker -- 1.50 packs/day for 20 years    Types: Cigarettes    Quit date: 01/04/2009  . Smokeless tobacco: Never Used  . Alcohol Use: No     Comment: heavy in the past    Review of Systems  Constitutional: Negative for fever. Cardiovascular: Negative for chest pain. Respiratory: Negative for shortness of breath. Gastrointestinal:  Negative for abdominal pain. Positive for vomiting. Neurological: Negative for headaches, focal weakness or numbness.   10-point ROS otherwise negative.  ____________________________________________   PHYSICAL EXAM:  VITAL SIGNS: ED Triage Vitals  Enc Vitals Group  BP 10/26/15 1740 111/55 mmHg     Pulse Rate 10/26/15 1740 101     Resp 10/26/15 1740 24     Temp 10/26/15 1740 99.6 F (37.6 C)     Temp Source 10/26/15 1740 Oral     SpO2 10/26/15 1740 96 %     Weight 10/26/15 1740 150 lb (68.04 kg)     Height 10/26/15 1740 _0  (1.575 m)   Constitutional: Alert and oriented. Well appearing and in no distress. Eyes: Conjunctivae are normal. PERRL. Normal extraocular movements. ENT   Head: Normocephalic and atraumatic.   Nose: No congestion/rhinnorhea.   Mouth/Throat: Mucous membranes are moist.   Neck: No stridor. Hematological/Lymphatic/Immunilogical: No cervical lymphadenopathy. Cardiovascular: Normal rate, regular rhythm.  No murmurs, rubs, or gallops. Respiratory: Normal respiratory effort without tachypnea nor retractions. Breath sounds are clear and equal bilaterally. No wheezes/rales/rhonchi. Gastrointestinal: Soft and nontender. No distention.  Genitourinary: Deferred Musculoskeletal: Normal range of motion in all extremities. No joint effusions.  No lower extremity tenderness nor edema. Neurologic:  Normal speech and language. No gross focal neurologic deficits are appreciated.  Skin:  Skin is warm, dry and intact. No rash noted. Psychiatric: Mood and affect are normal. Speech and behavior are normal. Patient exhibits appropriate insight and judgment.  ____________________________________________    LABS (pertinent positives/negatives)  Labs Reviewed  COMPREHENSIVE METABOLIC PANEL - Abnormal; Notable for the following:    Sodium 130 (*)    Potassium 2.6 (*)    Chloride 87 (*)    CO2 34 (*)    Glucose, Bld 185 (*)    Creatinine, Ser 1.60 (*)     Calcium 8.5 (*)    GFR calc non Af Amer 34 (*)    GFR calc Af Amer 39 (*)    All other components within normal limits  CBC - Abnormal; Notable for the following:    WBC 18.6 (*)    RDW 14.8 (*)    All other components within normal limits  LIPASE, BLOOD  TROPONIN I    ____________________________________________   EKG  I, Nance Pear, attending physician, personally viewed and interpreted this EKG  EKG Time: 1758 Rate: 98 Rhythm: normal sinus rhythm Axis: normal Intervals: qtc 474 QRS: narrow ST changes: no st elevation Impression: possible left atrial enlargement otherwise normal ekg ____________________________________________    RADIOLOGY  None   ____________________________________________   PROCEDURES  Procedure(s) performed: None  Critical Care performed: No  ____________________________________________   INITIAL IMPRESSION / ASSESSMENT AND PLAN / ED COURSE  Pertinent labs & imaging results that were available during my care of the patient were reviewed by me and considered in my medical decision making (see chart for details).  Patient presented to the emergency department today because of concerns for possible electrolyte abnormality after a couple of episodes of vomiting last night. On exam here patient's abdomen is completely benign. Blood work did show some mild hyponatremia and hypokalemia. The patient was given potassium repletion. The sodium has been improving since hospitalization. Patient was given fluids as well. No vomiting here in the emergency department. Will be discharged home with antiemetics.  ____________________________________________   FINAL CLINICAL IMPRESSION(S) / ED DIAGNOSES  Final diagnoses:  Nausea and vomiting, vomiting of unspecified type  Hypokalemia     Nance Pear, MD 10/26/15 [phone]

## 2015-10-26 NOTE — Discharge Instructions (Signed)
Please seek medical attention for any high fevers, chest pain, shortness of breath, change in behavior, persistent vomiting, bloody stool or any other new or concerning symptoms.   Nausea and Vomiting Nausea is a sick feeling that often comes before throwing up (vomiting). Vomiting is a reflex where stomach contents come out of your mouth. Vomiting can cause severe loss of body fluids (dehydration). Children and elderly adults can become dehydrated quickly, especially if they also have diarrhea. Nausea and vomiting are symptoms of a condition or disease. It is important to find the cause of your symptoms. CAUSES   Direct irritation of the stomach lining. This irritation can result from increased acid production (gastroesophageal reflux disease), infection, food poisoning, taking certain medicines (such as nonsteroidal anti-inflammatory drugs), alcohol use, or tobacco use.  Signals from the brain.These signals could be caused by a headache, heat exposure, an inner ear disturbance, increased pressure in the brain from injury, infection, a tumor, or a concussion, pain, emotional stimulus, or metabolic problems.  An obstruction in the gastrointestinal tract (bowel obstruction).  Illnesses such as diabetes, hepatitis, gallbladder problems, appendicitis, kidney problems, cancer, sepsis, atypical symptoms of a heart attack, or eating disorders.  Medical treatments such as chemotherapy and radiation.  Receiving medicine that makes you sleep (general anesthetic) during surgery. DIAGNOSIS Your caregiver may ask for tests to be done if the problems do not improve after a few days. Tests may also be done if symptoms are severe or if the reason for the nausea and vomiting is not clear. Tests may include:  Urine tests.  Blood tests.  Stool tests.  Cultures (to look for evidence of infection).  X-rays or other imaging studies. Test results can help your caregiver make decisions about treatment or the  need for additional tests. TREATMENT You need to stay well hydrated. Drink frequently but in small amounts.You may wish to drink water, sports drinks, clear broth, or eat frozen ice pops or gelatin dessert to help stay hydrated.When you eat, eating slowly may help prevent nausea.There are also some antinausea medicines that may help prevent nausea. HOME CARE INSTRUCTIONS   Take all medicine as directed by your caregiver.  If you do not have an appetite, do not force yourself to eat. However, you must continue to drink fluids.  If you have an appetite, eat a normal diet unless your caregiver tells you differently.  Eat a variety of complex carbohydrates (rice, wheat, potatoes, bread), lean meats, yogurt, fruits, and vegetables.  Avoid high-fat foods because they are more difficult to digest.  Drink enough water and fluids to keep your urine clear or pale yellow.  If you are dehydrated, ask your caregiver for specific rehydration instructions. Signs of dehydration may include:  Severe thirst.  Dry lips and mouth.  Dizziness.  Dark urine.  Decreasing urine frequency and amount.  Confusion.  Rapid breathing or pulse. SEEK IMMEDIATE MEDICAL CARE IF:   You have blood or brown flecks (like coffee grounds) in your vomit.  You have black or bloody stools.  You have a severe headache or stiff neck.  You are confused.  You have severe abdominal pain.  You have chest pain or trouble breathing.  You do not urinate at least once every 8 hours.  You develop cold or clammy skin.  You continue to vomit for longer than 24 to 48 hours.  You have a fever. MAKE SURE YOU:   Understand these instructions.  Will watch your condition.  Will get help right away  if you are not doing well or get worse.   This information is not intended to replace advice given to you by your health care provider. Make sure you discuss any questions you have with your health care provider.     Document Released: 10/14/2005 Document Revised: 01/06/2012 Document Reviewed: 03/13/2011 Elsevier Interactive Patient Education [employer].

## 2015-10-31 ENCOUNTER — Encounter: Payer: Self-pay | Admitting: Internal Medicine

## 2015-10-31 ENCOUNTER — Ambulatory Visit (INDEPENDENT_AMBULATORY_CARE_PROVIDER_SITE_OTHER): Payer: PPO | Admitting: Internal Medicine

## 2015-10-31 VITALS — BP 144/80 | HR 104 | Temp 98.2°F | Wt 158.0 lb

## 2015-10-31 DIAGNOSIS — I509 Heart failure, unspecified: Secondary | ICD-10-CM | POA: Diagnosis not present

## 2015-10-31 DIAGNOSIS — E876 Hypokalemia: Secondary | ICD-10-CM

## 2015-10-31 DIAGNOSIS — N189 Chronic kidney disease, unspecified: Secondary | ICD-10-CM | POA: Diagnosis not present

## 2015-10-31 DIAGNOSIS — E871 Hypo-osmolality and hyponatremia: Secondary | ICD-10-CM

## 2015-10-31 NOTE — Patient Instructions (Signed)
Electrolytes Test Electrolytes are minerals. They are found in your body's blood and tissues in the form of dissolved salts. Electrolytes help keep the amount of water in your body in balance. They also help move nutrients into, and waste out of, your body.  There should be a variety of electrolytes in your body. The electrolytes test, also called an electrolytes panel, usually checks levels of potassium, sodium, chloride, and bicarbonate. If these are too high or too low, it can indicate an electrolyte imbalance. This test is often done along with other tests.  Checking your level of electrolytes can help your health care provider make a diagnosis. A wide range of medical conditions can cause an electrolyte imbalance. It can be a result of:   Kidney problems.  Heart problems.  Muscle and nerve problems.  Diabetes.  Vomiting.  Diarrhea. You may have an electrolytes test as part of a routine health screening. You may also have this test if your health care provider suspects that you have an imbalance that needs to be corrected.  This test requires a blood sample taken from a vein in your arm. You may also need to provide a urine sample. PREPARATION FOR TEST Some medicines may alter certain electrolyte balances. Ask your health care provider if you need to stop taking any medicines before the test.  RESULTS It is your responsibility to obtain your test results. Ask the lab or department performing the test when and how you will get your results. Contact your health care provider to discuss any questions you have about your results.  The results of the electrolyte test will be a range of values. Electrolytes are measured in milliequivalents per liter (mEq/L) or milligrams per deciliter (mg/dL). Electrolyte levels that are above or below the normal range may indicate a problem.  Range of Normal Values Ranges for normal values vary among different labs and hospitals. You should always check with  your health care provider after having lab work or other tests done to discuss whether your values are considered within normal limits. A normal result for this test will vary, depending on the electrolyte being tested.  Meaning of Results Outside Normal Range Values If you have one or more electrolytes outside of the normal value range, your health care provider probably will try to correct the imbalance. This can be done through diet, fluid intake, and medicines. Your health care provider will analyze the results of your electrolytes test along with your results from other tests to make a diagnosis.   This information is not intended to replace advice given to you by your health care provider. Make sure you discuss any questions you have with your health care provider.   Document Released: 11/08/2004 Document Revised: 11/04/2014 Document Reviewed: 01/14/2014 Elsevier Interactive Patient Education 2016 [employer] Electrolytes Test Electrolytes are minerals. They are found in your body's blood and tissues in the form of dissolved salts. Electrolytes help keep the amount of water in your body in balance. They also help move nutrients into, and waste out of, your body.  There should be a variety of electrolytes in your body. The electrolytes test, also called an electrolytes panel, usually checks levels of potassium, sodium, chloride, and bicarbonate. If these are too high or too low, it can indicate an electrolyte imbalance. This test is often done along with other tests.  Checking your level of electrolytes can help your health care provider make a diagnosis. A wide range of medical conditions can cause an  electrolyte imbalance. It can be a result of:   Kidney problems.  Heart problems.  Muscle and nerve problems.  Diabetes.  Vomiting.  Diarrhea. You may have an electrolytes test as part of a routine health screening. You may also have this test if your health care provider suspects  that you have an imbalance that needs to be corrected.  This test requires a blood sample taken from a vein in your arm. You may also need to provide a urine sample. PREPARATION FOR TEST Some medicines may alter certain electrolyte balances. Ask your health care provider if you need to stop taking any medicines before the test.  RESULTS It is your responsibility to obtain your test results. Ask the lab or department performing the test when and how you will get your results. Contact your health care provider to discuss any questions you have about your results.  The results of the electrolyte test will be a range of values. Electrolytes are measured in milliequivalents per liter (mEq/L) or milligrams per deciliter (mg/dL). Electrolyte levels that are above or below the normal range may indicate a problem.  Range of Normal Values Ranges for normal values vary among different labs and hospitals. You should always check with your health care provider after having lab work or other tests done to discuss whether your values are considered within normal limits. A normal result for this test will vary, depending on the electrolyte being tested.  Meaning of Results Outside Normal Range Values If you have one or more electrolytes outside of the normal value range, your health care provider probably will try to correct the imbalance. This can be done through diet, fluid intake, and medicines. Your health care provider will analyze the results of your electrolytes test along with your results from other tests to make a diagnosis.   This information is not intended to replace advice given to you by your health care provider. Make sure you discuss any questions you have with your health care provider.   Document Released: 11/08/2004 Document Revised: 11/04/2014 Document Reviewed: 01/14/2014 Elsevier Interactive Patient Education [employer].

## 2015-10-31 NOTE — Progress Notes (Signed)
Pre visit review using our clinic review tool, if applicable. No additional management support is needed unless otherwise documented below in the visit note.

## 2015-10-31 NOTE — Progress Notes (Signed)
Subjective:    Patient ID: Lindsay Harmon, female    DOB: 1956/02/28, 60 y.o.   MRN: [id number]  HPI  Pt presents to the clinic today for ER followup. She was seen 12/29 for nausea and vomiting. She was concerned because she was recently admitted to the hospital 12/22 for hyponatremia, secondary to over diuresis, and she was concerned that with all the vomiting, her electrolytes may be out of whack again. She was found to be hyponatremic and hypokalemic. She was treated with IV fluids and potassium supplement in the ER and advised to have close followup with her PCP. She does have a history of CKD, most recent creatinine 1.60. She is on Torsemide for CHF. She is not on a potassium supplement, despite it being on her medication list. She denies any muscle cramps and weakness. She denies nausea and vomiting.  Review of Systems      Past Medical History  Diagnosis Date  . Depression   . GERD (gastroesophageal reflux disease)   . Hyperlipidemia   . Hypertension   . PVD (peripheral vascular disease) ([place])   . CAD (coronary artery disease)   . Familial hematuria   . NIDDM (non-insulin dependent diabetes mellitus)     with neuropathy  . IBS (irritable bowel syndrome)   . Esophageal stricture   . Obesity, unspecified   . Nephrolithiasis   . Allergy   . Arthritis   . Pulmonary hypertension ([place])     Current Outpatient Prescriptions  Medication Sig Dispense Refill  . ACCU-CHEK AVIVA PLUS test strip USE 1 STRIP DAILY TO CHECK BLOOD SUGAR 100 each 1  . albuterol (PROVENTIL HFA;VENTOLIN HFA) 108 (90 BASE) MCG/ACT inhaler Inhale 2 puffs into the lungs every 6 (six) hours as needed for wheezing or shortness of breath. 1 Inhaler 2  . ALPRAZolam (XANAX) 0.25 MG tablet TAKE ONE (1) TABLET EACH DAY 90 tablet 0  . aspirin 325 MG tablet Take 325 mg by mouth daily.      Marland Kitchen gabapentin (NEURONTIN) 300 MG capsule Take 300 mg by mouth 4 (four) times daily.    Marland Kitchen glucose blood (ACCU-CHEK AVIVA PLUS) test  strip Use to check blood sugar once daily Dx: E11.49 100 each 1  . loratadine (CLARITIN) 10 MG tablet Take 10 mg by mouth 2 (two) times daily.     Marland Kitchen lovastatin (MEVACOR) 40 MG tablet TAKE 2 TABLETS EVERY DAY 180 tablet 3  . metFORMIN (GLUCOPHAGE) 1000 MG tablet TAKE 1/2 TABLET TWICE DAILY WITH A MEAL 90 tablet 3  . methadone (DOLOPHINE) 10 MG tablet Take 2 tablets (20 mg total) by mouth 4 (four) times daily. 240 tablet 0  . metoprolol tartrate (LOPRESSOR) 25 MG tablet TAKE 1 TABLET TWICE DAILY (Patient taking differently: TAKE 1/2 TABLET TWICE DAILY) 180 tablet 3  . mirtazapine (REMERON) 7.5 MG tablet Take 1 tablet (7.5 mg total) by mouth at bedtime. (Patient taking differently: Take 3.25 mg by mouth at bedtime. ) 30 tablet 0  . ondansetron (ZOFRAN) 4 MG tablet Take 1 tablet (4 mg total) by mouth daily as needed for nausea or vomiting. 20 tablet 0  . pantoprazole (PROTONIX) 40 MG tablet TAKE 1 TABLET TWICE DAILY 180 tablet 3  . potassium chloride SA (K-DUR,KLOR-CON) 20 MEQ tablet Take 20 mEq by mouth as needed. Take 1 (20 meq) tablet when you take metolazone    . Tiotropium Bromide-Olodaterol (STIOLTO RESPIMAT) 2.5-2.5 MCG/ACT AERS Inhale 2 puffs into the lungs daily. 4 g 11  .  torsemide (DEMADEX) 20 MG tablet Take 1 tablet (20 mg total) by mouth 2 (two) times daily. 540 tablet 3   No current facility-administered medications for this visit.    No Known Allergies  Family History  Problem Relation Age of Onset  . Diabetes Mother   . Emphysema Paternal Grandfather   . Allergies Sister   . Allergies Father   . Cancer Mother     ovarian,melanoma  . Cancer Father     lung  . Cancer Maternal Grandmother     uterine    Social History   Social History  . Marital Status: Legally Separated    Spouse Name: N/A  . Number of Children: 1  . Years of Education: N/A   Occupational History  . 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 Smoking status: Former  Smoker -- 1.50 packs/day for 20 years    Types: Cigarettes    Quit date: 01/04/2009  . Smokeless tobacco: Never Used  . Alcohol Use: No     Comment: heavy in the past  . Drug Use: No  . Sexual Activity: No   Other Topics Concern  . Not on file   Social History Narrative   No living will   Son [name] should make decisions for her if she is unable.    Would accept resuscitation attempts but no prolonged ventilation   Not sure about tube feeds but probably wouldn't want them if cognitively unaware     Constitutional: Denies fever, malaise, fatigue, headache or abrupt weight changes.  Respiratory: Denies difficulty breathing, shortness of breath, cough or sputum production.   Cardiovascular: Denies chest pain, chest tightness, palpitations or swelling in the hands or feet.  Gastrointestinal: Denies abdominal pain, bloating, constipation, diarrhea or blood in the stool.   No other specific complaints in a complete review of systems (except as listed in HPI above).  Objective:   Physical Exam   BP 144/80 mmHg  Pulse 104  Temp(Src) 98.2 F (36.8 C) (Oral)  Wt 158 lb (71.668 kg)  SpO2 94% Wt Readings from Last 3 Encounters:  10/31/15 158 lb (71.668 kg)  10/26/15 150 lb (68.04 kg)  10/20/15 157 lb 12.8 oz (71.578 kg)    General: Appears her stated age, chronically ill appearing in NAD. Cardiovascular: Normal rate and rhythm. S1,S2 noted.   Pulmonary/Chest: Normal effort and diminished breath sounds. On supplemental O2. No respiratory distress. No wheezes, rales or ronchi noted.  Neurological: Alert and oriented.    BMET    Component Value Date/Time   NA 130* 10/26/2015 1743   K 2.6* 10/26/2015 1743   CL 87* 10/26/2015 1743   CO2 34* 10/26/2015 1743   GLUCOSE 185* 10/26/2015 1743   BUN 19 10/26/2015 1743   CREATININE 1.60* 10/26/2015 1743   CALCIUM 8.5* 10/26/2015 1743   GFRNONAA 34* 10/26/2015 1743   GFRAA 39* 10/26/2015 1743    Lipid Panel     Component Value  Date/Time   CHOL 107 11/08/2014 1132   TRIG 124.0 11/08/2014 1132   HDL 24.50* 11/08/2014 1132   CHOLHDL 4 11/08/2014 1132   VLDL 24.8 11/08/2014 1132   LDLCALC 58 11/08/2014 1132    CBC    Component Value Date/Time   WBC 18.6* 10/26/2015 1743   RBC 4.33 10/26/2015 1743   HGB 12.0 10/26/2015 1743   HCT 36.2 10/26/2015 1743   PLT 210 10/26/2015 1743   MCV 83.5 10/26/2015 1743  MCH 27.8 10/26/2015 1743   MCHC 33.3 10/26/2015 1743   RDW 14.8* 10/26/2015 1743   LYMPHSABS 2.4 06/03/2015 0559   MONOABS 0.7 06/03/2015 0559   EOSABS 0.3 06/03/2015 0559   BASOSABS 0.0 06/03/2015 0559    Hgb A1C Lab Results  Component Value Date   HGBA1C 8.3* 10/18/2015        Assessment & Plan:   ER follow up for hypnatremia and hypokalemia:  ER notes, labs and ECG reviewed Previous hospitalization reviewed She is asymptomatic today She has a follow up with the nephrologist tomorrow, they are drawing labs, will request that they draw a BMET and fax a copy of the results.  RTC as needed

## 2015-11-01 DIAGNOSIS — E1122 Type 2 diabetes mellitus with diabetic chronic kidney disease: Secondary | ICD-10-CM | POA: Diagnosis not present

## 2015-11-01 DIAGNOSIS — N183 Chronic kidney disease, stage 3 (moderate): Secondary | ICD-10-CM | POA: Diagnosis not present

## 2015-11-01 DIAGNOSIS — E871 Hypo-osmolality and hyponatremia: Secondary | ICD-10-CM | POA: Diagnosis not present

## 2015-11-01 DIAGNOSIS — E876 Hypokalemia: Secondary | ICD-10-CM | POA: Diagnosis not present

## 2015-11-07 DIAGNOSIS — J449 Chronic obstructive pulmonary disease, unspecified: Secondary | ICD-10-CM | POA: Diagnosis not present

## 2015-11-07 DIAGNOSIS — I509 Heart failure, unspecified: Secondary | ICD-10-CM | POA: Diagnosis not present

## 2015-11-08 DIAGNOSIS — N183 Chronic kidney disease, stage 3 (moderate): Secondary | ICD-10-CM | POA: Diagnosis not present

## 2015-11-10 ENCOUNTER — Other Ambulatory Visit: Payer: Self-pay | Admitting: *Deleted

## 2015-11-10 ENCOUNTER — Other Ambulatory Visit: Payer: Self-pay

## 2015-11-10 ENCOUNTER — Ambulatory Visit (INDEPENDENT_AMBULATORY_CARE_PROVIDER_SITE_OTHER): Payer: PPO | Admitting: Family Medicine

## 2015-11-10 ENCOUNTER — Encounter: Payer: Self-pay | Admitting: Family Medicine

## 2015-11-10 VITALS — BP 120/76 | HR 113 | Temp 98.4°F | Ht 62.0 in | Wt 160.2 lb

## 2015-11-10 DIAGNOSIS — J019 Acute sinusitis, unspecified: Secondary | ICD-10-CM | POA: Insufficient documentation

## 2015-11-10 DIAGNOSIS — J0100 Acute maxillary sinusitis, unspecified: Secondary | ICD-10-CM

## 2015-11-10 MED ORDER — METHADONE HCL 10 MG PO TABS: 20.0000 mg | ORAL_TABLET | Freq: Four times a day (QID) | ORAL | Status: DC

## 2015-11-10 MED ORDER — AMOXICILLIN 500 MG PO CAPS: 1000.0000 mg | ORAL_CAPSULE | Freq: Two times a day (BID) | ORAL | Status: DC

## 2015-11-10 MED ORDER — ONETOUCH ULTRA SYSTEM W/DEVICE KIT: PACK | Status: DC

## 2015-11-10 MED ORDER — GLUCOSE BLOOD VI STRP: ORAL_STRIP | Status: DC

## 2015-11-10 NOTE — Telephone Encounter (Signed)
Gave to patient in person while she was here seeing Dr. Diona Browner

## 2015-11-10 NOTE — Progress Notes (Signed)
   Subjective:    Patient ID: Lindsay Harmon, female    DOB: October 05, 1956, 60 y.o.   MRN: [id number]  Sinusitis This is a new problem. The current episode started in the past 7 days. The problem has been gradually worsening since onset. There has been no fever. The pain is mild. Associated symptoms include congestion, a hoarse voice, sinus pressure and a sore throat. Pertinent negatives include no coughing or ear pain. (Nasal discharge brownish yellow Ear fulllness  Some increase in SOB , no wheezing.. She does not feel like COPD is acting up.) Past treatments include nothing. The treatment provided mild relief.   No recent antibiotics. Use claritin.  Social History /Family History/Past Medical History reviewed and updated if needed. She is on continuous oxygen for COPD. Allergic rhinitis, pulmonar y hypertension.  Review of Systems  HENT: Positive for congestion, hoarse voice, sinus pressure and sore throat. Negative for ear pain.   Respiratory: Negative for cough.        Objective:   Physical Exam  Constitutional: Vital signs are normal. She appears well-developed and well-nourished. She is cooperative.  Non-toxic appearance. She does not appear ill. No distress.  HENT:  Head: Normocephalic.  Right Ear: Hearing, tympanic membrane, external ear and ear canal normal. Tympanic membrane is not erythematous, not retracted and not bulging. No middle ear effusion.  Left Ear: Hearing, tympanic membrane, external ear and ear canal normal. Tympanic membrane is not erythematous, not retracted and not bulging.  No middle ear effusion.  Nose: Mucosal edema and rhinorrhea present. Right sinus exhibits no maxillary sinus tenderness and no frontal sinus tenderness. Left sinus exhibits no maxillary sinus tenderness and no frontal sinus tenderness.  Mouth/Throat: Uvula is midline, oropharynx is clear and moist and mucous membranes are normal.  Dried blood in left nostril  Eyes: Conjunctivae, EOM and  lids are normal. Pupils are equal, round, and reactive to light. Lids are everted and swept, no foreign bodies found.  Neck: Trachea normal and normal range of motion. Neck supple. Carotid bruit is not present. No thyroid mass and no thyromegaly present.  Cardiovascular: Normal rate, regular rhythm, S1 normal, S2 normal, normal heart sounds, intact distal pulses and normal pulses.  Exam reveals no gallop and no friction rub.   No murmur heard. Pulmonary/Chest: Effort normal. No tachypnea. No respiratory distress. She has decreased breath sounds. She has no wheezes. She has no rhonchi. She has no rales.  On oxygen.  Neurological: She is alert.  Skin: Skin is warm, dry and intact. No rash noted.  Psychiatric: Her speech is normal and behavior is normal. Judgment normal. Her mood appears not anxious. Cognition and memory are normal. She does not exhibit a depressed mood.          Assessment & Plan:

## 2015-11-10 NOTE — Progress Notes (Signed)
Pre visit review using our clinic review tool, if applicable. No additional management support is needed unless otherwise documented below in the visit note.

## 2015-11-10 NOTE — Patient Instructions (Addendum)
Complete 10 days of amoxicillin.  Start nasal saline spray OTC 2 sprays per nostril 2-3 times a day. Start nasal flonase OTC 2 spray per nostril once daily.  Can use a humidifier as long as keeping clean. Call if not improving in 5-7 days. Call sooner if fever > 101.4 on antibiotics or shortness of breath increasing.

## 2015-11-10 NOTE — Telephone Encounter (Signed)
Pt left v/m requesting rx methadone. Call when ready for pick up. Last printed # [phone] on 10/10/15; pt last seen 10/18/15.

## 2015-11-10 NOTE — Assessment & Plan Note (Signed)
High risk patient. Will treat with antibiotics. No clear involvement at this time of her COPD.  Start nasal saline and nasal steroid.

## 2015-11-14 DIAGNOSIS — E1122 Type 2 diabetes mellitus with diabetic chronic kidney disease: Secondary | ICD-10-CM | POA: Diagnosis not present

## 2015-11-14 DIAGNOSIS — E871 Hypo-osmolality and hyponatremia: Secondary | ICD-10-CM | POA: Diagnosis not present

## 2015-11-14 DIAGNOSIS — N183 Chronic kidney disease, stage 3 (moderate): Secondary | ICD-10-CM | POA: Diagnosis not present

## 2015-11-14 DIAGNOSIS — I10 Essential (primary) hypertension: Secondary | ICD-10-CM | POA: Diagnosis not present

## 2015-11-28 ENCOUNTER — Telehealth (HOSPITAL_COMMUNITY): Payer: Self-pay

## 2015-11-28 DIAGNOSIS — E871 Hypo-osmolality and hyponatremia: Secondary | ICD-10-CM | POA: Diagnosis not present

## 2015-11-28 DIAGNOSIS — E1122 Type 2 diabetes mellitus with diabetic chronic kidney disease: Secondary | ICD-10-CM | POA: Diagnosis not present

## 2015-11-28 DIAGNOSIS — I10 Essential (primary) hypertension: Secondary | ICD-10-CM | POA: Diagnosis not present

## 2015-11-28 DIAGNOSIS — N183 Chronic kidney disease, stage 3 (moderate): Secondary | ICD-10-CM | POA: Diagnosis not present

## 2015-11-28 NOTE — Telephone Encounter (Signed)
Patient ask for guidance with medication Her kidney doctor took her completely off Metolazone because of her sodium dropping  Her kidney doctor also decreased her lasix from 60 mg twice a day to 20 mg twice a day because her potasium was too low Now she is beginning to feel full; feels more short of breath then normal. Her baseline weight was 152 lbs at home until 1/16 when she went up to 156 lbs Her weight on 1/22 was 159 lbs. She took a Metolazone. Weight 1/23 158.6 Her weight today is 157.4 lbs  She said she always followed Dr. Clayborne Dana instructions on taking the Metolazone but now she is at a loss at what to when she has a weight gain Has an OV for Thursday in the clinic

## 2015-11-30 ENCOUNTER — Ambulatory Visit (HOSPITAL_COMMUNITY)
Admission: RE | Admit: 2015-11-30 | Discharge: 2015-11-30 | Disposition: A | Discharge status: Home / Self Care | Payer: PPO | Source: Ambulatory Visit | Attending: Internal Medicine | Admitting: Internal Medicine

## 2015-11-30 VITALS — BP 104/58 | HR 95 | Wt 159.8 lb

## 2015-11-30 DIAGNOSIS — Z87891 Personal history of nicotine dependence: Secondary | ICD-10-CM | POA: Diagnosis not present

## 2015-11-30 DIAGNOSIS — Z7982 Long term (current) use of aspirin: Secondary | ICD-10-CM | POA: Insufficient documentation

## 2015-11-30 DIAGNOSIS — N184 Chronic kidney disease, stage 4 (severe): Secondary | ICD-10-CM

## 2015-11-30 DIAGNOSIS — K219 Gastro-esophageal reflux disease without esophagitis: Secondary | ICD-10-CM | POA: Diagnosis not present

## 2015-11-30 DIAGNOSIS — E1140 Type 2 diabetes mellitus with diabetic neuropathy, unspecified: Secondary | ICD-10-CM | POA: Diagnosis not present

## 2015-11-30 DIAGNOSIS — I2781 Cor pulmonale (chronic): Secondary | ICD-10-CM | POA: Diagnosis not present

## 2015-11-30 DIAGNOSIS — K589 Irritable bowel syndrome without diarrhea: Secondary | ICD-10-CM | POA: Insufficient documentation

## 2015-11-30 DIAGNOSIS — I739 Peripheral vascular disease, unspecified: Secondary | ICD-10-CM | POA: Insufficient documentation

## 2015-11-30 DIAGNOSIS — Z833 Family history of diabetes mellitus: Secondary | ICD-10-CM | POA: Insufficient documentation

## 2015-11-30 DIAGNOSIS — Z79899 Other long term (current) drug therapy: Secondary | ICD-10-CM | POA: Insufficient documentation

## 2015-11-30 DIAGNOSIS — J449 Chronic obstructive pulmonary disease, unspecified: Secondary | ICD-10-CM | POA: Insufficient documentation

## 2015-11-30 DIAGNOSIS — Z9981 Dependence on supplemental oxygen: Secondary | ICD-10-CM | POA: Insufficient documentation

## 2015-11-30 DIAGNOSIS — I130 Hypertensive heart and chronic kidney disease with heart failure and stage 1 through stage 4 chronic kidney disease, or unspecified chronic kidney disease: Secondary | ICD-10-CM | POA: Diagnosis not present

## 2015-11-30 DIAGNOSIS — I2510 Atherosclerotic heart disease of native coronary artery without angina pectoris: Secondary | ICD-10-CM | POA: Diagnosis not present

## 2015-11-30 DIAGNOSIS — E871 Hypo-osmolality and hyponatremia: Secondary | ICD-10-CM | POA: Insufficient documentation

## 2015-11-30 DIAGNOSIS — J9610 Chronic respiratory failure, unspecified whether with hypoxia or hypercapnia: Secondary | ICD-10-CM | POA: Diagnosis not present

## 2015-11-30 DIAGNOSIS — I272 Other secondary pulmonary hypertension: Secondary | ICD-10-CM | POA: Diagnosis not present

## 2015-11-30 DIAGNOSIS — N189 Chronic kidney disease, unspecified: Secondary | ICD-10-CM | POA: Insufficient documentation

## 2015-11-30 DIAGNOSIS — Z7984 Long term (current) use of oral hypoglycemic drugs: Secondary | ICD-10-CM | POA: Insufficient documentation

## 2015-11-30 DIAGNOSIS — I5032 Chronic diastolic (congestive) heart failure: Secondary | ICD-10-CM

## 2015-11-30 DIAGNOSIS — E785 Hyperlipidemia, unspecified: Secondary | ICD-10-CM | POA: Insufficient documentation

## 2015-11-30 DIAGNOSIS — E1122 Type 2 diabetes mellitus with diabetic chronic kidney disease: Secondary | ICD-10-CM | POA: Diagnosis not present

## 2015-11-30 DIAGNOSIS — I2721 Secondary pulmonary arterial hypertension: Secondary | ICD-10-CM

## 2015-11-30 LAB — BASIC METABOLIC PANEL
ANION GAP: 14 (ref 5–15)
BUN: 26 mg/dL — ABNORMAL HIGH (ref 6–20)
CALCIUM: 9.1 mg/dL (ref 8.9–10.3)
CO2: 28 mmol/L (ref 22–32)
Chloride: 91 mmol/L — ABNORMAL LOW (ref 101–111)
Creatinine, Ser: 2.23 mg/dL — ABNORMAL HIGH (ref 0.44–1.00)
GFR, EST AFRICAN AMERICAN: 26 mL/min — AB (ref >60)
GFR, EST NON AFRICAN AMERICAN: 23 mL/min — AB (ref >60)
Glucose, Bld: 287 mg/dL — ABNORMAL HIGH (ref 65–99)
POTASSIUM: 4 mmol/L (ref 3.5–5.1)
Sodium: 133 mmol/L — ABNORMAL LOW (ref 135–145)

## 2015-11-30 MED ORDER — TORSEMIDE 20 MG PO TABS: ORAL_TABLET | ORAL | Status: DC

## 2015-11-30 MED ORDER — METOLAZONE 2.5 MG PO TABS: 2.5000 mg | ORAL_TABLET | ORAL | Status: DC | PRN

## 2015-11-30 NOTE — Patient Instructions (Signed)
Increase Torsemide to 40 mg (2 tabs) in AM and 20 mg (1 tab) in PM  Take Metolazone if weight is 156 lb or greater  Labs today  Your physician recommends that you schedule a follow-up appointment in: 4 weeks

## 2015-11-30 NOTE — Addendum Note (Signed)
Encounter addended by: Scarlette Calico, RN on: 11/30/2015 10:32 AM<BR>     Documentation filed: [employer], Patient Instructions Section, Orders

## 2015-11-30 NOTE — Progress Notes (Signed)
ADVANCED HF CLINIC NOTE  Patient ID: Lindsay Harmon, female   DOB: 10-May-1956, 60 y.o.   MRN: [id number]  PCP: Dr Silvio Pate  Primary HF  Cardiologist: Dr Haroldine Laws Pulmonary: [name]   HPI: Lindsay Harmon is a 60 year old with history of HTN, DM, IBS, depression, GERD, severe COPD (FEV1 0.87L)  and pulmonary hypertension with RV failure/cor pulmonale, and chronic diastolic heart failure. .  Admitted in 8/16 with volume overload and severe hyponatremia. Diuresed with IV lasix and transtioned back torsemide and metolazone as needed. Discharge weight 153 pounds.   Admitted to [facility] in 12/16 with volume depletion and hyponatremia with Na 113. Hydrated. Has since followed up with Dr. Anthonette Legato in Renal. Now on torsemide 20 bid and occasional metolazone.   She is here for f/u. Says she is a bit more SOB than previous. Weight at home now 155-158. Says she breathes better when weight 150-154 Volume status was good. Dry weight felt to be 153-155. Marland Kitchen Uses 2 liters [place] in the day time and 3 liters at bed time. Taking all medications. Limiting fluid intake to < 2 liters and following low salt diet. Poor appetite.   RHC 12/05/2014 RA 12 mmHg (mean) with O2 sat 72%; RV 97/16 mmHg with O2 sat 73%; PA 97/30 mmHg with O2 sat 67%; PCWP(mean) 14 mmHg (mean); Cardiac Output 5.78 L/min  PFTs 2/92016 with severe COPD  FEV1 0.87 (38%) FVC 1.41 (48%) FEF 25-75 0.41 (19%) Unable to do DLCO  Echo (2/16) EF 60-65%, aortic sclerosis, RV moderately dilated/moderately decreased systolic function, PASP 71 mmHg  06/14/15 K 4.6 Creatinine 2.00   ROS: All systems negative except as listed in HPI, PMH and Problem List.  SH:  Social History   Social History  . Marital Status: Legally Separated    Spouse Name: N/A  . Number of Children: 1  . Years of Education: N/A   Occupational History  . 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 Smoking status: Former Smoker -- 1.50 packs/day  for 20 years    Types: Cigarettes    Quit date: 01/04/2009  . Smokeless tobacco: Never Used  . Alcohol Use: No     Comment: heavy in the past  . Drug Use: No  . Sexual Activity: No   Other Topics Concern  . Not on file   Social History Narrative   No living will   Son [name] should make decisions for her if she is unable.    Would accept resuscitation attempts but no prolonged ventilation   Not sure about tube feeds but probably wouldn't want them if cognitively unaware    FH:  Family History  Problem Relation Age of Onset  . Diabetes Mother   . Emphysema Paternal Grandfather   . Allergies Sister   . Allergies Father   . Cancer Mother     ovarian,melanoma  . Cancer Father     lung  . Cancer Maternal Grandmother     uterine    Past Medical History  Diagnosis Date  . Depression   . GERD (gastroesophageal reflux disease)   . Hyperlipidemia   . Hypertension   . PVD (peripheral vascular disease) ([place])   . CAD (coronary artery disease)   . Familial hematuria   . NIDDM (non-insulin dependent diabetes mellitus)     with neuropathy  . IBS (irritable bowel syndrome)   . Esophageal stricture   . Obesity, unspecified   .  Nephrolithiasis   . Allergy   . Arthritis   . Pulmonary hypertension ([place])     Current Outpatient Prescriptions  Medication Sig Dispense Refill  . albuterol (PROVENTIL HFA;VENTOLIN HFA) 108 (90 BASE) MCG/ACT inhaler Inhale 2 puffs into the lungs every 6 (six) hours as needed for wheezing or shortness of breath. 1 Inhaler 2  . ALPRAZolam (XANAX) 0.25 MG tablet TAKE ONE (1) TABLET EACH DAY 90 tablet 0  . aspirin 325 MG tablet Take 325 mg by mouth daily.      . Blood Glucose Monitoring Suppl (ONE TOUCH ULTRA SYSTEM KIT) w/Device KIT Use to test blood sugar once daily dx: E11.49 E11.51 1 each 0  . gabapentin (NEURONTIN) 300 MG capsule 3 (three) times daily. Take 1 cap AM and NOON and 2 caps in PM    . glucose blood (ONE TOUCH TEST STRIPS) test strip Use  to test blood sugar once daily dx: E11.49 E11.51 100 each 1  . loratadine (CLARITIN) 10 MG tablet Take 10 mg by mouth 2 (two) times daily.     Marland Kitchen lovastatin (MEVACOR) 40 MG tablet TAKE 2 TABLETS EVERY DAY 180 tablet 3  . metFORMIN (GLUCOPHAGE) 1000 MG tablet TAKE 1/2 TABLET TWICE DAILY WITH A MEAL 90 tablet 3  . methadone (DOLOPHINE) 10 MG tablet Take 2 tablets (20 mg total) by mouth 4 (four) times daily. 240 tablet 0  . metolazone (ZAROXOLYN) 2.5 MG tablet Take 2.5 mg by mouth as needed.    . metoprolol tartrate (LOPRESSOR) 25 MG tablet Take 12.5 mg by mouth 2 (two) times daily.    . mirtazapine (REMERON) 7.5 MG tablet Take 1 tablet (7.5 mg total) by mouth at bedtime. (Patient taking differently: Take 3.25 mg by mouth at bedtime. ) 30 tablet 0  . nitroGLYCERIN (NITROSTAT) 0.4 MG SL tablet Place 0.4 mg under the tongue every 5 (five) minutes as needed for chest pain.    Marland Kitchen ondansetron (ZOFRAN) 4 MG tablet Take 1 tablet (4 mg total) by mouth daily as needed for nausea or vomiting. 20 tablet 0  . pantoprazole (PROTONIX) 40 MG tablet TAKE 1 TABLET TWICE DAILY 180 tablet 3  . potassium chloride SA (K-DUR,KLOR-CON) 20 MEQ tablet Take 20 mEq by mouth as needed. Take 1 (20 meq) tablet when you take metolazone    . Tiotropium Bromide-Olodaterol (STIOLTO RESPIMAT) 2.5-2.5 MCG/ACT AERS Inhale 2 puffs into the lungs daily. 4 g 11  . torsemide (DEMADEX) 20 MG tablet Take 1 tablet (20 mg total) by mouth 2 (two) times daily. 540 tablet 3   No current facility-administered medications for this encounter.    Filed Vitals:   11/30/15 0959  BP: 104/58  Pulse: 95  Weight: 159 lb 12 oz (72.462 kg)  SpO2: 95%    PHYSICAL EXAM:  General:  Sitting one exam Wearing O2 No resp difficulty. Sats initially 80% on walking with O2 HEENT: normal Neck: supple. JVP 7. Carotids 2+ bilaterally; no bruits. No lymphadenopathy or thryomegaly appreciated. Cor: PMI normal. Regular rate & rhythm. No rubs, gallops or  murmurs. Lungs: clear on 2 liters [place] with markedly reduced BS throughout Abdomen: obese, soft, nontender, nondistended. No hepatosplenomegaly. No bruits or masses. Good bowel sounds. Extremities: no cyanosis, clubbing, rash, 1+ edema R>L  Neuro: alert & orientedx3, cranial nerves grossly intact. Moves all 4 extremities w/o difficulty. Affect pleasant.   ASSESSMENT & PLAN: 1. Chronic Diastolic HF- NYHA III. Volume status mildly elevated as diuretics cut back due to hyponatremia. Dry weight really  better at 153-155 pounds. Will increase torsemide to 40/20 and take metolazone when weight 156 or greater buy not more than 2x/week -will follow sodium closely. If getting low again. Can consider demeclocycline or occasional tolvaptan  --Discussed with Dr. Holley Raring who will co-manage - Check BMET now.  2. PAH with cor pulmonale  - likely Who Group 3. Not candidate for selective pulmonary vasodilators 3. Severe COPD- Followed by [name] - O2 sats drop on exertion. She has pulse ox and I instructed her to use that to adjust O2 as needed to keep sats >=90% 4. Chronic respiratory failure- on 2 liters nasal cannula .  5. Hyponatremia 6. CKD (baseline cr ~2.0)- Check BMET now.   Follow up in 4 weeks.   Glori Bickers MD  10:15 AM

## 2015-12-01 ENCOUNTER — Other Ambulatory Visit (HOSPITAL_COMMUNITY): Payer: Self-pay | Admitting: Internal Medicine

## 2015-12-01 DIAGNOSIS — I5022 Chronic systolic (congestive) heart failure: Secondary | ICD-10-CM

## 2015-12-04 ENCOUNTER — Encounter: Payer: Self-pay | Admitting: Internal Medicine

## 2015-12-04 ENCOUNTER — Ambulatory Visit (INDEPENDENT_AMBULATORY_CARE_PROVIDER_SITE_OTHER): Payer: PPO | Admitting: Internal Medicine

## 2015-12-04 ENCOUNTER — Telehealth: Payer: Self-pay | Admitting: Internal Medicine

## 2015-12-04 VITALS — BP 148/80 | HR 87 | Temp 98.3°F | Resp 12 | Wt 159.0 lb

## 2015-12-04 DIAGNOSIS — E1151 Type 2 diabetes mellitus with diabetic peripheral angiopathy without gangrene: Secondary | ICD-10-CM

## 2015-12-04 DIAGNOSIS — E1165 Type 2 diabetes mellitus with hyperglycemia: Secondary | ICD-10-CM

## 2015-12-04 DIAGNOSIS — J0111 Acute recurrent frontal sinusitis: Secondary | ICD-10-CM | POA: Diagnosis not present

## 2015-12-04 DIAGNOSIS — IMO0002 Reserved for concepts with insufficient information to code with codable children: Secondary | ICD-10-CM

## 2015-12-04 DIAGNOSIS — N184 Chronic kidney disease, stage 4 (severe): Secondary | ICD-10-CM | POA: Diagnosis not present

## 2015-12-04 MED ORDER — AMOXICILLIN-POT CLAVULANATE 875-125 MG PO TABS: 1.0000 | ORAL_TABLET | Freq: Two times a day (BID) | ORAL | Status: DC

## 2015-12-04 NOTE — Progress Notes (Signed)
Pre visit review using our clinic review tool, if applicable. No additional management support is needed unless otherwise documented below in the visit note.

## 2015-12-04 NOTE — Telephone Encounter (Signed)
Okay --will evaluate at the [facility]

## 2015-12-04 NOTE — Assessment & Plan Note (Signed)
Last creatinine up to 2.2 Dr Sung Amabile is coordinating with Dr Sallee Lange Will get repeat blood work next week here

## 2015-12-04 NOTE — Progress Notes (Signed)
Subjective:    Patient ID: Lindsay Harmon, female    DOB: May 08, 1956, 60 y.o.   MRN: [id number]  HPI Here for respiratory symptoms Seen 4 weeks ago for sinusitis---improved a bit with the amoxil but never resolved  Now throat is worsening No cough Having post nasal drip---bothering her throat (especially in AM) No headache No fever Breathing is about the same  No obvious fever No sweats or chills at night  Using saline nasal spray and flonase  Current Outpatient Prescriptions on File Prior to Visit  Medication Sig Dispense Refill  . albuterol (PROVENTIL HFA;VENTOLIN HFA) 108 (90 BASE) MCG/ACT inhaler Inhale 2 puffs into the lungs every 6 (six) hours as needed for wheezing or shortness of breath. 1 Inhaler 2  . ALPRAZolam (XANAX) 0.25 MG tablet TAKE ONE (1) TABLET EACH DAY 90 tablet 0  . aspirin 325 MG tablet Take 325 mg by mouth daily.      . Blood Glucose Monitoring Suppl (ONE TOUCH ULTRA SYSTEM KIT) w/Device KIT Use to test blood sugar once daily dx: E11.49 E11.51 1 each 0  . gabapentin (NEURONTIN) 300 MG capsule 3 (three) times daily. Take 1 cap AM and NOON and 2 caps in PM    . glucose blood (ONE TOUCH TEST STRIPS) test strip Use to test blood sugar once daily dx: E11.49 E11.51 100 each 1  . loratadine (CLARITIN) 10 MG tablet Take 10 mg by mouth 2 (two) times daily.     Marland Kitchen lovastatin (MEVACOR) 40 MG tablet TAKE 2 TABLETS EVERY DAY 180 tablet 3  . metFORMIN (GLUCOPHAGE) 1000 MG tablet TAKE 1/2 TABLET TWICE DAILY WITH A MEAL 90 tablet 3  . methadone (DOLOPHINE) 10 MG tablet Take 2 tablets (20 mg total) by mouth 4 (four) times daily. 240 tablet 0  . metolazone (ZAROXOLYN) 2.5 MG tablet Take 1 tablet (2.5 mg total) by mouth as needed (for wt 156 lb or greater).    . metoprolol tartrate (LOPRESSOR) 25 MG tablet Take 12.5 mg by mouth 2 (two) times daily.    . mirtazapine (REMERON) 7.5 MG tablet Take 1 tablet (7.5 mg total) by mouth at bedtime. (Patient taking differently: Take 3.25  mg by mouth at bedtime. ) 30 tablet 0  . nitroGLYCERIN (NITROSTAT) 0.4 MG SL tablet Place 0.4 mg under the tongue every 5 (five) minutes as needed for chest pain.    Marland Kitchen ondansetron (ZOFRAN) 4 MG tablet Take 1 tablet (4 mg total) by mouth daily as needed for nausea or vomiting. 20 tablet 0  . pantoprazole (PROTONIX) 40 MG tablet TAKE 1 TABLET TWICE DAILY 180 tablet 3  . potassium chloride SA (K-DUR,KLOR-CON) 20 MEQ tablet Take 20 mEq by mouth as needed. Take 1 (20 meq) tablet when you take metolazone    . Tiotropium Bromide-Olodaterol (STIOLTO RESPIMAT) 2.5-2.5 MCG/ACT AERS Inhale 2 puffs into the lungs daily. 4 g 11  . torsemide (DEMADEX) 20 MG tablet Take 2 tabs in AM and 1 tab in PM 540 tablet 3   No current facility-administered medications on file prior to visit.    No Known Allergies  Past Medical History  Diagnosis Date  . Depression   . GERD (gastroesophageal reflux disease)   . Hyperlipidemia   . Hypertension   . PVD (peripheral vascular disease) ([place])   . CAD (coronary artery disease)   . Familial hematuria   . NIDDM (non-insulin dependent diabetes mellitus)     with neuropathy  . IBS (irritable bowel syndrome)   .  Esophageal stricture   . Obesity, unspecified   . Nephrolithiasis   . Allergy   . Arthritis   . Pulmonary hypertension [facility])     Past Surgical History  Procedure Laterality Date  . Abdominal hysterectomy    . Cesarean section    . Carpal tunnel release    . Right heart catheterization N/A 12/05/2014    Procedure: RIGHT HEART CATH;  Surgeon: Sinclair Grooms, MD;  Location: [facility] CATH LAB;  Service: Cardiovascular;  Laterality: N/A;    Family History  Problem Relation Age of Onset  . Diabetes Mother   . Emphysema Paternal Grandfather   . Allergies Sister   . Allergies Father   . Cancer Mother     ovarian,melanoma  . Cancer Father     lung  . Cancer Maternal Grandmother     uterine    Social History   Social History  . Marital Status: Legally  Separated    Spouse Name: N/A  . Number of Children: 1  . Years of Education: N/A   Occupational History  . 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 Smoking status: Former Smoker -- 1.50 packs/day for 20 years    Types: Cigarettes    Quit date: 01/04/2009  . Smokeless tobacco: Never Used  . Alcohol Use: No     Comment: heavy in the past  . Drug Use: No  . Sexual Activity: No   Other Topics Concern  . Not on file   Social History Narrative   No living will   Son [name] should make decisions for her if she is unable.    Would accept resuscitation attempts but no prolonged ventilation   Not sure about tube feeds but probably wouldn't want them if cognitively unaware   Review of Systems  No rash No vomiting or diarrhea Appetite is okay but her sense of taste is off Sugars are elevated since sick--- 200-300 lately fasting      Objective:   Physical Exam  Constitutional: She appears well-developed. No distress.  HENT:  Mild maxillary tenderness Mild nasal inflammation TMs okay Slight pharyngeal injection  Neck: Normal range of motion. Neck supple.  Pulmonary/Chest: Effort normal and breath sounds normal. No respiratory distress. She has no wheezes. She has no rales.  Lymphadenopathy:    She has no cervical adenopathy.          Assessment & Plan:

## 2015-12-04 NOTE — Assessment & Plan Note (Signed)
Never quite got better after last month Will try augmentin instead

## 2015-12-04 NOTE — Telephone Encounter (Signed)
Pt has appt with Dr Silvio Pate 12/04/15 at 4:30 pm.

## 2015-12-04 NOTE — Assessment & Plan Note (Signed)
Running higher since sick Would not increase meds yet--but if stays up when over the acute infection, will need to consider increased meds

## 2015-12-04 NOTE — Telephone Encounter (Signed)
Patient Name: ANASTAZIA CREEK DOB: 11/02/1955 Initial Comment Caller states her blood sugar was 323 this morning, has sinus infection and sore throat also Nurse Assessment Nurse: Ronnald Ramp, RN, Miranda Date/Time (Eastern Time): 12/04/2015 9:36:38 AM Confirm and document reason for call. If symptomatic, describe symptoms. You must click the next button to save text entered. ---Caller states she was treated for sinus infection 3 weeks ago but still having symptoms. Also with a sore throat for the last 4 days. Her BS has been going up during the last 3 weeks, today it is 323. Has the patient traveled out of the country within the last 30 days? ---No Does the patient have any new or worsening symptoms? ---Yes Will a triage be completed? ---Yes Related visit to physician within the last 2 weeks? ---No Does the PT have any chronic conditions? (i.e. diabetes, asthma, etc.) ---Yes List chronic conditions. ---Diabetes, On O2, heart issues Is this a behavioral health or substance abuse call? ---No Guidelines Guideline Title Affirmed Question Affirmed Notes Sinus Pain or Congestion [1] Sinus congestion (pressure, fullness) AND [2] present > 10 days Diabetes - High Blood Sugar [1] Blood glucose > 300 mg/dl (16.5 mmol/ l) AND [2] two or more times in a row Final Disposition User Call PCP Now Ronnald Ramp, RN, Miranda Comments She already has an appt at 4:30pm with Dr. Silvio Pate. Referrals REFERRED TO PCP OFFICE Disagree/Comply: Comply Disagree/Comply: Comply

## 2015-12-08 ENCOUNTER — Other Ambulatory Visit: Payer: Self-pay

## 2015-12-08 DIAGNOSIS — I509 Heart failure, unspecified: Secondary | ICD-10-CM | POA: Diagnosis not present

## 2015-12-08 DIAGNOSIS — J449 Chronic obstructive pulmonary disease, unspecified: Secondary | ICD-10-CM | POA: Diagnosis not present

## 2015-12-08 NOTE — Telephone Encounter (Signed)
Pt left v/m requesting rx methadone. Call when ready for pick up. rx last printed # [phone] on 11/10/15. Last seen 12/04/15.

## 2015-12-09 NOTE — Telephone Encounter (Signed)
Approved: okay to prepare Rx for me to sign

## 2015-12-11 ENCOUNTER — Other Ambulatory Visit (INDEPENDENT_AMBULATORY_CARE_PROVIDER_SITE_OTHER): Payer: PPO

## 2015-12-11 DIAGNOSIS — I5022 Chronic systolic (congestive) heart failure: Secondary | ICD-10-CM | POA: Diagnosis not present

## 2015-12-11 LAB — BASIC METABOLIC PANEL
BUN: 25 mg/dL — AB (ref 6–23)
CALCIUM: 9.5 mg/dL (ref 8.4–10.5)
CO2: 32 meq/L (ref 19–32)
CREATININE: 2.01 mg/dL — AB (ref 0.40–1.20)
Chloride: 86 mEq/L — ABNORMAL LOW (ref 96–112)
GFR: 26.68 mL/min — ABNORMAL LOW (ref >60.00)
GLUCOSE: 227 mg/dL — AB (ref 70–99)
Potassium: 3.8 mEq/L (ref 3.5–5.1)
SODIUM: 128 meq/L — AB (ref 135–145)

## 2015-12-11 MED ORDER — METHADONE HCL 10 MG PO TABS: 20.0000 mg | ORAL_TABLET | Freq: Four times a day (QID) | ORAL | Status: DC

## 2015-12-11 NOTE — Telephone Encounter (Signed)
Spoke with patient and advised rx ready for pick-up and it will be at the front desk.  

## 2015-12-12 DIAGNOSIS — E871 Hypo-osmolality and hyponatremia: Secondary | ICD-10-CM | POA: Diagnosis not present

## 2015-12-12 DIAGNOSIS — E1122 Type 2 diabetes mellitus with diabetic chronic kidney disease: Secondary | ICD-10-CM | POA: Diagnosis not present

## 2015-12-12 DIAGNOSIS — N184 Chronic kidney disease, stage 4 (severe): Secondary | ICD-10-CM | POA: Diagnosis not present

## 2015-12-12 DIAGNOSIS — N2581 Secondary hyperparathyroidism of renal origin: Secondary | ICD-10-CM | POA: Diagnosis not present

## 2015-12-12 DIAGNOSIS — I10 Essential (primary) hypertension: Secondary | ICD-10-CM | POA: Diagnosis not present

## 2015-12-12 LAB — MICROALBUMIN, URINE

## 2015-12-21 ENCOUNTER — Telehealth: Payer: Self-pay

## 2015-12-21 NOTE — Telephone Encounter (Signed)
Pt left v/m; pt was seen 12/04/15 with sinus infection and elevated BS; pt advised after sinus infection clears if continues with elevated BS may adjust meds. FBS from 12/16/15 to present has been running in the 300's. FBS today is 332. Pt taking metformin 1000 mg 1/2 tab bid. Hyman Hopes. Pt request cb.

## 2015-12-21 NOTE — Telephone Encounter (Signed)
Let her know I would like her to restart glipizide She should take 2.5 mg daily at breakfast Send Rx #30 x 11

## 2015-12-21 NOTE — Telephone Encounter (Signed)
Med list updated, Spoke with patient and advised results, she had some of the 56m and she will cut those in half and will call when she needs a refill.

## 2015-12-27 ENCOUNTER — Ambulatory Visit (HOSPITAL_COMMUNITY)
Admission: RE | Admit: 2015-12-27 | Discharge: 2015-12-27 | Disposition: A | Discharge status: Home / Self Care | Payer: PPO | Source: Ambulatory Visit | Attending: Cardiology | Admitting: Cardiology

## 2015-12-27 VITALS — BP 132/76 | HR 88 | Wt 159.6 lb

## 2015-12-27 DIAGNOSIS — E1140 Type 2 diabetes mellitus with diabetic neuropathy, unspecified: Secondary | ICD-10-CM | POA: Insufficient documentation

## 2015-12-27 DIAGNOSIS — I2510 Atherosclerotic heart disease of native coronary artery without angina pectoris: Secondary | ICD-10-CM | POA: Diagnosis not present

## 2015-12-27 DIAGNOSIS — K589 Irritable bowel syndrome without diarrhea: Secondary | ICD-10-CM | POA: Diagnosis not present

## 2015-12-27 DIAGNOSIS — I130 Hypertensive heart and chronic kidney disease with heart failure and stage 1 through stage 4 chronic kidney disease, or unspecified chronic kidney disease: Secondary | ICD-10-CM | POA: Diagnosis not present

## 2015-12-27 DIAGNOSIS — Z9981 Dependence on supplemental oxygen: Secondary | ICD-10-CM | POA: Diagnosis not present

## 2015-12-27 DIAGNOSIS — J9610 Chronic respiratory failure, unspecified whether with hypoxia or hypercapnia: Secondary | ICD-10-CM | POA: Diagnosis not present

## 2015-12-27 DIAGNOSIS — I739 Peripheral vascular disease, unspecified: Secondary | ICD-10-CM | POA: Insufficient documentation

## 2015-12-27 DIAGNOSIS — Z7982 Long term (current) use of aspirin: Secondary | ICD-10-CM | POA: Insufficient documentation

## 2015-12-27 DIAGNOSIS — E785 Hyperlipidemia, unspecified: Secondary | ICD-10-CM | POA: Insufficient documentation

## 2015-12-27 DIAGNOSIS — J449 Chronic obstructive pulmonary disease, unspecified: Secondary | ICD-10-CM | POA: Diagnosis not present

## 2015-12-27 DIAGNOSIS — Z833 Family history of diabetes mellitus: Secondary | ICD-10-CM | POA: Diagnosis not present

## 2015-12-27 DIAGNOSIS — I5032 Chronic diastolic (congestive) heart failure: Secondary | ICD-10-CM | POA: Diagnosis not present

## 2015-12-27 DIAGNOSIS — I272 Other secondary pulmonary hypertension: Secondary | ICD-10-CM | POA: Diagnosis not present

## 2015-12-27 DIAGNOSIS — N189 Chronic kidney disease, unspecified: Secondary | ICD-10-CM | POA: Insufficient documentation

## 2015-12-27 DIAGNOSIS — I2781 Cor pulmonale (chronic): Secondary | ICD-10-CM | POA: Diagnosis not present

## 2015-12-27 DIAGNOSIS — E1122 Type 2 diabetes mellitus with diabetic chronic kidney disease: Secondary | ICD-10-CM | POA: Insufficient documentation

## 2015-12-27 DIAGNOSIS — Z87891 Personal history of nicotine dependence: Secondary | ICD-10-CM | POA: Insufficient documentation

## 2015-12-27 DIAGNOSIS — Z79899 Other long term (current) drug therapy: Secondary | ICD-10-CM | POA: Diagnosis not present

## 2015-12-27 DIAGNOSIS — E871 Hypo-osmolality and hyponatremia: Secondary | ICD-10-CM | POA: Diagnosis not present

## 2015-12-27 DIAGNOSIS — Z7984 Long term (current) use of oral hypoglycemic drugs: Secondary | ICD-10-CM | POA: Insufficient documentation

## 2015-12-27 DIAGNOSIS — K219 Gastro-esophageal reflux disease without esophagitis: Secondary | ICD-10-CM | POA: Diagnosis not present

## 2015-12-27 DIAGNOSIS — I2721 Secondary pulmonary arterial hypertension: Secondary | ICD-10-CM

## 2015-12-27 MED ORDER — METOPROLOL TARTRATE 25 MG PO TABS: 12.5000 mg | ORAL_TABLET | Freq: Two times a day (BID) | ORAL | Status: DC

## 2015-12-27 MED ORDER — TORSEMIDE 20 MG PO TABS: ORAL_TABLET | ORAL | Status: DC

## 2015-12-27 MED ORDER — LOVASTATIN 40 MG PO TABS: 80.0000 mg | ORAL_TABLET | Freq: Every day | ORAL | Status: DC

## 2015-12-27 NOTE — Patient Instructions (Signed)
Your physician recommends that you schedule a follow-up appointment in: 2-3 months  Do the following things EVERYDAY: 1) Weigh yourself in the morning before breakfast. Write it down and keep it in a log. 2) Take your medicines as prescribed 3) Eat low salt foods-Limit salt (sodium) to 2000 mg per day.  4) Stay as active as you can everyday 5) Limit all fluids for the day to less than 2 liters 6)

## 2015-12-27 NOTE — Progress Notes (Signed)
Patient ID: Lindsay Harmon, female   DOB: July 02, 1956, 60 y.o.   MRN: [id number]   ADVANCED HF CLINIC NOTE  PCP: Dr Silvio Pate  Primary HF  Cardiologist: Dr Haroldine Laws Pulmonary: [name]   HPI: Lindsay Harmon is a 60 year old with history of HTN, DM, IBS, depression, GERD, severe COPD (FEV1 0.87L)  and pulmonary hypertension with RV failure/cor pulmonale, and chronic diastolic heart failure. .  Admitted in 8/16 with volume overload and severe hyponatremia. Diuresed with IV lasix and transtioned back torsemide and metolazone as needed. Discharge weight 153 pounds.   Admitted to [facility] in 12/16 with volume depletion and hyponatremia with Na 113. Hydrated. Has since followed up with Dr. Anthonette Legato in [place]. Now on torsemide 20 bid and occasional metolazone.   She is here for HF  f/u. Last visit torsemide was increased to 40 mg /20 mg with metolazone as needed. Weight at home 154-157 pounds. She has had metolazone 1-2 times a week. Remains SOB with exertion but this is her baseline. She remains on 2 liters oxygen and 3 at with exertion. Taking all medications.    RHC 12/05/2014 RA 12 mmHg (mean) with O2 sat 72%; RV 97/16 mmHg with O2 sat 73%; PA 97/30 mmHg with O2 sat 67%; PCWP(mean) 14 mmHg (mean); Cardiac Output 5.78 L/min  PFTs 2/92016 with severe COPD  FEV1 0.87 (38%) FVC 1.41 (48%) FEF 25-75 0.41 (19%) Unable to do DLCO  Echo (2/16) EF 60-65%, aortic sclerosis, RV moderately dilated/moderately decreased systolic function, PASP 71 mmHg  06/14/15 K 4.6 Creatinine 2.00 12/11/2015: Na 128 K 3.8 Creatinine 2.01   ROS: All systems negative except as listed in HPI, PMH and Problem List.  SH:  Social History   Social History  . Marital Status: Legally Separated    Spouse Name: N/A  . Number of Children: 1  . Years of Education: N/A   Occupational History  . 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 Smoking status: Former Smoker -- 1.50 packs/day for 20  years    Types: Cigarettes    Quit date: 01/04/2009  . Smokeless tobacco: Never Used  . Alcohol Use: No     Comment: heavy in the past  . Drug Use: No  . Sexual Activity: No   Other Topics Concern  . Not on file   Social History Narrative   No living will   Son Lindsay Harmon should make decisions for her if she is unable.    Would accept resuscitation attempts but no prolonged ventilation   Not sure about tube feeds but probably wouldn't want them if cognitively unaware    FH:  Family History  Problem Relation Age of Onset  . Diabetes Mother   . Emphysema Paternal Grandfather   . Allergies Sister   . Allergies Father   . Cancer Mother     ovarian,melanoma  . Cancer Father     lung  . Cancer Maternal Grandmother     uterine    Past Medical History  Diagnosis Date  . Depression   . GERD (gastroesophageal reflux disease)   . Hyperlipidemia   . Hypertension   . PVD (peripheral vascular disease) ([place])   . CAD (coronary artery disease)   . Familial hematuria   . NIDDM (non-insulin dependent diabetes mellitus)     with neuropathy  . IBS (irritable bowel syndrome)   . Esophageal stricture   . Obesity, unspecified   . Nephrolithiasis   .  Allergy   . Arthritis   . Pulmonary hypertension ([place])     Current Outpatient Prescriptions  Medication Sig Dispense Refill  . albuterol (PROVENTIL HFA;VENTOLIN HFA) 108 (90 BASE) MCG/ACT inhaler Inhale 2 puffs into the lungs every 6 (six) hours as needed for wheezing or shortness of breath. 1 Inhaler 2  . ALPRAZolam (XANAX) 0.25 MG tablet TAKE ONE (1) TABLET EACH DAY 90 tablet 0  . aspirin 325 MG tablet Take 325 mg by mouth daily.      . Blood Glucose Monitoring Suppl (ONE TOUCH ULTRA SYSTEM KIT) w/Device KIT Use to test blood sugar once daily dx: E11.49 E11.51 1 each 0  . calcitRIOL (ROCALTROL) 0.25 MCG capsule Take 0.25 mcg by mouth daily.    Marland Kitchen gabapentin (NEURONTIN) 300 MG capsule 3 (three) times daily. Take 1 cap AM and NOON and 2  caps in PM    . glipiZIDE (GLUCOTROL) 5 MG tablet Take 2.5 mg by mouth daily before breakfast.    . glucose blood (ONE TOUCH TEST STRIPS) test strip Use to test blood sugar once daily dx: E11.49 E11.51 100 each 1  . loratadine (CLARITIN) 10 MG tablet Take 10 mg by mouth 2 (two) times daily.     Marland Kitchen lovastatin (MEVACOR) 40 MG tablet TAKE 2 TABLETS EVERY DAY 180 tablet 3  . metFORMIN (GLUCOPHAGE) 1000 MG tablet TAKE 1/2 TABLET TWICE DAILY WITH A MEAL 90 tablet 3  . methadone (DOLOPHINE) 10 MG tablet Take 2 tablets (20 mg total) by mouth 4 (four) times daily. 240 tablet 0  . metolazone (ZAROXOLYN) 2.5 MG tablet Take 1 tablet (2.5 mg total) by mouth as needed (for wt 156 lb or greater).    . metoprolol tartrate (LOPRESSOR) 25 MG tablet Take 12.5 mg by mouth 2 (two) times daily.    . mirtazapine (REMERON) 30 MG tablet Take 15-30 mg by mouth at bedtime.    . pantoprazole (PROTONIX) 40 MG tablet TAKE 1 TABLET TWICE DAILY 180 tablet 3  . potassium chloride SA (K-DUR,KLOR-CON) 20 MEQ tablet Take 20 mEq by mouth as needed. Take 1 (20 meq) tablet when you take metolazone    . Tiotropium Bromide-Olodaterol (STIOLTO RESPIMAT) 2.5-2.5 MCG/ACT AERS Inhale 2 puffs into the lungs daily. 4 g 11  . torsemide (DEMADEX) 20 MG tablet Take 2 tabs in AM and 1 tab in PM 540 tablet 3  . nitroGLYCERIN (NITROSTAT) 0.4 MG SL tablet Place 0.4 mg under the tongue every 5 (five) minutes as needed for chest pain. Reported on 12/27/2015     No current facility-administered medications for this encounter.    Filed Vitals:   12/27/15 1042  BP: 132/76  Pulse: 88  Weight: 159 lb 9.6 oz (72.394 kg)  SpO2: 91%    PHYSICAL EXAM: General:  Sitting one exam Wearing O2 No resp difficulty.  HEENT: normal Neck: supple. JVP 5-6. Carotids 2+ bilaterally; no bruits. No lymphadenopathy or thryomegaly appreciated. Cor: PMI normal. Regular rate & rhythm. No rubs, gallops or murmurs. Lungs: clear on 2 liters  with markedly reduced BS  throughout Abdomen: obese, soft, nontender, nondistended. No hepatosplenomegaly. No bruits or masses. Good bowel sounds. Extremities: no cyanosis, clubbing, rash, trace edema.   Neuro: alert & orientedx3, cranial nerves grossly intact. Moves all 4 extremities w/o difficulty. Affect pleasant.   ASSESSMENT & PLAN: 1. Chronic Diastolic HF- NYHA III. Volume status stable. Weight really better at 153-155 pounds.  Continue  torsemide to 40/20 and take metolazone when weight 156 or greater  but not more than 2x/week -will follow sodium closely.  Can consider demeclocycline or occasional tolvaptan  --Discussed with Dr. Holley Raring who will co-manage 2. PAH with cor pulmonale  - likely Who Group 3. Not candidate for selective pulmonary vasodilators 3. Severe COPD- Followed by [name] - O2 sats drop on exertion. She has pulse ox and I instructed her to use that to adjust O2 as needed to keep sats >=90% 4. Chronic respiratory failure- on 2 liters nasal cannula .  5. Hyponatremia 6. CKD (baseline cr ~2.0)- Reviewed BMET from 12/11/15   Follow up in 2-3 months .   Alezander Dimaano NP-C  10:58 AM

## 2015-12-28 ENCOUNTER — Other Ambulatory Visit: Payer: Self-pay

## 2015-12-28 ENCOUNTER — Encounter (HOSPITAL_COMMUNITY): Payer: PPO

## 2015-12-28 MED ORDER — METFORMIN HCL 1000 MG PO TABS: 500.0000 mg | ORAL_TABLET | Freq: Two times a day (BID) | ORAL | Status: DC

## 2015-12-28 MED ORDER — PANTOPRAZOLE SODIUM 40 MG PO TBEC: 40.0000 mg | DELAYED_RELEASE_TABLET | Freq: Two times a day (BID) | ORAL | Status: DC

## 2015-12-28 MED ORDER — GLIPIZIDE 5 MG PO TABS: 5.0000 mg | ORAL_TABLET | Freq: Every day | ORAL | Status: DC

## 2015-12-28 MED ORDER — MIRTAZAPINE 30 MG PO TABS: 15.0000 mg | ORAL_TABLET | Freq: Every day | ORAL | Status: DC

## 2015-12-28 MED ORDER — GABAPENTIN 300 MG PO CAPS: 300.0000 mg | ORAL_CAPSULE | Freq: Three times a day (TID) | ORAL | Status: DC

## 2015-12-28 NOTE — Addendum Note (Signed)
Addended by: Pilar Grammes on: 12/28/2015 03:28 PM   Modules accepted: Orders, Medications

## 2015-12-28 NOTE — Telephone Encounter (Addendum)
Pt said FBS still running high; averaging 220-235; today FBS was 221. Pt has been taking glipizide 2.79m in the morning; pt is following her diet closely. AHyman Hopes What to do now? Pt also requesting refills on gabapentin,metformin,pantoprazole(filled per protocol) and mirtazapine. Pt last seen f/u on 10/18/15. Med and orders will not let me pull up the mirtazapine for refill. Envision. (I did not refill the metformin in case there were any changes due to BS still being elevated).

## 2015-12-28 NOTE — Telephone Encounter (Signed)
Please have her increase the glipizide to 42m daily Refill all other meds for a year

## 2015-12-28 NOTE — Telephone Encounter (Signed)
Spoke to pt. She will increase to 54m of glipizide daily. [facility]

## 2015-12-28 NOTE — Addendum Note (Signed)
Addended by: Helene Shoe on: 12/28/2015 12:54 PM   Modules accepted: Orders

## 2016-01-01 ENCOUNTER — Other Ambulatory Visit (HOSPITAL_COMMUNITY): Payer: Self-pay | Admitting: *Deleted

## 2016-01-01 MED ORDER — TORSEMIDE 20 MG PO TABS: ORAL_TABLET | ORAL | Status: DC

## 2016-01-02 ENCOUNTER — Other Ambulatory Visit: Payer: Self-pay | Admitting: Internal Medicine

## 2016-01-02 NOTE — Telephone Encounter (Signed)
Last refill 09-29-15 #90/0 Last OV 11-30-15 Next OV 04-17-16

## 2016-01-02 NOTE — Telephone Encounter (Signed)
Spoke to [place] at pharmacy. Gave verbal refill.

## 2016-01-02 NOTE — Telephone Encounter (Signed)
Approved: #[phone] x 0

## 2016-01-05 ENCOUNTER — Other Ambulatory Visit: Payer: Self-pay

## 2016-01-05 DIAGNOSIS — J449 Chronic obstructive pulmonary disease, unspecified: Secondary | ICD-10-CM | POA: Diagnosis not present

## 2016-01-05 DIAGNOSIS — I509 Heart failure, unspecified: Secondary | ICD-10-CM | POA: Diagnosis not present

## 2016-01-05 MED ORDER — METHADONE HCL 10 MG PO TABS: 20.0000 mg | ORAL_TABLET | Freq: Four times a day (QID) | ORAL | Status: DC

## 2016-01-05 NOTE — Telephone Encounter (Signed)
Pt low risk per FYI, controlled sub agreement signed. Will fill one time given Dr. Silvio Pate out of office for next week and pt to be out in 2 days.

## 2016-01-05 NOTE — Telephone Encounter (Signed)
Pt left v/m requesting rx methadone; call when ready for pick up. Last printed # 240 on 12/11/15 and last seen 12/04/2015. Dr Silvio Pate out of office and will send to Dr Diona Browner.

## 2016-01-08 ENCOUNTER — Other Ambulatory Visit: Payer: Self-pay | Admitting: Internal Medicine

## 2016-01-08 NOTE — Telephone Encounter (Signed)
Last refill 09-29-15 #90/0 Last OV 12-04-15 Next OV 04-17-16 Forwarding to Dr Damita Dunnings in Dr Alla German absence. Please send back to me when approved. Thanks

## 2016-01-08 NOTE — Telephone Encounter (Signed)
Lindsay Harmon notified that her prescription is ready to be picked up at the front desk.

## 2016-01-09 NOTE — Telephone Encounter (Signed)
Please call in.  Thanks.   

## 2016-01-09 NOTE — Telephone Encounter (Signed)
Called in refill.

## 2016-01-17 ENCOUNTER — Encounter: Payer: Self-pay | Admitting: Emergency Medicine

## 2016-01-17 ENCOUNTER — Emergency Department
Admission: EM | Admit: 2016-01-17 | Discharge: 2016-01-17 | Disposition: A | Discharge status: Home / Self Care | Payer: PPO | Attending: Emergency Medicine | Admitting: Emergency Medicine

## 2016-01-17 DIAGNOSIS — N184 Chronic kidney disease, stage 4 (severe): Secondary | ICD-10-CM | POA: Insufficient documentation

## 2016-01-17 DIAGNOSIS — E1140 Type 2 diabetes mellitus with diabetic neuropathy, unspecified: Secondary | ICD-10-CM | POA: Insufficient documentation

## 2016-01-17 DIAGNOSIS — E871 Hypo-osmolality and hyponatremia: Secondary | ICD-10-CM | POA: Diagnosis not present

## 2016-01-17 DIAGNOSIS — I129 Hypertensive chronic kidney disease with stage 1 through stage 4 chronic kidney disease, or unspecified chronic kidney disease: Secondary | ICD-10-CM | POA: Insufficient documentation

## 2016-01-17 DIAGNOSIS — R040 Epistaxis: Secondary | ICD-10-CM | POA: Diagnosis not present

## 2016-01-17 LAB — BASIC METABOLIC PANEL
Anion gap: 11 (ref 5–15)
BUN: 26 mg/dL — ABNORMAL HIGH (ref 6–20)
CALCIUM: 8.5 mg/dL — AB (ref 8.9–10.3)
CO2: 32 mmol/L (ref 22–32)
CREATININE: 1.69 mg/dL — AB (ref 0.44–1.00)
Chloride: 85 mmol/L — ABNORMAL LOW (ref 101–111)
GFR calc Af Amer: 36 mL/min — ABNORMAL LOW (ref >60)
GFR calc non Af Amer: 31 mL/min — ABNORMAL LOW (ref >60)
GLUCOSE: 302 mg/dL — AB (ref 65–99)
Potassium: 3.5 mmol/L (ref 3.5–5.1)
Sodium: 128 mmol/L — ABNORMAL LOW (ref 135–145)

## 2016-01-17 LAB — CBC WITH DIFFERENTIAL/PLATELET
Basophils Absolute: 0 10*3/uL (ref 0–0.1)
Basophils Relative: 0 %
Eosinophils Absolute: 0.3 10*3/uL (ref 0–0.7)
Eosinophils Relative: 5 %
HEMATOCRIT: 33.4 % — AB (ref 35.0–47.0)
Hemoglobin: 11.3 g/dL — ABNORMAL LOW (ref 12.0–16.0)
LYMPHS PCT: 20 %
Lymphs Abs: 1.4 10*3/uL (ref 1.0–3.6)
MCH: 27.5 pg (ref 26.0–34.0)
MCHC: 33.8 g/dL (ref 32.0–36.0)
MCV: 81.2 fL (ref 80.0–100.0)
MONO ABS: 0.5 10*3/uL (ref 0.2–0.9)
MONOS PCT: 7 %
NEUTROS ABS: 4.8 10*3/uL (ref 1.4–6.5)
Neutrophils Relative %: 68 %
Platelets: 213 10*3/uL (ref 150–440)
RBC: 4.12 MIL/uL (ref 3.80–5.20)
RDW: 16.3 % — AB (ref 11.5–14.5)
WBC: 7.1 10*3/uL (ref 3.6–11.0)

## 2016-01-17 MED ORDER — SILVER NITRATE-POT NITRATE 75-25 % EX MISC
CUTANEOUS | Status: AC
  Administered 2016-01-17: 1 via TOPICAL
  Filled 2016-01-17: qty 1

## 2016-01-17 MED ORDER — SILVER NITRATE-POT NITRATE 75-25 % EX MISC
1.0000 "application " | Freq: Once | CUTANEOUS | Status: AC
  Administered 2016-01-17: 1 via TOPICAL

## 2016-01-17 MED ORDER — OXYMETAZOLINE HCL 0.05 % NA SOLN
NASAL | Status: AC
  Administered 2016-01-17: 18:00:00
  Filled 2016-01-17: qty 15

## 2016-01-17 NOTE — ED Notes (Signed)
Pt states her nose began bleeding around 1500 today, is on home O2 at 3L, however, had to take it off d/t the nosebleed. Sats dropped to 77% and were the same upon ED arrival. Pt presented with blue lips, pursed lip breathing, and an active nosebleed.

## 2016-01-17 NOTE — ED Notes (Signed)
Placed on 3L, sats increased to 97%. Pt tolerating oxygen per  well.

## 2016-01-17 NOTE — ED Provider Notes (Signed)
[facility] Emergency Department Provider Note  ____________________________________________  Time seen: Approximately 5:05 PM  I have reviewed the triage vital signs and the nursing notes.   HISTORY  Chief Complaint Epistaxis  HPI Lindsay Harmon is a 60 y.o. female with a history of COPD chronically on O2 who has had intermittent nosebleeds for the past few weeks then developed a nosebleed today at 3 PM out of her right nare that she has been unable to stop.  She denies any recent fever, upper respiratory illness, or easy bleeding elsewhere. She has never had this happen in the past. She is on aspirin 325 mg for coronary artery disease but no other blood thinner.   Past Medical History  Diagnosis Date  . Depression   . GERD (gastroesophageal reflux disease)   . Hyperlipidemia   . Hypertension   . PVD (peripheral vascular disease) ([place])   . CAD (coronary artery disease)   . Familial hematuria   . NIDDM (non-insulin dependent diabetes mellitus)     with neuropathy  . IBS (irritable bowel syndrome)   . Esophageal stricture   . Obesity, unspecified   . Nephrolithiasis   . Allergy   . Arthritis   . Pulmonary hypertension [facility])     Patient Active Problem List   Diagnosis Date Noted  . Acute sinus infection 11/10/2015  . Swelling of lower extremity 07/13/2015  . Hyposmolality and/or hyponatremia 06/22/2015  . Cor pulmonale, chronic ([place])   . Pulmonary artery hypertension ([place])   . Hyponatremia 06/02/2015  . Pulmonary hypertension ([place])   . RVF (right ventricular failure) ([place]) 01/17/2015  . Chronic respiratory failure with hypoxia ([place]) 12/15/2014  . COPD (chronic obstructive pulmonary disease) ([place]) 12/15/2014  . Demand ischemia ([place]) 12/05/2014  . Chronic diastolic heart failure ([place]) 12/05/2014  . Pulmonary HTN ([place]) 12/05/2014  . Hypoalbuminemia   . Chronic renal disease, stage IV ([place]) 12/01/2014  . Advance directive discussed with patient  11/08/2014  . DM (diabetes mellitus), type 2, uncontrolled, periph vascular complic ([place]) 78/93/8101  . Diabetes, polyneuropathy ([place]) 09/15/2013  . Routine general medical examination at a health care facility 08/13/2011  . Coronary atherosclerosis of native coronary artery 02/27/2007  . Chronic back pain 02/27/2007  . Type 2 diabetes mellitus with neurological manifestations, controlled ([place]) 02/26/2007  . Hyperlipemia 02/26/2007  . Episodic mood disorder ([place]) 02/26/2007  . Essential hypertension, benign 02/26/2007  . PVD (peripheral vascular disease) ([place]) 02/26/2007  . GERD 02/26/2007  . DEGENERATIVE JOINT DISEASE 02/26/2007    Past Surgical History  Procedure Laterality Date  . Abdominal hysterectomy    . Cesarean section    . Carpal tunnel release    . Right heart catheterization N/A 12/05/2014    Procedure: RIGHT HEART CATH;  Surgeon: Sinclair Grooms, MD;  Location: [facility] CATH LAB;  Service: Cardiovascular;  Laterality: N/A;    Current Outpatient Rx  Name  Route  Sig  Dispense  Refill  . albuterol (PROVENTIL HFA;VENTOLIN HFA) 108 (90 BASE) MCG/ACT inhaler   Inhalation   Inhale 2 puffs into the lungs every 6 (six) hours as needed for wheezing or shortness of breath.   1 Inhaler   2   . ALPRAZolam (XANAX) 0.25 MG tablet      TAKE ONE (1) TABLET EACH DAY   90 tablet   0     PT NEEDS REFILLS   . aspirin 325 MG tablet   Oral   Take 325 mg by mouth daily.           Marland Kitchen  Blood Glucose Monitoring Suppl (ONE TOUCH ULTRA SYSTEM KIT) w/Device KIT      Use to test blood sugar once daily dx: E11.49 E11.51   1 each   0   . calcitRIOL (ROCALTROL) 0.25 MCG capsule   Oral   Take 0.25 mcg by mouth daily.         Marland Kitchen gabapentin (NEURONTIN) 300 MG capsule   Oral   Take 1 capsule (300 mg total) by mouth 3 (three) times daily.   270 capsule   3   . glipiZIDE (GLUCOTROL) 5 MG tablet   Oral   Take 1 tablet (5 mg total) by mouth daily before breakfast.   90 tablet   3   .  glucose blood (ONE TOUCH TEST STRIPS) test strip      Use to test blood sugar once daily dx: E11.49 E11.51   100 each   1   . loratadine (CLARITIN) 10 MG tablet   Oral   Take 10 mg by mouth 2 (two) times daily.          Marland Kitchen lovastatin (MEVACOR) 40 MG tablet   Oral   Take 2 tablets (80 mg total) by mouth daily.   180 tablet   3   . metFORMIN (GLUCOPHAGE) 1000 MG tablet   Oral   Take 0.5 tablets (500 mg total) by mouth 2 (two) times daily with a meal.   90 tablet   3   . methadone (DOLOPHINE) 10 MG tablet   Oral   Take 2 tablets (20 mg total) by mouth 4 (four) times daily.   240 tablet   0   . metolazone (ZAROXOLYN) 2.5 MG tablet   Oral   Take 1 tablet (2.5 mg total) by mouth as needed (for wt 156 lb or greater).         . metoprolol tartrate (LOPRESSOR) 25 MG tablet   Oral   Take 0.5 tablets (12.5 mg total) by mouth 2 (two) times daily.   90 tablet   3   . mirtazapine (REMERON) 30 MG tablet   Oral   Take 0.5-1 tablets (15-30 mg total) by mouth at bedtime.   90 tablet   3   . nitroGLYCERIN (NITROSTAT) 0.4 MG SL tablet   Sublingual   Place 0.4 mg under the tongue every 5 (five) minutes as needed for chest pain.          . pantoprazole (PROTONIX) 40 MG tablet   Oral   Take 1 tablet (40 mg total) by mouth 2 (two) times daily.   180 tablet   3   . potassium chloride SA (K-DUR,KLOR-CON) 20 MEQ tablet   Oral   Take 20 mEq by mouth as needed. Take 1 (20 meq) tablet when you take metolazone         . Tiotropium Bromide-Olodaterol (STIOLTO RESPIMAT) 2.5-2.5 MCG/ACT AERS   Inhalation   Inhale 2 puffs into the lungs daily.   4 g   11   . torsemide (DEMADEX) 20 MG tablet      Take 2 tabs in AM and 1 tab in PM   270 tablet   3     Allergies Review of patient's allergies indicates no known allergies.  Family History  Problem Relation Age of Onset  . Diabetes Mother   . Emphysema Paternal Grandfather   . Allergies Sister   . Allergies Father   .  Cancer Mother     ovarian,melanoma  . Cancer Father  lung  . Cancer Maternal Grandmother     uterine    Social History Social History  Substance Use Topics  . Smoking status: Former Smoker -- 1.50 packs/day for 20 years    Types: Cigarettes    Quit date: 01/04/2009  . Smokeless tobacco: Never Used  . Alcohol Use: No     Comment: heavy in the past    Review of Systems Constitutional: No fever/chills Cardiovascular: Denies chest pain. Respiratory: Denies shortness of breath above baseline. Gastrointestinal: No abdominal pain.    10-point ROS otherwise negative.  ____________________________________________   PHYSICAL EXAM:  VITAL SIGNS: ED Triage Vitals  Enc Vitals Group     BP 01/17/16 1657 210/77 mmHg     Pulse Rate 01/17/16 1657 120     Resp 01/17/16 1657 28     Temp 01/17/16 1657 98 F (36.7 C)     Temp Source 01/17/16 1657 Oral     SpO2 01/17/16 1657 77 %     Weight 01/17/16 1657 156 lb (70.761 kg)     Height 01/17/16 1657 _0  (1.575 m)     Head Cir --      Peak Flow --      Pain Score --      Pain Loc --      Pain Edu? --      Excl. in [place]? --     Constitutional: Alert and oriented. Well appearing; holding tissue to nose with active bleeding right nares Eyes: Conjunctivae are normal. PERRL. EOMI. Head: Atraumatic. Nose: Active bleeding right nare Mouth/Throat: Mucous membranes are moist.  Oropharynx non-erythematous. Neck: No stridor.  Cardiovascular: Normal rate, regular rhythm. Grossly normal heart sounds.  Good peripheral circulation. Respiratory: Normal respiratory effort.  No retractions. Lungs CTAB. Gastrointestinal: Soft and nontender. No distention. N Musculoskeletal: No lower extremity tenderness nor edema.  No joint effusions. Neurologic:  Normal speech and language. No gross focal neurologic deficits are appreciated. Skin:  Skin is warm, dry and intact. No rash noted. Psychiatric: Mood and affect are normal. Speech and behavior are  normal.  ____________________________________________   LABS (all labs ordered are listed, but only abnormal results are displayed)  Labs Reviewed  CBC WITH DIFFERENTIAL/PLATELET - Abnormal; Notable for the following:    Hemoglobin 11.3 (*)    HCT 33.4 (*)    RDW 16.3 (*)    All other components within normal limits  BASIC METABOLIC PANEL - Abnormal; Notable for the following:    Sodium 128 (*)    Chloride 85 (*)    Glucose, Bld 302 (*)    BUN 26 (*)    Creatinine, Ser 1.69 (*)    Calcium 8.5 (*)    GFR calc non Af Amer 31 (*)    GFR calc Af Amer 36 (*)    All other components within normal limits   ________  PROCEDURES  Procedure(s) performed: Afrin instilled right nare and direct pressure applied for 10 minutes.  Pt SaO2 to 99% with [place] in mouth at home dose of 2L.    ____________________________________________   INITIAL IMPRESSION / ASSESSMENT AND PLAN / ED COURSE  Pertinent labs & imaging results that were available during my care of the patient were reviewed by me and considered in my medical decision making (see chart for details).  ----------------------------------------- 5:51 PM on 01/17/2016 -----------------------------------------  Bleeding has improved but there is still some oozing. Patient reports she did have some bleeding out of her left nares also. I applied Afrin  to bilateral nares.  Pyxis out of silver nitrate; called pharmacy to send.  6:30p-Applied silver nitrate R nare  ----------------------------------------- 7:52 PM on 01/17/2016 -----------------------------------------  No further bleeding. Patient with normal SaO2 on her home oxygen. She understands strict return precautions.  Filed Vitals:   01/17/16 1829 01/17/16 1900  BP:  152/69  Pulse: 95 89  Temp:    Resp: 12 10   Stable hyponatremia. Sodium was 128 in February. Creatinine has improved since February. Will have patient follow up with  PCP ____________________________________________   FINAL CLINICAL IMPRESSION(S) / ED DIAGNOSES  Final diagnoses:  Anterior epistaxis       Ponciano Ort, MD 01/17/16 5993

## 2016-01-17 NOTE — ED Notes (Signed)
Nosebleed continues. Dr made aware.

## 2016-01-17 NOTE — ED Notes (Signed)
No silver nitrate in the pyxis, pharmacy notified.

## 2016-01-17 NOTE — ED Notes (Signed)
Bleeding resolved.  Patient up for discharge.

## 2016-01-17 NOTE — Discharge Instructions (Signed)
Return to the ER with further bleeding if you are unable to stop it with direct pressure as demonstrated in the ER. Use humidified oxygen for the next 5 days. Hold your aspirin for the next week.  Have your doctor recheck your sodium next week. It is the same level that it was in February at 128 which is low.   Nosebleed Nosebleeds are common. They are due to a crack in the inside lining of your nose (mucous membrane) or from a small blood vessel that starts to bleed. Nosebleeds can be caused by many conditions, such as injury, infections, dry mucous membranes or dry climate, medicines, nose picking, and home heating and cooling systems. Most nosebleeds come from blood vessels in the front of your nose. HOME CARE INSTRUCTIONS   Try controlling your nosebleed by pinching your nostrils gently and continuously for at least 10 minutes.  Avoid blowing or sniffing your nose for a number of hours after having a nosebleed.  Do not put gauze inside your nose yourself. If your nose was packed by your health care provider, try to maintain the pack inside of your nose until your health care provider removes it.  If a gauze pack was used and it starts to fall out, gently replace it or cut off the end of it.  If a balloon catheter was used to pack your nose, do not cut or remove it unless your health care provider has instructed you to do that.  Avoid lying down while you are having a nosebleed. Sit up and lean forward.  Use a nasal spray decongestant to help with a nosebleed as directed by your health care provider.  Do not use petroleum jelly or mineral oil in your nose. These can drip into your lungs.  Maintain humidity in your home by using less air conditioning or by using a humidifier.  Aspirinand blood thinners make bleeding more likely. If you are prescribed these medicines and you suffer from nosebleeds, ask your health care provider if you should stop taking the medicines or adjust the dose. Do  not stop medicines unless directed by your health care provider  Resume your normal activities as you are able, but avoid straining, lifting, or bending at the waist for several days.  If your nosebleed was caused by dry mucous membranes, use over-the-counter saline nasal spray or gel. This will keep the mucous membranes moist and allow them to heal. If you must use a lubricant, choose the water-soluble variety. Use it only sparingly, and do not use it within several hours of lying down.  Keep all follow-up visits as directed by your health care provider. This is important. SEEK MEDICAL CARE IF:  You have a fever.  You get frequent nosebleeds.  You are getting nosebleeds more often. SEEK IMMEDIATE MEDICAL CARE IF:  Your nosebleed lasts longer than 20 minutes.  Your nosebleed occurs after an injury to your face, and your nose looks crooked or broken.  You have unusual bleeding from other parts of your body.  You have unusual bruising on other parts of your body.  You feel light-headed or you faint.  You become sweaty.  You vomit blood.  Your nosebleed occurs after a head injury.   This information is not intended to replace advice given to you by your health care provider. Make sure you discuss any questions you have with your health care provider.   Document Released: 07/24/2005 Document Revised: 11/04/2014 Document Reviewed: 05/30/2014 Elsevier Interactive Patient Education 2016  [place].  Hyponatremia Hyponatremia is when the amount of salt (sodium) in your blood is too low. When salt levels are low, your cells absorb extra water and they swell. The swelling happens throughout the body, but it mostly affects the brain.  HOME CARE  Take medicines only as told by your doctor. Many medicines can make this condition worse. Talk with your doctor about any medicines that you are currently taking.  Carefully follow a recommended diet as told by your doctor.  Carefully  follow instructions from your doctor about fluid restrictions.  Keep all follow-up visits as told by your doctor. This is important.  Do not drink alcohol. GET HELP IF:  You feel sicker to your stomach (nauseous).  You feel more confused.  You feel more tired (fatigued).  Your headache gets worse.  You feel weaker.  Your symptoms go away and then they come back.  You have trouble following the diet instructions. GET HELP RIGHT AWAY IF:  You start to twitch and shake (have a seizure).  You pass out (faint).  You keep having watery poop (diarrhea).  You keep throwing up (vomiting).   This information is not intended to replace advice given to you by your health care provider. Make sure you discuss any questions you have with your health care provider.   Document Released: 06/26/2011 Document Revised: 02/28/2015 Document Reviewed: 10/10/2014 Elsevier Interactive Patient Education [employer].

## 2016-01-18 ENCOUNTER — Other Ambulatory Visit: Payer: Self-pay | Admitting: *Deleted

## 2016-01-18 ENCOUNTER — Encounter: Payer: Self-pay | Admitting: *Deleted

## 2016-01-18 NOTE — Patient Outreach (Addendum)
[facility]) Care Harmon  01/18/2016  Lindsay Harmon 31-Jan-1956 [id number]  Subjective: Telephone call to patient's home number, spoke with patient, and HIPAA verified.  Discussed Lindsay Harmon Care [facility]on services and patient declined services at this time due to no care Harmon needs.   Patient gave verbal authorization for RNCM to speak with mother Lindsay Harmon) regarding her healthcare needs as needed.   Patient states she is feeling fine and nosebleed has stopped.   States she went to ED yesterday, was given silver nitrate times 2 and the bleeding resolved.   Patient states her systolic blood pressure was around 220 and it decreased after bleeding was under control.  RNCM educated patient on how pain and increased emotions can elevate blood pressure.  Patient voiced understanding and is in agreement to monitor blood pressure.   States she will take blood pressure daily until she goes to her follow up appointment with Dr. Viviana Simpler on 01/24/16 at 8:15am.  States she has not taken her blood pressure lately and will take it this afternoon.   Patient states she was also advised by ED provider to discontinue taking aspirin for a least week due to nose bleed.   States she will follow up with primary MD next week regarding plan for aspirin in the future.   States she has a humidifier hooked to her home O2 concentrator to assist with moisture in nasal passages. Patient able to self manage care, aware of signs and symptoms of disease exacerbation, and when to notify MD of status changes.   Patient states she has a history of congestive heart failure, COPD, diabetes, and hypertension.  Patient states she monitors blood sugars and weight daily as directed.    Patient states her weight today was 157 pounds and she did not have to take any fluid pills.   Patient states her blood sugar was 287 this morning and feels like it is high because she ate dinner very late yesterday due to being in the  ED.  States she will take blood sugar again to see if there are any changes.  States she will follow up with MD if needed.  Patient states she received excellent and prompt care at the ED yesterday.    Patient states she has no care Harmon needs at this time, still has Lindsay Harmon contact number, and will call Lindsay Harmon if services needed.    Objective:  Per Epic case review.  Patient graduated from Lindsay Harmon services on 08/09/15 due goals being met.    Assessment: Received Nurse Advice line referral on 01/18/16.    Nurse line assessment: Patient's mother called in, stated her daughter has nosebleed for 30 minutes, it will not stop, and daughter on home oxygen.   Patient having nosebleed that had been going on for over 30 minutes, had proper interventions, and patient advised to go to ED.   Patient referred to [facility] ED.     Patient currently has no nosebleed and no care Harmon needs at this time.   Plan:  RNCM will send case closure letter due to refusal/ no case Harmon  needs to patient's primary MD. [facility] will send MD notification of the following issues:  Patient will monitor blood pressure, blood sugars daily, and report to MD on next office visit.   Patient will also follow up with MD regarding status of aspirin regimen.    Lindsay Harmon Therapist, sports, BSN, [place] [facility] Telephonic CM Phone:  [phone] Fax: [phone]

## 2016-01-24 ENCOUNTER — Telehealth: Payer: Self-pay | Admitting: Internal Medicine

## 2016-01-24 ENCOUNTER — Encounter: Payer: Self-pay | Admitting: Internal Medicine

## 2016-01-24 ENCOUNTER — Ambulatory Visit (INDEPENDENT_AMBULATORY_CARE_PROVIDER_SITE_OTHER): Payer: PPO | Admitting: Internal Medicine

## 2016-01-24 VITALS — BP 120/70 | HR 84 | Temp 98.2°F | Wt 158.0 lb

## 2016-01-24 DIAGNOSIS — I5032 Chronic diastolic (congestive) heart failure: Secondary | ICD-10-CM | POA: Diagnosis not present

## 2016-01-24 DIAGNOSIS — E871 Hypo-osmolality and hyponatremia: Secondary | ICD-10-CM

## 2016-01-24 DIAGNOSIS — E1151 Type 2 diabetes mellitus with diabetic peripheral angiopathy without gangrene: Secondary | ICD-10-CM

## 2016-01-24 DIAGNOSIS — R040 Epistaxis: Secondary | ICD-10-CM | POA: Insufficient documentation

## 2016-01-24 DIAGNOSIS — E1165 Type 2 diabetes mellitus with hyperglycemia: Secondary | ICD-10-CM

## 2016-01-24 DIAGNOSIS — J439 Emphysema, unspecified: Secondary | ICD-10-CM

## 2016-01-24 DIAGNOSIS — I2781 Cor pulmonale (chronic): Secondary | ICD-10-CM

## 2016-01-24 DIAGNOSIS — IMO0002 Reserved for concepts with insufficient information to code with codable children: Secondary | ICD-10-CM

## 2016-01-24 LAB — LIPID PANEL
CHOL/HDL RATIO: 4
CHOLESTEROL: 140 mg/dL (ref 0–200)
HDL: 35.9 mg/dL — ABNORMAL LOW (ref >39.00)
NONHDL: 104.49
Triglycerides: 241 mg/dL — ABNORMAL HIGH (ref 0.0–149.0)
VLDL: 48.2 mg/dL — AB (ref 0.0–40.0)

## 2016-01-24 LAB — LDL CHOLESTEROL, DIRECT: Direct LDL: 70 mg/dL

## 2016-01-24 LAB — HEMOGLOBIN A1C: Hgb A1c MFr Bld: 10.7 % — ABNORMAL HIGH (ref 4.6–6.5)

## 2016-01-24 NOTE — Assessment & Plan Note (Signed)
Still high If above or near 9%---will add second glipizide dose

## 2016-01-24 NOTE — Telephone Encounter (Signed)
Patient said she spoke to [place] earlier about her lab work.  Patient is asking for Larene [place] to call her back.

## 2016-01-24 NOTE — Assessment & Plan Note (Signed)
Better now Will use humidifier on oxygen ENT if recurs

## 2016-01-24 NOTE — Progress Notes (Signed)
Pre visit review using our clinic review tool, if applicable. No additional management support is needed unless otherwise documented below in the visit note.

## 2016-01-24 NOTE — Progress Notes (Signed)
Subjective:    Patient ID: Lindsay Harmon, female    DOB: Jun 16, 1956, 60 y.o.   MRN: [id number]  HPI Here for follow up of multiple medical problems ER visit for epistaxis--had cautery done on right nare Slight bleed since then--but she stopped it quickly  Sugars still high Lowest she has had is 174 Highest to 200 or so Taking glipizide daily and metformin 500 bid  Doing well with the CHF Breathing is good Sleeps in recliner--no PND No chest pain No palpitations No sig edema  No cough or wheezing  Current Outpatient Prescriptions on File Prior to Visit  Medication Sig Dispense Refill  . albuterol (PROVENTIL HFA;VENTOLIN HFA) 108 (90 BASE) MCG/ACT inhaler Inhale 2 puffs into the lungs every 6 (six) hours as needed for wheezing or shortness of breath. 1 Inhaler 2  . ALPRAZolam (XANAX) 0.25 MG tablet Take 0.25 mg by mouth daily.    Marland Kitchen aspirin 325 MG tablet Take 325 mg by mouth at bedtime.     . calcitRIOL (ROCALTROL) 0.25 MCG capsule Take 0.25 mcg by mouth daily.    Marland Kitchen gabapentin (NEURONTIN) 300 MG capsule Take 300-600 mg by mouth 3 (three) times daily. Pt takes one capsule in the morning, one at noon, and two at bedtime.    Marland Kitchen glipiZIDE (GLUCOTROL) 5 MG tablet Take 5 mg by mouth 2 (two) times daily before a meal.    . loratadine (CLARITIN) 10 MG tablet Take 10 mg by mouth 2 (two) times daily.     Marland Kitchen lovastatin (MEVACOR) 40 MG tablet Take 80 mg by mouth at bedtime.    . metFORMIN (GLUCOPHAGE) 1000 MG tablet Take 0.5 tablets (500 mg total) by mouth 2 (two) times daily with a meal. 90 tablet 3  . methadone (DOLOPHINE) 10 MG tablet Take 2 tablets (20 mg total) by mouth 4 (four) times daily. 240 tablet 0  . metolazone (ZAROXOLYN) 2.5 MG tablet Take 2.5 mg by mouth daily as needed (when pts weight reaches 157 or greater).     . metoprolol tartrate (LOPRESSOR) 25 MG tablet Take 0.5 tablets (12.5 mg total) by mouth 2 (two) times daily. 90 tablet 3  . mirtazapine (REMERON) 30 MG tablet Take  15 mg by mouth at bedtime.    . nitroGLYCERIN (NITROSTAT) 0.4 MG SL tablet Place 0.4 mg under the tongue every 5 (five) minutes as needed for chest pain.     . pantoprazole (PROTONIX) 40 MG tablet Take 1 tablet (40 mg total) by mouth 2 (two) times daily. 180 tablet 3  . potassium chloride SA (K-DUR,KLOR-CON) 20 MEQ tablet Take 20 mEq by mouth as needed (when taking Metolazone).     . Tiotropium Bromide-Olodaterol (STIOLTO RESPIMAT) 2.5-2.5 MCG/ACT AERS Inhale 2 puffs into the lungs daily. 4 g 11  . torsemide (DEMADEX) 20 MG tablet Take 20-40 mg by mouth 2 (two) times daily. Pt takes two tablets in the morning and one in the evening.    . Vitamin D, Ergocalciferol, (DRISDOL) 50000 units CAPS capsule Take 50,000 Units by mouth every 7 (seven) days. Pt takes on Monday.     No current facility-administered medications on file prior to visit.    No Known Allergies  Past Medical History  Diagnosis Date  . Depression   . GERD (gastroesophageal reflux disease)   . Hyperlipidemia   . Hypertension   . PVD (peripheral vascular disease) ([place])   . CAD (coronary artery disease)   . Familial hematuria   . NIDDM (non-insulin  dependent diabetes mellitus)     with neuropathy  . IBS (irritable bowel syndrome)   . Esophageal stricture   . Obesity, unspecified   . Nephrolithiasis   . Allergy   . Arthritis   . Pulmonary hypertension [facility])     Past Surgical History  Procedure Laterality Date  . Abdominal hysterectomy    . Cesarean section    . Carpal tunnel release    . Right heart catheterization N/A 12/05/2014    Procedure: RIGHT HEART CATH;  Surgeon: Sinclair Grooms, MD;  Location: [facility] CATH LAB;  Service: Cardiovascular;  Laterality: N/A;    Family History  Problem Relation Age of Onset  . Diabetes Mother   . Emphysema Paternal Grandfather   . Allergies Sister   . Allergies Father   . Cancer Mother     ovarian,melanoma  . Cancer Father     lung  . Cancer Maternal Grandmother      uterine    Social History   Social History  . Marital Status: Legally Separated    Spouse Name: N/A  . Number of Children: 1  . Years of Education: N/A   Occupational History  . 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 Smoking status: Former Smoker -- 1.50 packs/day for 20 years    Types: Cigarettes    Quit date: 01/04/2009  . Smokeless tobacco: Never Used  . Alcohol Use: No     Comment: heavy in the past  . Drug Use: No  . Sexual Activity: No   Other Topics Concern  . Not on file   Social History Narrative   No living will   Son [name] should make decisions for her if she is unable.    Would accept resuscitation attempts but no prolonged ventilation   Not sure about tube feeds but probably wouldn't want them if cognitively unaware   Review of Systems  Appetite is okay Weight fairly stable Sleeps fair--never great but about the same      Objective:   Physical Exam  Constitutional: She appears well-developed. No distress.  HENT:  Mild bleeding spots on left septum  Neck: Normal range of motion. Neck supple. No thyromegaly present.  Cardiovascular: Normal rate, regular rhythm and normal heart sounds.  Exam reveals no gallop.   No murmur heard. Pulmonary/Chest: Effort normal and breath sounds normal. No respiratory distress. She has no wheezes. She has no rales.  Musculoskeletal: She exhibits no edema.  Lymphadenopathy:    She has no cervical adenopathy.  Psychiatric: She has a normal mood and affect. Her behavior is normal.          Assessment & Plan:

## 2016-01-24 NOTE — Assessment & Plan Note (Signed)
Related to chronic lung disease and stiff heart Managed with diuretics

## 2016-01-24 NOTE — Assessment & Plan Note (Signed)
Seems to be compensated Follows with CHF clinic

## 2016-01-24 NOTE — Assessment & Plan Note (Signed)
128 may be about the best she gets No action on this

## 2016-01-24 NOTE — Assessment & Plan Note (Signed)
This is not the major issue with her hypoxia and functional problems

## 2016-01-25 NOTE — Telephone Encounter (Signed)
The doctor at the ER said she needed to stop taking aspirin for a week because of the nose bleeds. She wanted to know when you think she should go back on it.

## 2016-01-25 NOTE — Telephone Encounter (Signed)
I think that sounds fine Have her restart after a week

## 2016-01-25 NOTE — Telephone Encounter (Signed)
Spoke to pt. She will start back today.

## 2016-02-01 ENCOUNTER — Other Ambulatory Visit: Payer: Self-pay

## 2016-02-01 NOTE — Telephone Encounter (Signed)
Pt left v/m requesting rx methadone. Call when ready for pick up. Last printed # 240 on 01/05/16; last seen 01/24/16.

## 2016-02-02 ENCOUNTER — Other Ambulatory Visit: Payer: Self-pay | Admitting: Internal Medicine

## 2016-02-02 DIAGNOSIS — Z1231 Encounter for screening mammogram for malignant neoplasm of breast: Secondary | ICD-10-CM

## 2016-02-02 MED ORDER — METHADONE HCL 10 MG PO TABS: 20.0000 mg | ORAL_TABLET | Freq: Four times a day (QID) | ORAL | Status: DC

## 2016-02-02 NOTE — Telephone Encounter (Signed)
Spoke to patient. Rx up front to pickup

## 2016-02-05 DIAGNOSIS — I509 Heart failure, unspecified: Secondary | ICD-10-CM | POA: Diagnosis not present

## 2016-02-05 DIAGNOSIS — J449 Chronic obstructive pulmonary disease, unspecified: Secondary | ICD-10-CM | POA: Diagnosis not present

## 2016-02-07 ENCOUNTER — Other Ambulatory Visit: Payer: Self-pay

## 2016-02-07 MED ORDER — ALBUTEROL SULFATE HFA 108 (90 BASE) MCG/ACT IN AERS: 2.0000 | INHALATION_SPRAY | Freq: Four times a day (QID) | RESPIRATORY_TRACT | Status: DC | PRN

## 2016-02-12 DIAGNOSIS — N184 Chronic kidney disease, stage 4 (severe): Secondary | ICD-10-CM | POA: Diagnosis not present

## 2016-02-12 DIAGNOSIS — N2581 Secondary hyperparathyroidism of renal origin: Secondary | ICD-10-CM | POA: Diagnosis not present

## 2016-02-12 DIAGNOSIS — I10 Essential (primary) hypertension: Secondary | ICD-10-CM | POA: Diagnosis not present

## 2016-02-12 DIAGNOSIS — E1122 Type 2 diabetes mellitus with diabetic chronic kidney disease: Secondary | ICD-10-CM | POA: Diagnosis not present

## 2016-02-12 DIAGNOSIS — E871 Hypo-osmolality and hyponatremia: Secondary | ICD-10-CM | POA: Diagnosis not present

## 2016-02-13 ENCOUNTER — Telehealth: Payer: Self-pay

## 2016-02-13 NOTE — Telephone Encounter (Signed)
Pt left v/m; pt was seen 02/12/16 by Kidney doctor; metformin was stopped by kidney doctor and pt thinks kidney doctor will send note to Dr Silvio Pate about stopping metformin; pt wants to know what Dr Silvio Pate will do about controlling diabetes since metformin was stopped. Pt request cb.Asher mcadams.

## 2016-02-14 ENCOUNTER — Ambulatory Visit
Admission: RE | Admit: 2016-02-14 | Discharge: 2016-02-14 | Disposition: A | Discharge status: Home / Self Care | Payer: PPO | Source: Ambulatory Visit | Attending: Internal Medicine | Admitting: Internal Medicine

## 2016-02-14 DIAGNOSIS — Z1231 Encounter for screening mammogram for malignant neoplasm of breast: Secondary | ICD-10-CM | POA: Diagnosis not present

## 2016-02-14 NOTE — Telephone Encounter (Signed)
Spoke to patient. She will keep checking it in the mornings and will call if it stays above 160.

## 2016-02-14 NOTE — Telephone Encounter (Signed)
Left message to call back

## 2016-02-14 NOTE — Telephone Encounter (Signed)
If her sugars run up, the safest thing will be to start low dose long acting insulin. She should monitor her sugars---if fastings run consistently over 160-170, she can come in and we will discuss starting the insulin

## 2016-02-22 DIAGNOSIS — E1122 Type 2 diabetes mellitus with diabetic chronic kidney disease: Secondary | ICD-10-CM | POA: Diagnosis not present

## 2016-02-22 DIAGNOSIS — E871 Hypo-osmolality and hyponatremia: Secondary | ICD-10-CM | POA: Diagnosis not present

## 2016-02-22 DIAGNOSIS — I10 Essential (primary) hypertension: Secondary | ICD-10-CM | POA: Diagnosis not present

## 2016-02-22 DIAGNOSIS — N183 Chronic kidney disease, stage 3 (moderate): Secondary | ICD-10-CM | POA: Diagnosis not present

## 2016-02-27 ENCOUNTER — Ambulatory Visit (INDEPENDENT_AMBULATORY_CARE_PROVIDER_SITE_OTHER): Payer: PPO | Admitting: Internal Medicine

## 2016-02-27 ENCOUNTER — Encounter: Payer: Self-pay | Admitting: Internal Medicine

## 2016-02-27 ENCOUNTER — Ambulatory Visit (INDEPENDENT_AMBULATORY_CARE_PROVIDER_SITE_OTHER): Payer: PPO

## 2016-02-27 VITALS — BP 92/60 | HR 80 | Temp 98.4°F | Ht 62.0 in | Wt 156.2 lb

## 2016-02-27 DIAGNOSIS — Z23 Encounter for immunization: Secondary | ICD-10-CM

## 2016-02-27 DIAGNOSIS — E1151 Type 2 diabetes mellitus with diabetic peripheral angiopathy without gangrene: Secondary | ICD-10-CM | POA: Diagnosis not present

## 2016-02-27 DIAGNOSIS — Z0000 Encounter for general adult medical examination without abnormal findings: Secondary | ICD-10-CM | POA: Diagnosis not present

## 2016-02-27 DIAGNOSIS — I5032 Chronic diastolic (congestive) heart failure: Secondary | ICD-10-CM | POA: Diagnosis not present

## 2016-02-27 DIAGNOSIS — E1165 Type 2 diabetes mellitus with hyperglycemia: Secondary | ICD-10-CM

## 2016-02-27 DIAGNOSIS — N184 Chronic kidney disease, stage 4 (severe): Secondary | ICD-10-CM

## 2016-02-27 DIAGNOSIS — J9611 Chronic respiratory failure with hypoxia: Secondary | ICD-10-CM | POA: Diagnosis not present

## 2016-02-27 DIAGNOSIS — IMO0002 Reserved for concepts with insufficient information to code with codable children: Secondary | ICD-10-CM

## 2016-02-27 DIAGNOSIS — F39 Unspecified mood [affective] disorder: Secondary | ICD-10-CM

## 2016-02-27 LAB — HM DIABETES FOOT EXAM

## 2016-02-27 MED ORDER — INSULIN GLARGINE 100 UNIT/ML SOLOSTAR PEN: 10.0000 [IU] | PEN_INJECTOR | Freq: Every day | SUBCUTANEOUS | Status: DC

## 2016-02-27 NOTE — Patient Instructions (Signed)
Lindsay Harmon , Thank you for taking time to come for your Medicare Wellness Visit. I appreciate your ongoing commitment to your health goals. Please review the following plan we discussed and let me know if I can assist you in the future.   These are the goals we discussed: Goals    Starting 02/27/2016, I will attempt to do at least 5 min of chair exercises daily.       This is a list of the screening recommended for you and due dates:  Health Maintenance  Topic Date Due  . Complete foot exam   02/27/2016*  . Tetanus Vaccine  05/19/2016  . Flu Shot  05/28/2016  . Hemoglobin A1C  07/26/2016  . Eye exam for diabetics  08/21/2016  . Mammogram  02/13/2018  . Colon Cancer Screening  09/09/2018  . Pneumococcal vaccine  Completed  . Shingles Vaccine  Completed  .  Hepatitis C: One time screening is recommended by Center for Disease Control  (CDC) for  adults born from [birth year] through 1967.   Completed  . HIV Screening  Completed  *Topic was postponed. The date shown is not the original due date.   Preventive Care for Adults  A healthy lifestyle and preventive care can promote health and wellness. Preventive health guidelines for adults include the following key practices.  . A routine yearly physical is a good way to check with your health care provider about your health and preventive screening. It is a chance to share any concerns and updates on your health and to receive a thorough exam.  . Visit your dentist for a routine exam and preventive care every 6 months. Brush your teeth twice a day and floss once a day. Good oral hygiene prevents tooth decay and gum disease.  . The frequency of eye exams is based on your age, health, family medical history, use  of contact lenses, and other factors. Follow your health care provider's ecommendations for frequency of eye exams.  . Eat a healthy diet. Foods like vegetables, fruits, whole grains, low-fat dairy products, and lean protein foods contain  the nutrients you need without too many calories. Decrease your intake of foods high in solid fats, added sugars, and salt. Eat the right amount of calories for you. Get information about a proper diet from your health care provider, if necessary.  . Regular physical exercise is one of the most important things you can do for your health. Most adults should get at least 150 minutes of moderate-intensity exercise (any activity that increases your heart rate and causes you to sweat) each week. In addition, most adults need muscle-strengthening exercises on 2 or more days a week.  Silver Sneakers may be a benefit available to you. To determine eligibility, you may visit the website: www.silversneakers.com or contact program at 501-421-3288 Mon-Fri between 8AM-8PM.   . Maintain a healthy weight. The body mass index (BMI) is a screening tool to identify possible weight problems. It provides an estimate of body fat based on height and weight. Your health care provider can find your BMI and can help you achieve or maintain a healthy weight.   For adults 20 years and older: ? A BMI below 18.5 is considered underweight. ? A BMI of 18.5 to 24.9 is normal. ? A BMI of 25 to 29.9 is considered overweight. ? A BMI of 30 and above is considered obese.   . Maintain normal blood lipids and cholesterol levels by exercising and minimizing your intake  of saturated fat. Eat a balanced diet with plenty of fruit and vegetables. Blood tests for lipids and cholesterol should begin at age 91 and be repeated every 5 years. If your lipid or cholesterol levels are high, you are over 50, or you are at high risk for heart disease, you may need your cholesterol levels checked more frequently. Ongoing high lipid and cholesterol levels should be treated with medicines if diet and exercise are not working.  . If you smoke, find out from your health care provider how to quit. If you do not use tobacco, please do not start.  . If  you choose to drink alcohol, please do not consume more than 2 drinks per day. One drink is considered to be 12 ounces (355 mL) of beer, 5 ounces (148 mL) of wine, or 1.5 ounces (44 mL) of liquor.  . If you are 33-27 years old, ask your health care provider if you should take aspirin to prevent strokes.  . Use sunscreen. Apply sunscreen liberally and repeatedly throughout the day. You should seek shade when your shadow is shorter than you. Protect yourself by wearing long sleeves, pants, a wide-brimmed hat, and sunglasses year round, whenever you are outdoors.  . Once a month, do a whole body skin exam, using a mirror to look at the skin on your back. Tell your health care provider of new moles, moles that have irregular borders, moles that are larger than a pencil eraser, or moles that have changed in shape or color.

## 2016-02-27 NOTE — Progress Notes (Signed)
Subjective:   Lindsay Harmon is a 60 y.o. female who presents for Medicare Annual (Subsequent) preventive examination.  Review of Systems:  N/A Cardiac Risk Factors include: advanced age (>67mn, >>4women);diabetes mellitus;hypertension     Objective:     Vitals: BP 92/60 mmHg  Pulse 80  Temp(Src) 98.4 F (36.9 C) (Oral)  Ht _0  (1.575 m)  Wt 156 lb 4 oz (70.875 kg)  BMI 28.57 kg/m2  SpO2 91%  Body mass index is 28.57 kg/(m^2).   Tobacco History  Smoking status  . Former Smoker -- 1.50 packs/day for 20 years  . Types: Cigarettes  . Quit date: 01/04/2009  Smokeless tobacco  . Never Used     Counseling given: No   Past Medical History  Diagnosis Date  . Depression   . GERD (gastroesophageal reflux disease)   . Hyperlipidemia   . Hypertension   . P[place] disease) ([place]   . CAD (coronary artery disease)   . Familial hematuria   . NIDDM (non-insulin dependent diabetes mellitus)     with neuropathy  . IBS (irritable bowel syndrome)   . Esophageal stricture   . Obesity, unspecified   . Nephrolithiasis   . Allergy   . Arthritis   . Pulmonary hypertension ([facility]    Past Surgical History  Procedure Laterality Date  . Abdominal hysterectomy    . Cesarean section    . Carpal tunnel release    . Right heart catheterization N/A 12/05/2014    Procedure: RIGHT HEART CATH;  Surgeon: HSinclair Grooms MD;  [facility];  Service: Cardiovascular;  Laterality: N/A;   Family History  Problem Relation Age of Onset  . Diabetes Mother   . Cancer Mother     ovarian,melanoma  . Emphysema Paternal Grandfather   . Allergies Sister   . Allergies Father   . Cancer Father     lung  . Cancer Maternal Grandmother     uterine  . Breast cancer Neg Hx    History  Sexual Activity  . Sexual Activity: No    Outpatient Encounter Prescriptions as of 02/27/2016  Medication Sig  . albuterol (PROVENTIL HFA;VENTOLIN HFA) 108 (90 Base) MCG/ACT inhaler  Inhale 2 puffs into the lungs every 6 (six) hours as needed for wheezing or shortness of breath.  . ALPRAZolam (XANAX) 0.25 MG tablet Take 0.25 mg by mouth 2 (two) times daily as needed.  .Marland Kitchenaspirin 325 MG tablet Take 325 mg by mouth at bedtime.   . calcitRIOL (ROCALTROL) 0.25 MCG capsule Take 0.25 mcg by mouth daily.  . cholecalciferol (VITAMIN D) 1000 units tablet Take 1,000 Units by mouth daily.  . enalapril (VASOTEC) 5 MG tablet Take 5 mg by mouth daily.  .Marland Kitchengabapentin (NEURONTIN) 300 MG capsule Take 300-600 mg by mouth 3 (three) times daily. Pt takes one capsule in the morning, one at noon, and two at bedtime.  .Marland KitchenglipiZIDE (GLUCOTROL) 5 MG tablet Take 5 mg by mouth 2 (two) times daily before a meal.  . loratadine (CLARITIN) 10 MG tablet Take 10 mg by mouth 2 (two) times daily.   .Marland Kitchenlovastatin (MEVACOR) 40 MG tablet Take 80 mg by mouth at bedtime.  . methadone (DOLOPHINE) 10 MG tablet Take 2 tablets (20 mg total) by mouth 4 (four) times daily.  . metolazone (ZAROXOLYN) 2.5 MG tablet Take 2.5 mg by mouth daily as needed (when pts weight reaches 157 or greater).   . metoprolol tartrate (LOPRESSOR)  25 MG tablet Take 0.5 tablets (12.5 mg total) by mouth 2 (two) times daily.  . mirtazapine (REMERON) 30 MG tablet Take 15 mg by mouth at bedtime.  . nitroGLYCERIN (NITROSTAT) 0.4 MG SL tablet Place 0.4 mg under the tongue every 5 (five) minutes as needed for chest pain.   . pantoprazole (PROTONIX) 40 MG tablet Take 1 tablet (40 mg total) by mouth 2 (two) times daily.  . potassium chloride SA (K-DUR,KLOR-CON) 20 MEQ tablet Take 20 mEq by mouth as needed (when taking Metolazone).   . Tiotropium Bromide-Olodaterol (STIOLTO RESPIMAT) 2.5-2.5 MCG/ACT AERS Inhale 2 puffs into the lungs daily.  Marland Kitchen torsemide (DEMADEX) 20 MG tablet Take 20-40 mg by mouth 2 (two) times daily. Pt takes two tablets in the morning and one in the evening.  . [DISCONTINUED] metFORMIN (GLUCOPHAGE) 1000 MG tablet Take 0.5 tablets (500 mg  total) by mouth 2 (two) times daily with a meal.  . [DISCONTINUED] Vitamin D, Ergocalciferol, (DRISDOL) 50000 units CAPS capsule Take 50,000 Units by mouth every 7 (seven) days. Pt takes on Monday.   No facility-administered encounter medications on file as of 02/27/2016.    Activities of Daily Living In your present state of health, do you have any difficulty performing the following activities: 02/27/2016 10/19/2015  Hearing? N N  Vision? N N  Difficulty concentrating or making decisions? N N  Walking or climbing stairs? Y N  Dressing or bathing? N N  Doing errands, shopping? N N  Preparing Food and eating ? N -  Using the Toilet? N -  In the past six months, have you accidently leaked urine? N -  Do you have problems with loss of bowel control? N -  Managing your Medications? N -  Managing your Finances? N -  Housekeeping or managing your Housekeeping? N -    Patient Care Team: Venia Carbon, MD as PCP - General Jolaine Artist, MD as Consulting Physician (Cardiology) Juanito Doom, MD as Consulting Physician (Pulmonary Disease) Anthonette Legato, MD as Consulting Physician (Internal Medicine) Lupita Raider, DO as Consulting Physician (Optometry)    Assessment:    Vision Screening Comments: Last eye exam in Feb 2017 Exercise Activities and Dietary recommendations Current Exercise Habits: The patient does not participate in regular exercise at present, Exercise limited by: respiratory conditions(s);orthopedic condition(s)  Goals    . Increase physical activity     Starting 02/27/2016, I will attempt to do at least 5 minutes of chair exercises daily.       Fall Risk Fall Risk  02/27/2016 02/27/2016 05/10/2015 05/10/2015 04/11/2015  Falls in the past year? No No - No No  Risk for fall due to : - - (No Data) Medication side effect Medication side effect  Risk for fall due to (comments): - - States she has a fear of falling.  Education provided on measures to decrease risk -  using night light, removing clutter and scatter rugs. - -   Depression Screen PHQ 2/9 Scores 02/27/2016 02/27/2016 05/10/2015 05/10/2015  PHQ - 2 Score 0 0 1 2  PHQ- 9 Score - - - 3     Cognitive Testing MMSE - Mini Mental State Exam 02/27/2016  Orientation to time 5  Orientation to Place 5  Registration 3  Attention/ Calculation 0  Recall 3  Language- name 2 objects 0  Language- repeat 1  Language- follow 3 step command 3  Language- read & follow direction 0  Write a sentence 0  Copy  design 0  Total score 20   PLEASE NOTE: A Mini-Cog screen was completed. Maximum score is 20. A value of 0 denotes this part of [place] MMSE was not completed.  Orientation to Time - Max 5 Orientation to Place - Max 5 Registration - Max 3 Recall - Max 3 Language Repeat - Max 1 Language Follow 3 Step Command - Max 3   Immunization History  Administered Date(s) Administered  . Influenza Split 08/13/2011  . Influenza Whole 09/16/2007, 08/17/2009, 07/31/2010  . Influenza,inj,Quad PF,36+ Mos 07/12/2013, 08/09/2014, 08/10/2015  . Pneumococcal Conjugate-13 11/08/2014  . Pneumococcal Polysaccharide-23 08/05/2002, 02/27/2016  . Td 05/19/2006  . Zoster 11/23/2014   Screening Tests Health Maintenance  Topic Date Due  . FOOT EXAM  02/27/2016 (Originally 11/09/2015)  . TETANUS/TDAP  05/19/2016  . INFLUENZA VACCINE  05/28/2016  . HEMOGLOBIN A1C  07/26/2016  . OPHTHALMOLOGY EXAM  08/21/2016  . MAMMOGRAM  02/13/2018  . COLONOSCOPY  09/09/2018  . PNEUMOCOCCAL POLYSACCHARIDE VACCINE  Completed  . ZOSTAVAX  Completed  . Hepatitis C Screening  Completed  . HIV Screening  Completed      Plan:     I have personally reviewed and addressed the Medicare Annual Wellness questionnaire and have noted the following in the patient's chart:  A. Medical and social history B. Use of alcohol, tobacco or illicit drugs  C. Current medications and supplements D. Functional ability and status E.  Nutritional  status F.  Physical activity G. Advance directives H. List of other physicians I.  Hospitalizations, surgeries, and ER visits in previous 12 months J.  [place] to include hearing, vision, cognitive, depression L. Referrals and appointments - none  In addition, I have reviewed and discussed with patient certain preventive protocols, quality metrics, and best practice recommendations. A written personalized care plan for preventive services as well as general preventive health recommendations were provided to patient.  See attached scanned questionnaire for additional information.   Signed,   Lindell Noe, MHA, BS, LPN Health Advisor 0/11/889

## 2016-02-27 NOTE — Patient Instructions (Signed)
Please start the lantus insulin with 10 units every morning. Every 2-3 days, go up by 2 units until your fasting sugars are regularly under 160. You may need 30 or more units daily (or even much more) Only take the alprazolam as needed for increased anxiety.

## 2016-02-27 NOTE — Assessment & Plan Note (Signed)
COPD and CHF Better fluid status Breathing is okay on the oxygen

## 2016-02-27 NOTE — Progress Notes (Signed)
Pre visit review using our clinic review tool, if applicable. No additional management support is needed unless otherwise documented below in the visit note.

## 2016-02-27 NOTE — Progress Notes (Signed)
Subjective:    Patient ID: Lindsay Harmon, female    DOB: 17-Dec-1955, 60 y.o.   MRN: [id number]  HPI Here for follow up of diabetes and other chronic medical conditions  Still having high sugars Running as high as 250's --now since off metformin Off metformin due to renal insufficiency Did increase the glipizide to 5 bid No sores or pain in feet  Fluid status seems stable Sleeps in chair-- no PND Edema is gone Can do ADLs without stopping due to dyspnea (though has to stop due to back at times) Continues on oxygen all the time No chest pain  No palpitations  Mood is okay Uses the alprazolam every morning still  No depressed   Current Outpatient Prescriptions on File Prior to Visit  Medication Sig Dispense Refill  . albuterol (PROVENTIL HFA;VENTOLIN HFA) 108 (90 Base) MCG/ACT inhaler Inhale 2 puffs into the lungs every 6 (six) hours as needed for wheezing or shortness of breath. 1 Inhaler 2  . ALPRAZolam (XANAX) 0.25 MG tablet Take 0.25 mg by mouth daily.    Marland Kitchen aspirin 325 MG tablet Take 325 mg by mouth at bedtime.     . calcitRIOL (ROCALTROL) 0.25 MCG capsule Take 0.25 mcg by mouth daily.    . cholecalciferol (VITAMIN D) 1000 units tablet Take 1,000 Units by mouth daily.    . enalapril (VASOTEC) 5 MG tablet Take 5 mg by mouth daily.    Marland Kitchen gabapentin (NEURONTIN) 300 MG capsule Take 300-600 mg by mouth 3 (three) times daily. Pt takes one capsule in the morning, one at noon, and two at bedtime.    Marland Kitchen glipiZIDE (GLUCOTROL) 5 MG tablet Take 5 mg by mouth 2 (two) times daily before a meal.    . loratadine (CLARITIN) 10 MG tablet Take 10 mg by mouth 2 (two) times daily.     Marland Kitchen lovastatin (MEVACOR) 40 MG tablet Take 80 mg by mouth at bedtime.    . methadone (DOLOPHINE) 10 MG tablet Take 2 tablets (20 mg total) by mouth 4 (four) times daily. 240 tablet 0  . metolazone (ZAROXOLYN) 2.5 MG tablet Take 2.5 mg by mouth daily as needed (when pts weight reaches 157 or greater).     . metoprolol  tartrate (LOPRESSOR) 25 MG tablet Take 0.5 tablets (12.5 mg total) by mouth 2 (two) times daily. 90 tablet 3  . mirtazapine (REMERON) 30 MG tablet Take 15 mg by mouth at bedtime.    . nitroGLYCERIN (NITROSTAT) 0.4 MG SL tablet Place 0.4 mg under the tongue every 5 (five) minutes as needed for chest pain.     . pantoprazole (PROTONIX) 40 MG tablet Take 1 tablet (40 mg total) by mouth 2 (two) times daily. 180 tablet 3  . potassium chloride SA (K-DUR,KLOR-CON) 20 MEQ tablet Take 20 mEq by mouth as needed (when taking Metolazone).     . Tiotropium Bromide-Olodaterol (STIOLTO RESPIMAT) 2.5-2.5 MCG/ACT AERS Inhale 2 puffs into the lungs daily. 4 g 11  . torsemide (DEMADEX) 20 MG tablet Take 20-40 mg by mouth 2 (two) times daily. Pt takes two tablets in the morning and one in the evening.     No current facility-administered medications on file prior to visit.    No Known Allergies  Past Medical History  Diagnosis Date  . Depression   . GERD (gastroesophageal reflux disease)   . Hyperlipidemia   . Hypertension   . PVD (peripheral vascular disease) ([place])   . CAD (coronary artery disease)   .  Familial hematuria   . NIDDM (non-insulin dependent diabetes mellitus)     with neuropathy  . IBS (irritable bowel syndrome)   . Esophageal stricture   . Obesity, unspecified   . Nephrolithiasis   . Allergy   . Arthritis   . Pulmonary hypertension [facility])     Past Surgical History  Procedure Laterality Date  . Abdominal hysterectomy    . Cesarean section    . Carpal tunnel release    . Right heart catheterization N/A 12/05/2014    Procedure: RIGHT HEART CATH;  Surgeon: Sinclair Grooms, MD;  Location: [facility] CATH LAB;  Service: Cardiovascular;  Laterality: N/A;    Family History  Problem Relation Age of Onset  . Diabetes Mother   . Cancer Mother     ovarian,melanoma  . Emphysema Paternal Grandfather   . Allergies Sister   . Allergies Father   . Cancer Father     lung  . Cancer Maternal  Grandmother     uterine  . Breast cancer Neg Hx     Social History   Social History  . Marital Status: Legally Separated    Spouse Name: N/A  . Number of Children: 1  . Years of Education: N/A   Occupational History  . 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 Smoking status: Former Smoker -- 1.50 packs/day for 20 years    Types: Cigarettes    Quit date: 01/04/2009  . Smokeless tobacco: Never Used  . Alcohol Use: No     Comment: heavy in the past  . Drug Use: No  . Sexual Activity: No   Other Topics Concern  . Not on file   Social History Narrative   No living will   Son [name] should make decisions for her if she is unable.    Would accept resuscitation attempts but no prolonged ventilation   Not sure about tube feeds but probably wouldn't want them if cognitively unaware   Review of Systems  Appetite not big Weight is stable Sleeps okay--may be better     Objective:   Physical Exam  Constitutional: She appears well-developed. No distress.  Neck: Normal range of motion. No thyromegaly present.  Cardiovascular: Normal rate, regular rhythm and normal heart sounds.  Exam reveals no gallop.   No murmur heard. Feet reasonably warm but no palpable pulses  Pulmonary/Chest: Effort normal and breath sounds normal. No respiratory distress. She has no wheezes. She has no rales.  Musculoskeletal: She exhibits no edema.  Lymphadenopathy:    She has no cervical adenopathy.  Neurological:  Normal sensation in feet  Skin:  Mild plantar callous but no ulcers  Psychiatric: She has a normal mood and affect. Her behavior is normal.          Assessment & Plan:

## 2016-02-27 NOTE — Assessment & Plan Note (Signed)
Compensated now 

## 2016-02-27 NOTE — Assessment & Plan Note (Signed)
Will have her change to prn for alprazolam

## 2016-02-27 NOTE — Assessment & Plan Note (Signed)
Really worse off the metformin (which she had to stop with CKD 4 Will add lantus and titrate

## 2016-02-27 NOTE — Assessment & Plan Note (Signed)
No ACEI/ARB per nephrology

## 2016-02-28 NOTE — Progress Notes (Signed)
Subjective:    Patient ID: Lindsay Harmon, female    DOB: [birth year], 60 y.o.   MRN: [id number]  HPI  I reviewed health advisor's note, was available for consultation, and agree with documentation and plan.   Review of Systems     Objective:   Physical Exam        Assessment & Plan:

## 2016-02-29 ENCOUNTER — Other Ambulatory Visit: Payer: Self-pay

## 2016-02-29 MED ORDER — GLIPIZIDE 5 MG PO TABS: 5.0000 mg | ORAL_TABLET | Freq: Two times a day (BID) | ORAL | Status: DC

## 2016-02-29 NOTE — Telephone Encounter (Signed)
Pt request [facility]. Pt already has acct set up with pharmacy. Refill done per protocol and pt voiced understanding. Last seen 02/27/16.

## 2016-03-05 ENCOUNTER — Ambulatory Visit (INDEPENDENT_AMBULATORY_CARE_PROVIDER_SITE_OTHER): Payer: PPO | Admitting: Pulmonary Disease

## 2016-03-05 ENCOUNTER — Encounter: Payer: Self-pay | Admitting: Pulmonary Disease

## 2016-03-05 VITALS — BP 134/72 | HR 99 | Ht 61.0 in | Wt 156.0 lb

## 2016-03-05 DIAGNOSIS — J439 Emphysema, unspecified: Secondary | ICD-10-CM

## 2016-03-05 DIAGNOSIS — R040 Epistaxis: Secondary | ICD-10-CM

## 2016-03-05 NOTE — Assessment & Plan Note (Signed)
Stable COPD Plan: It is nice to meet you today. Continue your Stialto 2 puffs once daily Rinse your mouth/ brush your teeth after use. Continue your Proventil inhaler as needed for shortness of breath or wheezing up to every 6 hours.  Continue your oxygen 2L at rest and 3L with exertion and sleep. Follow up with Dr. Lake Bells in 6 months Please contact office for sooner follow up if symptoms do not improve or worsen or seek emergency care

## 2016-03-05 NOTE — Assessment & Plan Note (Signed)
Recent episode resolved Plan: Continue using humidification with oxygen

## 2016-03-05 NOTE — Progress Notes (Signed)
Subjective:    Patient ID: Lindsay Harmon, female    DOB: 02-19-1956, 60 y.o.   MRN: [id number]  HPI   Synopsis: Referred by cardiology in 2016 for evaluation of severe COPD and possible obstructive sleep apnea in the setting of pulmonary hypertension. She also has congestive heart failure. A sleep study in 2016 showed no evidence of obstructive sleep apnea but she does have significant nocturnal hypoxemia. Spirometry testing in 2016 confirmed a diagnosis of COPD with severe airflow obstruction FEV1 41% pred.   03/05/2016 Follow Up Appointment: 6 months Pt presents to the office today for 6 month follow up. She is doing well. No exacerbations recently. She is up to date on her vaccines , both flu and pneumonia. Weight is stable.She is using the infection prevention interventions Dr. Lake Bells taught her.She is compliant with her Stialto daily, she rarely has to use her rescue inhaler.She is wearing her oxygen 2L at rest and 3L with exertion and at bedtime. No chest pain,no cough,no sputum production,no orthopnea, hemoptysis , leg or calf pain.She did have an ED visit for epistaxis which was resolved with cauterizing.She has recently been started on Insulin by her PCP.   Current outpatient prescriptions:  .  albuterol (PROVENTIL HFA;VENTOLIN HFA) 108 (90 Base) MCG/ACT inhaler, Inhale 2 puffs into the lungs every 6 (six) hours as needed for wheezing or shortness of breath., Disp: 1 Inhaler, Rfl: 2 .  ALPRAZolam (XANAX) 0.25 MG tablet, Take 0.25 mg by mouth daily as needed. , Disp: , Rfl:  .  aspirin 325 MG tablet, Take 325 mg by mouth at bedtime. , Disp: , Rfl:  .  calcitRIOL (ROCALTROL) 0.25 MCG capsule, Take 0.25 mcg by mouth daily., Disp: , Rfl:  .  cholecalciferol (VITAMIN D) 1000 units tablet, Take 1,000 Units by mouth daily., Disp: , Rfl:  .  enalapril (VASOTEC) 5 MG tablet, Take 5 mg by mouth daily., Disp: , Rfl:  .  gabapentin (NEURONTIN) 300 MG capsule, Take 300-600 mg by mouth 3 (three)  times daily. Pt takes one capsule in the morning, one at noon, and two at bedtime., Disp: , Rfl:  .  glipiZIDE (GLUCOTROL) 5 MG tablet, Take 1 tablet (5 mg total) by mouth 2 (two) times daily before a meal., Disp: 180 tablet, Rfl: 1 .  Insulin Glargine (LANTUS SOLOSTAR) 100 UNIT/ML Solostar Pen, Inject 10-30 Units into the skin daily., Disp: 15 mL, Rfl: 11 .  loratadine (CLARITIN) 10 MG tablet, Take 10 mg by mouth 2 (two) times daily. , Disp: , Rfl:  .  lovastatin (MEVACOR) 40 MG tablet, Take 80 mg by mouth at bedtime., Disp: , Rfl:  .  methadone (DOLOPHINE) 10 MG tablet, Take 2 tablets (20 mg total) by mouth 4 (four) times daily., Disp: 240 tablet, Rfl: 0 .  metolazone (ZAROXOLYN) 2.5 MG tablet, Take 2.5 mg by mouth daily as needed (when pts weight reaches 157 or greater). , Disp: , Rfl:  .  metoprolol tartrate (LOPRESSOR) 25 MG tablet, Take 0.5 tablets (12.5 mg total) by mouth 2 (two) times daily., Disp: 90 tablet, Rfl: 3 .  mirtazapine (REMERON) 30 MG tablet, Take 15 mg by mouth at bedtime., Disp: , Rfl:  .  nitroGLYCERIN (NITROSTAT) 0.4 MG SL tablet, Place 0.4 mg under the tongue every 5 (five) minutes as needed for chest pain. , Disp: , Rfl:  .  pantoprazole (PROTONIX) 40 MG tablet, Take 1 tablet (40 mg total) by mouth 2 (two) times daily., Disp: 180 tablet,  Rfl: 3 .  potassium chloride SA (K-DUR,KLOR-CON) 20 MEQ tablet, Take 20 mEq by mouth as needed (when taking Metolazone). , Disp: , Rfl:  .  Tiotropium Bromide-Olodaterol (STIOLTO RESPIMAT) 2.5-2.5 MCG/ACT AERS, Inhale 2 puffs into the lungs daily., Disp: 4 g, Rfl: 11 .  torsemide (DEMADEX) 20 MG tablet, Take 20-40 mg by mouth 2 (two) times daily. Pt takes two tablets in the morning and one in the evening., Disp: , Rfl:    Past Medical History  Diagnosis Date  . Depression   . GERD (gastroesophageal reflux disease)   . Hyperlipidemia   . Hypertension   . PVD (peripheral vascular disease) ([place])   . CAD (coronary artery disease)   .  Familial hematuria   . NIDDM (non-insulin dependent diabetes mellitus)     with neuropathy  . IBS (irritable bowel syndrome)   . Esophageal stricture   . Obesity, unspecified   . Nephrolithiasis   . Allergy   . Arthritis   . Pulmonary hypertension ([facility])     No Known Allergies   Review of Systems Constitutional:   No  weight loss, night sweats,  Fevers, chills, fatigue, or  lassitude.  HEENT:   No headaches,  Difficulty swallowing,  Tooth/dental problems, or  Sore throat,                No sneezing, itching, ear ache, nasal congestion, post nasal drip,   CV:  No chest pain,  Orthopnea, PND, swelling in lower extremities, anasarca, dizziness, palpitations, syncope.   GI  No heartburn, indigestion, abdominal pain, nausea, vomiting, diarrhea, change in bowel habits, loss of appetite, bloody stools.   Resp: + shortness of breath with exertion less at rest.  No excess mucus, no productive cough,  No non-productive cough,  No coughing up of blood.  No change in color of mucus.  No wheezing.  No chest wall deformity  Skin: no rash or lesions.  GU: no dysuria, change in color of urine, no urgency or frequency.  No flank pain, no hematuria   MS:  No joint pain or swelling.  No decreased range of motion.  No back pain.  Psych:  No change in mood or affect. No depression or anxiety.  No memory loss.        Objective:   Physical Exam BP 134/72 mmHg  Pulse 99  Ht _0  (1.549 m)  Wt 156 lb (70.761 kg)  BMI 29.49 kg/m2  SpO2 93%   Physical Exam:  General- No distress,  A&Ox3, wearing oxygen ENT: No sinus tenderness, TM clear, pale nasal mucosa, no oral exudate,no post nasal drip, no LAN Cardiac: S1, S2, regular rate and rhythm, no murmur Chest: No wheeze/ rales/ dullness; no accessory muscle use, no nasal flaring, no sternal retractions Abd.: Soft Non-tender Ext: No clubbing cyanosis, edema Neuro:  normal strength Skin: No rashes, warm and dry Psych: normal mood and  behavior  Magdalen Spatz, AGACNP-BC [place]. 03/05/2016    Assessment & Plan:   Stable COPD Plan: It is nice to meet you today. Continue your Stialto 2 puffs once daily Rinse your mouth/ brush your teeth after use. Continue your Proventil inhaler as needed for shortness of breath or wheezing up to every 6 hours.  Continue your oxygen 2L at rest and 3L with exertion and sleep. Follow up with Dr. Lake Bells in 6 months Please contact office for sooner follow up if symptoms do not improve or worsen or seek emergency care  Attending I have seen and examined the patient with nurse practitioner/resident and agree with the note above.  We formulated the plan together and I elicited the following history.    Agree with the above note and plan  Roselie Awkward, MD [place] [facility] Pager: [phone] Cell: [phone] After 3pm or if no response, call [phone]

## 2016-03-05 NOTE — Patient Instructions (Addendum)
It is nice to meet you today. Continue your Stialto 2 puffs once daily Rinse your mouth/ brush your teeth after use. Continue your Proventil inhaler as needed for shortness of breath or wheezing up to every 6 hours.  Continue your oxygen 2L at rest and 3L with exertion and sleep. Follow up with Dr. Lake Bells in 6 months Please contact office for sooner follow up if symptoms do not improve or worsen or seek emergency care

## 2016-03-06 ENCOUNTER — Other Ambulatory Visit: Payer: Self-pay

## 2016-03-06 DIAGNOSIS — J449 Chronic obstructive pulmonary disease, unspecified: Secondary | ICD-10-CM | POA: Diagnosis not present

## 2016-03-06 DIAGNOSIS — I509 Heart failure, unspecified: Secondary | ICD-10-CM | POA: Diagnosis not present

## 2016-03-06 MED ORDER — METHADONE HCL 10 MG PO TABS: 20.0000 mg | ORAL_TABLET | Freq: Four times a day (QID) | ORAL | Status: DC

## 2016-03-06 NOTE — Telephone Encounter (Signed)
Pt request rx methadone. Call when ready for pick up. Last printed # 240 on 02/02/16; last seen 02/27/16.

## 2016-03-07 NOTE — Telephone Encounter (Signed)
The rx was printed late yesterday after he left. He will sign it when he returns this afternoon.

## 2016-03-07 NOTE — Telephone Encounter (Signed)
Spoke to patient. Rx up front for pickup

## 2016-03-19 ENCOUNTER — Other Ambulatory Visit: Payer: Self-pay | Admitting: *Deleted

## 2016-03-19 MED ORDER — TIOTROPIUM BROMIDE-OLODATEROL 2.5-2.5 MCG/ACT IN AERS: 2.0000 | INHALATION_SPRAY | Freq: Every day | RESPIRATORY_TRACT | Status: DC

## 2016-04-06 DIAGNOSIS — I509 Heart failure, unspecified: Secondary | ICD-10-CM | POA: Diagnosis not present

## 2016-04-06 DIAGNOSIS — J449 Chronic obstructive pulmonary disease, unspecified: Secondary | ICD-10-CM | POA: Diagnosis not present

## 2016-04-09 ENCOUNTER — Other Ambulatory Visit: Payer: Self-pay

## 2016-04-09 NOTE — Telephone Encounter (Signed)
Pt left v/m requesting rx methadone. Call when ready for pick up. Last printed # 240 on 03/06/16; last seen 02/27/16.

## 2016-04-10 MED ORDER — METHADONE HCL 10 MG PO TABS: 20.0000 mg | ORAL_TABLET | Freq: Four times a day (QID) | ORAL | Status: DC

## 2016-04-10 NOTE — Telephone Encounter (Signed)
Pt aware.

## 2016-04-10 NOTE — Telephone Encounter (Signed)
Rx up front for pickup. No DPR stating I could leave a message, so I left a message asking pt to call back. Please let her know her rx is up front.

## 2016-04-17 ENCOUNTER — Ambulatory Visit (INDEPENDENT_AMBULATORY_CARE_PROVIDER_SITE_OTHER): Payer: PPO | Admitting: Internal Medicine

## 2016-04-17 ENCOUNTER — Encounter: Payer: Self-pay | Admitting: Internal Medicine

## 2016-04-17 VITALS — BP 118/60 | HR 75 | Temp 98.1°F | Wt 160.0 lb

## 2016-04-17 DIAGNOSIS — IMO0002 Reserved for concepts with insufficient information to code with codable children: Secondary | ICD-10-CM

## 2016-04-17 DIAGNOSIS — E1165 Type 2 diabetes mellitus with hyperglycemia: Secondary | ICD-10-CM

## 2016-04-17 DIAGNOSIS — E1151 Type 2 diabetes mellitus with diabetic peripheral angiopathy without gangrene: Secondary | ICD-10-CM

## 2016-04-17 NOTE — Assessment & Plan Note (Signed)
Has tolerated lantus well Doing a good job of titrating up lantus Discussed goal for averaging under 140 fasting---without hypoglycemia Counseled all of 15 minute visit

## 2016-04-17 NOTE — Progress Notes (Signed)
Pre visit review using our clinic review tool, if applicable. No additional management support is needed unless otherwise documented below in the visit note.

## 2016-04-17 NOTE — Progress Notes (Signed)
Subjective:    Patient ID: Lindsay Harmon, female    DOB: [birth year], 60 y.o.   MRN: [id number]  HPI Here for follow up of diabetes  Has adjusted to doing the shots of lantus Sugars "are quirky"--- up and down Checking fasting daily Has been increasing lantus by 2 units every few days Sugars now mostly in mid 100's--- 130-170 Rarely under 120 No hypoglycemic spells  Current Outpatient Prescriptions on File Prior to Visit  Medication Sig Dispense Refill  . albuterol (PROVENTIL HFA;VENTOLIN HFA) 108 (90 Base) MCG/ACT inhaler Inhale 2 puffs into the lungs every 6 (six) hours as needed for wheezing or shortness of breath. 1 Inhaler 2  . ALPRAZolam (XANAX) 0.25 MG tablet Take 0.25 mg by mouth daily as needed.     Marland Kitchen aspirin 325 MG tablet Take 325 mg by mouth at bedtime.     . calcitRIOL (ROCALTROL) 0.25 MCG capsule Take 0.25 mcg by mouth daily.    . cholecalciferol (VITAMIN D) 1000 units tablet Take 1,000 Units by mouth daily.    . enalapril (VASOTEC) 5 MG tablet Take 5 mg by mouth daily.    Marland Kitchen gabapentin (NEURONTIN) 300 MG capsule Take 300-600 mg by mouth 3 (three) times daily. Pt takes one capsule in the morning, one at noon, and two at bedtime.    Marland Kitchen glipiZIDE (GLUCOTROL) 5 MG tablet Take 1 tablet (5 mg total) by mouth 2 (two) times daily before a meal. 180 tablet 1  . Insulin Glargine (LANTUS SOLOSTAR) 100 UNIT/ML Solostar Pen Inject 10-30 Units into the skin daily. 15 mL 11  . loratadine (CLARITIN) 10 MG tablet Take 10 mg by mouth 2 (two) times daily.     Marland Kitchen lovastatin (MEVACOR) 40 MG tablet Take 80 mg by mouth at bedtime.    . methadone (DOLOPHINE) 10 MG tablet Take 2 tablets (20 mg total) by mouth 4 (four) times daily. 240 tablet 0  . metolazone (ZAROXOLYN) 2.5 MG tablet Take 2.5 mg by mouth daily as needed (when pts weight reaches 157 or greater).     . metoprolol tartrate (LOPRESSOR) 25 MG tablet Take 0.5 tablets (12.5 mg total) by mouth 2 (two) times daily. 90 tablet 3  .  mirtazapine (REMERON) 30 MG tablet Take 15 mg by mouth at bedtime.    . nitroGLYCERIN (NITROSTAT) 0.4 MG SL tablet Place 0.4 mg under the tongue every 5 (five) minutes as needed for chest pain.     . pantoprazole (PROTONIX) 40 MG tablet Take 1 tablet (40 mg total) by mouth 2 (two) times daily. 180 tablet 3  . potassium chloride SA (K-DUR,KLOR-CON) 20 MEQ tablet Take 20 mEq by mouth as needed (when taking Metolazone).     . Tiotropium Bromide-Olodaterol (STIOLTO RESPIMAT) 2.5-2.5 MCG/ACT AERS Inhale 2 puffs into the lungs daily. 4 g 5  . torsemide (DEMADEX) 20 MG tablet Take 20-40 mg by mouth 2 (two) times daily. Pt takes two tablets in the morning and one in the evening.     No current facility-administered medications on file prior to visit.    No Known Allergies  Past Medical History  Diagnosis Date  . Depression   . GERD (gastroesophageal reflux disease)   . Hyperlipidemia   . Hypertension   . PVD (peripheral vascular disease) ([place])   . CAD (coronary artery disease)   . Familial hematuria   . NIDDM (non-insulin dependent diabetes mellitus)     with neuropathy  . IBS (irritable bowel syndrome)   .  Esophageal stricture   . Obesity, unspecified   . Nephrolithiasis   . Allergy   . Arthritis   . Pulmonary hypertension [facility])     Past Surgical History  Procedure Laterality Date  . Abdominal hysterectomy    . Cesarean section    . Carpal tunnel release    . Right heart catheterization N/A 12/05/2014    Procedure: RIGHT HEART CATH;  Surgeon: Sinclair Grooms, MD;  Location: [facility] CATH LAB;  Service: Cardiovascular;  Laterality: N/A;    Family History  Problem Relation Age of Onset  . Diabetes Mother   . Cancer Mother     ovarian,melanoma  . Emphysema Paternal Grandfather   . Allergies Sister   . Allergies Father   . Cancer Father     lung  . Cancer Maternal Grandmother     uterine  . Breast cancer Neg Hx     Social History   Social History  . Marital Status: Legally  Separated    Spouse Name: N/A  . Number of Children: 1  . Years of Education: N/A   Occupational History  . 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 Smoking status: Former Smoker -- 1.50 packs/day for 20 years    Types: Cigarettes    Quit date: 01/04/2009  . Smokeless tobacco: Never Used  . Alcohol Use: No     Comment: heavy in the past  . Drug Use: No  . Sexual Activity: No   Other Topics Concern  . Not on file   Social History Narrative   No living will   Son [name] should make decisions for her if she is unable.    Would accept resuscitation attempts but no prolonged ventilation   Not sure about tube feeds but probably wouldn't want them if cognitively unaware     Review of Systems Weights are very stable--checks daily Breathing is fine    Objective:   Physical Exam  Constitutional: She appears well-developed. No distress.  Psychiatric: She has a normal mood and affect. Her behavior is normal.          Assessment & Plan:

## 2016-04-17 NOTE — Patient Instructions (Signed)
Please continue to slowly raise the lantus dose till you are averaging under 140 (without low sugar reactions).

## 2016-05-01 ENCOUNTER — Ambulatory Visit: Payer: PPO | Admitting: Internal Medicine

## 2016-05-02 ENCOUNTER — Ambulatory Visit (INDEPENDENT_AMBULATORY_CARE_PROVIDER_SITE_OTHER): Payer: PPO | Admitting: Internal Medicine

## 2016-05-02 ENCOUNTER — Encounter: Payer: Self-pay | Admitting: Internal Medicine

## 2016-05-02 VITALS — BP 112/60 | HR 87 | Temp 98.4°F | Wt 161.0 lb

## 2016-05-02 DIAGNOSIS — F330 Major depressive disorder, recurrent, mild: Secondary | ICD-10-CM

## 2016-05-02 MED ORDER — METHADONE HCL 10 MG PO TABS: 20.0000 mg | ORAL_TABLET | Freq: Four times a day (QID) | ORAL | Status: DC

## 2016-05-02 MED ORDER — SERTRALINE HCL 25 MG PO TABS: 25.0000 mg | ORAL_TABLET | Freq: Every day | ORAL | Status: DC

## 2016-05-02 NOTE — Assessment & Plan Note (Addendum)
Clearly worse with daily symptoms for 2 weeks Discussed getting out more--back to church, etc Will continue [employer] low dose sertraline--discussed side effects including suicidal thoughts

## 2016-05-02 NOTE — Progress Notes (Signed)
Pre visit review using our clinic review tool, if applicable. No additional management support is needed unless otherwise documented below in the visit note.

## 2016-05-02 NOTE — Patient Instructions (Signed)
Please let me know if you have any problems with the new medication.

## 2016-05-02 NOTE — Progress Notes (Signed)
Subjective:    Patient ID: Lindsay Harmon, female    DOB: 1956/06/14, 60 y.o.   MRN: [id number]  HPI Here for [employer] form Also concerned due to depression acting up again--just in the past 2 weeks Has lost interest in things Not crying but "out of it" No thoughts of dying or suicide Anhedonic and lost interest in her hobbies (making cards) Eating as her usual Not really anxious--hasn't needed the alprazolam  Current Outpatient Prescriptions on File Prior to Visit  Medication Sig Dispense Refill  . albuterol (PROVENTIL HFA;VENTOLIN HFA) 108 (90 Base) MCG/ACT inhaler Inhale 2 puffs into the lungs every 6 (six) hours as needed for wheezing or shortness of breath. 1 Inhaler 2  . ALPRAZolam (XANAX) 0.25 MG tablet Take 0.25 mg by mouth daily as needed.     Marland Kitchen aspirin 325 MG tablet Take 325 mg by mouth at bedtime.     . calcitRIOL (ROCALTROL) 0.25 MCG capsule Take 0.25 mcg by mouth daily.    . cholecalciferol (VITAMIN D) 1000 units tablet Take 1,000 Units by mouth daily.    . enalapril (VASOTEC) 5 MG tablet Take 5 mg by mouth daily.    Marland Kitchen gabapentin (NEURONTIN) 300 MG capsule Take 300-600 mg by mouth 3 (three) times daily. Pt takes one capsule in the morning, one at noon, and two at bedtime.    Marland Kitchen glipiZIDE (GLUCOTROL) 5 MG tablet Take 1 tablet (5 mg total) by mouth 2 (two) times daily before a meal. 180 tablet 1  . Insulin Glargine (LANTUS SOLOSTAR) 100 UNIT/ML Solostar Pen Inject 10-30 Units into the skin daily. 15 mL 11  . loratadine (CLARITIN) 10 MG tablet Take 10 mg by mouth 2 (two) times daily.     Marland Kitchen lovastatin (MEVACOR) 40 MG tablet Take 80 mg by mouth at bedtime.    . methadone (DOLOPHINE) 10 MG tablet Take 2 tablets (20 mg total) by mouth 4 (four) times daily. 240 tablet 0  . metolazone (ZAROXOLYN) 2.5 MG tablet Take 2.5 mg by mouth daily as needed (when pts weight reaches 157 or greater).     . metoprolol tartrate (LOPRESSOR) 25 MG tablet Take 0.5 tablets (12.5 mg total) by mouth  2 (two) times daily. 90 tablet 3  . mirtazapine (REMERON) 30 MG tablet Take 15 mg by mouth at bedtime.    . nitroGLYCERIN (NITROSTAT) 0.4 MG SL tablet Place 0.4 mg under the tongue every 5 (five) minutes as needed for chest pain.     . pantoprazole (PROTONIX) 40 MG tablet Take 1 tablet (40 mg total) by mouth 2 (two) times daily. 180 tablet 3  . potassium chloride SA (K-DUR,KLOR-CON) 20 MEQ tablet Take 20 mEq by mouth as needed (when taking Metolazone).     . Tiotropium Bromide-Olodaterol (STIOLTO RESPIMAT) 2.5-2.5 MCG/ACT AERS Inhale 2 puffs into the lungs daily. 4 g 5  . torsemide (DEMADEX) 20 MG tablet Take 20-40 mg by mouth 2 (two) times daily. Pt takes two tablets in the morning and one in the evening.     No current facility-administered medications on file prior to visit.    No Known Allergies  Past Medical History  Diagnosis Date  . Depression   . GERD (gastroesophageal reflux disease)   . Hyperlipidemia   . Hypertension   . PVD (peripheral vascular disease) ([place])   . CAD (coronary artery disease)   . Familial hematuria   . NIDDM (non-insulin dependent diabetes mellitus)     with neuropathy  .  IBS (irritable bowel syndrome)   . Esophageal stricture   . Obesity, unspecified   . Nephrolithiasis   . Allergy   . Arthritis   . Pulmonary hypertension [facility])     Past Surgical History  Procedure Laterality Date  . Abdominal hysterectomy    . Cesarean section    . Carpal tunnel release    . Right heart catheterization N/A 12/05/2014    Procedure: RIGHT HEART CATH;  Surgeon: Sinclair Grooms, MD;  Location: [facility] CATH LAB;  Service: Cardiovascular;  Laterality: N/A;    Family History  Problem Relation Age of Onset  . Diabetes Mother   . Cancer Mother     ovarian,melanoma  . Emphysema Paternal Grandfather   . Allergies Sister   . Allergies Father   . Cancer Father     lung  . Cancer Maternal Grandmother     uterine  . Breast cancer Neg Hx     Social History   Social  History  . Marital Status: Legally Separated    Spouse Name: N/A  . Number of Children: 1  . Years of Education: N/A   Occupational History  . 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 Smoking status: Former Smoker -- 1.50 packs/day for 20 years    Types: Cigarettes    Quit date: 01/04/2009  . Smokeless tobacco: Never Used  . Alcohol Use: No     Comment: heavy in the past  . Drug Use: No  . Sexual Activity: No   Other Topics Concern  . Not on file   Social History Narrative   No living will   Son [name] should make decisions for her if she is unable.    Would accept resuscitation attempts but no prolonged ventilation   Not sure about tube feeds but probably wouldn't want them if cognitively unaware   Review of Systems Sleeping fair No problems in family or life    Objective:   Physical Exam  Psychiatric:  Depressed mood with mild psychomotor retardation Normal speech and dress          Assessment & Plan:

## 2016-05-06 DIAGNOSIS — J449 Chronic obstructive pulmonary disease, unspecified: Secondary | ICD-10-CM | POA: Diagnosis not present

## 2016-05-06 DIAGNOSIS — I509 Heart failure, unspecified: Secondary | ICD-10-CM | POA: Diagnosis not present

## 2016-05-20 ENCOUNTER — Telehealth: Payer: Self-pay | Admitting: Internal Medicine

## 2016-05-20 NOTE — Telephone Encounter (Signed)
Pt is aware---pt is aware if Sx persist or worsen to call back---pt already has f/u appt scheduled with Dr Graciella Freer [place] verbally is okay

## 2016-05-20 NOTE — Telephone Encounter (Signed)
Have her try taking 1/2 tablet daily for the next week to see if that helps. Have her make appt with Dr. Silvio Pate early next week to discuss.

## 2016-05-20 NOTE — Telephone Encounter (Signed)
Pt l/m on triage phone stating that the sertraline (ZOLOFT) 25 MG tablet recently prescribed on 05/02/16 is "messy w/ me bad, can't sleep and having headaches"  Please advise

## 2016-05-21 NOTE — Telephone Encounter (Signed)
If she is having side effects at that dose, she should just discontinue We can discuss other alternatives at her follow up

## 2016-05-21 NOTE — Telephone Encounter (Signed)
Spoke to pt. She said she will hold off on it for now.

## 2016-05-29 ENCOUNTER — Encounter: Payer: Self-pay | Admitting: Internal Medicine

## 2016-05-29 ENCOUNTER — Ambulatory Visit (INDEPENDENT_AMBULATORY_CARE_PROVIDER_SITE_OTHER): Payer: PPO | Admitting: Internal Medicine

## 2016-05-29 VITALS — BP 132/70 | HR 80 | Temp 98.2°F | Wt 154.0 lb

## 2016-05-29 DIAGNOSIS — E1151 Type 2 diabetes mellitus with diabetic peripheral angiopathy without gangrene: Secondary | ICD-10-CM

## 2016-05-29 DIAGNOSIS — E1165 Type 2 diabetes mellitus with hyperglycemia: Secondary | ICD-10-CM

## 2016-05-29 DIAGNOSIS — F330 Major depressive disorder, recurrent, mild: Secondary | ICD-10-CM

## 2016-05-29 DIAGNOSIS — IMO0002 Reserved for concepts with insufficient information to code with codable children: Secondary | ICD-10-CM

## 2016-05-29 LAB — HEMOGLOBIN A1C: Hgb A1c MFr Bld: 8 % — ABNORMAL HIGH (ref 4.6–6.5)

## 2016-05-29 MED ORDER — METHADONE HCL 10 MG PO TABS: 20.0000 mg | ORAL_TABLET | Freq: Four times a day (QID) | ORAL | 0 refills | Status: DC

## 2016-05-29 NOTE — Assessment & Plan Note (Signed)
Sugars better now Will recheck A1c

## 2016-05-29 NOTE — Progress Notes (Signed)
Subjective:    Patient ID: Lindsay Harmon, female    DOB: 1956/09/04, 60 y.o.   MRN: [id number]  HPI Here for follow up of depression  Didn't tolerate the sertraline--- affected sleep, headaches, "wired" feeling, crazy dreams, confusion, affected appetite, couldn't be still Better since stopping it  She now feels her mood is better "Holding my own" now Is getting out of house Not anhedonic Visits family for meals and enjoys herself, etc  Sugars are better Fasting 81-134 No hypoglycemic reactions  Current Outpatient Prescriptions on File Prior to Visit  Medication Sig Dispense Refill  . albuterol (PROVENTIL HFA;VENTOLIN HFA) 108 (90 Base) MCG/ACT inhaler Inhale 2 puffs into the lungs every 6 (six) hours as needed for wheezing or shortness of breath. 1 Inhaler 2  . ALPRAZolam (XANAX) 0.25 MG tablet Take 0.25 mg by mouth daily as needed.     Marland Kitchen aspirin 325 MG tablet Take 325 mg by mouth at bedtime.     . calcitRIOL (ROCALTROL) 0.25 MCG capsule Take 0.25 mcg by mouth daily.    . cholecalciferol (VITAMIN D) 1000 units tablet Take 1,000 Units by mouth daily.    . enalapril (VASOTEC) 5 MG tablet Take 5 mg by mouth daily.    Marland Kitchen gabapentin (NEURONTIN) 300 MG capsule Take 300-600 mg by mouth 3 (three) times daily. Pt takes one capsule in the morning, one at noon, and two at bedtime.    Marland Kitchen glipiZIDE (GLUCOTROL) 5 MG tablet Take 1 tablet (5 mg total) by mouth 2 (two) times daily before a meal. 180 tablet 1  . Insulin Glargine (LANTUS SOLOSTAR) 100 UNIT/ML Solostar Pen Inject 10-30 Units into the skin daily. 15 mL 11  . loratadine (CLARITIN) 10 MG tablet Take 10 mg by mouth 2 (two) times daily.     Marland Kitchen lovastatin (MEVACOR) 40 MG tablet Take 80 mg by mouth at bedtime.    . methadone (DOLOPHINE) 10 MG tablet Take 2 tablets (20 mg total) by mouth 4 (four) times daily. Fill in about 1 week 240 tablet 0  . metolazone (ZAROXOLYN) 2.5 MG tablet Take 2.5 mg by mouth daily as needed (when pts weight reaches  157 or greater).     . metoprolol tartrate (LOPRESSOR) 25 MG tablet Take 0.5 tablets (12.5 mg total) by mouth 2 (two) times daily. 90 tablet 3  . mirtazapine (REMERON) 30 MG tablet Take 15 mg by mouth at bedtime.    . nitroGLYCERIN (NITROSTAT) 0.4 MG SL tablet Place 0.4 mg under the tongue every 5 (five) minutes as needed for chest pain.     . pantoprazole (PROTONIX) 40 MG tablet Take 1 tablet (40 mg total) by mouth 2 (two) times daily. 180 tablet 3  . potassium chloride SA (K-DUR,KLOR-CON) 20 MEQ tablet Take 20 mEq by mouth as needed (when taking Metolazone).     . Tiotropium Bromide-Olodaterol (STIOLTO RESPIMAT) 2.5-2.5 MCG/ACT AERS Inhale 2 puffs into the lungs daily. 4 g 5  . torsemide (DEMADEX) 20 MG tablet Take 20-40 mg by mouth 2 (two) times daily. Pt takes two tablets in the morning and one in the evening.    . sertraline (ZOLOFT) 25 MG tablet Take 1-2 tablets (25-50 mg total) by mouth daily. 1 daily for 1 week--then increase to 2 (Patient not taking: Reported on 05/29/2016) 30 tablet 1   No current facility-administered medications on file prior to visit.     Allergies  Allergen Reactions  . Sertraline Hcl Other (See Comments)    Malaise  Past Medical History:  Diagnosis Date  . Allergy   . Arthritis   . CAD (coronary artery disease)   . Depression   . Esophageal stricture   . Familial hematuria   . GERD (gastroesophageal reflux disease)   . Hyperlipidemia   . Hypertension   . IBS (irritable bowel syndrome)   . Nephrolithiasis   . NIDDM (non-insulin dependent diabetes mellitus)    with neuropathy  . Obesity, unspecified   . Pulmonary hypertension ([place])   . PVD (peripheral vascular disease) ([place])     Past Surgical History:  Procedure Laterality Date  . ABDOMINAL HYSTERECTOMY    . CARPAL TUNNEL RELEASE    . CESAREAN SECTION    . RIGHT HEART CATHETERIZATION N/A 12/05/2014   Procedure: RIGHT HEART CATH;  Surgeon: Sinclair Grooms, MD;  Location: [facility] CATH LAB;  Service:  Cardiovascular;  Laterality: N/A;    Family History  Problem Relation Age of Onset  . Diabetes Mother   . Cancer Mother     ovarian,melanoma  . Emphysema Paternal Grandfather   . Allergies Sister   . Allergies Father   . Cancer Father     lung  . Cancer Maternal Grandmother     uterine  . Breast cancer Neg Hx     Social History   Social History  . Marital status: Legally Separated    Spouse name: N/A  . Number of children: 1  . Years of education: N/A   Occupational History  . 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 Smoking status: Former Smoker    Packs/day: 1.50    Years: 20.00    Types: Cigarettes    Quit date: 01/04/2009  . Smokeless tobacco: Never Used  . Alcohol use No     Comment: heavy in the past  . Drug use: No  . Sexual activity: No   Other Topics Concern  . Not on file   Social History Narrative   No living will   Son [name] should make decisions for her if she is unable.    Would accept resuscitation attempts but no prolonged ventilation   Not sure about tube feeds but probably wouldn't want them if cognitively unaware   Review of Systems  Lost weight when taking the sertraline Still gets reasonable pain relief with methadone    Objective:   Physical Exam  Constitutional: She appears well-developed. No distress.  Psychiatric:  Mood is brighter Normal affect Normal appearance and speech          Assessment & Plan:

## 2016-05-29 NOTE — Progress Notes (Signed)
Pre visit review using our clinic review tool, if applicable. No additional management support is needed unless otherwise documented below in the visit note.

## 2016-05-29 NOTE — Assessment & Plan Note (Signed)
Didn't tolerate the sertraline Still on mirtazapine Seems better now--will hold off on other Rx Will try bupropion if recurs

## 2016-06-04 DIAGNOSIS — E1122 Type 2 diabetes mellitus with diabetic chronic kidney disease: Secondary | ICD-10-CM | POA: Diagnosis not present

## 2016-06-04 DIAGNOSIS — I10 Essential (primary) hypertension: Secondary | ICD-10-CM | POA: Diagnosis not present

## 2016-06-04 DIAGNOSIS — N2581 Secondary hyperparathyroidism of renal origin: Secondary | ICD-10-CM | POA: Diagnosis not present

## 2016-06-04 DIAGNOSIS — N183 Chronic kidney disease, stage 3 (moderate): Secondary | ICD-10-CM | POA: Diagnosis not present

## 2016-06-04 DIAGNOSIS — E871 Hypo-osmolality and hyponatremia: Secondary | ICD-10-CM | POA: Diagnosis not present

## 2016-06-06 DIAGNOSIS — I509 Heart failure, unspecified: Secondary | ICD-10-CM | POA: Diagnosis not present

## 2016-06-06 DIAGNOSIS — J449 Chronic obstructive pulmonary disease, unspecified: Secondary | ICD-10-CM | POA: Diagnosis not present

## 2016-06-17 ENCOUNTER — Other Ambulatory Visit: Payer: Self-pay | Admitting: Internal Medicine

## 2016-07-02 ENCOUNTER — Other Ambulatory Visit: Payer: Self-pay

## 2016-07-02 MED ORDER — METHADONE HCL 10 MG PO TABS: 20.0000 mg | ORAL_TABLET | Freq: Four times a day (QID) | ORAL | 0 refills | Status: DC

## 2016-07-02 NOTE — Telephone Encounter (Signed)
Pt left /vm requesting refill methodone. Call when ready for pick up. Pt last seen and last printed # 240 on 05/29/16.

## 2016-07-02 NOTE — Telephone Encounter (Signed)
The 8/2 fill was for 1 week early. I will fill this but she shouldn't need more till over 30 days from now

## 2016-07-02 NOTE — Telephone Encounter (Signed)
Spoke to pt. She did not realize it was early. She forget she was given the rx at the office visit last time to save her a trip. She will wait to fill it and let us know what day she actually filled it when she asks for the refill next month

## 2016-07-03 ENCOUNTER — Telehealth: Payer: Self-pay

## 2016-07-03 NOTE — Telephone Encounter (Signed)
Changed enalap[facility] from 33m to 265[facility]

## 2016-07-07 DIAGNOSIS — I509 Heart failure, unspecified: Secondary | ICD-10-CM | POA: Diagnosis not present

## 2016-07-07 DIAGNOSIS — J449 Chronic obstructive pulmonary disease, unspecified: Secondary | ICD-10-CM | POA: Diagnosis not present

## 2016-07-23 ENCOUNTER — Telehealth: Payer: Self-pay | Admitting: Radiology

## 2016-07-23 ENCOUNTER — Encounter: Payer: Self-pay | Admitting: Internal Medicine

## 2016-07-23 ENCOUNTER — Ambulatory Visit (INDEPENDENT_AMBULATORY_CARE_PROVIDER_SITE_OTHER): Payer: PPO | Admitting: Internal Medicine

## 2016-07-23 VITALS — BP 118/64 | HR 84 | Temp 97.8°F | Wt 164.0 lb

## 2016-07-23 DIAGNOSIS — I208 Other forms of angina pectoris: Secondary | ICD-10-CM | POA: Diagnosis not present

## 2016-07-23 DIAGNOSIS — Z23 Encounter for immunization: Secondary | ICD-10-CM | POA: Diagnosis not present

## 2016-07-23 DIAGNOSIS — E1151 Type 2 diabetes mellitus with diabetic peripheral angiopathy without gangrene: Secondary | ICD-10-CM

## 2016-07-23 DIAGNOSIS — I5032 Chronic diastolic (congestive) heart failure: Secondary | ICD-10-CM

## 2016-07-23 DIAGNOSIS — I2089 Other forms of angina pectoris: Secondary | ICD-10-CM

## 2016-07-23 DIAGNOSIS — F330 Major depressive disorder, recurrent, mild: Secondary | ICD-10-CM

## 2016-07-23 DIAGNOSIS — IMO0002 Reserved for concepts with insufficient information to code with codable children: Secondary | ICD-10-CM

## 2016-07-23 DIAGNOSIS — E1165 Type 2 diabetes mellitus with hyperglycemia: Secondary | ICD-10-CM

## 2016-07-23 LAB — RENAL FUNCTION PANEL
ALBUMIN: 4 g/dL (ref 3.5–5.2)
BUN: 41 mg/dL — AB (ref 6–23)
CHLORIDE: 81 meq/L — AB (ref 96–112)
CO2: 35 meq/L — AB (ref 19–32)
Calcium: 9.3 mg/dL (ref 8.4–10.5)
Creatinine, Ser: 2.35 mg/dL — ABNORMAL HIGH (ref 0.40–1.20)
GFR: 22.23 mL/min — ABNORMAL LOW (ref >60.00)
Glucose, Bld: 133 mg/dL — ABNORMAL HIGH (ref 70–99)
Phosphorus: 4.6 mg/dL (ref 2.3–4.6)
Potassium: 5.2 mEq/L — ABNORMAL HIGH (ref 3.5–5.1)
SODIUM: 121 meq/L — AB (ref 135–145)

## 2016-07-23 MED ORDER — GABAPENTIN 300 MG PO CAPS: ORAL_CAPSULE | ORAL | 3 refills | Status: DC

## 2016-07-23 MED ORDER — NITROGLYCERIN 0.4 MG SL SUBL: 0.4000 mg | SUBLINGUAL_TABLET | SUBLINGUAL | 1 refills | Status: DC | PRN

## 2016-07-23 MED ORDER — GLIPIZIDE 5 MG PO TABS
5.0000 mg | ORAL_TABLET | Freq: Two times a day (BID) | ORAL | 3 refills | Status: DC
Start: 2016-07-23 — End: 2017-01-29

## 2016-07-23 NOTE — Assessment & Plan Note (Signed)
Sugars better now Goal around 8%

## 2016-07-23 NOTE — Assessment & Plan Note (Signed)
Seems now to be in remission Continue just the mirtazapine

## 2016-07-23 NOTE — Progress Notes (Signed)
Subjective:    Patient ID: Lindsay Harmon, female    DOB: 1956/06/30, 60 y.o.   MRN: [id number]  HPI Here for follow up of diabetes  She feels she is doing well Weighs daily Is up about 7-8# over the past month or so Not taking the metolazone much--had been taking it if over 150# but not recently (even over 150#) No change in breathing Sleeps in chair-- no PND Doesn't feel bloated like when fluid building up  Has had some angina pain lately Usually just sitting Uses the nitro--this helps (only one generally)  Mood seems okay Just on the mirtazapine "I feel like I am over the hump"  Sugars still running okay 108-160 generally No hypoglycemia  Current Outpatient Prescriptions on File Prior to Visit  Medication Sig Dispense Refill  . albuterol (PROVENTIL HFA;VENTOLIN HFA) 108 (90 Base) MCG/ACT inhaler Inhale 2 puffs into the lungs every 6 (six) hours as needed for wheezing or shortness of breath. 1 Inhaler 2  . ALPRAZolam (XANAX) 0.25 MG tablet Take 0.25 mg by mouth daily as needed.     Marland Kitchen aspirin 325 MG tablet Take 325 mg by mouth at bedtime.     . calcitRIOL (ROCALTROL) 0.25 MCG capsule Take 0.25 mcg by mouth daily.    . cholecalciferol (VITAMIN D) 1000 units tablet Take 1,000 Units by mouth daily.    . enalapril (VASOTEC) 20 MG tablet Take 20 mg by mouth daily.    Marland Kitchen gabapentin (NEURONTIN) 300 MG capsule Take 300-600 mg by mouth. Pt takes one capsule in the morning, one at noon, and two at bedtime.     Marland Kitchen glipiZIDE (GLUCOTROL) 5 MG tablet Take 1 tablet (5 mg total) by mouth 2 (two) times daily before a meal. 180 tablet 1  . Insulin Glargine (LANTUS SOLOSTAR) 100 UNIT/ML Solostar Pen Inject 10-30 Units into the skin daily. 15 mL 11  . loratadine (CLARITIN) 10 MG tablet Take 10 mg by mouth 2 (two) times daily.     Marland Kitchen lovastatin (MEVACOR) 40 MG tablet Take 80 mg by mouth at bedtime.    . methadone (DOLOPHINE) 10 MG tablet Take 2 tablets (20 mg total) by mouth 4 (four) times daily.  Fill in about 1 week 240 tablet 0  . metolazone (ZAROXOLYN) 2.5 MG tablet Take 2.5 mg by mouth daily as needed (when pts weight reaches 157 or greater).     . metoprolol tartrate (LOPRESSOR) 25 MG tablet Take 0.5 tablets (12.5 mg total) by mouth 2 (two) times daily. 90 tablet 3  . mirtazapine (REMERON) 30 MG tablet Take 15 mg by mouth at bedtime.    . nitroGLYCERIN (NITROSTAT) 0.4 MG SL tablet Place 0.4 mg under the tongue every 5 (five) minutes as needed for chest pain.     . ONE TOUCH ULTRA TEST test strip USE 1 STRIP TO TEST BLOOD SUGAR ONCE DAILY 100 each 4  . pantoprazole (PROTONIX) 40 MG tablet Take 1 tablet (40 mg total) by mouth 2 (two) times daily. 180 tablet 3  . potassium chloride SA (K-DUR,KLOR-CON) 20 MEQ tablet Take 20 mEq by mouth as needed (when taking Metolazone).     . Tiotropium Bromide-Olodaterol (STIOLTO RESPIMAT) 2.5-2.5 MCG/ACT AERS Inhale 2 puffs into the lungs daily. 4 g 5  . torsemide (DEMADEX) 20 MG tablet Take 20-40 mg by mouth 2 (two) times daily. Pt takes two tablets in the morning and one in the evening.     No current facility-administered medications on file prior  to visit.     Allergies  Allergen Reactions  . Sertraline Hcl Other (See Comments)    Malaise    Past Medical History:  Diagnosis Date  . Allergy   . Arthritis   . CAD (coronary artery disease)   . Depression   . Esophageal stricture   . Familial hematuria   . GERD (gastroesophageal reflux disease)   . Hyperlipidemia   . Hypertension   . IBS (irritable bowel syndrome)   . Nephrolithiasis   . NIDDM (non-insulin dependent diabetes mellitus)    with neuropathy  . Obesity, unspecified   . Pulmonary hypertension ([place])   . PVD (peripheral vascular disease) ([place])     Past Surgical History:  Procedure Laterality Date  . ABDOMINAL HYSTERECTOMY    . CARPAL TUNNEL RELEASE    . CESAREAN SECTION    . RIGHT HEART CATHETERIZATION N/A 12/05/2014   Procedure: RIGHT HEART CATH;  Surgeon: Sinclair Grooms, MD;  Location: [facility] CATH LAB;  Service: Cardiovascular;  Laterality: N/A;    Family History  Problem Relation Age of Onset  . Diabetes Mother   . Cancer Mother     ovarian,melanoma  . Emphysema Paternal Grandfather   . Allergies Sister   . Allergies Father   . Cancer Father     lung  . Cancer Maternal Grandmother     uterine  . Breast cancer Neg Hx     Social History   Social History  . Marital status: Legally Separated    Spouse name: N/A  . Number of children: 1  . Years of education: N/A   Occupational History  . 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 Smoking status: Former Smoker    Packs/day: 1.50    Years: 20.00    Types: Cigarettes    Quit date: 01/04/2009  . Smokeless tobacco: Never Used  . Alcohol use No     Comment: heavy in the past  . Drug use: No  . Sexual activity: No   Other Topics Concern  . Not on file   Social History Narrative   No living will   Son [name] should make decisions for her if she is unable.    Would accept resuscitation attempts but no prolonged ventilation   Not sure about tube feeds but probably wouldn't want them if cognitively unaware   Review of Systems Eating okay but doesn't think she has gained weight from food Sleeping okay     Objective:   Physical Exam  Constitutional: She appears well-developed and well-nourished. No distress.  Neck: No thyromegaly present.  Cardiovascular: Normal rate, regular rhythm and intact distal pulses.  Exam reveals no gallop.   Soft systolic murmur  Pulmonary/Chest: Effort normal and breath sounds normal. No respiratory distress. She has no wheezes. She has no rales.  Musculoskeletal: She exhibits no edema.  Lymphadenopathy:    She has no cervical adenopathy.  Psychiatric: She has a normal mood and affect. Her behavior is normal.          Assessment & Plan:

## 2016-07-23 NOTE — Assessment & Plan Note (Signed)
I suspect this is due to increased fluid Needs refill of nitro

## 2016-07-23 NOTE — Assessment & Plan Note (Signed)
Weight is up Will increase demadex and change parameters for metolazone

## 2016-07-23 NOTE — Patient Instructions (Signed)
Please increase the torsemide to 2 tabs twice a day. Take the metolazone any day your home weight is 155# or over.

## 2016-07-23 NOTE — Addendum Note (Signed)
Addended by: Pilar Grammes on: 07/23/2016 12:42 PM   Modules accepted: Orders

## 2016-07-23 NOTE — Progress Notes (Signed)
Pre visit review using our clinic review tool, if applicable. No additional management support is needed unless otherwise documented below in the visit note.

## 2016-07-23 NOTE — Telephone Encounter (Signed)
[facility] called critical sodium level - 121. Results called to Dr Silvio Pate

## 2016-07-24 ENCOUNTER — Other Ambulatory Visit
Admission: RE | Admit: 2016-07-24 | Discharge: 2016-07-24 | Disposition: A | Discharge status: Home / Self Care | Payer: PPO | Source: Ambulatory Visit | Attending: Nephrology | Admitting: Nephrology

## 2016-07-24 ENCOUNTER — Telehealth: Payer: Self-pay

## 2016-07-24 DIAGNOSIS — E871 Hypo-osmolality and hyponatremia: Secondary | ICD-10-CM | POA: Diagnosis not present

## 2016-07-24 DIAGNOSIS — N2581 Secondary hyperparathyroidism of renal origin: Secondary | ICD-10-CM | POA: Diagnosis not present

## 2016-07-24 DIAGNOSIS — N179 Acute kidney failure, unspecified: Secondary | ICD-10-CM | POA: Diagnosis not present

## 2016-07-24 DIAGNOSIS — N183 Chronic kidney disease, stage 3 (moderate): Secondary | ICD-10-CM | POA: Insufficient documentation

## 2016-07-24 DIAGNOSIS — I10 Essential (primary) hypertension: Secondary | ICD-10-CM | POA: Diagnosis not present

## 2016-07-24 LAB — COMPREHENSIVE METABOLIC PANEL
ALK PHOS: 75 U/L (ref 38–126)
ALT: 15 U/L (ref 14–54)
ANION GAP: 11 (ref 5–15)
AST: 24 U/L (ref 15–41)
Albumin: 3.9 g/dL (ref 3.5–5.0)
BILIRUBIN TOTAL: 0.2 mg/dL — AB (ref 0.3–1.2)
BUN: 43 mg/dL — ABNORMAL HIGH (ref 6–20)
CALCIUM: 9 mg/dL (ref 8.9–10.3)
CO2: 29 mmol/L (ref 22–32)
Chloride: 80 mmol/L — ABNORMAL LOW (ref 101–111)
Creatinine, Ser: 2.39 mg/dL — ABNORMAL HIGH (ref 0.44–1.00)
GFR calc non Af Amer: 21 mL/min — ABNORMAL LOW (ref >60)
GFR, EST AFRICAN AMERICAN: 24 mL/min — AB (ref >60)
Glucose, Bld: 154 mg/dL — ABNORMAL HIGH (ref 65–99)
Potassium: 5.3 mmol/L — ABNORMAL HIGH (ref 3.5–5.1)
SODIUM: 120 mmol/L — AB (ref 135–145)
TOTAL PROTEIN: 7.4 g/dL (ref 6.5–8.1)

## 2016-07-24 MED ORDER — TORSEMIDE 20 MG PO TABS: 20.0000 mg | ORAL_TABLET | Freq: Two times a day (BID) | ORAL | 0 refills | Status: DC

## 2016-07-24 NOTE — Telephone Encounter (Signed)
[facility] left v/m wanting to verify gabapentin instructions were supposed to be changed to one in AM, one at noon and 2 at hs. See change on med list but did not see noted in 07/23/16 office note. Also wanted to know if could use [facility] brand of nitroglycerin. Please advise.

## 2016-07-24 NOTE — Telephone Encounter (Signed)
Left detailed message for Lindsay Harmon at [facility] how she takes her gabapentin and authorized their [employer]

## 2016-07-24 NOTE — Telephone Encounter (Signed)
Pt confirmed with me yesterday at the visit that she takes her gabapentin 1 in the am, 1 in afternoon, 2 at bedtime.

## 2016-07-24 NOTE — Telephone Encounter (Signed)
PC with Dr Holley Raring He feels she is intravascularly dry--so will hold torsemide for 2 days then restart at 67m bid He will see her back next week to follow up

## 2016-07-24 NOTE — Telephone Encounter (Signed)
Reviewed with Dr Holley Raring Weight up but seems dry on labs They will see her this afternoon

## 2016-07-24 NOTE — Telephone Encounter (Signed)
I think that is her gabapentin dose--confirm with her before calling [facility] back Okay to use their nitro

## 2016-07-26 DIAGNOSIS — N183 Chronic kidney disease, stage 3 (moderate): Secondary | ICD-10-CM | POA: Diagnosis not present

## 2016-07-26 DIAGNOSIS — E1122 Type 2 diabetes mellitus with diabetic chronic kidney disease: Secondary | ICD-10-CM | POA: Diagnosis not present

## 2016-07-26 DIAGNOSIS — I10 Essential (primary) hypertension: Secondary | ICD-10-CM | POA: Diagnosis not present

## 2016-07-26 DIAGNOSIS — E871 Hypo-osmolality and hyponatremia: Secondary | ICD-10-CM | POA: Diagnosis not present

## 2016-07-29 ENCOUNTER — Other Ambulatory Visit: Payer: Self-pay

## 2016-07-29 MED ORDER — METHADONE HCL 10 MG PO TABS: 20.0000 mg | ORAL_TABLET | Freq: Four times a day (QID) | ORAL | 0 refills | Status: DC

## 2016-07-29 NOTE — Telephone Encounter (Signed)
LAST FILLED ON 07/02/16 #240, LAST OV 07/24/16. OK TO REFILL?

## 2016-07-29 NOTE — Telephone Encounter (Signed)
Spoke to pt. Rx up front for pickup

## 2016-07-30 ENCOUNTER — Ambulatory Visit
Admission: RE | Admit: 2016-07-30 | Discharge: 2016-07-30 | Disposition: A | Discharge status: Home / Self Care | Payer: PPO | Source: Ambulatory Visit | Attending: Nephrology | Admitting: Nephrology

## 2016-07-30 ENCOUNTER — Other Ambulatory Visit: Payer: Self-pay | Admitting: Nephrology

## 2016-07-30 DIAGNOSIS — R0602 Shortness of breath: Secondary | ICD-10-CM | POA: Diagnosis not present

## 2016-07-30 DIAGNOSIS — I10 Essential (primary) hypertension: Secondary | ICD-10-CM | POA: Diagnosis not present

## 2016-07-30 DIAGNOSIS — J449 Chronic obstructive pulmonary disease, unspecified: Secondary | ICD-10-CM | POA: Diagnosis not present

## 2016-07-30 DIAGNOSIS — E871 Hypo-osmolality and hyponatremia: Secondary | ICD-10-CM | POA: Diagnosis not present

## 2016-07-30 DIAGNOSIS — N183 Chronic kidney disease, stage 3 (moderate): Secondary | ICD-10-CM | POA: Diagnosis not present

## 2016-07-31 DIAGNOSIS — E871 Hypo-osmolality and hyponatremia: Secondary | ICD-10-CM | POA: Diagnosis not present

## 2016-07-31 DIAGNOSIS — I10 Essential (primary) hypertension: Secondary | ICD-10-CM | POA: Diagnosis not present

## 2016-07-31 DIAGNOSIS — N183 Chronic kidney disease, stage 3 (moderate): Secondary | ICD-10-CM | POA: Diagnosis not present

## 2016-07-31 DIAGNOSIS — E1122 Type 2 diabetes mellitus with diabetic chronic kidney disease: Secondary | ICD-10-CM | POA: Diagnosis not present

## 2016-08-01 ENCOUNTER — Ambulatory Visit
Admission: RE | Admit: 2016-08-01 | Discharge: 2016-08-01 | Disposition: A | Discharge status: Home / Self Care | Payer: PPO | Source: Ambulatory Visit | Attending: Nephrology | Admitting: Nephrology

## 2016-08-01 DIAGNOSIS — N183 Chronic kidney disease, stage 3 (moderate): Secondary | ICD-10-CM | POA: Diagnosis not present

## 2016-08-01 DIAGNOSIS — E871 Hypo-osmolality and hyponatremia: Secondary | ICD-10-CM | POA: Insufficient documentation

## 2016-08-01 IMAGING — US US ABDOMEN COMPLETE
1 series · 13 of 25 positions shown · non-contrast
Comparison: Renal ultrasound from 12/03/2014.

CLINICAL DATA: Initial encounter for hepatic insufficiency.

EXAM:
ULTRASOUND ABDOMEN COMPLETE

[Series 1: us abdomen complete · 0.21mm/px · 13 of 103 slices shown]
[im 1/103]
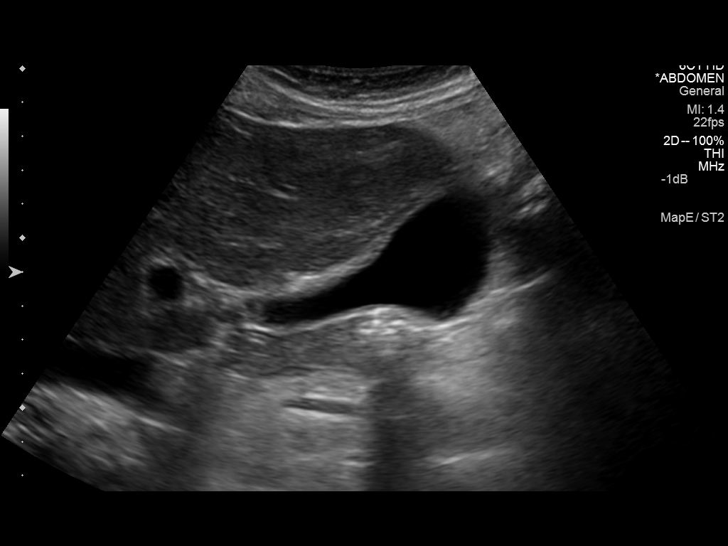
[im 9/103]
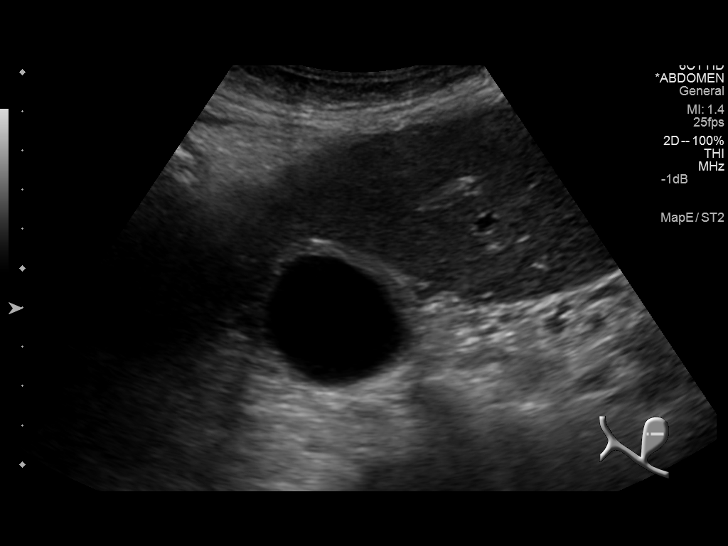
[im 18/103]
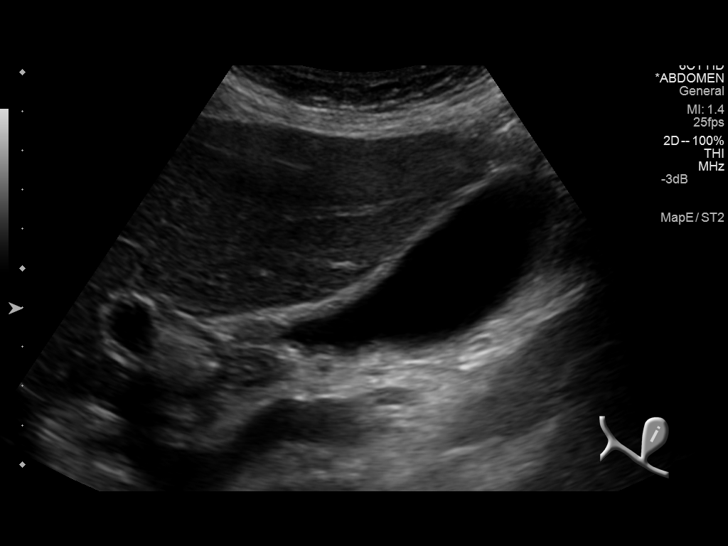
[im 26/103]
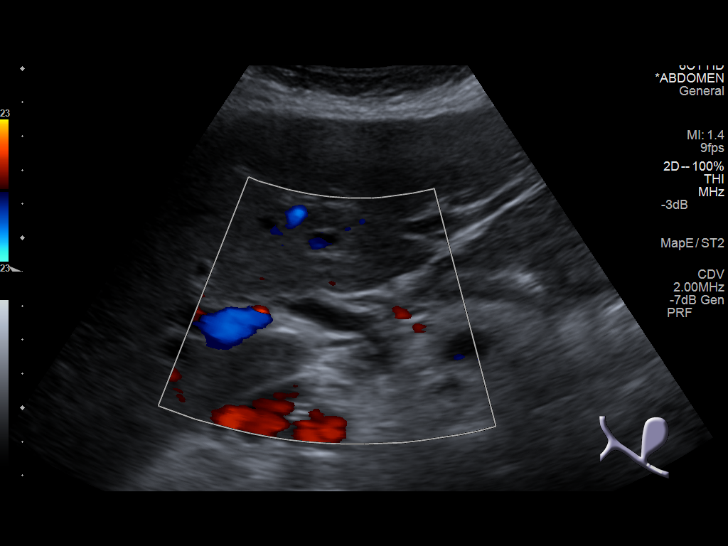
[im 35/103]
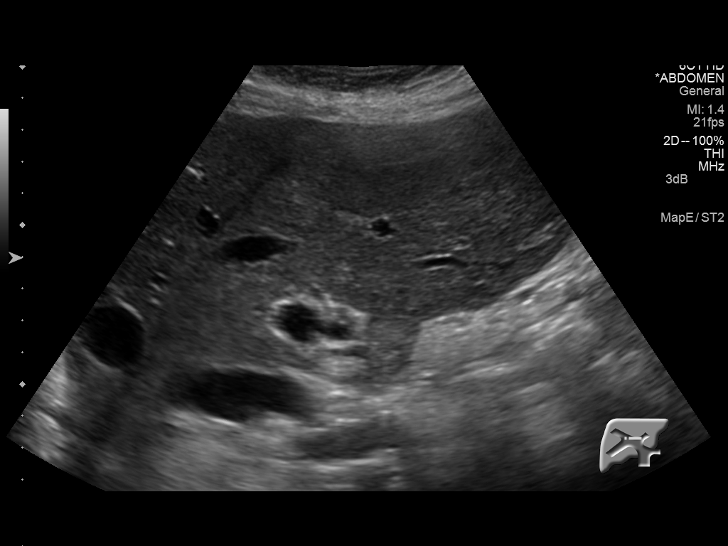
[im 43/103]
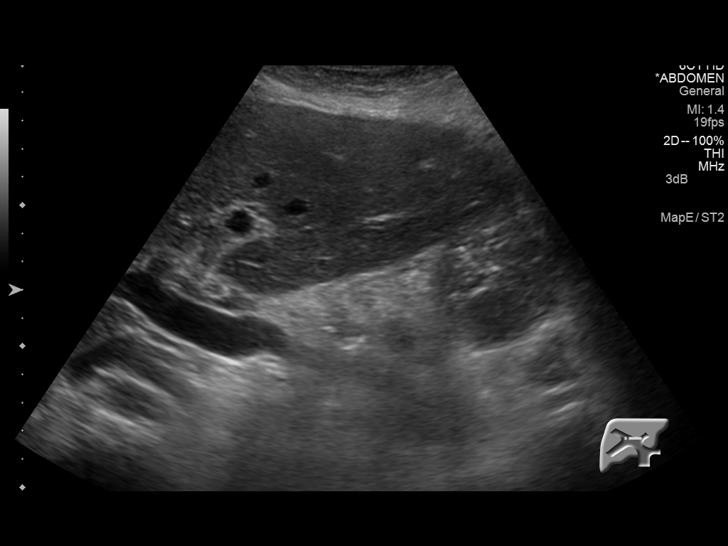
[im 52/103]
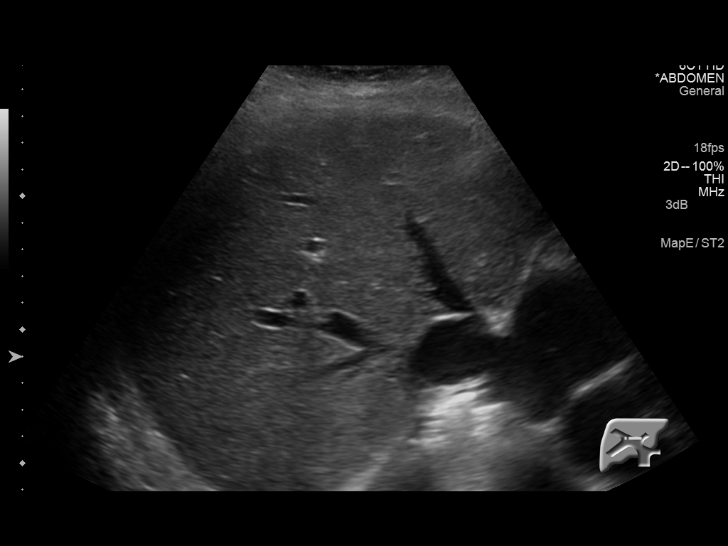
[im 60/103]
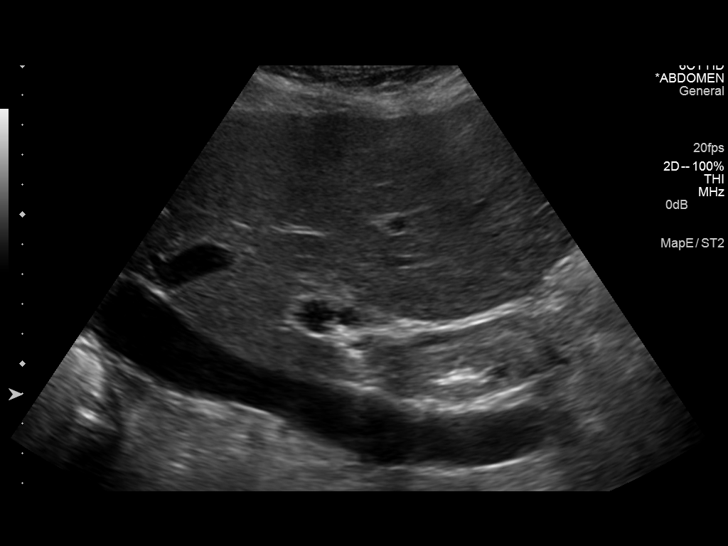
[im 69/103]
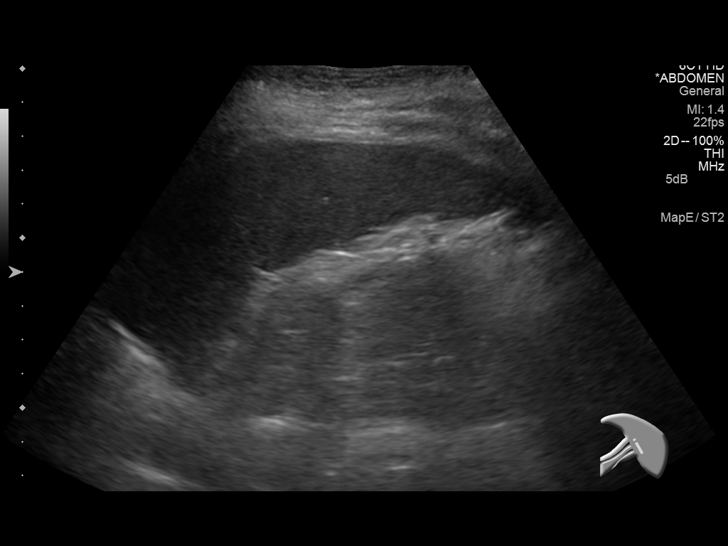
[im 77/103]
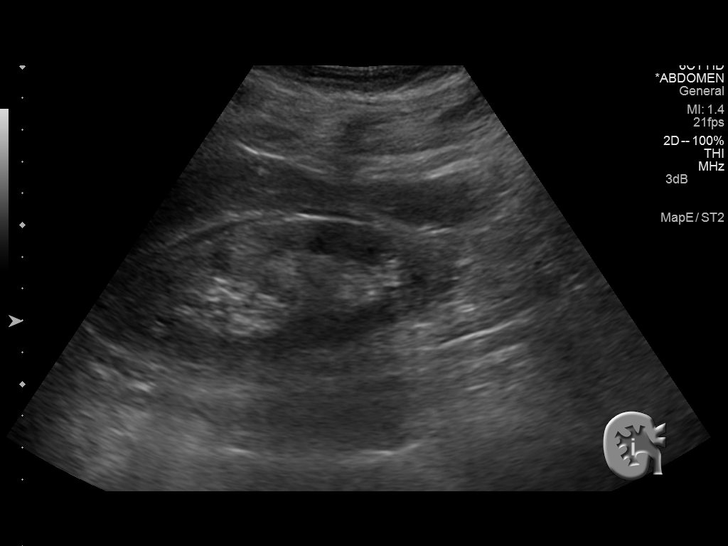
[im 86/103]
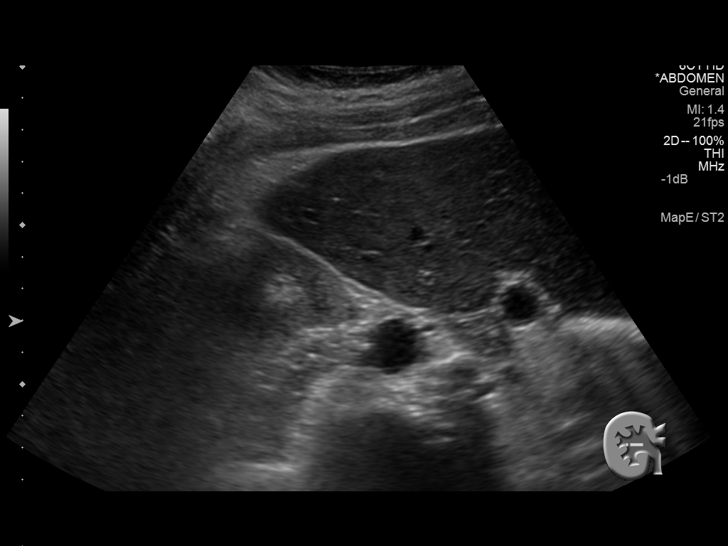
[im 94/103]
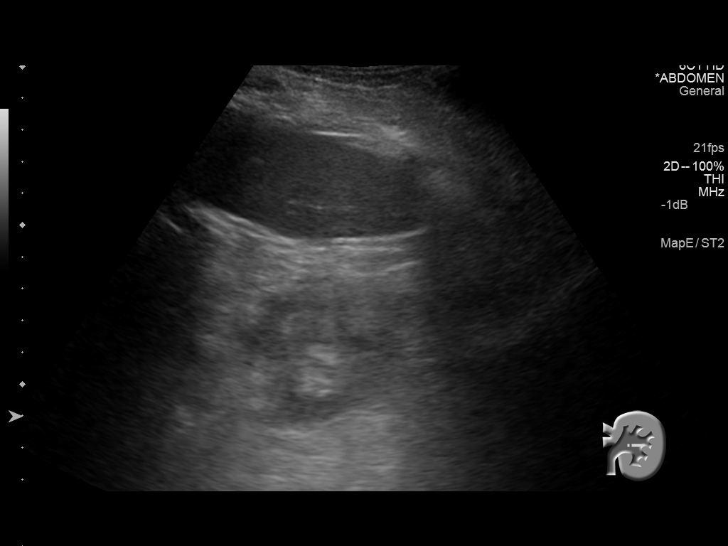
[im 103/103]
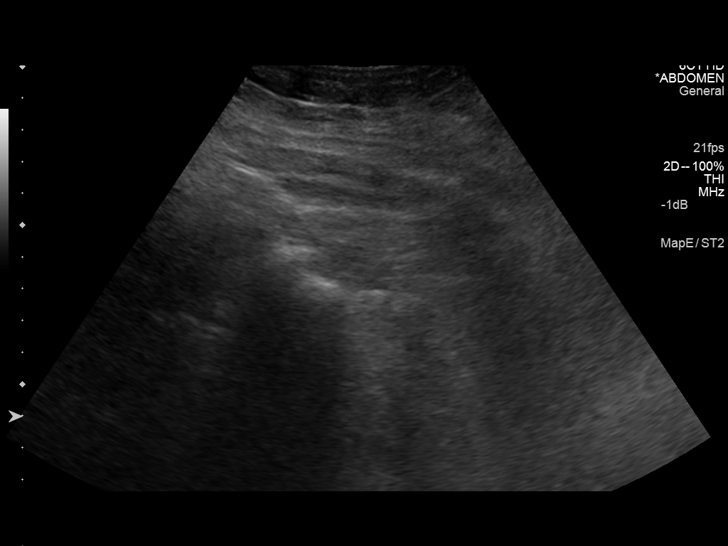

[13 of 25 positions shown; findings below may reference images not displayed]

FINDINGS: Gallbladder: No evidence for gallstones. Gallbladder wall is mildly
thickened at 3-4 mm. No pericholecystic fluid. The sonographer
reports no sonographic Murphy sign.

Common bile duct: Diameter: Dilated 8 mm diameter.

Liver: No substantial intrahepatic biliary duct dilatation. No focal
intrahepatic mass lesion. Liver contour appears slightly nodular.
Left hepatic lobe is prominent.

IVC: No abnormality visualized.

Pancreas: Visualized portion unremarkable.

Spleen: Size and appearance within normal limits.

Right Kidney: Length: 11.8 cm. Renal parenchyma shows diffusely
increased echogenicity. No hydronephrosis.

Left Kidney: Length: 11.0 cm. Increased echogenicity. No
hydronephrosis.

Abdominal aorta: Atherosclerotic wall irregularity is evident.

Other findings: Small bilateral pleural effusions are evident. There
is a small amount of fluid around the liver and in the right
paracolic gutter.
IMPRESSION: Subtle nodularity of the liver contour raises the question of
cirrhosis. No intrahepatic focal mass lesion.

Borderline gallbladder wall thickening without gallstones or
pericholecystic fluid. Extrahepatic common duct is mildly distended
although no associated intrahepatic biliary duct dilatation is
evident. Gallbladder wall thickening may be secondary to
hypoproteinemia or systemic disease. If there is clinical concern
for cholecystitis, nuclear scintigraphy may prove helpful to further
evaluate.

Small pleural effusions with small volume ascites.

Echogenic kidneys compatible with medical renal disease.

Atherosclerosis.

## 2016-08-01 MED ORDER — FUROSEMIDE 10 MG/ML IJ SOLN
INTRAMUSCULAR | Status: AC
  Administered 2016-08-01: 40 mg via INTRAVENOUS
  Filled 2016-08-01: qty 4

## 2016-08-01 MED ORDER — SODIUM CHLORIDE FLUSH 0.9 % IV SOLN
INTRAVENOUS | Status: AC
  Filled 2016-08-01: qty 20

## 2016-08-01 MED ORDER — FUROSEMIDE 10 MG/ML IJ SOLN
40.0000 mg | Freq: Once | INTRAMUSCULAR | Status: AC
  Administered 2016-08-01: 40 mg via INTRAVENOUS

## 2016-08-02 ENCOUNTER — Ambulatory Visit
Admission: RE | Admit: 2016-08-02 | Discharge: 2016-08-02 | Disposition: A | Discharge status: Home / Self Care | Payer: PPO | Source: Ambulatory Visit | Attending: Nephrology | Admitting: Nephrology

## 2016-08-02 DIAGNOSIS — N183 Chronic kidney disease, stage 3 (moderate): Secondary | ICD-10-CM | POA: Insufficient documentation

## 2016-08-02 DIAGNOSIS — I10 Essential (primary) hypertension: Secondary | ICD-10-CM | POA: Diagnosis not present

## 2016-08-02 DIAGNOSIS — E1122 Type 2 diabetes mellitus with diabetic chronic kidney disease: Secondary | ICD-10-CM | POA: Diagnosis not present

## 2016-08-02 DIAGNOSIS — E871 Hypo-osmolality and hyponatremia: Secondary | ICD-10-CM | POA: Insufficient documentation

## 2016-08-02 MED ORDER — SODIUM CHLORIDE 0.9% FLUSH
10.0000 mL | Freq: Once | INTRAVENOUS | Status: AC
  Administered 2016-08-02: 10 mL via INTRAVENOUS

## 2016-08-02 MED ORDER — FUROSEMIDE 10 MG/ML IJ SOLN
INTRAMUSCULAR | Status: AC
  Filled 2016-08-02: qty 4

## 2016-08-02 MED ORDER — SODIUM CHLORIDE FLUSH 0.9 % IV SOLN
INTRAVENOUS | Status: AC
  Filled 2016-08-02: qty 20

## 2016-08-02 MED ORDER — FUROSEMIDE 10 MG/ML IJ SOLN
40.0000 mg | Freq: Once | INTRAMUSCULAR | Status: AC
  Administered 2016-08-02: 40 mg via INTRAVENOUS

## 2016-08-06 DIAGNOSIS — J449 Chronic obstructive pulmonary disease, unspecified: Secondary | ICD-10-CM | POA: Diagnosis not present

## 2016-08-06 DIAGNOSIS — I509 Heart failure, unspecified: Secondary | ICD-10-CM | POA: Diagnosis not present

## 2016-08-07 DIAGNOSIS — N183 Chronic kidney disease, stage 3 (moderate): Secondary | ICD-10-CM | POA: Diagnosis not present

## 2016-08-07 DIAGNOSIS — E875 Hyperkalemia: Secondary | ICD-10-CM | POA: Diagnosis not present

## 2016-08-07 DIAGNOSIS — E871 Hypo-osmolality and hyponatremia: Secondary | ICD-10-CM | POA: Diagnosis not present

## 2016-08-23 DIAGNOSIS — I10 Essential (primary) hypertension: Secondary | ICD-10-CM | POA: Diagnosis not present

## 2016-08-23 DIAGNOSIS — E875 Hyperkalemia: Secondary | ICD-10-CM | POA: Diagnosis not present

## 2016-08-23 DIAGNOSIS — N184 Chronic kidney disease, stage 4 (severe): Secondary | ICD-10-CM | POA: Diagnosis not present

## 2016-08-23 DIAGNOSIS — E871 Hypo-osmolality and hyponatremia: Secondary | ICD-10-CM | POA: Diagnosis not present

## 2016-08-28 DIAGNOSIS — E119 Type 2 diabetes mellitus without complications: Secondary | ICD-10-CM | POA: Diagnosis not present

## 2016-08-28 LAB — HM DIABETES EYE EXAM

## 2016-08-30 DIAGNOSIS — E1122 Type 2 diabetes mellitus with diabetic chronic kidney disease: Secondary | ICD-10-CM | POA: Diagnosis not present

## 2016-08-30 DIAGNOSIS — N183 Chronic kidney disease, stage 3 (moderate): Secondary | ICD-10-CM | POA: Diagnosis not present

## 2016-08-30 DIAGNOSIS — E871 Hypo-osmolality and hyponatremia: Secondary | ICD-10-CM | POA: Diagnosis not present

## 2016-09-06 DIAGNOSIS — I509 Heart failure, unspecified: Secondary | ICD-10-CM | POA: Diagnosis not present

## 2016-09-06 DIAGNOSIS — J449 Chronic obstructive pulmonary disease, unspecified: Secondary | ICD-10-CM | POA: Diagnosis not present

## 2016-09-09 ENCOUNTER — Encounter: Payer: Self-pay | Admitting: Pulmonary Disease

## 2016-09-09 ENCOUNTER — Ambulatory Visit (INDEPENDENT_AMBULATORY_CARE_PROVIDER_SITE_OTHER): Payer: PPO | Admitting: Pulmonary Disease

## 2016-09-09 DIAGNOSIS — I2781 Cor pulmonale (chronic): Secondary | ICD-10-CM | POA: Diagnosis not present

## 2016-09-09 DIAGNOSIS — J411 Mucopurulent chronic bronchitis: Secondary | ICD-10-CM | POA: Diagnosis not present

## 2016-09-09 DIAGNOSIS — I10 Essential (primary) hypertension: Secondary | ICD-10-CM | POA: Diagnosis not present

## 2016-09-09 DIAGNOSIS — J9611 Chronic respiratory failure with hypoxia: Secondary | ICD-10-CM | POA: Diagnosis not present

## 2016-09-09 NOTE — Assessment & Plan Note (Signed)
She has medically complex disease with severe chronic obstructive pulmonary disease but, this is been a stable interval for her. She has not had an exacerbation largely in art to the fact that she's quite good with her hand hygiene. She is also very compliant with her medications.  Her flu shot and pneumonia vaccines are up-to-date.  Plan: Continue Stiolto Continue as needed albuterol Follow-up 6 months

## 2016-09-09 NOTE — Assessment & Plan Note (Signed)
Her blood pressure has been up a bit lately. She will talk to her nephrologist about this as he recently started her on Fludricortisone

## 2016-09-09 NOTE — Assessment & Plan Note (Signed)
She continues to use and benefit from her oxygen. She'll continue using 2 L at rest and 3 L with exertion and daily at bedtime.

## 2016-09-09 NOTE — Progress Notes (Signed)
Subjective:    Patient ID: Lindsay Harmon, female    DOB: Feb 27, 1956, 60 y.o.   MRN: [id number]   Synopsis: Referred by cardiology in 2016 for evaluation of severe COPD and possible obstructive sleep apnea in the setting of pulmonary hypertension. She also has congestive heart failure. A sleep study in 2016 showed no evidence of obstructive sleep apnea but she does have significant nocturnal hypoxemia. Spirometry testing in 2016 confirmed a diagnosis of COPD with severe airflow obstruction FEV1 41% pred.  HPI Chief Complaint  Patient presents with  . Follow-up    pt doing well, no complaints today.     Lindsay Harmon has been doing OK.  She hasn't had any flare up of her COPD.  She has had a little wheezing form time to time and uses albuterol which helps (only twice since the last visit).  She still takes Stiolto every morning.  She continues to use her oxygen at 2 L rest and 3L with exertion.  She says the oxygen helps.  She is taking some blood pressure medicines.    She had a flu shot this year.  She had a pneumonia vaccine as well.    Past Medical History:  Diagnosis Date  . Allergy   . Arthritis   . CAD (coronary artery disease)   . Depression   . Esophageal stricture   . Familial hematuria   . GERD (gastroesophageal reflux disease)   . Hyperlipidemia   . Hypertension   . IBS (irritable bowel syndrome)   . Nephrolithiasis   . NIDDM (non-insulin dependent diabetes mellitus)    with neuropathy  . Obesity, unspecified   . Pulmonary hypertension   . PVD (peripheral vascular disease) ([place])       Review of Systems  Constitutional: Positive for fatigue. Negative for diaphoresis and fever.  HENT: Negative for postnasal drip, rhinorrhea and sinus pressure.   Respiratory: Negative for cough, shortness of breath and wheezing.   Cardiovascular: Negative for chest pain, palpitations and leg swelling.       Objective:   Physical Exam  Vitals:   09/09/16 1120  BP: (!) 144/82    Pulse: 96  SpO2: 92%  Weight: 167 lb (75.8 kg)  Height: _0  (1.549 m)  2L [place]  Gen: chronically ill appearing HENT: OP clear, TM's clear, neck supple PULM: few crackles bases, normal effort  CV: RRR, no mgr, trace edema GI: BS+, soft, nontender Derm: no cyanosis or rash Psyche: normal mood and affect  CBC    Component Value Date/Time   WBC 7.1 01/17/2016 1816   RBC 4.12 01/17/2016 1816   HGB 11.3 (L) 01/17/2016 1816   HCT 33.4 (L) 01/17/2016 1816   PLT 213 01/17/2016 1816   MCV 81.2 01/17/2016 1816   MCH 27.5 01/17/2016 1816   MCHC 33.8 01/17/2016 1816   RDW 16.3 (H) 01/17/2016 1816   LYMPHSABS 1.4 01/17/2016 1816   MONOABS 0.5 01/17/2016 1816   EOSABS 0.3 01/17/2016 1816   BASOSABS 0.0 01/17/2016 1816   07/2016 CXR > "COPD", no other acute abnormality     Assessment & Plan:   COPD (chronic obstructive pulmonary disease) ([place]) She has medically complex disease with severe chronic obstructive pulmonary disease but, this is been a stable interval for her. She has not had an exacerbation largely in art to the fact that she's quite good with her hand hygiene. She is also very compliant with her medications.  Her flu shot and pneumonia vaccines are up-to-date.  Plan: Continue Stiolto Continue as needed albuterol Follow-up 6 months  Chronic respiratory failure with hypoxia ([place]) She continues to use and benefit from her oxygen. She'll continue using 2 L at rest and 3 L with exertion and daily at bedtime.  Cor pulmonale, chronic (HCC) Blood pressure stable, weight stable. Continue current regimen per  Essential hypertension, benign Her blood pressure has been up a bit lately. She will talk to her nephrologist about this as he recently started her on Fludricortisone   Updated Medication List Outpatient Encounter Prescriptions as of 09/09/2016  Medication Sig  . albuterol (PROVENTIL HFA;VENTOLIN HFA) 108 (90 Base) MCG/ACT inhaler Inhale 2 puffs into the lungs  every 6 (six) hours as needed for wheezing or shortness of breath.  . ALPRAZolam (XANAX) 0.25 MG tablet Take 0.25 mg by mouth daily as needed.   Marland Kitchen aspirin 325 MG tablet Take 325 mg by mouth at bedtime.   . calcitRIOL (ROCALTROL) 0.25 MCG capsule Take 0.25 mcg by mouth daily.  . cholecalciferol (VITAMIN D) 1000 units tablet Take 1,000 Units by mouth daily.  . enalapril (VASOTEC) 20 MG tablet Take 20 mg by mouth daily.  Marland Kitchen gabapentin (NEURONTIN) 300 MG capsule Pt takes one capsule in the morning, one at noon, and two at bedtime.  Marland Kitchen glipiZIDE (GLUCOTROL) 5 MG tablet Take 1 tablet (5 mg total) by mouth 2 (two) times daily before a meal.  . Insulin Glargine (LANTUS SOLOSTAR) 100 UNIT/ML Solostar Pen Inject 10-30 Units into the skin daily.  Marland Kitchen loratadine (CLARITIN) 10 MG tablet Take 10 mg by mouth 2 (two) times daily.   Marland Kitchen lovastatin (MEVACOR) 40 MG tablet Take 80 mg by mouth at bedtime.  . methadone (DOLOPHINE) 10 MG tablet Take 2 tablets (20 mg total) by mouth 4 (four) times daily. Fill in about 1 week  . metolazone (ZAROXOLYN) 2.5 MG tablet Take 2.5 mg by mouth daily as needed (when pts weight reaches 157 or greater).   . metoprolol tartrate (LOPRESSOR) 25 MG tablet Take 0.5 tablets (12.5 mg total) by mouth 2 (two) times daily.  . mirtazapine (REMERON) 30 MG tablet Take 15 mg by mouth at bedtime.  . nitroGLYCERIN (NITROSTAT) 0.4 MG SL tablet Place 1 tablet (0.4 mg total) under the tongue every 5 (five) minutes as needed for chest pain.  . ONE TOUCH ULTRA TEST test strip USE 1 STRIP TO TEST BLOOD SUGAR ONCE DAILY  . pantoprazole (PROTONIX) 40 MG tablet Take 1 tablet (40 mg total) by mouth 2 (two) times daily.  . potassium chloride SA (K-DUR,KLOR-CON) 20 MEQ tablet Take 20 mEq by mouth as needed (when taking Metolazone).   . Tiotropium Bromide-Olodaterol (STIOLTO RESPIMAT) 2.5-2.5 MCG/ACT AERS Inhale 2 puffs into the lungs daily.  Marland Kitchen torsemide (DEMADEX) 20 MG tablet Take 1 tablet (20 mg total) by mouth 2  (two) times daily.   No facility-administered encounter medications on file as of 09/09/2016.

## 2016-09-09 NOTE — Patient Instructions (Signed)
Keep taking your Stiolto as you're doing Keep using your oxygen as you're doing We will see you back in 6 months or sooner if

## 2016-09-09 NOTE — Assessment & Plan Note (Signed)
Blood pressure stable, weight stable. Continue current regimen per

## 2016-09-10 ENCOUNTER — Encounter: Payer: Self-pay | Admitting: Internal Medicine

## 2016-09-10 ENCOUNTER — Other Ambulatory Visit: Payer: Self-pay

## 2016-09-10 MED ORDER — METHADONE HCL 10 MG PO TABS: 20.0000 mg | ORAL_TABLET | Freq: Four times a day (QID) | ORAL | 0 refills | Status: DC

## 2016-09-10 NOTE — Telephone Encounter (Signed)
Spoke to pt. Rx up front ready for pickup

## 2016-09-10 NOTE — Telephone Encounter (Signed)
Pt left v/m requesting rx methadone. Call when ready for pick up. Last printed # 240 on 07/29/16. Last seen 07/23/16.

## 2016-09-11 DIAGNOSIS — E871 Hypo-osmolality and hyponatremia: Secondary | ICD-10-CM | POA: Diagnosis not present

## 2016-09-11 DIAGNOSIS — N183 Chronic kidney disease, stage 3 (moderate): Secondary | ICD-10-CM | POA: Diagnosis not present

## 2016-09-11 DIAGNOSIS — I10 Essential (primary) hypertension: Secondary | ICD-10-CM | POA: Diagnosis not present

## 2016-09-11 DIAGNOSIS — E1122 Type 2 diabetes mellitus with diabetic chronic kidney disease: Secondary | ICD-10-CM | POA: Diagnosis not present

## 2016-09-26 ENCOUNTER — Other Ambulatory Visit: Payer: Self-pay

## 2016-09-26 MED ORDER — TIOTROPIUM BROMIDE-OLODATEROL 2.5-2.5 MCG/ACT IN AERS: 2.0000 | INHALATION_SPRAY | Freq: Every day | RESPIRATORY_TRACT | 5 refills | Status: DC

## 2016-10-04 DIAGNOSIS — I10 Essential (primary) hypertension: Secondary | ICD-10-CM | POA: Diagnosis not present

## 2016-10-04 DIAGNOSIS — N183 Chronic kidney disease, stage 3 (moderate): Secondary | ICD-10-CM | POA: Diagnosis not present

## 2016-10-04 DIAGNOSIS — E871 Hypo-osmolality and hyponatremia: Secondary | ICD-10-CM | POA: Diagnosis not present

## 2016-10-04 DIAGNOSIS — E875 Hyperkalemia: Secondary | ICD-10-CM | POA: Diagnosis not present

## 2016-10-06 DIAGNOSIS — J449 Chronic obstructive pulmonary disease, unspecified: Secondary | ICD-10-CM | POA: Diagnosis not present

## 2016-10-06 DIAGNOSIS — I509 Heart failure, unspecified: Secondary | ICD-10-CM | POA: Diagnosis not present

## 2016-10-14 ENCOUNTER — Other Ambulatory Visit: Payer: Self-pay

## 2016-10-14 MED ORDER — METHADONE HCL 10 MG PO TABS: 20.0000 mg | ORAL_TABLET | Freq: Four times a day (QID) | ORAL | 0 refills | Status: DC

## 2016-10-14 NOTE — Telephone Encounter (Signed)
Pt left v/m requesting rx methadone. Call when ready for pick up. Rx last printed # 240 on 09/10/16 and last seen 07/23/16.

## 2016-10-14 NOTE — Telephone Encounter (Signed)
Spoek to pt. Rx up front ready for pickup

## 2016-10-23 ENCOUNTER — Encounter: Payer: Self-pay | Admitting: Internal Medicine

## 2016-10-23 ENCOUNTER — Ambulatory Visit (INDEPENDENT_AMBULATORY_CARE_PROVIDER_SITE_OTHER): Payer: PPO | Admitting: Internal Medicine

## 2016-10-23 VITALS — BP 116/60 | HR 86 | Temp 98.1°F | Wt 167.0 lb

## 2016-10-23 DIAGNOSIS — J449 Chronic obstructive pulmonary disease, unspecified: Secondary | ICD-10-CM | POA: Diagnosis not present

## 2016-10-23 DIAGNOSIS — E1165 Type 2 diabetes mellitus with hyperglycemia: Secondary | ICD-10-CM

## 2016-10-23 DIAGNOSIS — M5440 Lumbago with sciatica, unspecified side: Secondary | ICD-10-CM

## 2016-10-23 DIAGNOSIS — IMO0002 Reserved for concepts with insufficient information to code with codable children: Secondary | ICD-10-CM

## 2016-10-23 DIAGNOSIS — E871 Hypo-osmolality and hyponatremia: Secondary | ICD-10-CM

## 2016-10-23 DIAGNOSIS — F330 Major depressive disorder, recurrent, mild: Secondary | ICD-10-CM | POA: Diagnosis not present

## 2016-10-23 DIAGNOSIS — I5032 Chronic diastolic (congestive) heart failure: Secondary | ICD-10-CM

## 2016-10-23 DIAGNOSIS — G8929 Other chronic pain: Secondary | ICD-10-CM

## 2016-10-23 DIAGNOSIS — E1151 Type 2 diabetes mellitus with diabetic peripheral angiopathy without gangrene: Secondary | ICD-10-CM | POA: Diagnosis not present

## 2016-10-23 LAB — LIPID PANEL
CHOL/HDL RATIO: 3
Cholesterol: 130 mg/dL (ref 0–200)
HDL: 43.3 mg/dL (ref >39.00)
NONHDL: 86.93
TRIGLYCERIDES: 214 mg/dL — AB (ref 0.0–149.0)
VLDL: 42.8 mg/dL — AB (ref 0.0–40.0)

## 2016-10-23 LAB — HEMOGLOBIN A1C: Hgb A1c MFr Bld: 7.9 % — ABNORMAL HIGH (ref 4.6–6.5)

## 2016-10-23 LAB — LDL CHOLESTEROL, DIRECT: Direct LDL: 65 mg/dL

## 2016-10-23 NOTE — Assessment & Plan Note (Signed)
Better on the fludrocortisone Weight and BP okay on this

## 2016-10-23 NOTE — Progress Notes (Signed)
Subjective:    Patient ID: Lindsay Harmon, female    DOB: October 26, 1956, 60 y.o.   MRN: [id number]  HPI Here for follow up of diabetes and other chronic health conditions  Reviewed Dr Elwyn Lade recent note Sodium now normalized with the fludrocortisone Has been checking weights daily---has remained stable BP has been up a little at home--but generally okay still Breathing has been good No chest pain No dizziness or syncope  Has been checking sugars regularly 90's to 120 fasting No hypoglycemic reactions No pain in feet  Depression has quieted down Mood is better  Current Outpatient Prescriptions on File Prior to Visit  Medication Sig Dispense Refill  . albuterol (PROVENTIL HFA;VENTOLIN HFA) 108 (90 Base) MCG/ACT inhaler Inhale 2 puffs into the lungs every 6 (six) hours as needed for wheezing or shortness of breath. 1 Inhaler 2  . ALPRAZolam (XANAX) 0.25 MG tablet Take 0.25 mg by mouth daily as needed.     Marland Kitchen aspirin 325 MG tablet Take 325 mg by mouth at bedtime.     . calcitRIOL (ROCALTROL) 0.25 MCG capsule Take 0.25 mcg by mouth daily.    . cholecalciferol (VITAMIN D) 1000 units tablet Take 1,000 Units by mouth daily.    . enalapril (VASOTEC) 20 MG tablet Take 20 mg by mouth daily.    . fludrocortisone (FLORINEF) 0.1 MG tablet Take 0.1 mg by mouth daily.    Marland Kitchen gabapentin (NEURONTIN) 300 MG capsule Pt takes one capsule in the morning, one at noon, and two at bedtime. 360 capsule 3  . glipiZIDE (GLUCOTROL) 5 MG tablet Take 1 tablet (5 mg total) by mouth 2 (two) times daily before a meal. 180 tablet 3  . Insulin Glargine (LANTUS SOLOSTAR) 100 UNIT/ML Solostar Pen Inject 10-30 Units into the skin daily. 15 mL 11  . loratadine (CLARITIN) 10 MG tablet Take 10 mg by mouth 2 (two) times daily.     Marland Kitchen lovastatin (MEVACOR) 40 MG tablet Take 80 mg by mouth at bedtime.    . methadone (DOLOPHINE) 10 MG tablet Take 2 tablets (20 mg total) by mouth 4 (four) times daily. Fill in about 1 week 240  tablet 0  . metolazone (ZAROXOLYN) 2.5 MG tablet Take 2.5 mg by mouth daily as needed (when pts weight reaches 157 or greater).     . metoprolol tartrate (LOPRESSOR) 25 MG tablet Take 0.5 tablets (12.5 mg total) by mouth 2 (two) times daily. 90 tablet 3  . mirtazapine (REMERON) 30 MG tablet Take 15 mg by mouth at bedtime.    . nitroGLYCERIN (NITROSTAT) 0.4 MG SL tablet Place 1 tablet (0.4 mg total) under the tongue every 5 (five) minutes as needed for chest pain. 100 tablet 1  . ONE TOUCH ULTRA TEST test strip USE 1 STRIP TO TEST BLOOD SUGAR ONCE DAILY 100 each 4  . pantoprazole (PROTONIX) 40 MG tablet Take 1 tablet (40 mg total) by mouth 2 (two) times daily. 180 tablet 3  . Patiromer Sorbitex Calcium (VELTASSA) 16.8 g PACK Take 1 Package by mouth daily.    . potassium chloride SA (K-DUR,KLOR-CON) 20 MEQ tablet Take 20 mEq by mouth as needed (when taking Metolazone).     . Tiotropium Bromide-Olodaterol (STIOLTO RESPIMAT) 2.5-2.5 MCG/ACT AERS Inhale 2 puffs into the lungs daily. 4 g 5  . torsemide (DEMADEX) 20 MG tablet Take 1 tablet (20 mg total) by mouth 2 (two) times daily. 1 tablet 0   No current facility-administered medications on file prior to visit.  Allergies  Allergen Reactions  . Sertraline Hcl Other (See Comments)    Malaise    Past Medical History:  Diagnosis Date  . Allergy   . Arthritis   . CAD (coronary artery disease)   . Depression   . Esophageal stricture   . Familial hematuria   . GERD (gastroesophageal reflux disease)   . Hyperlipidemia   . Hypertension   . IBS (irritable bowel syndrome)   . Nephrolithiasis   . NIDDM (non-insulin dependent diabetes mellitus)    with neuropathy  . Obesity, unspecified   . Pulmonary hypertension   . PVD (peripheral vascular disease) ([place])     Past Surgical History:  Procedure Laterality Date  . ABDOMINAL HYSTERECTOMY    . CARPAL TUNNEL RELEASE    . CESAREAN SECTION    . RIGHT HEART CATHETERIZATION N/A 12/05/2014    Procedure: RIGHT HEART CATH;  Surgeon: Sinclair Grooms, MD;  Location: [facility] CATH LAB;  Service: Cardiovascular;  Laterality: N/A;    Family History  Problem Relation Age of Onset  . Diabetes Mother   . Cancer Mother     ovarian,melanoma  . Allergies Father   . Cancer Father     lung  . Cancer Maternal Grandmother     uterine  . Emphysema Paternal Grandfather   . Allergies Sister   . Breast cancer Neg Hx     Social History   Social History  . Marital status: Legally Separated    Spouse name: N/A  . Number of children: 1  . Years of education: N/A   Occupational History  . 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 Smoking status: Former Smoker    Packs/day: 1.50    Years: 20.00    Types: Cigarettes    Quit date: 01/04/2009  . Smokeless tobacco: Never Used  . Alcohol use No     Comment: heavy in the past  . Drug use: No  . Sexual activity: No   Other Topics Concern  . Not on file   Social History Narrative   No living will   Son [name] should make decisions for her if she is unable.    Would accept resuscitation attempts but no prolonged ventilation   Not sure about tube feeds but probably wouldn't want them if cognitively unaware   Review of Systems Sleeping a bit better now Appetite is normal Hasn't needed the albuterol-no wheezing No regular cough     Objective:   Physical Exam  Constitutional: She appears well-nourished. No distress.  Neck: No thyromegaly present.  Cardiovascular: Normal rate, regular rhythm and normal heart sounds.  Exam reveals no gallop.   No murmur heard. Feet warm without palpable pulses  Pulmonary/Chest: Effort normal and breath sounds normal. No respiratory distress. She has no wheezes. She has no rales.  Musculoskeletal: She exhibits no edema.  Lymphadenopathy:    She has no cervical adenopathy.  Skin:  Slight skin changes in calves No foot lesions  Psychiatric: She has a normal mood  and affect. Her behavior is normal.          Assessment & Plan:

## 2016-10-23 NOTE — Assessment & Plan Note (Signed)
Seems to be in remission Needs meds indefinitely

## 2016-10-23 NOTE — Assessment & Plan Note (Signed)
Stable on methadone CSRS shows only Rx from this office

## 2016-10-23 NOTE — Assessment & Plan Note (Signed)
Improved Hasn't need albuterol lately

## 2016-10-23 NOTE — Assessment & Plan Note (Signed)
Seems to be better now PVD but no claudication pain

## 2016-10-23 NOTE — Progress Notes (Signed)
Pre visit review using our clinic review tool, if applicable. No additional management support is needed unless otherwise documented below in the visit note.

## 2016-10-23 NOTE — Assessment & Plan Note (Signed)
Compensated now She is checking weights daily--especially on the fludrocortisone

## 2016-11-06 DIAGNOSIS — J449 Chronic obstructive pulmonary disease, unspecified: Secondary | ICD-10-CM | POA: Diagnosis not present

## 2016-11-06 DIAGNOSIS — I509 Heart failure, unspecified: Secondary | ICD-10-CM | POA: Diagnosis not present

## 2016-12-05 DIAGNOSIS — E1122 Type 2 diabetes mellitus with diabetic chronic kidney disease: Secondary | ICD-10-CM | POA: Diagnosis not present

## 2016-12-05 DIAGNOSIS — I10 Essential (primary) hypertension: Secondary | ICD-10-CM | POA: Diagnosis not present

## 2016-12-05 DIAGNOSIS — N183 Chronic kidney disease, stage 3 (moderate): Secondary | ICD-10-CM | POA: Diagnosis not present

## 2016-12-05 DIAGNOSIS — E871 Hypo-osmolality and hyponatremia: Secondary | ICD-10-CM | POA: Diagnosis not present

## 2016-12-07 DIAGNOSIS — I509 Heart failure, unspecified: Secondary | ICD-10-CM | POA: Diagnosis not present

## 2016-12-07 DIAGNOSIS — J449 Chronic obstructive pulmonary disease, unspecified: Secondary | ICD-10-CM | POA: Diagnosis not present

## 2016-12-25 ENCOUNTER — Other Ambulatory Visit (HOSPITAL_COMMUNITY): Payer: Self-pay | Admitting: Cardiology

## 2016-12-25 MED ORDER — POTASSIUM CHLORIDE CRYS ER 20 MEQ PO TBCR: 20.0000 meq | EXTENDED_RELEASE_TABLET | ORAL | 2 refills | Status: DC | PRN

## 2016-12-25 NOTE — Addendum Note (Signed)
Addended by: Kerry Dory on: 12/25/2016 03:50 PM   Modules accepted: Orders

## 2016-12-26 ENCOUNTER — Other Ambulatory Visit: Payer: Self-pay

## 2016-12-26 ENCOUNTER — Other Ambulatory Visit (HOSPITAL_COMMUNITY): Payer: Self-pay | Admitting: Cardiology

## 2016-12-26 MED ORDER — POTASSIUM CHLORIDE CRYS ER 20 MEQ PO TBCR: 20.0000 meq | EXTENDED_RELEASE_TABLET | ORAL | 2 refills | Status: DC | PRN

## 2016-12-26 MED ORDER — METHADONE HCL 10 MG PO TABS: 20.0000 mg | ORAL_TABLET | Freq: Four times a day (QID) | ORAL | 0 refills | Status: DC

## 2016-12-26 NOTE — Telephone Encounter (Signed)
Pt left v/m requesting rx for methadone. Call when ready for pick up. Last printed # 240 on 10/14/16. Last seen 10/23/16.

## 2016-12-26 NOTE — Telephone Encounter (Signed)
Spoke to pt and informed her Rx is available for pickup from the front desk. Pt advised third party unable to pickup 

## 2016-12-31 DIAGNOSIS — N2581 Secondary hyperparathyroidism of renal origin: Secondary | ICD-10-CM | POA: Diagnosis not present

## 2016-12-31 DIAGNOSIS — I10 Essential (primary) hypertension: Secondary | ICD-10-CM | POA: Diagnosis not present

## 2016-12-31 DIAGNOSIS — E875 Hyperkalemia: Secondary | ICD-10-CM | POA: Diagnosis not present

## 2016-12-31 DIAGNOSIS — E871 Hypo-osmolality and hyponatremia: Secondary | ICD-10-CM | POA: Diagnosis not present

## 2016-12-31 DIAGNOSIS — N184 Chronic kidney disease, stage 4 (severe): Secondary | ICD-10-CM | POA: Diagnosis not present

## 2016-12-31 DIAGNOSIS — N183 Chronic kidney disease, stage 3 (moderate): Secondary | ICD-10-CM | POA: Diagnosis not present

## 2017-01-04 DIAGNOSIS — J449 Chronic obstructive pulmonary disease, unspecified: Secondary | ICD-10-CM | POA: Diagnosis not present

## 2017-01-04 DIAGNOSIS — I509 Heart failure, unspecified: Secondary | ICD-10-CM | POA: Diagnosis not present

## 2017-01-06 ENCOUNTER — Other Ambulatory Visit: Payer: Self-pay

## 2017-01-06 MED ORDER — PANTOPRAZOLE SODIUM 40 MG PO TBEC: 40.0000 mg | DELAYED_RELEASE_TABLET | Freq: Two times a day (BID) | ORAL | 1 refills | Status: DC

## 2017-01-06 MED ORDER — PANTOPRAZOLE SODIUM 40 MG PO TBEC: 40.0000 mg | DELAYED_RELEASE_TABLET | Freq: Two times a day (BID) | ORAL | 0 refills | Status: DC

## 2017-01-06 NOTE — Telephone Encounter (Signed)
Pt request refill pantoprazole 30 day to [facility] and 3 month to [facility]. Refilled per protocol andpt voiced understanding.

## 2017-01-09 DIAGNOSIS — E1122 Type 2 diabetes mellitus with diabetic chronic kidney disease: Secondary | ICD-10-CM | POA: Diagnosis not present

## 2017-01-09 DIAGNOSIS — E871 Hypo-osmolality and hyponatremia: Secondary | ICD-10-CM | POA: Diagnosis not present

## 2017-01-09 DIAGNOSIS — I10 Essential (primary) hypertension: Secondary | ICD-10-CM | POA: Diagnosis not present

## 2017-01-09 DIAGNOSIS — N183 Chronic kidney disease, stage 3 (moderate): Secondary | ICD-10-CM | POA: Diagnosis not present

## 2017-01-27 ENCOUNTER — Inpatient Hospital Stay
Admission: EM | Admit: 2017-01-27 | Discharge: 2017-01-29 | DRG: 641 | Disposition: A | Discharge status: Home / Self Care | Payer: PPO | Attending: Internal Medicine | Admitting: Internal Medicine

## 2017-01-27 ENCOUNTER — Encounter: Payer: Self-pay | Admitting: *Deleted

## 2017-01-27 ENCOUNTER — Telehealth: Payer: Self-pay | Admitting: Internal Medicine

## 2017-01-27 ENCOUNTER — Encounter (INDEPENDENT_AMBULATORY_CARE_PROVIDER_SITE_OTHER): Payer: Self-pay

## 2017-01-27 ENCOUNTER — Encounter: Payer: Self-pay | Admitting: Family Medicine

## 2017-01-27 ENCOUNTER — Ambulatory Visit (INDEPENDENT_AMBULATORY_CARE_PROVIDER_SITE_OTHER): Payer: PPO | Admitting: Family Medicine

## 2017-01-27 ENCOUNTER — Emergency Department: Payer: PPO

## 2017-01-27 ENCOUNTER — Telehealth: Payer: Self-pay

## 2017-01-27 VITALS — BP 128/70 | HR 92 | Temp 98.6°F | Wt 166.8 lb

## 2017-01-27 DIAGNOSIS — Z90710 Acquired absence of both cervix and uterus: Secondary | ICD-10-CM

## 2017-01-27 DIAGNOSIS — N179 Acute kidney failure, unspecified: Secondary | ICD-10-CM | POA: Diagnosis not present

## 2017-01-27 DIAGNOSIS — F39 Unspecified mood [affective] disorder: Secondary | ICD-10-CM | POA: Diagnosis present

## 2017-01-27 DIAGNOSIS — IMO0002 Reserved for concepts with insufficient information to code with codable children: Secondary | ICD-10-CM

## 2017-01-27 DIAGNOSIS — E1122 Type 2 diabetes mellitus with diabetic chronic kidney disease: Secondary | ICD-10-CM | POA: Diagnosis present

## 2017-01-27 DIAGNOSIS — Z79899 Other long term (current) drug therapy: Secondary | ICD-10-CM

## 2017-01-27 DIAGNOSIS — E1151 Type 2 diabetes mellitus with diabetic peripheral angiopathy without gangrene: Secondary | ICD-10-CM

## 2017-01-27 DIAGNOSIS — I272 Pulmonary hypertension, unspecified: Secondary | ICD-10-CM | POA: Diagnosis present

## 2017-01-27 DIAGNOSIS — Z9981 Dependence on supplemental oxygen: Secondary | ICD-10-CM | POA: Diagnosis not present

## 2017-01-27 DIAGNOSIS — E860 Dehydration: Secondary | ICD-10-CM | POA: Diagnosis present

## 2017-01-27 DIAGNOSIS — T502X5A Adverse effect of carbonic-anhydrase inhibitors, benzothiadiazides and other diuretics, initial encounter: Secondary | ICD-10-CM | POA: Diagnosis not present

## 2017-01-27 DIAGNOSIS — I130 Hypertensive heart and chronic kidney disease with heart failure and stage 1 through stage 4 chronic kidney disease, or unspecified chronic kidney disease: Secondary | ICD-10-CM | POA: Diagnosis present

## 2017-01-27 DIAGNOSIS — N1330 Unspecified hydronephrosis: Secondary | ICD-10-CM | POA: Diagnosis present

## 2017-01-27 DIAGNOSIS — E875 Hyperkalemia: Secondary | ICD-10-CM | POA: Diagnosis present

## 2017-01-27 DIAGNOSIS — R945 Abnormal results of liver function studies: Secondary | ICD-10-CM | POA: Diagnosis not present

## 2017-01-27 DIAGNOSIS — E561 Deficiency of vitamin K: Secondary | ICD-10-CM | POA: Diagnosis present

## 2017-01-27 DIAGNOSIS — Z888 Allergy status to other drugs, medicaments and biological substances status: Secondary | ICD-10-CM

## 2017-01-27 DIAGNOSIS — R531 Weakness: Secondary | ICD-10-CM | POA: Diagnosis not present

## 2017-01-27 DIAGNOSIS — Z833 Family history of diabetes mellitus: Secondary | ICD-10-CM

## 2017-01-27 DIAGNOSIS — R740 Nonspecific elevation of levels of transaminase and lactic acid dehydrogenase [LDH]: Secondary | ICD-10-CM | POA: Diagnosis not present

## 2017-01-27 DIAGNOSIS — E785 Hyperlipidemia, unspecified: Secondary | ICD-10-CM | POA: Diagnosis present

## 2017-01-27 DIAGNOSIS — E876 Hypokalemia: Secondary | ICD-10-CM | POA: Diagnosis not present

## 2017-01-27 DIAGNOSIS — Z79891 Long term (current) use of opiate analgesic: Secondary | ICD-10-CM

## 2017-01-27 DIAGNOSIS — R933 Abnormal findings on diagnostic imaging of other parts of digestive tract: Secondary | ICD-10-CM | POA: Diagnosis not present

## 2017-01-27 DIAGNOSIS — N184 Chronic kidney disease, stage 4 (severe): Secondary | ICD-10-CM | POA: Diagnosis not present

## 2017-01-27 DIAGNOSIS — N189 Chronic kidney disease, unspecified: Secondary | ICD-10-CM

## 2017-01-27 DIAGNOSIS — I5032 Chronic diastolic (congestive) heart failure: Secondary | ICD-10-CM | POA: Diagnosis not present

## 2017-01-27 DIAGNOSIS — F329 Major depressive disorder, single episode, unspecified: Secondary | ICD-10-CM | POA: Diagnosis present

## 2017-01-27 DIAGNOSIS — K219 Gastro-esophageal reflux disease without esophagitis: Secondary | ICD-10-CM | POA: Diagnosis present

## 2017-01-27 DIAGNOSIS — D649 Anemia, unspecified: Secondary | ICD-10-CM | POA: Diagnosis not present

## 2017-01-27 DIAGNOSIS — E1165 Type 2 diabetes mellitus with hyperglycemia: Secondary | ICD-10-CM | POA: Diagnosis not present

## 2017-01-27 DIAGNOSIS — I129 Hypertensive chronic kidney disease with stage 1 through stage 4 chronic kidney disease, or unspecified chronic kidney disease: Secondary | ICD-10-CM | POA: Diagnosis not present

## 2017-01-27 DIAGNOSIS — N281 Cyst of kidney, acquired: Secondary | ICD-10-CM | POA: Diagnosis present

## 2017-01-27 DIAGNOSIS — E1142 Type 2 diabetes mellitus with diabetic polyneuropathy: Secondary | ICD-10-CM | POA: Diagnosis present

## 2017-01-27 DIAGNOSIS — R110 Nausea: Secondary | ICD-10-CM

## 2017-01-27 DIAGNOSIS — K828 Other specified diseases of gallbladder: Secondary | ICD-10-CM | POA: Diagnosis not present

## 2017-01-27 DIAGNOSIS — J9611 Chronic respiratory failure with hypoxia: Secondary | ICD-10-CM | POA: Diagnosis present

## 2017-01-27 DIAGNOSIS — N289 Disorder of kidney and ureter, unspecified: Secondary | ICD-10-CM

## 2017-01-27 DIAGNOSIS — I2510 Atherosclerotic heart disease of native coronary artery without angina pectoris: Secondary | ICD-10-CM | POA: Diagnosis present

## 2017-01-27 DIAGNOSIS — R7989 Other specified abnormal findings of blood chemistry: Secondary | ICD-10-CM | POA: Diagnosis not present

## 2017-01-27 DIAGNOSIS — Z794 Long term (current) use of insulin: Secondary | ICD-10-CM

## 2017-01-27 DIAGNOSIS — I248 Other forms of acute ischemic heart disease: Secondary | ICD-10-CM | POA: Diagnosis present

## 2017-01-27 DIAGNOSIS — D631 Anemia in chronic kidney disease: Secondary | ICD-10-CM | POA: Diagnosis not present

## 2017-01-27 DIAGNOSIS — Z87891 Personal history of nicotine dependence: Secondary | ICD-10-CM

## 2017-01-27 DIAGNOSIS — Z7982 Long term (current) use of aspirin: Secondary | ICD-10-CM

## 2017-01-27 DIAGNOSIS — Z825 Family history of asthma and other chronic lower respiratory diseases: Secondary | ICD-10-CM

## 2017-01-27 DIAGNOSIS — D509 Iron deficiency anemia, unspecified: Secondary | ICD-10-CM | POA: Diagnosis not present

## 2017-01-27 LAB — BASIC METABOLIC PANEL
ANION GAP: 13 (ref 5–15)
BUN: 48 mg/dL — ABNORMAL HIGH (ref 6–20)
CALCIUM: 8.1 mg/dL — AB (ref 8.9–10.3)
CHLORIDE: 85 mmol/L — AB (ref 101–111)
CO2: 33 mmol/L — AB (ref 22–32)
Creatinine, Ser: 3.11 mg/dL — ABNORMAL HIGH (ref 0.44–1.00)
GFR calc non Af Amer: 15 mL/min — ABNORMAL LOW (ref >60)
GFR, EST AFRICAN AMERICAN: 17 mL/min — AB (ref >60)
Glucose, Bld: 187 mg/dL — ABNORMAL HIGH (ref 65–99)
POTASSIUM: 2.6 mmol/L — AB (ref 3.5–5.1)
Sodium: 131 mmol/L — ABNORMAL LOW (ref 135–145)

## 2017-01-27 LAB — CBC WITH DIFFERENTIAL/PLATELET
BASOS PCT: 0.3 % (ref 0.0–3.0)
Basophils Absolute: 0 10*3/uL (ref 0.0–0.1)
EOS ABS: 0 10*3/uL (ref 0.0–0.7)
EOS PCT: 0.1 % (ref 0.0–5.0)
HEMATOCRIT: 31.7 % — AB (ref 36.0–46.0)
HEMOGLOBIN: 10.2 g/dL — AB (ref 12.0–15.0)
Lymphocytes Relative: 15.9 % (ref 12.0–46.0)
Lymphs Abs: 1.8 10*3/uL (ref 0.7–4.0)
MCHC: 32.2 g/dL (ref 30.0–36.0)
MCV: 74.7 fl — ABNORMAL LOW (ref 78.0–100.0)
MONOS PCT: 6.7 % (ref 3.0–12.0)
Monocytes Absolute: 0.7 10*3/uL (ref 0.1–1.0)
Neutro Abs: 8.5 10*3/uL — ABNORMAL HIGH (ref 1.4–7.7)
Neutrophils Relative %: 77 % (ref 43.0–77.0)
Platelets: 280 10*3/uL (ref 150.0–400.0)
RBC: 4.25 Mil/uL (ref 3.87–5.11)
RDW: 18.3 % — AB (ref 11.5–15.5)
WBC: 11.1 10*3/uL — AB (ref 4.0–10.5)

## 2017-01-27 LAB — URINALYSIS, COMPLETE (UACMP) WITH MICROSCOPIC
BACTERIA UA: NONE SEEN
Bilirubin Urine: NEGATIVE
Glucose, UA: NEGATIVE mg/dL
Ketones, ur: NEGATIVE mg/dL
Leukocytes, UA: NEGATIVE
Nitrite: NEGATIVE
PROTEIN: NEGATIVE mg/dL
RBC / HPF: NONE SEEN RBC/hpf (ref 0–5)
Specific Gravity, Urine: 1.004 — ABNORMAL LOW (ref 1.005–1.030)
Squamous Epithelial / LPF: NONE SEEN
pH: 6 (ref 5.0–8.0)

## 2017-01-27 LAB — COMPREHENSIVE METABOLIC PANEL
ALT: 507 U/L — AB (ref 0–35)
AST: 923 U/L — AB (ref 0–37)
Albumin: 3.4 g/dL — ABNORMAL LOW (ref 3.5–5.2)
Alkaline Phosphatase: 55 U/L (ref 39–117)
BILIRUBIN TOTAL: 0.9 mg/dL (ref 0.2–1.2)
BUN: 46 mg/dL — ABNORMAL HIGH (ref 6–23)
CO2: 38 meq/L — AB (ref 19–32)
CREATININE: 2.96 mg/dL — AB (ref 0.40–1.20)
Calcium: 8.7 mg/dL (ref 8.4–10.5)
Chloride: 86 mEq/L — ABNORMAL LOW (ref 96–112)
GFR: 17 mL/min — AB (ref >60.00)
GLUCOSE: 101 mg/dL — AB (ref 70–99)
Potassium: 2.7 mEq/L — CL (ref 3.5–5.1)
SODIUM: 135 meq/L (ref 135–145)
Total Protein: 6.6 g/dL (ref 6.0–8.3)

## 2017-01-27 LAB — CBC
HEMATOCRIT: 32 % — AB (ref 35.0–47.0)
HEMOGLOBIN: 10.4 g/dL — AB (ref 12.0–16.0)
MCH: 24.4 pg — AB (ref 26.0–34.0)
MCHC: 32.4 g/dL (ref 32.0–36.0)
MCV: 75.3 fL — AB (ref 80.0–100.0)
Platelets: 261 10*3/uL (ref 150–440)
RBC: 4.25 MIL/uL (ref 3.80–5.20)
RDW: 18.9 % — ABNORMAL HIGH (ref 11.5–14.5)
WBC: 10.5 10*3/uL (ref 3.6–11.0)

## 2017-01-27 LAB — GLUCOSE, CAPILLARY: GLUCOSE-CAPILLARY: 196 mg/dL — AB (ref 65–99)

## 2017-01-27 LAB — HEMOGLOBIN A1C: Hgb A1c MFr Bld: 8.7 % — ABNORMAL HIGH (ref 4.6–6.5)

## 2017-01-27 MED ORDER — POTASSIUM CHLORIDE IN NACL 20-0.9 MEQ/L-% IV SOLN
Freq: Once | INTRAVENOUS | Status: AC
  Administered 2017-01-27: 23:00:00 via INTRAVENOUS
  Filled 2017-01-27: qty 1000

## 2017-01-27 MED ORDER — SODIUM CHLORIDE 0.9 % IV BOLUS (SEPSIS)
1000.0000 mL | Freq: Once | INTRAVENOUS | Status: AC
  Administered 2017-01-27: 1000 mL via INTRAVENOUS

## 2017-01-27 MED ORDER — INSULIN GLARGINE 100 UNIT/ML SOLOSTAR PEN: 30.0000 [IU] | PEN_INJECTOR | Freq: Every day | SUBCUTANEOUS | Status: DC

## 2017-01-27 MED ORDER — POTASSIUM CHLORIDE CRYS ER 20 MEQ PO TBCR
40.0000 meq | EXTENDED_RELEASE_TABLET | Freq: Once | ORAL | Status: AC
  Administered 2017-01-27: 40 meq via ORAL
  Filled 2017-01-27: qty 2

## 2017-01-27 NOTE — Assessment & Plan Note (Addendum)
The original plan was to inc her insulin if needed based on AM readings and check labs related to nausea.  Whatever was causing the nausea was likely causing the inc in sugar.  D/w pt.  She had benign abd exam and no jaundice.  D/w pt.  She appeared okay for outpatient fu.  Labs noted, high Cr, low K, and very high LFTs noted with unclear source for recent illness.  D/w pt.  Needs ER eval.  Triage at [facility] ER notified, patient already called by staff.  App help of all involved.  >25 minutes spent in face to face time with patient, >50% spent in counselling or coordination of care.

## 2017-01-27 NOTE — Telephone Encounter (Signed)
[facility] called with critical result on potassium. Potassium of 2.7. Emelia Salisbury, LPN notified and will notify Dr. Damita Dunnings.

## 2017-01-27 NOTE — ED Notes (Signed)
Patient ambulated with steady gait to and from room commode with portable O2.

## 2017-01-27 NOTE — ED Provider Notes (Signed)
[facility] Emergency Department Provider Note  Time seen: 9:44 PM  I have reviewed the triage vital signs and the nursing notes.   HISTORY  Chief Complaint Abnormal Lab    HPI Lindsay Harmon is a 61 y.o. female with a past medical history of CAD, arthritis, gastric reflux, hypertension, hyperlipidemia, diabetes, presents to the emergency department with nausea, weakness, low potassium. According to the patient the past one week she has been feeling generalized weakness with occasional nausea. Patient went to her primary care doctor for evaluation and was called back today due to abnormal labs. Patient was found to have a potassium of 2.7 and told to go to the emergency department. Patient's blood work appears to show a low potassium as well as elevated liver function test. Patient denies any alcohol or acetaminophen use. Patient does have a gallbladder but denies any abdominal pain including any right upper quadrant pain. Denies any vomiting denies diarrhea. Denies fever.  Past Medical History:  Diagnosis Date  . Allergy   . Arthritis   . CAD (coronary artery disease)   . Depression   . Esophageal stricture   . Familial hematuria   . GERD (gastroesophageal reflux disease)   . Hyperlipidemia   . Hypertension   . IBS (irritable bowel syndrome)   . Nephrolithiasis   . NIDDM (non-insulin dependent diabetes mellitus)    with neuropathy  . Obesity, unspecified   . Pulmonary hypertension   . PVD (peripheral vascular disease) [facility])     Patient Active Problem List   Diagnosis Date Noted  . Nausea 01/27/2017  . Angina at rest [facility]) 07/23/2016  . MDD (major depressive disorder), recurrent episode, mild ([place]) 05/02/2016  . Epistaxis 01/24/2016  . Acute sinus infection 11/10/2015  . Swelling of lower extremity 07/13/2015  . Hyposmolality and/or hyponatremia 06/22/2015  . Cor pulmonale, chronic ([place])   . Pulmonary artery hypertension   . Hyponatremia  06/02/2015  . Pulmonary hypertension   . RVF (right ventricular failure) ([place]) 01/17/2015  . Chronic respiratory failure with hypoxia ([place]) 12/15/2014  . COPD (chronic obstructive pulmonary disease) ([place]) 12/15/2014  . Demand ischemia ([place]) 12/05/2014  . Chronic diastolic heart failure ([place]) 12/05/2014  . Pulmonary HTN 12/05/2014  . Chronic renal disease, stage IV ([place]) 12/01/2014  . Advance directive discussed with patient 11/08/2014  . DM (diabetes mellitus), type 2, uncontrolled, periph vascular complic ([place]) 29/93/7169  . Diabetes, polyneuropathy ([place]) 09/15/2013  . Routine general medical examination at a health care facility 08/13/2011  . Coronary atherosclerosis of native coronary artery 02/27/2007  . Chronic back pain 02/27/2007  . Type 2 diabetes mellitus with neurological manifestations, controlled ([place]) 02/26/2007  . Hyperlipemia 02/26/2007  . Episodic mood disorder ([place]) 02/26/2007  . Essential hypertension, benign 02/26/2007  . PVD (peripheral vascular disease) ([place]) 02/26/2007  . GERD 02/26/2007  . DEGENERATIVE JOINT DISEASE 02/26/2007    Past Surgical History:  Procedure Laterality Date  . ABDOMINAL HYSTERECTOMY    . CARPAL TUNNEL RELEASE    . CESAREAN SECTION    . RIGHT HEART CATHETERIZATION N/A 12/05/2014   Procedure: RIGHT HEART CATH;  Surgeon: Sinclair Grooms, MD;  Location: [facility] CATH LAB;  Service: Cardiovascular;  Laterality: N/A;    Prior to Admission medications   Medication Sig Start Date End Date Taking? Authorizing Provider  albuterol (PROVENTIL HFA;VENTOLIN HFA) 108 (90 Base) MCG/ACT inhaler Inhale 2 puffs into the lungs every 6 (six) hours as needed for wheezing or shortness of breath. 02/07/16  Yes Juanito Doom, MD  ALPRAZolam Duanne Moron) 0.25 MG tablet Take 0.25 mg by mouth 2 (two) times daily.    Yes Historical Provider, MD  aspirin 325 MG tablet Take 325 mg by mouth at bedtime.    Yes Historical Provider, MD  calcitRIOL (ROCALTROL) 0.25 MCG capsule  Take 0.25 mcg by mouth daily.   Yes Historical Provider, MD  cholecalciferol (VITAMIN D) 1000 units tablet Take 1,000 Units by mouth daily.   Yes Historical Provider, MD  enalapril (VASOTEC) 20 MG tablet Take 20 mg by mouth daily.   Yes Historical Provider, MD  fludrocortisone (FLORINEF) 0.1 MG tablet Take 0.1 mg by mouth daily.   Yes Historical Provider, MD  gabapentin (NEURONTIN) 300 MG capsule Pt takes one capsule in the morning, one at noon, and two at bedtime. 07/23/16  Yes Venia Carbon, MD  glipiZIDE (GLUCOTROL) 5 MG tablet Take 1 tablet (5 mg total) by mouth 2 (two) times daily before a meal. 07/23/16  Yes Venia Carbon, MD  Insulin Glargine (LANTUS SOLOSTAR) 100 UNIT/ML Solostar Pen Inject 30 Units into the skin daily. 01/27/17  Yes Tonia Ghent, MD  loratadine (CLARITIN) 10 MG tablet Take 10 mg by mouth 2 (two) times daily.    Yes Historical Provider, MD  lovastatin (MEVACOR) 40 MG tablet Take 80 mg by mouth at bedtime.   Yes Historical Provider, MD  methadone (DOLOPHINE) 10 MG tablet Take 2 tablets (20 mg total) by mouth 4 (four) times daily. Fill in about 1 week 12/26/16  Yes Venia Carbon, MD  metolazone (ZAROXOLYN) 2.5 MG tablet Take 2.5 mg by mouth daily as needed (when pts weight reaches 157 or greater).    Yes Historical Provider, MD  metoprolol tartrate (LOPRESSOR) 25 MG tablet Take 0.5 tablets (12.5 mg total) by mouth 2 (two) times daily. 12/27/15  Yes Larey Dresser, MD  mirtazapine (REMERON) 30 MG tablet Take 15 mg by mouth at bedtime.   Yes Historical Provider, MD  nitroGLYCERIN (NITROSTAT) 0.4 MG SL tablet Place 1 tablet (0.4 mg total) under the tongue every 5 (five) minutes as needed for chest pain. 07/23/16  Yes Venia Carbon, MD  pantoprazole (PROTONIX) 40 MG tablet Take 1 tablet (40 mg total) by mouth 2 (two) times daily. 01/06/17  Yes Venia Carbon, MD  Tiotropium Bromide-Olodaterol (STIOLTO RESPIMAT) 2.5-2.5 MCG/ACT AERS Inhale 2 puffs into the lungs daily.  09/26/16  Yes Juanito Doom, MD  torsemide (DEMADEX) 20 MG tablet Take 1 tablet (20 mg total) by mouth 2 (two) times daily. 07/24/16  Yes Venia Carbon, MD  ONE [facility] ULTRA TEST test strip USE 1 STRIP TO TEST BLOOD SUGAR ONCE DAILY 06/17/16   Venia Carbon, MD    Allergies  Allergen Reactions  . Sertraline Hcl Other (See Comments)    Malaise    Family History  Problem Relation Age of Onset  . Diabetes Mother   . Cancer Mother     ovarian,melanoma  . Allergies Father   . Cancer Father     lung  . Cancer Maternal Grandmother     uterine  . Emphysema Paternal Grandfather   . Allergies Sister   . Breast cancer Neg Hx     Social History Social History  Substance Use Topics  . Smoking status: Former Smoker    Packs/day: 1.50    Years: 20.00    Types: Cigarettes    Quit date: 01/04/2009  . Smokeless tobacco: Never Used  .  Alcohol use No     Comment: heavy in the past    Review of Systems Constitutional: Negative for fever. Positive for generalized weakness. Cardiovascular: Negative for chest pain. Respiratory: Negative for shortness of breath. Gastrointestinal: Negative for abdominal pain. Positive for nausea. Genitourinary: Negative for dysuria. Neurological: Negative for headache 10-point ROS otherwise negative.  ____________________________________________   PHYSICAL EXAM:  VITAL SIGNS: ED Triage Vitals  Enc Vitals Group     BP 01/27/17 1844 (!) 109/52     Pulse Rate 01/27/17 1844 93     Resp 01/27/17 1844 20     Temp 01/27/17 1844 99.1 F (37.3 C)     Temp Source 01/27/17 1844 Oral     SpO2 01/27/17 1844 90 %     Weight 01/27/17 1842 166 lb (75.3 kg)     Height 01/27/17 1842 _0  (1.575 m)     Head Circumference --      Peak Flow --      Pain Score 01/27/17 2100 5     Pain Loc --      Pain Edu? --      Excl. in [place]? --     Constitutional: Alert and oriented. Well appearing and in no distress. Eyes: Normal exam ENT   Head:  Normocephalic and atraumatic.   Mouth/Throat: Mucous membranes are moist. Cardiovascular: Normal rate, regular rhythm. No murmur Respiratory: Normal respiratory effort without tachypnea nor retractions. Breath sounds are clear  Gastrointestinal: Soft and nontender. No distention Musculoskeletal: Nontender with normal range of motion in all extremities.  Neurologic:  Normal speech and language. No gross focal neurologic deficits  Skin:  Skin is warm, dry and intact.  Psychiatric: Mood and affect are normal.   ____________________________________________    EKG  EKG reviewed and interpreted by myself shows normal sinus rhythm at 89 bpm, narrow QRS, normal axis, normal intervals, nonspecific ST changes without ST elevation.  ____________________________________________    RADIOLOGY   IMPRESSION: Moderate gallbladder distention containing tumefactive biliary sludge and trace pericholecystic fluid. Intra and extrahepatic ductal dilatation. No definite evidence of choledocholithiasis. Biliary sludge within the bile ducts could potentially contribute to this appearance.  Moderate hydronephrosis of visualized right kidney.  ____________________________________________   INITIAL IMPRESSION / ASSESSMENT AND PLAN / ED COURSE  Pertinent labs & imaging results that were available during my care of the patient were reviewed by me and considered in my medical decision making (see chart for details).  Patient presents to the emergency department for generalized weakness and nausea for the past one week. Found to have hypokalemia and elevated liver function tests by her PCP. [place]  labs continue to show hypokalemia as well as acute on chronic renal insufficiency. We will begin IV hydration, treat with IV and oral potassium. I discussed the patient with her nephrologist Dr. Holley Raring. Patient will require admission to the hospital. Given the elevated LFTs we'll obtain a right upper quadrant  ultrasound to further evaluate. Patient denies any abdominal pain alcohol use or Tylenol use. Nontender abdomen on exam.  Given the abnormal ultrasound results I discussed the patient with Dr. Dahlia Byes of general surgery. He states he did not believe this to be gallbladder related given the elevated LFTs but normal alkaline phosphatase as well as normal total bilirubin highly doubt biliary obstruction. He recommends medical workup including hepatitis panel. Patient has no abdominal pain. Given the patient's hypokalemia with acute on chronic renal insufficiency we will admit to the hospital for further treatment. ____________________________________________   FINAL  CLINICAL IMPRESSION(S) / ED DIAGNOSES  hypokalemia Elevated liver function tests Weakness Nausea    Harvest Dark, MD 01/27/17 2300

## 2017-01-27 NOTE — Progress Notes (Signed)
Pre visit review using our clinic review tool, if applicable. No additional management support is needed unless otherwise documented below in the visit note.

## 2017-01-27 NOTE — ED Notes (Signed)
Patient left for imaging.

## 2017-01-27 NOTE — ED Triage Notes (Signed)
States she was sent to ED from PCP, had blood work done and was told her K-dur was critical, and elevated liver enzymes, states she has felt weak and nausea for the past week, pt on 3L [place] with movement, hx of COPD

## 2017-01-27 NOTE — ED Notes (Signed)
Date and time results received: 01/27/17 1935   Test: Potassium Critical Value: 2.6  Name of Provider Notified: Kinner  Orders Received? Or Actions Taken?: MD Kinner notified. No new orders at this time, as patient was being seen for abnormal lab results. Agricultural consultant notified.

## 2017-01-27 NOTE — Telephone Encounter (Signed)
Glad she could get in here today

## 2017-01-27 NOTE — Assessment & Plan Note (Signed)
See notes on labs.  See AVS.

## 2017-01-27 NOTE — Patient Instructions (Signed)
Go to the lab on the way out.  We'll contact you with your lab report. If AM sugar >175, then add 1 unit.  If AM sugar <125, then take away 1 unit.  If Am sugar 125-174, then no change in dose.  Take care.  Glad to see you.

## 2017-01-27 NOTE — Telephone Encounter (Signed)
[facility]  Patient Name: Lindsay Harmon  DOB: 1956-05-24    Initial Comment caller states he BS is over 200, she has nausea and doesn't feel well   Nurse Assessment  Nurse: Wynetta Emery, RN, Baker Janus Date/Time (Eastern Time): 01/27/2017 9:33:08 AM  Confirm and document reason for call. If symptomatic, describe symptoms. ---[name] has had high blood sugars x 5 days and 200 to 220 nauseated feels feverish at times no temperature  Does the patient have any new or worsening symptoms? ---Yes  Will a triage be completed? ---Yes  Related visit to physician within the last 2 weeks? ---No  Does the PT have any chronic conditions? (i.e. diabetes, asthma, etc.) ---Yes  List chronic conditions. ---Diabetes High Cholesterol Heart Blockages Oxygen Therapy 2 L to 3 L per nasal cannula HTN  Is this a behavioral health or substance abuse call? ---No     Guidelines    Guideline Title Affirmed Question Affirmed Notes  Diabetes - High Blood Sugar Blood glucose 60-240 mg/dl (3.5 -13 mmol/l) (all triage questions negative)    Final Disposition User   See Physician within 24 Hours Wynetta Emery, RN, Baker Janus    Comments  NOTE No appointment for Dr. Silvio Pate; advised she wants appt today -- 01/27/2017 appt with Orlinda Blalock, MD 215am for c/o elevated blood sugars x 5 days and nausea and not feeling good not sure what is going on"   Referrals  REFERRED TO PCP OFFICE   Disagree/Comply: Comply

## 2017-01-27 NOTE — Progress Notes (Signed)
Sugar has been running higher recently.  Up to ~200.  Had been on 30 units of insulin recently.  No change in meds.  She has had some episodic nausea for the last 6 days.  No vomiting.  No diarrhea.  No known fevers but felt hot.  No rash.  Still on O2.  No cough.  No wheeze.  Diet has been decreased recently due to nausea but sugar still elevated.    PMH and SH reviewed  ROS: Per HPI unless specifically indicated in ROS section   Meds, vitals, and allergies reviewed.   On O2 via [place] NAD  Mmm OP wnl Neck supple, no LA rrr ctab abd soft, not ttp Ext w/o edema.   No jaundice.

## 2017-01-28 ENCOUNTER — Inpatient Hospital Stay: Payer: PPO

## 2017-01-28 DIAGNOSIS — J9611 Chronic respiratory failure with hypoxia: Secondary | ICD-10-CM | POA: Diagnosis not present

## 2017-01-28 DIAGNOSIS — R531 Weakness: Secondary | ICD-10-CM | POA: Diagnosis not present

## 2017-01-28 DIAGNOSIS — T502X5A Adverse effect of carbonic-anhydrase inhibitors, benzothiadiazides and other diuretics, initial encounter: Secondary | ICD-10-CM | POA: Diagnosis not present

## 2017-01-28 DIAGNOSIS — I2510 Atherosclerotic heart disease of native coronary artery without angina pectoris: Secondary | ICD-10-CM | POA: Diagnosis not present

## 2017-01-28 DIAGNOSIS — R933 Abnormal findings on diagnostic imaging of other parts of digestive tract: Secondary | ICD-10-CM

## 2017-01-28 DIAGNOSIS — D509 Iron deficiency anemia, unspecified: Secondary | ICD-10-CM | POA: Diagnosis not present

## 2017-01-28 DIAGNOSIS — R7989 Other specified abnormal findings of blood chemistry: Secondary | ICD-10-CM

## 2017-01-28 DIAGNOSIS — K760 Fatty (change of) liver, not elsewhere classified: Secondary | ICD-10-CM | POA: Diagnosis not present

## 2017-01-28 DIAGNOSIS — N184 Chronic kidney disease, stage 4 (severe): Secondary | ICD-10-CM | POA: Diagnosis not present

## 2017-01-28 DIAGNOSIS — E860 Dehydration: Secondary | ICD-10-CM | POA: Diagnosis not present

## 2017-01-28 DIAGNOSIS — E1151 Type 2 diabetes mellitus with diabetic peripheral angiopathy without gangrene: Secondary | ICD-10-CM | POA: Diagnosis not present

## 2017-01-28 DIAGNOSIS — N179 Acute kidney failure, unspecified: Secondary | ICD-10-CM | POA: Diagnosis not present

## 2017-01-28 DIAGNOSIS — E876 Hypokalemia: Secondary | ICD-10-CM | POA: Diagnosis not present

## 2017-01-28 DIAGNOSIS — E785 Hyperlipidemia, unspecified: Secondary | ICD-10-CM | POA: Diagnosis not present

## 2017-01-28 DIAGNOSIS — E1122 Type 2 diabetes mellitus with diabetic chronic kidney disease: Secondary | ICD-10-CM | POA: Diagnosis not present

## 2017-01-28 DIAGNOSIS — I130 Hypertensive heart and chronic kidney disease with heart failure and stage 1 through stage 4 chronic kidney disease, or unspecified chronic kidney disease: Secondary | ICD-10-CM | POA: Diagnosis not present

## 2017-01-28 DIAGNOSIS — K5710 Diverticulosis of small intestine without perforation or abscess without bleeding: Secondary | ICD-10-CM | POA: Diagnosis not present

## 2017-01-28 DIAGNOSIS — I248 Other forms of acute ischemic heart disease: Secondary | ICD-10-CM | POA: Diagnosis not present

## 2017-01-28 DIAGNOSIS — E875 Hyperkalemia: Secondary | ICD-10-CM | POA: Diagnosis not present

## 2017-01-28 DIAGNOSIS — K7460 Unspecified cirrhosis of liver: Secondary | ICD-10-CM | POA: Diagnosis not present

## 2017-01-28 DIAGNOSIS — Z9981 Dependence on supplemental oxygen: Secondary | ICD-10-CM | POA: Diagnosis not present

## 2017-01-28 DIAGNOSIS — N1330 Unspecified hydronephrosis: Secondary | ICD-10-CM | POA: Diagnosis not present

## 2017-01-28 DIAGNOSIS — D649 Anemia, unspecified: Secondary | ICD-10-CM | POA: Diagnosis not present

## 2017-01-28 DIAGNOSIS — R740 Nonspecific elevation of levels of transaminase and lactic acid dehydrogenase [LDH]: Secondary | ICD-10-CM | POA: Diagnosis not present

## 2017-01-28 DIAGNOSIS — I272 Pulmonary hypertension, unspecified: Secondary | ICD-10-CM | POA: Diagnosis not present

## 2017-01-28 DIAGNOSIS — N281 Cyst of kidney, acquired: Secondary | ICD-10-CM | POA: Diagnosis not present

## 2017-01-28 DIAGNOSIS — I5032 Chronic diastolic (congestive) heart failure: Secondary | ICD-10-CM | POA: Diagnosis not present

## 2017-01-28 DIAGNOSIS — E1142 Type 2 diabetes mellitus with diabetic polyneuropathy: Secondary | ICD-10-CM | POA: Diagnosis not present

## 2017-01-28 DIAGNOSIS — K219 Gastro-esophageal reflux disease without esophagitis: Secondary | ICD-10-CM | POA: Diagnosis not present

## 2017-01-28 DIAGNOSIS — D631 Anemia in chronic kidney disease: Secondary | ICD-10-CM | POA: Diagnosis not present

## 2017-01-28 LAB — COMPREHENSIVE METABOLIC PANEL
ALK PHOS: 47 U/L (ref 38–126)
ALT: 365 U/L — AB (ref 14–54)
AST: 446 U/L — ABNORMAL HIGH (ref 15–41)
Albumin: 2.6 g/dL — ABNORMAL LOW (ref 3.5–5.0)
Anion gap: 8 (ref 5–15)
BILIRUBIN TOTAL: 1.1 mg/dL (ref 0.3–1.2)
BUN: 43 mg/dL — ABNORMAL HIGH (ref 6–20)
CALCIUM: 7.5 mg/dL — AB (ref 8.9–10.3)
CO2: 34 mmol/L — ABNORMAL HIGH (ref 22–32)
CREATININE: 2.55 mg/dL — AB (ref 0.44–1.00)
Chloride: 97 mmol/L — ABNORMAL LOW (ref 101–111)
GFR calc non Af Amer: 19 mL/min — ABNORMAL LOW (ref >60)
GFR, EST AFRICAN AMERICAN: 22 mL/min — AB (ref >60)
Glucose, Bld: 181 mg/dL — ABNORMAL HIGH (ref 65–99)
Potassium: 2.5 mmol/L — CL (ref 3.5–5.1)
Sodium: 139 mmol/L (ref 135–145)
Total Protein: 5.7 g/dL — ABNORMAL LOW (ref 6.5–8.1)

## 2017-01-28 LAB — BASIC METABOLIC PANEL
ANION GAP: 8 (ref 5–15)
BUN: 34 mg/dL — ABNORMAL HIGH (ref 6–20)
CALCIUM: 7.7 mg/dL — AB (ref 8.9–10.3)
CO2: 33 mmol/L — AB (ref 22–32)
Chloride: 95 mmol/L — ABNORMAL LOW (ref 101–111)
Creatinine, Ser: 2.09 mg/dL — ABNORMAL HIGH (ref 0.44–1.00)
GFR, EST AFRICAN AMERICAN: 28 mL/min — AB (ref >60)
GFR, EST NON AFRICAN AMERICAN: 24 mL/min — AB (ref >60)
Glucose, Bld: 111 mg/dL — ABNORMAL HIGH (ref 65–99)
POTASSIUM: 3 mmol/L — AB (ref 3.5–5.1)
Sodium: 136 mmol/L (ref 135–145)

## 2017-01-28 LAB — PROTIME-INR
INR: 1.73
PROTHROMBIN TIME: 20.5 s — AB (ref 11.4–15.2)

## 2017-01-28 LAB — MAGNESIUM: MAGNESIUM: 1.7 mg/dL (ref 1.7–2.4)

## 2017-01-28 LAB — GLUCOSE, CAPILLARY
GLUCOSE-CAPILLARY: 141 mg/dL — AB (ref 65–99)
GLUCOSE-CAPILLARY: 160 mg/dL — AB (ref 65–99)
Glucose-Capillary: 63 mg/dL — ABNORMAL LOW (ref 65–99)
Glucose-Capillary: 72 mg/dL (ref 65–99)
Glucose-Capillary: 93 mg/dL (ref 65–99)
Glucose-Capillary: 177 mg/dL — ABNORMAL HIGH (ref 65–99)
Glucose-Capillary: 117 mg/dL — ABNORMAL HIGH (ref 65–99)

## 2017-01-28 LAB — CBC
HCT: 30.2 % — ABNORMAL LOW (ref 35.0–47.0)
Hemoglobin: 9.8 g/dL — ABNORMAL LOW (ref 12.0–16.0)
MCH: 24.3 pg — ABNORMAL LOW (ref 26.0–34.0)
MCHC: 32.4 g/dL (ref 32.0–36.0)
MCV: 75.2 fL — ABNORMAL LOW (ref 80.0–100.0)
PLATELETS: 233 10*3/uL (ref 150–440)
RBC: 4.02 MIL/uL (ref 3.80–5.20)
RDW: 18.5 % — ABNORMAL HIGH (ref 11.5–14.5)
WBC: 7.9 10*3/uL (ref 3.6–11.0)

## 2017-01-28 LAB — APTT: aPTT: 46 seconds — ABNORMAL HIGH (ref 24–36)

## 2017-01-28 LAB — IRON AND TIBC
IRON: 10 ug/dL — AB (ref 28–170)
Saturation Ratios: 3 % — ABNORMAL LOW (ref 10.4–31.8)
TIBC: 377 ug/dL (ref 250–450)
UIBC: 367 ug/dL

## 2017-01-28 LAB — TSH: TSH: 3.531 u[IU]/mL (ref 0.350–4.500)

## 2017-01-28 LAB — ACETAMINOPHEN LEVEL: Acetaminophen (Tylenol), Serum: <10 ug/mL — ABNORMAL LOW (ref 10–30)

## 2017-01-28 LAB — PHOSPHORUS: PHOSPHORUS: 2.9 mg/dL (ref 2.5–4.6)

## 2017-01-28 LAB — FERRITIN: FERRITIN: 24 ng/mL (ref 11–307)

## 2017-01-28 LAB — CK: Total CK: 283 U/L — ABNORMAL HIGH (ref 38–234)

## 2017-01-28 LAB — FOLATE: Folate: 12.6 ng/mL (ref >5.9)

## 2017-01-28 LAB — VITAMIN B12: Vitamin B-12: 360 pg/mL (ref 180–914)

## 2017-01-28 MED ORDER — SODIUM CHLORIDE 0.9 % IV SOLN
INTRAVENOUS | Status: DC
  Administered 2017-01-28: 04:00:00 via INTRAVENOUS

## 2017-01-28 MED ORDER — ALPRAZOLAM 0.5 MG PO TABS
0.2500 mg | ORAL_TABLET | Freq: Two times a day (BID) | ORAL | Status: DC
  Administered 2017-01-28 – 2017-01-29 (×4): 0.25 mg via ORAL
  Filled 2017-01-28 (×4): qty 1

## 2017-01-28 MED ORDER — NITROGLYCERIN 0.4 MG SL SUBL: 0.4000 mg | SUBLINGUAL_TABLET | SUBLINGUAL | Status: DC | PRN

## 2017-01-28 MED ORDER — BISACODYL 5 MG PO TBEC: 5.0000 mg | DELAYED_RELEASE_TABLET | Freq: Every day | ORAL | Status: DC | PRN

## 2017-01-28 MED ORDER — ACETAMINOPHEN 325 MG PO TABS: 650.0000 mg | ORAL_TABLET | Freq: Four times a day (QID) | ORAL | Status: DC | PRN

## 2017-01-28 MED ORDER — METHADONE HCL 10 MG PO TABS
20.0000 mg | ORAL_TABLET | Freq: Four times a day (QID) | ORAL | Status: DC
  Administered 2017-01-28 – 2017-01-29 (×6): 20 mg via ORAL
  Filled 2017-01-28 (×6): qty 2

## 2017-01-28 MED ORDER — ASPIRIN 325 MG PO TABS
325.0000 mg | ORAL_TABLET | Freq: Every day | ORAL | Status: DC
  Administered 2017-01-28: 325 mg via ORAL
  Filled 2017-01-28: qty 1

## 2017-01-28 MED ORDER — ALBUTEROL SULFATE (2.5 MG/3ML) 0.083% IN NEBU: 2.5000 mg | INHALATION_SOLUTION | Freq: Four times a day (QID) | RESPIRATORY_TRACT | Status: DC | PRN

## 2017-01-28 MED ORDER — ONDANSETRON HCL 4 MG PO TABS: 4.0000 mg | ORAL_TABLET | Freq: Four times a day (QID) | ORAL | Status: DC | PRN

## 2017-01-28 MED ORDER — GABAPENTIN 300 MG PO CAPS
300.0000 mg | ORAL_CAPSULE | Freq: Three times a day (TID) | ORAL | Status: DC
  Administered 2017-01-28 – 2017-01-29 (×4): 300 mg via ORAL
  Filled 2017-01-28 (×4): qty 1

## 2017-01-28 MED ORDER — SODIUM CHLORIDE 0.9 % IV SOLN
30.0000 meq | Freq: Once | INTRAVENOUS | Status: AC
  Administered 2017-01-28: 30 meq via INTRAVENOUS
  Filled 2017-01-28: qty 15

## 2017-01-28 MED ORDER — SENNOSIDES-DOCUSATE SODIUM 8.6-50 MG PO TABS: 1.0000 | ORAL_TABLET | Freq: Every evening | ORAL | Status: DC | PRN

## 2017-01-28 MED ORDER — ENALAPRIL MALEATE 10 MG PO TABS
20.0000 mg | ORAL_TABLET | Freq: Every day | ORAL | Status: DC
  Filled 2017-01-28: qty 2

## 2017-01-28 MED ORDER — POTASSIUM CHLORIDE CRYS ER 20 MEQ PO TBCR
20.0000 meq | EXTENDED_RELEASE_TABLET | ORAL | Status: AC
  Administered 2017-01-28 (×2): 20 meq via ORAL
  Filled 2017-01-28 (×2): qty 1

## 2017-01-28 MED ORDER — ACETAMINOPHEN 650 MG RE SUPP
650.0000 mg | Freq: Four times a day (QID) | RECTAL | Status: DC | PRN
Start: 2017-01-28 — End: 2017-01-29

## 2017-01-28 MED ORDER — TORSEMIDE 20 MG PO TABS
20.0000 mg | ORAL_TABLET | Freq: Two times a day (BID) | ORAL | Status: DC
  Administered 2017-01-28: 20 mg via ORAL
  Filled 2017-01-28: qty 1

## 2017-01-28 MED ORDER — HEPARIN SODIUM (PORCINE) 5000 UNIT/ML IJ SOLN
5000.0000 [IU] | Freq: Three times a day (TID) | INTRAMUSCULAR | Status: DC
  Administered 2017-01-28 – 2017-01-29 (×3): 5000 [IU] via SUBCUTANEOUS
  Filled 2017-01-28 (×3): qty 1

## 2017-01-28 MED ORDER — FLUDROCORTISONE ACETATE 0.1 MG PO TABS
0.1000 mg | ORAL_TABLET | Freq: Every day | ORAL | Status: DC
  Administered 2017-01-28 – 2017-01-29 (×2): 0.1 mg via ORAL
  Filled 2017-01-28 (×2): qty 1

## 2017-01-28 MED ORDER — METOLAZONE 2.5 MG PO TABS
2.5000 mg | ORAL_TABLET | Freq: Every day | ORAL | Status: DC | PRN
  Filled 2017-01-28: qty 1

## 2017-01-28 MED ORDER — MIRTAZAPINE 15 MG PO TABS
15.0000 mg | ORAL_TABLET | Freq: Every day | ORAL | Status: DC
  Administered 2017-01-28: 15 mg via ORAL
  Filled 2017-01-28: qty 1

## 2017-01-28 MED ORDER — ONDANSETRON HCL 4 MG/2ML IJ SOLN: 4.0000 mg | Freq: Four times a day (QID) | INTRAMUSCULAR | Status: DC | PRN

## 2017-01-28 MED ORDER — LORATADINE 10 MG PO TABS
10.0000 mg | ORAL_TABLET | Freq: Two times a day (BID) | ORAL | Status: DC
  Administered 2017-01-28 – 2017-01-29 (×4): 10 mg via ORAL
  Filled 2017-01-28 (×4): qty 1

## 2017-01-28 MED ORDER — VITAMIN D 1000 UNITS PO TABS
1000.0000 [IU] | ORAL_TABLET | Freq: Every day | ORAL | Status: DC
  Administered 2017-01-28 – 2017-01-29 (×2): 1000 [IU] via ORAL
  Filled 2017-01-28 (×2): qty 1

## 2017-01-28 MED ORDER — PANTOPRAZOLE SODIUM 40 MG PO TBEC
40.0000 mg | DELAYED_RELEASE_TABLET | Freq: Two times a day (BID) | ORAL | Status: DC
  Administered 2017-01-28 – 2017-01-29 (×4): 40 mg via ORAL
  Filled 2017-01-28 (×4): qty 1

## 2017-01-28 MED ORDER — INSULIN ASPART 100 UNIT/ML ~~LOC~~ SOLN
0.0000 [IU] | SUBCUTANEOUS | Status: DC
  Administered 2017-01-28: 3 [IU] via SUBCUTANEOUS
  Administered 2017-01-29: 5 [IU] via SUBCUTANEOUS
  Filled 2017-01-28: qty 3
  Filled 2017-01-28: qty 5

## 2017-01-28 MED ORDER — CALCITRIOL 0.25 MCG PO CAPS
0.2500 ug | ORAL_CAPSULE | Freq: Every day | ORAL | Status: DC
  Administered 2017-01-28 – 2017-01-29 (×2): 0.25 ug via ORAL
  Filled 2017-01-28 (×2): qty 1

## 2017-01-28 MED ORDER — VITAMIN K1 10 MG/ML IJ SOLN
10.0000 mg | Freq: Every day | INTRAMUSCULAR | Status: DC
  Administered 2017-01-28 – 2017-01-29 (×2): 10 mg via SUBCUTANEOUS
  Filled 2017-01-28 (×2): qty 1

## 2017-01-28 MED ORDER — INSULIN GLARGINE 100 UNIT/ML SOLOSTAR PEN: 30.0000 [IU] | PEN_INJECTOR | Freq: Every day | SUBCUTANEOUS | Status: DC

## 2017-01-28 MED ORDER — SODIUM CHLORIDE 0.9% FLUSH: 3.0000 mL | Freq: Two times a day (BID) | INTRAVENOUS | Status: DC

## 2017-01-28 MED ORDER — MAGNESIUM CITRATE PO SOLN
1.0000 | Freq: Once | ORAL | Status: DC | PRN
  Filled 2017-01-28: qty 296

## 2017-01-28 MED ORDER — METOPROLOL TARTRATE 25 MG PO TABS
12.5000 mg | ORAL_TABLET | Freq: Two times a day (BID) | ORAL | Status: DC
  Administered 2017-01-28: 12.5 mg via ORAL
  Filled 2017-01-28 (×3): qty 1

## 2017-01-28 MED ORDER — INSULIN GLARGINE 100 UNIT/ML ~~LOC~~ SOLN
30.0000 [IU] | Freq: Every day | SUBCUTANEOUS | Status: DC
  Administered 2017-01-28: 30 [IU] via SUBCUTANEOUS
  Filled 2017-01-28 (×2): qty 0.3

## 2017-01-28 MED ORDER — POTASSIUM CHLORIDE IN NACL 20-0.9 MEQ/L-% IV SOLN
INTRAVENOUS | Status: DC
Start: 2017-01-28 — End: 2017-01-29
  Administered 2017-01-28: 13:00:00 via INTRAVENOUS
  Filled 2017-01-28 (×2): qty 1000

## 2017-01-28 MED ORDER — MAGNESIUM SULFATE 2 GM/50ML IV SOLN
2.0000 g | Freq: Once | INTRAVENOUS | Status: AC
  Administered 2017-01-28: 2 g via INTRAVENOUS
  Filled 2017-01-28: qty 50

## 2017-01-28 MED ORDER — ALBUTEROL SULFATE HFA 108 (90 BASE) MCG/ACT IN AERS: 2.0000 | INHALATION_SPRAY | Freq: Four times a day (QID) | RESPIRATORY_TRACT | Status: DC | PRN

## 2017-01-28 MED ORDER — IPRATROPIUM BROMIDE 0.02 % IN SOLN: 0.5000 mg | Freq: Four times a day (QID) | RESPIRATORY_TRACT | Status: DC | PRN

## 2017-01-28 MED ORDER — POTASSIUM CHLORIDE CRYS ER 20 MEQ PO TBCR
40.0000 meq | EXTENDED_RELEASE_TABLET | Freq: Two times a day (BID) | ORAL | Status: AC
  Administered 2017-01-28 (×2): 40 meq via ORAL
  Filled 2017-01-28 (×2): qty 2

## 2017-01-28 NOTE — Consult Note (Signed)
Jonathon Bellows MD  [address] [place],  [address] Phone: [phone] Fax : [phone]  Consultation  Referring Provider:     Dr Posey Pronto  Primary Care Physician:  Viviana Simpler, MD Primary Gastroenterologist: None  Reason for Consultation:     Transaminitis   Date of Admission:  01/27/2017 Date of Consultation:  01/28/2017         HPI:   Lindsay Harmon is a 61 y.o. female presented to the hospital early this morning with weakness. On admission found to have elevated LFT's. On admission Hb 10.2, mcv 75 , Cr 3.11, low chloride and low potassium. AST 923, ALT 507 . INR 1.73. Repeat labs show ALT, AST have improved. AST/ALT were normal in 06/2016 . RUQ USG shows sludge in gall bladder ,CBD dilated to 10 mm with intra and extra hepatic biliary dilation, moderate hydronephrosis of the right kidney. Tbilirubin not elevated.    UA shows no blood. Diagnosed with right sided hydronephrosis.   Denies any new medications , says she was naseous the past 6 days and did not eat or drink anything . Now she has no nausea.   Past Medical History:  Diagnosis Date  . Allergy   . Arthritis   . CAD (coronary artery disease)   . Depression   . Esophageal stricture   . Familial hematuria   . GERD (gastroesophageal reflux disease)   . Hyperlipidemia   . Hypertension   . IBS (irritable bowel syndrome)   . Nephrolithiasis   . NIDDM (non-insulin dependent diabetes mellitus)    with neuropathy  . Obesity, unspecified   . Pulmonary hypertension   . PVD (peripheral vascular disease) ([place])     Past Surgical History:  Procedure Laterality Date  . ABDOMINAL HYSTERECTOMY    . CARPAL TUNNEL RELEASE    . CESAREAN SECTION    . RIGHT HEART CATHETERIZATION N/A 12/05/2014   Procedure: RIGHT HEART CATH;  Surgeon: Sinclair Grooms, MD;  Location: [facility] LAB;  Service: Cardiovascular;  Laterality: N/A;    Prior to Admission medications   Medication Sig Start Date End Date Taking? Authorizing Provider    albuterol (PROVENTIL HFA;VENTOLIN HFA) 108 (90 Base) MCG/ACT inhaler Inhale 2 puffs into the lungs every 6 (six) hours as needed for wheezing or shortness of breath. 02/07/16  Yes Juanito Doom, MD  ALPRAZolam Duanne Moron) 0.25 MG tablet Take 0.25 mg by mouth 2 (two) times daily.    Yes Historical Provider, MD  aspirin 325 MG tablet Take 325 mg by mouth at bedtime.    Yes Historical Provider, MD  calcitRIOL (ROCALTROL) 0.25 MCG capsule Take 0.25 mcg by mouth daily.   Yes Historical Provider, MD  cholecalciferol (VITAMIN D) 1000 units tablet Take 1,000 Units by mouth daily.   Yes Historical Provider, MD  enalapril (VASOTEC) 20 MG tablet Take 20 mg by mouth daily.   Yes Historical Provider, MD  fludrocortisone (FLORINEF) 0.1 MG tablet Take 0.1 mg by mouth daily.   Yes Historical Provider, MD  gabapentin (NEURONTIN) 300 MG capsule Pt takes one capsule in the morning, one at noon, and two at bedtime. 07/23/16  Yes Venia Carbon, MD  glipiZIDE (GLUCOTROL) 5 MG tablet Take 1 tablet (5 mg total) by mouth 2 (two) times daily before a meal. 07/23/16  Yes Venia Carbon, MD  Insulin Glargine (LANTUS SOLOSTAR) 100 UNIT/ML Solostar Pen Inject 30 Units into the skin daily. 01/27/17  Yes Tonia Ghent, MD  loratadine (CLARITIN) 10 MG  tablet Take 10 mg by mouth 2 (two) times daily.    Yes Historical Provider, MD  lovastatin (MEVACOR) 40 MG tablet Take 80 mg by mouth at bedtime.   Yes Historical Provider, MD  methadone (DOLOPHINE) 10 MG tablet Take 2 tablets (20 mg total) by mouth 4 (four) times daily. Fill in about 1 week 12/26/16  Yes Venia Carbon, MD  metolazone (ZAROXOLYN) 2.5 MG tablet Take 2.5 mg by mouth daily as needed (when pts weight reaches 157 or greater).    Yes Historical Provider, MD  metoprolol tartrate (LOPRESSOR) 25 MG tablet Take 0.5 tablets (12.5 mg total) by mouth 2 (two) times daily. 12/27/15  Yes Larey Dresser, MD  mirtazapine (REMERON) 30 MG tablet Take 15 mg by mouth at bedtime.   Yes  Historical Provider, MD  nitroGLYCERIN (NITROSTAT) 0.4 MG SL tablet Place 1 tablet (0.4 mg total) under the tongue every 5 (five) minutes as needed for chest pain. 07/23/16  Yes Venia Carbon, MD  pantoprazole (PROTONIX) 40 MG tablet Take 1 tablet (40 mg total) by mouth 2 (two) times daily. 01/06/17  Yes Venia Carbon, MD  Tiotropium Bromide-Olodaterol (STIOLTO RESPIMAT) 2.5-2.5 MCG/ACT AERS Inhale 2 puffs into the lungs daily. 09/26/16  Yes Juanito Doom, MD  torsemide (DEMADEX) 20 MG tablet Take 1 tablet (20 mg total) by mouth 2 (two) times daily. 07/24/16  Yes Venia Carbon, MD  ONE [facility] ULTRA TEST test strip USE 1 STRIP TO TEST BLOOD SUGAR ONCE DAILY 06/17/16   Venia Carbon, MD    Family History  Problem Relation Age of Onset  . Diabetes Mother   . Cancer Mother     ovarian,melanoma  . Allergies Father   . Cancer Father     lung  . Cancer Maternal Grandmother     uterine  . Emphysema Paternal Grandfather   . Allergies Sister   . Breast cancer Neg Hx      Social History  Substance Use Topics  . Smoking status: Former Smoker    Packs/day: 1.50    Years: 20.00    Types: Cigarettes    Quit date: 01/04/2009  . Smokeless tobacco: Never Used  . Alcohol use No     Comment: heavy in the past    Allergies as of 01/27/2017 - Review Complete 01/27/2017  Allergen Reaction Noted  . Sertraline hcl Other (See Comments) 05/29/2016    Review of Systems:    All systems reviewed and negative except where noted in HPI.   Physical Exam:  Vital signs in last 24 hours: Temp:  [97.7 F (36.5 C)-99.1 F (37.3 C)] 98.1 F (36.7 C) (04/03 0754) Pulse Rate:  [64-93] 72 (04/03 0754) Resp:  [7-20] 18 (04/03 0334) BP: (77-132)/(52-74) 96/54 (04/03 0754) SpO2:  [87 %-100 %] 95 % (04/03 0754) Weight:  [166 lb (75.3 kg)-166 lb 12 oz (75.6 kg)] 166 lb (75.3 kg) (04/02 1842) Last BM Date: 01/27/17 General:   Pleasant, cooperative in NAD Head:  Normocephalic and atraumatic. Eyes:    No icterus.   Conjunctiva pink. PERRLA. Ears:  Normal auditory acuity. Neck:  Supple; no masses or thyroidomegaly Lungs: Respirations even and unlabored. Lungs clear to auscultation bilaterally.   No wheezes, crackles, or rhonchi.  Heart:  Regular rate and rhythm;  Without murmur, clicks, rubs or gallops Abdomen:  Soft, nondistended, nontender. Normal bowel sounds. No appreciable masses or hepatomegaly.  No rebound or guarding.  Rectal:  Not performed. Neurologic:  Alert and  oriented x3;  grossly normal neurologically. Psych:  Alert and cooperative. Normal affect.  LAB RESULTS:  Recent Labs  01/27/17 1503 01/27/17 1850 01/28/17 0458  WBC 11.1* 10.5 7.9  HGB 10.2* 10.4* 9.8*  HCT 31.7* 32.0* 30.2*  PLT 280.0 261 233   BMET  Recent Labs  01/27/17 1503 01/27/17 1850 01/28/17 0458  NA 135 131* 139  K 2.7 Repeated and verified X2.* 2.6* 2.5*  CL 86* 85* 97*  CO2 38* 33* 34*  GLUCOSE 101* 187* 181*  BUN 46* 48* 43*  CREATININE 2.96* 3.11* 2.55*  CALCIUM 8.7 8.1* 7.5*   LFT  Recent Labs  01/28/17 0458  PROT 5.7*  ALBUMIN 2.6*  AST 446*  ALT 365*  ALKPHOS 47  BILITOT 1.1   PT/INR  Recent Labs  01/28/17 0458  LABPROT 20.5*  INR 1.73    STUDIES: US Abdomen Limited Ruq  Result Date: 01/27/2017 CLINICAL DATA:  Unspecified disorder of liver function, elevated liver function tests EXAM: US ABDOMEN LIMITED - RIGHT UPPER QUADRANT COMPARISON:  None. FINDINGS: Gallbladder: Tumefactive biliary sludge is seen within. Moderate gallbladder distention. Trace pericholecystic fluid. No sonographic Murphy sign noted by sonographer. Common bile duct: Diameter: Measures up to 10 mm in caliber without choledocholithiasis. Liver: No focal lesion identified.  Mild intrahepatic ductal dilatation. Note is made of moderate right-sided hydronephrosis. IMPRESSION: Moderate gallbladder distention containing tumefactive biliary sludge and trace pericholecystic fluid. Intra and extrahepatic  ductal dilatation. No definite evidence of choledocholithiasis. Biliary sludge within the bile ducts could potentially contribute to this appearance. Moderate hydronephrosis of visualized right kidney. Electronically Signed   By: Ashley Royalty M.D.   On: 01/27/2017 22:37      Impression / Plan:   Lindsay Harmon is a 61 y.o. y/o female admitted with acute on chronic renal failure , rt sided hydronephrosis and acute transaminitis . The pattern of elevated transaminases with rapid improvement is suggestive of ischemic hepatitis secondary to dehydration/intravascular volume depletion . Also noted are biliary dilation on USG with CBD of 10 mm and normal bilirubin. I expect his LFT's to continue to improve rapidly with rehydration . I also note incidental microcytic anemia.   Plan   1. Daily INR and LFT's  2. Vitamin K 10 mg S.C for 3 doses to treat any vitamin K deficiency as cause of high INR 3. Acute hepatitis panel, CK,TSH,tylenol levels . Will hold off on autoimmune panel for now unless LFT's do not continue to decline as it is already doing so  4. MRCP after renal function improves- since bilirubin is not elevated can wait till renal function improves and he can tolerate a contrast.  5. Rehydrate , address hydronephrosis  6. Doppler usg of the liver   7. Consider holding ACE inhibitors/diuretics till intravascular volume has been repleted.  8. Iron studies, b12,folate, ferritin, celiac serology and would likely need outpatient endoscopic evaluation if iron deficient. If iron deficiency consider IV iron   Thank you for involving me in the care of this patient.      LOS: 0 days   Jonathon Bellows, MD  01/28/2017, 8:42 AM

## 2017-01-28 NOTE — Progress Notes (Signed)
PT Cancellation Note  Patient Details Name: Lindsay Harmon MRN: [id number] DOB: 02-Dec-1955   Cancelled Treatment:    Reason Eval/Treat Not Completed: Medical issues which prohibited therapy; Pt's Ka 2.5 and trending down with current level outside guidelines for patient participation with PT services.  Will attempt to evaluate pt at a future date as medically appropriate.   [name] PT, DPT 01/28/17, 1:45 PM

## 2017-01-28 NOTE — Progress Notes (Signed)
[facility]  ROUNDING NOTE   Subjective:  Patient well known to [place] from the office. Recently she's been troubled with generalized weakness, decreased appetite, nausea. Labs showed severe hypokalemia with a serum potassium of 2.7 upon admission. Potassium is down to 2.5 at the moment. She has had widely fluctuating serum potassium as an outpatient. Originally the patient had hyperkalemia and was started on patiromer and then fludrocortisone. K then became lower and pt was taken off patiromer.   Now K very low.  Shes also on torsemide as an outpt, which was held this AM.   Objective:  Vital signs in last 24 hours:  Temp:  [97.7 F (36.5 C)-99.1 F (37.3 C)] 98.1 F (36.7 C) (04/03 0754) Pulse Rate:  [64-93] 72 (04/03 0754) Resp:  [7-20] 18 (04/03 0334) BP: (77-132)/(52-74) 96/54 (04/03 0754) SpO2:  [87 %-100 %] 95 % (04/03 0754) Weight:  [75.3 kg (166 lb)-75.6 kg (166 lb 12 oz)] 75.3 kg (166 lb) (04/02 1842)  Weight change:  Filed Weights   01/27/17 1842  Weight: 75.3 kg (166 lb)    Intake/Output: I/O last 3 completed shifts: In: 2062.1 [I.V.:1062.1; IV Piggyback:1000] Out: -    Intake/Output this shift:  No intake/output data recorded.  Physical Exam: General: No acute distress  Head: Normocephalic, atraumatic. Moist oral mucosal membranes  Eyes: Anicteric  Neck: Supple, trachea midline  Lungs:  Clear to auscultation, normal effort  Heart: S1S2 no rubs  Abdomen:  Soft, nontender,   Extremities: Trace peripheral edema.  Neurologic: Nonfocal, moving all four extremities  Skin: No lesions       Basic Metabolic Panel:  Recent Labs Lab 01/27/17 1503 01/27/17 1850 01/28/17 0458  NA 135 131* 139  K 2.7 Repeated and verified X2.* 2.6* 2.5*  CL 86* 85* 97*  CO2 38* 33* 34*  GLUCOSE 101* 187* 181*  BUN 46* 48* 43*  CREATININE 2.96* 3.11* 2.55*  CALCIUM 8.7 8.1* 7.5*  MG  --   --  1.7  PHOS  --   --  2.9    Liver Function Tests:  Recent  Labs Lab 01/27/17 1503 01/28/17 0458  AST 923* 446*  ALT 507* 365*  ALKPHOS 55 47  BILITOT 0.9 1.1  PROT 6.6 5.7*  ALBUMIN 3.4* 2.6*   No results for input(s): LIPASE, AMYLASE in the last 168 hours. No results for input(s): AMMONIA in the last 168 hours.  CBC:  Recent Labs Lab 01/27/17 1503 01/27/17 1850 01/28/17 0458  WBC 11.1* 10.5 7.9  NEUTROABS 8.5*  --   --   HGB 10.2* 10.4* 9.8*  HCT 31.7* 32.0* 30.2*  MCV 74.7* 75.3* 75.2*  PLT 280.0 261 233    Cardiac Enzymes: No results for input(s): CKTOTAL, CKMB, CKMBINDEX, TROPONINI in the last 168 hours.  BNP: Invalid input(s): POCBNP  CBG:  Recent Labs Lab 01/28/17 0243 01/28/17 0309 01/28/17 0355 01/28/17 0727 01/28/17 1121  GLUCAP 63* 72 79 177* 141*    Microbiology: Results for orders placed or performed during the hospital encounter of 06/02/15  MRSA PCR Screening     Status: None   Collection Time: 06/02/15 10:31 PM  Result Value Ref Range Status   MRSA by PCR NEGATIVE NEGATIVE Final    Comment:        The GeneXpert MRSA Assay (FDA approved for NASAL specimens only), is one component of a comprehensive MRSA colonization surveillance program. It is not intended to diagnose MRSA infection nor to guide or monitor treatment for MRSA  infections.     Coagulation Studies:  Recent Labs  01/28/17 0458  LABPROT 20.5*  INR 1.73    Urinalysis:  Recent Labs  01/27/17 2250  COLORURINE STRAW*  LABSPEC 1.004*  PHURINE 6.0  GLUCOSEU NEGATIVE  HGBUR SMALL*  BILIRUBINUR NEGATIVE  KETONESUR NEGATIVE  PROTEINUR NEGATIVE  NITRITE NEGATIVE  LEUKOCYTESUR NEGATIVE      Imaging: US Abdomen Limited Ruq  Result Date: 01/27/2017 CLINICAL DATA:  Unspecified disorder of liver function, elevated liver function tests EXAM: US ABDOMEN LIMITED - RIGHT UPPER QUADRANT COMPARISON:  None. FINDINGS: Gallbladder: Tumefactive biliary sludge is seen within. Moderate gallbladder distention. Trace pericholecystic  fluid. No sonographic Murphy sign noted by sonographer. Common bile duct: Diameter: Measures up to 10 mm in caliber without choledocholithiasis. Liver: No focal lesion identified.  Mild intrahepatic ductal dilatation. Note is made of moderate right-sided hydronephrosis. IMPRESSION: Moderate gallbladder distention containing tumefactive biliary sludge and trace pericholecystic fluid. Intra and extrahepatic ductal dilatation. No definite evidence of choledocholithiasis. Biliary sludge within the bile ducts could potentially contribute to this appearance. Moderate hydronephrosis of visualized right kidney. Electronically Signed   By: Ashley Royalty M.D.   On: 01/27/2017 22:37     Medications:   . 0.9 % NaCl with KCl 20 mEq / L     . ALPRAZolam  0.25 mg Oral BID  . aspirin  325 mg Oral QHS  . calcitRIOL  0.25 mcg Oral Daily  . cholecalciferol  1,000 Units Oral Daily  . enalapril  20 mg Oral Daily  . fludrocortisone  0.1 mg Oral Daily  . gabapentin  300 mg Oral TID  . heparin  5,000 Units Subcutaneous Q8H  . insulin aspart  0-15 Units Subcutaneous Q4H  . insulin glargine  30 Units Subcutaneous Daily  . loratadine  10 mg Oral BID  . magnesium sulfate 1 - 4 g bolus IVPB  2 g Intravenous Once  . methadone  20 mg Oral QID  . metoprolol tartrate  12.5 mg Oral BID  . mirtazapine  15 mg Oral QHS  . pantoprazole  40 mg Oral BID  . phytonadione  10 mg Subcutaneous Daily  . potassium chloride  40 mEq Oral BID  . sodium chloride flush  3 mL Intravenous Q12H   acetaminophen **OR** acetaminophen, albuterol, bisacodyl, ipratropium, magnesium citrate, metolazone, nitroGLYCERIN, ondansetron **OR** ondansetron (ZOFRAN) IV, senna-docusate  Assessment/ Plan:  61 y.o. Caucasian female with past medical history of diabetes mellitus type 2, hyperlipidemia, episodic mood disorder, coronary artery disease, peripheral vascular disease, GERD, degenerative joint disease, chronic back pain, peripheral neuropathy,  right-sided heart failure, chronic respiratory failure on home oxygen, pulmonary hypertension, chronic kidney disease stage IV   1.  Acute renal failure. 2.  CKD stage IV baseline EGFR 27 3.  Severe hypokalemia, torsemide stopped, still on fludrocortisone 4.  Anemia of CKD.  5.  Right sided hydronephrosis  Plan:  We suspect that she may have become significantly volume depleted as upon admission she was hyponatremic, hypokalemic, and relatively hypotensive.  We will hold torsemide at this time. Agree with IV fluid hydration. Patient is on oral potassium however we will administer potassium chloride 30 mEq IV 1 as potassium was still dropping. Serum magnesium is acceptable at 1.7. In addition hydronephrosis was noted in the right kidney. In the office we previously noted a right renal cyst. We will obtain CT scan of the abdomen and pelvis without contrast for further clarification.   LOS: 0 Lindsay Harmon 4/3/201812:03 PM

## 2017-01-28 NOTE — Plan of Care (Signed)
Problem: Bowel/Gastric: Goal: Will not experience complications related to bowel motility Outcome: Progressing Pt is progressing toward goals. Pt is aware of plan of care and cooperative.

## 2017-01-28 NOTE — Telephone Encounter (Signed)
Staff called pt, advised ER.

## 2017-01-28 NOTE — Progress Notes (Signed)
Dr. Posey Pronto in on rounds earlier, pt diet changed to renal/cho, pt tolerated lunch. This Probation officer told Dr. On rounds that am BP meds held due to systolic bp of <599. Pt is tolerating electrolyte replacement iv.

## 2017-01-28 NOTE — Progress Notes (Signed)
[place] for electrolyte replacement Indication: hypokalemia  Pharmacy consulted for electrolyte replacement for 61 yo female admitted with hypokalemia. Patient currently order NS/7mq at 527mhr. Patient ordered potassium 4075mPO BID x 2 doses, potassium 8m5mV x 1, and magnesium 2g IV x 1.   Plan:  Will check BMP at 1800 and adjust as warranted.   4/3 1816 K 3. Will give potassium chloride 20 mEq po Q2H x 2 doses and recheck all electrolytes with AM labs.    Allergies  Allergen Reactions  . Sertraline Hcl Other (See Comments)    Malaise    Patient Measurements: Height: _0  (157.5 cm) Weight: 166 lb (75.3 kg) IBW/kg (Calculated) : 50.1  Vital Signs:   Intake/Output from previous day: 04/02 0701 - 04/03 0700 In: 2062.1 [I.V.:1062.1; IV Piggyback:1000] Out: -  Intake/Output from this shift: No intake/output data recorded.  Labs:  Recent Labs  01/27/17 1503 01/27/17 1850 01/28/17 0458 01/28/17 1816  WBC 11.1* 10.5 7.9  --   HGB 10.2* 10.4* 9.8*  --   HCT 31.7* 32.0* 30.2*  --   PLT 280.0 261 233  --   APTT  --   --  46*  --   CREATININE 2.96* 3.11* 2.55* 2.09*  MG  --   --  1.7  --   PHOS  --   --  2.9  --   ALBUMIN 3.4*  --  2.6*  --   PROT 6.6  --  5.7*  --   AST 923*  --  446*  --   ALT 507*  --  365*  --   ALKPHOS 55  --  47  --   BILITOT 0.9  --  1.1  --    Estimated Creatinine Clearance: 26.2 mL/min (A) (by C-G formula based on SCr of 2.09 mg/dL (H)).   Microbiology: No results found for this or any previous visit (from the past 720 hour(s)).  Medical History: Past Medical History:  Diagnosis Date  . Allergy   . Arthritis   . CAD (coronary artery disease)   . Depression   . Esophageal stricture   . Familial hematuria   . GERD (gastroesophageal reflux disease)   . Hyperlipidemia   . Hypertension   . IBS (irritable bowel syndrome)   . Nephrolithiasis   . NIDDM (non-insulin dependent diabetes  mellitus)    with neuropathy  . Obesity, unspecified   . Pulmonary hypertension   . P[facility] (peripheral vascular disease) ([facility]  [facility] will continue to monitor and adjust per consult.   NathLaural Benesarm.D., BCPS Clinical Pharmacist 01/28/2017,8:10 PM

## 2017-01-28 NOTE — Progress Notes (Signed)
[place] [facility] [place] NAME: Lindsay Harmon    MR#:  [id number]  DATE OF BIRTH:  09-Dec-1955  SUBJECTIVE:  No new complaints  REVIEW OF SYSTEMS:   Review of Systems  Constitutional: Negative for chills, fever and weight loss.  HENT: Negative for ear discharge, ear pain and nosebleeds.   Eyes: Negative for blurred vision, pain and discharge.  Respiratory: Negative for sputum production, shortness of breath, wheezing and stridor.   Cardiovascular: Negative for chest pain, palpitations, orthopnea and PND.  Gastrointestinal: Negative for abdominal pain, diarrhea, nausea and vomiting.  Genitourinary: Negative for frequency and urgency.  Musculoskeletal: Negative for back pain and joint pain.  Neurological: Positive for weakness. Negative for sensory change, speech change and focal weakness.  Psychiatric/Behavioral: Negative for depression and hallucinations. The patient is not nervous/anxious.    Tolerating Diet: Tolerating PT:   DRUG ALLERGIES:   Allergies  Allergen Reactions  . Sertraline Hcl Other (See Comments)    Malaise    VITALS:  Blood pressure (!) 96/54, pulse 72, temperature 98.1 F (36.7 C), temperature source Oral, resp. rate 18, height _0  (1.575 m), weight 75.3 kg (166 lb), SpO2 95 %.  PHYSICAL EXAMINATION:   Physical Exam  GENERAL:  61 y.o.-year-old patient lying in the bed with no acute distress.  EYES: Pupils equal, round, reactive to light and accommodation. No scleral icterus. Extraocular muscles intact.  HEENT: Head atraumatic, normocephalic. Oropharynx and nasopharynx clear.  NECK:  Supple, no jugular venous distention. No thyroid enlargement, no tenderness.  LUNGS: Normal breath sounds bilaterally, no wheezing, rales, rhonchi. No use of accessory muscles of respiration.  CARDIOVASCULAR: S1, S2 normal. No murmurs, rubs, or gallops.  ABDOMEN: Soft, nontender, nondistended. Bowel sounds present. No  organomegaly or mass.  EXTREMITIES: No cyanosis, clubbing or edema b/l.    NEUROLOGIC: Cranial nerves II through XII are intact. No focal Motor or sensory deficits b/l.   PSYCHIATRIC:  patient is alert and oriented x 3.  SKIN: No obvious rash, lesion, or ulcer.   LABORATORY PANEL:  CBC  Recent Labs Lab 01/28/17 0458  WBC 7.9  HGB 9.8*  HCT 30.2*  PLT 233    Chemistries   Recent Labs Lab 01/28/17 0458  NA 139  K 2.5*  CL 97*  CO2 34*  GLUCOSE 181*  BUN 43*  CREATININE 2.55*  CALCIUM 7.5*  MG 1.7  AST 446*  ALT 365*  ALKPHOS 47  BILITOT 1.1   Cardiac Enzymes No results for input(s): TROPONINI in the last 168 hours. RADIOLOGY:  Ct Abdomen Pelvis Wo Contrast  Result Date: 01/28/2017 CLINICAL DATA:  Generalized weakness and decreased appetite. Acute renal failure. Symptoms for 6 days. EXAM: CT ABDOMEN AND PELVIS WITHOUT CONTRAST TECHNIQUE: Multidetector CT imaging of the abdomen and pelvis was performed following the standard protocol without IV contrast. COMPARISON:  Right upper quadrant ultrasound 01/27/2017. FINDINGS: Lower chest: Mild atelectasis is seen in the left lung base. Small Bochdalek's hernia on the left is identified. Heart size is upper normal. Punctate calcified granuloma right lower lobe is noted. No pleural or pericardial effusion. Hepatobiliary: There is subtle nodularity of the border of the left hepatic lobe worrisome for cirrhosis. The liver is low attenuating. No focal liver lesion is identified. The gallbladder is mildly distended without surrounding inflammatory change. No stone is seen. The biliary tree is unremarkable. Pancreas: Unremarkable. No pancreatic ductal dilatation or surrounding inflammatory changes. Spleen: Normal in size without focal abnormality. Adrenals/Urinary  Tract: The adrenal glands appear normal. There is no hydronephrosis on the right or left. Multiple vascular calcifications are seen the kidneys but no definite renal stone is  identified. The urinary bladder is distended but otherwise unremarkable. Ureters appear normal. Stomach/Bowel: Air and fluid collection the posterior wall of the fourth portion of the duodenum measures proximally and 1.8 cm transverse by 1.9 cm craniocaudal by 1.3 cm AP and is likely a diverticulum. The small bowel otherwise appears normal. The stomach and colon are normal in appearance. No evidence of appendicitis is seen. Vascular/Lymphatic: Extensive aortoiliac atherosclerosis without aneurysm is identified. No lymphadenopathy. Reproductive: Status post hysterectomy. No adnexal masses. Other: No fluid collection or hernia. Musculoskeletal: Convex right lumbar scoliosis and marked multilevel degenerative disc disease are seen. IMPRESSION: Fatty infiltration with a nodular border worrisome for cirrhosis. Extensive atherosclerosis. Duodenal diverticulum. Electronically Signed   By: Inge Rise M.D.   On: 01/28/2017 12:58   [place] Art/ven Flow Abd Pelv Doppler  Result Date: 01/28/2017 CLINICAL DATA:  Abnormal LFTs, evidence of fatty liver and cirrhosis by CT EXAM: DUPLEX ULTRASOUND OF LIVER TECHNIQUE: Color and duplex Doppler ultrasound was performed to evaluate the hepatic in-flow and out-flow vessels. COMPARISON:  01/28/2017 FINDINGS: Portal Vein Velocities Main:  26 cm/sec Right:  20 cm/sec Left:  17 cm/sec Hepatic Vein Velocities Right:  23 cm/sec Middle:  27 cm/sec Left:  34 cm/sec Hepatic Artery Velocity:  62 cm/sec Splenic Vein Velocity:  13 cm/sec Varices: Not visualized Ascites: None. Patent portal, hepatic, and splenic veins with normal directional flow. Negative for portal vein occlusion or thrombus. Liver demonstrates increased echogenicity and surface nodularity of the left hepatic lobe compatible with fatty infiltration and developing early cirrhosis. Spleen is normal in size measuring 9.5 x 10.4 x 3.2 cm. IMPRESSION: Normal liver Doppler evaluation. Evidence of hepatic steatosis and early  cirrhosis. Electronically Signed   By: Jerilynn Mages.  Shick M.D.   On: 01/28/2017 15:04   US Abdomen Limited Ruq  Result Date: 01/27/2017 CLINICAL DATA:  Unspecified disorder of liver function, elevated liver function tests EXAM: US ABDOMEN LIMITED - RIGHT UPPER QUADRANT COMPARISON:  None. FINDINGS: Gallbladder: Tumefactive biliary sludge is seen within. Moderate gallbladder distention. Trace pericholecystic fluid. No sonographic Murphy sign noted by sonographer. Common bile duct: Diameter: Measures up to 10 mm in caliber without choledocholithiasis. Liver: No focal lesion identified.  Mild intrahepatic ductal dilatation. Note is made of moderate right-sided hydronephrosis. IMPRESSION: Moderate gallbladder distention containing tumefactive biliary sludge and trace pericholecystic fluid. Intra and extrahepatic ductal dilatation. No definite evidence of choledocholithiasis. Biliary sludge within the bile ducts could potentially contribute to this appearance. Moderate hydronephrosis of visualized right kidney. Electronically Signed   By: Ashley Royalty M.D.   On: 01/27/2017 22:37   ASSESSMENT AND PLAN:   61 y.o. female with a history of coronary artery disease, osteoarthritis, GERD, hypertension, hyperlipidemia, diabetes, pulmonary hypertension, peripheral vascular disease, depression  now being admitted with:  #. Hypokalemia -Replace by mouth and IV -2.6--2.5  #. Transaminitis -Check hepatitis panel -Hold hepatotoxic medications: Lovastatin -General surgery consultation appreciated  #. Acute on chronic kidney disease IV with right-sided hydronephrosis -Nephrology consultation -Continue calcitriol, vitamin D, Florinef -CT abdomen/pelvis  #. History of hypertension - Continue metoprolol, enalapril  #. History of coronary artery disease - Continue aspirin, nitroglycerin  #. History of GERD - Continue Protonix  #. History of CHF - Continue torsemide, metolazone, Lopressor, enalapril  #. H/o  Diabetes - Accuchecks every 4 hours with RISS coverage - Continue  Lantus, hold glipizide  Case discussed with Care Management/Social Worker. Management plans discussed with the patient, family and they are in agreement.  CODE STATUS: full DVT Prophylaxis: heparin  TOTAL TIME TAKING CARE OF THIS PATIENT: 30 minutes.  >50% time spent on counselling and coordination of care  POSSIBLE D/C IN  1-2 DAYS, DEPENDING ON CLINICAL CONDITION.  Note: This dictation was prepared with Dragon dictation along with smaller phrase technology. Any transcriptional errors that result from this process are unintentional.  Kamilah Correia M.D on 01/28/2017 at 3:54 PM  Between 7am to 6pm - Pager - [phone]  After 6pm go to [email or web address] - password [email or web address] [facility]  Office  [phone]  CC: Primary care physician; Viviana Simpler, MD

## 2017-01-28 NOTE — ED Notes (Signed)
Pt treated for CBG =  61 w/ 4 oz orange juice and a pack of graham crackers w/ peanut butter.

## 2017-01-28 NOTE — ED Notes (Signed)
Olivia Mackie, RN called at this time to report CBG of 71 after PO treatment. Pt transferred to floor.

## 2017-01-28 NOTE — H&P (Signed)
History and Physical   [facility] - [place] @ [facility] Admission History and Physical [employer], D.O.    Patient Name: Lindsay Harmon MR#: [id number] Date of Birth: 07-10-1956 Date of Admission: 01/27/2017  Referring MD/NP/PA: Dr. Kerman Passey Primary Care Physician: Viviana Simpler, MD Patient coming from: Home Outpatient Specialists: Dr. Holley Raring   Chief Complaint:  Chief Complaint  Patient presents with  . Abnormal Lab    HPI: Lindsay Harmon is a 62 y.o. female with a known history of Coronary artery disease, osteoarthritis, GERD, hypertension, hyperlipidemia, diabetes, pulmonary hypertension, peripheral vascular disease, depression presents to the emergency department for evaluation of weakness.  Patient was in a usual state of health until 2 weeks ago when she describes general weakness with decreased appetite, nausea. Her primary care doctor did some labs and reported to the emergency department today for potassium of 2.7. She was also found to have acute on chronic renal insufficiency as well as elevated LFTs..  Patient denies fevers/chills, dizziness, chest pain, shortness of breath, N/V/C/D, abdominal pain, dysuria/frequency, changes in mental status.    Otherwise there has been no change in status. Patient has been taking medication as prescribed and there has been no recent change in medication or diet.  No recent antibiotics.  There has been no recent illness, hospitalizations, travel or sick contacts.    Review of Systems:  CONSTITUTIONAL: Positive fatigue and weakness No fever/chills, weight gain/loss, headache. EYES: No blurry or double vision. ENT: No tinnitus, postnasal drip, redness or soreness of the oropharynx. RESPIRATORY: No cough, dyspnea, wheeze.  No hemoptysis.  CARDIOVASCULAR: No chest pain, palpitations, syncope, orthopnea. No lower extremity edema.  GASTROINTESTINAL: No nausea, vomiting, abdominal pain, diarrhea, constipation.  No hematemesis, melena  or hematochezia. GENITOURINARY: No dysuria, frequency, hematuria. ENDOCRINE: No polyuria or nocturia. No heat or cold intolerance. HEMATOLOGY: No anemia, bruising, bleeding. INTEGUMENTARY: No rashes, ulcers, lesions. MUSCULOSKELETAL: No arthritis, gout, dyspnea. NEUROLOGIC: No numbness, tingling, ataxia, seizure-type activity, weakness. PSYCHIATRIC: No anxiety, depression, insomnia.   Past Medical History:  Diagnosis Date  . Allergy   . Arthritis   . CAD (coronary artery disease)   . Depression   . Esophageal stricture   . Familial hematuria   . GERD (gastroesophageal reflux disease)   . Hyperlipidemia   . Hypertension   . IBS (irritable bowel syndrome)   . Nephrolithiasis   . NIDDM (non-insulin dependent diabetes mellitus)    with neuropathy  . Obesity, unspecified   . Pulmonary hypertension   . PVD (peripheral vascular disease) ([place])     Past Surgical History:  Procedure Laterality Date  . ABDOMINAL HYSTERECTOMY    . CARPAL TUNNEL RELEASE    . CESAREAN SECTION    . RIGHT HEART CATHETERIZATION N/A 12/05/2014   Procedure: RIGHT HEART CATH;  Surgeon: Sinclair Grooms, MD;  Location: [facility] CATH LAB;  Service: Cardiovascular;  Laterality: N/A;     reports that she quit smoking about 8 years ago. Her smoking use included Cigarettes. She has a 30.00 pack-year smoking history. She has never used smokeless tobacco. She reports that she does not drink alcohol or use drugs.  Allergies  Allergen Reactions  . Sertraline Hcl Other (See Comments)    Malaise    Family History  Problem Relation Age of Onset  . Diabetes Mother   . Cancer Mother     ovarian,melanoma  . Allergies Father   . Cancer Father     lung  . Cancer Maternal Grandmother  uterine  . Emphysema Paternal Grandfather   . Allergies Sister   . Breast cancer Neg Hx     Prior to Admission medications   Medication Sig Start Date End Date Taking? Authorizing Provider  albuterol (PROVENTIL HFA;VENTOLIN HFA)  108 (90 Base) MCG/ACT inhaler Inhale 2 puffs into the lungs every 6 (six) hours as needed for wheezing or shortness of breath. 02/07/16  Yes Juanito Doom, MD  ALPRAZolam Duanne Moron) 0.25 MG tablet Take 0.25 mg by mouth 2 (two) times daily.    Yes Historical Provider, MD  aspirin 325 MG tablet Take 325 mg by mouth at bedtime.    Yes Historical Provider, MD  calcitRIOL (ROCALTROL) 0.25 MCG capsule Take 0.25 mcg by mouth daily.   Yes Historical Provider, MD  cholecalciferol (VITAMIN D) 1000 units tablet Take 1,000 Units by mouth daily.   Yes Historical Provider, MD  enalapril (VASOTEC) 20 MG tablet Take 20 mg by mouth daily.   Yes Historical Provider, MD  fludrocortisone (FLORINEF) 0.1 MG tablet Take 0.1 mg by mouth daily.   Yes Historical Provider, MD  gabapentin (NEURONTIN) 300 MG capsule Pt takes one capsule in the morning, one at noon, and two at bedtime. 07/23/16  Yes Venia Carbon, MD  glipiZIDE (GLUCOTROL) 5 MG tablet Take 1 tablet (5 mg total) by mouth 2 (two) times daily before a meal. 07/23/16  Yes Venia Carbon, MD  Insulin Glargine (LANTUS SOLOSTAR) 100 UNIT/ML Solostar Pen Inject 30 Units into the skin daily. 01/27/17  Yes Tonia Ghent, MD  loratadine (CLARITIN) 10 MG tablet Take 10 mg by mouth 2 (two) times daily.    Yes Historical Provider, MD  lovastatin (MEVACOR) 40 MG tablet Take 80 mg by mouth at bedtime.   Yes Historical Provider, MD  methadone (DOLOPHINE) 10 MG tablet Take 2 tablets (20 mg total) by mouth 4 (four) times daily. Fill in about 1 week 12/26/16  Yes Venia Carbon, MD  metolazone (ZAROXOLYN) 2.5 MG tablet Take 2.5 mg by mouth daily as needed (when pts weight reaches 157 or greater).    Yes Historical Provider, MD  metoprolol tartrate (LOPRESSOR) 25 MG tablet Take 0.5 tablets (12.5 mg total) by mouth 2 (two) times daily. 12/27/15  Yes Larey Dresser, MD  mirtazapine (REMERON) 30 MG tablet Take 15 mg by mouth at bedtime.   Yes Historical Provider, MD  nitroGLYCERIN  (NITROSTAT) 0.4 MG SL tablet Place 1 tablet (0.4 mg total) under the tongue every 5 (five) minutes as needed for chest pain. 07/23/16  Yes Venia Carbon, MD  pantoprazole (PROTONIX) 40 MG tablet Take 1 tablet (40 mg total) by mouth 2 (two) times daily. 01/06/17  Yes Venia Carbon, MD  Tiotropium Bromide-Olodaterol (STIOLTO RESPIMAT) 2.5-2.5 MCG/ACT AERS Inhale 2 puffs into the lungs daily. 09/26/16  Yes Juanito Doom, MD  torsemide (DEMADEX) 20 MG tablet Take 1 tablet (20 mg total) by mouth 2 (two) times daily. 07/24/16  Yes Venia Carbon, MD  ONE [facility] ULTRA TEST test strip USE 1 STRIP TO TEST BLOOD SUGAR ONCE DAILY 06/17/16   Venia Carbon, MD    Physical Exam: Vitals:   01/27/17 1842 01/27/17 1844 01/27/17 2100 01/27/17 2228  BP:  (!) 109/52 109/62 103/65  Pulse:  93 74 75  Resp:  _0 Temp:  99.1 F (37.3 C)    TempSrc:  Oral    SpO2:  90% 97% 100%  Weight: 75.3 kg (166 lb)  Height: _0  (1.575 m)       GENERAL: 61 y.o.-year-old White female patient, well-developed, well-nourished lying in the bed in no acute distress.  Pleasant and cooperative.   HEENT: Head atraumatic, normocephalic. Pupils equal, round, reactive to light and accommodation. No scleral icterus. Extraocular muscles intact. Nares are patent. Oropharynx is clear. Mucus membranes moist. NECK: Supple, full range of motion. No JVD, no bruit heard. No thyroid enlargement, no tenderness, no cervical lymphadenopathy. CHEST: Normal breath sounds bilaterally. No wheezing, rales, rhonchi or crackles. No use of accessory muscles of respiration.  No reproducible chest wall tenderness.  CARDIOVASCULAR: S1, S2 normal. No murmurs, rubs, or gallops. Cap refill <2 seconds. Pulses intact distally.  ABDOMEN: Soft, nondistended, nontender. No rebound, guarding, rigidity. Normoactive bowel sounds present in all four quadrants. No organomegaly or mass. EXTREMITIES: No pedal edema, cyanosis, or clubbing. No calf  tenderness or Homan's sign.  NEUROLOGIC: The patient is alert and oriented x 3. Cranial nerves II through XII are grossly intact with no focal sensorimotor deficit. Muscle strength 5/5 in all extremities. Sensation intact. Gait not checked. PSYCHIATRIC:  Normal affect, mood, thought content. SKIN: Warm, dry, and intact without obvious rash, lesion, or ulcer.    Labs on Admission:  CBC:  Recent Labs Lab 01/27/17 1503 01/27/17 1850  WBC 11.1* 10.5  NEUTROABS 8.5*  --   HGB 10.2* 10.4*  HCT 31.7* 32.0*  MCV 74.7* 75.3*  PLT 280.0 147   Basic Metabolic Panel:  Recent Labs Lab 01/27/17 1503 01/27/17 1850  NA 135 131*  K 2.7 Repeated and verified X2.* 2.6*  CL 86* 85*  CO2 38* 33*  GLUCOSE 101* 187*  BUN 46* 48*  CREATININE 2.96* 3.11*  CALCIUM 8.7 8.1*   GFR: Estimated Creatinine Clearance: 17.6 mL/min (A) (by C-G formula based on SCr of 3.11 mg/dL (H)). Liver Function Tests:  Recent Labs Lab 01/27/17 1503  AST 923*  ALT 507*  ALKPHOS 55  BILITOT 0.9  PROT 6.6  ALBUMIN 3.4*   No results for input(s): LIPASE, AMYLASE in the last 168 hours. No results for input(s): AMMONIA in the last 168 hours. Coagulation Profile: No results for input(s): INR, PROTIME in the last 168 hours. Cardiac Enzymes: No results for input(s): CKTOTAL, CKMB, CKMBINDEX, TROPONINI in the last 168 hours. BNP (last 3 results) No results for input(s): PROBNP in the last 8760 hours. HbA1C:  Recent Labs  01/27/17 1503  HGBA1C 8.7*   CBG:  Recent Labs Lab 01/27/17 1854  GLUCAP 196*   Lipid Profile: No results for input(s): CHOL, HDL, LDLCALC, TRIG, CHOLHDL, LDLDIRECT in the last 72 hours. Thyroid Function Tests: No results for input(s): TSH, T4TOTAL, FREET4, T3FREE, THYROIDAB in the last 72 hours. Anemia Panel: No results for input(s): VITAMINB12, FOLATE, FERRITIN, TIBC, IRON, RETICCTPCT in the last 72 hours. Urine analysis:    Component Value Date/Time   COLORURINE STRAW (A)  01/27/2017 2250   APPEARANCEUR CLEAR (A) 01/27/2017 2250   LABSPEC 1.004 (L) 01/27/2017 2250   PHURINE 6.0 01/27/2017 2250   GLUCOSEU NEGATIVE 01/27/2017 2250   HGBUR SMALL (A) 01/27/2017 2250   BILIRUBINUR NEGATIVE 01/27/2017 2250   KETONESUR NEGATIVE 01/27/2017 2250   PROTEINUR NEGATIVE 01/27/2017 2250   UROBILINOGEN 1.0 12/04/2014 0108   NITRITE NEGATIVE 01/27/2017 2250   LEUKOCYTESUR NEGATIVE 01/27/2017 2250   Sepsis Labs: _1 (procalcitonin:4,lacticidven:4) )No results found for this or any previous visit (from the past 240 hour(s)).   Radiological Exams on Admission: US Abdomen Limited Ruq  Result  Date: 01/27/2017 CLINICAL DATA:  Unspecified disorder of liver function, elevated liver function tests EXAM: US ABDOMEN LIMITED - RIGHT UPPER QUADRANT COMPARISON:  None. FINDINGS: Gallbladder: Tumefactive biliary sludge is seen within. Moderate gallbladder distention. Trace pericholecystic fluid. No sonographic Murphy sign noted by sonographer. Common bile duct: Diameter: Measures up to 10 mm in caliber without choledocholithiasis. Liver: No focal lesion identified.  Mild intrahepatic ductal dilatation. Note is made of moderate right-sided hydronephrosis. IMPRESSION: Moderate gallbladder distention containing tumefactive biliary sludge and trace pericholecystic fluid. Intra and extrahepatic ductal dilatation. No definite evidence of choledocholithiasis. Biliary sludge within the bile ducts could potentially contribute to this appearance. Moderate hydronephrosis of visualized right kidney. Electronically Signed   By: Ashley Royalty M.D.   On: 01/27/2017 22:37    EKG: Normal sinus rhythm at 89 bpm with normal axis and nonspecific ST-T wave changes.   Assessment/Plan  This is a 61 y.o. female with a history of coronary artery disease, osteoarthritis, GERD, hypertension, hyperlipidemia, diabetes, pulmonary hypertension, peripheral vascular disease, depression  now being admitted with:  #.  Hypokalemia - Admit to inpatient, telemetry monitoring -Replace by mouth and IV -Repeat BMP in a.m.  #. Transaminitis -Check hepatitis panel -Hold hepatotoxic medications: Lovastatin -General surgery consultation appreciated -GI consult -Nothing by mouth  #. Acute on chronic kidney disease with right-sided hydronephrosis -Gentle IV fluid hydration -Nephrology consultation -Consider urology -Continue calcitriol, vitamin D, Florinef  #. History of hypertension - Continue metoprolol, enalapril  #. History of coronary artery disease - Continue aspirin, nitroglycerin  #. History of GERD - Continue Protonix  #. History of CHF - Continue torsemide, metolazone, Lopressor, enalapril  #. H/o Diabetes - Accuchecks every 4 hours with RISS coverage - Continue Lantus, hold glipizide  Admission status: Inpatient IV Fluids: Normal saline Diet/Nutrition: Nothing by mouth Consults called: Nephrology  DVT Px: Heparin SCDs and early ambulation. Code Status: Full Code  Disposition Plan: To home in 1-2 days  All the records are reviewed and case discussed with ED provider. Management plans discussed with the patient and/or family who express understanding and agree with plan of care.  Stewart Sasaki D.O. on 01/28/2017 at 12:31 AM Between 7am to 6pm - Pager - [phone] After 6pm go to [email or web address] - [employer] [facility] [place] [facility] Office [phone] CC: Primary care physician; Viviana Simpler, MD   01/28/2017, 12:31 AM

## 2017-01-28 NOTE — Progress Notes (Signed)
Dr. Benjie Karvonen ordered for pharmacy to do electrolytes on pt. After potassium came back as 2.5

## 2017-01-28 NOTE — Progress Notes (Signed)
MEDICATION RELATED CONSULT NOTE - INITIAL   Pharmacy Consult for electrolyte replacement Indication: hypokalemia  Allergies  Allergen Reactions  . Sertraline Hcl Other (See Comments)    Malaise    Patient Measurements: Height: _0  (157.5 cm) Weight: 166 lb (75.3 kg) IBW/kg (Calculated) : 50.1 Adjusted Body Weight: n/a  Vital Signs: Temp: 97.7 F (36.5 C) (04/03 0334) Temp Source: Oral (04/03 0334) BP: 98/62 (04/03 0334) Pulse Rate: 74 (04/03 0334) Intake/Output from previous day: 04/02 0701 - 04/03 0700 In: 2062.1 [I.V.:1062.1; IV Piggyback:1000] Out: -  Intake/Output from this shift: No intake/output data recorded.  Labs:  Recent Labs  01/27/17 1503 01/27/17 1850 01/28/17 0458  WBC 11.1* 10.5 7.9  HGB 10.2* 10.4* 9.8*  HCT 31.7* 32.0* 30.2*  PLT 280.0 261 233  APTT  --   --  46*  CREATININE 2.96* 3.11* 2.55*  MG  --   --  1.7  PHOS  --   --  2.9  ALBUMIN 3.4*  --  2.6*  PROT 6.6  --  5.7*  AST 923*  --  446*  ALT 507*  --  365*  ALKPHOS 55  --  47  BILITOT 0.9  --  1.1   Estimated Creatinine Clearance: 21.5 mL/min (A) (by C-G formula based on SCr of 2.55 mg/dL (H)).   Microbiology: No results found for this or any previous visit (from the past 720 hour(s)).  Medical History: Past Medical History:  Diagnosis Date  . Allergy   . Arthritis   . CAD (coronary artery disease)   . Depression   . Esophageal stricture   . Familial hematuria   . GERD (gastroesophageal reflux disease)   . Hyperlipidemia   . Hypertension   . IBS (irritable bowel syndrome)   . Nephrolithiasis   . NIDDM (non-insulin dependent diabetes mellitus)    with neuropathy  . Obesity, unspecified   . Pulmonary hypertension   . PVD (peripheral vascular disease) ([facility])     Medications:    Assessment: K+ 2.5.  Goal of Therapy:  K+ WNL   Plan:  KCl 40 mEq PO x 2 doses ordered. Recheck BMP with tomorrow AM labs.  Reyhan Moronta S 01/28/2017,7:07 AM

## 2017-01-28 NOTE — Progress Notes (Signed)
Shift assessment completed. Pt is easily awakened, in no distress. o2 on at [facility], respirations are shallow and unlabored, lungs are decreased. Telebox in place, SR noted, abdomen is soft, bs heard. Pt has scd's in place,, no edema noted, ppp. PIV #20 intact to L wrist area with ns infusing at 30ms/hr,site is free of redness and swelling. Pt stated that she was in the "same amount of pain as usual" when asked. Discussed npo status with pt and plan for GI and Nephrology to consult regarding her hypokalemia. SRx2, call bell in reach.

## 2017-01-28 NOTE — Progress Notes (Addendum)
Inpatient Diabetes Program Recommendations  AACE/ADA: New Consensus Statement on Inpatient Glycemic Control (2015)  Target Ranges:  Prepandial:   less than 140 mg/dL      Peak postprandial:   less than 180 mg/dL (1-2 hours)      Critically ill patients:  140 - 180 mg/dL   Lab Results  Component Value Date   GLUCAP 177 (H) 01/28/2017   HGBA1C 8.7 (H) 01/27/2017    Review of Glycemic Control  Results for PENNIE, VANBLARCOM (MRN [id number]) as of 01/28/2017 08:53  Ref. Range 01/27/2017 18:54 01/28/2017 02:43 01/28/2017 03:09 01/28/2017 03:55 01/28/2017 07:27  Glucose-Capillary Latest Ref Range: 65 - 99 mg/dL 196 (H) 63 (L) 72 93 177 (H)    Diabetes history: Type 2 Outpatient Diabetes medications: Glucotrol 16m bid (6am and 6pm), Lantus 30 units at 6am (confirmed with patient) Current orders for Inpatient glycemic control: Lantus 30 units qday, Novolog 0-15 units q4h  Inpatient Diabetes Program Recommendations:   Spoke with patient at the bedside.  She confirms she takes 30 units of Lantus qday - never skips "I have for a long time".  She checks blood sugars qam and it "is usually just above 1041mdl.  Encouraged patient to check sugars sporadically 2 hours after meals or at hs to determine elevations (A1C 8.7%)- discussed the A1C and how it is an average over 3 months- there must be a time of the day that the blood sugars become elevated.   Patient acknowledged understanding.   Please consider insulin changes- regardless if the patient is eating;    Consider decreasing Novolog correction to sensitive correction 0-9 units q4h (low CBG yesterday and poor renal function).  Consider stopping Lantus insulin for now. When CBG are consistently above 18059ml , consider adding a partial dose of patient's home Lantus dose, consider starting with Lantus 15 units qam    JulGentry FitzN, BA,[place] Diabetes Coordinator Inpatient Diabetes Program  [phone] Pager) [phone]/12/2016 9:42 AM

## 2017-01-28 NOTE — Progress Notes (Signed)
[place] for electrolyte replacement Indication: hypokalemia  Pharmacy consulted for electrolyte replacement for 60 yo female admitted with hypokalemia. Patient currently order NS/69mq at 567mhr. Patient ordered potassium 4073mPO BID x 2 doses, potassium 39m34mV x 1, and magnesium 2g IV x 1.   Plan:  Will check BMP at 1800 and adjust as warranted.    Allergies  Allergen Reactions  . Sertraline Hcl Other (See Comments)    Malaise    Patient Measurements: Height: 5' 2" (157.5 cm) Weight: 166 lb (75.3 kg) IBW/kg (Calculated) : 50.1  Vital Signs: Temp: 98.1 F (36.7 C) (04/03 0754) Temp Source: Oral (04/03 0754) BP: 96/54 (04/03 0754) Pulse Rate: 72 (04/03 0754) Intake/Output from previous day: 04/02 0701 - 04/03 0700 In: 2062.1 [I.V.:1062.1; IV Piggyback:1000] Out: -  Intake/Output from this shift: Total I/O In: 50 [IV Piggyback:50] Out: -   Labs:  Recent Labs  01/27/17 1503 01/27/17 1850 01/28/17 0458  WBC 11.1* 10.5 7.9  HGB 10.2* 10.4* 9.8*  HCT 31.7* 32.0* 30.2*  PLT 280.0 261 233  APTT  --   --  46*  CREATININE 2.96* 3.11* 2.55*  MG  --   --  1.7  PHOS  --   --  2.9  ALBUMIN 3.4*  --  2.6*  PROT 6.6  --  5.7*  AST 923*  --  446*  ALT 507*  --  365*  ALKPHOS 55  --  47  BILITOT 0.9  --  1.1   Estimated Creatinine Clearance: 21.5 mL/min (A) (by C-G formula based on SCr of 2.55 mg/dL (H)).   Microbiology: No results found for this or any previous visit (from the past 720 hour(s)).  Medical History: Past Medical History:  Diagnosis Date  . Allergy   . Arthritis   . CAD (coronary artery disease)   . Depression   . Esophageal stricture   . Familial hematuria   . GERD (gastroesophageal reflux disease)   . Hyperlipidemia   . Hypertension   . IBS (irritable bowel syndrome)   . Nephrolithiasis   . NIDDM (non-insulin dependent diabetes mellitus)    with neuropathy  . Obesity, unspecified   .  Pulmonary hypertension   . [facility]  [facility]armacy will continue to monitor and adjust per consult.   Dawanna Grauberger L 01/28/2017,4:34 PM

## 2017-01-29 LAB — HEPATITIS B E ANTIBODY: HEP B E AB: NEGATIVE

## 2017-01-29 LAB — HEPATIC FUNCTION PANEL
ALK PHOS: 46 U/L (ref 38–126)
ALT: 307 U/L — ABNORMAL HIGH (ref 14–54)
AST: 254 U/L — ABNORMAL HIGH (ref 15–41)
Albumin: 2.7 g/dL — ABNORMAL LOW (ref 3.5–5.0)
BILIRUBIN DIRECT: 0.2 mg/dL (ref 0.1–0.5)
BILIRUBIN INDIRECT: 0.7 mg/dL (ref 0.3–0.9)
TOTAL PROTEIN: 5.9 g/dL — AB (ref 6.5–8.1)
Total Bilirubin: 0.9 mg/dL (ref 0.3–1.2)

## 2017-01-29 LAB — BASIC METABOLIC PANEL
ANION GAP: 7 (ref 5–15)
BUN: 33 mg/dL — AB (ref 6–20)
CHLORIDE: 102 mmol/L (ref 101–111)
CO2: 33 mmol/L — ABNORMAL HIGH (ref 22–32)
Calcium: 8 mg/dL — ABNORMAL LOW (ref 8.9–10.3)
Creatinine, Ser: 2.09 mg/dL — ABNORMAL HIGH (ref 0.44–1.00)
GFR calc Af Amer: 28 mL/min — ABNORMAL LOW (ref >60)
GFR calc non Af Amer: 24 mL/min — ABNORMAL LOW (ref >60)
GLUCOSE: 81 mg/dL (ref 65–99)
Potassium: 3.7 mmol/L (ref 3.5–5.1)
Sodium: 142 mmol/L (ref 135–145)

## 2017-01-29 LAB — GLUCOSE, CAPILLARY
GLUCOSE-CAPILLARY: 102 mg/dL — AB (ref 65–99)
GLUCOSE-CAPILLARY: 220 mg/dL — AB (ref 65–99)
GLUCOSE-CAPILLARY: 63 mg/dL — AB (ref 65–99)
GLUCOSE-CAPILLARY: 75 mg/dL (ref 65–99)

## 2017-01-29 LAB — HEPATITIS B DNA, ULTRAQUANTITATIVE, PCR
HBV DNA SERPL PCR-ACNC: NOT DETECTED [IU]/mL
HBV DNA SERPL PCR-LOG IU: UNDETERMINED log10IU/mL

## 2017-01-29 LAB — HEPATITIS B E ANTIGEN: HEP B E AG: NEGATIVE

## 2017-01-29 LAB — MAGNESIUM: Magnesium: 2 mg/dL (ref 1.7–2.4)

## 2017-01-29 LAB — PHOSPHORUS: Phosphorus: 2.5 mg/dL (ref 2.5–4.6)

## 2017-01-29 LAB — HEPATITIS PANEL, ACUTE
HEP B S AG: NEGATIVE
Hep A IgM: NEGATIVE
Hep B C IgM: NEGATIVE

## 2017-01-29 LAB — HIV ANTIBODY (ROUTINE TESTING W REFLEX): HIV SCREEN 4TH GENERATION: NONREACTIVE

## 2017-01-29 LAB — HEPATITIS B CORE ANTIBODY, IGM: Hep B C IgM: NEGATIVE

## 2017-01-29 MED ORDER — ENALAPRIL MALEATE 5 MG PO TABS: 5.0000 mg | ORAL_TABLET | Freq: Every day | ORAL | 0 refills | Status: DC

## 2017-01-29 MED ORDER — ENALAPRIL MALEATE 5 MG PO TABS: 5.0000 mg | ORAL_TABLET | Freq: Every day | ORAL | Status: DC

## 2017-01-29 MED ORDER — TORSEMIDE 20 MG PO TABS: 20.0000 mg | ORAL_TABLET | Freq: Every day | ORAL | 0 refills | Status: DC

## 2017-01-29 MED ORDER — INSULIN GLARGINE 100 UNIT/ML ~~LOC~~ SOLN
15.0000 [IU] | Freq: Every day | SUBCUTANEOUS | Status: DC
  Administered 2017-01-29: 15 [IU] via SUBCUTANEOUS
  Filled 2017-01-29: qty 0.15

## 2017-01-29 NOTE — Care Management Note (Signed)
Case Management Note  Patient Details  Name: Lindsay Harmon MRN: [id number] Date of Birth: September 25, 1956  Subjective/Objective:   Spoke with patient regarding PT recommendations. Offered choice of home health agencies and what to expect. Patient refused any home health at this time. RNCM provided patient with her contact information in the event she changed her mind.                  Action/Plan:   Expected Discharge Date:  01/30/17               Expected Discharge Plan:  Home/Self Care  In-House Referral:     Discharge planning Services  CM Consult  Post Acute Care Choice:  Home Health Choice offered to:  Patient  DME Arranged:    DME Agency:     HH Arranged:  PT, Patient Refused [place] Agency:     Status of Service:  Completed, signed off  If discussed at [employer] of Stay Meetings, dates discussed:    Additional Comments:  Jolly Mango, RN 01/29/2017, 2:03 PM

## 2017-01-29 NOTE — Evaluation (Signed)
Physical Therapy Evaluation Patient Details Name: Lindsay Harmon MRN: [id number] DOB: 1956-03-15 Today's Date: 01/29/2017   History of Present Illness  Pt admitted for hypokalemia along with acute transaminitis. Pt complaints of weakness and nausea. History includes CAD, OA, GERD, HTN, DM, PVD, and pulm hypertension. Potassium is now corrected and pt cleared for participation.  Clinical Impression  Pt is a pleasant 61 year old female who was admitted for hypokalemia along with acute transaminitis. Pt performs bed mobility with cga, transfers with independence, and ambulation with supervision with use of IV pole. Ambulation performed without AD as per her baseline and pt with unsteadiness and short step length. Pt needs B hand support on AD for fall prevention and improved balance. Recommend using rollater at home to improve endurance. O2 sats decrease with exertion, down to 84%, however return back to 91% on 3L of O2 with seated rest. Pt demonstrates deficits with strength/balance/endurance/mobility. Would benefit from skilled PT to address above deficits and promote optimal return to PLOF.Marland Kitchen Recommend transition to [place] upon discharge from acute hospitalization.       Follow Up Recommendations Home health PT    Equipment Recommendations  None recommended by PT (has all equipment at home)    Recommendations for Other Services       Precautions / Restrictions Precautions Precautions: Fall Restrictions Weight Bearing Restrictions: No      Mobility  Bed Mobility Overal bed mobility: Needs Assistance Bed Mobility: Supine to Sit     Supine to sit: Min guard     General bed mobility comments: assist for scooting out towards EOB. Once seated at EOB, safe technique performed  Transfers Overall transfer level: Independent Equipment used: None             General transfer comment: transfer performed with safe technique, once standing used IV pole for assistance. Forward flexed  posture noted  Ambulation/Gait Ambulation/Gait assistance: Supervision Ambulation Distance (Feet): 200 Feet Assistive device:  (IV pole) Gait Pattern/deviations: Step-through pattern     General Gait Details: ambulated using reciprocal gait pattern and holding with B UE onto IV pole. O2 at 3L of O2 with sats decreasing to 84% with exertion. Pt cued for pursed lip breathing with ability to improve sats to 88%. Improved balance noted while using IV pole  Stairs            Wheelchair Mobility    Modified Rankin (Stroke Patients Only)       Balance Overall balance assessment: Needs assistance Sitting-balance support: Feet supported Sitting balance-Leahy Scale: Normal     Standing balance support: Bilateral upper extremity supported Standing balance-Leahy Scale: Good                               Pertinent Vitals/Pain Pain Assessment: No/denies pain    Home Living Family/patient expects to be discharged to:: Private residence Living Arrangements: Alone   Type of Home: House Home Access: Ramped entrance     Home Layout: One level Home Equipment: Environmental consultant - 4 wheels      Prior Function Level of Independence: Independent         Comments: uses O2 at baseline, 2L at rest and 3L with exertion     Hand Dominance        Extremity/Trunk Assessment   Upper Extremity Assessment Upper Extremity Assessment: Overall WFL for tasks assessed    Lower Extremity Assessment Lower Extremity Assessment: Generalized weakness (B LE  grossly 4/5)       Communication   Communication: No difficulties  Cognition Arousal/Alertness: Awake/alert Behavior During Therapy: WFL for tasks assessed/performed Overall Cognitive Status: Within Functional Limits for tasks assessed                                        General Comments      Exercises Other Exercises Other Exercises: Pt ambulated in hallway approx 10' without IV pole, with very slow  unsteady gait. Short step to gait pattern noted.   Assessment/Plan    PT Assessment Patient needs continued PT services  PT Problem List Decreased strength;Decreased activity tolerance;Decreased balance;Decreased mobility;Cardiopulmonary status limiting activity       PT Treatment Interventions Gait training;DME instruction;Therapeutic exercise    PT Goals (Current goals can be found in the Care Plan section)  Acute Rehab PT Goals Patient Stated Goal: to get stronger PT Goal Formulation: With patient Time For Goal Achievement: 02/12/17 Potential to Achieve Goals: Good    Frequency Min 2X/week   Barriers to discharge        Co-evaluation               End of Session Equipment Utilized During Treatment: Gait belt;Oxygen Activity Tolerance: Patient tolerated treatment well Patient left: in chair;with chair alarm set Nurse Communication: Mobility status PT Visit Diagnosis: Muscle weakness (generalized) (M62.81);Unsteadiness on feet (R26.81);Difficulty in walking, not elsewhere classified (R26.2)    Time: 6122-4497 PT Time Calculation (min) (ACUTE ONLY): 14 min   Charges:   PT Evaluation $PT Eval Low Complexity: 1 Procedure PT Treatments $Gait Training: 8-22 mins   PT G Codes:        Greggory Stallion, PT, DPT [phone]   Asbury Hair 01/29/2017, 10:56 AM

## 2017-01-29 NOTE — Progress Notes (Signed)
While rounding the unit [employer] [facility] visited with the Pt. Pt was sitting in the chair near the window. Pt's mother was bedside. Pt stated she was doing much better today and was waiting for discharge. Pt mentioned that her mother was taking care of her aunty who has cancer and requested prayers for herself, her mother, and aunt. CH provided prayer for healing, support, and safety as the Pt returns home. Pt and mother were very grateful for the Chaplain's visit.    01/29/17 1500  Clinical Encounter Type  Visited With Patient;Family  Visit Type Initial  Referral From Chaplain  Consult/Referral To [place];Other (Comment)

## 2017-01-29 NOTE — Discharge Summary (Signed)
[facility] NAME: Lindsay Harmon    MR#:  [id number]  DATE OF BIRTH:  [birth year]  DATE OF ADMISSION:  01/27/2017 ADMITTING PHYSICIAN: Harvie Bridge, DO  DATE OF DISCHARGE: 01/29/17 PRIMARY CARE PHYSICIAN: Viviana Simpler, MD    ADMISSION DIAGNOSIS:  Hypokalemia [E87.6] Nausea [R11.0] Weakness [R53.1] Acute on chronic renal insufficiency [N28.9, N18.9]  DISCHARGE DIAGNOSIS:  Hypokalemia due to overdiuresis-resolved Acute transaminitis improving, suspected due to dehydration  SECONDARY DIAGNOSIS:   Past Medical History:  Diagnosis Date  . Allergy   . Arthritis   . CAD (coronary artery disease)   . Depression   . Esophageal stricture   . Familial hematuria   . GERD (gastroesophageal reflux disease)   . Hyperlipidemia   . Hypertension   . IBS (irritable bowel syndrome)   . Nephrolithiasis   . NIDDM (non-insulin dependent diabetes mellitus)    with neuropathy  . Obesity, unspecified   . Pulmonary hypertension   . PVD (peripheral vascular disease) ([place])     HOSPITAL COURSE:   61 y.o.femalewith a history of coronary artery disease, osteoarthritis, GERD, hypertension, hyperlipidemia, diabetes, pulmonary hypertension, peripheral vascular disease, depressionnow being admitted with:  #. Hypokalemia due to overdiuresis -Replace by mouth and IV -2.6--2.5--3.7 -torsemide decreased to 20 mg qd and d/ced metolazone  #. Acute Transaminitis - hepatitis panel negative -Hold hepatotoxic medications: Lovastatin -GI consultation appreciated. Pattern appeared to be due to dehydration/demand ischemia and improved LFT's with hydration -f/u GI as out pt  #. Acute on chronic kidney diseaseIV with right-sided hydronephrosis -Nephrology consultation -Continue calcitriol, vitamin D, Florinef -CT abdomen/pelvis noted. Nothing acute  #. History of hypertension - Continue metoprolol, enalapril (dose decreased)  #.  History of coronary artery disease - Continue aspirin, nitroglycerin  #. History of GERD - Continue Protonix  #. History of CHF - Continue torsemide (20 mg daily ), Lopressor, enalapril (5 mg daily)  #. H/o Diabetes - Accuchecks every 4 hourswith RISS coverage - Continue Lantus,d/ced glipizide  PT recommends HHPT. Pt refuses it  D/c home CONSULTS OBTAINED:  Treatment Team:  Anthonette Legato, MD Jonathon Bellows, MD  DRUG ALLERGIES:   Allergies  Allergen Reactions  . Sertraline Hcl Other (See Comments)    Malaise    DISCHARGE MEDICATIONS:   Current Discharge Medication List    CONTINUE these medications which have CHANGED   Details  enalapril (VASOTEC) 5 MG tablet Take 1 tablet (5 mg total) by mouth daily. Qty: 30 tablet, Refills: 0    torsemide (DEMADEX) 20 MG tablet Take 1 tablet (20 mg total) by mouth daily. Qty: 1 tablet, Refills: 0      CONTINUE these medications which have NOT CHANGED   Details  albuterol (PROVENTIL HFA;VENTOLIN HFA) 108 (90 Base) MCG/ACT inhaler Inhale 2 puffs into the lungs every 6 (six) hours as needed for wheezing or shortness of breath. Qty: 1 Inhaler, Refills: 2    ALPRAZolam (XANAX) 0.25 MG tablet Take 0.25 mg by mouth 2 (two) times daily.     aspirin 325 MG tablet Take 325 mg by mouth at bedtime.     calcitRIOL (ROCALTROL) 0.25 MCG capsule Take 0.25 mcg by mouth daily.    cholecalciferol (VITAMIN D) 1000 units tablet Take 1,000 Units by mouth daily.    fludrocortisone (FLORINEF) 0.1 MG tablet Take 0.1 mg by mouth daily.    gabapentin (NEURONTIN) 300 MG capsule Pt takes one capsule in the morning, one at noon, and two at bedtime.  Qty: 360 capsule, Refills: 3    Insulin Glargine (LANTUS SOLOSTAR) 100 UNIT/ML Solostar Pen Inject 30 Units into the skin daily.    loratadine (CLARITIN) 10 MG tablet Take 10 mg by mouth 2 (two) times daily.     lovastatin (MEVACOR) 40 MG tablet Take 80 mg by mouth at bedtime.    methadone (DOLOPHINE)  10 MG tablet Take 2 tablets (20 mg total) by mouth 4 (four) times daily. Fill in about 1 week Qty: 240 tablet, Refills: 0    metoprolol tartrate (LOPRESSOR) 25 MG tablet Take 0.5 tablets (12.5 mg total) by mouth 2 (two) times daily. Qty: 90 tablet, Refills: 3    mirtazapine (REMERON) 30 MG tablet Take 15 mg by mouth at bedtime.    nitroGLYCERIN (NITROSTAT) 0.4 MG SL tablet Place 1 tablet (0.4 mg total) under the tongue every 5 (five) minutes as needed for chest pain. Qty: 100 tablet, Refills: 1    pantoprazole (PROTONIX) 40 MG tablet Take 1 tablet (40 mg total) by mouth 2 (two) times daily. Qty: 60 tablet, Refills: 0    Tiotropium Bromide-Olodaterol (STIOLTO RESPIMAT) 2.5-2.5 MCG/ACT AERS Inhale 2 puffs into the lungs daily. Qty: 4 g, Refills: 5    ONE TOUCH ULTRA TEST test strip USE 1 STRIP TO TEST BLOOD SUGAR ONCE DAILY Qty: 100 each, Refills: 4      STOP taking these medications     glipiZIDE (GLUCOTROL) 5 MG tablet      metolazone (ZAROXOLYN) 2.5 MG tablet         If you experience worsening of your admission symptoms, develop shortness of breath, life threatening emergency, suicidal or homicidal thoughts you must seek medical attention immediately by calling 911 or calling your MD immediately  if symptoms less severe.  You Must read complete instructions/literature along with all the possible adverse reactions/side effects for all the Medicines you take and that have been prescribed to you. Take any new Medicines after you have completely understood and accept all the possible adverse reactions/side effects.   Please note  You were cared for by a hospitalist during your hospital stay. If you have any questions about your discharge medications or the care you received while you were in the hospital after you are discharged, you can call the unit and asked to speak with the hospitalist on call if the hospitalist that took care of you is not available. Once you are discharged,  your primary care physician will handle any further medical issues. Please note that NO REFILLS for any discharge medications will be authorized once you are discharged, as it is imperative that you return to your primary care physician (or establish a relationship with a primary care physician if you do not have one) for your aftercare needs so that they can reassess your need for medications and monitor your lab values. Today   SUBJECTIVE   Doing well  VITAL SIGNS:  Blood pressure (!) 95/53, pulse (!) 101, temperature 98.7 F (37.1 C), temperature source Oral, resp. rate 16, height _0  (1.575 m), weight 75.3 kg (166 lb), SpO2 92 %.  I/O:   Intake/Output Summary (Last 24 hours) at 01/29/17 1417 Last data filed at 01/29/17 1048  Gross per 24 hour  Intake           678.33 ml  Output                0 ml  Net  678.33 ml    PHYSICAL EXAMINATION:  GENERAL:  61 y.o.-year-old patient lying in the bed with no acute distress.  EYES: Pupils equal, round, reactive to light and accommodation. No scleral icterus. Extraocular muscles intact.  HEENT: Head atraumatic, normocephalic. Oropharynx and nasopharynx clear.  NECK:  Supple, no jugular venous distention. No thyroid enlargement, no tenderness.  LUNGS: Normal breath sounds bilaterally, no wheezing, rales,rhonchi or crepitation. No use of accessory muscles of respiration.  CARDIOVASCULAR: S1, S2 normal. No murmurs, rubs, or gallops.  ABDOMEN: Soft, non-tender, non-distended. Bowel sounds present. No organomegaly or mass.  EXTREMITIES: ++ pedal edema,  no cyanosis, or clubbing.  NEUROLOGIC: Cranial nerves II through XII are intact. Muscle strength 5/5 in all extremities. Sensation intact. Gait not checked.  PSYCHIATRIC: The patient is alert and oriented x 3.  SKIN: No obvious rash, lesion, or ulcer.   DATA REVIEW:   CBC   Recent Labs Lab 01/28/17 0458  WBC 7.9  HGB 9.8*  HCT 30.2*  PLT 233    Chemistries   Recent  Labs Lab 01/29/17 0324  NA 142  K 3.7  CL 102  CO2 33*  GLUCOSE 81  BUN 33*  CREATININE 2.09*  CALCIUM 8.0*  MG 2.0  AST 254*  ALT 307*  ALKPHOS 46  BILITOT 0.9    Microbiology Results   No results found for this or any previous visit (from the past 240 hour(s)).  RADIOLOGY:  Ct Abdomen Pelvis Wo Contrast  Result Date: 01/28/2017 CLINICAL DATA:  Generalized weakness and decreased appetite. Acute renal failure. Symptoms for 6 days. EXAM: CT ABDOMEN AND PELVIS WITHOUT CONTRAST TECHNIQUE: Multidetector CT imaging of the abdomen and pelvis was performed following the standard protocol without IV contrast. COMPARISON:  Right upper quadrant ultrasound 01/27/2017. FINDINGS: Lower chest: Mild atelectasis is seen in the left lung base. Small Bochdalek's hernia on the left is identified. Heart size is upper normal. Punctate calcified granuloma right lower lobe is noted. No pleural or pericardial effusion. Hepatobiliary: There is subtle nodularity of the border of the left hepatic lobe worrisome for cirrhosis. The liver is low attenuating. No focal liver lesion is identified. The gallbladder is mildly distended without surrounding inflammatory change. No stone is seen. The biliary tree is unremarkable. Pancreas: Unremarkable. No pancreatic ductal dilatation or surrounding inflammatory changes. Spleen: Normal in size without focal abnormality. Adrenals/Urinary Tract: The adrenal glands appear normal. There is no hydronephrosis on the right or left. Multiple vascular calcifications are seen the kidneys but no definite renal stone is identified. The urinary bladder is distended but otherwise unremarkable. Ureters appear normal. Stomach/Bowel: Air and fluid collection the posterior wall of the fourth portion of the duodenum measures proximally and 1.8 cm transverse by 1.9 cm craniocaudal by 1.3 cm AP and is likely a diverticulum. The small bowel otherwise appears normal. The stomach and colon are normal in  appearance. No evidence of appendicitis is seen. Vascular/Lymphatic: Extensive aortoiliac atherosclerosis without aneurysm is identified. No lymphadenopathy. Reproductive: Status post hysterectomy. No adnexal masses. Other: No fluid collection or hernia. Musculoskeletal: Convex right lumbar scoliosis and marked multilevel degenerative disc disease are seen. IMPRESSION: Fatty infiltration with a nodular border worrisome for cirrhosis. Extensive atherosclerosis. Duodenal diverticulum. Electronically Signed   By: Inge Rise M.D.   On: 01/28/2017 12:58   [place] Art/ven Flow Abd Pelv Doppler  Result Date: 01/28/2017 CLINICAL DATA:  Abnormal LFTs, evidence of fatty liver and cirrhosis by CT EXAM: DUPLEX ULTRASOUND OF LIVER TECHNIQUE: Color and duplex Doppler ultrasound  was performed to evaluate the hepatic in-flow and out-flow vessels. COMPARISON:  01/28/2017 FINDINGS: Portal Vein Velocities Main:  26 cm/sec Right:  20 cm/sec Left:  17 cm/sec Hepatic Vein Velocities Right:  23 cm/sec Middle:  27 cm/sec Left:  34 cm/sec Hepatic Artery Velocity:  62 cm/sec Splenic Vein Velocity:  13 cm/sec Varices: Not visualized Ascites: None. Patent portal, hepatic, and splenic veins with normal directional flow. Negative for portal vein occlusion or thrombus. Liver demonstrates increased echogenicity and surface nodularity of the left hepatic lobe compatible with fatty infiltration and developing early cirrhosis. Spleen is normal in size measuring 9.5 x 10.4 x 3.2 cm. IMPRESSION: Normal liver Doppler evaluation. Evidence of hepatic steatosis and early cirrhosis. Electronically Signed   By: Jerilynn Mages.  Shick M.D.   On: 01/28/2017 15:04   US Abdomen Limited Ruq  Result Date: 01/27/2017 CLINICAL DATA:  Unspecified disorder of liver function, elevated liver function tests EXAM: US ABDOMEN LIMITED - RIGHT UPPER QUADRANT COMPARISON:  None. FINDINGS: Gallbladder: Tumefactive biliary sludge is seen within. Moderate gallbladder distention. Trace  pericholecystic fluid. No sonographic Murphy sign noted by sonographer. Common bile duct: Diameter: Measures up to 10 mm in caliber without choledocholithiasis. Liver: No focal lesion identified.  Mild intrahepatic ductal dilatation. Note is made of moderate right-sided hydronephrosis. IMPRESSION: Moderate gallbladder distention containing tumefactive biliary sludge and trace pericholecystic fluid. Intra and extrahepatic ductal dilatation. No definite evidence of choledocholithiasis. Biliary sludge within the bile ducts could potentially contribute to this appearance. Moderate hydronephrosis of visualized right kidney. Electronically Signed   By: Ashley Royalty M.D.   On: 01/27/2017 22:37     Management plans discussed with the patient, family and they are in agreement.  CODE STATUS:     Code Status Orders        Start     Ordered   01/28/17 0318  Full code  Continuous     01/28/17 0317    Code Status History    Date Active Date Inactive Code Status Order ID Comments User Context   10/19/2015  2:11 PM 10/20/2015  2:18 PM Full Code [id number]  Harrie Foreman, MD Inpatient   06/02/2015  9:43 PM 06/09/2015  8:52 PM Full Code [id number]  Rise Patience, MD Inpatient   12/05/2014  7:57 PM 12/07/2014  6:46 PM Full Code [id number]  Belva Crome, MD Inpatient   12/03/2014  3:00 PM 12/05/2014  7:57 PM Full Code [id number]  Thurnell Lose, MD ED      TOTAL TIME TAKING CARE OF THIS PATIENT: 40 minutes.    Malena Timpone M.D on 01/29/2017 at 2:17 PM  Between 7am to 6pm - Pager - [phone] After 6pm go to [email or web address] - password [email or web address] [facility]  Office  [phone]  CC: Primary care physician; Viviana Simpler, MD

## 2017-01-29 NOTE — Plan of Care (Signed)
Problem: Bowel/Gastric: Goal: Will not experience complications related to bowel motility Outcome: Adequate for Discharge Pt has met goals for discharge.

## 2017-01-29 NOTE — Progress Notes (Signed)
[place] for electrolyte replacement Indication: hypokalemia  Pharmacy consulted for electrolyte replacement for 61 yo female admitted with hypokalemia.   Plan:  Electrolytes are WNL. Will f/u AM labs.   Allergies  Allergen Reactions  . Sertraline Hcl Other (See Comments)    Malaise    Patient Measurements: Height: _0  (157.5 cm) Weight: 166 lb (75.3 kg) IBW/kg (Calculated) : 50.1  Vital Signs: Temp: 98.7 F (37.1 C) (04/04 0952) Temp Source: Oral (04/04 0952) BP: 95/53 (04/04 6122) Pulse Rate: 101 (04/04 0952) Intake/Output from previous day: 04/03 0701 - 04/04 0700 In: 488.3 [I.V.:438.3; IV Piggyback:50] Out: -  Intake/Output from this shift: Total I/O In: 240 [P.O.:240] Out: -   Labs:  Recent Labs  01/27/17 1503 01/27/17 1850 01/28/17 0458 01/28/17 1816 01/29/17 0324  WBC 11.1* 10.5 7.9  --   --   HGB 10.2* 10.4* 9.8*  --   --   HCT 31.7* 32.0* 30.2*  --   --   PLT 280.0 261 233  --   --   APTT  --   --  46*  --   --   CREATININE 2.96* 3.11* 2.55* 2.09* 2.09*  MG  --   --  1.7  --  2.0  PHOS  --   --  2.9  --  2.5  ALBUMIN 3.4*  --  2.6*  --  2.7*  PROT 6.6  --  5.7*  --  5.9*  AST 923*  --  446*  --  254*  ALT 507*  --  365*  --  307*  ALKPHOS 55  --  47  --  46  BILITOT 0.9  --  1.1  --  0.9  BILIDIR  --   --   --   --  0.2  IBILI  --   --   --   --  0.7   Estimated Creatinine Clearance: 26.2 mL/min (A) (by C-G formula based on SCr of 2.09 mg/dL (H)).   Potassium  Date Value Ref Range Status  01/29/2017 3.7 3.5 - 5.1 mmol/L Final   Calcium  Date Value Ref Range Status  01/29/2017 8.0 (L) 8.9 - 10.3 mg/dL Final      Microbiology: No results found for this or any previous visit (from the past 720 hour(s)).  Medical History: Past Medical History:  Diagnosis Date  . Allergy   . Arthritis   . CAD (coronary artery disease)   . Depression   . Esophageal stricture   . Familial hematuria   .  GERD (gastroesophageal reflux disease)   . Hyperlipidemia   . Hypertension   . IBS (irritable bowel syndrome)   . Nephrolithiasis   . NIDDM (non-insulin dependent diabetes mellitus)    with neuropathy  . Obesity, unspecified   . Pulmonary hypertension   . PVD (peripheral vascular disease) [facility])     Pharmacy will continue to monitor and adjust per consult.   Ulice Dash D, Pharm.D., BCPS Clinical Pharmacist 01/29/2017,1:10 PM

## 2017-01-29 NOTE — Progress Notes (Signed)
Inpatient Diabetes Program Recommendations  AACE/ADA: New Consensus Statement on Inpatient Glycemic Control (2015)  Target Ranges:  Prepandial:   less than 140 mg/dL      Peak postprandial:   less than 180 mg/dL (1-2 hours)      Critically ill patients:  140 - 180 mg/dL   Results for Lindsay Harmon, Lindsay Harmon (MRN [id number]) as of 01/29/2017 09:21  Ref. Range 01/28/2017 02:43 01/28/2017 03:09 01/28/2017 03:55 01/28/2017 07:27 01/28/2017 11:21 01/28/2017 16:18 01/28/2017 20:58  Glucose-Capillary Latest Ref Range: 65 - 99 mg/dL 63 (L) 72 93 177 (H) 141 (H) 117 (H) 160 (H)   Results for Lindsay Harmon, Lindsay Harmon (MRN [id number]) as of 01/29/2017 09:21  Ref. Range 01/29/2017 00:56 01/29/2017 05:19 01/29/2017 07:28  Glucose-Capillary Latest Ref Range: 65 - 99 mg/dL 102 (H) 75 63 (L)    Outpatient Diabetes meds: Glucotrol 8m bid (6am and 6pm)             Lantus 30 units at 6am (confirmed with patient)  Current Insulin Orders: Lantus 30 units daily      Novolog 0-15 units Q4h       MD- Please consider the following in-hospital insulin adjustments:  1. Decrease Novolog correction scale SSI to Sensitive scale (0-9 units) Q4h (low CBGs and poor renal function)  2. Decrease Lantus to 15 units daily (50% total home dose) (patient with Hypoglycemia this AM)    --Will follow patient during hospitalization--  JWyn QuakerRN, MSN, CDE Diabetes Coordinator Inpatient Glycemic Control Team Team Pager: [phone](8a-5p)

## 2017-01-29 NOTE — Progress Notes (Signed)
[facility]  ROUNDING NOTE   Subjective:  Patient seen at bedside. She is feeling much better. Potassium up to 3.7. Creatinine down to 2.0.   Objective:  Vital signs in last 24 hours:  Temp:  [98 F (36.7 C)-98.7 F (37.1 C)] 98.7 F (37.1 C) (04/04 0952) Pulse Rate:  [86-101] 101 (04/04 0952) Resp:  [16] 16 (04/04 0952) BP: (95-107)/(51-53) 95/53 (04/04 0952) SpO2:  [92 %-97 %] 92 % (04/04 0952)  Weight change:  Filed Weights   01/27/17 1842  Weight: 75.3 kg (166 lb)    Intake/Output: I/O last 3 completed shifts: In: 2550.4 [I.V.:1500.4; IV Piggyback:1050] Out: -    Intake/Output this shift:  Total I/O In: 240 [P.O.:240] Out: -   Physical Exam: General: No acute distress  Head: Normocephalic, atraumatic. Moist oral mucosal membranes  Eyes: Anicteric  Neck: Supple, trachea midline  Lungs:  Clear to auscultation, normal effort  Heart: S1S2 no rubs  Abdomen:  Soft, nontender  Extremities: Trace peripheral edema.  Neurologic: Nonfocal, moving all four extremities  Skin: No lesions       Basic Metabolic Panel:  Recent Labs Lab 01/27/17 1503 01/27/17 1850 01/28/17 0458 01/28/17 1816 01/29/17 0324  NA 135 131* 139 136 142  K 2.7 Repeated and verified X2.* 2.6* 2.5* 3.0* 3.7  CL 86* 85* 97* 95* 102  CO2 38* 33* 34* 33* 33*  GLUCOSE 101* 187* 181* 111* 81  BUN 46* 48* 43* 34* 33*  CREATININE 2.96* 3.11* 2.55* 2.09* 2.09*  CALCIUM 8.7 8.1* 7.5* 7.7* 8.0*  MG  --   --  1.7  --  2.0  PHOS  --   --  2.9  --  2.5    Liver Function Tests:  Recent Labs Lab 01/27/17 1503 01/28/17 0458 01/29/17 0324  AST 923* 446* 254*  ALT 507* 365* 307*  ALKPHOS 55 47 46  BILITOT 0.9 1.1 0.9  PROT 6.6 5.7* 5.9*  ALBUMIN 3.4* 2.6* 2.7*   No results for input(s): LIPASE, AMYLASE in the last 168 hours. No results for input(s): AMMONIA in the last 168 hours.  CBC:  Recent Labs Lab 01/27/17 1503 01/27/17 1850 01/28/17 0458  WBC 11.1* 10.5 7.9   NEUTROABS 8.5*  --   --   HGB 10.2* 10.4* 9.8*  HCT 31.7* 32.0* 30.2*  MCV 74.7* 75.3* 75.2*  PLT 280.0 261 233    Cardiac Enzymes:  Recent Labs Lab 01/28/17 1121  CKTOTAL 283*    BNP: Invalid input(s): POCBNP  CBG:  Recent Labs Lab 01/28/17 2058 01/29/17 0056 01/29/17 0519 01/29/17 0728 01/29/17 1107  GLUCAP 160* 102* 75 53* 220*    Microbiology: Results for orders placed or performed during the hospital encounter of 06/02/15  MRSA PCR Screening     Status: None   Collection Time: 06/02/15 10:31 PM  Result Value Ref Range Status   MRSA by PCR NEGATIVE NEGATIVE Final    Comment:        The GeneXpert MRSA Assay (FDA approved for NASAL specimens only), is one component of a comprehensive MRSA colonization surveillance program. It is not intended to diagnose MRSA infection nor to guide or monitor treatment for MRSA infections.     Coagulation Studies:  Recent Labs  01/28/17 0458  LABPROT 20.5*  INR 1.73    Urinalysis:  Recent Labs  01/27/17 2250  COLORURINE STRAW*  LABSPEC 1.004*  PHURINE 6.0  GLUCOSEU NEGATIVE  HGBUR SMALL*  BILIRUBINUR NEGATIVE  KETONESUR NEGATIVE  PROTEINUR NEGATIVE  NITRITE NEGATIVE  LEUKOCYTESUR NEGATIVE      Imaging: Ct Abdomen Pelvis Wo Contrast  Result Date: 01/28/2017 CLINICAL DATA:  Generalized weakness and decreased appetite. Acute renal failure. Symptoms for 6 days. EXAM: CT ABDOMEN AND PELVIS WITHOUT CONTRAST TECHNIQUE: Multidetector CT imaging of the abdomen and pelvis was performed following the standard protocol without IV contrast. COMPARISON:  Right upper quadrant ultrasound 01/27/2017. FINDINGS: Lower chest: Mild atelectasis is seen in the left lung base. Small Bochdalek's hernia on the left is identified. Heart size is upper normal. Punctate calcified granuloma right lower lobe is noted. No pleural or pericardial effusion. Hepatobiliary: There is subtle nodularity of the border of the left hepatic lobe  worrisome for cirrhosis. The liver is low attenuating. No focal liver lesion is identified. The gallbladder is mildly distended without surrounding inflammatory change. No stone is seen. The biliary tree is unremarkable. Pancreas: Unremarkable. No pancreatic ductal dilatation or surrounding inflammatory changes. Spleen: Normal in size without focal abnormality. Adrenals/Urinary Tract: The adrenal glands appear normal. There is no hydronephrosis on the right or left. Multiple vascular calcifications are seen the kidneys but no definite renal stone is identified. The urinary bladder is distended but otherwise unremarkable. Ureters appear normal. Stomach/Bowel: Air and fluid collection the posterior wall of the fourth portion of the duodenum measures proximally and 1.8 cm transverse by 1.9 cm craniocaudal by 1.3 cm AP and is likely a diverticulum. The small bowel otherwise appears normal. The stomach and colon are normal in appearance. No evidence of appendicitis is seen. Vascular/Lymphatic: Extensive aortoiliac atherosclerosis without aneurysm is identified. No lymphadenopathy. Reproductive: Status post hysterectomy. No adnexal masses. Other: No fluid collection or hernia. Musculoskeletal: Convex right lumbar scoliosis and marked multilevel degenerative disc disease are seen. IMPRESSION: Fatty infiltration with a nodular border worrisome for cirrhosis. Extensive atherosclerosis. Duodenal diverticulum. Electronically Signed   By: Inge Rise M.D.   On: 01/28/2017 12:58   [place] Art/ven Flow Abd Pelv Doppler  Result Date: 01/28/2017 CLINICAL DATA:  Abnormal LFTs, evidence of fatty liver and cirrhosis by CT EXAM: DUPLEX ULTRASOUND OF LIVER TECHNIQUE: Color and duplex Doppler ultrasound was performed to evaluate the hepatic in-flow and out-flow vessels. COMPARISON:  01/28/2017 FINDINGS: Portal Vein Velocities Main:  26 cm/sec Right:  20 cm/sec Left:  17 cm/sec Hepatic Vein Velocities Right:  23 cm/sec Middle:  27  cm/sec Left:  34 cm/sec Hepatic Artery Velocity:  62 cm/sec Splenic Vein Velocity:  13 cm/sec Varices: Not visualized Ascites: None. Patent portal, hepatic, and splenic veins with normal directional flow. Negative for portal vein occlusion or thrombus. Liver demonstrates increased echogenicity and surface nodularity of the left hepatic lobe compatible with fatty infiltration and developing early cirrhosis. Spleen is normal in size measuring 9.5 x 10.4 x 3.2 cm. IMPRESSION: Normal liver Doppler evaluation. Evidence of hepatic steatosis and early cirrhosis. Electronically Signed   By: Jerilynn Mages.  Shick M.D.   On: 01/28/2017 15:04   US Abdomen Limited Ruq  Result Date: 01/27/2017 CLINICAL DATA:  Unspecified disorder of liver function, elevated liver function tests EXAM: US ABDOMEN LIMITED - RIGHT UPPER QUADRANT COMPARISON:  None. FINDINGS: Gallbladder: Tumefactive biliary sludge is seen within. Moderate gallbladder distention. Trace pericholecystic fluid. No sonographic Murphy sign noted by sonographer. Common bile duct: Diameter: Measures up to 10 mm in caliber without choledocholithiasis. Liver: No focal lesion identified.  Mild intrahepatic ductal dilatation. Note is made of moderate right-sided hydronephrosis. IMPRESSION: Moderate gallbladder distention containing tumefactive biliary sludge and trace pericholecystic fluid. Intra and extrahepatic ductal  dilatation. No definite evidence of choledocholithiasis. Biliary sludge within the bile ducts could potentially contribute to this appearance. Moderate hydronephrosis of visualized right kidney. Electronically Signed   By: Ashley Royalty M.D.   On: 01/27/2017 22:37     Medications:    . ALPRAZolam  0.25 mg Oral BID  . aspirin  325 mg Oral QHS  . calcitRIOL  0.25 mcg Oral Daily  . cholecalciferol  1,000 Units Oral Daily  . enalapril  5 mg Oral Daily  . fludrocortisone  0.1 mg Oral Daily  . gabapentin  300 mg Oral TID  . heparin  5,000 Units Subcutaneous Q8H  .  insulin aspart  0-15 Units Subcutaneous Q4H  . insulin glargine  15 Units Subcutaneous Daily  . loratadine  10 mg Oral BID  . methadone  20 mg Oral QID  . metoprolol tartrate  12.5 mg Oral BID  . mirtazapine  15 mg Oral QHS  . pantoprazole  40 mg Oral BID  . phytonadione  10 mg Subcutaneous Daily  . sodium chloride flush  3 mL Intravenous Q12H   acetaminophen **OR** acetaminophen, albuterol, bisacodyl, ipratropium, magnesium citrate, metolazone, nitroGLYCERIN, ondansetron **OR** ondansetron (ZOFRAN) IV, senna-docusate  Assessment/ Plan:  61 y.o. Caucasian female with past medical history of diabetes mellitus type 2, hyperlipidemia, episodic mood disorder, coronary artery disease, peripheral vascular disease, GERD, degenerative joint disease, chronic back pain, peripheral neuropathy, right-sided heart failure, chronic respiratory failure on home oxygen, pulmonary hypertension, chronic kidney disease stage IV   1.  Acute renal failure. 2.  CKD stage IV baseline EGFR 27 3.  Severe hypokalemia, torsemide stopped, still on fludrocortisone 4.  Anemia of CKD.    Plan:  Creatinine significantly improved.  CT scan abdomen and pelvis was reviewed as there was suggestion of hydronephrosis on a prior ultrasound. However there was no sign of hydronephrosis on the CT scan.  Serum potassium has also improved significantly to 3.7. Serum sodium is also up to 142. Recommend restarting torsemide at 20 mg daily instead of twice a day which she was on previously. Also discontinue metolazone. She will need continued monitoring of her renal function as well as serum electro lites as an outpatient. Patient verbalized understanding of this. Patient is okay for discharge today.   LOS: 1 LATEEF, MUNSOOR 4/4/201811:30 AM

## 2017-01-30 LAB — CELIAC DISEASE PANEL
ENDOMYSIAL ANTIBODY IGA: NEGATIVE
IgA: 212 mg/dL (ref 87–352)

## 2017-01-30 LAB — HEPATITIS C VRS RNA DETECT BY PCR-QUAL: Hepatitis C Vrs RNA by PCR-Qual: NEGATIVE

## 2017-01-31 ENCOUNTER — Telehealth: Payer: Self-pay | Admitting: *Deleted

## 2017-01-31 NOTE — Telephone Encounter (Signed)
Transition Care Management Follow-up Telephone Call   Date discharged?01/29/2017   How have you been since you were released from the hospital? "feeling pretty good."   Do you understand why you were in the hospital? yes   Do you understand the discharge instructions? yes   Where were you discharged to? home   Items Reviewed:  Medications reviewed: yes  Allergies reviewed: yes  Dietary changes reviewed: n/a  Referrals reviewed: n/a   Functional Questionnaire:   Activities of Daily Living (ADLs):   She states they are independent in the following: pt not needing any assistance Any transportation issues/concerns?: no   Any patient concerns? no   Confirmed importance and date/time of follow-up visits scheduled yes  Provider Appointment booked with Dr Silvio Pate 02/05/2017 @ 1100  Confirmed with patient if condition begins to worsen call PCP or go to the ER.  Patient was given the office number and encouraged to call back with question or concerns.  : yes

## 2017-02-03 ENCOUNTER — Other Ambulatory Visit: Payer: Self-pay | Admitting: Internal Medicine

## 2017-02-04 ENCOUNTER — Telehealth: Payer: Self-pay

## 2017-02-04 ENCOUNTER — Other Ambulatory Visit: Payer: Self-pay

## 2017-02-04 DIAGNOSIS — J449 Chronic obstructive pulmonary disease, unspecified: Secondary | ICD-10-CM | POA: Diagnosis not present

## 2017-02-04 DIAGNOSIS — R7989 Other specified abnormal findings of blood chemistry: Secondary | ICD-10-CM

## 2017-02-04 DIAGNOSIS — R945 Abnormal results of liver function studies: Secondary | ICD-10-CM

## 2017-02-04 DIAGNOSIS — I509 Heart failure, unspecified: Secondary | ICD-10-CM | POA: Diagnosis not present

## 2017-02-04 NOTE — Telephone Encounter (Signed)
Advised pt of results per Dr. Vicente Males.   Labs ordered.   Patient to have labs on Friday.

## 2017-02-04 NOTE — Telephone Encounter (Signed)
-----  Message from Jonathon Bellows, MD sent at 02/03/2017 10:01 AM EDT ----- Hep C, celiac , Hep B negative. Suggest repeat LFT's this week and follow up as scheduled

## 2017-02-05 ENCOUNTER — Ambulatory Visit (INDEPENDENT_AMBULATORY_CARE_PROVIDER_SITE_OTHER): Payer: PPO | Admitting: Internal Medicine

## 2017-02-05 ENCOUNTER — Encounter: Payer: Self-pay | Admitting: Internal Medicine

## 2017-02-05 VITALS — BP 118/62 | HR 96 | Temp 98.3°F | Wt 167.0 lb

## 2017-02-05 DIAGNOSIS — M5440 Lumbago with sciatica, unspecified side: Secondary | ICD-10-CM

## 2017-02-05 DIAGNOSIS — R7989 Other specified abnormal findings of blood chemistry: Secondary | ICD-10-CM | POA: Diagnosis not present

## 2017-02-05 DIAGNOSIS — R945 Abnormal results of liver function studies: Secondary | ICD-10-CM

## 2017-02-05 DIAGNOSIS — N179 Acute kidney failure, unspecified: Secondary | ICD-10-CM

## 2017-02-05 DIAGNOSIS — E876 Hypokalemia: Secondary | ICD-10-CM | POA: Diagnosis not present

## 2017-02-05 DIAGNOSIS — G8929 Other chronic pain: Secondary | ICD-10-CM

## 2017-02-05 LAB — HEPATIC FUNCTION PANEL
ALT: 534 U/L — AB (ref 0–35)
AST: 103 U/L — AB (ref 0–37)
Albumin: 3.4 g/dL — ABNORMAL LOW (ref 3.5–5.2)
Alkaline Phosphatase: 90 U/L (ref 39–117)
BILIRUBIN DIRECT: 0.8 mg/dL — AB (ref 0.0–0.3)
BILIRUBIN TOTAL: 1.8 mg/dL — AB (ref 0.2–1.2)
Total Protein: 6.8 g/dL (ref 6.0–8.3)

## 2017-02-05 LAB — RENAL FUNCTION PANEL
ALBUMIN: 3.4 g/dL — AB (ref 3.5–5.2)
BUN: 28 mg/dL — ABNORMAL HIGH (ref 6–23)
CHLORIDE: 92 meq/L — AB (ref 96–112)
CO2: 35 mEq/L — ABNORMAL HIGH (ref 19–32)
Calcium: 9.1 mg/dL (ref 8.4–10.5)
Creatinine, Ser: 1.77 mg/dL — ABNORMAL HIGH (ref 0.40–1.20)
GFR: 30.78 mL/min — ABNORMAL LOW (ref >60.00)
GLUCOSE: 226 mg/dL — AB (ref 70–99)
PHOSPHORUS: 3 mg/dL (ref 2.3–4.6)
POTASSIUM: 3.1 meq/L — AB (ref 3.5–5.1)
SODIUM: 136 meq/L (ref 135–145)

## 2017-02-05 MED ORDER — METHADONE HCL 10 MG PO TABS: 20.0000 mg | ORAL_TABLET | Freq: Four times a day (QID) | ORAL | 0 refills | Status: DC

## 2017-02-05 NOTE — Assessment & Plan Note (Signed)
Due to overdiuresis apparently Will recheck Has appt with Dr Holley Raring tomorrow

## 2017-02-05 NOTE — Progress Notes (Signed)
Pre visit review using our clinic review tool, if applicable. No additional management support is needed unless otherwise documented below in the visit note.

## 2017-02-05 NOTE — Assessment & Plan Note (Addendum)
Due for methadone refill Checked CSRS--only from here

## 2017-02-05 NOTE — Assessment & Plan Note (Signed)
Unclear etiology but improved without specific treatment Will recheck today Not sure she needs GI follow up if better

## 2017-02-05 NOTE — Assessment & Plan Note (Signed)
Better since hospital On less diuretics with stable weight

## 2017-02-05 NOTE — Progress Notes (Signed)
Subjective:    Patient ID: Lindsay Harmon, female    DOB: 1955/11/19, 61 y.o.   MRN: [id number]  HPI Here for hospital follow up Sent to ER after severe hypokalemia noted on office labs Was very fatigued-- "I could feel myself going downhill" Weights had been stable Nausea and weak No SOB  Found to have hypokalemia and AKI Diuretics cut back Elevated liver tests of unclear significance These improved and ultrasound followed by CT scan didn't show clear cut gallbladder disease (just ?early cirrhosis) Reviewed all hospital records Will have follow up with Dr Vicente Males (GI)  Off metolazone and torsemide down to once a day No sig edema  Breathing is okay No chest pain No palpitations Some lightheadedness if she moves too quick--nothing new  Current Outpatient Prescriptions on File Prior to Visit  Medication Sig Dispense Refill  . albuterol (PROVENTIL HFA;VENTOLIN HFA) 108 (90 Base) MCG/ACT inhaler Inhale 2 puffs into the lungs every 6 (six) hours as needed for wheezing or shortness of breath. 1 Inhaler 2  . ALPRAZolam (XANAX) 0.25 MG tablet Take 0.25 mg by mouth 2 (two) times daily.     Marland Kitchen aspirin 325 MG tablet Take 325 mg by mouth at bedtime.     . calcitRIOL (ROCALTROL) 0.25 MCG capsule Take 0.25 mcg by mouth daily.    . cholecalciferol (VITAMIN D) 1000 units tablet Take 1,000 Units by mouth daily.    . enalapril (VASOTEC) 5 MG tablet Take 1 tablet (5 mg total) by mouth daily. 30 tablet 0  . fludrocortisone (FLORINEF) 0.1 MG tablet Take 0.1 mg by mouth daily.    Marland Kitchen gabapentin (NEURONTIN) 300 MG capsule Pt takes one capsule in the morning, one at noon, and two at bedtime. 360 capsule 3  . Insulin Glargine (LANTUS SOLOSTAR) 100 UNIT/ML Solostar Pen Inject 30 Units into the skin daily.    Marland Kitchen loratadine (CLARITIN) 10 MG tablet Take 10 mg by mouth 2 (two) times daily.     Marland Kitchen lovastatin (MEVACOR) 40 MG tablet Take 80 mg by mouth at bedtime.    . methadone (DOLOPHINE) 10 MG tablet Take 2  tablets (20 mg total) by mouth 4 (four) times daily. Fill in about 1 week 240 tablet 0  . metoprolol tartrate (LOPRESSOR) 25 MG tablet Take 0.5 tablets (12.5 mg total) by mouth 2 (two) times daily. 90 tablet 3  . mirtazapine (REMERON) 30 MG tablet Take 15 mg by mouth at bedtime.    . nitroGLYCERIN (NITROSTAT) 0.4 MG SL tablet Place 1 tablet (0.4 mg total) under the tongue every 5 (five) minutes as needed for chest pain. 100 tablet 1  . ONE TOUCH ULTRA TEST test strip USE 1 STRIP TO TEST BLOOD SUGAR ONCE DAILY 100 each 4  . pantoprazole (PROTONIX) 40 MG tablet Take 1 tablet (40 mg total) by mouth 2 (two) times daily. 60 tablet 3  . SURE COMFORT PEN NEEDLES 31G X 5 MM MISC     . Tiotropium Bromide-Olodaterol (STIOLTO RESPIMAT) 2.5-2.5 MCG/ACT AERS Inhale 2 puffs into the lungs daily. 4 g 5  . torsemide (DEMADEX) 20 MG tablet Take 1 tablet (20 mg total) by mouth daily. 1 tablet 0   No current facility-administered medications on file prior to visit.     Allergies  Allergen Reactions  . Sertraline Hcl Other (See Comments)    Malaise    Past Medical History:  Diagnosis Date  . Allergy   . Arthritis   . CAD (coronary artery disease)   .  Depression   . Esophageal stricture   . Familial hematuria   . GERD (gastroesophageal reflux disease)   . Hyperlipidemia   . Hypertension   . IBS (irritable bowel syndrome)   . Nephrolithiasis   . NIDDM (non-insulin dependent diabetes mellitus)    with neuropathy  . Obesity, unspecified   . Pulmonary hypertension   . PVD (peripheral vascular disease) ([place])     Past Surgical History:  Procedure Laterality Date  . ABDOMINAL HYSTERECTOMY    . CARPAL TUNNEL RELEASE    . CESAREAN SECTION    . RIGHT HEART CATHETERIZATION N/A 12/05/2014   Procedure: RIGHT HEART CATH;  Surgeon: Sinclair Grooms, MD;  Location: [facility] CATH LAB;  Service: Cardiovascular;  Laterality: N/A;    Family History  Problem Relation Age of Onset  . Diabetes Mother   . Cancer  Mother     ovarian,melanoma  . Allergies Father   . Cancer Father     lung  . Cancer Maternal Grandmother     uterine  . Emphysema Paternal Grandfather   . Allergies Sister   . Breast cancer Neg Hx     Social History   Social History  . Marital status: Legally Separated    Spouse name: N/A  . Number of children: 1  . Years of education: N/A   Occupational History  . 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 Smoking status: Former Smoker    Packs/day: 1.50    Years: 20.00    Types: Cigarettes    Quit date: 01/04/2009  . Smokeless tobacco: Never Used  . Alcohol use No     Comment: heavy in the past  . Drug use: No  . Sexual activity: No   Other Topics Concern  . Not on file   Social History Narrative   No living will   Son [name] should make decisions for her if she is unable.    Would accept resuscitation attempts but no prolonged ventilation   Not sure about tube feeds but probably wouldn't want them if cognitively unaware   Review of Systems Weights stable No N/V Appetite is good Bowels are slow---none in several days    Objective:   Physical Exam  Neck: No thyromegaly present.  Cardiovascular: Normal rate, regular rhythm and normal heart sounds.  Exam reveals no gallop.   No murmur heard. Pulmonary/Chest: Effort normal and breath sounds normal. No respiratory distress. She has no wheezes. She has no rales.  Abdominal: Soft. There is no tenderness.  Musculoskeletal: She exhibits no edema.  Lymphadenopathy:    She has no cervical adenopathy.          Assessment & Plan:

## 2017-02-06 DIAGNOSIS — N183 Chronic kidney disease, stage 3 (moderate): Secondary | ICD-10-CM | POA: Diagnosis not present

## 2017-02-06 DIAGNOSIS — E876 Hypokalemia: Secondary | ICD-10-CM | POA: Diagnosis not present

## 2017-02-06 DIAGNOSIS — E871 Hypo-osmolality and hyponatremia: Secondary | ICD-10-CM | POA: Diagnosis not present

## 2017-02-07 ENCOUNTER — Telehealth: Payer: Self-pay

## 2017-02-07 NOTE — Telephone Encounter (Signed)
Spoke to pt. She has some glipizide 29m. Will keep uK[place]updated

## 2017-02-07 NOTE — Telephone Encounter (Signed)
Have her restart the glipizide at just one tab daily with breakfast. (confirm she has 58m) Be careful for low sugar reactions Have her let me know in the next 1-2 weeks if it is still regularly over 200

## 2017-02-07 NOTE — Telephone Encounter (Signed)
Pt said she was in hospital first of April and saw Dr Silvio Pate on 02/05/17. On 02/05/17 FBS was 146. On 02/06/17 FBS 248 and 02/07/17 FBS was 256; no diet or exercise changes. Glipizide was stopped when pt was in hospital; pt taking Lantus 30 U in AM. Pt feels fine but does not want high BS. Pt request cb with what to do. Pt does have glipizide at home. Hyman Hopes.

## 2017-02-10 ENCOUNTER — Ambulatory Visit
Admission: RE | Admit: 2017-02-10 | Discharge: 2017-02-10 | Disposition: A | Discharge status: Home / Self Care | Payer: PPO | Source: Ambulatory Visit | Attending: Nephrology | Admitting: Nephrology

## 2017-02-10 DIAGNOSIS — N183 Chronic kidney disease, stage 3 (moderate): Secondary | ICD-10-CM | POA: Insufficient documentation

## 2017-02-10 DIAGNOSIS — E871 Hypo-osmolality and hyponatremia: Secondary | ICD-10-CM | POA: Diagnosis not present

## 2017-02-10 MED ORDER — SODIUM CHLORIDE FLUSH 0.9 % IV SOLN
INTRAVENOUS | Status: AC
  Administered 2017-02-10: 10 mL
  Administered 2017-02-10: 15:00:00
  Filled 2017-02-10: qty 20

## 2017-02-10 MED ORDER — FUROSEMIDE 10 MG/ML IJ SOLN
INTRAMUSCULAR | Status: AC
  Administered 2017-02-10: 40 mg
  Filled 2017-02-10: qty 4

## 2017-02-13 ENCOUNTER — Ambulatory Visit
Admission: RE | Admit: 2017-02-13 | Discharge: 2017-02-13 | Disposition: A | Discharge status: Home / Self Care | Payer: PPO | Source: Ambulatory Visit | Attending: Nephrology | Admitting: Nephrology

## 2017-02-13 DIAGNOSIS — E871 Hypo-osmolality and hyponatremia: Secondary | ICD-10-CM | POA: Diagnosis not present

## 2017-02-13 DIAGNOSIS — N183 Chronic kidney disease, stage 3 (moderate): Secondary | ICD-10-CM | POA: Insufficient documentation

## 2017-02-13 MED ORDER — SODIUM CHLORIDE 0.9% FLUSH
10.0000 mL | Freq: Once | INTRAVENOUS | Status: AC
  Administered 2017-02-13: 10 mL via INTRAVENOUS

## 2017-02-13 MED ORDER — FUROSEMIDE 10 MG/ML IJ SOLN
40.0000 mg | Freq: Once | INTRAMUSCULAR | Status: AC
  Administered 2017-02-13: 40 mg via INTRAVENOUS

## 2017-02-14 DIAGNOSIS — E871 Hypo-osmolality and hyponatremia: Secondary | ICD-10-CM | POA: Diagnosis not present

## 2017-02-14 DIAGNOSIS — N183 Chronic kidney disease, stage 3 (moderate): Secondary | ICD-10-CM | POA: Diagnosis not present

## 2017-02-14 DIAGNOSIS — E1122 Type 2 diabetes mellitus with diabetic chronic kidney disease: Secondary | ICD-10-CM | POA: Diagnosis not present

## 2017-02-14 DIAGNOSIS — I10 Essential (primary) hypertension: Secondary | ICD-10-CM | POA: Diagnosis not present

## 2017-02-18 ENCOUNTER — Encounter: Payer: Self-pay | Admitting: Internal Medicine

## 2017-02-18 ENCOUNTER — Ambulatory Visit (INDEPENDENT_AMBULATORY_CARE_PROVIDER_SITE_OTHER): Payer: PPO | Admitting: Internal Medicine

## 2017-02-18 VITALS — BP 134/84 | HR 94 | Temp 98.4°F | Wt 166.0 lb

## 2017-02-18 DIAGNOSIS — I5032 Chronic diastolic (congestive) heart failure: Secondary | ICD-10-CM

## 2017-02-18 NOTE — Progress Notes (Signed)
Pre visit review using our clinic review tool, if applicable. No additional management support is needed unless otherwise documented below in the visit note.

## 2017-02-18 NOTE — Patient Instructions (Signed)
Please hold the fludrocortisone for a few days--and increase the torsemide to twice a day. Go to Dr Elwyn Lade office on Friday for lab work

## 2017-02-18 NOTE — Assessment & Plan Note (Signed)
Not exacerbated and weight stable but her edema is worse and uncomfortable Discussed on phone with Dr Holley Raring Will hold the fludrocortisone and increase torsemide to bid for a few days She will go to his office in 3 days for repeat blood work

## 2017-02-18 NOTE — Progress Notes (Signed)
Subjective:    Patient ID: Lindsay Harmon, female    DOB: 06/06/1956, 61 y.o.   MRN: [id number]  HPI Here due to worsening of her edema Dr Holley Raring in the hospital --so not available to see her  Was worse last week--got IV furosemide twice last week (??44m) Is better now but still an issue Lower legs are sensitive and itchy due to the swelling Trying to keep them elevated  Weight is stable--at home and here Breathing is okay Now only taking torsemide once a day  Current Outpatient Prescriptions on File Prior to Visit  Medication Sig Dispense Refill  . albuterol (PROVENTIL HFA;VENTOLIN HFA) 108 (90 Base) MCG/ACT inhaler Inhale 2 puffs into the lungs every 6 (six) hours as needed for wheezing or shortness of breath. 1 Inhaler 2  . ALPRAZolam (XANAX) 0.25 MG tablet Take 0.25 mg by mouth 2 (two) times daily.     .Marland Kitchenaspirin 325 MG tablet Take 325 mg by mouth at bedtime.     . calcitRIOL (ROCALTROL) 0.25 MCG capsule Take 0.25 mcg by mouth daily.    . cholecalciferol (VITAMIN D) 1000 units tablet Take 1,000 Units by mouth daily.    . enalapril (VASOTEC) 5 MG tablet Take 1 tablet (5 mg total) by mouth daily. 30 tablet 0  . fludrocortisone (FLORINEF) 0.1 MG tablet Take 0.1 mg by mouth daily.    .Marland Kitchengabapentin (NEURONTIN) 300 MG capsule Pt takes one capsule in the morning, one at noon, and two at bedtime. 360 capsule 3  . glipiZIDE (GLUCOTROL) 5 MG tablet Take 5 mg by mouth daily before breakfast.    . Insulin Glargine (LANTUS SOLOSTAR) 100 UNIT/ML Solostar Pen Inject 30 Units into the skin daily.    .Marland Kitchenloratadine (CLARITIN) 10 MG tablet Take 10 mg by mouth 2 (two) times daily.     .Marland Kitchenlovastatin (MEVACOR) 40 MG tablet Take 80 mg by mouth at bedtime.    . methadone (DOLOPHINE) 10 MG tablet Take 2 tablets (20 mg total) by mouth 4 (four) times daily. 240 tablet 0  . metoprolol tartrate (LOPRESSOR) 25 MG tablet Take 0.5 tablets (12.5 mg total) by mouth 2 (two) times daily. 90 tablet 3  . mirtazapine  (REMERON) 30 MG tablet Take 15 mg by mouth at bedtime.    . nitroGLYCERIN (NITROSTAT) 0.4 MG SL tablet Place 1 tablet (0.4 mg total) under the tongue every 5 (five) minutes as needed for chest pain. 100 tablet 1  . ONE TOUCH ULTRA TEST test strip USE 1 STRIP TO TEST BLOOD SUGAR ONCE DAILY 100 each 4  . pantoprazole (PROTONIX) 40 MG tablet Take 1 tablet (40 mg total) by mouth 2 (two) times daily. 60 tablet 3  . SURE COMFORT PEN NEEDLES 31G X 5 MM MISC     . Tiotropium Bromide-Olodaterol (STIOLTO RESPIMAT) 2.5-2.5 MCG/ACT AERS Inhale 2 puffs into the lungs daily. 4 g 5  . torsemide (DEMADEX) 20 MG tablet Take 1 tablet (20 mg total) by mouth daily. 1 tablet 0   No current facility-administered medications on file prior to visit.     Allergies  Allergen Reactions  . Sertraline Hcl Other (See Comments)    Malaise    Past Medical History:  Diagnosis Date  . Allergy   . Arthritis   . CAD (coronary artery disease)   . Depression   . Esophageal stricture   . Familial hematuria   . GERD (gastroesophageal reflux disease)   . Hyperlipidemia   . Hypertension   .  IBS (irritable bowel syndrome)   . Nephrolithiasis   . NIDDM (non-insulin dependent diabetes mellitus)    with neuropathy  . Obesity, unspecified   . Pulmonary hypertension ([place])   . PVD (peripheral vascular disease) ([place])     Past Surgical History:  Procedure Laterality Date  . ABDOMINAL HYSTERECTOMY    . CARPAL TUNNEL RELEASE    . CESAREAN SECTION    . RIGHT HEART CATHETERIZATION N/A 12/05/2014   Procedure: RIGHT HEART CATH;  Surgeon: Sinclair Grooms, MD;  Location: [facility] CATH LAB;  Service: Cardiovascular;  Laterality: N/A;    Family History  Problem Relation Age of Onset  . Diabetes Mother   . Cancer Mother     ovarian,melanoma  . Allergies Father   . Cancer Father     lung  . Cancer Maternal Grandmother     uterine  . Emphysema Paternal Grandfather   . Allergies Sister   . Breast cancer Neg Hx     Social  History   Social History  . Marital status: Legally Separated    Spouse name: N/A  . Number of children: 1  . Years of education: N/A   Occupational History  . 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 Smoking status: Former Smoker    Packs/day: 1.50    Years: 20.00    Types: Cigarettes    Quit date: 01/04/2009  . Smokeless tobacco: Never Used  . Alcohol use No     Comment: heavy in the past  . Drug use: No  . Sexual activity: No   Other Topics Concern  . Not on file   Social History Narrative   No living will   Son [name] should make decisions for her if she is unable.    Would accept resuscitation attempts but no prolonged ventilation   Not sure about tube feeds but probably wouldn't want them if cognitively unaware   Review of Systems  Appetite is okay No chest pain Sleeps in chair--- no PND     Objective:   Physical Exam  Constitutional: No distress.  Cardiovascular: Normal rate, regular rhythm and normal heart sounds.  Exam reveals no gallop.   No murmur heard. Pulmonary/Chest: Effort normal and breath sounds normal. No respiratory distress. She has no wheezes. She has no rales.  Musculoskeletal:  1-2+ tense edema in calves--not really feet as bad. Stasis changes are prominent in right calf          Assessment & Plan:

## 2017-02-21 DIAGNOSIS — I10 Essential (primary) hypertension: Secondary | ICD-10-CM | POA: Diagnosis not present

## 2017-02-21 DIAGNOSIS — N183 Chronic kidney disease, stage 3 (moderate): Secondary | ICD-10-CM | POA: Diagnosis not present

## 2017-02-21 DIAGNOSIS — N181 Chronic kidney disease, stage 1: Secondary | ICD-10-CM | POA: Diagnosis not present

## 2017-02-21 DIAGNOSIS — E1122 Type 2 diabetes mellitus with diabetic chronic kidney disease: Secondary | ICD-10-CM | POA: Diagnosis not present

## 2017-02-26 ENCOUNTER — Ambulatory Visit (INDEPENDENT_AMBULATORY_CARE_PROVIDER_SITE_OTHER): Payer: PPO | Admitting: Gastroenterology

## 2017-02-26 ENCOUNTER — Encounter: Payer: Self-pay | Admitting: Gastroenterology

## 2017-02-26 ENCOUNTER — Other Ambulatory Visit: Payer: Self-pay

## 2017-02-26 ENCOUNTER — Other Ambulatory Visit
Admission: RE | Admit: 2017-02-26 | Discharge: 2017-02-26 | Disposition: A | Discharge status: Home / Self Care | Payer: PPO | Source: Ambulatory Visit | Attending: Gastroenterology | Admitting: Gastroenterology

## 2017-02-26 VITALS — BP 112/62 | HR 101 | Temp 98.1°F | Ht 61.0 in | Wt 161.6 lb

## 2017-02-26 DIAGNOSIS — K838 Other specified diseases of biliary tract: Secondary | ICD-10-CM | POA: Diagnosis not present

## 2017-02-26 DIAGNOSIS — R945 Abnormal results of liver function studies: Secondary | ICD-10-CM

## 2017-02-26 DIAGNOSIS — K7460 Unspecified cirrhosis of liver: Secondary | ICD-10-CM | POA: Diagnosis not present

## 2017-02-26 DIAGNOSIS — R7989 Other specified abnormal findings of blood chemistry: Secondary | ICD-10-CM

## 2017-02-26 LAB — COMPREHENSIVE METABOLIC PANEL
ALT: 15 U/L (ref 14–54)
ANION GAP: 10 (ref 5–15)
AST: 30 U/L (ref 15–41)
Albumin: 3.6 g/dL (ref 3.5–5.0)
Alkaline Phosphatase: 90 U/L (ref 38–126)
BILIRUBIN TOTAL: 0.5 mg/dL (ref 0.3–1.2)
BUN: 16 mg/dL (ref 6–20)
CHLORIDE: 87 mmol/L — AB (ref 101–111)
CO2: 32 mmol/L (ref 22–32)
Calcium: 9.2 mg/dL (ref 8.9–10.3)
Creatinine, Ser: 1.83 mg/dL — ABNORMAL HIGH (ref 0.44–1.00)
GFR, EST AFRICAN AMERICAN: 33 mL/min — AB (ref >60)
GFR, EST NON AFRICAN AMERICAN: 28 mL/min — AB (ref >60)
Glucose, Bld: 126 mg/dL — ABNORMAL HIGH (ref 65–99)
POTASSIUM: 4.5 mmol/L (ref 3.5–5.1)
Sodium: 129 mmol/L — ABNORMAL LOW (ref 135–145)
TOTAL PROTEIN: 7.9 g/dL (ref 6.5–8.1)

## 2017-02-26 LAB — CBC WITH DIFFERENTIAL/PLATELET
Basophils Absolute: 0.1 10*3/uL (ref 0–0.1)
Basophils Relative: 1 %
EOS PCT: 3 %
Eosinophils Absolute: 0.3 10*3/uL (ref 0–0.7)
HCT: 36.4 % (ref 35.0–47.0)
Hemoglobin: 11.6 g/dL — ABNORMAL LOW (ref 12.0–16.0)
LYMPHS PCT: 25 %
Lymphs Abs: 2.3 10*3/uL (ref 1.0–3.6)
MCH: 23.4 pg — ABNORMAL LOW (ref 26.0–34.0)
MCHC: 31.8 g/dL — AB (ref 32.0–36.0)
MCV: 73.7 fL — AB (ref 80.0–100.0)
MONO ABS: 1 10*3/uL — AB (ref 0.2–0.9)
MONOS PCT: 10 %
NEUTROS ABS: 5.5 10*3/uL (ref 1.4–6.5)
Neutrophils Relative %: 61 %
PLATELETS: 412 10*3/uL (ref 150–440)
RBC: 4.94 MIL/uL (ref 3.80–5.20)
RDW: 20 % — AB (ref 11.5–14.5)
WBC: 9.3 10*3/uL (ref 3.6–11.0)

## 2017-02-26 LAB — PROTIME-INR
INR: 1.19
PROTHROMBIN TIME: 15.2 s (ref 11.4–15.2)

## 2017-02-26 NOTE — Progress Notes (Signed)
Primary Care Physician: Viviana Simpler, MD  Primary Gastroenterologist:  Dr. Jonathon Bellows   Chief Complaint  Patient presents with  . Hospitalization Follow-up    HPI: STEPHAINE BRESHEARS is a 61 y.o. female is here today for a hospital follow up . She was admitted on 01/28/17 with weakness. On admission he was found to have eleavted LFT'Hb 10.2, mcv 75 , Cr 3.11, low chloride and low potassium. AST 923, ALT 507 ,normal bilirubin . INR 1.73.AST/ALT were normal in 06/2016 . RUQ USG shows sludge in gall bladder ,CBD dilated to 10 mm with intra and extra hepatic biliary dilation, moderate hydronephrosis of the right kidney. Tbilirubin not elevated. My impression was that he had ischemic hepattiis from dehydration .  Iron studies demonstrated low iron at 10 , iron % saturation of 3 . Acute viral hepatitis panel ,celiac serology was negative. Plan at discharge was for MRCP when renal function improved. Out patient EGD+colonoscopy to evaluate iron deficiency anemia.   Interval history 01/2017-02/2017  Mild elevation in T bilirubin ,AST/ALT improved since discharge. She says she has had some nose bleeding due to long term oxygen. No vaginal bleeding , rectal bleeding.Last colonoscopy was many years back. She feels well.    Hepatic Function Latest Ref Rng & Units 02/05/2017 02/05/2017 01/29/2017  Total Protein 6.0 - 8.3 g/dL 6.8 - 5.9(L)  Albumin 3.5 - 5.2 g/dL 3.4(L) 3.4(L) 2.7(L)  AST 0 - 37 U/L 103(H) - 254(H)  ALT 0 - 35 U/L 534(H) - 307(H)  Alk Phosphatase 39 - 117 U/L 90 - 46  Total Bilirubin 0.2 - 1.2 mg/dL 1.8(H) - 0.9  Bilirubin, Direct 0.0 - 0.3 mg/dL 0.8(H) - 0.2       Current Outpatient Prescriptions  Medication Sig Dispense Refill  . albuterol (PROVENTIL HFA;VENTOLIN HFA) 108 (90 Base) MCG/ACT inhaler Inhale 2 puffs into the lungs every 6 (six) hours as needed for wheezing or shortness of breath. 1 Inhaler 2  . ALPRAZolam (XANAX) 0.25 MG tablet Take 0.25 mg by mouth 2 (two) times daily.      Marland Kitchen aspirin 325 MG tablet Take 325 mg by mouth at bedtime.     . calcitRIOL (ROCALTROL) 0.25 MCG capsule Take 0.25 mcg by mouth daily.    . cholecalciferol (VITAMIN D) 1000 units tablet Take 1,000 Units by mouth daily.    . enalapril (VASOTEC) 5 MG tablet Take 1 tablet (5 mg total) by mouth daily. 30 tablet 0  . fludrocortisone (FLORINEF) 0.1 MG tablet Take 0.1 mg by mouth daily.    Marland Kitchen gabapentin (NEURONTIN) 300 MG capsule Pt takes one capsule in the morning, one at noon, and two at bedtime. 360 capsule 3  . glipiZIDE (GLUCOTROL) 5 MG tablet Take 5 mg by mouth daily before breakfast.    . Insulin Glargine (LANTUS SOLOSTAR) 100 UNIT/ML Solostar Pen Inject 30 Units into the skin daily.    Marland Kitchen loratadine (CLARITIN) 10 MG tablet Take 10 mg by mouth 2 (two) times daily.     Marland Kitchen lovastatin (MEVACOR) 40 MG tablet Take 80 mg by mouth at bedtime.    . methadone (DOLOPHINE) 10 MG tablet Take 2 tablets (20 mg total) by mouth 4 (four) times daily. 240 tablet 0  . metoprolol tartrate (LOPRESSOR) 25 MG tablet Take 0.5 tablets (12.5 mg total) by mouth 2 (two) times daily. 90 tablet 3  . mirtazapine (REMERON) 30 MG tablet Take 15 mg by mouth at bedtime.    . nitroGLYCERIN (NITROSTAT) 0.4 MG SL  tablet Place 1 tablet (0.4 mg total) under the tongue every 5 (five) minutes as needed for chest pain. 100 tablet 1  . ONE TOUCH ULTRA TEST test strip USE 1 STRIP TO TEST BLOOD SUGAR ONCE DAILY 100 each 4  . pantoprazole (PROTONIX) 40 MG tablet Take 1 tablet (40 mg total) by mouth 2 (two) times daily. 60 tablet 3  . potassium chloride SA (K-DUR,KLOR-CON) 20 MEQ tablet Take 1 tablet by mouth 2 (two) times daily.    . SURE COMFORT PEN NEEDLES 31G X 5 MM MISC     . Tiotropium Bromide-Olodaterol (STIOLTO RESPIMAT) 2.5-2.5 MCG/ACT AERS Inhale 2 puffs into the lungs daily. 4 g 5  . torsemide (DEMADEX) 20 MG tablet Take 1 tablet (20 mg total) by mouth daily. 1 tablet 0   No current facility-administered medications for this visit.       Allergies as of 02/26/2017 - Review Complete 02/26/2017  Allergen Reaction Noted  . Sertraline hcl Other (See Comments) 05/29/2016    ROS:  General: Negative for anorexia, weight loss, fever, chills, fatigue, weakness. ENT: Negative for hoarseness, difficulty swallowing , nasal congestion. CV: Negative for chest pain, angina, palpitations, dyspnea on exertion, peripheral edema.  Respiratory: Negative for dyspnea at rest, dyspnea on exertion, cough, sputum, wheezing.  GI: See history of present illness. GU:  Negative for dysuria, hematuria, urinary incontinence, urinary frequency, nocturnal urination.  Endo: Negative for unusual weight change.    Physical Examination:   BP 112/62   Pulse (!) 101   Temp 98.1 F (36.7 C) (Oral)   Ht 5' 1" (1.549 m)   Wt 161 lb 9.6 oz (73.3 kg)   BMI 30.53 kg/m   General: Well-nourished, well-developed in no acute distress. With mobile oxygen  Eyes: No icterus. Conjunctivae pink. Mouth: Oropharyngeal mucosa moist and pink , no lesions erythema or exudate. Lungs: Clear to auscultation bilaterally. Non-labored. Heart: Regular rate and rhythm, no murmurs rubs or gallops.  Abdomen: Bowel sounds are normal, nontender, nondistended, no hepatosplenomegaly or masses, no abdominal bruits or hernia , no rebound or guarding.   Extremities: No lower extremity edema. No clubbing or deformities. Neuro: Alert and oriented x 3.  Grossly intact. Skin: Warm and dry, no jaundice.   Psych: Alert and cooperative, normal mood and affect.   Imaging Studies: Ct Abdomen Pelvis Wo Contrast  Result Date: 01/28/2017 CLINICAL DATA:  Generalized weakness and decreased appetite. Acute renal failure. Symptoms for 6 days. EXAM: CT ABDOMEN AND PELVIS WITHOUT CONTRAST TECHNIQUE: Multidetector CT imaging of the abdomen and pelvis was performed following the standard protocol without IV contrast. COMPARISON:  Right upper quadrant ultrasound 01/27/2017. FINDINGS: Lower chest:  Mild atelectasis is seen in the left lung base. Small Bochdalek's hernia on the left is identified. Heart size is upper normal. Punctate calcified granuloma right lower lobe is noted. No pleural or pericardial effusion. Hepatobiliary: There is subtle nodularity of the border of the left hepatic lobe worrisome for cirrhosis. The liver is low attenuating. No focal liver lesion is identified. The gallbladder is mildly distended without surrounding inflammatory change. No stone is seen. The biliary tree is unremarkable. Pancreas: Unremarkable. No pancreatic ductal dilatation or surrounding inflammatory changes. Spleen: Normal in size without focal abnormality. Adrenals/Urinary Tract: The adrenal glands appear normal. There is no hydronephrosis on the right or left. Multiple vascular calcifications are seen the kidneys but no definite renal stone is identified. The urinary bladder is distended but otherwise unremarkable. Ureters appear normal. Stomach/Bowel: Air and fluid collection  the posterior wall of the fourth portion of the duodenum measures proximally and 1.8 cm transverse by 1.9 cm craniocaudal by 1.3 cm AP and is likely a diverticulum. The small bowel otherwise appears normal. The stomach and colon are normal in appearance. No evidence of appendicitis is seen. Vascular/Lymphatic: Extensive aortoiliac atherosclerosis without aneurysm is identified. No lymphadenopathy. Reproductive: Status post hysterectomy. No adnexal masses. Other: No fluid collection or hernia. Musculoskeletal: Convex right lumbar scoliosis and marked multilevel degenerative disc disease are seen. IMPRESSION: Fatty infiltration with a nodular border worrisome for cirrhosis. Extensive atherosclerosis. Duodenal diverticulum. Electronically Signed   By: Inge Rise M.D.   On: 01/28/2017 12:58   [place] Art/ven Flow Abd Pelv Doppler  Result Date: 01/28/2017 CLINICAL DATA:  Abnormal LFTs, evidence of fatty liver and cirrhosis by CT EXAM: DUPLEX  ULTRASOUND OF LIVER TECHNIQUE: Color and duplex Doppler ultrasound was performed to evaluate the hepatic in-flow and out-flow vessels. COMPARISON:  01/28/2017 FINDINGS: Portal Vein Velocities Main:  26 cm/sec Right:  20 cm/sec Left:  17 cm/sec Hepatic Vein Velocities Right:  23 cm/sec Middle:  27 cm/sec Left:  34 cm/sec Hepatic Artery Velocity:  62 cm/sec Splenic Vein Velocity:  13 cm/sec Varices: Not visualized Ascites: None. Patent portal, hepatic, and splenic veins with normal directional flow. Negative for portal vein occlusion or thrombus. Liver demonstrates increased echogenicity and surface nodularity of the left hepatic lobe compatible with fatty infiltration and developing early cirrhosis. Spleen is normal in size measuring 9.5 x 10.4 x 3.2 cm. IMPRESSION: Normal liver Doppler evaluation. Evidence of hepatic steatosis and early cirrhosis. Electronically Signed   By: Jerilynn Mages.  Shick M.D.   On: 01/28/2017 15:04   US Abdomen Limited Ruq  Result Date: 01/27/2017 CLINICAL DATA:  Unspecified disorder of liver function, elevated liver function tests EXAM: US ABDOMEN LIMITED - RIGHT UPPER QUADRANT COMPARISON:  None. FINDINGS: Gallbladder: Tumefactive biliary sludge is seen within. Moderate gallbladder distention. Trace pericholecystic fluid. No sonographic Murphy sign noted by sonographer. Common bile duct: Diameter: Measures up to 10 mm in caliber without choledocholithiasis. Liver: No focal lesion identified.  Mild intrahepatic ductal dilatation. Note is made of moderate right-sided hydronephrosis. IMPRESSION: Moderate gallbladder distention containing tumefactive biliary sludge and trace pericholecystic fluid. Intra and extrahepatic ductal dilatation. No definite evidence of choledocholithiasis. Biliary sludge within the bile ducts could potentially contribute to this appearance. Moderate hydronephrosis of visualized right kidney. Electronically Signed   By: Ashley Royalty M.D.   On: 01/27/2017 22:37    Assessment  and Plan:   FRIMY UFFELMAN is a 61 y.o. y/o female here today for a hospital follow up for microcytic iron deficiency anemia, dilated CBD on USG with initially normal bilirubin which is now slightly elevated with aTbilirubin of 1.0 , abnormal LFT's. My impression of the abnormal LFT's was that it was likely secondary to ischemic hepatitis but at the same time she was noticed to have a dilated common bile duct with no stones within but sludge in the gall bladder, she may have a stone in the CBD. She also has features of cirrhosis on her liver - will perform lab work   Plan  1.  Repeat CBC,BMP,LFT,INR- If LFT's still elevated will get autoimmune screen  2. EUS to evaluate dilated CBD as she has abnormal renal functions and MRCP would not be ideal to r/o stones in CBD or an ampullary adenoma  . 3. EGD+colonoscopy to evaluate for iron deficiency anemia after EUS.  4. Autoimmune screen for liver cirrhosis,,ANA, AMA &  anti-SMA.LKM ab,Immunoglobulins,Ceruloplasmin,-1 antitrypsin,celiac serology  Dr Jonathon Bellows  MD Follow up in 4-6 weeks

## 2017-02-27 ENCOUNTER — Other Ambulatory Visit: Payer: Self-pay

## 2017-02-27 ENCOUNTER — Telehealth: Payer: Self-pay

## 2017-02-27 NOTE — Telephone Encounter (Signed)
  Oncology Nurse Navigator Documentation Received referral from Dr. Vicente Males for EUS. EUS has been scheduled for 03/13/17 with Dr. Mont Dutton at [place]. Called and went over instructions with Ms. Fackrell and copy of instructions mailed to home address along with my contact information for any future questions. Educated regarding diabetic medications per instruction sheet. Encouraged higher protein light meal the evening before her procedure to help stabilize blood sugar. Reports only anticoagulant is Aspirin. Notified Dr. Georgeann Oppenheim office to obtain medical clearance if needed.  INSTRUCTIONS FOR ENDOSCOPIC ULTRASOUND -Your procedure has been scheduled for May 17th with Dr. Mont Dutton at [facility]. -The hospital may contact you to pre-register over the phone.  -To get your scheduled arrival time, please call the Endoscopy unit at  [phone] between 1-3 p.m. on:  May 16th    -ON THE DAY OF YOU PROCEDURE:   1. If you are scheduled for a morning procedure, nothing to drink after midnight  -If you are scheduled for an afternoon procedure, you may have clear liquids until 5 hours prior  to the procedure but no carbonated drinks or broth  2. NO FOOD THE DAY OF YOUR PROCEDURE  3. You may take your heart, seizure, blood pressure, Parkinson's or breathing medications at  6am with just enough water to get your pills down  4. Do not take any oral Diabetic medications the morning of your procedure.  5. If you are a diabetic and are using insulin, please notify your prescribing physician of this  procedure as your dose may need to be altered related to not being able to eat or drink.   5. Do not take vitamins, iron, or fish oil for 5 days before your procedure     -On the day of your procedure, come to the [facility] Admitting/Registration desk (First desk on the right) at the scheduled arrival time. You MUST have someone drive you home from your procedure. You must have a responsible  adult with a valid driver's license who is on site throughout your entire procedure and who can stay with you for several hours after your procedure. You may not go home alone in a taxi, shuttle [place] or bus, as the drivers will not be responsible for you.  --If you have any questions please call me at the above contact      Navigator Location: [facility] (02/27/17 0900)   )Navigator Encounter Type: Telephone (02/27/17 0900) Telephone: Lahoma Crocker Call (02/27/17 0900)                       Barriers/Navigation Needs: Coordination of Care;Education (02/27/17 0900)   Interventions: Coordination of Care (02/27/17 0900)   Coordination of Care: EUS (02/27/17 0900)        Acuity: Level 2 (02/27/17 0900)   Acuity Level 2: Initial guidance, education and coordination as needed;Educational needs;Ongoing guidance and education throughout treatment as needed (02/27/17 0900)     Time Spent with Patient: 45 (02/27/17 0900)

## 2017-02-28 ENCOUNTER — Telehealth: Payer: Self-pay

## 2017-02-28 NOTE — Telephone Encounter (Signed)
Spoke to pt. She was worried that her A1C would be high and Dr Silvio Pate had said he would be fine with her A1C around an 8. I told her that an average blood sugar of 183 would give her an a1c around 8. She said that was very helpful and gave her a good guide.

## 2017-02-28 NOTE — Telephone Encounter (Signed)
Those numbers are good for her---actually just where I would like them (want them under 200 but not so low that she would have low sugar reactions) Continue current meds

## 2017-02-28 NOTE — Telephone Encounter (Signed)
Pt is still running high; FBS today was 168. FBS averaging 160's - 180's. Pt following diabetic diet. Taking lantus 30 units at breakfast and glipizide 5 mg daily. Pt is asymptomatic and feels OK. Pt wants to know what to do. Lindsay Harmon.

## 2017-03-04 ENCOUNTER — Telehealth: Payer: Self-pay

## 2017-03-04 ENCOUNTER — Other Ambulatory Visit: Payer: Self-pay | Admitting: *Deleted

## 2017-03-04 ENCOUNTER — Other Ambulatory Visit: Payer: Self-pay | Admitting: Internal Medicine

## 2017-03-04 MED ORDER — INSULIN GLARGINE 100 UNIT/ML SOLOSTAR PEN: PEN_INJECTOR | SUBCUTANEOUS | 3 refills | Status: DC

## 2017-03-04 NOTE — Telephone Encounter (Signed)
-----  Message from Jonathon Bellows, MD sent at 03/02/2017  9:37 PM EDT ----- Lindsay Harmon please inform -Elevated creatinine and low sodium- my last note suggests she is on fludrocortisone , her chloride is low, she may be a bit dehydrated, she will need to touch base with Dr Everardo Beals office to help follow her labs .  C/c Dr Silvio Pate

## 2017-03-04 NOTE — Telephone Encounter (Signed)
Advised pt of lab results per Dr. Vicente Males.   Suggested drinking Gatorade/fluids to help with dehydration and follow-up with Dr. Silvio Pate.

## 2017-03-06 DIAGNOSIS — I509 Heart failure, unspecified: Secondary | ICD-10-CM | POA: Diagnosis not present

## 2017-03-06 DIAGNOSIS — J449 Chronic obstructive pulmonary disease, unspecified: Secondary | ICD-10-CM | POA: Diagnosis not present

## 2017-03-11 ENCOUNTER — Telehealth: Payer: Self-pay

## 2017-03-11 NOTE — Telephone Encounter (Signed)
She should not take the glipizide and only take half of the lantus on the day of the surgery (or for the lantus, the night before if that is when she takes it)

## 2017-03-11 NOTE — Telephone Encounter (Signed)
Pt left v/m; pt is scheduled for endoscopic and Dr Vicente Males office advised pt to contact Dr Silvio Pate about possible insulin adjustment since pt cannot eat or drink prior to procedure. Pt request cb.

## 2017-03-11 NOTE — Telephone Encounter (Signed)
Spoke to pt. She takes it in the morning.

## 2017-03-13 ENCOUNTER — Ambulatory Visit
Admission: RE | Admit: 2017-03-13 | Discharge: 2017-03-13 | Disposition: A | Discharge status: Home / Self Care | Payer: PPO | Source: Ambulatory Visit | Attending: Internal Medicine | Admitting: Internal Medicine

## 2017-03-13 ENCOUNTER — Ambulatory Visit: Payer: PPO | Admitting: Anesthesiology

## 2017-03-13 ENCOUNTER — Encounter
Admission: RE | Disposition: A | Discharge status: Home / Self Care | Payer: Self-pay | Source: Ambulatory Visit | Attending: Internal Medicine

## 2017-03-13 DIAGNOSIS — E119 Type 2 diabetes mellitus without complications: Secondary | ICD-10-CM | POA: Diagnosis not present

## 2017-03-13 DIAGNOSIS — I10 Essential (primary) hypertension: Secondary | ICD-10-CM | POA: Diagnosis not present

## 2017-03-13 DIAGNOSIS — K8050 Calculus of bile duct without cholangitis or cholecystitis without obstruction: Secondary | ICD-10-CM | POA: Diagnosis not present

## 2017-03-13 DIAGNOSIS — Z794 Long term (current) use of insulin: Secondary | ICD-10-CM | POA: Diagnosis not present

## 2017-03-13 DIAGNOSIS — J449 Chronic obstructive pulmonary disease, unspecified: Secondary | ICD-10-CM | POA: Insufficient documentation

## 2017-03-13 DIAGNOSIS — Z87891 Personal history of nicotine dependence: Secondary | ICD-10-CM | POA: Diagnosis not present

## 2017-03-13 DIAGNOSIS — K8020 Calculus of gallbladder without cholecystitis without obstruction: Secondary | ICD-10-CM | POA: Diagnosis not present

## 2017-03-13 DIAGNOSIS — K5710 Diverticulosis of small intestine without perforation or abscess without bleeding: Secondary | ICD-10-CM | POA: Diagnosis not present

## 2017-03-13 DIAGNOSIS — I739 Peripheral vascular disease, unspecified: Secondary | ICD-10-CM | POA: Diagnosis not present

## 2017-03-13 DIAGNOSIS — Z7982 Long term (current) use of aspirin: Secondary | ICD-10-CM | POA: Diagnosis not present

## 2017-03-13 DIAGNOSIS — K8070 Calculus of gallbladder and bile duct without cholecystitis without obstruction: Secondary | ICD-10-CM | POA: Diagnosis not present

## 2017-03-13 DIAGNOSIS — K219 Gastro-esophageal reflux disease without esophagitis: Secondary | ICD-10-CM | POA: Diagnosis not present

## 2017-03-13 DIAGNOSIS — Z79899 Other long term (current) drug therapy: Secondary | ICD-10-CM | POA: Insufficient documentation

## 2017-03-13 DIAGNOSIS — F329 Major depressive disorder, single episode, unspecified: Secondary | ICD-10-CM | POA: Diagnosis not present

## 2017-03-13 DIAGNOSIS — I2510 Atherosclerotic heart disease of native coronary artery without angina pectoris: Secondary | ICD-10-CM | POA: Diagnosis not present

## 2017-03-13 HISTORY — PX: EUS: SHX5427

## 2017-03-13 HISTORY — DX: Chronic obstructive pulmonary disease, unspecified: J44.9

## 2017-03-13 SURGERY — ULTRASOUND, UPPER GI TRACT, ENDOSCOPIC
Anesthesia: General

## 2017-03-13 MED ORDER — SODIUM CHLORIDE 0.9 % IV SOLN
INTRAVENOUS | Status: DC
  Administered 2017-03-13: 11:00:00 via INTRAVENOUS

## 2017-03-13 MED ORDER — LIDOCAINE HCL (CARDIAC) 20 MG/ML IV SOLN
INTRAVENOUS | Status: DC | PRN
  Administered 2017-03-13: 60 mg via INTRAVENOUS

## 2017-03-13 MED ORDER — PROPOFOL 10 MG/ML IV BOLUS
INTRAVENOUS | Status: DC | PRN
  Administered 2017-03-13: 40 mg via INTRAVENOUS

## 2017-03-13 MED ORDER — LIDOCAINE HCL 2 % IJ SOLN
INTRAMUSCULAR | Status: AC
  Filled 2017-03-13: qty 10

## 2017-03-13 MED ORDER — PROPOFOL 500 MG/50ML IV EMUL
INTRAVENOUS | Status: DC | PRN
  Administered 2017-03-13: 100 ug/kg/min via INTRAVENOUS

## 2017-03-13 MED ORDER — PHENYLEPHRINE HCL 10 MG/ML IJ SOLN
INTRAMUSCULAR | Status: DC | PRN
  Administered 2017-03-13 (×2): 50 ug via INTRAVENOUS

## 2017-03-13 MED ORDER — PROPOFOL 500 MG/50ML IV EMUL
INTRAVENOUS | Status: AC
  Filled 2017-03-13: qty 50

## 2017-03-13 MED ORDER — MIDAZOLAM HCL 2 MG/2ML IJ SOLN
INTRAMUSCULAR | Status: DC | PRN
  Administered 2017-03-13: 1 mg via INTRAVENOUS

## 2017-03-13 MED ORDER — MIDAZOLAM HCL 2 MG/2ML IJ SOLN
INTRAMUSCULAR | Status: AC
  Filled 2017-03-13: qty 2

## 2017-03-13 NOTE — Transfer of Care (Signed)
Immediate Anesthesia Transfer of Care Note  Patient: Lindsay Harmon  Procedure(s) Performed: Procedure(s): FULL UPPER ENDOSCOPIC ULTRASOUND (EUS) RADIAL (N/A)  Patient Location: PACU  Anesthesia Type:General  Level of Consciousness: sedated  Airway & Oxygen Therapy: Patient Spontanous Breathing and Patient connected to nasal cannula oxygen  Post-op Assessment: Report given to RN and Post -op Vital signs reviewed and stable  Post vital signs: Reviewed and stable  Last Vitals:  Vitals:   03/13/17 1049 03/13/17 1205  BP: 109/60 (!) 111/57  Pulse: 84 88  Resp: (!) 22 (!) 24  Temp: 36.2 C 36.7 C    Last Pain:  Vitals:   03/13/17 1205  TempSrc: Tympanic         Complications: No apparent anesthesia complications

## 2017-03-13 NOTE — Discharge Instructions (Signed)
Discharge to home

## 2017-03-13 NOTE — Interval H&P Note (Signed)
History and Physical Interval Note:  03/13/2017 11:17 AM  Lindsay Harmon  has presented today for surgery, with the diagnosis of Dilated CBD  The various methods of treatment have been discussed with the patient and family. After consideration of risks, benefits and other options for treatment, the patient has consented to  Procedure(s): FULL UPPER ENDOSCOPIC ULTRASOUND (EUS) RADIAL (N/A) as a surgical intervention .  The patient's history has been reviewed, patient examined, no change in status, stable for surgery.  I have reviewed the patient's chart and labs.  Questions were answered to the patient's satisfaction.     Tillie Rung

## 2017-03-13 NOTE — Anesthesia Postprocedure Evaluation (Signed)
Anesthesia Post Note  Patient: Lindsay Harmon  Procedure(s) Performed: Procedure(s) (LRB): FULL UPPER ENDOSCOPIC ULTRASOUND (EUS) RADIAL (N/A)  Patient location during evaluation: Endoscopy Anesthesia Type: General Level of consciousness: awake and alert Pain management: pain level controlled Vital Signs Assessment: post-procedure vital signs reviewed and stable Respiratory status: spontaneous breathing, nonlabored ventilation, respiratory function stable and patient connected to nasal cannula oxygen Cardiovascular status: blood pressure returned to baseline and stable Postop Assessment: no signs of nausea or vomiting Anesthetic complications: no     Last Vitals:  Vitals:   03/13/17 1215 03/13/17 1225  BP: (!) 85/52 107/60  Pulse: 83 82  Resp: 11 14  Temp:      Last Pain:  Vitals:   03/13/17 1205  TempSrc: Tympanic                 [name]

## 2017-03-13 NOTE — Anesthesia Preprocedure Evaluation (Signed)
Anesthesia Evaluation  Patient identified by MRN, date of birth, ID band Patient awake    Reviewed: Allergy & Precautions, NPO status , Patient's Chart, lab work & pertinent test results, reviewed documented beta blocker date and time   Airway Mallampati: II  TM Distance: >3 FB     Dental  (+) Chipped   Pulmonary COPD, former smoker,           Cardiovascular hypertension, Pt. on medications and Pt. on home beta blockers + angina + CAD and + Peripheral Vascular Disease       Neuro/Psych PSYCHIATRIC DISORDERS Depression  Neuromuscular disease    GI/Hepatic GERD  ,  Endo/Other  diabetes, Type 2  Renal/GU Renal InsufficiencyRenal disease     Musculoskeletal  (+) Arthritis ,   Abdominal   Peds  Hematology   Anesthesia Other Findings   Reproductive/Obstetrics                             Anesthesia Physical Anesthesia Plan  ASA: III  Anesthesia Plan: General   Post-op Pain Management:    Induction: Intravenous  Airway Management Planned:   Additional Equipment:   Intra-op Plan:   Post-operative Plan:   Informed Consent: I have reviewed the patients History and Physical, chart, labs and discussed the procedure including the risks, benefits and alternatives for the proposed anesthesia with the patient or authorized representative who has indicated his/her understanding and acceptance.     Plan Discussed with: CRNA  Anesthesia Plan Comments:         Anesthesia Quick Evaluation

## 2017-03-13 NOTE — H&P (View-Only) (Signed)
Primary Care Physician: Viviana Simpler, MD  Primary Gastroenterologist:  Dr. Jonathon Bellows   Chief Complaint  Patient presents with  . Hospitalization Follow-up    HPI: STEPHAINE BRESHEARS is a 61 y.o. female is here today for a hospital follow up . She was admitted on 01/28/17 with weakness. On admission he was found to have eleavted LFT'Hb 10.2, mcv 75 , Cr 3.11, low chloride and low potassium. AST 923, ALT 507 ,normal bilirubin . INR 1.73.AST/ALT were normal in 06/2016 . RUQ USG shows sludge in gall bladder ,CBD dilated to 10 mm with intra and extra hepatic biliary dilation, moderate hydronephrosis of the right kidney. Tbilirubin not elevated. My impression was that he had ischemic hepattiis from dehydration .  Iron studies demonstrated low iron at 10 , iron % saturation of 3 . Acute viral hepatitis panel ,celiac serology was negative. Plan at discharge was for MRCP when renal function improved. Out patient EGD+colonoscopy to evaluate iron deficiency anemia.   Interval history 01/2017-02/2017  Mild elevation in T bilirubin ,AST/ALT improved since discharge. She says she has had some nose bleeding due to long term oxygen. No vaginal bleeding , rectal bleeding.Last colonoscopy was many years back. She feels well.    Hepatic Function Latest Ref Rng & Units 02/05/2017 02/05/2017 01/29/2017  Total Protein 6.0 - 8.3 g/dL 6.8 - 5.9(L)  Albumin 3.5 - 5.2 g/dL 3.4(L) 3.4(L) 2.7(L)  AST 0 - 37 U/L 103(H) - 254(H)  ALT 0 - 35 U/L 534(H) - 307(H)  Alk Phosphatase 39 - 117 U/L 90 - 46  Total Bilirubin 0.2 - 1.2 mg/dL 1.8(H) - 0.9  Bilirubin, Direct 0.0 - 0.3 mg/dL 0.8(H) - 0.2       Current Outpatient Prescriptions  Medication Sig Dispense Refill  . albuterol (PROVENTIL HFA;VENTOLIN HFA) 108 (90 Base) MCG/ACT inhaler Inhale 2 puffs into the lungs every 6 (six) hours as needed for wheezing or shortness of breath. 1 Inhaler 2  . ALPRAZolam (XANAX) 0.25 MG tablet Take 0.25 mg by mouth 2 (two) times daily.      Marland Kitchen aspirin 325 MG tablet Take 325 mg by mouth at bedtime.     . calcitRIOL (ROCALTROL) 0.25 MCG capsule Take 0.25 mcg by mouth daily.    . cholecalciferol (VITAMIN D) 1000 units tablet Take 1,000 Units by mouth daily.    . enalapril (VASOTEC) 5 MG tablet Take 1 tablet (5 mg total) by mouth daily. 30 tablet 0  . fludrocortisone (FLORINEF) 0.1 MG tablet Take 0.1 mg by mouth daily.    Marland Kitchen gabapentin (NEURONTIN) 300 MG capsule Pt takes one capsule in the morning, one at noon, and two at bedtime. 360 capsule 3  . glipiZIDE (GLUCOTROL) 5 MG tablet Take 5 mg by mouth daily before breakfast.    . Insulin Glargine (LANTUS SOLOSTAR) 100 UNIT/ML Solostar Pen Inject 30 Units into the skin daily.    Marland Kitchen loratadine (CLARITIN) 10 MG tablet Take 10 mg by mouth 2 (two) times daily.     Marland Kitchen lovastatin (MEVACOR) 40 MG tablet Take 80 mg by mouth at bedtime.    . methadone (DOLOPHINE) 10 MG tablet Take 2 tablets (20 mg total) by mouth 4 (four) times daily. 240 tablet 0  . metoprolol tartrate (LOPRESSOR) 25 MG tablet Take 0.5 tablets (12.5 mg total) by mouth 2 (two) times daily. 90 tablet 3  . mirtazapine (REMERON) 30 MG tablet Take 15 mg by mouth at bedtime.    . nitroGLYCERIN (NITROSTAT) 0.4 MG SL  tablet Place 1 tablet (0.4 mg total) under the tongue every 5 (five) minutes as needed for chest pain. 100 tablet 1  . ONE TOUCH ULTRA TEST test strip USE 1 STRIP TO TEST BLOOD SUGAR ONCE DAILY 100 each 4  . pantoprazole (PROTONIX) 40 MG tablet Take 1 tablet (40 mg total) by mouth 2 (two) times daily. 60 tablet 3  . potassium chloride SA (K-DUR,KLOR-CON) 20 MEQ tablet Take 1 tablet by mouth 2 (two) times daily.    . SURE COMFORT PEN NEEDLES 31G X 5 MM MISC     . Tiotropium Bromide-Olodaterol (STIOLTO RESPIMAT) 2.5-2.5 MCG/ACT AERS Inhale 2 puffs into the lungs daily. 4 g 5  . torsemide (DEMADEX) 20 MG tablet Take 1 tablet (20 mg total) by mouth daily. 1 tablet 0   No current facility-administered medications for this visit.       Allergies as of 02/26/2017 - Review Complete 02/26/2017  Allergen Reaction Noted  . Sertraline hcl Other (See Comments) 05/29/2016    ROS:  General: Negative for anorexia, weight loss, fever, chills, fatigue, weakness. ENT: Negative for hoarseness, difficulty swallowing , nasal congestion. CV: Negative for chest pain, angina, palpitations, dyspnea on exertion, peripheral edema.  Respiratory: Negative for dyspnea at rest, dyspnea on exertion, cough, sputum, wheezing.  GI: See history of present illness. GU:  Negative for dysuria, hematuria, urinary incontinence, urinary frequency, nocturnal urination.  Endo: Negative for unusual weight change.    Physical Examination:   BP 112/62   Pulse (!) 101   Temp 98.1 F (36.7 C) (Oral)   Ht 5' 1" (1.549 m)   Wt 161 lb 9.6 oz (73.3 kg)   BMI 30.53 kg/m   General: Well-nourished, well-developed in no acute distress. With mobile oxygen  Eyes: No icterus. Conjunctivae pink. Mouth: Oropharyngeal mucosa moist and pink , no lesions erythema or exudate. Lungs: Clear to auscultation bilaterally. Non-labored. Heart: Regular rate and rhythm, no murmurs rubs or gallops.  Abdomen: Bowel sounds are normal, nontender, nondistended, no hepatosplenomegaly or masses, no abdominal bruits or hernia , no rebound or guarding.   Extremities: No lower extremity edema. No clubbing or deformities. Neuro: Alert and oriented x 3.  Grossly intact. Skin: Warm and dry, no jaundice.   Psych: Alert and cooperative, normal mood and affect.   Imaging Studies: Ct Abdomen Pelvis Wo Contrast  Result Date: 01/28/2017 CLINICAL DATA:  Generalized weakness and decreased appetite. Acute renal failure. Symptoms for 6 days. EXAM: CT ABDOMEN AND PELVIS WITHOUT CONTRAST TECHNIQUE: Multidetector CT imaging of the abdomen and pelvis was performed following the standard protocol without IV contrast. COMPARISON:  Right upper quadrant ultrasound 01/27/2017. FINDINGS: Lower chest:  Mild atelectasis is seen in the left lung base. Small Bochdalek's hernia on the left is identified. Heart size is upper normal. Punctate calcified granuloma right lower lobe is noted. No pleural or pericardial effusion. Hepatobiliary: There is subtle nodularity of the border of the left hepatic lobe worrisome for cirrhosis. The liver is low attenuating. No focal liver lesion is identified. The gallbladder is mildly distended without surrounding inflammatory change. No stone is seen. The biliary tree is unremarkable. Pancreas: Unremarkable. No pancreatic ductal dilatation or surrounding inflammatory changes. Spleen: Normal in size without focal abnormality. Adrenals/Urinary Tract: The adrenal glands appear normal. There is no hydronephrosis on the right or left. Multiple vascular calcifications are seen the kidneys but no definite renal stone is identified. The urinary bladder is distended but otherwise unremarkable. Ureters appear normal. Stomach/Bowel: Air and fluid collection   the posterior wall of the fourth portion of the duodenum measures proximally and 1.8 cm transverse by 1.9 cm craniocaudal by 1.3 cm AP and is likely a diverticulum. The small bowel otherwise appears normal. The stomach and colon are normal in appearance. No evidence of appendicitis is seen. Vascular/Lymphatic: Extensive aortoiliac atherosclerosis without aneurysm is identified. No lymphadenopathy. Reproductive: Status post hysterectomy. No adnexal masses. Other: No fluid collection or hernia. Musculoskeletal: Convex right lumbar scoliosis and marked multilevel degenerative disc disease are seen. IMPRESSION: Fatty infiltration with a nodular border worrisome for cirrhosis. Extensive atherosclerosis. Duodenal diverticulum. Electronically Signed   By: Inge Rise M.D.   On: 01/28/2017 12:58   [place] Art/ven Flow Abd Pelv Doppler  Result Date: 01/28/2017 CLINICAL DATA:  Abnormal LFTs, evidence of fatty liver and cirrhosis by CT EXAM: DUPLEX  ULTRASOUND OF LIVER TECHNIQUE: Color and duplex Doppler ultrasound was performed to evaluate the hepatic in-flow and out-flow vessels. COMPARISON:  01/28/2017 FINDINGS: Portal Vein Velocities Main:  26 cm/sec Right:  20 cm/sec Left:  17 cm/sec Hepatic Vein Velocities Right:  23 cm/sec Middle:  27 cm/sec Left:  34 cm/sec Hepatic Artery Velocity:  62 cm/sec Splenic Vein Velocity:  13 cm/sec Varices: Not visualized Ascites: None. Patent portal, hepatic, and splenic veins with normal directional flow. Negative for portal vein occlusion or thrombus. Liver demonstrates increased echogenicity and surface nodularity of the left hepatic lobe compatible with fatty infiltration and developing early cirrhosis. Spleen is normal in size measuring 9.5 x 10.4 x 3.2 cm. IMPRESSION: Normal liver Doppler evaluation. Evidence of hepatic steatosis and early cirrhosis. Electronically Signed   By: Jerilynn Mages.  Shick M.D.   On: 01/28/2017 15:04   US Abdomen Limited Ruq  Result Date: 01/27/2017 CLINICAL DATA:  Unspecified disorder of liver function, elevated liver function tests EXAM: US ABDOMEN LIMITED - RIGHT UPPER QUADRANT COMPARISON:  None. FINDINGS: Gallbladder: Tumefactive biliary sludge is seen within. Moderate gallbladder distention. Trace pericholecystic fluid. No sonographic Murphy sign noted by sonographer. Common bile duct: Diameter: Measures up to 10 mm in caliber without choledocholithiasis. Liver: No focal lesion identified.  Mild intrahepatic ductal dilatation. Note is made of moderate right-sided hydronephrosis. IMPRESSION: Moderate gallbladder distention containing tumefactive biliary sludge and trace pericholecystic fluid. Intra and extrahepatic ductal dilatation. No definite evidence of choledocholithiasis. Biliary sludge within the bile ducts could potentially contribute to this appearance. Moderate hydronephrosis of visualized right kidney. Electronically Signed   By: Ashley Royalty M.D.   On: 01/27/2017 22:37    Assessment  and Plan:   KRYSTIN KEEVEN is a 61 y.o. y/o female here today for a hospital follow up for microcytic iron deficiency anemia, dilated CBD on USG with initially normal bilirubin which is now slightly elevated with aTbilirubin of 1.0 , abnormal LFT's. My impression of the abnormal LFT's was that it was likely secondary to ischemic hepatitis but at the same time she was noticed to have a dilated common bile duct with no stones within but sludge in the gall bladder, she may have a stone in the CBD. She also has features of cirrhosis on her liver - will perform lab work   Plan  1.  Repeat CBC,BMP,LFT,INR- If LFT's still elevated will get autoimmune screen  2. EUS to evaluate dilated CBD as she has abnormal renal functions and MRCP would not be ideal to r/o stones in CBD or an ampullary adenoma  . 3. EGD+colonoscopy to evaluate for iron deficiency anemia after EUS.  4. Autoimmune screen for liver cirrhosis,,ANA, AMA &  anti-SMA.LKM ab,Immunoglobulins,Ceruloplasmin,-1 antitrypsin,celiac serology  Dr Jonathon Bellows  MD Follow up in 4-6 weeks

## 2017-03-13 NOTE — Op Note (Signed)
[facility] Patient Name: Lindsay Harmon Procedure Date: 03/13/2017 11:23 AM MRN: [id number] Account #: [id number] Date of Birth: Aug 08, 1956 Admit Type: Outpatient Age: 61 Room: [facility] ENDO ROOM 3 Gender: Female Note Status: Finalized Procedure:            Upper EUS Indications:          Abnormal ultrasound of the abdomen: cholelithiasis and                        mildly dilated bile duct, Recent elevated liver enzymes                        (now improved) Patient Profile:      Refer to note in patient chart for documentation of                        history and physical. Providers:            Murray Hodgkins. Abdel Effinger Referring MD:         Jonathon Bellows MD, MD (Referring MD), Venia Carbon                        (Referring MD) Medicines:            Propofol per Anesthesia Complications:        No immediate complications. Procedure:            Pre-Anesthesia Assessment:                       Prior to the procedure, a History and Physical was                        performed, and patient medications and allergies were                        reviewed. The patient is competent. The risks and                        benefits of the procedure and the sedation options and                        risks were discussed with the patient. All questions                        were answered and informed consent was obtained.                        Patient identification and proposed procedure were                        verified by the physician, the nurse and the                        anesthetist in the pre-procedure area. Mental Status                        Examination: alert and oriented. Airway Examination:                        normal oropharyngeal airway and neck mobility.  Respiratory Examination: clear to auscultation. CV                        Examination: normal. Prophylactic Antibiotics: The                        patient does not  require prophylactic antibiotics.                        Prior Anticoagulants: The patient has taken no previous                        anticoagulant or antiplatelet agents. ASA Grade                        Assessment: III - A patient with severe systemic                        disease. After reviewing the risks and benefits, the                        patient was deemed in satisfactory condition to undergo                        the procedure. The anesthesia plan was to use monitored                        anesthesia care (MAC). Immediately prior to                        administration of medications, the patient was                        re-assessed for adequacy to receive sedatives. The                        heart rate, respiratory rate, oxygen saturations, blood                        pressure, adequacy of pulmonary ventilation, and                        response to care were monitored throughout the                        procedure. The physical status of the patient was                        re-assessed after the procedure.                       After obtaining informed consent, the endoscope was                        passed under direct vision. Throughout the procedure,                        the patient's blood pressure, pulse, and oxygen                        saturations were monitored continuously.The  upper EUS                        was accomplished without difficulty. The patient                        tolerated the procedure well. The Endoscope was                        introduced through the mouth, and advanced to the                        second part of duodenum. The Endoscope was introduced                        through the mouth, and advanced to the duodenum for                        ultrasound examination from the esophagus, stomach and                        duodenum. Findings:      Endoscopic Finding :      The entire examined stomach was endoscopically  normal.      The duodenal bulb was normal.      A medium diverticulum was found in the area of the papilla.      The major ampulla was located on the rim of the diverticulum and normal       in appearance.      Endosonographic Finding :      One stone was visualized endosonographically in the common bile duct.       The stone measured 6 mm in greatest dimension. The stone was round. It       was hyperechoic and characterized by shadowing. The CHD measured 4.2 mm       and the CBD measured 5.1 mm.      A few stones were visualized endosonographically in the gallbladder.      There was no sign of significant endosonographic abnormality in the       ampulla.      There was no sign of significant endosonographic parenchymal or ductal       abnormality in the entire pancreas. The pancreatic duct measured 2.1 mm       in the head, 1.7 mm in the neck, 1.1 mm in the body, and 0.7 mm in the       tail.      Endosonographic imaging in the left lobe of the liver showed no       abnormalities.      The region of the celiac plexus and celiac ganglia was visualized and       showed no sign of significant endosonographic abnormality. Impression:           EGD Impressions:                       - Normal stomach.                       - Normal duodenal bulb.                       - Duodenal diverticulum with the major ampulla located  on the rim of the diverticulum.                       EUS Impressions:                       - One stone was visualized endosonographically in the                        common bile duct.                       - A few stones were visualized endosonographically in                        the gallbladder.                       - There was no sign of significant pathology in the                        ampulla.                       - There was no sign of significant pathology in the                        entire pancreas.                       -  Normal visualized portions of the liver.                       - Normal celiac region.                       - No specimens collected. Recommendation:       - Discharge patient to home (ambulatory).                       - Perform an ERCP at appointment to be scheduled for                        definitive stone removal.                       - Refer to a surgeon for consideration of                        cholecystectomy.                       - The findings and recommendations were discussed with                        the patient and her family.                       - The findings and recommendations were discussed with                        the referring GI physician, Dr. Vicente Males. He will arrange  the follow-up ERCP. Procedure Code(s):    --- Professional ---                       [phone], Esophagogastroduodenoscopy, flexible, transoral;                        with endoscopic ultrasound examination limited to the                        esophagus, stomach or duodenum, and adjacent structures Diagnosis Code(s):    --- Professional ---                       R93.5, Abnormal findings on diagnostic imaging of other                        abdominal regions, including retroperitoneum                       R74.8, Abnormal levels of other serum enzymes                       K80.50, Calculus of bile duct without cholangitis or                        cholecystitis without obstruction                       K80.20, Calculus of gallbladder without cholecystitis                        without obstruction                       K57.10, Diverticulosis of small intestine without                        perforation or abscess without bleeding CPT copyright 2016 American [employer] Association. All rights reserved. The codes documented in this report are preliminary and upon coder review may  be revised to meet current compliance requirements. Attending Participation:      I personally  performed the entire procedure without the assistance of a       fellow, resident or surgical assistant. [employer],  03/13/2017 12:03:26 PM This report has been signed electronically. Number of Addenda: 0 Note Initiated On: 03/13/2017 11:23 AM Estimated Blood Loss: Estimated blood loss: none.      [facility]

## 2017-03-13 NOTE — Anesthesia Post-op Follow-up Note (Cosign Needed)
Anesthesia QCDR form completed.        

## 2017-03-14 ENCOUNTER — Other Ambulatory Visit: Payer: Self-pay

## 2017-03-14 ENCOUNTER — Telehealth: Payer: Self-pay

## 2017-03-14 ENCOUNTER — Telehealth: Payer: Self-pay | Admitting: Gastroenterology

## 2017-03-14 DIAGNOSIS — K831 Obstruction of bile duct: Secondary | ICD-10-CM

## 2017-03-14 NOTE — Telephone Encounter (Signed)
03/14/17 Faxed Prior Auth for to [facility] for ERCP 01601 / K83.1

## 2017-03-14 NOTE — Telephone Encounter (Signed)
Advised pt ERCP scheduled for Monday, 5/21.  NPO 8 hrs prior.   [place].  Contact [facility] scheduling for exact time; gave Trish's phone number.

## 2017-03-17 ENCOUNTER — Ambulatory Visit: Payer: PPO | Admitting: Certified Registered Nurse Anesthetist

## 2017-03-17 ENCOUNTER — Encounter
Admission: RE | Disposition: A | Discharge status: Home / Self Care | Payer: Self-pay | Source: Ambulatory Visit | Attending: Gastroenterology

## 2017-03-17 ENCOUNTER — Ambulatory Visit: Payer: PPO | Admitting: Pulmonary Disease

## 2017-03-17 ENCOUNTER — Encounter: Payer: Self-pay | Admitting: *Deleted

## 2017-03-17 ENCOUNTER — Ambulatory Visit: Payer: PPO

## 2017-03-17 ENCOUNTER — Ambulatory Visit
Admission: RE | Admit: 2017-03-17 | Discharge: 2017-03-17 | Disposition: A | Discharge status: Home / Self Care | Payer: PPO | Source: Ambulatory Visit | Attending: Gastroenterology | Admitting: Gastroenterology

## 2017-03-17 DIAGNOSIS — Z7982 Long term (current) use of aspirin: Secondary | ICD-10-CM | POA: Diagnosis not present

## 2017-03-17 DIAGNOSIS — K831 Obstruction of bile duct: Secondary | ICD-10-CM

## 2017-03-17 DIAGNOSIS — I10 Essential (primary) hypertension: Secondary | ICD-10-CM | POA: Diagnosis not present

## 2017-03-17 DIAGNOSIS — E785 Hyperlipidemia, unspecified: Secondary | ICD-10-CM | POA: Diagnosis not present

## 2017-03-17 DIAGNOSIS — K589 Irritable bowel syndrome without diarrhea: Secondary | ICD-10-CM | POA: Insufficient documentation

## 2017-03-17 DIAGNOSIS — Z794 Long term (current) use of insulin: Secondary | ICD-10-CM | POA: Diagnosis not present

## 2017-03-17 DIAGNOSIS — I2510 Atherosclerotic heart disease of native coronary artery without angina pectoris: Secondary | ICD-10-CM | POA: Insufficient documentation

## 2017-03-17 DIAGNOSIS — E1142 Type 2 diabetes mellitus with diabetic polyneuropathy: Secondary | ICD-10-CM | POA: Diagnosis not present

## 2017-03-17 DIAGNOSIS — K219 Gastro-esophageal reflux disease without esophagitis: Secondary | ICD-10-CM | POA: Insufficient documentation

## 2017-03-17 DIAGNOSIS — Z9981 Dependence on supplemental oxygen: Secondary | ICD-10-CM | POA: Insufficient documentation

## 2017-03-17 DIAGNOSIS — Z79899 Other long term (current) drug therapy: Secondary | ICD-10-CM | POA: Insufficient documentation

## 2017-03-17 DIAGNOSIS — Z87891 Personal history of nicotine dependence: Secondary | ICD-10-CM | POA: Insufficient documentation

## 2017-03-17 DIAGNOSIS — J449 Chronic obstructive pulmonary disease, unspecified: Secondary | ICD-10-CM | POA: Insufficient documentation

## 2017-03-17 DIAGNOSIS — Q9389 Other deletions from the autosomes: Secondary | ICD-10-CM

## 2017-03-17 DIAGNOSIS — K8050 Calculus of bile duct without cholangitis or cholecystitis without obstruction: Secondary | ICD-10-CM | POA: Diagnosis not present

## 2017-03-17 DIAGNOSIS — F329 Major depressive disorder, single episode, unspecified: Secondary | ICD-10-CM | POA: Insufficient documentation

## 2017-03-17 HISTORY — PX: ERCP: SHX5425

## 2017-03-17 LAB — GLUCOSE, CAPILLARY
GLUCOSE-CAPILLARY: 147 mg/dL — AB (ref 65–99)
GLUCOSE-CAPILLARY: 124 mg/dL — AB (ref 65–99)

## 2017-03-17 SURGERY — ERCP, WITH INTERVENTION IF INDICATED
Anesthesia: General

## 2017-03-17 MED ORDER — FENTANYL CITRATE (PF) 100 MCG/2ML IJ SOLN
INTRAMUSCULAR | Status: DC | PRN
  Administered 2017-03-17 (×2): 50 ug via INTRAVENOUS

## 2017-03-17 MED ORDER — EPHEDRINE SULFATE 50 MG/ML IJ SOLN
INTRAMUSCULAR | Status: DC | PRN
Start: 2017-03-17 — End: 2017-03-17
  Administered 2017-03-17 (×2): 5 mg via INTRAVENOUS

## 2017-03-17 MED ORDER — PROPOFOL 10 MG/ML IV BOLUS: INTRAVENOUS | Status: DC | PRN

## 2017-03-17 MED ORDER — SODIUM CHLORIDE 0.9 % IV SOLN
INTRAVENOUS | Status: DC
  Administered 2017-03-17: 12:00:00 via INTRAVENOUS

## 2017-03-17 MED ORDER — SUCCINYLCHOLINE CHLORIDE 20 MG/ML IJ SOLN
INTRAMUSCULAR | Status: DC | PRN
  Administered 2017-03-17: 100 mg via INTRAVENOUS

## 2017-03-17 MED ORDER — PROPOFOL 10 MG/ML IV BOLUS
INTRAVENOUS | Status: DC | PRN
  Administered 2017-03-17: 150 mg via INTRAVENOUS

## 2017-03-17 MED ORDER — PROPOFOL 10 MG/ML IV BOLUS
INTRAVENOUS | Status: AC
  Filled 2017-03-17: qty 20

## 2017-03-17 MED ORDER — SUCCINYLCHOLINE CHLORIDE 20 MG/ML IJ SOLN
INTRAMUSCULAR | Status: AC
  Filled 2017-03-17: qty 1

## 2017-03-17 MED ORDER — PHENYLEPHRINE HCL 10 MG/ML IJ SOLN
INTRAMUSCULAR | Status: DC | PRN
  Administered 2017-03-17: 100 ug via INTRAVENOUS

## 2017-03-17 MED ORDER — MIDAZOLAM HCL 2 MG/2ML IJ SOLN
INTRAMUSCULAR | Status: AC
  Filled 2017-03-17: qty 2

## 2017-03-17 MED ORDER — INDOMETHACIN 50 MG RE SUPP: 100.0000 mg | Freq: Once | RECTAL | Status: AC

## 2017-03-17 MED ORDER — INDOMETHACIN 50 MG RE SUPP
100.0000 mg | Freq: Once | RECTAL | Status: AC
  Administered 2017-03-17: 100 mg via RECTAL

## 2017-03-17 MED ORDER — ONDANSETRON HCL 4 MG/2ML IJ SOLN
INTRAMUSCULAR | Status: DC | PRN
  Administered 2017-03-17: 4 mg via INTRAVENOUS

## 2017-03-17 MED ORDER — LIDOCAINE HCL 2 % IJ SOLN
INTRAMUSCULAR | Status: AC
  Filled 2017-03-17: qty 10

## 2017-03-17 MED ORDER — ONDANSETRON HCL 4 MG/2ML IJ SOLN
INTRAMUSCULAR | Status: AC
  Filled 2017-03-17: qty 2

## 2017-03-17 MED ORDER — FENTANYL CITRATE (PF) 100 MCG/2ML IJ SOLN
INTRAMUSCULAR | Status: AC
  Filled 2017-03-17: qty 2

## 2017-03-17 MED ORDER — MIDAZOLAM HCL 2 MG/2ML IJ SOLN
INTRAMUSCULAR | Status: DC | PRN
  Administered 2017-03-17: 1 mg via INTRAVENOUS

## 2017-03-17 NOTE — Anesthesia Post-op Follow-up Note (Cosign Needed)
Anesthesia QCDR form completed.        

## 2017-03-17 NOTE — Anesthesia Postprocedure Evaluation (Signed)
Anesthesia Post Note  Patient: Lindsay Harmon  Procedure(s) Performed: Procedure(s) (LRB): ENDOSCOPIC RETROGRADE CHOLANGIOPANCREATOGRAPHY (ERCP) (N/A)  Patient location during evaluation: Endoscopy Anesthesia Type: General Level of consciousness: awake and alert Pain management: pain level controlled Vital Signs Assessment: post-procedure vital signs reviewed and stable Respiratory status: spontaneous breathing and respiratory function stable Cardiovascular status: stable Anesthetic complications: no     Last Vitals:  Vitals:   03/17/17 1336 03/17/17 1345  BP: 123/66 (!) 116/96  Pulse: 100 90  Resp: 19 12  Temp: 36.6 C     Last Pain:  Vitals:   03/17/17 1345  TempSrc:   PainSc: 0-No pain                 Fredonia Casalino K

## 2017-03-17 NOTE — Anesthesia Procedure Notes (Signed)
Procedure Name: Intubation Date/Time: 03/17/2017 12:42 PM Performed by: Hedda Slade Pre-anesthesia Checklist: Patient identified, Patient being monitored, Timeout performed, Emergency Drugs available and Suction available Patient Re-evaluated:Patient Re-evaluated prior to inductionOxygen Delivery Method: Circle system utilized Preoxygenation: Pre-oxygenation with 100% oxygen Intubation Type: IV induction Ventilation: Mask ventilation without difficulty Laryngoscope Size: Mac and 3 Grade View: Grade I Tube type: Oral Tube size: 7.0 mm Number of attempts: 1 Airway Equipment and Method: Stylet Placement Confirmation: ETT inserted through vocal cords under direct vision,  positive ETCO2 and breath sounds checked- equal and bilateral Secured at: 21 cm Tube secured with: Tape Dental Injury: Teeth and Oropharynx as per pre-operative assessment

## 2017-03-17 NOTE — Anesthesia Preprocedure Evaluation (Addendum)
Anesthesia Evaluation  Patient identified by MRN, date of birth, ID band Patient awake    Reviewed: Allergy & Precautions, NPO status , Patient's Chart, lab work & pertinent test results  History of Anesthesia Complications Negative for: history of anesthetic complications  Airway Mallampati: II       Dental  (+) Upper Dentures, Loose   Pulmonary shortness of breath, COPD,  COPD inhaler and oxygen dependent, former smoker,           Cardiovascular hypertension, Pt. on medications + CAD       Neuro/Psych Depression  Neuromuscular disease (peripheral neuroopathy)    GI/Hepatic GERD  Medicated,  Endo/Other  diabetes, Type 2, Insulin Dependent, Oral Hypoglycemic Agents  Renal/GU Renal InsufficiencyRenal disease     Musculoskeletal   Abdominal   Peds  Hematology   Anesthesia Other Findings   Reproductive/Obstetrics                             Anesthesia Physical Anesthesia Plan  ASA: III  Anesthesia Plan: General   Post-op Pain Management:    Induction: Intravenous  Airway Management Planned: Oral ETT  Additional Equipment:   Intra-op Plan:   Post-operative Plan:   Informed Consent: I have reviewed the patients History and Physical, chart, labs and discussed the procedure including the risks, benefits and alternatives for the proposed anesthesia with the patient or authorized representative who has indicated his/her understanding and acceptance.     Plan Discussed with:   Anesthesia Plan Comments: (Spoke with pt about the possibility of needing to remain intubated following the procedure. This is not the plan, but a risk. Pt expressed understanding and desire to proceed. )       Anesthesia Quick Evaluation

## 2017-03-17 NOTE — Op Note (Signed)
[facility] Patient Name: Lindsay Harmon Procedure Date: 03/17/2017 12:34 PM MRN: [id number] Account #: [id number] Date of Birth: Sep 09, 1956 Admit Type: Outpatient Age: 61 Room: [facility] ENDO ROOM 4 Gender: Female Note Status: Finalized Procedure:            ERCP Indications:          Bile duct stone(s) Providers:            [name] MD, MD Referring MD:         Venia Carbon (Referring MD) Medicines:            General Anesthesia Complications:        No immediate complications. Procedure:            Pre-Anesthesia Assessment:                       - Prior to the procedure, a History and Physical was                        performed, and patient medications and allergies were                        reviewed. The patient's tolerance of previous                        anesthesia was also reviewed. The risks and benefits of                        the procedure and the sedation options and risks were                        discussed with the patient. All questions were                        answered, and informed consent was obtained. Prior                        Anticoagulants: The patient has taken no previous                        anticoagulant or antiplatelet agents. ASA Grade                        Assessment: II - A patient with mild systemic disease.                        After reviewing the risks and benefits, the patient was                        deemed in satisfactory condition to undergo the                        procedure.                       After obtaining informed consent, the scope was passed                        under direct vision. Throughout the procedure, the  patient's blood pressure, pulse, and oxygen saturations                        were monitored continuously. The Endoscope was                        introduced through the mouth, and used to inject                        contrast into and  used for direct visualization of the                        bile duct and ventral pancreatic duct. The ERCP was                        accomplished without difficulty. The patient tolerated                        the procedure well. Findings:      The scout film was normal. The major papilla was on the rim of a       diverticulum. A wire was passed into the ventral pancreatic duct. A wire       was passed into the biliary tree. The bile duct was deeply cannulated       with the short-nosed traction sphincterotome. Contrast was injected. I       personally interpreted the bile duct images. There was brisk flow of       contrast through the ducts. Image quality was excellent. Contrast       extended to the bifurcation. The middle third of the main bile duct       contained one stone mm. Biliary sphincterotomy was made with a traction       (standard) sphincterotome using ERBE electrocautery. There was no       post-sphincterotomy bleeding. The biliary tree was swept with a 15 mm       balloon starting at the bifurcation. One stone was removed. No stones       remained. Impression:           - The major papilla was on the rim of a diverticulum.                       - Choledocholithiasis was found. Complete removal was                        accomplished by biliary sphincterotomy and balloon                        extraction.                       - A biliary sphincterotomy was performed.                       - The biliary tree was swept. Recommendation:       - Clear liquid diet for 1 day.                       - Watch for pancreatitis, bleeding, perforation, and  cholangitis. Procedure Code(s):    --- Professional ---                       [id number], Endoscopic retrograde cholangiopancreatography                        (ERCP); with removal of calculi/debris from                        biliary/pancreatic duct(s)                       43262, Endoscopic retrograde  cholangiopancreatography                        (ERCP); with sphincterotomy/papillotomy                       [phone], Endoscopic catheterization of the biliary ductal                        system, radiological supervision and interpretation Diagnosis Code(s):    --- Professional ---                       K80.50, Calculus of bile duct without cholangitis or                        cholecystitis without obstruction CPT copyright 2016 American Medical Association. All rights reserved. The codes documented in this report are preliminary and upon coder review may  be revised to meet current compliance requirements. [name] MD, MD 03/17/2017 1:24:16 PM This report has been signed electronically. Number of Addenda: 0 Note Initiated On: 03/17/2017 12:34 PM      [facility]

## 2017-03-17 NOTE — H&P (Signed)
Lindsay Lame, MD [address] [place], [place] [address] Phone:[phone] Fax : [phone]  Primary Care Physician:  Venia Carbon, MD Primary Gastroenterologist:  Dr. Allen Norris  Pre-Procedure History & Physical: HPI:  Lindsay Harmon is a 61 y.o. female is here for an ERCP.   Past Medical History:  Diagnosis Date  . Allergy   . Arthritis   . CAD (coronary artery disease)   . COPD (chronic obstructive pulmonary disease) ([place])   . Depression   . Esophageal stricture   . Familial hematuria   . GERD (gastroesophageal reflux disease)   . Hyperlipidemia   . Hypertension   . IBS (irritable bowel syndrome)   . Nephrolithiasis   . NIDDM (non-insulin dependent diabetes mellitus)    with neuropathy  . Obesity, unspecified   . Pulmonary hypertension ([place])   . PVD (peripheral vascular disease) ([place])     Past Surgical History:  Procedure Laterality Date  . ABDOMINAL HYSTERECTOMY    . CARPAL TUNNEL RELEASE    . CESAREAN SECTION    . EUS N/A 03/13/2017   Procedure: FULL UPPER ENDOSCOPIC ULTRASOUND (EUS) RADIAL;  Surgeon: Burbridge, Murray Hodgkins, MD;  Location: [facility] ENDOSCOPY;  Service: Endoscopy;  Laterality: N/A;  . RIGHT HEART CATHETERIZATION N/A 12/05/2014   Procedure: RIGHT HEART CATH;  Surgeon: Sinclair Grooms, MD;  Location: [facility] CATH LAB;  Service: Cardiovascular;  Laterality: N/A;    Prior to Admission medications   Medication Sig Start Date End Date Taking? Authorizing Provider  albuterol (PROVENTIL HFA;VENTOLIN HFA) 108 (90 Base) MCG/ACT inhaler Inhale 2 puffs into the lungs every 6 (six) hours as needed for wheezing or shortness of breath. 02/07/16  Yes Juanito Doom, MD  ALPRAZolam Duanne Moron) 0.25 MG tablet Take 0.25 mg by mouth 2 (two) times daily.    Yes [provider]  aspirin 325 MG tablet Take 325 mg by mouth at bedtime.    Yes [provider]  calcitRIOL (ROCALTROL) 0.25 MCG capsule Take 0.25 mcg by mouth daily.   Yes [provider]  calcitRIOL (ROCALTROL) 0.5 MCG capsule  02/03/17  Yes [provider]  cholecalciferol (VITAMIN D) 1000 units tablet Take 1,000 Units by mouth daily.   Yes [provider]  enalapril (VASOTEC) 5 MG tablet Take 1 tablet (5 mg total) by mouth daily. 01/29/17  Yes Fritzi Mandes, MD  gabapentin (NEURONTIN) 300 MG capsule Pt takes one capsule in the morning, one at noon, and two at bedtime. 07/23/16  Yes Venia Carbon, MD  glipiZIDE (GLUCOTROL) 5 MG tablet Take 5 mg by mouth daily before breakfast.   Yes [provider]  Insulin Glargine (LANTUS) 100 UNIT/ML Solostar Pen INJECT 10 TO 30 UNITS INTO SKIN DAILY 03/04/17  Yes Venia Carbon, MD  loratadine (CLARITIN) 10 MG tablet Take 10 mg by mouth 2 (two) times daily.    Yes [provider]  lovastatin (MEVACOR) 40 MG tablet Take 80 mg by mouth at bedtime.   Yes [provider]  methadone (DOLOPHINE) 10 MG tablet Take 2 tablets (20 mg total) by mouth 4 (four) times daily. 02/05/17  Yes Venia Carbon, MD  metoprolol tartrate (LOPRESSOR) 25 MG tablet Take 0.5 tablets (12.5 mg total) by mouth 2 (two) times daily. 12/27/15  Yes Larey Dresser, MD  mirtazapine (REMERON) 30 MG tablet Take 15 mg by mouth at bedtime.   Yes [provider]  nitroGLYCERIN (NITROSTAT) 0.4 MG SL tablet Place 1  tablet (0.4 mg total) under the tongue every 5 (five) minutes as needed for chest pain. 07/23/16  Yes Venia Carbon, MD  ONE TOUCH ULTRA TEST test strip USE 1 STRIP TO TEST BLOOD SUGAR ONCE DAILY 06/17/16  Yes Venia Carbon, MD  pantoprazole (PROTONIX) 40 MG tablet Take 1 tablet (40 mg total) by mouth 2 (two) times daily. 02/03/17  Yes Venia Carbon, MD  potassium chloride SA (K-DUR,KLOR-CON) 20 MEQ tablet Take 1 tablet by mouth 2 (two) times daily. 02/06/17  Yes [provider]  SURE COMFORT PEN NEEDLES 31G X 5 MM [place]  12/25/16  Yes [provider]  Tiotropium Bromide-Olodaterol  (STIOLTO RESPIMAT) 2.5-2.5 MCG/ACT AERS Inhale 2 puffs into the lungs daily. 09/26/16  Yes Juanito Doom, MD  torsemide (DEMADEX) 20 MG tablet Take 1 tablet (20 mg total) by mouth daily. 01/29/17  Yes Fritzi Mandes, MD  amoxicillin (AMOXIL) 875 MG tablet  02/20/17   [provider]  fludrocortisone (FLORINEF) 0.1 MG tablet Take 0.1 mg by mouth daily.    [provider]    Allergies as of 03/14/2017 - Review Complete 03/13/2017  Allergen Reaction Noted  . Sertraline hcl Other (See Comments) 05/29/2016    Family History  Problem Relation Age of Onset  . Diabetes Mother   . Cancer Mother        ovarian,melanoma  . Allergies Father   . Cancer Father        lung  . Cancer Maternal Grandmother        uterine  . Emphysema Paternal Grandfather   . Allergies Sister   . Breast cancer Neg Hx     Social History   Social History  . Marital status: Legally Separated    Spouse name: N/A  . Number of children: 1  . Years of education: N/A   Occupational History  . 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 Smoking status: Former Smoker    Packs/day: 1.50    Years: 20.00    Types: Cigarettes    Quit date: 01/04/2009  . Smokeless tobacco: Never Used  . Alcohol use No     Comment: heavy in the past  . Drug use: No  . Sexual activity: No   Other Topics Concern  . Not on file   Social History Narrative   No living will   Son [name] should make decisions for her if she is unable.    Would accept resuscitation attempts but no prolonged ventilation   Not sure about tube feeds but probably wouldn't want them if cognitively unaware    Review of Systems: See HPI, otherwise negative ROS  Physical Exam: BP 126/63   Pulse 77   Temp 98 F (36.7 C) (Tympanic)   Resp 20   Ht _0  (1.575 m)   Wt 157 lb (71.2 kg)   SpO2 97%   BMI 28.72 kg/m  General:   Alert,  pleasant and cooperative in NAD Head:  Normocephalic and  atraumatic. Neck:  Supple; no masses or thyromegaly. Lungs:  Clear throughout to auscultation.    Heart:  Regular rate and rhythm. Abdomen:  Soft, nontender and nondistended. Normal bowel sounds, without guarding, and without rebound.   Neurologic:  Alert and  oriented x4;  grossly normal neurologically.  Impression/Plan: Lindsay Harmon is here for an ERCP to be performed for CBD stone  Risks, benefits, limitations, and alternatives regarding  ERCP have  been reviewed with the patient.  Questions have been answered.  All parties agreeable.   Lindsay Lame, MD  03/17/2017, 1:25 PM

## 2017-03-17 NOTE — Transfer of Care (Signed)
Immediate Anesthesia Transfer of Care Note  Patient: Lindsay Harmon  Procedure(s) Performed: Procedure(s): ENDOSCOPIC RETROGRADE CHOLANGIOPANCREATOGRAPHY (ERCP) (N/A)  Patient Location: PACU  Anesthesia Type:General  Level of Consciousness: awake, alert  and oriented  Airway & Oxygen Therapy: Patient Spontanous Breathing and Patient connected to face mask oxygen  Post-op Assessment: Report given to RN and Post -op Vital signs reviewed and stable  Post vital signs: Reviewed and stable  Last Vitals:  Vitals:   03/17/17 1152 03/17/17 1336  BP: 126/63 123/66  Pulse: 77 100  Resp: 20 19  Temp: 36.7 C 36.6 C    Last Pain:  Vitals:   03/17/17 1336  TempSrc: Temporal         Complications: No apparent anesthesia complications

## 2017-03-18 ENCOUNTER — Ambulatory Visit: Payer: PPO | Admitting: Pulmonary Disease

## 2017-03-20 ENCOUNTER — Other Ambulatory Visit: Payer: Self-pay | Admitting: Internal Medicine

## 2017-03-20 ENCOUNTER — Encounter: Payer: Self-pay | Admitting: Gastroenterology

## 2017-03-20 DIAGNOSIS — G8929 Other chronic pain: Secondary | ICD-10-CM

## 2017-03-20 NOTE — Telephone Encounter (Signed)
I've never prescribed methadone and do not feel comfortable. Would you mind refilling this for Rich while he's out?

## 2017-03-20 NOTE — Telephone Encounter (Signed)
Pt request rx methadone. Call when ready for pick up. Last printed # 240 on 02/05/17. Last seen 02/18/17. Pt also has urgency with pressure feeling upon urination and voiding small amts. Pt did not want to schedule at another [facility] site and will go to [facility]. Dr Silvio Pate out of office.

## 2017-03-21 ENCOUNTER — Other Ambulatory Visit (INDEPENDENT_AMBULATORY_CARE_PROVIDER_SITE_OTHER): Payer: PPO

## 2017-03-21 ENCOUNTER — Other Ambulatory Visit: Payer: Self-pay

## 2017-03-21 ENCOUNTER — Ambulatory Visit (INDEPENDENT_AMBULATORY_CARE_PROVIDER_SITE_OTHER): Payer: PPO | Admitting: Pulmonary Disease

## 2017-03-21 ENCOUNTER — Encounter: Payer: Self-pay | Admitting: Pulmonary Disease

## 2017-03-21 ENCOUNTER — Encounter: Payer: Self-pay | Admitting: Internal Medicine

## 2017-03-21 VITALS — BP 124/62 | HR 89 | Ht 61.0 in | Wt 158.0 lb

## 2017-03-21 DIAGNOSIS — G8929 Other chronic pain: Secondary | ICD-10-CM | POA: Diagnosis not present

## 2017-03-21 DIAGNOSIS — F17210 Nicotine dependence, cigarettes, uncomplicated: Secondary | ICD-10-CM

## 2017-03-21 DIAGNOSIS — R300 Dysuria: Secondary | ICD-10-CM

## 2017-03-21 DIAGNOSIS — M5440 Lumbago with sciatica, unspecified side: Secondary | ICD-10-CM | POA: Diagnosis not present

## 2017-03-21 LAB — URINALYSIS, ROUTINE W REFLEX MICROSCOPIC
Bilirubin Urine: NEGATIVE
KETONES UR: NEGATIVE
NITRITE: POSITIVE — AB
Total Protein, Urine: NEGATIVE
UROBILINOGEN UA: 0.2 (ref 0.0–1.0)
Urine Glucose: 100 — AB
pH: 6 (ref 5.0–8.0)

## 2017-03-21 MED ORDER — CIPROFLOXACIN HCL 250 MG PO TABS: 250.0000 mg | ORAL_TABLET | Freq: Two times a day (BID) | ORAL | 0 refills | Status: DC

## 2017-03-21 MED ORDER — METHADONE HCL 10 MG PO TABS: 20.0000 mg | ORAL_TABLET | Freq: Four times a day (QID) | ORAL | 0 refills | Status: DC

## 2017-03-21 NOTE — Assessment & Plan Note (Signed)
Stable interval. Continue 2 L of oxygen at rest, 3 L pulse with exertion and at sleep.

## 2017-03-21 NOTE — Telephone Encounter (Signed)
Printed and in [employer] box I don't see recent UDS (last 11/2014).  Last EKG 01/2017 Last [place] CSRS 01/2017 [facility] PCP fyi.

## 2017-03-21 NOTE — Assessment & Plan Note (Signed)
She smoked 2 packs of cigarettes daily for 40 years and quit in 2013. She is at increased risk for lung cancer. Today we talked about this and she is interested in lung cancer screening. We will refer to the lung cancer screening program in [place].

## 2017-03-21 NOTE — Progress Notes (Signed)
Subjective:    Patient ID: Lindsay Harmon, female    DOB: 1956-01-22, 61 y.o.   MRN: [id number]   Synopsis: Referred by cardiology in 2016 for evaluation of severe COPD and possible obstructive sleep apnea in the setting of pulmonary hypertension. She also has congestive heart failure. A sleep study in 2016 showed no evidence of obstructive sleep apnea but she does have significant nocturnal hypoxemia. Spirometry testing in 2016 confirmed a diagnosis of COPD with severe airflow obstruction FEV1 41% pred.  She smoked cigarettes for many years and she quit smoking in 2013 after smoking 2 ppd for 40 years.   HPI Chief Complaint  Patient presents with  . Follow-up    pt states she is doing well today, no complaints at this time.    [name] had an ERCP earlier this week for a common bile duct stone.  She says that the stone came out OK without difficulty.  She had no abdominal pain or anything while this was going on.   She is doing well since then.  She has had no breathing difficulty during this week or with the procedure.  Apparently her blood pressure was a little low during the procedure.  She continues to use and benefit from her oxygen.  No bronchitis or pneumonia this year. She got a flu shot.  She "steered clear" of sick folks.    No cough.    Stiolto every morning, no problems with it.  She has had dysuria and urgency since.   Past Medical History:  Diagnosis Date  . Allergy   . Arthritis   . CAD (coronary artery disease)   . COPD (chronic obstructive pulmonary disease) ([place])   . Depression   . Esophageal stricture   . Familial hematuria   . GERD (gastroesophageal reflux disease)   . Hyperlipidemia   . Hypertension   . IBS (irritable bowel syndrome)   . Nephrolithiasis   . NIDDM (non-insulin dependent diabetes mellitus)    with neuropathy  . Obesity, unspecified   . Pulmonary hypertension ([place])   . PVD (peripheral vascular disease) ([place])       Review of  Systems  Constitutional: Positive for fatigue. Negative for diaphoresis and fever.  HENT: Negative for postnasal drip, rhinorrhea and sinus pressure.   Respiratory: Negative for cough, shortness of breath and wheezing.   Cardiovascular: Negative for chest pain, palpitations and leg swelling.       Objective:   Physical Exam  Vitals:   03/21/17 1026  BP: 124/62  Pulse: 89  SpO2: 93%  Weight: 158 lb (71.7 kg)  Height: _0  (1.549 m)  3L [place] pulse  Gen: chronically ill appearing HENT: OP clear, TM's clear, neck supple PULM: CTA B, normal percussion CV: RRR, no mgr, trace edema GI: BS+, soft, nontender Derm: no cyanosis or rash Psyche: normal mood and affect   CBC    Component Value Date/Time   WBC 9.3 02/26/2017 1452   RBC 4.94 02/26/2017 1452   HGB 11.6 (L) 02/26/2017 1452   HCT 36.4 02/26/2017 1452   PLT 412 02/26/2017 1452   MCV 73.7 (L) 02/26/2017 1452   MCH 23.4 (L) 02/26/2017 1452   MCHC 31.8 (L) 02/26/2017 1452   RDW 20.0 (H) 02/26/2017 1452   LYMPHSABS 2.3 02/26/2017 1452   MONOABS 1.0 (H) 02/26/2017 1452   EOSABS 0.3 02/26/2017 1452   BASOSABS 0.1 02/26/2017 1452    Records reviewed from this weeks ERCP for a common bile duct  stone.    Assessment & Plan:   Dysuria New problem. Occurred since her procedure earlier this week.  Plan: Urinalysis  COPD (chronic obstructive pulmonary disease) (HCC) Stable interval for her on Stiolto. Flu shot is up-to-date. No exacerbations.  Plan: Continue Stiolto  Cigarette smoker She smoked 2 packs of cigarettes daily for 40 years and quit in 2013. She is at increased risk for lung cancer. Today we talked about this and she is interested in lung cancer screening. We will refer to the lung cancer screening program in [place].  Chronic respiratory failure with hypoxia (HCC) Stable interval. Continue 2 L of oxygen at rest, 3 L pulse with exertion and at sleep.   Updated Medication List Outpatient Encounter  Prescriptions as of 03/21/2017  Medication Sig  . albuterol (PROVENTIL HFA;VENTOLIN HFA) 108 (90 Base) MCG/ACT inhaler Inhale 2 puffs into the lungs every 6 (six) hours as needed for wheezing or shortness of breath.  Marland Kitchen aspirin 325 MG tablet Take 325 mg by mouth at bedtime.   . calcitRIOL (ROCALTROL) 0.5 MCG capsule   . cholecalciferol (VITAMIN D) 1000 units tablet Take 1,000 Units by mouth daily.  . enalapril (VASOTEC) 5 MG tablet Take 1 tablet (5 mg total) by mouth daily.  Marland Kitchen gabapentin (NEURONTIN) 300 MG capsule Pt takes one capsule in the morning, one at noon, and two at bedtime.  Marland Kitchen glipiZIDE (GLUCOTROL) 5 MG tablet Take 5 mg by mouth daily before breakfast.  . Insulin Glargine (LANTUS) 100 UNIT/ML Solostar Pen INJECT 10 TO 30 UNITS INTO SKIN DAILY  . loratadine (CLARITIN) 10 MG tablet Take 10 mg by mouth 2 (two) times daily.   Marland Kitchen lovastatin (MEVACOR) 40 MG tablet Take 80 mg by mouth at bedtime.  . methadone (DOLOPHINE) 10 MG tablet Take 2 tablets (20 mg total) by mouth 4 (four) times daily.  . metoprolol tartrate (LOPRESSOR) 25 MG tablet Take 0.5 tablets (12.5 mg total) by mouth 2 (two) times daily.  . mirtazapine (REMERON) 30 MG tablet Take 15 mg by mouth at bedtime.  . nitroGLYCERIN (NITROSTAT) 0.4 MG SL tablet Place 1 tablet (0.4 mg total) under the tongue every 5 (five) minutes as needed for chest pain.  . ONE TOUCH ULTRA TEST test strip USE 1 STRIP TO TEST BLOOD SUGAR ONCE DAILY  . pantoprazole (PROTONIX) 40 MG tablet Take 1 tablet (40 mg total) by mouth 2 (two) times daily.  . potassium chloride SA (K-DUR,KLOR-CON) 20 MEQ tablet Take 1 tablet by mouth 2 (two) times daily.  . SURE COMFORT PEN NEEDLES 31G X 5 MM MISC USE 1 PEN NEEDLE TO INJECT INSULIN  . Tiotropium Bromide-Olodaterol (STIOLTO RESPIMAT) 2.5-2.5 MCG/ACT AERS Inhale 2 puffs into the lungs daily.  Marland Kitchen torsemide (DEMADEX) 20 MG tablet Take 1 tablet (20 mg total) by mouth daily. (Patient taking differently: Take 20 mg by mouth 2  (two) times daily. )  . [DISCONTINUED] ALPRAZolam (XANAX) 0.25 MG tablet Take 0.25 mg by mouth 2 (two) times daily.   . [DISCONTINUED] amoxicillin (AMOXIL) 875 MG tablet   . [DISCONTINUED] calcitRIOL (ROCALTROL) 0.25 MCG capsule Take 0.25 mcg by mouth daily.  . [DISCONTINUED] fludrocortisone (FLORINEF) 0.1 MG tablet Take 0.1 mg by mouth daily.   No facility-administered encounter medications on file as of 03/21/2017.

## 2017-03-21 NOTE — Assessment & Plan Note (Signed)
Stable interval for her on Stiolto. Flu shot is up-to-date. No exacerbations.  Plan: Continue Stiolto

## 2017-03-21 NOTE — Assessment & Plan Note (Signed)
New problem. Occurred since her procedure earlier this week.  Plan: Urinalysis

## 2017-03-21 NOTE — Patient Instructions (Signed)
Keep taking Stiolto as you are doing Keep using her oxygen as you're doing We will refer you to the lung cancer screening program We will call you with the results of today's urinalysis We will see you back in 6 months or sooner if needed

## 2017-03-21 NOTE — Telephone Encounter (Signed)
Spoke to pt and informed her Rx is available for pickup from the front desk. Pt advised third party unable to pickup

## 2017-03-26 ENCOUNTER — Telehealth: Payer: Self-pay | Admitting: Gastroenterology

## 2017-03-26 ENCOUNTER — Telehealth: Payer: Self-pay | Admitting: *Deleted

## 2017-03-26 ENCOUNTER — Telehealth: Payer: Self-pay

## 2017-03-26 DIAGNOSIS — Z87891 Personal history of nicotine dependence: Secondary | ICD-10-CM

## 2017-03-26 NOTE — Telephone Encounter (Signed)
Pt left v/m; pt said since 03/14/17 BS has been averaging between 200-277. Pt request cb with what to do to get BS down.

## 2017-03-26 NOTE — Telephone Encounter (Signed)
03/26/17 Fax from [place] #: [phone]

## 2017-03-26 NOTE — Telephone Encounter (Signed)
Received referral for initial lung cancer screening scan. Contacted patient and obtained smoking history,(former, quit 01/04/09, 30 pack year) as well as answering questions related to screening process. Patient denies signs of lung cancer such as weight loss or hemoptysis. Patient denies comorbidity that would prevent curative treatment if lung cancer were found. Patient is scheduled for shared decision making visit and CT scan on 04/09/17.

## 2017-03-26 NOTE — Telephone Encounter (Signed)
Spoke to pt. She said she will keep [place] updated

## 2017-03-26 NOTE — Telephone Encounter (Signed)
Confirm she is still taking 30 units of lantus daily. I think the best bet is to increase the glipizide to 28m bid I would then increase the lantus if needed to get her fasting blood sugars regularly under 170-180

## 2017-03-27 DIAGNOSIS — R601 Generalized edema: Secondary | ICD-10-CM | POA: Diagnosis not present

## 2017-03-27 DIAGNOSIS — R809 Proteinuria, unspecified: Secondary | ICD-10-CM | POA: Diagnosis not present

## 2017-03-27 DIAGNOSIS — R600 Localized edema: Secondary | ICD-10-CM | POA: Diagnosis not present

## 2017-03-27 DIAGNOSIS — E871 Hypo-osmolality and hyponatremia: Secondary | ICD-10-CM | POA: Diagnosis not present

## 2017-03-27 DIAGNOSIS — N183 Chronic kidney disease, stage 3 (moderate): Secondary | ICD-10-CM | POA: Diagnosis not present

## 2017-03-29 LAB — TOXASSURE SELECT 13 (MW), URINE

## 2017-04-06 DIAGNOSIS — I509 Heart failure, unspecified: Secondary | ICD-10-CM | POA: Diagnosis not present

## 2017-04-06 DIAGNOSIS — J449 Chronic obstructive pulmonary disease, unspecified: Secondary | ICD-10-CM | POA: Diagnosis not present

## 2017-04-08 NOTE — Telephone Encounter (Signed)
Pt left v/m; pts FBS are running between 200-231. Pt has been taking glipizide 5 mg bid and taking lantus 30 units daily; pt trying to maintain a healthy diabetic diet. Pt request what to do to get BS down. [place]. Pt request cb.

## 2017-04-08 NOTE — Telephone Encounter (Signed)
Have her increase the lantus to 35 units for 3-4 days, and then 40 units if sugars still over 180. Have her call next week if still over 180 after this increase

## 2017-04-08 NOTE — Telephone Encounter (Signed)
Spoke to pt. She will let us know if it does not get better.

## 2017-04-09 ENCOUNTER — Ambulatory Visit
Admission: RE | Admit: 2017-04-09 | Discharge: 2017-04-09 | Disposition: A | Discharge status: Home / Self Care | Payer: PPO | Source: Ambulatory Visit | Attending: Oncology | Admitting: Oncology

## 2017-04-09 ENCOUNTER — Inpatient Hospital Stay: Payer: PPO | Attending: Oncology | Admitting: Oncology

## 2017-04-09 ENCOUNTER — Other Ambulatory Visit: Payer: Self-pay | Admitting: Pulmonary Disease

## 2017-04-09 DIAGNOSIS — J439 Emphysema, unspecified: Secondary | ICD-10-CM | POA: Insufficient documentation

## 2017-04-09 DIAGNOSIS — Z122 Encounter for screening for malignant neoplasm of respiratory organs: Secondary | ICD-10-CM

## 2017-04-09 DIAGNOSIS — Z87891 Personal history of nicotine dependence: Secondary | ICD-10-CM

## 2017-04-09 DIAGNOSIS — I700 Atherosclerosis of aorta: Secondary | ICD-10-CM | POA: Diagnosis not present

## 2017-04-09 MED ORDER — TIOTROPIUM BROMIDE-OLODATEROL 2.5-2.5 MCG/ACT IN AERS: 2.0000 | INHALATION_SPRAY | Freq: Every day | RESPIRATORY_TRACT | 5 refills | Status: DC

## 2017-04-11 ENCOUNTER — Encounter: Payer: Self-pay | Admitting: *Deleted

## 2017-04-11 DIAGNOSIS — Z87891 Personal history of nicotine dependence: Secondary | ICD-10-CM | POA: Insufficient documentation

## 2017-04-11 NOTE — Progress Notes (Signed)
In accordance with CMS guidelines, patient has met eligibility criteria including age, absence of signs or symptoms of lung cancer.  Social History  Substance Use Topics  . Smoking status: Former Smoker    Packs/day: 1.50    Years: 20.00    Types: Cigarettes    Quit date: 01/04/2009  . Smokeless tobacco: Never Used  . Alcohol use No     Comment: heavy in the past     A shared decision-making session was conducted prior to the performance of CT scan. This includes one or more decision aids, includes benefits and harms of screening, follow-up diagnostic testing, over-diagnosis, false positive rate, and total radiation exposure.  Counseling on the importance of adherence to annual lung cancer LDCT screening, impact of co-morbidities, and ability or willingness to undergo diagnosis and treatment is imperative for compliance of the program.  Counseling on the importance of continued smoking cessation for former smokers; the importance of smoking cessation for current smokers, and information about tobacco cessation interventions have been given to patient including [place] and [facility] quit [place] programs.  Written order for lung cancer screening with LDCT has been given to the patient and any and all questions have been answered to the best of my abilities.   Yearly follow up will be coordinated by Burgess Estelle, Thoracic Navigator.

## 2017-04-21 ENCOUNTER — Ambulatory Visit (INDEPENDENT_AMBULATORY_CARE_PROVIDER_SITE_OTHER): Payer: PPO | Admitting: Internal Medicine

## 2017-04-21 ENCOUNTER — Encounter: Payer: Self-pay | Admitting: Internal Medicine

## 2017-04-21 VITALS — BP 106/60 | HR 85 | Temp 98.1°F | Ht 60.0 in | Wt 163.0 lb

## 2017-04-21 DIAGNOSIS — M5440 Lumbago with sciatica, unspecified side: Secondary | ICD-10-CM

## 2017-04-21 DIAGNOSIS — I25119 Atherosclerotic heart disease of native coronary artery with unspecified angina pectoris: Secondary | ICD-10-CM

## 2017-04-21 DIAGNOSIS — G8929 Other chronic pain: Secondary | ICD-10-CM | POA: Diagnosis not present

## 2017-04-21 DIAGNOSIS — E1149 Type 2 diabetes mellitus with other diabetic neurological complication: Secondary | ICD-10-CM

## 2017-04-21 DIAGNOSIS — I5032 Chronic diastolic (congestive) heart failure: Secondary | ICD-10-CM

## 2017-04-21 DIAGNOSIS — F39 Unspecified mood [affective] disorder: Secondary | ICD-10-CM | POA: Diagnosis not present

## 2017-04-21 DIAGNOSIS — J449 Chronic obstructive pulmonary disease, unspecified: Secondary | ICD-10-CM

## 2017-04-21 DIAGNOSIS — N184 Chronic kidney disease, stage 4 (severe): Secondary | ICD-10-CM | POA: Diagnosis not present

## 2017-04-21 DIAGNOSIS — Z0000 Encounter for general adult medical examination without abnormal findings: Secondary | ICD-10-CM | POA: Diagnosis not present

## 2017-04-21 DIAGNOSIS — I739 Peripheral vascular disease, unspecified: Secondary | ICD-10-CM | POA: Diagnosis not present

## 2017-04-21 DIAGNOSIS — J9611 Chronic respiratory failure with hypoxia: Secondary | ICD-10-CM

## 2017-04-21 LAB — HM DIABETES FOOT EXAM

## 2017-04-21 MED ORDER — METHADONE HCL 10 MG PO TABS: 20.0000 mg | ORAL_TABLET | Freq: Four times a day (QID) | ORAL | 0 refills | Status: DC

## 2017-04-21 MED ORDER — TETANUS-DIPHTHERIA TOXOIDS TD 5-2 LFU IM INJ: 0.5000 mL | INJECTION | Freq: Once | INTRAMUSCULAR | 0 refills | Status: AC

## 2017-04-21 NOTE — Assessment & Plan Note (Signed)
Uses oxygen 24/7 Has portable machine

## 2017-04-21 NOTE — Assessment & Plan Note (Signed)
Sugars better with increased lantus Will recheck A1c at next visit No sig pain in feet--just numbness

## 2017-04-21 NOTE — Assessment & Plan Note (Signed)
Stable lately Follows with Dr Lateef---chronic hyponatremia as well

## 2017-04-21 NOTE — Assessment & Plan Note (Signed)
I have personally reviewed the Medicare Annual Wellness questionnaire and have noted 1. The patient's medical and social history 2. Their use of alcohol, tobacco or illicit drugs 3. Their current medications and supplements 4. The patient's functional ability including ADL's, fall risks, home safety risks and hearing or visual             impairment. 5. Diet and physical activities 6. Evidence for depression or mood disorders  The patients weight, height, BMI and visual acuity have been recorded in the chart I have made referrals, counseling and provided education to the patient based review of the above and I have provided the pt with a written personalized care plan for preventive services.  I have provided you with a copy of your personalized plan for preventive services. Please take the time to review along with your updated medication list.  Will give Rx for tetanus Yearly flu vaccine Mammogram due 4/19 Colon due 2019 No pap due to hyster Not able to exercise

## 2017-04-21 NOTE — Assessment & Plan Note (Signed)
Faint distal pulses No claudication (with limited activity)

## 2017-04-21 NOTE — Assessment & Plan Note (Signed)
Not common Recent nitro use effective No unstable features

## 2017-04-21 NOTE — Assessment & Plan Note (Signed)
Satisfied with methadone dosing--no change in years 70 confirms no Rx other than from this office

## 2017-04-21 NOTE — Assessment & Plan Note (Signed)
Compensated Weighs daily, etc

## 2017-04-21 NOTE — Assessment & Plan Note (Signed)
Stable status Sees pulmonary No med changes

## 2017-04-21 NOTE — Progress Notes (Signed)
Subjective:    Patient ID: Lindsay Harmon, female    DOB: 11/14/1955, 61 y.o.   MRN: [id number]  HPI Here for Medicare wellness and follow up of chronic health conditions Reviewed form and advanced directives Reviewed other doctors-- Dr Holley Raring (nephrology), Dr Sung Amabile (cardiology), [name] (pulmonary), Dr Duffy Bruce (dentist), [facility]  No alcohol or tobacco No exercise Chronic mood issues--doing well with this now Vision okay--keeps up with eye doctor Hearing is okay No falls Independent with instrumental ADLs Mild forgetfulness--but no worrisome cognitive problems  Having hand cramping Past CTS repairs--but having hand problems again Gets contractures of fingers while using them--actually has to extend  Chronic hyponatremia Renal failure is also chronic--stable as of a month ago  Has tickle in right ear chronically Seems better after trying to flush it out in shower Got a thin layer of skin out (or wax)--seems better  Still awaiting word about cholecystectomy Had ERCP--did get one stone out Was told she may need surgery still Appetite is stable No N/V Weight stable  Checks sugars regularly (daily) Has increased the lantus by 10 units over the past few weeks Now having sugars 140's-160's No hypoglycemic reactions Numbness in feet--no sig pain  Heart okay Did have one recent angina spell--responded to nitro Checks weight daily---holding steady No regular edema--very rare Sleeps in recliner-- at angle. No PND  Some SOB with activity--fairly stable No regular cough No wheezing recently Still uses oxygen 24/7 Recent chest CT for cancer screening was okay  Not depressed lately No anhedonia  Chronic pain is about the same Methadone 4 times daily Feels this is controlling things  Current Outpatient Prescriptions on File Prior to Visit  Medication Sig Dispense Refill  . albuterol (PROVENTIL HFA;VENTOLIN HFA) 108 (90 Base) MCG/ACT inhaler Inhale 2 puffs into  the lungs every 6 (six) hours as needed for wheezing or shortness of breath. 1 Inhaler 2  . aspirin 325 MG tablet Take 325 mg by mouth at bedtime.     . calcitRIOL (ROCALTROL) 0.5 MCG capsule     . cholecalciferol (VITAMIN D) 1000 units tablet Take 1,000 Units by mouth daily.    . enalapril (VASOTEC) 5 MG tablet Take 1 tablet (5 mg total) by mouth daily. 30 tablet 0  . gabapentin (NEURONTIN) 300 MG capsule Pt takes one capsule in the morning, one at noon, and two at bedtime. 360 capsule 3  . glipiZIDE (GLUCOTROL) 5 MG tablet Take 5 mg by mouth 2 (two) times daily before a meal.     . Insulin Glargine (LANTUS) 100 UNIT/ML Solostar Pen INJECT 10 TO 30 UNITS INTO SKIN DAILY (Patient taking differently: 40 Units. INJECT 10 TO 30 UNITS INTO SKIN DAILY ) 15 mL 3  . loratadine (CLARITIN) 10 MG tablet Take 10 mg by mouth 2 (two) times daily.     Marland Kitchen lovastatin (MEVACOR) 40 MG tablet Take 80 mg by mouth at bedtime.    . methadone (DOLOPHINE) 10 MG tablet Take 2 tablets (20 mg total) by mouth 4 (four) times daily. 240 tablet 0  . metoprolol tartrate (LOPRESSOR) 25 MG tablet Take 0.5 tablets (12.5 mg total) by mouth 2 (two) times daily. 90 tablet 3  . mirtazapine (REMERON) 30 MG tablet Take 15 mg by mouth at bedtime.    . nitroGLYCERIN (NITROSTAT) 0.4 MG SL tablet Place 1 tablet (0.4 mg total) under the tongue every 5 (five) minutes as needed for chest pain. 100 tablet 1  . ONE TOUCH ULTRA TEST test  strip USE 1 STRIP TO TEST BLOOD SUGAR ONCE DAILY 100 each 4  . pantoprazole (PROTONIX) 40 MG tablet Take 1 tablet (40 mg total) by mouth 2 (two) times daily. 60 tablet 3  . potassium chloride SA (K-DUR,KLOR-CON) 20 MEQ tablet Take 1 tablet by mouth 2 (two) times daily.    . SURE COMFORT PEN NEEDLES 31G X 5 MM MISC USE 1 PEN NEEDLE TO INJECT INSULIN 90 each 1  . Tiotropium Bromide-Olodaterol (STIOLTO RESPIMAT) 2.5-2.5 MCG/ACT AERS Inhale 2 puffs into the lungs daily. 4 g 5  . torsemide (DEMADEX) 20 MG tablet Take 1  tablet (20 mg total) by mouth daily. (Patient taking differently: Take 20 mg by mouth 2 (two) times daily. ) 1 tablet 0   No current facility-administered medications on file prior to visit.     Allergies  Allergen Reactions  . Sertraline Hcl Other (See Comments)    Malaise    Past Medical History:  Diagnosis Date  . Allergy   . Arthritis   . CAD (coronary artery disease)   . COPD (chronic obstructive pulmonary disease) ([place])   . Depression   . Esophageal stricture   . Familial hematuria   . GERD (gastroesophageal reflux disease)   . Hyperlipidemia   . Hypertension   . IBS (irritable bowel syndrome)   . Nephrolithiasis   . NIDDM (non-insulin dependent diabetes mellitus)    with neuropathy  . Obesity, unspecified   . Pulmonary hypertension ([place])   . PVD (peripheral vascular disease) ([place])     Past Surgical History:  Procedure Laterality Date  . ABDOMINAL HYSTERECTOMY    . CARPAL TUNNEL RELEASE    . CESAREAN SECTION    . ERCP N/A 03/17/2017   Procedure: ENDOSCOPIC RETROGRADE CHOLANGIOPANCREATOGRAPHY (ERCP);  Surgeon: [name], MD;  Location: [facility] ENDOSCOPY;  Service: Endoscopy;  Laterality: N/A;  . EUS N/A 03/13/2017   Procedure: FULL UPPER ENDOSCOPIC ULTRASOUND (EUS) RADIAL;  Surgeon: Burbridge, Murray Hodgkins, MD;  Location: [facility] ENDOSCOPY;  Service: Endoscopy;  Laterality: N/A;  . RIGHT HEART CATHETERIZATION N/A 12/05/2014   Procedure: RIGHT HEART CATH;  Surgeon: Sinclair Grooms, MD;  Location: [facility] CATH LAB;  Service: Cardiovascular;  Laterality: N/A;    Family History  Problem Relation Age of Onset  . Diabetes Mother   . Cancer Mother        ovarian,melanoma  . Allergies Father   . Cancer Father        lung  . Cancer Maternal Grandmother        uterine  . Emphysema Paternal Grandfather   . Allergies Sister   . Breast cancer Neg Hx     Social History   Social History  . Marital status: Legally Separated    Spouse name: N/A  . Number of children: 1  .  Years of education: N/A   Occupational History  . 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 Smoking status: Former Smoker    Packs/day: 1.50    Years: 20.00    Types: Cigarettes    Quit date: 01/04/2009  . Smokeless tobacco: Never Used  . Alcohol use No     Comment: heavy in the past  . Drug use: No  . Sexual activity: No   Other Topics Concern  . Not on file   Social History Narrative   No living will   Son [name] (then mom) should make decisions for her if she is unable.  Would accept resuscitation attempts but no prolonged ventilation   Not sure about tube feeds but probably wouldn't want them if cognitively unaware   Review of Systems Always wears seat belt No skin rash or suspicious lesions Bowels are okay Self breast exam is negative. No joint pain--just the back No urinary problems---recent UTI resolved    Objective:   Physical Exam  Constitutional: She is oriented to person, place, and time. She appears well-developed and well-nourished. No distress.  HENT:  Mouth/Throat: Oropharynx is clear and moist. No oropharyngeal exudate.  Upper plate  Neck: No thyromegaly present.  Cardiovascular: Normal rate and regular rhythm.  Exam reveals no gallop.   Faint pedal pulses Soft systolic murmur along left sternal border  Pulmonary/Chest: Effort normal. No respiratory distress. She has no wheezes. She has no rales.  Decreased breath sounds but clear  Abdominal: Soft. There is no tenderness.  Musculoskeletal: She exhibits no edema or tenderness.  Lymphadenopathy:    She has no cervical adenopathy.  Neurological: She is alert and oriented to person, place, and time.  President-- "Trump, Obama, ?" [id number] Recall 3/3  Decreased sensation in feet  Skin: No rash noted. No erythema.  Mild plantar callous No foot ulcers  Psychiatric: She has a normal mood and affect. Her behavior is normal.            Assessment & Plan:  Here

## 2017-04-21 NOTE — Assessment & Plan Note (Signed)
Has been better lately Will continue the mirtazapine

## 2017-04-23 ENCOUNTER — Ambulatory Visit: Payer: PPO | Admitting: Internal Medicine

## 2017-05-06 DIAGNOSIS — J449 Chronic obstructive pulmonary disease, unspecified: Secondary | ICD-10-CM | POA: Diagnosis not present

## 2017-05-06 DIAGNOSIS — I509 Heart failure, unspecified: Secondary | ICD-10-CM | POA: Diagnosis not present

## 2017-05-19 ENCOUNTER — Other Ambulatory Visit: Payer: Self-pay | Admitting: *Deleted

## 2017-05-19 MED ORDER — METHADONE HCL 10 MG PO TABS: 20.0000 mg | ORAL_TABLET | Freq: Four times a day (QID) | ORAL | 0 refills | Status: DC

## 2017-05-19 NOTE — Telephone Encounter (Signed)
Px printed for pick up in IN box Refill times one in pcp absence

## 2017-05-19 NOTE — Telephone Encounter (Signed)
Left voicemail letting pt know Rx ready for pick up

## 2017-05-19 NOTE — Telephone Encounter (Signed)
Last Rx 6/26. Last OV 03/2017

## 2017-05-27 ENCOUNTER — Telehealth: Payer: Self-pay

## 2017-05-27 NOTE — Telephone Encounter (Signed)
We received her forms from [place] from her sister yesterday. We have them ready. Left message for pt to call back. Does she want me to fax them to [facility] or does she want to mail them or hand deliver them? If I fax it, does she want the originals?

## 2017-06-06 DIAGNOSIS — J449 Chronic obstructive pulmonary disease, unspecified: Secondary | ICD-10-CM | POA: Diagnosis not present

## 2017-06-06 DIAGNOSIS — I509 Heart failure, unspecified: Secondary | ICD-10-CM | POA: Diagnosis not present

## 2017-06-16 ENCOUNTER — Other Ambulatory Visit: Payer: Self-pay

## 2017-06-16 DIAGNOSIS — N183 Chronic kidney disease, stage 3 (moderate): Secondary | ICD-10-CM | POA: Diagnosis not present

## 2017-06-16 DIAGNOSIS — R600 Localized edema: Secondary | ICD-10-CM | POA: Diagnosis not present

## 2017-06-16 DIAGNOSIS — E871 Hypo-osmolality and hyponatremia: Secondary | ICD-10-CM | POA: Diagnosis not present

## 2017-06-16 DIAGNOSIS — R809 Proteinuria, unspecified: Secondary | ICD-10-CM | POA: Diagnosis not present

## 2017-06-16 DIAGNOSIS — N2581 Secondary hyperparathyroidism of renal origin: Secondary | ICD-10-CM | POA: Diagnosis not present

## 2017-06-16 DIAGNOSIS — N184 Chronic kidney disease, stage 4 (severe): Secondary | ICD-10-CM | POA: Diagnosis not present

## 2017-06-16 DIAGNOSIS — R601 Generalized edema: Secondary | ICD-10-CM | POA: Diagnosis not present

## 2017-06-16 NOTE — Telephone Encounter (Signed)
Pt left v/m requesting rx for methadone for back pain. Call when ready for pick up. Last seen 04/21/17; rx last printed # 240 on 05/19/17. Dr Silvio Pate out of office.

## 2017-06-16 NOTE — Telephone Encounter (Signed)
To PCP who arrives tomorrow

## 2017-06-17 MED ORDER — METHADONE HCL 10 MG PO TABS: 20.0000 mg | ORAL_TABLET | Freq: Four times a day (QID) | ORAL | 0 refills | Status: DC

## 2017-06-17 NOTE — Telephone Encounter (Signed)
Dr. Silvio Pate signed pt's rx and I called informed pt that it is ready for pick up. She had no additional questions at this time. Nothing further is needed

## 2017-07-01 ENCOUNTER — Other Ambulatory Visit: Payer: Self-pay

## 2017-07-01 DIAGNOSIS — I10 Essential (primary) hypertension: Secondary | ICD-10-CM | POA: Diagnosis not present

## 2017-07-01 DIAGNOSIS — E871 Hypo-osmolality and hyponatremia: Secondary | ICD-10-CM | POA: Diagnosis not present

## 2017-07-01 DIAGNOSIS — N183 Chronic kidney disease, stage 3 (moderate): Secondary | ICD-10-CM | POA: Diagnosis not present

## 2017-07-01 DIAGNOSIS — E1122 Type 2 diabetes mellitus with diabetic chronic kidney disease: Secondary | ICD-10-CM | POA: Diagnosis not present

## 2017-07-01 MED ORDER — METOPROLOL TARTRATE 25 MG PO TABS
12.5000 mg | ORAL_TABLET | Freq: Two times a day (BID) | ORAL | 2 refills | Status: DC
Start: 2017-07-01 — End: 2018-04-21

## 2017-07-01 MED ORDER — PANTOPRAZOLE SODIUM 40 MG PO TBEC: 40.0000 mg | DELAYED_RELEASE_TABLET | Freq: Two times a day (BID) | ORAL | 0 refills | Status: DC

## 2017-07-01 MED ORDER — GLIPIZIDE 5 MG PO TABS: 5.0000 mg | ORAL_TABLET | Freq: Two times a day (BID) | ORAL | 1 refills | Status: DC

## 2017-07-01 NOTE — Telephone Encounter (Signed)
Pt request refills glipizide,metoprolol and pantoprazole to [facility]. Per dpr v/m left refills done as requested. Last annual 04/21/17.

## 2017-07-02 ENCOUNTER — Other Ambulatory Visit: Payer: PPO

## 2017-07-07 DIAGNOSIS — J449 Chronic obstructive pulmonary disease, unspecified: Secondary | ICD-10-CM | POA: Diagnosis not present

## 2017-07-07 DIAGNOSIS — I509 Heart failure, unspecified: Secondary | ICD-10-CM | POA: Diagnosis not present

## 2017-07-18 ENCOUNTER — Other Ambulatory Visit: Payer: Self-pay

## 2017-07-18 MED ORDER — METHADONE HCL 10 MG PO TABS: 20.0000 mg | ORAL_TABLET | Freq: Four times a day (QID) | ORAL | 0 refills | Status: DC

## 2017-07-18 NOTE — Telephone Encounter (Signed)
Pt left v/m requesting rx for methadone for back pain. Call when ready for pick up. Last printed # 240 on 06/17/17. Last annual 04/21/17. Dr Silvio Pate out of office until 07/22/17.Please advise.

## 2017-07-18 NOTE — Telephone Encounter (Signed)
Rx left in front office for pick up and pt is aware

## 2017-07-18 NOTE — Telephone Encounter (Signed)
RX printed and signed and placed in MYD box

## 2017-07-21 ENCOUNTER — Other Ambulatory Visit: Payer: Self-pay | Admitting: Internal Medicine

## 2017-07-23 ENCOUNTER — Encounter: Payer: Self-pay | Admitting: Internal Medicine

## 2017-07-23 ENCOUNTER — Ambulatory Visit (INDEPENDENT_AMBULATORY_CARE_PROVIDER_SITE_OTHER): Payer: PPO | Admitting: Internal Medicine

## 2017-07-23 VITALS — BP 122/64 | HR 88 | Temp 98.2°F | Wt 165.0 lb

## 2017-07-23 DIAGNOSIS — F1120 Opioid dependence, uncomplicated: Secondary | ICD-10-CM | POA: Diagnosis not present

## 2017-07-23 DIAGNOSIS — G8929 Other chronic pain: Secondary | ICD-10-CM | POA: Diagnosis not present

## 2017-07-23 DIAGNOSIS — M5440 Lumbago with sciatica, unspecified side: Secondary | ICD-10-CM | POA: Diagnosis not present

## 2017-07-23 DIAGNOSIS — E1149 Type 2 diabetes mellitus with other diabetic neurological complication: Secondary | ICD-10-CM | POA: Diagnosis not present

## 2017-07-23 DIAGNOSIS — N184 Chronic kidney disease, stage 4 (severe): Secondary | ICD-10-CM | POA: Diagnosis not present

## 2017-07-23 DIAGNOSIS — Z23 Encounter for immunization: Secondary | ICD-10-CM

## 2017-07-23 LAB — RENAL FUNCTION PANEL
Albumin: 3.9 g/dL (ref 3.5–5.2)
BUN: 25 mg/dL — ABNORMAL HIGH (ref 6–23)
CHLORIDE: 92 meq/L — AB (ref 96–112)
CO2: 36 mEq/L — ABNORMAL HIGH (ref 19–32)
Calcium: 9.7 mg/dL (ref 8.4–10.5)
Creatinine, Ser: 2 mg/dL — ABNORMAL HIGH (ref 0.40–1.20)
GFR: 26.69 mL/min — ABNORMAL LOW (ref >60.00)
Glucose, Bld: 229 mg/dL — ABNORMAL HIGH (ref 70–99)
PHOSPHORUS: 3.2 mg/dL (ref 2.3–4.6)
POTASSIUM: 4.7 meq/L (ref 3.5–5.1)
SODIUM: 134 meq/L — AB (ref 135–145)

## 2017-07-23 LAB — HEMOGLOBIN A1C: Hgb A1c MFr Bld: 9.3 % — ABNORMAL HIGH (ref 4.6–6.5)

## 2017-07-23 MED ORDER — INSULIN GLARGINE 100 UNIT/ML SOLOSTAR PEN: 32.0000 [IU] | PEN_INJECTOR | Freq: Every day | SUBCUTANEOUS | 11 refills | Status: DC

## 2017-07-23 MED ORDER — INSULIN PEN NEEDLE 31G X 5 MM MISC: 3 refills | Status: DC

## 2017-07-23 NOTE — Assessment & Plan Note (Signed)
Still seems to have good control Will check A1c

## 2017-07-23 NOTE — Addendum Note (Signed)
Addended by: Pilar Grammes on: 07/23/2017 02:36 PM   Modules accepted: Orders

## 2017-07-23 NOTE — Assessment & Plan Note (Signed)
Stable Reviewed CSRS--- risk score only 280. Stable methadone dose for many years Will continue

## 2017-07-23 NOTE — Assessment & Plan Note (Signed)
Gets reasonable relief from the methadone Able to function living alone

## 2017-07-23 NOTE — Progress Notes (Signed)
Subjective:    Patient ID: Lindsay Harmon, female    DOB: 1956-04-13, 61 y.o.   MRN: [id number]  HPI Here for follow up of chronic back pain and narcotic dependence--as well as diabetes  Back down to 32 units of lantus-due to hypoglycemia Sugars checked daily--- 124-198 Average 140-150  Chronic back pain persists Methadone still 4 times daily and doesn't miss doses Still some pain--depending on what Lindsay Harmon is doing Has some bad days---will just "ride it out" Lindsay Harmon still does all her instrumental ADLs  Current Outpatient Prescriptions on File Prior to Visit  Medication Sig Dispense Refill  . albuterol (PROVENTIL HFA;VENTOLIN HFA) 108 (90 Base) MCG/ACT inhaler Inhale 2 puffs into the lungs every 6 (six) hours as needed for wheezing or shortness of breath. 1 Inhaler 2  . aspirin 325 MG tablet Take 325 mg by mouth at bedtime.     . calcitRIOL (ROCALTROL) 0.5 MCG capsule     . cholecalciferol (VITAMIN D) 1000 units tablet Take 1,000 Units by mouth daily.    . enalapril (VASOTEC) 5 MG tablet Take 1 tablet (5 mg total) by mouth daily. 30 tablet 0  . gabapentin (NEURONTIN) 300 MG capsule Pt takes one capsule in the morning, one at noon, and two at bedtime. 360 capsule 3  . glipiZIDE (GLUCOTROL) 5 MG tablet Take 1 tablet (5 mg total) by mouth 2 (two) times daily before a meal. 180 tablet 1  . loratadine (CLARITIN) 10 MG tablet Take 10 mg by mouth 2 (two) times daily.     Marland Kitchen lovastatin (MEVACOR) 40 MG tablet Take 80 mg by mouth at bedtime.    . methadone (DOLOPHINE) 10 MG tablet Take 2 tablets (20 mg total) by mouth 4 (four) times daily. 240 tablet 0  . metoprolol tartrate (LOPRESSOR) 25 MG tablet Take 0.5 tablets (12.5 mg total) by mouth 2 (two) times daily. 90 tablet 2  . mirtazapine (REMERON) 30 MG tablet Take 15 mg by mouth at bedtime.    . nitroGLYCERIN (NITROSTAT) 0.4 MG SL tablet Place 1 tablet (0.4 mg total) under the tongue every 5 (five) minutes as needed for chest pain. 100 tablet 1  .  ONE TOUCH ULTRA TEST test strip USE 1 STRIP TO TEST BLOOD SUGAR ONCE DAILY 100 each 2  . pantoprazole (PROTONIX) 40 MG tablet Take 1 tablet (40 mg total) by mouth 2 (two) times daily. 180 tablet 0  . potassium chloride SA (K-DUR,KLOR-CON) 20 MEQ tablet Take 1 tablet by mouth 2 (two) times daily.    . Tiotropium Bromide-Olodaterol (STIOLTO RESPIMAT) 2.5-2.5 MCG/ACT AERS Inhale 2 puffs into the lungs daily. 4 g 5  . torsemide (DEMADEX) 20 MG tablet Take 1 tablet (20 mg total) by mouth daily. (Patient taking differently: Take 20 mg by mouth 2 (two) times daily. ) 1 tablet 0   No current facility-administered medications on file prior to visit.     Allergies  Allergen Reactions  . Sertraline Hcl Other (See Comments)    Malaise    Past Medical History:  Diagnosis Date  . Allergy   . Arthritis   . CAD (coronary artery disease)   . COPD (chronic obstructive pulmonary disease) ([place])   . Depression   . Esophageal stricture   . Familial hematuria   . GERD (gastroesophageal reflux disease)   . Hyperlipidemia   . Hypertension   . IBS (irritable bowel syndrome)   . Nephrolithiasis   . NIDDM (non-insulin dependent diabetes mellitus)    with  neuropathy  . Obesity, unspecified   . Pulmonary hypertension ([place])   . PVD (peripheral vascular disease) ([place])     Past Surgical History:  Procedure Laterality Date  . ABDOMINAL HYSTERECTOMY    . CARPAL TUNNEL RELEASE    . CESAREAN SECTION    . ERCP N/A 03/17/2017   Procedure: ENDOSCOPIC RETROGRADE CHOLANGIOPANCREATOGRAPHY (ERCP);  Surgeon: [name], MD;  Location: [facility] ENDOSCOPY;  Service: Endoscopy;  Laterality: N/A;  . EUS N/A 03/13/2017   Procedure: FULL UPPER ENDOSCOPIC ULTRASOUND (EUS) RADIAL;  Surgeon: Burbridge, Murray Hodgkins, MD;  Location: [facility] ENDOSCOPY;  Service: Endoscopy;  Laterality: N/A;  . RIGHT HEART CATHETERIZATION N/A 12/05/2014   Procedure: RIGHT HEART CATH;  Surgeon: Sinclair Grooms, MD;  Location: [facility] CATH LAB;  Service:  Cardiovascular;  Laterality: N/A;    Family History  Problem Relation Age of Onset  . Diabetes Mother   . Cancer Mother        ovarian,melanoma  . Allergies Father   . Cancer Father        lung  . Cancer Maternal Grandmother        uterine  . Emphysema Paternal Grandfather   . Allergies Sister   . Breast cancer Neg Hx     Social History   Social History  . Marital status: Legally Separated    Spouse name: N/A  . Number of children: 1  . Years of education: N/A   Occupational History  . 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 Smoking status: Former Smoker    Packs/day: 1.50    Years: 20.00    Types: Cigarettes    Quit date: 01/04/2009  . Smokeless tobacco: Never Used  . Alcohol use No     Comment: heavy in the past  . Drug use: No  . Sexual activity: No   Other Topics Concern  . Not on file   Social History Narrative   No living will   Son [name] (then mom) should make decisions for her if Lindsay Harmon is unable.    Would accept resuscitation attempts but no prolonged ventilation   Not sure about tube feeds but probably wouldn't want them if cognitively unaware   Review of Systems Never heard back about the gallbladder No abdominal pain Appetite is good Weight is stable--monitors daily    Objective:   Physical Exam  Constitutional: No distress.  Psychiatric: Lindsay Harmon has a normal mood and affect. Her behavior is normal.          Assessment & Plan:

## 2017-07-23 NOTE — Assessment & Plan Note (Signed)
Will recheck and send to Dr Holley Raring

## 2017-08-06 DIAGNOSIS — I509 Heart failure, unspecified: Secondary | ICD-10-CM | POA: Diagnosis not present

## 2017-08-06 DIAGNOSIS — J449 Chronic obstructive pulmonary disease, unspecified: Secondary | ICD-10-CM | POA: Diagnosis not present

## 2017-08-18 ENCOUNTER — Encounter: Payer: Self-pay | Admitting: *Deleted

## 2017-08-18 ENCOUNTER — Other Ambulatory Visit: Payer: Self-pay | Admitting: *Deleted

## 2017-08-18 MED ORDER — LOVASTATIN 40 MG PO TABS: 80.0000 mg | ORAL_TABLET | Freq: Every day | ORAL | 0 refills | Status: DC

## 2017-08-18 NOTE — Telephone Encounter (Signed)
  This encounter was created in error - please disregard.

## 2017-08-18 NOTE — Telephone Encounter (Signed)
Last 07/18/17 #240 Last office visit 07/23/17

## 2017-08-18 NOTE — Telephone Encounter (Signed)
Approved: okay to print #[id number] x 0

## 2017-08-18 NOTE — Telephone Encounter (Signed)
Pt requesting Methadone and Lovastatin refills.  She normally comes by and picks up the monthly Methadone Rx.

## 2017-08-19 MED ORDER — METHADONE HCL 10 MG PO TABS: 20.0000 mg | ORAL_TABLET | Freq: Four times a day (QID) | ORAL | 0 refills | Status: DC

## 2017-08-19 NOTE — Telephone Encounter (Signed)
Lm on pts vm and informed her Rx is available for pickup from the front desk

## 2017-08-19 NOTE — Addendum Note (Signed)
Addended by: Modena Nunnery on: 08/19/2017 08:45 AM   Modules accepted: Orders

## 2017-08-20 DIAGNOSIS — N184 Chronic kidney disease, stage 4 (severe): Secondary | ICD-10-CM | POA: Diagnosis not present

## 2017-08-20 DIAGNOSIS — E871 Hypo-osmolality and hyponatremia: Secondary | ICD-10-CM | POA: Diagnosis not present

## 2017-08-20 DIAGNOSIS — R600 Localized edema: Secondary | ICD-10-CM | POA: Diagnosis not present

## 2017-08-20 DIAGNOSIS — E876 Hypokalemia: Secondary | ICD-10-CM | POA: Diagnosis not present

## 2017-08-20 DIAGNOSIS — N2581 Secondary hyperparathyroidism of renal origin: Secondary | ICD-10-CM | POA: Diagnosis not present

## 2017-08-28 ENCOUNTER — Other Ambulatory Visit: Payer: Self-pay | Admitting: Pulmonary Disease

## 2017-09-06 DIAGNOSIS — J449 Chronic obstructive pulmonary disease, unspecified: Secondary | ICD-10-CM | POA: Diagnosis not present

## 2017-09-06 DIAGNOSIS — I509 Heart failure, unspecified: Secondary | ICD-10-CM | POA: Diagnosis not present

## 2017-09-14 ENCOUNTER — Other Ambulatory Visit: Payer: Self-pay | Admitting: Internal Medicine

## 2017-09-22 ENCOUNTER — Encounter: Payer: Self-pay | Admitting: Pulmonary Disease

## 2017-09-22 ENCOUNTER — Ambulatory Visit: Payer: PPO | Admitting: Pulmonary Disease

## 2017-09-22 VITALS — BP 114/66 | HR 88 | Ht 62.0 in | Wt 165.6 lb

## 2017-09-22 DIAGNOSIS — J439 Emphysema, unspecified: Secondary | ICD-10-CM | POA: Diagnosis not present

## 2017-09-22 DIAGNOSIS — J9611 Chronic respiratory failure with hypoxia: Secondary | ICD-10-CM

## 2017-09-22 DIAGNOSIS — R011 Cardiac murmur, unspecified: Secondary | ICD-10-CM | POA: Diagnosis not present

## 2017-09-22 NOTE — Progress Notes (Signed)
Subjective:    Patient ID: Lindsay Harmon, female    DOB: 1956/05/07, 61 y.o.   MRN: [id number]   Synopsis: Referred by cardiology in 2016 for evaluation of severe COPD and possible obstructive sleep apnea in the setting of pulmonary hypertension. She also has congestive heart failure. A sleep study in 2016 showed no evidence of obstructive sleep apnea but she does have significant nocturnal hypoxemia. Spirometry testing in 2016 confirmed a diagnosis of COPD with severe airflow obstruction FEV1 41% pred.  She smoked cigarettes for many years and she quit smoking in 2013 after smoking 2 ppd for 40 years.   HPI Chief Complaint  Patient presents with  . Follow-up    follow up appt, no complaints, feels everything is better   COPD: > stable interval > no recent problems > no bronchitis > no pneumonia since the last visit > she had a flu shot after the last  > she is trying to stay active, she has been watching her mother more closely  Chronic respiratory failure with hypoxemia: > still using and benefitting from her oxygen  She notes some ankle swelling, is taking her torsemide regularly.   Past Medical History:  Diagnosis Date  . Allergy   . Arthritis   . CAD (coronary artery disease)   . COPD (chronic obstructive pulmonary disease) ([place])   . Depression   . Esophageal stricture   . Familial hematuria   . GERD (gastroesophageal reflux disease)   . Hyperlipidemia   . Hypertension   . IBS (irritable bowel syndrome)   . Nephrolithiasis   . NIDDM (non-insulin dependent diabetes mellitus)    with neuropathy  . Obesity, unspecified   . Pulmonary hypertension ([place])   . PVD (peripheral vascular disease) ([place])       Review of Systems  Constitutional: Positive for fatigue. Negative for diaphoresis and fever.  HENT: Negative for postnasal drip, rhinorrhea and sinus pressure.   Respiratory: Negative for cough, shortness of breath and wheezing.   Cardiovascular: Negative for  chest pain, palpitations and leg swelling.       Objective:   Physical Exam  Vitals:   09/22/17 1021  BP: 114/66  Pulse: 88  SpO2: 93%  Weight: 165 lb 9.6 oz (75.1 kg)  Height: _0  (1.575 m)  3L [place] pulse  Gen: chronically ill appearing HENT: OP clear, TM's clear, neck supple PULM: CTA B, normal percussion CV: RRR, systolic heart murmur RUSB, trace edema GI: BS+, soft, nontender Derm: no cyanosis or rash Psyche: normal mood and affect    CBC    Component Value Date/Time   WBC 9.3 02/26/2017 1452   RBC 4.94 02/26/2017 1452   HGB 11.6 (L) 02/26/2017 1452   HCT 36.4 02/26/2017 1452   PLT 412 02/26/2017 1452   MCV 73.7 (L) 02/26/2017 1452   MCH 23.4 (L) 02/26/2017 1452   MCHC 31.8 (L) 02/26/2017 1452   RDW 20.0 (H) 02/26/2017 1452   LYMPHSABS 2.3 02/26/2017 1452   MONOABS 1.0 (H) 02/26/2017 1452   EOSABS 0.3 02/26/2017 1452   BASOSABS 0.1 02/26/2017 1452   Records from her February 2016 echocardiogram reviewed showing no evidence of a valvular problem.     Assessment & Plan:   Murmur, cardiac - Plan: ECHOCARDIOGRAM COMPLETE  Chronic respiratory failure with hypoxia (HCC)  Pulmonary emphysema, unspecified emphysema type (HCC)  Discussion: From a COPD standpoint this is been a stable interval as she has not had an exacerbation since the last visit.  Continues to use and benefit from her oxygen but she has not had a qualifying walk this year so we need to take care of that today.  That all being said, she has a new heart murmur on exam and reports some ankle swelling recently.  We need to get an echocardiogram to evaluate this further.  Heart murmur: We will arrange for an echocardiogram to assess because of this problem, we'll call you with the results  Chronic respiratory failure with hypoxemia: Keep using oxygen as you are doing  COPD: Keep using Stiolto 2 puffs daily  Follow up in 6 months or sooner if needed   Current Outpatient Medications:  .   aspirin 325 MG tablet, Take 325 mg by mouth at bedtime. , Disp: , Rfl:  .  calcitRIOL (ROCALTROL) 0.5 MCG capsule, , Disp: , Rfl:  .  cholecalciferol (VITAMIN D) 1000 units tablet, Take 1,000 Units by mouth daily., Disp: , Rfl:  .  enalapril (VASOTEC) 5 MG tablet, Take 1 tablet (5 mg total) by mouth daily., Disp: 30 tablet, Rfl: 0 .  gabapentin (NEURONTIN) 300 MG capsule, Take 1 capsule by mouth in the morning, 1 capsule at noon, and 2 capsules at bedtime, Disp: 360 capsule, Rfl: 2 .  glipiZIDE (GLUCOTROL) 5 MG tablet, Take 1 tablet (5 mg total) by mouth 2 (two) times daily before a meal., Disp: 180 tablet, Rfl: 1 .  Insulin Glargine (LANTUS) 100 UNIT/ML Solostar Pen, Inject 32 Units into the skin daily., Disp: 15 mL, Rfl: 11 .  Insulin Pen Needle (SURE COMFORT PEN NEEDLES) 31G X 5 MM MISC, USE 1 PEN NEEDLE TO INJECT INSULIN, Disp: 90 each, Rfl: 3 .  loratadine (CLARITIN) 10 MG tablet, Take 10 mg by mouth 2 (two) times daily. , Disp: , Rfl:  .  lovastatin (MEVACOR) 40 MG tablet, Take 2 tablets (80 mg total) by mouth at bedtime., Disp: 180 tablet, Rfl: 0 .  methadone (DOLOPHINE) 10 MG tablet, Take 2 tablets (20 mg total) by mouth 4 (four) times daily., Disp: 240 tablet, Rfl: 0 .  metoprolol tartrate (LOPRESSOR) 25 MG tablet, Take 0.5 tablets (12.5 mg total) by mouth 2 (two) times daily., Disp: 90 tablet, Rfl: 2 .  mirtazapine (REMERON) 30 MG tablet, Take 15 mg by mouth at bedtime., Disp: , Rfl:  .  nitroGLYCERIN (NITROSTAT) 0.4 MG SL tablet, Place 1 tablet (0.4 mg total) under the tongue every 5 (five) minutes as needed for chest pain., Disp: 100 tablet, Rfl: 1 .  ONE TOUCH ULTRA TEST test strip, USE 1 STRIP TO TEST BLOOD SUGAR ONCE DAILY, Disp: 100 each, Rfl: 2 .  pantoprazole (PROTONIX) 40 MG tablet, Take 1 tablet (40 mg total) by mouth 2 (two) times daily., Disp: 180 tablet, Rfl: 0 .  potassium chloride SA (K-DUR,KLOR-CON) 20 MEQ tablet, Take 1 tablet by mouth 2 (two) times daily., Disp: , Rfl:  .   Tiotropium Bromide-Olodaterol (STIOLTO RESPIMAT) 2.5-2.5 MCG/ACT AERS, Inhale 2 puffs into the lungs daily., Disp: 4 g, Rfl: 5 .  torsemide (DEMADEX) 20 MG tablet, Take 1 tablet (20 mg total) by mouth daily. (Patient taking differently: Take 20 mg by mouth 2 (two) times daily. Is taking 1 tab daily, then 2 tabs daily- alternate), Disp: 1 tablet, Rfl: 0 .  VENTOLIN HFA 108 (90 Base) MCG/ACT inhaler, USE 2 PUFFS EVERY SIX HOURS AS NEEDED FOR WHEEZING OR SHORTNESS OF BREATH, Disp: 18 g, Rfl: 3

## 2017-09-22 NOTE — Patient Instructions (Signed)
Heart murmur: We will arrange for an echocardiogram to assess because of this problem, we'll call you with the results  Chronic respiratory failure with hypoxemia: Keep using oxygen as you are doing  COPD: Keep using Stiolto 2 puffs daily  Follow up in 6 months or sooner if needed

## 2017-09-30 ENCOUNTER — Other Ambulatory Visit: Payer: Self-pay

## 2017-09-30 ENCOUNTER — Ambulatory Visit (INDEPENDENT_AMBULATORY_CARE_PROVIDER_SITE_OTHER): Payer: PPO

## 2017-09-30 DIAGNOSIS — R011 Cardiac murmur, unspecified: Secondary | ICD-10-CM

## 2017-10-01 ENCOUNTER — Telehealth: Payer: Self-pay | Admitting: *Deleted

## 2017-10-01 ENCOUNTER — Other Ambulatory Visit: Payer: Self-pay | Admitting: Internal Medicine

## 2017-10-01 NOTE — Telephone Encounter (Signed)
-----  Message from Juanito Doom, MD sent at 10/01/2017 12:56 PM EST ----- Triage,  I'd like to see her in clinic first avaialble (or within a week with NP if I don't have anything) to go over her echo results.  Thanks, B

## 2017-10-01 NOTE — Telephone Encounter (Signed)
Pt request rx methadone. Call when ready for pick up. Last printed # 240 on 08/19/17. Last seen 07/23/17 and UDS 03/21/17.

## 2017-10-01 NOTE — Telephone Encounter (Signed)
Copied from [place] [id number]. Topic: Quick Communication - See Telephone Encounter >> Oct 01, 2017  3:52 PM Bea Graff, NT wrote: CRM for notification. See Telephone encounter for: Pt needing a refill of methadone. Please call when ready for pick up  10/01/17.

## 2017-10-01 NOTE — Telephone Encounter (Signed)
lmtcb x1 for pt. BQ has several openings on 10/03/2017.

## 2017-10-02 MED ORDER — METHADONE HCL 10 MG PO TABS: 20.0000 mg | ORAL_TABLET | Freq: Four times a day (QID) | ORAL | 0 refills | Status: DC

## 2017-10-02 NOTE — Telephone Encounter (Signed)
Spoke with pt. She has been scheduled to see BQ on 10/03/17 at 2:15pm. Nothing further was needed.

## 2017-10-02 NOTE — Telephone Encounter (Signed)
Spoke to pt. Rx up front ready for pickup 

## 2017-10-03 ENCOUNTER — Ambulatory Visit: Payer: PPO | Admitting: Pulmonary Disease

## 2017-10-03 ENCOUNTER — Other Ambulatory Visit: Payer: Self-pay | Admitting: Internal Medicine

## 2017-10-03 ENCOUNTER — Encounter: Payer: Self-pay | Admitting: Pulmonary Disease

## 2017-10-03 VITALS — BP 124/70 | HR 77 | Ht 62.0 in | Wt 167.0 lb

## 2017-10-03 DIAGNOSIS — I350 Nonrheumatic aortic (valve) stenosis: Secondary | ICD-10-CM

## 2017-10-03 DIAGNOSIS — I2781 Cor pulmonale (chronic): Secondary | ICD-10-CM | POA: Diagnosis not present

## 2017-10-03 DIAGNOSIS — J439 Emphysema, unspecified: Secondary | ICD-10-CM | POA: Diagnosis not present

## 2017-10-03 DIAGNOSIS — J449 Chronic obstructive pulmonary disease, unspecified: Secondary | ICD-10-CM | POA: Diagnosis not present

## 2017-10-03 NOTE — Progress Notes (Signed)
Subjective:    Patient ID: Lindsay Harmon, female    DOB: 04-14-1956, 61 y.o.   MRN: [id number]   Synopsis: Referred by cardiology in 2016 for evaluation of severe COPD and possible obstructive sleep apnea in the setting of pulmonary hypertension. She also has congestive heart failure. A sleep study in 2016 showed no evidence of obstructive sleep apnea but she does have significant nocturnal hypoxemia. Spirometry testing in 2016 confirmed a diagnosis of COPD with severe airflow obstruction FEV1 41% pred.  She smoked cigarettes for many years and she quit smoking in 2013 after smoking 2 ppd for 40 years.   HPI Chief Complaint  Patient presents with  . Follow-up   Ankle swelling: > she has been taking torsemide on alternating basis, it has been stable since the last visit.  She has been using 2 L of oxygen at rest and 3 L with exertion.  No recent cough or bronchitis.  No changes in torsemide dosing since the last visit  No changes in Stiolto.  Past Medical History:  Diagnosis Date  . Allergy   . Arthritis   . CAD (coronary artery disease)   . COPD (chronic obstructive pulmonary disease) ([place])   . Depression   . Esophageal stricture   . Familial hematuria   . GERD (gastroesophageal reflux disease)   . Hyperlipidemia   . Hypertension   . IBS (irritable bowel syndrome)   . Nephrolithiasis   . NIDDM (non-insulin dependent diabetes mellitus)    with neuropathy  . Obesity, unspecified   . Pulmonary hypertension ([place])   . PVD (peripheral vascular disease) ([place])       Review of Systems  Constitutional: Positive for fatigue. Negative for diaphoresis and fever.  HENT: Negative for postnasal drip, rhinorrhea and sinus pressure.   Respiratory: Negative for cough, shortness of breath and wheezing.   Cardiovascular: Negative for chest pain, palpitations and leg swelling.       Objective:   Physical Exam  Vitals:   10/03/17 1359 10/03/17 1400  BP: 124/70 124/70  Pulse: 77 77   SpO2: 95% 95%  Weight: 167 lb (75.8 kg)   Height: 5' 2" (1.575 m)   3L [place] pulse  Walked 150 feet on 3 L and O2 saturation dropped to 86%, required 5 L to return over 90%  Gen: chronically ill appearing HENT: OP clear, TM's clear, neck supple PULM: Crackles bases B, normal percussion CV: RRR, no mgr, trace ankle edema GI: BS+, soft, nontender Derm: no cyanosis or rash Psyche: normal mood and affect   CBC    Component Value Date/Time   WBC 9.3 02/26/2017 1452   RBC 4.94 02/26/2017 1452   HGB 11.6 (L) 02/26/2017 1452   HCT 36.4 02/26/2017 1452   PLT 412 02/26/2017 1452   MCV 73.7 (L) 02/26/2017 1452   MCH 23.4 (L) 02/26/2017 1452   MCHC 31.8 (L) 02/26/2017 1452   RDW 20.0 (H) 02/26/2017 1452   LYMPHSABS 2.3 02/26/2017 1452   MONOABS 1.0 (H) 02/26/2017 1452   EOSABS 0.3 02/26/2017 1452   BASOSABS 0.1 02/26/2017 1452   Echo: December 2018 echocardiogram showed a normal LVEF RV was mildly dilated with an RVSP greater than 100  RHC 12/05/2014 RA 12 mmHg (mean) with O2 sat 72%; RV 97/16 mmHg with O2 sat 73%; PA 97/30 mmHg with O2 sat 67%; PCWP(mean) 14 mmHg (mean); Cardiac Output 5.78 L/min  Chest imaging: CT scan chest June 2018 images independently reviewed showing mild to moderate centrilobular  emphysema in the upper lobes, some mosaicism in the bases. Feb 2016 V/Q IMPRESSION: No evidence of pulmonary embolism.     Assessment & Plan:   Chronic obstructive pulmonary disease, unspecified COPD type ([place]) - Plan: Pulse oximetry, overnight  Pulmonary emphysema, unspecified emphysema type (HCC)  Cor pulmonale, chronic (HCC)  Nonrheumatic aortic valve stenosis  Discussion: Walta's echocardiogram showed evidence of worsening pulmonary hypertension.  Today we checked her O2 saturation while walking and we needed to adjust her O2 prescription to 3 L at rest, 5 L with exertion.  She does have severe airflow obstruction so this may be the cause, but given the new aortic  stenosis and heart murmur like for her to go back to cardiology for a second look.  They may want to consider repeat heart catheterization because of the severity of her pressure.  We may need to consider pulmonary vasodilator even in the face of her severe airflow obstruction.    Plan: Pulmonary hypertension: As we discussed today, this term means that you have high blood pressure in your lungs.  We have followed you for this for some time but it seems that this has worsened recently.  I am going to have you go back to see Dr. Haroldine Laws to discuss this and the new finding of the aortic valve narrowing.  In the meantime continue taking torsemide as you are doing.  Severe COPD: Continue Stiolto daily  Chronic respiratory failure with hypoxemia: Continue 3 L of oxygen at rest and 5 L when walking  We will see you back in 2-3 months  > 50% of this 30 minute visit spent face to face    Current Outpatient Medications:  .  aspirin 325 MG tablet, Take 325 mg by mouth at bedtime. , Disp: , Rfl:  .  calcitRIOL (ROCALTROL) 0.5 MCG capsule, , Disp: , Rfl:  .  cholecalciferol (VITAMIN D) 1000 units tablet, Take 1,000 Units by mouth daily., Disp: , Rfl:  .  enalapril (VASOTEC) 5 MG tablet, Take 1 tablet (5 mg total) by mouth daily., Disp: 30 tablet, Rfl: 0 .  gabapentin (NEURONTIN) 300 MG capsule, Take 1 capsule by mouth in the morning, 1 capsule at noon, and 2 capsules at bedtime, Disp: 360 capsule, Rfl: 2 .  glipiZIDE (GLUCOTROL) 5 MG tablet, Take 1 tablet (5 mg total) by mouth 2 (two) times daily before a meal., Disp: 180 tablet, Rfl: 1 .  Insulin Glargine (LANTUS) 100 UNIT/ML Solostar Pen, Inject 32 Units into the skin daily., Disp: 15 mL, Rfl: 11 .  Insulin Pen Needle (SURE COMFORT PEN NEEDLES) 31G X 5 MM MISC, USE 1 PEN NEEDLE TO INJECT INSULIN, Disp: 90 each, Rfl: 3 .  loratadine (CLARITIN) 10 MG tablet, Take 10 mg by mouth 2 (two) times daily. , Disp: , Rfl:  .  lovastatin (MEVACOR) 40 MG  tablet, Take 2 tablets (80 mg total) by mouth at bedtime., Disp: 180 tablet, Rfl: 0 .  methadone (DOLOPHINE) 10 MG tablet, Take 2 tablets (20 mg total) by mouth 4 (four) times daily., Disp: 240 tablet, Rfl: 0 .  metoprolol tartrate (LOPRESSOR) 25 MG tablet, Take 0.5 tablets (12.5 mg total) by mouth 2 (two) times daily., Disp: 90 tablet, Rfl: 2 .  mirtazapine (REMERON) 30 MG tablet, Take 15 mg by mouth at bedtime., Disp: , Rfl:  .  nitroGLYCERIN (NITROSTAT) 0.4 MG SL tablet, Place 1 tablet (0.4 mg total) under the tongue every 5 (five) minutes as needed for chest pain., Disp:  100 tablet, Rfl: 1 .  ONE TOUCH ULTRA TEST test strip, USE 1 STRIP TO TEST BLOOD SUGAR ONCE DAILY, Disp: 100 each, Rfl: 2 .  pantoprazole (PROTONIX) 40 MG tablet, Take 1 tablet by mouth twice a day, Disp: 180 tablet, Rfl: 3 .  potassium chloride SA (K-DUR,KLOR-CON) 20 MEQ tablet, Take 1 tablet by mouth 2 (two) times daily., Disp: , Rfl:  .  Tiotropium Bromide-Olodaterol (STIOLTO RESPIMAT) 2.5-2.5 MCG/ACT AERS, Inhale 2 puffs into the lungs daily., Disp: 4 g, Rfl: 5 .  torsemide (DEMADEX) 20 MG tablet, Take 1 tablet (20 mg total) by mouth daily. (Patient taking differently: Take 20 mg by mouth 2 (two) times daily. Is taking 1 tab daily, then 2 tabs daily- alternate), Disp: 1 tablet, Rfl: 0 .  VENTOLIN HFA 108 (90 Base) MCG/ACT inhaler, USE 2 PUFFS EVERY SIX HOURS AS NEEDED FOR WHEEZING OR SHORTNESS OF BREATH, Disp: 18 g, Rfl: 3

## 2017-10-03 NOTE — Addendum Note (Signed)
Addended by: Della Goo C on: 10/03/2017 03:54 PM   Modules accepted: Orders

## 2017-10-03 NOTE — Patient Instructions (Signed)
Pulmonary hypertension: As we discussed today, this term means that you have high blood pressure in your lungs.  We have followed you for this for some time but it seems that this has worsened recently.  I am going to have you go back to see Dr. Haroldine Laws to discuss this and the new finding of the aortic valve narrowing.  In the meantime continue taking torsemide as you are doing.  Severe COPD: Continue Stiolto daily  Chronic respiratory failure with hypoxemia: Continue 3 L of oxygen at rest and 5 L when walking  We will see you back in 2-3 months

## 2017-10-06 DIAGNOSIS — I509 Heart failure, unspecified: Secondary | ICD-10-CM | POA: Diagnosis not present

## 2017-10-06 DIAGNOSIS — J449 Chronic obstructive pulmonary disease, unspecified: Secondary | ICD-10-CM | POA: Diagnosis not present

## 2017-10-17 DIAGNOSIS — E871 Hypo-osmolality and hyponatremia: Secondary | ICD-10-CM | POA: Diagnosis not present

## 2017-10-17 DIAGNOSIS — R809 Proteinuria, unspecified: Secondary | ICD-10-CM | POA: Diagnosis not present

## 2017-10-17 DIAGNOSIS — R600 Localized edema: Secondary | ICD-10-CM | POA: Diagnosis not present

## 2017-10-17 DIAGNOSIS — N184 Chronic kidney disease, stage 4 (severe): Secondary | ICD-10-CM | POA: Diagnosis not present

## 2017-10-22 ENCOUNTER — Encounter: Payer: Self-pay | Admitting: Internal Medicine

## 2017-10-22 ENCOUNTER — Ambulatory Visit: Payer: PPO | Admitting: Internal Medicine

## 2017-10-22 VITALS — BP 132/84 | HR 95 | Ht 62.0 in | Wt 166.8 lb

## 2017-10-22 DIAGNOSIS — M5440 Lumbago with sciatica, unspecified side: Secondary | ICD-10-CM | POA: Diagnosis not present

## 2017-10-22 DIAGNOSIS — G8929 Other chronic pain: Secondary | ICD-10-CM

## 2017-10-22 DIAGNOSIS — E1151 Type 2 diabetes mellitus with diabetic peripheral angiopathy without gangrene: Secondary | ICD-10-CM | POA: Diagnosis not present

## 2017-10-22 DIAGNOSIS — I25119 Atherosclerotic heart disease of native coronary artery with unspecified angina pectoris: Secondary | ICD-10-CM | POA: Diagnosis not present

## 2017-10-22 DIAGNOSIS — I270 Primary pulmonary hypertension: Secondary | ICD-10-CM | POA: Diagnosis not present

## 2017-10-22 DIAGNOSIS — E1165 Type 2 diabetes mellitus with hyperglycemia: Secondary | ICD-10-CM | POA: Diagnosis not present

## 2017-10-22 DIAGNOSIS — IMO0002 Reserved for concepts with insufficient information to code with codable children: Secondary | ICD-10-CM

## 2017-10-22 LAB — LIPID PANEL
CHOL/HDL RATIO: 3
Cholesterol: 120 mg/dL (ref 0–200)
HDL: 34.9 mg/dL — ABNORMAL LOW (ref >39.00)
LDL CALC: 54 mg/dL (ref 0–99)
NONHDL: 84.65
TRIGLYCERIDES: 151 mg/dL — AB (ref 0.0–149.0)
VLDL: 30.2 mg/dL (ref 0.0–40.0)

## 2017-10-22 LAB — HEMOGLOBIN A1C: HEMOGLOBIN A1C: 9 % — AB (ref 4.6–6.5)

## 2017-10-22 NOTE — Progress Notes (Signed)
Subjective:    Patient ID: Lindsay Harmon, female    DOB: 10/05/1956, 61 y.o.   MRN: [id number]  HPI Here for follow up of diabetes, CHF, narcotic needs, etc  Went to [name] recently He found heart murmur and she got echo Extremely high pulmonary pressures--will be going back to CHF clinic Has noticed more DOE He found increased hypoxia with activity (and asked her to increase to 3l/min all the time and 5l/min for activity)  Some chest pain Did use nitro once--it worked fairly quickly  Checks sugars daily 130-194---average 150's One hypoglycemic spell only Mild foot numbness is stable  Back pain is stable Continues on the same methodone dose  Continues with Dr Holley Raring for the CKD  Current Outpatient Medications on File Prior to Visit  Medication Sig Dispense Refill  . aspirin 325 MG tablet Take 325 mg by mouth at bedtime.     . calcitRIOL (ROCALTROL) 0.5 MCG capsule     . cholecalciferol (VITAMIN D) 1000 units tablet Take 1,000 Units by mouth daily.    . enalapril (VASOTEC) 5 MG tablet Take 1 tablet (5 mg total) by mouth daily. 30 tablet 0  . gabapentin (NEURONTIN) 300 MG capsule Take 1 capsule by mouth in the morning, 1 capsule at noon, and 2 capsules at bedtime 360 capsule 2  . glipiZIDE (GLUCOTROL) 5 MG tablet Take 1 tablet (5 mg total) by mouth 2 (two) times daily before a meal. 180 tablet 1  . Insulin Glargine (LANTUS) 100 UNIT/ML Solostar Pen Inject 32 Units into the skin daily. 15 mL 11  . Insulin Pen Needle (SURE COMFORT PEN NEEDLES) 31G X 5 MM MISC USE 1 PEN NEEDLE TO INJECT INSULIN 90 each 3  . loratadine (CLARITIN) 10 MG tablet Take 10 mg by mouth 2 (two) times daily.     Marland Kitchen lovastatin (MEVACOR) 40 MG tablet Take 2 tablets (80 mg total) by mouth at bedtime. 180 tablet 0  . methadone (DOLOPHINE) 10 MG tablet Take 2 tablets (20 mg total) by mouth 4 (four) times daily. 240 tablet 0  . metoprolol tartrate (LOPRESSOR) 25 MG tablet Take 0.5 tablets (12.5 mg total) by  mouth 2 (two) times daily. 90 tablet 2  . mirtazapine (REMERON) 30 MG tablet Take 15 mg by mouth at bedtime.    . nitroGLYCERIN (NITROSTAT) 0.4 MG SL tablet Place 1 tablet (0.4 mg total) under the tongue every 5 (five) minutes as needed for chest pain. 100 tablet 1  . ONE TOUCH ULTRA TEST test strip USE 1 STRIP TO TEST BLOOD SUGAR ONCE DAILY 100 each 2  . pantoprazole (PROTONIX) 40 MG tablet Take 1 tablet by mouth twice a day 180 tablet 3  . potassium chloride SA (K-DUR,KLOR-CON) 20 MEQ tablet Take 1 tablet by mouth 2 (two) times daily.    . Tiotropium Bromide-Olodaterol (STIOLTO RESPIMAT) 2.5-2.5 MCG/ACT AERS Inhale 2 puffs into the lungs daily. 4 g 5  . torsemide (DEMADEX) 20 MG tablet Take 1 tablet (20 mg total) by mouth daily. (Patient taking differently: Take 20 mg by mouth 2 (two) times daily. Is taking 1 tab daily, then 2 tabs daily- alternate) 1 tablet 0  . VENTOLIN HFA 108 (90 Base) MCG/ACT inhaler USE 2 PUFFS EVERY SIX HOURS AS NEEDED FOR WHEEZING OR SHORTNESS OF BREATH 18 g 3   No current facility-administered medications on file prior to visit.     Allergies  Allergen Reactions  . Sertraline Hcl Other (See Comments)  Malaise    Past Medical History:  Diagnosis Date  . Allergy   . Arthritis   . CAD (coronary artery disease)   . COPD (chronic obstructive pulmonary disease) ([place])   . Depression   . Esophageal stricture   . Familial hematuria   . GERD (gastroesophageal reflux disease)   . Hyperlipidemia   . Hypertension   . IBS (irritable bowel syndrome)   . Nephrolithiasis   . NIDDM (non-insulin dependent diabetes mellitus)    with neuropathy  . Obesity, unspecified   . Pulmonary hypertension ([place])   . PVD (peripheral vascular disease) ([place])     Past Surgical History:  Procedure Laterality Date  . ABDOMINAL HYSTERECTOMY    . CARPAL TUNNEL RELEASE    . CESAREAN SECTION    . ERCP N/A 03/17/2017   Procedure: ENDOSCOPIC RETROGRADE CHOLANGIOPANCREATOGRAPHY (ERCP);   Surgeon: [name], MD;  Location: [facility] ENDOSCOPY;  Service: Endoscopy;  Laterality: N/A;  . EUS N/A 03/13/2017   Procedure: FULL UPPER ENDOSCOPIC ULTRASOUND (EUS) RADIAL;  Surgeon: Burbridge, Murray Hodgkins, MD;  Location: [facility] ENDOSCOPY;  Service: Endoscopy;  Laterality: N/A;  . RIGHT HEART CATHETERIZATION N/A 12/05/2014   Procedure: RIGHT HEART CATH;  Surgeon: Sinclair Grooms, MD;  Location: [facility] C[facility] LAB;  Service: Cardiovascular;  Laterality: N/A;    Family History  Problem Relation Age of Onset  . Diabetes Mother   . Cancer Mother        ovarian,melanoma  . Allergies Father   . Cancer Father        lung  . Cancer Maternal Grandmother        uterine  . Emphysema Paternal Grandfather   . Allergies Sister   . Breast cancer Neg Hx     Social History   Socioeconomic History  . Marital status: Legally Separated    Spouse name: Not on file  . Number of children: 1  . Years of education: Not on file  . Highest education level: Not on file  Social Needs  . Financial resource strain: Not on file  . Food insecurity - worry: Not on file  . Food insecurity - inability: Not on file  . Transportation needs - medical: Not on file  . Transportation needs - non-medical: Not on file  Occupational History  . 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 Smoking status: Former Smoker    Packs/day: 1.50    Years: 20.00    Pack years: 30.00    Types: Cigarettes    Last attempt to quit: 01/04/2009    Years since quitting: 8.8  . Smokeless tobacco: Never Used  Substance and Sexual Activity  . Alcohol use: No    Alcohol/week: 0.0 oz    Comment: heavy in the past  . Drug use: No  . Sexual activity: No  Other Topics Concern  . Not on file  Social History Narrative   No living will   Son [name] (then mom) should make decisions for her if she is unable.    Would accept resuscitation attempts but no prolonged ventilation   Not sure about tube  feeds but probably wouldn't want them if cognitively unaware   Review of Systems Some minor right frontal headaches recently. Resolve on their own Staying with mom to help her--hasn't been checking weight--discussed restarting daily checks Appetite okay    Objective:   Physical Exam  Constitutional: No distress.  Neck: No thyromegaly present.  Cardiovascular: Normal rate and regular rhythm. Exam reveals no gallop.  Gr 2/6 systolic murmur at left sternal border No pulses in feet  Pulmonary/Chest: Effort normal. No respiratory distress. She has no wheezes. She has no rales.  Decreased breath sounds but clear  Musculoskeletal: She exhibits no edema or tenderness.  Lymphadenopathy:    She has no cervical adenopathy.  Skin:  No foot lesions  Psychiatric: She has a normal mood and affect. Her behavior is normal.          Assessment & Plan:

## 2017-10-22 NOTE — Assessment & Plan Note (Signed)
Rare chest pain with nitro helping On appropriate regimen

## 2017-10-22 NOTE — Assessment & Plan Note (Signed)
Doing okay Goal is to keep her under 9% without hypoglycemia No sig foot pain

## 2017-10-22 NOTE — Assessment & Plan Note (Signed)
With worsened DOE, etc Going back to CHF clinic ?consider sildenafil

## 2017-10-22 NOTE — Assessment & Plan Note (Signed)
Doing okay on chronic methadone--no change in years Will check CSRS

## 2017-11-03 ENCOUNTER — Other Ambulatory Visit: Payer: Self-pay | Admitting: Internal Medicine

## 2017-11-03 ENCOUNTER — Other Ambulatory Visit: Payer: Self-pay | Admitting: Pulmonary Disease

## 2017-11-03 MED ORDER — METHADONE HCL 10 MG PO TABS: 20.0000 mg | ORAL_TABLET | Freq: Four times a day (QID) | ORAL | 0 refills | Status: DC

## 2017-11-03 NOTE — Telephone Encounter (Signed)
Spoke to pt. Rx up front ready for pickup 

## 2017-11-03 NOTE — Telephone Encounter (Signed)
Requesting refill of Methadone  LOV 10/22/17 with Dr. Silvio Pate

## 2017-11-03 NOTE — Telephone Encounter (Signed)
Copied from [place]. Topic: Quick Communication - See Telephone Encounter >> Nov 03, 2017 10:22 AM Boyd Kerbs wrote: CRM for notification. See Telephone encounter for:  Pt needing a refill of methadone. Please call when ready for pick up  [place] [place] [address] Phone: [phone] Fax: [phone]   11/03/17.

## 2017-11-03 NOTE — Telephone Encounter (Signed)
Pt requesting rx methadone. Call when ready for pickup . Last printed #240 on 10/02/17; last seen 10/22/17. Last UDS 03/21/17.

## 2017-11-04 ENCOUNTER — Encounter (HOSPITAL_COMMUNITY): Payer: Self-pay

## 2017-11-04 ENCOUNTER — Ambulatory Visit (HOSPITAL_COMMUNITY)
Admission: RE | Admit: 2017-11-04 | Discharge: 2017-11-04 | Disposition: A | Discharge status: Home / Self Care | Payer: PPO | Source: Ambulatory Visit | Attending: Cardiology | Admitting: Cardiology

## 2017-11-04 VITALS — BP 136/78 | HR 90 | Wt 166.0 lb

## 2017-11-04 DIAGNOSIS — K219 Gastro-esophageal reflux disease without esophagitis: Secondary | ICD-10-CM | POA: Diagnosis not present

## 2017-11-04 DIAGNOSIS — E1165 Type 2 diabetes mellitus with hyperglycemia: Secondary | ICD-10-CM | POA: Diagnosis not present

## 2017-11-04 DIAGNOSIS — Z79899 Other long term (current) drug therapy: Secondary | ICD-10-CM | POA: Insufficient documentation

## 2017-11-04 DIAGNOSIS — E1151 Type 2 diabetes mellitus with diabetic peripheral angiopathy without gangrene: Secondary | ICD-10-CM

## 2017-11-04 DIAGNOSIS — Z801 Family history of malignant neoplasm of trachea, bronchus and lung: Secondary | ICD-10-CM | POA: Diagnosis not present

## 2017-11-04 DIAGNOSIS — Z794 Long term (current) use of insulin: Secondary | ICD-10-CM | POA: Diagnosis not present

## 2017-11-04 DIAGNOSIS — Z803 Family history of malignant neoplasm of breast: Secondary | ICD-10-CM | POA: Insufficient documentation

## 2017-11-04 DIAGNOSIS — I130 Hypertensive heart and chronic kidney disease with heart failure and stage 1 through stage 4 chronic kidney disease, or unspecified chronic kidney disease: Secondary | ICD-10-CM | POA: Insufficient documentation

## 2017-11-04 DIAGNOSIS — Z87891 Personal history of nicotine dependence: Secondary | ICD-10-CM | POA: Diagnosis not present

## 2017-11-04 DIAGNOSIS — E1149 Type 2 diabetes mellitus with other diabetic neurological complication: Secondary | ICD-10-CM | POA: Diagnosis not present

## 2017-11-04 DIAGNOSIS — J449 Chronic obstructive pulmonary disease, unspecified: Secondary | ICD-10-CM

## 2017-11-04 DIAGNOSIS — I272 Pulmonary hypertension, unspecified: Secondary | ICD-10-CM | POA: Diagnosis not present

## 2017-11-04 DIAGNOSIS — E871 Hypo-osmolality and hyponatremia: Secondary | ICD-10-CM | POA: Insufficient documentation

## 2017-11-04 DIAGNOSIS — E1122 Type 2 diabetes mellitus with diabetic chronic kidney disease: Secondary | ICD-10-CM | POA: Diagnosis not present

## 2017-11-04 DIAGNOSIS — I509 Heart failure, unspecified: Secondary | ICD-10-CM | POA: Diagnosis not present

## 2017-11-04 DIAGNOSIS — I5032 Chronic diastolic (congestive) heart failure: Secondary | ICD-10-CM | POA: Diagnosis not present

## 2017-11-04 DIAGNOSIS — J9610 Chronic respiratory failure, unspecified whether with hypoxia or hypercapnia: Secondary | ICD-10-CM | POA: Diagnosis not present

## 2017-11-04 DIAGNOSIS — Z7982 Long term (current) use of aspirin: Secondary | ICD-10-CM | POA: Insufficient documentation

## 2017-11-04 DIAGNOSIS — N184 Chronic kidney disease, stage 4 (severe): Secondary | ICD-10-CM | POA: Diagnosis not present

## 2017-11-04 DIAGNOSIS — IMO0002 Reserved for concepts with insufficient information to code with codable children: Secondary | ICD-10-CM

## 2017-11-04 DIAGNOSIS — I50810 Right heart failure, unspecified: Secondary | ICD-10-CM

## 2017-11-04 LAB — BASIC METABOLIC PANEL
Anion gap: 9 (ref 5–15)
BUN: 31 mg/dL — ABNORMAL HIGH (ref 6–20)
CALCIUM: 9.2 mg/dL (ref 8.9–10.3)
CO2: 32 mmol/L (ref 22–32)
CREATININE: 2.64 mg/dL — AB (ref 0.44–1.00)
Chloride: 97 mmol/L — ABNORMAL LOW (ref 101–111)
GFR calc non Af Amer: 18 mL/min — ABNORMAL LOW (ref >60)
GFR, EST AFRICAN AMERICAN: 21 mL/min — AB (ref >60)
GLUCOSE: 129 mg/dL — AB (ref 65–99)
Potassium: 5.1 mmol/L (ref 3.5–5.1)
Sodium: 138 mmol/L (ref 135–145)

## 2017-11-04 NOTE — Progress Notes (Addendum)
Patient ID: Lindsay Harmon, female   DOB: 04-10-1956, 62 y.o.   MRN: [id number]    Advanced Heart Failure Clinic Note   PCP: Dr Silvio Pate  Primary HF  Cardiologist: Dr Haroldine Laws Pulmonary: [name]   HPI: Lindsay Harmon is a 62 y.o. female with history of HTN, DM, IBS, depression, GERD, severe COPD (FEV1 0.87L)  and pulmonary hypertension with RV failure/cor pulmonale, and chronic diastolic heart failure. .  Admitted in 8/16 with volume overload and severe hyponatremia. Diuresed with IV lasix and transtioned back torsemide and metolazone as needed. Discharge weight 153 pounds.   Admitted to [facility] in 12/16 with volume depletion and hyponatremia with Na 113. Hydrated. Has since followed up with Dr. Anthonette Legato in Renal. Now on torsemide 20 bid and occasional metolazone.   She presents today for HF follow up. Last seen in clinic 12/2015. Recent work up via Dr. Lake Bells with Peak PA pressure 115 mm Hg on Echo and new mild/mod AS. This was done due to new murmur noticed on exam. Pt has been gradually more SOB over the past 2-3 months.  Dr. Lake Bells recently increased O2 to 5L, Pt realized today she had forgotten to do this on her portable O2 (Unclear chronicity). She is doing so at home with minimal improvement.  She denies DOE with ADLs, but is SOB with hills, or stairs. Sleeps in a recliner chronically due to back pain. Taking all medications as directed. Hasn't needed metolazone in some time, + this was stopped by Nephrology.   - Echo 09/30/2017 LVEF 55-60%, Grade 1 DD, Mild/Mod AS Mean gradient 16 mmHg, Peak PA pressure 115 Hg  RHC 12/05/2014 RA 12 mmHg (mean) with O2 sat 72%; RV 97/16 mmHg with O2 sat 73%; PA 97/30 mmHg with O2 sat 67%; PCWP(mean) 14 mmHg (mean); Cardiac Output 5.78 L/min  PFTs 2/92016 with severe COPD  FEV1 0.87 (38%) FVC 1.41 (48%) FEF 25-75 0.41 (19%) Unable to do DLCO  Echo (2/16) EF 60-65%, aortic sclerosis, RV moderately dilated/moderately decreased systolic  function, PASP 71 mmHg  06/14/15 K 4.6 Creatinine 2.00 12/11/2015: Na 128 K 3.8 Creatinine 2.01  Review of systems complete and found to be negative unless listed in HPI.    SH:  Social History   Socioeconomic History  . Marital status: Legally Separated    Spouse name: Not on file  . Number of children: 1  . Years of education: Not on file  . Highest education level: Not on file  Social Needs  . Financial resource strain: Not on file  . Food insecurity - worry: Not on file  . Food insecurity - inability: Not on file  . Transportation needs - medical: Not on file  . Transportation needs - non-medical: Not on file  Occupational History  . 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 Smoking status: Former Smoker    Packs/day: 1.50    Years: 20.00    Pack years: 30.00    Types: Cigarettes    Last attempt to quit: 01/04/2009    Years since quitting: 8.8  . Smokeless tobacco: Never Used  Substance and Sexual Activity  . Alcohol use: No    Alcohol/week: 0.0 oz    Comment: heavy in the past  . Drug use: No  . Sexual activity: No  Other Topics Concern  . Not on file  Social History Narrative   No living will   Son Lindsay Harmon (then mom)  should make decisions for her if she is unable.    Would accept resuscitation attempts but no prolonged ventilation   Not sure about tube feeds but probably wouldn't want them if cognitively unaware   FH:  Family History  Problem Relation Age of Onset  . Diabetes Mother   . Cancer Mother        ovarian,melanoma  . Allergies Father   . Cancer Father        lung  . Cancer Maternal Grandmother        uterine  . Emphysema Paternal Grandfather   . Allergies Sister   . Breast cancer Neg Hx     Past Medical History:  Diagnosis Date  . Allergy   . Arthritis   . CAD (coronary artery disease)   . COPD (chronic obstructive pulmonary disease) ([place])   . Depression   . Esophageal stricture   .  Familial hematuria   . GERD (gastroesophageal reflux disease)   . Hyperlipidemia   . Hypertension   . IBS (irritable bowel syndrome)   . Nephrolithiasis   . NIDDM (non-insulin dependent diabetes mellitus)    with neuropathy  . Obesity, unspecified   . Pulmonary hypertension ([place])   . PVD (peripheral vascular disease) ([place])    Current Outpatient Medications  Medication Sig Dispense Refill  . aspirin 325 MG tablet Take 325 mg by mouth at bedtime.     . calcitRIOL (ROCALTROL) 0.5 MCG capsule     . cholecalciferol (VITAMIN D) 1000 units tablet Take 1,000 Units by mouth daily.    . enalapril (VASOTEC) 5 MG tablet Take 1 tablet (5 mg total) by mouth daily. 30 tablet 0  . gabapentin (NEURONTIN) 300 MG capsule Take 1 capsule by mouth in the morning, 1 capsule at noon, and 2 capsules at bedtime 360 capsule 2  . glipiZIDE (GLUCOTROL) 5 MG tablet Take 1 tablet (5 mg total) by mouth 2 (two) times daily before a meal. 180 tablet 1  . Insulin Glargine (LANTUS) 100 UNIT/ML Solostar Pen Inject 32 Units into the skin daily. 15 mL 11  . Insulin Pen Needle (SURE COMFORT PEN NEEDLES) 31G X 5 MM MISC USE 1 PEN NEEDLE TO INJECT INSULIN 90 each 3  . loratadine (CLARITIN) 10 MG tablet Take 10 mg by mouth 2 (two) times daily.     Marland Kitchen lovastatin (MEVACOR) 40 MG tablet Take 2 tablets (80 mg total) by mouth at bedtime. 180 tablet 0  . methadone (DOLOPHINE) 10 MG tablet Take 2 tablets (20 mg total) by mouth 4 (four) times daily. 240 tablet 0  . metoprolol tartrate (LOPRESSOR) 25 MG tablet Take 0.5 tablets (12.5 mg total) by mouth 2 (two) times daily. 90 tablet 2  . mirtazapine (REMERON) 30 MG tablet Take 15 mg by mouth at bedtime.    . nitroGLYCERIN (NITROSTAT) 0.4 MG SL tablet Place 1 tablet (0.4 mg total) under the tongue every 5 (five) minutes as needed for chest pain. 100 tablet 1  . ONE TOUCH ULTRA TEST test strip USE 1 STRIP TO TEST BLOOD SUGAR ONCE DAILY 100 each 2  . pantoprazole (PROTONIX) 40 MG tablet Take 1  tablet by mouth twice a day 180 tablet 3  . potassium chloride SA (K-DUR,KLOR-CON) 20 MEQ tablet Take 1 tablet by mouth 2 (two) times daily.    Marland Kitchen STIOLTO RESPIMAT 2.5-2.5 MCG/ACT AERS Inhale 2 puffs into the lungs daily. 4 g 5  . STIOLTO RESPIMAT 2.5-2.5 MCG/ACT AERS Inhale 2 puffs into the  lungs daily. 4 g 5  . torsemide (DEMADEX) 20 MG tablet Take 1 tablet (20 mg total) by mouth daily. (Patient taking differently: Take 20 mg by mouth 2 (two) times daily. Is taking 1 tab daily, then 2 tabs daily- alternate) 1 tablet 0  . VENTOLIN HFA 108 (90 Base) MCG/ACT inhaler USE 2 PUFFS EVERY SIX HOURS AS NEEDED FOR WHEEZING OR SHORTNESS OF BREATH 18 g 3   No current facility-administered medications for this encounter.    Vitals:   11/04/17 1333  BP: 136/78  Pulse: 90  SpO2: (!) 89%  Weight: 166 lb (75.3 kg)   Wt Readings from Last 3 Encounters:  11/04/17 166 lb (75.3 kg)  10/22/17 166 lb 12 oz (75.6 kg)  10/03/17 167 lb (75.8 kg)    PHYSICAL EXAM: General:  Sitting one exam Wearing O2 No resp difficulty.  HEENT: normal Neck: supple. JVP 5-6. Carotids 2+ bilaterally; no bruits. No lymphadenopathy or thryomegaly appreciated. Cor: PMI normal. Regular rate & rhythm. No rubs, gallops or murmurs. Lungs: clear on 2 liters [place] with markedly reduced BS throughout Abdomen: obese, soft, nontender, nondistended. No hepatosplenomegaly. No bruits or masses. Good bowel sounds. Extremities: no cyanosis, clubbing, rash, trace edema.   Neuro: alert & orientedx3, cranial nerves grossly intact. Moves all 4 extremities w/o difficulty. Affect pleasant.  ASSESSMENT & PLAN: 1. Chronic Diastolic HF - Echo 59/01/7075 LVEF 55-60%, Grade 1 DD, Mild/Mod AS Mean gradient 16 mmHg, Peak PA pressure 115 Hg  - NYHA III chronically - Volume status looks OK on exam - Continue torsemide 40 mg alternating with 20 mg daily.  - Hasn't needed metolazone in some time.  -will follow sodium closely.  Can consider demeclocycline or  occasional tolvaptan  - Discussed with Dr. Holley Raring who will co-manage 2. PAH with cor pulmonale  - likely Who Group 3. Not candidate for selective pulmonary vasodilators - As above with elevated Peak PA pressure on Echo. Previously as high as 97 mmHg on RHC in 2016.  - With primarily WHO Group 3 disease will focus on COPD and diuresis. Will discuss further with Dr. Haroldine Laws. - Could consider repeat PFTs (last on file 2016)  - She has previously established disease with known cause from COPD, no indication for immediate RHC.  3. Severe COPD- Followed by [name] - O2 continues to drop on exertion to 79%. She had been continuing O2 at 3L instead of 5L as ordered. Now corrected.  4. Chronic respiratory failure - On 3 L at rest and 5 with exertion.  5. Hyponatremia - BMET today.  6. CKD III-IV - Has been seeing renal. Baseline seems to be slowly worsening and now over 2.0.  - BMET today.  7. Mild/Mod AS - Does not seem severe on echo.  Will discuss further with Dr. Haroldine Laws. - Could consider TEE to quantify further if needed, but would avoid procedures when possible with her lung disease.   Will leave medications the same for now with apparently stable volume status.  Will discuss case with Dr. Haroldine Laws. For now, plan RTC in 2-3 months with uptitration of O2 as directed by Dr. Lake Bells.   Satira Mccallum Tillery, PA-C  1:22 PM   Greater than 50% of the 30 minute visit was spent in counseling/coordination of care regarding disease state education, salt/fluid restriction, sliding scale diuretics, Pulmonary Hypertension discussion, and medication compliance.

## 2017-11-04 NOTE — Addendum Note (Signed)
Encounter addended by: Shirley Friar, PA-C on: 11/04/2017 4:12 PM  Actions taken: Sign clinical note

## 2017-11-04 NOTE — Patient Instructions (Signed)
Routine lab work today. Will notify you of abnormal results, otherwise no news is good news!  No changes to medication at this time.  Follow up 2-3 months with Dr. Haroldine Laws.  ________________________________________________________________ Lindsay Harmon Code: 9002  Take all medication as prescribed the day of your appointment. Bring all medications with you to your appointment.  Do the following things EVERYDAY: 1) Weigh yourself in the morning before breakfast. Write it down and keep it in a log. 2) Take your medicines as prescribed 3) Eat low salt foods-Limit salt (sodium) to 2000 mg per day.  4) Stay as active as you can everyday 5) Limit all fluids for the day to less than 2 liters

## 2017-11-06 ENCOUNTER — Telehealth (HOSPITAL_COMMUNITY): Payer: Self-pay

## 2017-11-06 DIAGNOSIS — I509 Heart failure, unspecified: Secondary | ICD-10-CM | POA: Diagnosis not present

## 2017-11-06 DIAGNOSIS — J449 Chronic obstructive pulmonary disease, unspecified: Secondary | ICD-10-CM | POA: Diagnosis not present

## 2017-11-06 NOTE — Telephone Encounter (Signed)
Result Notes for Basic metabolic panel   Notes recorded by Effie Berkshire, RN on 11/06/2017 at 4:10 PM EST LVMTCB. ------  Notes recorded by Shirley Friar, PA-C on 11/06/2017 at 9:19 AM EST Please call today.    [name] [address] [place], PA-C 11/06/2017 9:19 AM ------  Notes recorded by Shirley Friar, PA-C on 11/04/2017 at 3:39 PM EST  Would stop daily K and recheck in 1 week.     [name] [address] [place], [place] 11/04/2017 3:38 PM

## 2017-11-11 ENCOUNTER — Encounter (HOSPITAL_COMMUNITY): Payer: Self-pay

## 2017-11-13 ENCOUNTER — Telehealth (HOSPITAL_COMMUNITY): Payer: Self-pay

## 2017-11-14 NOTE — Telephone Encounter (Signed)
Pt aware of results and Rx faxed to Dr. Silvio Pate [phone]) office for lab work     Result Notes for Basic metabolic panel   Notes recorded by Effie Berkshire, RN on 11/06/2017 at 4:10 PM EST LVMTCB. ------  Notes recorded by Shirley Friar, PA-C on 11/06/2017 at 9:19 AM EST Please call today.    [name] [address] [place], PA-C 11/06/2017 9:19 AM ------  Notes recorded by Shirley Friar, PA-C on 11/04/2017 at 3:39 PM EST  Would stop daily K and recheck in 1 week.     [name] [address] [place], [place]

## 2017-11-17 ENCOUNTER — Other Ambulatory Visit: Payer: Self-pay | Admitting: Internal Medicine

## 2017-11-17 DIAGNOSIS — I5032 Chronic diastolic (congestive) heart failure: Secondary | ICD-10-CM

## 2017-11-20 ENCOUNTER — Other Ambulatory Visit (INDEPENDENT_AMBULATORY_CARE_PROVIDER_SITE_OTHER): Payer: PPO

## 2017-11-20 ENCOUNTER — Other Ambulatory Visit: Payer: PPO

## 2017-11-20 DIAGNOSIS — I5032 Chronic diastolic (congestive) heart failure: Secondary | ICD-10-CM | POA: Diagnosis not present

## 2017-11-20 LAB — RENAL FUNCTION PANEL
ALBUMIN: 3.7 g/dL (ref 3.5–5.2)
BUN: 33 mg/dL — AB (ref 6–23)
CALCIUM: 9.3 mg/dL (ref 8.4–10.5)
CO2: 32 mEq/L (ref 19–32)
CREATININE: 2.16 mg/dL — AB (ref 0.40–1.20)
Chloride: 97 mEq/L (ref 96–112)
GFR: 24.4 mL/min — ABNORMAL LOW (ref >60.00)
GLUCOSE: 137 mg/dL — AB (ref 70–99)
POTASSIUM: 4.2 meq/L (ref 3.5–5.1)
Phosphorus: 3 mg/dL (ref 2.3–4.6)
SODIUM: 139 meq/L (ref 135–145)

## 2017-12-05 ENCOUNTER — Other Ambulatory Visit: Payer: Self-pay

## 2017-12-05 MED ORDER — LOVASTATIN 40 MG PO TABS: 80.0000 mg | ORAL_TABLET | Freq: Every day | ORAL | 3 refills | Status: DC

## 2017-12-07 DIAGNOSIS — I509 Heart failure, unspecified: Secondary | ICD-10-CM | POA: Diagnosis not present

## 2017-12-07 DIAGNOSIS — J449 Chronic obstructive pulmonary disease, unspecified: Secondary | ICD-10-CM | POA: Diagnosis not present

## 2017-12-09 ENCOUNTER — Other Ambulatory Visit: Payer: Self-pay | Admitting: Internal Medicine

## 2017-12-09 NOTE — Telephone Encounter (Signed)
Copied from [place]. Topic: Quick Communication - Rx Refill/Question >> Dec 09, 2017  4:28 PM Bennye Alm wrote: Medication: methadone (DOLOPHINE) 10 MG tablet   Has the patient contacted their pharmacy? No controlled substance  (Agent: If no, request that the patient contact the pharmacy for the refill.)  Preferred Pharmacy (with phone number or street name): n/a  Agent: Please be advised that RX refills may take up to 3 business days. We ask that you follow-up with your pharmacy.

## 2017-12-10 MED ORDER — METHADONE HCL 10 MG PO TABS: 20.0000 mg | ORAL_TABLET | Freq: Four times a day (QID) | ORAL | 0 refills | Status: DC

## 2017-12-10 NOTE — Telephone Encounter (Signed)
Last OV 10-22-17 Next OV 01-21-18  Last UDS/CSA 03-21-17

## 2017-12-10 NOTE — Telephone Encounter (Signed)
Refill  Request    Dolophine      -  Control  Substance  LOV  10/22/2017  Last  Fill   11/03/2017

## 2017-12-12 ENCOUNTER — Telehealth: Payer: Self-pay | Admitting: Pulmonary Disease

## 2017-12-12 NOTE — Telephone Encounter (Signed)
Left voice mail on machine for patient to return phone call back regarding results. X1  BQ advised pt needs to have her O2 at night increased to 4L and repeat ONO. Placed ONO today Routing message to [facility] to f/u

## 2017-12-15 ENCOUNTER — Telehealth: Payer: Self-pay | Admitting: Pulmonary Disease

## 2017-12-15 NOTE — Telephone Encounter (Signed)
Called patient unable to reach, unable to leave voicemail due to it not being set up yet.

## 2017-12-15 NOTE — Telephone Encounter (Signed)
Patient returning call, CB is [phone].

## 2017-12-15 NOTE — Telephone Encounter (Signed)
Called and spoke with patient she is aware and nothing further is needed.

## 2017-12-19 DIAGNOSIS — N2581 Secondary hyperparathyroidism of renal origin: Secondary | ICD-10-CM | POA: Diagnosis not present

## 2017-12-19 DIAGNOSIS — E871 Hypo-osmolality and hyponatremia: Secondary | ICD-10-CM | POA: Diagnosis not present

## 2017-12-19 DIAGNOSIS — R809 Proteinuria, unspecified: Secondary | ICD-10-CM | POA: Diagnosis not present

## 2017-12-19 DIAGNOSIS — R600 Localized edema: Secondary | ICD-10-CM | POA: Diagnosis not present

## 2017-12-19 DIAGNOSIS — N184 Chronic kidney disease, stage 4 (severe): Secondary | ICD-10-CM | POA: Diagnosis not present

## 2017-12-31 ENCOUNTER — Ambulatory Visit (HOSPITAL_COMMUNITY)
Admission: RE | Admit: 2017-12-31 | Discharge: 2017-12-31 | Disposition: A | Discharge status: Home / Self Care | Payer: PPO | Source: Ambulatory Visit | Attending: Internal Medicine | Admitting: Internal Medicine

## 2017-12-31 VITALS — BP 112/68 | HR 87 | Wt 173.4 lb

## 2017-12-31 DIAGNOSIS — I130 Hypertensive heart and chronic kidney disease with heart failure and stage 1 through stage 4 chronic kidney disease, or unspecified chronic kidney disease: Secondary | ICD-10-CM | POA: Insufficient documentation

## 2017-12-31 DIAGNOSIS — E1151 Type 2 diabetes mellitus with diabetic peripheral angiopathy without gangrene: Secondary | ICD-10-CM | POA: Insufficient documentation

## 2017-12-31 DIAGNOSIS — F329 Major depressive disorder, single episode, unspecified: Secondary | ICD-10-CM | POA: Diagnosis not present

## 2017-12-31 DIAGNOSIS — Z825 Family history of asthma and other chronic lower respiratory diseases: Secondary | ICD-10-CM | POA: Diagnosis not present

## 2017-12-31 DIAGNOSIS — I272 Pulmonary hypertension, unspecified: Secondary | ICD-10-CM | POA: Diagnosis not present

## 2017-12-31 DIAGNOSIS — E785 Hyperlipidemia, unspecified: Secondary | ICD-10-CM | POA: Diagnosis not present

## 2017-12-31 DIAGNOSIS — N184 Chronic kidney disease, stage 4 (severe): Secondary | ICD-10-CM | POA: Insufficient documentation

## 2017-12-31 DIAGNOSIS — Z8041 Family history of malignant neoplasm of ovary: Secondary | ICD-10-CM | POA: Diagnosis not present

## 2017-12-31 DIAGNOSIS — I5032 Chronic diastolic (congestive) heart failure: Secondary | ICD-10-CM | POA: Insufficient documentation

## 2017-12-31 DIAGNOSIS — I2510 Atherosclerotic heart disease of native coronary artery without angina pectoris: Secondary | ICD-10-CM | POA: Insufficient documentation

## 2017-12-31 DIAGNOSIS — M549 Dorsalgia, unspecified: Secondary | ICD-10-CM | POA: Diagnosis not present

## 2017-12-31 DIAGNOSIS — Z794 Long term (current) use of insulin: Secondary | ICD-10-CM | POA: Insufficient documentation

## 2017-12-31 DIAGNOSIS — Z808 Family history of malignant neoplasm of other organs or systems: Secondary | ICD-10-CM | POA: Diagnosis not present

## 2017-12-31 DIAGNOSIS — K589 Irritable bowel syndrome without diarrhea: Secondary | ICD-10-CM | POA: Diagnosis not present

## 2017-12-31 DIAGNOSIS — E1122 Type 2 diabetes mellitus with diabetic chronic kidney disease: Secondary | ICD-10-CM | POA: Diagnosis not present

## 2017-12-31 DIAGNOSIS — Z801 Family history of malignant neoplasm of trachea, bronchus and lung: Secondary | ICD-10-CM | POA: Insufficient documentation

## 2017-12-31 DIAGNOSIS — Z87891 Personal history of nicotine dependence: Secondary | ICD-10-CM | POA: Insufficient documentation

## 2017-12-31 DIAGNOSIS — J449 Chronic obstructive pulmonary disease, unspecified: Secondary | ICD-10-CM | POA: Diagnosis not present

## 2017-12-31 DIAGNOSIS — Z7982 Long term (current) use of aspirin: Secondary | ICD-10-CM | POA: Insufficient documentation

## 2017-12-31 DIAGNOSIS — Z79899 Other long term (current) drug therapy: Secondary | ICD-10-CM | POA: Insufficient documentation

## 2017-12-31 DIAGNOSIS — Z833 Family history of diabetes mellitus: Secondary | ICD-10-CM | POA: Insufficient documentation

## 2017-12-31 DIAGNOSIS — K219 Gastro-esophageal reflux disease without esophagitis: Secondary | ICD-10-CM | POA: Insufficient documentation

## 2017-12-31 DIAGNOSIS — E871 Hypo-osmolality and hyponatremia: Secondary | ICD-10-CM | POA: Insufficient documentation

## 2017-12-31 DIAGNOSIS — J9610 Chronic respiratory failure, unspecified whether with hypoxia or hypercapnia: Secondary | ICD-10-CM | POA: Insufficient documentation

## 2017-12-31 DIAGNOSIS — E669 Obesity, unspecified: Secondary | ICD-10-CM | POA: Insufficient documentation

## 2017-12-31 MED ORDER — METOLAZONE 2.5 MG PO TABS: 2.5000 mg | ORAL_TABLET | ORAL | 0 refills | Status: DC

## 2017-12-31 MED ORDER — TORSEMIDE 20 MG PO TABS: 40.0000 mg | ORAL_TABLET | Freq: Every day | ORAL | 3 refills | Status: DC

## 2017-12-31 MED ORDER — POTASSIUM CHLORIDE CRYS ER 20 MEQ PO TBCR: 20.0000 meq | EXTENDED_RELEASE_TABLET | Freq: Two times a day (BID) | ORAL | Status: DC

## 2017-12-31 NOTE — Progress Notes (Signed)
Patient ID: Lindsay Harmon, female   DOB: 10/23/1956, 62 y.o.   MRN: [id number]    Advanced Heart Failure Clinic Note   PCP: Dr Silvio Pate  Primary HF  Cardiologist: Dr Haroldine Laws Pulmonary: [name]   HPI: Lindsay Harmon is a 62 y.o. female with history of HTN, DM, IBS, depression, GERD, severe COPD (FEV1 0.87L)  and pulmonary hypertension with RV failure/cor pulmonale, and chronic diastolic heart failure. .  Admitted in 8/16 with volume overload and severe hyponatremia. Diuresed with IV lasix and transtioned back torsemide and metolazone as needed. Discharge weight 153 pounds.   Admitted to [facility] in 12/16 with volume depletion and hyponatremia with Na 113. Hydrated. Has since followed up with Dr. Anthonette Legato in Renal. Now on torsemide 20 bid and occasional metolazone.   She presents today for HF follow up. SOB is about the same, maybe a bit worse. Limited activity with hip and back. SOB with walking and ADL's. Denies dizziness or syncope.  Uses a rollator. No CP, dizziness. Sleeps in a recliner due to back pain. Denies PND. Unable to lie flat. Minimal edema. Weights 165-169 lbs. Drinking lots of sweet tea. Quit smoking years ago.   - Echo 09/30/2017 LVEF 55-60%, Grade 1 DD, Mild/Mod AS Mean gradient 16 mmHg, Peak PA pressure 115 Hg  RHC 12/05/2014 RA 12 mmHg (mean) with O2 sat 72%; RV 97/16 mmHg with O2 sat 73%; PA 97/30 mmHg with O2 sat 67%; PCWP(mean) 14 mmHg (mean); Cardiac Output 5.78 L/min  PFTs 2/92016 with severe COPD  FEV1 0.87 (38%) FVC 1.41 (48%) FEF 25-75 0.41 (19%) Unable to do DLCO  Echo (2/16) EF 60-65%, aortic sclerosis, RV moderately dilated/moderately decreased systolic function, PASP 71 mmHg  06/14/15 K 4.6 Creatinine 2.00 12/11/2015: Na 128 K 3.8 Creatinine 2.01  Review of systems complete and found to be negative unless listed in HPI.    SH:  Social History   Socioeconomic History  . Marital status: Legally Separated    Spouse name: Not on file  . Number  of children: 1  . Years of education: Not on file  . Highest education level: Not on file  Social Needs  . Financial resource strain: Not on file  . Food insecurity - worry: Not on file  . Food insecurity - inability: Not on file  . Transportation needs - medical: Not on file  . Transportation needs - non-medical: Not on file  Occupational History  . 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 Smoking status: Former Smoker    Packs/day: 1.50    Years: 20.00    Pack years: 30.00    Types: Cigarettes    Last attempt to quit: 01/04/2009    Years since quitting: 8.9  . Smokeless tobacco: Never Used  Substance and Sexual Activity  . Alcohol use: No    Alcohol/week: 0.0 oz    Comment: heavy in the past  . Drug use: No  . Sexual activity: No  Other Topics Concern  . Not on file  Social History Narrative   No living will   Son [name] (then mom) should make decisions for her if she is unable.    Would accept resuscitation attempts but no prolonged ventilation   Not sure about tube feeds but probably wouldn't want them if cognitively unaware   FH:  Family History  Problem Relation Age of Onset  . Diabetes Mother   . Cancer Mother  ovarian,melanoma  . Allergies Father   . Cancer Father        lung  . Cancer Maternal Grandmother        uterine  . Emphysema Paternal Grandfather   . Allergies Sister   . Breast cancer Neg Hx     Past Medical History:  Diagnosis Date  . Allergy   . Arthritis   . CAD (coronary artery disease)   . COPD (chronic obstructive pulmonary disease) ([place])   . Depression   . Esophageal stricture   . Familial hematuria   . GERD (gastroesophageal reflux disease)   . Hyperlipidemia   . Hypertension   . IBS (irritable bowel syndrome)   . Nephrolithiasis   . NIDDM (non-insulin dependent diabetes mellitus)    with neuropathy  . Obesity, unspecified   . Pulmonary hypertension ([place])   . PVD (peripheral  vascular disease) ([place])    Current Outpatient Medications  Medication Sig Dispense Refill  . ALPRAZolam (XANAX) 0.25 MG tablet Take 0.25 mg by mouth daily as needed for anxiety.    Marland Kitchen aspirin 325 MG tablet Take 325 mg by mouth at bedtime.     . calcitRIOL (ROCALTROL) 0.5 MCG capsule     . cholecalciferol (VITAMIN D) 1000 units tablet Take 1,000 Units by mouth daily.    . enalapril (VASOTEC) 5 MG tablet Take 1 tablet (5 mg total) by mouth daily. 30 tablet 0  . gabapentin (NEURONTIN) 300 MG capsule Take 1 capsule by mouth in the morning, 1 capsule at noon, and 2 capsules at bedtime 360 capsule 2  . glipiZIDE (GLUCOTROL) 5 MG tablet Take 1 tablet (5 mg total) by mouth 2 (two) times daily before a meal. 180 tablet 1  . Insulin Glargine (LANTUS) 100 UNIT/ML Solostar Pen Inject 32 Units into the skin daily. 15 mL 11  . Insulin Pen Needle (SURE COMFORT PEN NEEDLES) 31G X 5 MM MISC USE 1 PEN NEEDLE TO INJECT INSULIN 90 each 3  . loratadine (CLARITIN) 10 MG tablet Take 10 mg by mouth 2 (two) times daily.     Marland Kitchen lovastatin (MEVACOR) 40 MG tablet Take 2 tablets (80 mg total) by mouth at bedtime. 180 tablet 3  . methadone (DOLOPHINE) 10 MG tablet Take 2 tablets (20 mg total) by mouth 4 (four) times daily. 240 tablet 0  . metoprolol tartrate (LOPRESSOR) 25 MG tablet Take 0.5 tablets (12.5 mg total) by mouth 2 (two) times daily. 90 tablet 2  . mirtazapine (REMERON) 30 MG tablet Take 15 mg by mouth at bedtime.    . nitroGLYCERIN (NITROSTAT) 0.4 MG SL tablet Place 1 tablet (0.4 mg total) under the tongue every 5 (five) minutes as needed for chest pain. 100 tablet 1  . ONE TOUCH ULTRA TEST test strip USE 1 STRIP TO TEST BLOOD SUGAR ONCE DAILY 100 each 2  . pantoprazole (PROTONIX) 40 MG tablet Take 1 tablet by mouth twice a day 180 tablet 3  . potassium chloride SA (K-DUR,KLOR-CON) 20 MEQ tablet Take 1 tablet by mouth 2 (two) times daily.    Marland Kitchen STIOLTO RESPIMAT 2.5-2.5 MCG/ACT AERS Inhale 2 puffs into the lungs  daily. 4 g 5  . torsemide (DEMADEX) 20 MG tablet Take 1 tab daily every other day ALTERNATING with 2 tabs daily every other day    . VENTOLIN HFA 108 (90 Base) MCG/ACT inhaler USE 2 PUFFS EVERY SIX HOURS AS NEEDED FOR WHEEZING OR SHORTNESS OF BREATH 18 g 3   No current facility-administered medications  for this encounter.    Vitals:   12/31/17 1343  BP: 112/68  Pulse: 87  SpO2: 91%  Weight: 173 lb 6.4 oz (78.7 kg)   Wt Readings from Last 3 Encounters:  12/31/17 173 lb 6.4 oz (78.7 kg)  11/04/17 166 lb (75.3 kg)  10/22/17 166 lb 12 oz (75.6 kg)    PHYSICAL EXAM: General:  Looks older than stated age. Chronically ill appearing. On O2. No resp difficulty. HEENT: Normal Neck: Supple. JVP 5-6. Carotids 2+ bilat; no bruits. No thyromegaly or nodule noted. Cor: PMI nondisplaced. RRR,  3/6 systolic murmur at LUSB Lungs: CTAB with markedly diminished BS no wheezing  Abdomen: Soft, non-tender, non-distended, no HSM. No bruits or masses. +BS  Extremities: No cyanosis, + clubbing, or rash. R and LLE +3 edema Neuro: Alert & orientedx3, cranial nerves grossly intact. moves all 4 extremities w/o difficulty. Affect pleasant   ASSESSMENT & PLAN: 1. Chronic Diastolic HF - Echo 62/0/3559 LVEF 55-60%, Grade 1 DD, Mild/Mod AS Mean gradient 16 mmHg, Peak PA pressure 115 Hg  - NYHA III-IIIBb chronically - Volume status elevated on exam - Increase torsemide to 40 mg daily.  - Start taking metolazone 2.5 mg every Wednesday x 4 weeks with kcl 20 2. PAH with cor pulmonale  - likely Who Group 3. Not candidate for selective pulmonary vasodilators - As above with elevated Peak PA pressure on Echo. Previously as high as 97 mmHg on RHC in 2016.  - Peak PA pressure of 115 on Echo 12/18. Consider repeat RHC once she is diuresed.  - With primarily WHO Group 3 disease will focus on COPD and diuresis.  - Could consider repeat PFTs (last on file 2016). She sees [name] next week.  - She has previously  established disease with known cause from COPD, no indication for immediate RHC.  3. Severe COPD - Wears 4L at rest, 5L with activity - As above, consider repeat PFT's. She sees [name] next week.  4. Chronic respiratory failure - Now up to 4L at rest, 5L with activity - Encouraged to check O2 sats at home.  5. Hyponatremia - Last Na 139 on 1/24 6. CKD III-IV - Has been seeing renal. Baseline seems to be slowly worsening and now over 2.0.  7. Mild/Mod AS - Mean gradient 16 mmHg - Could consider TEE to quantify further if needed, but would avoid procedures when possible with her lung disease.   Will leave medications the same for now with apparently stable volume status.  Will discuss case with Dr. Haroldine Laws. For now, plan RTC in 2-3 months with uptitration of O2 as directed by Dr. Lake Bells.   Georgiana Shore, NP  1:49 PM   Patient seen and examined with the above-signed Advanced Practice Provider and/or Housestaff. I personally reviewed laboratory data, imaging studies and relevant notes. I independently examined the patient and formulated the important aspects of the plan. I have edited the note to reflect any of my changes or salient points. I have personally discussed the plan with the patient and/or family.  Continues to struggle with NYHA III-IIIB symptoms due to COPD and related PAH. Seems to be a bit worse. On echo in 12/13 PA pressure now 115 which is up from 24mHG on RHC in 2016. On exam today has significant volume overload with 3+ edema. Weight up almost 15 pounds in 6 months. Will increase torsemide to 40 daily and have her take metolazone 2.591mtoday and then once a week with  k 20. No spiro with CKX. Watch closely to not overdiurese. Will consider repeat RHC after fluid off. Has f/u with Dr. Lake Bells who will repeat PFTs. If PA pressures getting worse may be worth cautious trial of Tyvaso. Continue O2. Encouraged her to check oxygen sats and make sure sats stay >= 88%. Cut fluid  intake. Watch aortic stenosis.   Total time spent 35 minutes. Over half that time spent discussing above.   Glori Bickers, MD  2:24 PM

## 2017-12-31 NOTE — Patient Instructions (Signed)
Increase Torsemide to 40 mg (2 tabs) daily  Start Metolazone 2.5 mg ONCE A WEEK FOR 4 WEEKS  Take Potassium 20 meq ONCE A WEEK when you take Metolazone  Please limit your fluid intake to 2 Liters a day  Your physician recommends that you schedule a follow-up appointment in: 2-3 weeks  Do the following things EVERYDAY: 1) Weigh yourself in the morning before breakfast. Write it down and keep it in a log. 2) Take your medicines as prescribed 3) Eat low salt foods-Limit salt (sodium) to 2000 mg per day.  4) Stay as active as you can everyday 5) Limit all fluids for the day to less than 2 liters

## 2018-01-04 DIAGNOSIS — J449 Chronic obstructive pulmonary disease, unspecified: Secondary | ICD-10-CM | POA: Diagnosis not present

## 2018-01-04 DIAGNOSIS — I509 Heart failure, unspecified: Secondary | ICD-10-CM | POA: Diagnosis not present

## 2018-01-05 ENCOUNTER — Other Ambulatory Visit (HOSPITAL_COMMUNITY): Payer: Self-pay | Admitting: Internal Medicine

## 2018-01-07 ENCOUNTER — Encounter: Payer: Self-pay | Admitting: Pulmonary Disease

## 2018-01-07 ENCOUNTER — Ambulatory Visit: Payer: PPO | Admitting: Pulmonary Disease

## 2018-01-07 VITALS — BP 108/76 | HR 87 | Ht 62.0 in | Wt 166.0 lb

## 2018-01-07 DIAGNOSIS — R0600 Dyspnea, unspecified: Secondary | ICD-10-CM | POA: Diagnosis not present

## 2018-01-07 DIAGNOSIS — J439 Emphysema, unspecified: Secondary | ICD-10-CM

## 2018-01-07 DIAGNOSIS — I2781 Cor pulmonale (chronic): Secondary | ICD-10-CM

## 2018-01-07 DIAGNOSIS — J449 Chronic obstructive pulmonary disease, unspecified: Secondary | ICD-10-CM

## 2018-01-07 NOTE — Patient Instructions (Signed)
Severe COPD: Continue taking Stiolto 2 puffs daily Continue to stay as active as possible  Chronic respiratory failure with hypoxemia: You should use 5 pulse oxygen continuously when you are using your pulse device When you are at home you can use 4 L continuous  Pulmonary hypertension: Continue to take the diuretic medicines as prescribed by Dr. Haroldine Laws I agree with plans to follow-up with him again in a few weeks and consider a repeat right heart catheterization  Follow-up with me in 6-8 weeks

## 2018-01-07 NOTE — Progress Notes (Signed)
Subjective:    Patient ID: Lindsay Harmon, female    DOB: 1956/04/29, 62 y.o.   MRN: [id number]   Synopsis: Referred by cardiology in 2016 for evaluation of severe COPD and possible obstructive sleep apnea in the setting of pulmonary hypertension. She also has congestive heart failure. A sleep study in 2016 showed no evidence of obstructive sleep apnea but she does have significant nocturnal hypoxemia. Spirometry testing in 2016 confirmed a diagnosis of COPD with severe airflow obstruction FEV1 41% pred.  She smoked cigarettes for many years and she quit smoking in 2013 after smoking 2 ppd for 40 years.   HPI Chief Complaint  Patient presents with  . Follow-up    Has felt good for the past 2 weeks    Lindzy saw cardiology last week and they adjusted her diuretics and she is now taking the metolazone weekly.  She lost nearly 7 pounds right away but her weight has crept back up some.  Her weight is still down to 165 pounds (down about 4 pounds).  She has a hacking cough with mucus in her throat.  She takes loratidine, no nose sprays.  She used to have GERD, but protonix makes this better.  She does not cough up mucus or feel congestion in her chest.    Past Medical History:  Diagnosis Date  . Allergy   . Arthritis   . CAD (coronary artery disease)   . COPD (chronic obstructive pulmonary disease) ([place])   . Depression   . Esophageal stricture   . Familial hematuria   . GERD (gastroesophageal reflux disease)   . Hyperlipidemia   . Hypertension   . IBS (irritable bowel syndrome)   . Nephrolithiasis   . NIDDM (non-insulin dependent diabetes mellitus)    with neuropathy  . Obesity, unspecified   . Pulmonary hypertension ([place])   . PVD (peripheral vascular disease) ([place])       Review of Systems  Constitutional: Positive for fatigue. Negative for diaphoresis and fever.  HENT: Negative for postnasal drip, rhinorrhea and sinus pressure.   Respiratory: Negative for cough, shortness of  breath and wheezing.   Cardiovascular: Negative for chest pain, palpitations and leg swelling.       Objective:   Physical Exam  Vitals:   01/07/18 1115  BP: 108/76  Pulse: 87  SpO2: 93%  Weight: 166 lb (75.3 kg)  Height: _0  (1.575 m)  4L [place] pulse > O2 saturation 80% walking in, 4L continuous flow> O2 went to 93%   Gen: chronically ill appearing HENT: OP clear, TM's clear, neck supple PULM: Crackles bases B, normal percussion CV: RRR, no mgr, trace edema GI: BS+, soft, nontender Derm: no cyanosis or rash Psyche: normal mood and affect    CBC    Component Value Date/Time   WBC 9.3 02/26/2017 1452   RBC 4.94 02/26/2017 1452   HGB 11.6 (L) 02/26/2017 1452   HCT 36.4 02/26/2017 1452   PLT 412 02/26/2017 1452   MCV 73.7 (L) 02/26/2017 1452   MCH 23.4 (L) 02/26/2017 1452   MCHC 31.8 (L) 02/26/2017 1452   RDW 20.0 (H) 02/26/2017 1452   LYMPHSABS 2.3 02/26/2017 1452   MONOABS 1.0 (H) 02/26/2017 1452   EOSABS 0.3 02/26/2017 1452   BASOSABS 0.1 02/26/2017 1452   Echo: December 2018 echocardiogram showed a normal LVEF RV was mildly dilated with an RVSP greater than 100  RHC 12/05/2014 RA 12 mmHg (mean) with O2 sat 72%; RV 97/16 mmHg with  O2 sat 73%; PA 97/30 mmHg with O2 sat 67%; PCWP(mean) 14 mmHg (mean); Cardiac Output 5.78 L/min  Chest imaging: CT scan chest June 2018 images independently reviewed showing mild to moderate centrilobular emphysema in the upper lobes, some mosaicism in the bases. Feb 2016 V/Q IMPRESSION: No evidence of pulmonary embolism.   Pulmonary function testing: March 2019 simple spirometry ratio 67%, FEV1 1.21 L 53% predicted, FVC 1.79 L 61% predicted  Records from her visit with Dr. Haroldine Laws reviewed from last week where she was more aggressively diuresed.     Assessment & Plan:   Dyspnea, unspecified type - Plan: Spirometry with Graph  Chronic obstructive pulmonary disease, unspecified COPD type ([place])  Pulmonary emphysema,  unspecified emphysema type ([place])  Cor pulmonale, chronic ([place])  Discussion: Obdulia has severe COPD, chronic respiratory failure with hypoxemia, and pulmonary hypertension.  I worry that the pulmonary hypertension is disproportionate to even her severe COPD.  A difficult situation as she clearly has had severe airflow obstruction for a long time but she does not have a phenotype of the typical patient we expect to see cor pulmonale (she lacks mucus production constant wheezing, recurrent exacerbations).  Spirometry testing today showed that her airflow obstruction has not worsened since last tested in 2016.  Severe COPD: Continue taking Stiolto 2 puffs daily Continue to stay as active as possible  Chronic respiratory failure with hypoxemia: You should use 5 pulse oxygen continuously when you are using your pulse device When you are at home you can use 4 L continuous  Pulmonary hypertension: Continue to take the diuretic medicines as prescribed by Dr. Haroldine Laws I agree with plans to follow-up with him again in a few weeks and consider a repeat right heart catheterization  Follow-up with me in 6-8 weeks  Current Outpatient Medications:  .  aspirin 325 MG tablet, Take 325 mg by mouth at bedtime. , Disp: , Rfl:  .  calcitRIOL (ROCALTROL) 0.5 MCG capsule, , Disp: , Rfl:  .  cholecalciferol (VITAMIN D) 1000 units tablet, Take 1,000 Units by mouth daily., Disp: , Rfl:  .  enalapril (VASOTEC) 5 MG tablet, Take 1 tablet (5 mg total) by mouth daily., Disp: 30 tablet, Rfl: 0 .  gabapentin (NEURONTIN) 300 MG capsule, Take 1 capsule by mouth in the morning, 1 capsule at noon, and 2 capsules at bedtime, Disp: 360 capsule, Rfl: 2 .  glipiZIDE (GLUCOTROL) 5 MG tablet, Take 1 tablet (5 mg total) by mouth 2 (two) times daily before a meal., Disp: 180 tablet, Rfl: 1 .  Insulin Glargine (LANTUS) 100 UNIT/ML Solostar Pen, Inject 32 Units into the skin daily., Disp: 15 mL, Rfl: 11 .  Insulin Pen Needle (SURE  COMFORT PEN NEEDLES) 31G X 5 MM MISC, USE 1 PEN NEEDLE TO INJECT INSULIN, Disp: 90 each, Rfl: 3 .  loratadine (CLARITIN) 10 MG tablet, Take 10 mg by mouth 2 (two) times daily. , Disp: , Rfl:  .  lovastatin (MEVACOR) 40 MG tablet, Take 2 tablets (80 mg total) by mouth at bedtime., Disp: 180 tablet, Rfl: 3 .  methadone (DOLOPHINE) 10 MG tablet, Take 2 tablets (20 mg total) by mouth 4 (four) times daily., Disp: 240 tablet, Rfl: 0 .  metolazone (ZAROXOLYN) 2.5 MG tablet, Take 1 tablet (2.5 mg total) by mouth once a week for 4 doses., Disp: 4 tablet, Rfl: 0 .  metoprolol tartrate (LOPRESSOR) 25 MG tablet, Take 0.5 tablets (12.5 mg total) by mouth 2 (two) times daily., Disp: 90 tablet, Rfl:  2 .  mirtazapine (REMERON) 30 MG tablet, Take 15 mg by mouth at bedtime., Disp: , Rfl:  .  nitroGLYCERIN (NITROSTAT) 0.4 MG SL tablet, Place 1 tablet (0.4 mg total) under the tongue every 5 (five) minutes as needed for chest pain., Disp: 100 tablet, Rfl: 1 .  ONE TOUCH ULTRA TEST test strip, USE 1 STRIP TO TEST BLOOD SUGAR ONCE DAILY, Disp: 100 each, Rfl: 2 .  pantoprazole (PROTONIX) 40 MG tablet, Take 1 tablet by mouth twice a day, Disp: 180 tablet, Rfl: 3 .  potassium chloride SA (K-DUR,KLOR-CON) 20 MEQ tablet, Take 1 tablet (20 mEq total) by mouth 2 (two) times daily. Take an extra tab when you take Metolazone, Disp: , Rfl:  .  STIOLTO RESPIMAT 2.5-2.5 MCG/ACT AERS, Inhale 2 puffs into the lungs daily., Disp: 4 g, Rfl: 5 .  torsemide (DEMADEX) 20 MG tablet, Take 2 tablets (40 mg total) by mouth daily., Disp: 60 tablet, Rfl: 3 .  VENTOLIN HFA 108 (90 Base) MCG/ACT inhaler, USE 2 PUFFS EVERY SIX HOURS AS NEEDED FOR WHEEZING OR SHORTNESS OF BREATH, Disp: 18 g, Rfl: 3

## 2018-01-20 ENCOUNTER — Encounter (HOSPITAL_COMMUNITY): Payer: Self-pay

## 2018-01-20 ENCOUNTER — Other Ambulatory Visit (HOSPITAL_COMMUNITY): Payer: Self-pay

## 2018-01-20 ENCOUNTER — Ambulatory Visit (HOSPITAL_COMMUNITY)
Admission: RE | Admit: 2018-01-20 | Discharge: 2018-01-20 | Disposition: A | Discharge status: Home / Self Care | Payer: PPO | Source: Ambulatory Visit | Attending: Internal Medicine | Admitting: Internal Medicine

## 2018-01-20 VITALS — BP 136/82 | HR 101 | Wt 168.0 lb

## 2018-01-20 DIAGNOSIS — K219 Gastro-esophageal reflux disease without esophagitis: Secondary | ICD-10-CM | POA: Diagnosis not present

## 2018-01-20 DIAGNOSIS — N184 Chronic kidney disease, stage 4 (severe): Secondary | ICD-10-CM | POA: Diagnosis not present

## 2018-01-20 DIAGNOSIS — E1151 Type 2 diabetes mellitus with diabetic peripheral angiopathy without gangrene: Secondary | ICD-10-CM | POA: Diagnosis not present

## 2018-01-20 DIAGNOSIS — I130 Hypertensive heart and chronic kidney disease with heart failure and stage 1 through stage 4 chronic kidney disease, or unspecified chronic kidney disease: Secondary | ICD-10-CM | POA: Insufficient documentation

## 2018-01-20 DIAGNOSIS — I509 Heart failure, unspecified: Secondary | ICD-10-CM | POA: Diagnosis not present

## 2018-01-20 DIAGNOSIS — E669 Obesity, unspecified: Secondary | ICD-10-CM | POA: Insufficient documentation

## 2018-01-20 DIAGNOSIS — I2510 Atherosclerotic heart disease of native coronary artery without angina pectoris: Secondary | ICD-10-CM | POA: Insufficient documentation

## 2018-01-20 DIAGNOSIS — Z79899 Other long term (current) drug therapy: Secondary | ICD-10-CM | POA: Insufficient documentation

## 2018-01-20 DIAGNOSIS — E871 Hypo-osmolality and hyponatremia: Secondary | ICD-10-CM | POA: Diagnosis not present

## 2018-01-20 DIAGNOSIS — I5032 Chronic diastolic (congestive) heart failure: Secondary | ICD-10-CM | POA: Diagnosis not present

## 2018-01-20 DIAGNOSIS — I272 Pulmonary hypertension, unspecified: Secondary | ICD-10-CM | POA: Diagnosis not present

## 2018-01-20 DIAGNOSIS — Z7982 Long term (current) use of aspirin: Secondary | ICD-10-CM | POA: Diagnosis not present

## 2018-01-20 DIAGNOSIS — Z794 Long term (current) use of insulin: Secondary | ICD-10-CM | POA: Insufficient documentation

## 2018-01-20 DIAGNOSIS — J449 Chronic obstructive pulmonary disease, unspecified: Secondary | ICD-10-CM | POA: Insufficient documentation

## 2018-01-20 DIAGNOSIS — I50810 Right heart failure, unspecified: Secondary | ICD-10-CM

## 2018-01-20 DIAGNOSIS — Z87891 Personal history of nicotine dependence: Secondary | ICD-10-CM | POA: Insufficient documentation

## 2018-01-20 DIAGNOSIS — E785 Hyperlipidemia, unspecified: Secondary | ICD-10-CM | POA: Diagnosis not present

## 2018-01-20 DIAGNOSIS — E1122 Type 2 diabetes mellitus with diabetic chronic kidney disease: Secondary | ICD-10-CM | POA: Insufficient documentation

## 2018-01-20 DIAGNOSIS — J9610 Chronic respiratory failure, unspecified whether with hypoxia or hypercapnia: Secondary | ICD-10-CM | POA: Insufficient documentation

## 2018-01-20 LAB — CBC
HEMATOCRIT: 33.8 % — AB (ref 36.0–46.0)
Hemoglobin: 9.8 g/dL — ABNORMAL LOW (ref 12.0–15.0)
MCH: 21.3 pg — ABNORMAL LOW (ref 26.0–34.0)
MCHC: 29 g/dL — ABNORMAL LOW (ref 30.0–36.0)
MCV: 73.5 fL — AB (ref 78.0–100.0)
Platelets: 261 10*3/uL (ref 150–400)
RBC: 4.6 MIL/uL (ref 3.87–5.11)
RDW: 18.5 % — AB (ref 11.5–15.5)
WBC: 7.4 10*3/uL (ref 4.0–10.5)

## 2018-01-20 LAB — BASIC METABOLIC PANEL
ANION GAP: 11 (ref 5–15)
BUN: 48 mg/dL — ABNORMAL HIGH (ref 6–20)
CO2: 30 mmol/L (ref 22–32)
Calcium: 8.8 mg/dL — ABNORMAL LOW (ref 8.9–10.3)
Chloride: 92 mmol/L — ABNORMAL LOW (ref 101–111)
Creatinine, Ser: 2.67 mg/dL — ABNORMAL HIGH (ref 0.44–1.00)
GFR calc Af Amer: 21 mL/min — ABNORMAL LOW (ref >60)
GFR, EST NON AFRICAN AMERICAN: 18 mL/min — AB (ref >60)
GLUCOSE: 164 mg/dL — AB (ref 65–99)
POTASSIUM: 3.8 mmol/L (ref 3.5–5.1)
SODIUM: 133 mmol/L — AB (ref 135–145)

## 2018-01-20 LAB — PROTIME-INR
INR: 1.44
Prothrombin Time: 17.4 seconds — ABNORMAL HIGH (ref 11.4–15.2)

## 2018-01-20 LAB — BRAIN NATRIURETIC PEPTIDE: B NATRIURETIC PEPTIDE 5: 561.7 pg/mL — AB (ref 0.0–100.0)

## 2018-01-20 NOTE — Progress Notes (Signed)
Patient ID: MYSTI HALEY, female   DOB: 12/19/1955, 62 y.o.   MRN: [id number]    Advanced Heart Failure Clinic Note   PCP: Dr Silvio Pate  Primary HF  Cardiologist: Dr Haroldine Laws Pulmonary: [name]   HPI: [name] is a 62 y.o. female with history of HTN, DM, IBS, depression, GERD, severe COPD (FEV1 0.87L)  and pulmonary hypertension with RV failure/cor pulmonale, and chronic diastolic heart failure.   Admitted in 8/16 with volume overload and severe hyponatremia. Diuresed with IV lasix and transtioned back torsemide and metolazone as needed. Discharge weight 153 pounds.   Admitted to [facility] in 12/16 with volume depletion and hyponatremia with Na 113. Hydrated. Has since followed up with Dr. Anthonette Legato in [place]. Now on torsemide 20 bid and occasional metolazone.   Today she returns for HF follow up. Recently saw [facility]. At that time oxygen was increased to 5 liters with exertion. PFTS completed with no real change from previous PFTs. Overall feeling fair. SOB with exertion but this is her baseline.  Denies PND/Orthopnea.  No presyncope/syncope. Appetite ok. No fever or chills. Weight at home 161-165 pounds. Taking all medications. Lives with her mother.    RHC 12/05/2014 RA 12 mmHg (mean) with O2 sat 72%; RV 97/16 mmHg with O2 sat 73%; PA 97/30 mmHg with O2 sat 67%; PCWP(mean) 14 mmHg (mean); Cardiac Output 5.78 L/min  PFTs  11/2014 with severe COPD  FEV1 0.87 (38%) FVC 1.41 (48%) FEF 25-75 0.41 (19%) Unable to do DLCO  March 2019  Simple spirometry ratio 67% FEV1 1.21 L 53% predicted FVC 1.79 L 61% predicted   Echo  11/2014 EF 60-65%, aortic sclerosis, RV moderately dilated/moderately decreased systolic function, PASP 71 mmHg  09/30/2017 LVEF 55-60%, Grade 1 DD, Mild/Mod AS Mean gradient 16 mmHg, Peak PA pressure 115 Hg  Review of systems complete and found to be negative unless listed in HPI.    SH:  Social History   Socioeconomic History  . Marital status:  Legally Separated    Spouse name: Not on file  . Number of children: 1  . Years of education: Not on file  . Highest education level: Not on file  Occupational History  . 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 Financial resource strain: Not on file  . Food insecurity:    Worry: Not on file    Inability: Not on file  . Transportation needs:    Medical: Not on file    Non-medical: Not on file  Tobacco Use  . Smoking status: Former Smoker    Packs/day: 1.50    Years: 20.00    Pack years: 30.00    Types: Cigarettes    Last attempt to quit: 01/04/2009    Years since quitting: 9.0  . Smokeless tobacco: Never Used  Substance and Sexual Activity  . Alcohol use: No    Alcohol/week: 0.0 oz    Comment: heavy in the past  . Drug use: No  . Sexual activity: Never  Lifestyle  . Physical activity:    Days per week: Not on file    Minutes per session: Not on file  . Stress: Not on file  Relationships  . Social connections:    Talks on phone: Not on file    Gets together: Not on file    Attends religious service: Not on file    Active member of club or organization: Not on file  Attends meetings of clubs or organizations: Not on file    Relationship status: Not on file  . Intimate partner violence:    Fear of current or ex partner: Not on file    Emotionally abused: Not on file    Physically abused: Not on file    Forced sexual activity: Not on file  Other Topics Concern  . Not on file  Social History Narrative   No living will   Son [name] (then mom) should make decisions for her if she is unable.    Would accept resuscitation attempts but no prolonged ventilation   Not sure about tube feeds but probably wouldn't want them if cognitively unaware   FH:  Family History  Problem Relation Age of Onset  . Diabetes Mother   . Cancer Mother        ovarian,melanoma  . Allergies Father   . Cancer Father        lung  . Cancer  Maternal Grandmother        uterine  . Emphysema Paternal Grandfather   . Allergies Sister   . Breast cancer Neg Hx     Past Medical History:  Diagnosis Date  . Allergy   . Arthritis   . CAD (coronary artery disease)   . COPD (chronic obstructive pulmonary disease) ([place])   . Depression   . Esophageal stricture   . Familial hematuria   . GERD (gastroesophageal reflux disease)   . Hyperlipidemia   . Hypertension   . IBS (irritable bowel syndrome)   . Nephrolithiasis   . NIDDM (non-insulin dependent diabetes mellitus)    with neuropathy  . Obesity, unspecified   . Pulmonary hypertension ([place])   . PVD (peripheral vascular disease) ([place])    Current Outpatient Medications  Medication Sig Dispense Refill  . aspirin 325 MG tablet Take 325 mg by mouth at bedtime.     . calcitRIOL (ROCALTROL) 0.5 MCG capsule     . cholecalciferol (VITAMIN D) 1000 units tablet Take 1,000 Units by mouth daily.    . enalapril (VASOTEC) 5 MG tablet Take 1 tablet (5 mg total) by mouth daily. 30 tablet 0  . gabapentin (NEURONTIN) 300 MG capsule Take 1 capsule by mouth in the morning, 1 capsule at noon, and 2 capsules at bedtime 360 capsule 2  . glipiZIDE (GLUCOTROL) 5 MG tablet Take 1 tablet (5 mg total) by mouth 2 (two) times daily before a meal. 180 tablet 1  . Insulin Glargine (LANTUS) 100 UNIT/ML Solostar Pen Inject 32 Units into the skin daily. 15 mL 11  . Insulin Pen Needle (SURE COMFORT PEN NEEDLES) 31G X 5 MM MISC USE 1 PEN NEEDLE TO INJECT INSULIN 90 each 3  . loratadine (CLARITIN) 10 MG tablet Take 10 mg by mouth 2 (two) times daily.     Marland Kitchen lovastatin (MEVACOR) 40 MG tablet Take 2 tablets (80 mg total) by mouth at bedtime. 180 tablet 3  . methadone (DOLOPHINE) 10 MG tablet Take 2 tablets (20 mg total) by mouth 4 (four) times daily. 240 tablet 0  . metolazone (ZAROXOLYN) 2.5 MG tablet Take 1 tablet (2.5 mg total) by mouth once a week for 4 doses. 4 tablet 0  . metoprolol tartrate (LOPRESSOR) 25 MG  tablet Take 0.5 tablets (12.5 mg total) by mouth 2 (two) times daily. 90 tablet 2  . mirtazapine (REMERON) 30 MG tablet Take 15 mg by mouth at bedtime.    . nitroGLYCERIN (NITROSTAT) 0.4 MG SL tablet Place  1 tablet (0.4 mg total) under the tongue every 5 (five) minutes as needed for chest pain. 100 tablet 1  . ONE TOUCH ULTRA TEST test strip USE 1 STRIP TO TEST BLOOD SUGAR ONCE DAILY 100 each 2  . pantoprazole (PROTONIX) 40 MG tablet Take 1 tablet by mouth twice a day 180 tablet 3  . potassium chloride SA (K-DUR,KLOR-CON) 20 MEQ tablet Take 1 tablet (20 mEq total) by mouth 2 (two) times daily. Take an extra tab when you take Metolazone    . STIOLTO RESPIMAT 2.5-2.5 MCG/ACT AERS Inhale 2 puffs into the lungs daily. 4 g 5  . torsemide (DEMADEX) 20 MG tablet Take 2 tablets (40 mg total) by mouth daily. 60 tablet 3  . torsemide (DEMADEX) 20 MG tablet Take 2 tablets (40 mg total) by mouth daily. 180 tablet 2  . VENTOLIN HFA 108 (90 Base) MCG/ACT inhaler USE 2 PUFFS EVERY SIX HOURS AS NEEDED FOR WHEEZING OR SHORTNESS OF BREATH 18 g 3   No current facility-administered medications for this encounter.    There were no vitals filed for this visit. Wt Readings from Last 3 Encounters:  01/07/18 166 lb (75.3 kg)  12/31/17 173 lb 6.4 oz (78.7 kg)  11/04/17 166 lb (75.3 kg)    PHYSICAL EXAM: General:  Appears chronically ill. No resp difficulty HEENT: normal Neck: supple. JVP 6-7. Carotids 2+ bilat; no bruits. No lymphadenopathy or thryomegaly appreciated. Cor: PMI nondisplaced. Regular rate & rhythm. No rubs, gallops. 3/6 systolic murmur  Lungs: clear on 5 liters oxygen.  Abdomen: soft, nontender, nondistended. No hepatosplenomegaly. No bruits or masses. Good bowel sounds. Extremities: no cyanosis, clubbing, rash, edema Neuro: alert & orientedx3, cranial nerves grossly intact. moves all 4 extremities w/o difficulty. Affect pleasant   ASSESSMENT & PLAN: 1. Chronic Diastolic HF - Echo 65/04/9037 LVEF  55-60%, Grade 1 DD, Mild/Mod AS Mean gradient 16 mmHg, Peak PA pressure 115 Hg -Volume status stable.  - Continue torsemide 40 mg daily + weekly metolazone.  -2. PAH with cor pulmonale  - likely Who Group 3. Not candidate for selective pulmonary vasodilators - As above with elevated Peak PA pressure on Echo. Previously as high as 97 mmHg on RHC in 2016.  - Peak PA pressure of 115 on Echo 12/18.   - With primarily WHO Group 3 disease will focus on COPD and diuresis.  - Set up for RHC this week to further evaluate hemodynamics.  3. Severe COPD - 5 liters with exertion and 4 liters at home.  PFTS reviewed from earlier this monht.   4. Chronic respiratory failure - Now up to 4L at rest, 5L with activity - Sats stable.  5. Hyponatremia -check bmet today.  6. CKD III-IV - Has been seeing renal. Baseline seems to be slowly worsening and now over 2.0.  - Check BMET today.  7. Mild/Mod AS - Mean gradient 16 mmHg - Could consider TEE to quantify further if needed, but would avoid procedures when possible with her lung disease.    Set up for RHC this week. Greater than 50% of the (total minutes 25) visit spent in counseling/coordination of care regarding RHC testing and labs.   Follow up in 4 weeks.   Darrick Grinder, NP  2:00 PM

## 2018-01-20 NOTE — H&P (View-Only) (Signed)
Patient ID: Lindsay Harmon, female   DOB: 12/19/1955, 62 y.o.   MRN: [id number]    Advanced Heart Failure Clinic Note   PCP: Dr Silvio Pate  Primary HF  Cardiologist: Dr Haroldine Laws Pulmonary: [name]   HPI: LINN Harmon is a 62 y.o. female with history of HTN, DM, IBS, depression, GERD, severe COPD (FEV1 0.87L)  and pulmonary hypertension with RV failure/cor pulmonale, and chronic diastolic heart failure.   Admitted in 8/16 with volume overload and severe hyponatremia. Diuresed with IV lasix and transtioned back torsemide and metolazone as needed. Discharge weight 153 pounds.   Admitted to [facility] in 12/16 with volume depletion and hyponatremia with Na 113. Hydrated. Has since followed up with Dr. Anthonette Legato in [place]. Now on torsemide 20 bid and occasional metolazone.   Today she returns for HF follow up. Recently saw [employer]. At that time oxygen was increased to 5 liters with exertion. PFTS completed with no real change from previous PFTs. Overall feeling fair. SOB with exertion but this is her baseline.  Denies PND/Orthopnea.  No presyncope/syncope. Appetite ok. No fever or chills. Weight at home 161-165 pounds. Taking all medications. Lives with her mother.    RHC 12/05/2014 RA 12 mmHg (mean) with O2 sat 72%; RV 97/16 mmHg with O2 sat 73%; PA 97/30 mmHg with O2 sat 67%; PCWP(mean) 14 mmHg (mean); Cardiac Output 5.78 L/min  PFTs  11/2014 with severe COPD  FEV1 0.87 (38%) FVC 1.41 (48%) FEF 25-75 0.41 (19%) Unable to do DLCO  March 2019  Simple spirometry ratio 67% FEV1 1.21 L 53% predicted FVC 1.79 L 61% predicted   Echo  11/2014 EF 60-65%, aortic sclerosis, RV moderately dilated/moderately decreased systolic function, PASP 71 mmHg  09/30/2017 LVEF 55-60%, Grade 1 DD, Mild/Mod AS Mean gradient 16 mmHg, Peak PA pressure 115 Hg  Review of systems complete and found to be negative unless listed in HPI.    SH:  Social History   Socioeconomic History  . Marital status:  Legally Separated    Spouse name: Not on file  . Number of children: 1  . Years of education: Not on file  . Highest education level: Not on file  Occupational History  . 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 Financial resource strain: Not on file  . Food insecurity:    Worry: Not on file    Inability: Not on file  . Transportation needs:    Medical: Not on file    Non-medical: Not on file  Tobacco Use  . Smoking status: Former Smoker    Packs/day: 1.50    Years: 20.00    Pack years: 30.00    Types: Cigarettes    Last attempt to quit: 01/04/2009    Years since quitting: 9.0  . Smokeless tobacco: Never Used  Substance and Sexual Activity  . Alcohol use: No    Alcohol/week: 0.0 oz    Comment: heavy in the past  . Drug use: No  . Sexual activity: Never  Lifestyle  . Physical activity:    Days per week: Not on file    Minutes per session: Not on file  . Stress: Not on file  Relationships  . Social connections:    Talks on phone: Not on file    Gets together: Not on file    Attends religious service: Not on file    Active member of club or organization: Not on file  Attends meetings of clubs or organizations: Not on file    Relationship status: Not on file  . Intimate partner violence:    Fear of current or ex partner: Not on file    Emotionally abused: Not on file    Physically abused: Not on file    Forced sexual activity: Not on file  Other Topics Concern  . Not on file  Social History Narrative   No living will   Son [name] (then mom) should make decisions for her if she is unable.    Would accept resuscitation attempts but no prolonged ventilation   Not sure about tube feeds but probably wouldn't want them if cognitively unaware   FH:  Family History  Problem Relation Age of Onset  . Diabetes Mother   . Cancer Mother        ovarian,melanoma  . Allergies Father   . Cancer Father        lung  . Cancer  Maternal Grandmother        uterine  . Emphysema Paternal Grandfather   . Allergies Sister   . Breast cancer Neg Hx     Past Medical History:  Diagnosis Date  . Allergy   . Arthritis   . CAD (coronary artery disease)   . COPD (chronic obstructive pulmonary disease) ([place])   . Depression   . Esophageal stricture   . Familial hematuria   . GERD (gastroesophageal reflux disease)   . Hyperlipidemia   . Hypertension   . IBS (irritable bowel syndrome)   . Nephrolithiasis   . NIDDM (non-insulin dependent diabetes mellitus)    with neuropathy  . Obesity, unspecified   . Pulmonary hypertension ([place])   . PVD (peripheral vascular disease) ([place])    Current Outpatient Medications  Medication Sig Dispense Refill  . aspirin 325 MG tablet Take 325 mg by mouth at bedtime.     . calcitRIOL (ROCALTROL) 0.5 MCG capsule     . cholecalciferol (VITAMIN D) 1000 units tablet Take 1,000 Units by mouth daily.    . enalapril (VASOTEC) 5 MG tablet Take 1 tablet (5 mg total) by mouth daily. 30 tablet 0  . gabapentin (NEURONTIN) 300 MG capsule Take 1 capsule by mouth in the morning, 1 capsule at noon, and 2 capsules at bedtime 360 capsule 2  . glipiZIDE (GLUCOTROL) 5 MG tablet Take 1 tablet (5 mg total) by mouth 2 (two) times daily before a meal. 180 tablet 1  . Insulin Glargine (LANTUS) 100 UNIT/ML Solostar Pen Inject 32 Units into the skin daily. 15 mL 11  . Insulin Pen Needle (SURE COMFORT PEN NEEDLES) 31G X 5 MM MISC USE 1 PEN NEEDLE TO INJECT INSULIN 90 each 3  . loratadine (CLARITIN) 10 MG tablet Take 10 mg by mouth 2 (two) times daily.     Marland Kitchen lovastatin (MEVACOR) 40 MG tablet Take 2 tablets (80 mg total) by mouth at bedtime. 180 tablet 3  . methadone (DOLOPHINE) 10 MG tablet Take 2 tablets (20 mg total) by mouth 4 (four) times daily. 240 tablet 0  . metolazone (ZAROXOLYN) 2.5 MG tablet Take 1 tablet (2.5 mg total) by mouth once a week for 4 doses. 4 tablet 0  . metoprolol tartrate (LOPRESSOR) 25 MG  tablet Take 0.5 tablets (12.5 mg total) by mouth 2 (two) times daily. 90 tablet 2  . mirtazapine (REMERON) 30 MG tablet Take 15 mg by mouth at bedtime.    . nitroGLYCERIN (NITROSTAT) 0.4 MG SL tablet Place  1 tablet (0.4 mg total) under the tongue every 5 (five) minutes as needed for chest pain. 100 tablet 1  . ONE TOUCH ULTRA TEST test strip USE 1 STRIP TO TEST BLOOD SUGAR ONCE DAILY 100 each 2  . pantoprazole (PROTONIX) 40 MG tablet Take 1 tablet by mouth twice a day 180 tablet 3  . potassium chloride SA (K-DUR,KLOR-CON) 20 MEQ tablet Take 1 tablet (20 mEq total) by mouth 2 (two) times daily. Take an extra tab when you take Metolazone    . STIOLTO RESPIMAT 2.5-2.5 MCG/ACT AERS Inhale 2 puffs into the lungs daily. 4 g 5  . torsemide (DEMADEX) 20 MG tablet Take 2 tablets (40 mg total) by mouth daily. 60 tablet 3  . torsemide (DEMADEX) 20 MG tablet Take 2 tablets (40 mg total) by mouth daily. 180 tablet 2  . VENTOLIN HFA 108 (90 Base) MCG/ACT inhaler USE 2 PUFFS EVERY SIX HOURS AS NEEDED FOR WHEEZING OR SHORTNESS OF BREATH 18 g 3   No current facility-administered medications for this encounter.    There were no vitals filed for this visit. Wt Readings from Last 3 Encounters:  01/07/18 166 lb (75.3 kg)  12/31/17 173 lb 6.4 oz (78.7 kg)  11/04/17 166 lb (75.3 kg)    PHYSICAL EXAM: General:  Appears chronically ill. No resp difficulty HEENT: normal Neck: supple. JVP 6-7. Carotids 2+ bilat; no bruits. No lymphadenopathy or thryomegaly appreciated. Cor: PMI nondisplaced. Regular rate & rhythm. No rubs, gallops. 3/6 systolic murmur  Lungs: clear on 5 liters oxygen.  Abdomen: soft, nontender, nondistended. No hepatosplenomegaly. No bruits or masses. Good bowel sounds. Extremities: no cyanosis, clubbing, rash, edema Neuro: alert & orientedx3, cranial nerves grossly intact. moves all 4 extremities w/o difficulty. Affect pleasant   ASSESSMENT & PLAN: 1. Chronic Diastolic HF - Echo 65/04/9037 LVEF  55-60%, Grade 1 DD, Mild/Mod AS Mean gradient 16 mmHg, Peak PA pressure 115 Hg -Volume status stable.  - Continue torsemide 40 mg daily + weekly metolazone.  -2. PAH with cor pulmonale  - likely Who Group 3. Not candidate for selective pulmonary vasodilators - As above with elevated Peak PA pressure on Echo. Previously as high as 97 mmHg on RHC in 2016.  - Peak PA pressure of 115 on Echo 12/18.   - With primarily WHO Group 3 disease will focus on COPD and diuresis.  - Set up for RHC this week to further evaluate hemodynamics.  3. Severe COPD - 5 liters with exertion and 4 liters at home.  PFTS reviewed from earlier this monht.   4. Chronic respiratory failure - Now up to 4L at rest, 5L with activity - Sats stable.  5. Hyponatremia -check bmet today.  6. CKD III-IV - Has been seeing renal. Baseline seems to be slowly worsening and now over 2.0.  - Check BMET today.  7. Mild/Mod AS - Mean gradient 16 mmHg - Could consider TEE to quantify further if needed, but would avoid procedures when possible with her lung disease.    Set up for RHC this week. Greater than 50% of the (total minutes 25) visit spent in counseling/coordination of care regarding RHC testing and labs.   Follow up in 4 weeks.   Darrick Grinder, NP  2:00 PM

## 2018-01-20 NOTE — Patient Instructions (Addendum)
Routine lab work today. Will notify you of abnormal results, otherwise no news is good news!  No changes to medication at this time.  You have been scheduled for a heart catheterization. Please see instruction sheet for additional details.  Follow up 1 month with Dr. Haroldine Laws.  _________________________________________________________ Lindsay Harmon Code: 1100  Take all medication as prescribed the day of your appointment. Bring all medications with you to your appointment.  Do the following things EVERYDAY: 1) Weigh yourself in the morning before breakfast. Write it down and keep it in a log. 2) Take your medicines as prescribed 3) Eat low salt foods-Limit salt (sodium) to 2000 mg per day.  4) Stay as active as you can everyday 5) Limit all fluids for the day to less than 2 liters

## 2018-01-21 ENCOUNTER — Encounter: Payer: Self-pay | Admitting: Internal Medicine

## 2018-01-21 ENCOUNTER — Ambulatory Visit (INDEPENDENT_AMBULATORY_CARE_PROVIDER_SITE_OTHER): Payer: PPO | Admitting: Internal Medicine

## 2018-01-21 VITALS — BP 102/66 | HR 85 | Temp 98.1°F | Ht 62.0 in | Wt 167.0 lb

## 2018-01-21 DIAGNOSIS — F1120 Opioid dependence, uncomplicated: Secondary | ICD-10-CM | POA: Diagnosis not present

## 2018-01-21 DIAGNOSIS — M5440 Lumbago with sciatica, unspecified side: Secondary | ICD-10-CM

## 2018-01-21 DIAGNOSIS — G8929 Other chronic pain: Secondary | ICD-10-CM

## 2018-01-21 DIAGNOSIS — M7061 Trochanteric bursitis, right hip: Secondary | ICD-10-CM | POA: Diagnosis not present

## 2018-01-21 MED ORDER — METHADONE HCL 10 MG PO TABS: 20.0000 mg | ORAL_TABLET | Freq: Four times a day (QID) | ORAL | 0 refills | Status: DC

## 2018-01-21 NOTE — Progress Notes (Signed)
Subjective:    Patient ID: Lindsay Harmon, female    DOB: 29-Nov-1955, 62 y.o.   MRN: [id number]  HPI Here for follow up on chronic pain and narcotic dependence  Having some increased trouble with right hip pain Especially bad when she tries to walk Now most of the day--even without walking Just on her usual medications  Chronic back pain She feels satisfied that this is controlled with the methadone Still some bad days Still does all shopping and housework  Current Outpatient Medications on File Prior to Visit  Medication Sig Dispense Refill  . aspirin 325 MG tablet Take 325 mg by mouth at bedtime.     . calcitRIOL (ROCALTROL) 0.5 MCG capsule Take 0.5 mcg by mouth daily.     . cholecalciferol (VITAMIN D) 1000 units tablet Take 1,000 Units by mouth at bedtime.     . enalapril (VASOTEC) 5 MG tablet Take 1 tablet (5 mg total) by mouth daily. 30 tablet 0  . gabapentin (NEURONTIN) 300 MG capsule Take 1 capsule by mouth in the morning, 1 capsule at noon, and 2 capsules at bedtime (Patient taking differently: Take 300-600 mg by mouth See admin instructions. Take 1 capsule by mouth in the morning, 1 capsule at noon, and 2 capsules at bedtime) 360 capsule 2  . glipiZIDE (GLUCOTROL) 5 MG tablet Take 1 tablet (5 mg total) by mouth 2 (two) times daily before a meal. 180 tablet 1  . Insulin Glargine (LANTUS) 100 UNIT/ML Solostar Pen Inject 32 Units into the skin daily. (Patient taking differently: Inject 30 Units into the skin daily. ) 15 mL 11  . Insulin Pen Needle (SURE COMFORT PEN NEEDLES) 31G X 5 MM MISC USE 1 PEN NEEDLE TO INJECT INSULIN 90 each 3  . loratadine (CLARITIN) 10 MG tablet Take 10 mg by mouth 2 (two) times daily.     Marland Kitchen lovastatin (MEVACOR) 40 MG tablet Take 2 tablets (80 mg total) by mouth at bedtime. 180 tablet 3  . methadone (DOLOPHINE) 10 MG tablet Take 2 tablets (20 mg total) by mouth 4 (four) times daily. 240 tablet 0  . metolazone (ZAROXOLYN) 2.5 MG tablet Take 1 tablet (2.5  mg total) by mouth once a week for 4 doses. 4 tablet 0  . metoprolol tartrate (LOPRESSOR) 25 MG tablet Take 0.5 tablets (12.5 mg total) by mouth 2 (two) times daily. 90 tablet 2  . mirtazapine (REMERON) 30 MG tablet Take 15 mg by mouth at bedtime.    . nitroGLYCERIN (NITROSTAT) 0.4 MG SL tablet Place 1 tablet (0.4 mg total) under the tongue every 5 (five) minutes as needed for chest pain. 100 tablet 1  . ONE TOUCH ULTRA TEST test strip USE 1 STRIP TO TEST BLOOD SUGAR ONCE DAILY 100 each 2  . pantoprazole (PROTONIX) 40 MG tablet Take 1 tablet by mouth twice a day (Patient taking differently: Take 1 tablet (56m) by mouth twice a day before meals) 180 tablet 3  . potassium chloride SA (K-DUR,KLOR-CON) 20 MEQ tablet Take 1 tablet (20 mEq total) by mouth 2 (two) times daily. Take an extra tab when you take Metolazone (Patient taking differently: Take 20 mEq by mouth See admin instructions. Take with each metolazone dose.)    . STIOLTO RESPIMAT 2.5-2.5 MCG/ACT AERS Inhale 2 puffs into the lungs daily. 4 g 5  . torsemide (DEMADEX) 20 MG tablet Take 2 tablets (40 mg total) by mouth daily. 180 tablet 2  . VENTOLIN HFA 108 (90 Base) MCG/ACT  inhaler USE 2 PUFFS EVERY SIX HOURS AS NEEDED FOR WHEEZING OR SHORTNESS OF BREATH 18 g 3   No current facility-administered medications on file prior to visit.     Allergies  Allergen Reactions  . Sertraline Hcl Other (See Comments)    Cloudy thoughts, malaise    Past Medical History:  Diagnosis Date  . Allergy   . Arthritis   . CAD (coronary artery disease)   . COPD (chronic obstructive pulmonary disease) ([place])   . Depression   . Esophageal stricture   . Familial hematuria   . GERD (gastroesophageal reflux disease)   . Hyperlipidemia   . Hypertension   . IBS (irritable bowel syndrome)   . Nephrolithiasis   . NIDDM (non-insulin dependent diabetes mellitus)    with neuropathy  . Obesity, unspecified   . Pulmonary hypertension ([place])   . PVD (peripheral  vascular disease) ([place])     Past Surgical History:  Procedure Laterality Date  . ABDOMINAL HYSTERECTOMY    . CARPAL TUNNEL RELEASE    . CESAREAN SECTION    . ERCP N/A 03/17/2017   Procedure: ENDOSCOPIC RETROGRADE CHOLANGIOPANCREATOGRAPHY (ERCP);  Surgeon: [name], MD;  Location: [facility] ENDOSCOPY;  Service: Endoscopy;  Laterality: N/A;  . EUS N/A 03/13/2017   Procedure: FULL UPPER ENDOSCOPIC ULTRASOUND (EUS) RADIAL;  Surgeon: Burbridge, Murray Hodgkins, MD;  Location: [facility] ENDOSCOPY;  Service: Endoscopy;  Laterality: N/A;  . RIGHT HEART CATHETERIZATION N/A 12/05/2014   Procedure: RIGHT HEART CATH;  Surgeon: Sinclair Grooms, MD;  Location: [facility] CATH LAB;  Service: Cardiovascular;  Laterality: N/A;    Family History  Problem Relation Age of Onset  . Diabetes Mother   . Cancer Mother        ovarian,melanoma  . Allergies Father   . Cancer Father        lung  . Cancer Maternal Grandmother        uterine  . Emphysema Paternal Grandfather   . Allergies Sister   . Breast cancer Neg Hx     Social History   Socioeconomic History  . Marital status: Legally Separated    Spouse name: Not on file  . Number of children: 1  . Years of education: Not on file  . Highest education level: Not on file  Occupational History  . 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 Financial resource strain: Not on file  . Food insecurity:    Worry: Not on file    Inability: Not on file  . Transportation needs:    Medical: Not on file    Non-medical: Not on file  Tobacco Use  . Smoking status: Former Smoker    Packs/day: 1.50    Years: 20.00    Pack years: 30.00    Types: Cigarettes    Last attempt to quit: 01/04/2009    Years since quitting: 9.0  . Smokeless tobacco: Never Used  Substance and Sexual Activity  . Alcohol use: No    Alcohol/week: 0.0 oz    Comment: heavy in the past  . Drug use: No  . Sexual activity: Never  Lifestyle  . Physical  activity:    Days per week: Not on file    Minutes per session: Not on file  . Stress: Not on file  Relationships  . Social connections:    Talks on phone: Not on file    Gets together: Not on file    Attends  religious service: Not on file    Active member of club or organization: Not on file    Attends meetings of clubs or organizations: Not on file    Relationship status: Not on file  . Intimate partner violence:    Fear of current or ex partner: Not on file    Emotionally abused: Not on file    Physically abused: Not on file    Forced sexual activity: Not on file  Other Topics Concern  . Not on file  Social History Narrative   No living will   Son [name] (then mom) should make decisions for her if she is unable.    Would accept resuscitation attempts but no prolonged ventilation   Not sure about tube feeds but probably wouldn't want them if cognitively unaware   Review of Systems  Appetite is okay Weight stable Sugars generally okay--- 92-181 with most under 150 2 mild hypoglycemic spells (in 70's)     Objective:   Physical Exam  Constitutional: No distress.  Musculoskeletal:  Tenderness along lateral right hip Fair ROM --only mild decrease in internal rotation without pain  Neurological:  Fairly normal gait          Assessment & Plan:

## 2018-01-21 NOTE — Assessment & Plan Note (Signed)
Maintains normal function on her regimen

## 2018-01-21 NOTE — Assessment & Plan Note (Signed)
Doesn't seem to be arthritic Discussed trying ice prn when the pain is worse

## 2018-01-21 NOTE — Assessment & Plan Note (Signed)
CSRS reviewed--nothing of concern Will be due for UDS/contract next time

## 2018-01-22 ENCOUNTER — Encounter (HOSPITAL_COMMUNITY): Payer: Self-pay | Admitting: Internal Medicine

## 2018-01-22 ENCOUNTER — Encounter (HOSPITAL_COMMUNITY)
Admission: RE | Disposition: A | Discharge status: Home / Self Care | Payer: Self-pay | Source: Ambulatory Visit | Attending: Internal Medicine

## 2018-01-22 ENCOUNTER — Ambulatory Visit (HOSPITAL_COMMUNITY)
Admission: RE | Admit: 2018-01-22 | Discharge: 2018-01-22 | Disposition: A | Discharge status: Home / Self Care | Payer: PPO | Source: Ambulatory Visit | Attending: Internal Medicine | Admitting: Internal Medicine

## 2018-01-22 DIAGNOSIS — I272 Pulmonary hypertension, unspecified: Secondary | ICD-10-CM | POA: Diagnosis present

## 2018-01-22 DIAGNOSIS — Z6830 Body mass index (BMI) 30.0-30.9, adult: Secondary | ICD-10-CM | POA: Diagnosis not present

## 2018-01-22 DIAGNOSIS — Z8489 Family history of other specified conditions: Secondary | ICD-10-CM | POA: Insufficient documentation

## 2018-01-22 DIAGNOSIS — J9610 Chronic respiratory failure, unspecified whether with hypoxia or hypercapnia: Secondary | ICD-10-CM | POA: Insufficient documentation

## 2018-01-22 DIAGNOSIS — N184 Chronic kidney disease, stage 4 (severe): Secondary | ICD-10-CM | POA: Diagnosis not present

## 2018-01-22 DIAGNOSIS — K219 Gastro-esophageal reflux disease without esophagitis: Secondary | ICD-10-CM | POA: Diagnosis not present

## 2018-01-22 DIAGNOSIS — Z8041 Family history of malignant neoplasm of ovary: Secondary | ICD-10-CM | POA: Diagnosis not present

## 2018-01-22 DIAGNOSIS — Z794 Long term (current) use of insulin: Secondary | ICD-10-CM | POA: Diagnosis not present

## 2018-01-22 DIAGNOSIS — I2510 Atherosclerotic heart disease of native coronary artery without angina pectoris: Secondary | ICD-10-CM | POA: Insufficient documentation

## 2018-01-22 DIAGNOSIS — J449 Chronic obstructive pulmonary disease, unspecified: Secondary | ICD-10-CM | POA: Diagnosis not present

## 2018-01-22 DIAGNOSIS — K589 Irritable bowel syndrome without diarrhea: Secondary | ICD-10-CM | POA: Insufficient documentation

## 2018-01-22 DIAGNOSIS — I2721 Secondary pulmonary arterial hypertension: Secondary | ICD-10-CM | POA: Diagnosis not present

## 2018-01-22 DIAGNOSIS — M1990 Unspecified osteoarthritis, unspecified site: Secondary | ICD-10-CM | POA: Insufficient documentation

## 2018-01-22 DIAGNOSIS — E1122 Type 2 diabetes mellitus with diabetic chronic kidney disease: Secondary | ICD-10-CM | POA: Diagnosis not present

## 2018-01-22 DIAGNOSIS — E785 Hyperlipidemia, unspecified: Secondary | ICD-10-CM | POA: Diagnosis not present

## 2018-01-22 DIAGNOSIS — Z7982 Long term (current) use of aspirin: Secondary | ICD-10-CM | POA: Insufficient documentation

## 2018-01-22 DIAGNOSIS — I700 Atherosclerosis of aorta: Secondary | ICD-10-CM | POA: Insufficient documentation

## 2018-01-22 DIAGNOSIS — Z79899 Other long term (current) drug therapy: Secondary | ICD-10-CM | POA: Insufficient documentation

## 2018-01-22 DIAGNOSIS — Z87891 Personal history of nicotine dependence: Secondary | ICD-10-CM | POA: Diagnosis not present

## 2018-01-22 DIAGNOSIS — I5032 Chronic diastolic (congestive) heart failure: Secondary | ICD-10-CM | POA: Diagnosis not present

## 2018-01-22 DIAGNOSIS — Z87442 Personal history of urinary calculi: Secondary | ICD-10-CM | POA: Insufficient documentation

## 2018-01-22 DIAGNOSIS — Z801 Family history of malignant neoplasm of trachea, bronchus and lung: Secondary | ICD-10-CM | POA: Insufficient documentation

## 2018-01-22 DIAGNOSIS — F329 Major depressive disorder, single episode, unspecified: Secondary | ICD-10-CM | POA: Diagnosis not present

## 2018-01-22 DIAGNOSIS — Z833 Family history of diabetes mellitus: Secondary | ICD-10-CM | POA: Diagnosis not present

## 2018-01-22 DIAGNOSIS — I130 Hypertensive heart and chronic kidney disease with heart failure and stage 1 through stage 4 chronic kidney disease, or unspecified chronic kidney disease: Secondary | ICD-10-CM | POA: Insufficient documentation

## 2018-01-22 DIAGNOSIS — E871 Hypo-osmolality and hyponatremia: Secondary | ICD-10-CM | POA: Insufficient documentation

## 2018-01-22 DIAGNOSIS — E1151 Type 2 diabetes mellitus with diabetic peripheral angiopathy without gangrene: Secondary | ICD-10-CM | POA: Diagnosis not present

## 2018-01-22 DIAGNOSIS — Z825 Family history of asthma and other chronic lower respiratory diseases: Secondary | ICD-10-CM | POA: Insufficient documentation

## 2018-01-22 DIAGNOSIS — E669 Obesity, unspecified: Secondary | ICD-10-CM | POA: Diagnosis not present

## 2018-01-22 HISTORY — PX: RIGHT HEART CATH: CATH118263

## 2018-01-22 LAB — POCT I-STAT 3, VENOUS BLOOD GAS (G3P V)
ACID-BASE EXCESS: 8 mmol/L — AB (ref 0.0–2.0)
Acid-Base Excess: 9 mmol/L — ABNORMAL HIGH (ref 0.0–2.0)
Acid-Base Excess: 9 mmol/L — ABNORMAL HIGH (ref 0.0–2.0)
BICARBONATE: 35.3 mmol/L — AB (ref 20.0–28.0)
Bicarbonate: 35.5 mmol/L — ABNORMAL HIGH (ref 20.0–28.0)
Bicarbonate: 36 mmol/L — ABNORMAL HIGH (ref 20.0–28.0)
O2 Saturation: 59 %
O2 Saturation: 62 %
O2 Saturation: 64 %
PCO2 VEN: 58.6 mmHg (ref 44.0–60.0)
PCO2 VEN: 61.1 mmHg — AB (ref 44.0–60.0)
PH VEN: 7.382 (ref 7.250–7.430)
PH VEN: 7.388 (ref 7.250–7.430)
PO2 VEN: 34 mmHg (ref 32.0–45.0)
PO2 VEN: 35 mmHg (ref 32.0–45.0)
TCO2: 37 mmol/L — ABNORMAL HIGH (ref 22–32)
TCO2: 37 mmol/L — ABNORMAL HIGH (ref 22–32)
TCO2: 38 mmol/L — ABNORMAL HIGH (ref 22–32)
pCO2, Ven: 59.8 mmHg (ref 44.0–60.0)
pH, Ven: 7.378 (ref 7.250–7.430)
pO2, Ven: 32 mmHg (ref 32.0–45.0)

## 2018-01-22 LAB — GLUCOSE, CAPILLARY: Glucose-Capillary: 103 mg/dL — ABNORMAL HIGH (ref 65–99)

## 2018-01-22 SURGERY — RIGHT HEART CATH
Anesthesia: LOCAL

## 2018-01-22 MED ORDER — SODIUM CHLORIDE 0.9 % IV SOLN: 250.0000 mL | INTRAVENOUS | Status: DC | PRN

## 2018-01-22 MED ORDER — LIDOCAINE HCL (PF) 1 % IJ SOLN
INTRAMUSCULAR | Status: DC | PRN
  Administered 2018-01-22: 2 mL

## 2018-01-22 MED ORDER — SODIUM CHLORIDE 0.9% FLUSH: 3.0000 mL | INTRAVENOUS | Status: DC | PRN

## 2018-01-22 MED ORDER — HEPARIN (PORCINE) IN NACL 2-0.9 UNIT/ML-% IJ SOLN
INTRAMUSCULAR | Status: AC
  Filled 2018-01-22: qty 1000

## 2018-01-22 MED ORDER — HEPARIN (PORCINE) IN NACL 2-0.9 UNIT/ML-% IJ SOLN
INTRAMUSCULAR | Status: AC | PRN
  Administered 2018-01-22: 500 mL

## 2018-01-22 MED ORDER — SODIUM CHLORIDE 0.9% FLUSH: 3.0000 mL | Freq: Two times a day (BID) | INTRAVENOUS | Status: DC

## 2018-01-22 MED ORDER — SODIUM CHLORIDE 0.9 % IV SOLN
INTRAVENOUS | Status: DC
  Administered 2018-01-22: 10:00:00 via INTRAVENOUS

## 2018-01-22 MED ORDER — LIDOCAINE HCL (PF) 1 % IJ SOLN
INTRAMUSCULAR | Status: AC
  Filled 2018-01-22: qty 30

## 2018-01-22 MED ORDER — ONDANSETRON HCL 4 MG/2ML IJ SOLN: 4.0000 mg | Freq: Four times a day (QID) | INTRAMUSCULAR | Status: DC | PRN

## 2018-01-22 MED ORDER — ACETAMINOPHEN 325 MG PO TABS: 650.0000 mg | ORAL_TABLET | ORAL | Status: DC | PRN

## 2018-01-22 SURGICAL SUPPLY — 9 items
CATH BALLN WEDGE 5F 110CM (CATHETERS) ×1 IMPLANT
GUIDEWIRE .025 260CM (WIRE) ×1 IMPLANT
PACK CARDIAC CATHETERIZATION (CUSTOM PROCEDURE TRAY) ×2 IMPLANT
PROTECTION STATION PRESSURIZED (MISCELLANEOUS) ×2
SHEATH RAIN 4/5FR (SHEATH) ×1 IMPLANT
STATION PROTECTION PRESSURIZED (MISCELLANEOUS) IMPLANT
TRANSDUCER W/STOPCOCK (MISCELLANEOUS) ×2 IMPLANT
TUBING ART PRESS 72  MALE/FEM (TUBING) ×1
TUBING ART PRESS 72 MALE/FEM (TUBING) IMPLANT

## 2018-01-22 NOTE — Discharge Instructions (Signed)
Brachial Site Care Refer to this sheet in the next few weeks. These instructions provide you with information about caring for yourself after your procedure. Your health care provider may also give you more specific instructions. Your treatment has been planned according to current medical practices, but problems sometimes occur. Call your health care provider if you have any problems or questions after your procedure. What can I expect after the procedure? After your procedure, it is typical to have the following:  Bruising at the radial site that usually fades within 1-2 weeks.  Blood collecting in the tissue (hematoma) that may be painful to the touch. It should usually decrease in size and tenderness within 1-2 weeks.  Follow these instructions at home:  Take medicines only as directed by your health care provider.  You may shower 24-48 hours after the procedure or as directed by your health care provider. Remove the bandage (dressing) and gently wash the site with plain soap and water. Pat the area dry with a clean towel. Do not rub the site, because this may cause bleeding.  Do not take baths, swim, or use a hot tub until your health care provider approves.  Check your insertion site every day for redness, swelling, or drainage.  Do not apply powder or lotion to the site.  Do not flex or bend the affected arm for 24 hours or as directed by your health care provider.  Do not push or pull heavy objects with the affected arm for 24 hours or as directed by your health care provider.  Do not lift over 10 lb (4.5 kg) for 5 days after your procedure or as directed by your health care provider.  Ask your health care provider when it is okay to: ? Return to work or school. ? Resume usual physical activities or sports. ? Resume sexual activity.  Do not drive home if you are discharged the same day as the procedure. Have someone else drive you.  You may drive 24 hours after the procedure  unless otherwise instructed by your health care provider.  Do not operate machinery or power tools for 24 hours after the procedure.  If your procedure was done as an outpatient procedure, which means that you went home the same day as your procedure, a responsible adult should be with you for the first 24 hours after you arrive home.  Keep all follow-up visits as directed by your health care provider. This is important. Contact a health care provider if:  You have a fever.  You have chills.  You have increased bleeding from the brachial site. Hold pressure on the site. Get help right away if:  You have unusual pain at the radial site.  You have redness, warmth, or swelling at the radial site.  You have drainage (other than a small amount of blood on the dressing) from the brachiall site.  The brachial site is bleeding, and the bleeding does not stop after 30 minutes of holding steady pressure on the site.  Your arm or hand becomes pale, cool, tingly, or numb. This information is not intended to replace advice given to you by your health care provider. Make sure you discuss any questions you have with your health care provider. Document Released: 11/16/2010 Document Revised: 03/21/2016 Document Reviewed: 05/02/2014 Elsevier Interactive Patient Education  2018 [employer].

## 2018-01-22 NOTE — Interval H&P Note (Signed)
History and Physical Interval Note:  01/22/2018 10:34 AM  Lindsay Harmon  has presented today for surgery, with the diagnosis of hp  The various methods of treatment have been discussed with the patient and family. After consideration of risks, benefits and other options for treatment, the patient has consented to  Procedure(s): RIGHT HEART CATH (N/A) as a surgical intervention .  The patient's history has been reviewed, patient examined, no change in status, stable for surgery.  I have reviewed the patient's chart and labs.  Questions were answered to the patient's satisfaction.     Yoltzin Barg

## 2018-01-23 MED FILL — Heparin Sodium (Porcine) 2 Unit/ML in Sodium Chloride 0.9%: INTRAMUSCULAR | Qty: 1000 | Status: AC

## 2018-02-04 DIAGNOSIS — I509 Heart failure, unspecified: Secondary | ICD-10-CM | POA: Diagnosis not present

## 2018-02-04 DIAGNOSIS — J449 Chronic obstructive pulmonary disease, unspecified: Secondary | ICD-10-CM | POA: Diagnosis not present

## 2018-02-19 ENCOUNTER — Other Ambulatory Visit: Payer: Self-pay

## 2018-02-19 ENCOUNTER — Encounter (HOSPITAL_COMMUNITY): Payer: Self-pay | Admitting: Internal Medicine

## 2018-02-19 ENCOUNTER — Ambulatory Visit (HOSPITAL_COMMUNITY)
Admission: RE | Admit: 2018-02-19 | Discharge: 2018-02-19 | Disposition: A | Discharge status: Home / Self Care | Payer: PPO | Source: Ambulatory Visit | Attending: Internal Medicine | Admitting: Internal Medicine

## 2018-02-19 VITALS — BP 116/72 | HR 90 | Wt 166.8 lb

## 2018-02-19 DIAGNOSIS — E1149 Type 2 diabetes mellitus with other diabetic neurological complication: Secondary | ICD-10-CM | POA: Diagnosis not present

## 2018-02-19 DIAGNOSIS — N184 Chronic kidney disease, stage 4 (severe): Secondary | ICD-10-CM | POA: Insufficient documentation

## 2018-02-19 DIAGNOSIS — J449 Chronic obstructive pulmonary disease, unspecified: Secondary | ICD-10-CM

## 2018-02-19 DIAGNOSIS — K219 Gastro-esophageal reflux disease without esophagitis: Secondary | ICD-10-CM | POA: Insufficient documentation

## 2018-02-19 DIAGNOSIS — E669 Obesity, unspecified: Secondary | ICD-10-CM | POA: Insufficient documentation

## 2018-02-19 DIAGNOSIS — K589 Irritable bowel syndrome without diarrhea: Secondary | ICD-10-CM | POA: Insufficient documentation

## 2018-02-19 DIAGNOSIS — I2510 Atherosclerotic heart disease of native coronary artery without angina pectoris: Secondary | ICD-10-CM | POA: Insufficient documentation

## 2018-02-19 DIAGNOSIS — E785 Hyperlipidemia, unspecified: Secondary | ICD-10-CM | POA: Insufficient documentation

## 2018-02-19 DIAGNOSIS — I700 Atherosclerosis of aorta: Secondary | ICD-10-CM | POA: Diagnosis not present

## 2018-02-19 DIAGNOSIS — Z7982 Long term (current) use of aspirin: Secondary | ICD-10-CM | POA: Diagnosis not present

## 2018-02-19 DIAGNOSIS — I5032 Chronic diastolic (congestive) heart failure: Secondary | ICD-10-CM | POA: Diagnosis not present

## 2018-02-19 DIAGNOSIS — J984 Other disorders of lung: Secondary | ICD-10-CM | POA: Diagnosis not present

## 2018-02-19 DIAGNOSIS — I2781 Cor pulmonale (chronic): Secondary | ICD-10-CM | POA: Diagnosis not present

## 2018-02-19 DIAGNOSIS — R0902 Hypoxemia: Secondary | ICD-10-CM | POA: Diagnosis not present

## 2018-02-19 DIAGNOSIS — Z87891 Personal history of nicotine dependence: Secondary | ICD-10-CM | POA: Insufficient documentation

## 2018-02-19 DIAGNOSIS — J9610 Chronic respiratory failure, unspecified whether with hypoxia or hypercapnia: Secondary | ICD-10-CM | POA: Diagnosis not present

## 2018-02-19 DIAGNOSIS — E1151 Type 2 diabetes mellitus with diabetic peripheral angiopathy without gangrene: Secondary | ICD-10-CM | POA: Diagnosis not present

## 2018-02-19 DIAGNOSIS — Z794 Long term (current) use of insulin: Secondary | ICD-10-CM | POA: Insufficient documentation

## 2018-02-19 DIAGNOSIS — E871 Hypo-osmolality and hyponatremia: Secondary | ICD-10-CM | POA: Insufficient documentation

## 2018-02-19 DIAGNOSIS — I130 Hypertensive heart and chronic kidney disease with heart failure and stage 1 through stage 4 chronic kidney disease, or unspecified chronic kidney disease: Secondary | ICD-10-CM | POA: Insufficient documentation

## 2018-02-19 DIAGNOSIS — Z79899 Other long term (current) drug therapy: Secondary | ICD-10-CM | POA: Diagnosis not present

## 2018-02-19 DIAGNOSIS — I272 Pulmonary hypertension, unspecified: Secondary | ICD-10-CM | POA: Diagnosis not present

## 2018-02-19 DIAGNOSIS — E1165 Type 2 diabetes mellitus with hyperglycemia: Secondary | ICD-10-CM

## 2018-02-19 DIAGNOSIS — IMO0002 Reserved for concepts with insufficient information to code with codable children: Secondary | ICD-10-CM

## 2018-02-19 DIAGNOSIS — F329 Major depressive disorder, single episode, unspecified: Secondary | ICD-10-CM | POA: Diagnosis not present

## 2018-02-19 DIAGNOSIS — E1122 Type 2 diabetes mellitus with diabetic chronic kidney disease: Secondary | ICD-10-CM | POA: Diagnosis not present

## 2018-02-19 MED ORDER — SILDENAFIL CITRATE 20 MG PO TABS: 20.0000 mg | ORAL_TABLET | Freq: Three times a day (TID) | ORAL | 3 refills | Status: DC

## 2018-02-19 NOTE — Progress Notes (Signed)
Patient ID: Lindsay Harmon, female   DOB: 07-01-1956, 62 y.o.   MRN: [id number]    Advanced Heart Failure Clinic Note   PCP: Dr Silvio Pate  Primary HF  Cardiologist: Dr Haroldine Laws Pulmonary: [name]   HPI: Lindsay Harmon is a 62 y.o. female with history of HTN, DM, IBS, depression, GERD, severe COPD (FEV1 0.87L)  and pulmonary hypertension with RV failure/cor pulmonale, and chronic diastolic heart failure.   Admitted in 8/16 with volume overload and severe hyponatremia. Diuresed with IV lasix and transtioned back torsemide and metolazone as needed. Discharge weight 153 pounds.   Admitted to [facility] in 12/16 with volume depletion and hyponatremia with Na 113. Hydrated. Has since followed up with Dr. Anthonette Legato in Renal. Now on torsemide 20 bid and occasional metolazone.   She presents today for post RHC follow up as below. Cath showed severe PAH with normal left-sided pressures and cardiac output. Breathing is stable per patient. She is taking all medications as directed. She is not SOB changing clothes or bathing but gets dyspneic when she does more. She sleeps in recliner chronically due to LBP No orthopnea or PND. Feels edema is well controlled. No syncope or presyncope. Weight at home stable 160-165s.   [place] 01/22/18 Findings: RA = 11 RV = 92/12 PA = 92/33 (57) PCW = 15 Fick cardiac output/index = 5.9/3.3 PVR = 7.2 WU Ao sat = 93% PA sat = 62%, 64%   RHC 12/05/2014 RA 12 mmHg (mean) with O2 sat 72%; RV 97/16 mmHg with O2 sat 73%; PA 97/30 mmHg with O2 sat 67%; PCWP(mean) 14 mmHg (mean); Cardiac Output 5.78 L/min  PFTs  11/2014 with severe COPD  FEV1 0.87 (38%) FVC 1.41 (48%) FEF 25-75 0.41 (19%) Unable to do DLCO  March 2019  Simple spirometry ratio 67% FEV1 1.21 L+ 53% predicted FVC 1.79 L 61% predicted   Echo  11/2014 EF 60-65%, aortic sclerosis, RV moderately dilated/moderately decreased systolic function, PASP 71 mmHg  09/30/2017 LVEF 55-60%, Grade 1 DD, Mild/Mod AS  Mean gradient 16 mmHg, Peak PA pressure 115 Hg  Review of systems complete and found to be negative unless listed in HPI.    SH:  Social History   Socioeconomic History  . Marital status: Legally Separated    Spouse name: Not on file  . Number of children: 1  . Years of education: Not on file  . Highest education level: Not on file  Occupational History  . 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 Financial resource strain: Not on file  . Food insecurity:    Worry: Not on file    Inability: Not on file  . Transportation needs:    Medical: Not on file    Non-medical: Not on file  Tobacco Use  . Smoking status: Former Smoker    Packs/day: 1.50    Years: 20.00    Pack years: 30.00    Types: Cigarettes    Last attempt to quit: 01/04/2009    Years since quitting: 9.1  . Smokeless tobacco: Never Used  Substance and Sexual Activity  . Alcohol use: No    Alcohol/week: 0.0 oz    Comment: heavy in the past  . Drug use: No  . Sexual activity: Never  Lifestyle  . Physical activity:    Days per week: Not on file    Minutes per session: Not on file  . Stress: Not on file  Relationships  . Social connections:    Talks on phone: Not on file    Gets together: Not on file    Attends religious service: Not on file    Active member of club or organization: Not on file    Attends meetings of clubs or organizations: Not on file    Relationship status: Not on file  . Intimate partner violence:    Fear of current or ex partner: Not on file    Emotionally abused: Not on file    Physically abused: Not on file    Forced sexual activity: Not on file  Other Topics Concern  . Not on file  Social History Narrative   No living will   Son [name] (then mom) should make decisions for her if she is unable.    Would accept resuscitation attempts but no prolonged ventilation   Not sure about tube feeds but probably wouldn't want them if  cognitively unaware   FH:  Family History  Problem Relation Age of Onset  . Diabetes Mother   . Cancer Mother        ovarian,melanoma  . Allergies Father   . Cancer Father        lung  . Cancer Maternal Grandmother        uterine  . Emphysema Paternal Grandfather   . Allergies Sister   . Breast cancer Neg Hx     Past Medical History:  Diagnosis Date  . Allergy   . Arthritis   . CAD (coronary artery disease)   . COPD (chronic obstructive pulmonary disease) ([place])   . Depression   . Esophageal stricture   . Familial hematuria   . GERD (gastroesophageal reflux disease)   . Hyperlipidemia   . Hypertension   . IBS (irritable bowel syndrome)   . Nephrolithiasis   . NIDDM (non-insulin dependent diabetes mellitus)    with neuropathy  . Obesity, unspecified   . Pulmonary hypertension ([place])   . PVD (peripheral vascular disease) ([place])    Current Outpatient Medications  Medication Sig Dispense Refill  . aspirin 325 MG tablet Take 325 mg by mouth at bedtime.     . calcitRIOL (ROCALTROL) 0.5 MCG capsule Take 0.5 mcg by mouth daily.     . cholecalciferol (VITAMIN D) 1000 units tablet Take 1,000 Units by mouth at bedtime.     . enalapril (VASOTEC) 5 MG tablet Take 1 tablet (5 mg total) by mouth daily. 30 tablet 0  . gabapentin (NEURONTIN) 300 MG capsule Take 1 capsule by mouth in the morning, 1 capsule at noon, and 2 capsules at bedtime 360 capsule 2  . glipiZIDE (GLUCOTROL) 5 MG tablet Take 1 tablet (5 mg total) by mouth 2 (two) times daily before a meal. 180 tablet 1  . Insulin Glargine (LANTUS) 100 UNIT/ML Solostar Pen Inject 32 Units into the skin daily. (Patient taking differently: Inject 30 Units into the skin daily. ) 15 mL 11  . Insulin Pen Needle (SURE COMFORT PEN NEEDLES) 31G X 5 MM MISC USE 1 PEN NEEDLE TO INJECT INSULIN 90 each 3  . loratadine (CLARITIN) 10 MG tablet Take 10 mg by mouth 2 (two) times daily.     Marland Kitchen lovastatin (MEVACOR) 40 MG tablet Take 2 tablets (80 mg  total) by mouth at bedtime. 180 tablet 3  . methadone (DOLOPHINE) 10 MG tablet Take 2 tablets (20 mg total) by mouth 4 (four) times daily. 240 tablet 0  . metoprolol tartrate (LOPRESSOR) 25 MG  tablet Take 0.5 tablets (12.5 mg total) by mouth 2 (two) times daily. 90 tablet 2  . mirtazapine (REMERON) 30 MG tablet Take 15 mg by mouth at bedtime.    . nitroGLYCERIN (NITROSTAT) 0.4 MG SL tablet Place 1 tablet (0.4 mg total) under the tongue every 5 (five) minutes as needed for chest pain. 100 tablet 1  . ONE TOUCH ULTRA Harmon Harmon strip USE 1 STRIP TO Harmon BLOOD SUGAR ONCE DAILY 100 each 2  . pantoprazole (PROTONIX) 40 MG tablet Take 1 tablet by mouth twice a day 180 tablet 3  . STIOLTO RESPIMAT 2.5-2.5 MCG/ACT AERS Inhale 2 puffs into the lungs daily. 4 g 5  . torsemide (DEMADEX) 20 MG tablet Take 2 tablets (40 mg total) by mouth daily. 180 tablet 2  . VENTOLIN HFA 108 (90 Base) MCG/ACT inhaler USE 2 PUFFS EVERY SIX HOURS AS NEEDED FOR WHEEZING OR SHORTNESS OF BREATH 18 g 3   No current facility-administered medications for this encounter.    Vitals:   02/19/18 1027  BP: 116/72  Pulse: 90  SpO2: 94%  Weight: 166 lb 12 oz (75.6 kg)   Wt Readings from Last 3 Encounters:  02/19/18 166 lb 12 oz (75.6 kg)  01/22/18 168 lb (76.2 kg)  01/21/18 167 lb (75.8 kg)    PHYSICAL EXAM: General:  Appears chronically ill. No resp difficulty. Wearing O2 HEENT: normal anicteric  Neck: supple. JVP 6-7. Carotids 2+ bilat; no bruits. No lymphadenopathy or thryomegaly appreciated. Cor: PMI nondisplaced. Regular rate & rhythm. No rubs, gallops. 3/6 systolic murmur at LUSB  Lungs: clear on 5 liters oxygen. Mildly decreased BS throughout  Abdomen: soft, nontender, nondistended. No hepatosplenomegaly. No bruits or masses. Good bowel sounds. Extremities: no cyanosis, clubbing, rash, edema Neuro: alert & oriented x 3, cranial nerves grossly intact. moves all 4 extremities w/o difficulty. Affect  pleasant   ASSESSMENT & PLAN: 1. Chronic Diastolic HF - Echo 78/11/4233 LVEF 55-60%, Grade 1 DD, Mild/Mod AS Mean gradient 16 mmHg, Peak PA pressure 115 Hg - Volume status stable.  - Continue torsemide 40 mg daily. Can take extra as needed.  - Reinforced fluid restriction to < 2 L daily, sodium restriction to less than 2000 mg daily, and the importance of daily weights.   - Renal following for CKD IV.  -2. PAH with cor pulmonale  - likely Who Group 3. Not candidate for selective pulmonary vasodilators - Primarily WHO Group 3 with chronic hypoxic lung disease, but PA pressures out of proportion to her reduction in FEV1. Suspect component of FEV1. - Will carefully trial sildenafil. Will have to watch closely for shunting. - As above with elevated Peak PA pressure on Echo.  - Peak PA pressure 92 on RHC 01/22/18, has been chronically high.  3. Severe COPD - 5 liters with exertion and 4 liters at home.  - FVC 1.79 (61%), FEV1 1.21 (53%)  4. Chronic respiratory failure - Sats stable on 4L at rest, 5L with activity.  5. Hyponatremia - 133 01/20/18. 6. CKD IV - Creatinine 2.67 01/20/18. - Renal following.  7. Mild/Mod AS - Mean gradient 16 mmHg - Could consider TEE to quantify further if needed, but would avoid procedures when possible with her lung disease.  - No change to current plan.    Will carefully trial on sildenafil 20 mg TID. RTC 2 months  Shirley Friar, PA-C  10:25 AM   Patient seen and examined with the above-signed Advanced Practice Provider and/or Housestaff. I  personally reviewed laboratory data, imaging studies and relevant notes. I independently examined the patient and formulated the important aspects of the plan. I have edited the note to reflect any of my changes or salient points. I have personally discussed the plan with the patient and/or family.  [place] numbers reviewed with her in detail. Degree of PAH seems well out of proportion to COPD and spirometry. Will  proceed with careful trial of sildenafil. She has also discussed this with Dr. Lake Bells who agrees. Volume status looks good on exam and on RHC. Continue current diuretics. Recheck labs.   Glori Bickers, MD  9:57 PM

## 2018-02-19 NOTE — Patient Instructions (Signed)
Start Sildenafil 20 mg Three times a day   Your physician recommends that you schedule a follow-up appointment in: 2 months

## 2018-02-20 ENCOUNTER — Telehealth (HOSPITAL_COMMUNITY): Payer: Self-pay | Admitting: *Deleted

## 2018-02-20 NOTE — Telephone Encounter (Signed)
Completed PA for pt's Sildenafil, medication approved from 02/19/18 through 10/27/18.  Called pharmacy and left them a message this was approved.

## 2018-02-26 ENCOUNTER — Other Ambulatory Visit: Payer: Self-pay | Admitting: Internal Medicine

## 2018-02-26 MED ORDER — METHADONE HCL 10 MG PO TABS: 20.0000 mg | ORAL_TABLET | Freq: Four times a day (QID) | ORAL | 0 refills | Status: DC

## 2018-02-26 NOTE — Telephone Encounter (Signed)
[facility]ing [facility]ill [facility]one [facility] Last refilled # 240 on 01/21/18 Last seen 01/21/18.

## 2018-02-26 NOTE — Telephone Encounter (Signed)
Refill for Methadone  10 mg tablet for #240 tabs LR  01/21/18 Dr. Silvio Pate LOV 01/21/18 NOV 06/28 Pharmacy  [facility]

## 2018-02-26 NOTE — Telephone Encounter (Signed)
Copied from [place] [id number]. Topic: Quick Communication - Rx Refill/Question >> Feb 26, 2018 11:46 AM Aurelio Brash B wrote: Medication:  methadone (DOLOPHINE) 10 MG tablet    Has the patient contacted their pharmacy? No   (Agent: If no, request that the patient contact the pharmacy for the refill.)  Preferred Pharmacy (with phone number or street name): [place], [place] - [address] [phone] (Phone) [phone] (Fax)     Agent: Please be advised that RX refills may take up to 3 business days. We ask that you follow-up with your pharmacy.

## 2018-02-27 DIAGNOSIS — N2581 Secondary hyperparathyroidism of renal origin: Secondary | ICD-10-CM | POA: Diagnosis not present

## 2018-02-27 DIAGNOSIS — R600 Localized edema: Secondary | ICD-10-CM | POA: Diagnosis not present

## 2018-02-27 DIAGNOSIS — N184 Chronic kidney disease, stage 4 (severe): Secondary | ICD-10-CM | POA: Diagnosis not present

## 2018-02-27 DIAGNOSIS — E871 Hypo-osmolality and hyponatremia: Secondary | ICD-10-CM | POA: Diagnosis not present

## 2018-02-27 DIAGNOSIS — D631 Anemia in chronic kidney disease: Secondary | ICD-10-CM | POA: Diagnosis not present

## 2018-03-04 ENCOUNTER — Ambulatory Visit: Payer: PPO | Admitting: Pulmonary Disease

## 2018-03-06 DIAGNOSIS — I509 Heart failure, unspecified: Secondary | ICD-10-CM | POA: Diagnosis not present

## 2018-03-06 DIAGNOSIS — J449 Chronic obstructive pulmonary disease, unspecified: Secondary | ICD-10-CM | POA: Diagnosis not present

## 2018-03-09 ENCOUNTER — Other Ambulatory Visit: Payer: Self-pay | Admitting: Internal Medicine

## 2018-03-11 ENCOUNTER — Ambulatory Visit: Payer: PPO | Admitting: Pulmonary Disease

## 2018-03-11 ENCOUNTER — Encounter: Payer: Self-pay | Admitting: Pulmonary Disease

## 2018-03-11 VITALS — BP 110/68 | HR 102 | Ht 61.0 in | Wt 164.0 lb

## 2018-03-11 DIAGNOSIS — R0600 Dyspnea, unspecified: Secondary | ICD-10-CM

## 2018-03-11 DIAGNOSIS — J9611 Chronic respiratory failure with hypoxia: Secondary | ICD-10-CM

## 2018-03-11 DIAGNOSIS — J449 Chronic obstructive pulmonary disease, unspecified: Secondary | ICD-10-CM

## 2018-03-11 DIAGNOSIS — I2781 Cor pulmonale (chronic): Secondary | ICD-10-CM

## 2018-03-11 NOTE — Progress Notes (Signed)
Subjective:    Patient ID: Lindsay Harmon, female    DOB: [birth year], 62 y.o.   MRN: [id number]   Synopsis: Referred by cardiology in 2016 for evaluation of severe COPD and possible obstructive sleep apnea in the setting of pulmonary hypertension. She also has congestive heart failure. A sleep study in 2016 showed no evidence of obstructive sleep apnea but she does have significant nocturnal hypoxemia. Spirometry testing in 2016 confirmed a diagnosis of COPD with severe airflow obstruction FEV1 41% pred.  She smoked cigarettes for many years and she quit smoking in 2013 after smoking 2 ppd for 40 years.   HPI Chief Complaint  Patient presents with  . Follow-up    reports she has been well since starting sildenafil.    Jermaine says she doesn't feel as heavy in her chest any more.  Doesn't feel like she gets as winded as she did before.  She says that she doesn't feel like she is going to "fall down" when walking.  No swelling or weight gain.  She has some fluctuation in her weight, but not too bad.  She is not bringing up any mucus.  She has been taking the sildenafil for two weeks now.  No cough, no mucus production.   Past Medical History:  Diagnosis Date  . Allergy   . Arthritis   . CAD (coronary artery disease)   . COPD (chronic obstructive pulmonary disease) ([place])   . Depression   . Esophageal stricture   . Familial hematuria   . GERD (gastroesophageal reflux disease)   . Hyperlipidemia   . Hypertension   . IBS (irritable bowel syndrome)   . Nephrolithiasis   . NIDDM (non-insulin dependent diabetes mellitus)    with neuropathy  . Obesity, unspecified   . Pulmonary hypertension ([place])   . PVD (peripheral vascular disease) ([place])       Review of Systems  Constitutional: Positive for fatigue. Negative for diaphoresis and fever.  HENT: Negative for postnasal drip, rhinorrhea and sinus pressure.   Respiratory: Negative for cough, shortness of breath and wheezing.     Cardiovascular: Negative for chest pain, palpitations and leg swelling.       Objective:   Physical Exam  Vitals:   03/11/18 1604  BP: 110/68  Pulse: (!) 102  SpO2: (!) 88%  Weight: 164 lb (74.4 kg)  Height: _0  (1.549 m)   3L [place] continuous: O2 saturation 93%   Gen: chronically ill appearing HENT: OP clear, TM's clear, neck supple PULM: Crackles bases B, normal percussion CV: RRR,systolic murmur noted, trace edema GI: BS+, soft, nontender Derm: no cyanosis or rash Psyche: normal mood and affect     CBC    Component Value Date/Time   WBC 7.4 01/20/2018 1431   RBC 4.60 01/20/2018 1431   HGB 9.8 (L) 01/20/2018 1431   HCT 33.8 (L) 01/20/2018 1431   PLT 261 01/20/2018 1431   MCV 73.5 (L) 01/20/2018 1431   MCH 21.3 (L) 01/20/2018 1431   MCHC 29.0 (L) 01/20/2018 1431   RDW 18.5 (H) 01/20/2018 1431   LYMPHSABS 2.3 02/26/2017 1452   MONOABS 1.0 (H) 02/26/2017 1452   EOSABS 0.3 02/26/2017 1452   BASOSABS 0.1 02/26/2017 1452   Echo: December 2018 echocardiogram showed a normal LVEF RV was mildly dilated with an RVSP greater than 100  RHC 12/05/2014 RA 12 mmHg (mean) with O2 sat 72%; RV 97/16 mmHg with O2 sat 73%; PA 97/30 mmHg with O2 sat 67%; PCWP(mean)  14 mmHg (mean); Cardiac Output 5.78 L/min Right heart catheterization March 2019: PA pressure 92/33, mean 57, pulmonary vascular resistance 7.2, cardiac output  5.9, pulmonary capillary wedge pressure 15.  Chest imaging: CT scan chest June 2018 images independently reviewed showing mild to moderate centrilobular emphysema in the upper lobes, some mosaicism in the bases. Feb 2016 V/Q IMPRESSION: No evidence of pulmonary embolism.   Pulmonary function testing: March 2019 simple spirometry ratio 67%, FEV1 1.21 L 53% predicted, FVC 1.79 L 61% predicted  Records reviewed from her recent visit with cardiology about 2 weeks ago where sildenafil was started.     Assessment & Plan:   Dyspnea, unspecified  type  Chronic obstructive pulmonary disease, unspecified COPD type ([place])  Cor pulmonale, chronic (H[facility])  Chronic respiratory failure with hypoxia ([place])  Discussion: I am pleased that Lindsay Harmon is feeling better since the addition of sildenafil.  She has some degree of pulmonary arteriopathy out of proportion to her severe COPD.  I think while we will need to continue to titrate up the sildenafil the next best approach will be to add Tyvaso.  Hopefully with her severe lung disease she will do well with this.  Plan: COPD, severe: Continue taking Stiolto 2 puffs twice a day Use albuterol as needed for chest tightness wheezing or shortness of breath  Pulmonary hypertension: Continue taking sildenafil as directed by the advanced heart failure clinic Start taking Tyvaso 3 puffs 4 times a day, will escalate this medicine when she was started  Chronic respiratory failure with hypoxemia: Continue 4 L at rest, 5 L with exertion  Follow-up two months  Current Outpatient Medications:  .  aspirin 325 MG tablet, Take 325 mg by mouth at bedtime. , Disp: , Rfl:  .  calcitRIOL (ROCALTROL) 0.5 MCG capsule, Take 0.5 mcg by mouth daily. , Disp: , Rfl:  .  cholecalciferol (VITAMIN D) 1000 units tablet, Take 1,000 Units by mouth at bedtime. , Disp: , Rfl:  .  enalapril (VASOTEC) 5 MG tablet, Take 1 tablet (5 mg total) by mouth daily., Disp: 30 tablet, Rfl: 0 .  gabapentin (NEURONTIN) 300 MG capsule, Take 1 capsule by mouth in the morning, 1 capsule at noon, and 2 capsules at bedtime, Disp: 360 capsule, Rfl: 2 .  glipiZIDE (GLUCOTROL) 5 MG tablet, Take 1 tablet by mouth twice a day before a meal, Disp: 180 tablet, Rfl: 3 .  Insulin Glargine (LANTUS) 100 UNIT/ML Solostar Pen, Inject 32 Units into the skin daily. (Patient taking differently: Inject 30 Units into the skin daily. ), Disp: 15 mL, Rfl: 11 .  Insulin Pen Needle (SURE COMFORT PEN NEEDLES) 31G X 5 MM MISC, USE 1 PEN NEEDLE TO INJECT INSULIN, Disp: 90  each, Rfl: 3 .  loratadine (CLARITIN) 10 MG tablet, Take 10 mg by mouth 2 (two) times daily. , Disp: , Rfl:  .  lovastatin (MEVACOR) 40 MG tablet, Take 2 tablets (80 mg total) by mouth at bedtime., Disp: 180 tablet, Rfl: 3 .  methadone (DOLOPHINE) 10 MG tablet, Take 2 tablets (20 mg total) by mouth 4 (four) times daily., Disp: 240 tablet, Rfl: 0 .  metoprolol tartrate (LOPRESSOR) 25 MG tablet, Take 0.5 tablets (12.5 mg total) by mouth 2 (two) times daily., Disp: 90 tablet, Rfl: 2 .  mirtazapine (REMERON) 30 MG tablet, Take 15 mg by mouth at bedtime., Disp: , Rfl:  .  nitroGLYCERIN (NITROSTAT) 0.4 MG SL tablet, Place 1 tablet (0.4 mg total) under the tongue every 5 (five)  minutes as needed for chest pain., Disp: 100 tablet, Rfl: 1 .  ONE TOUCH ULTRA TEST test strip, USE 1 STRIP TO TEST BLOOD SUGAR ONCE DAILY, Disp: 100 each, Rfl: 2 .  pantoprazole (PROTONIX) 40 MG tablet, Take 1 tablet by mouth twice a day, Disp: 180 tablet, Rfl: 3 .  sildenafil (REVATIO) 20 MG tablet, Take 1 tablet (20 mg total) by mouth 3 (three) times daily., Disp: 90 tablet, Rfl: 3 .  STIOLTO RESPIMAT 2.5-2.5 MCG/ACT AERS, Inhale 2 puffs into the lungs daily., Disp: 4 g, Rfl: 5 .  torsemide (DEMADEX) 20 MG tablet, Take 2 tablets (40 mg total) by mouth daily., Disp: 180 tablet, Rfl: 2 .  VENTOLIN HFA 108 (90 Base) MCG/ACT inhaler, USE 2 PUFFS EVERY SIX HOURS AS NEEDED FOR WHEEZING OR SHORTNESS OF BREATH, Disp: 18 g, Rfl: 3

## 2018-03-11 NOTE — Patient Instructions (Signed)
COPD, severe: Continue taking Stiolto 2 puffs twice a day Use albuterol as needed for chest tightness wheezing or shortness of breath  Pulmonary hypertension: Continue taking sildenafil as directed by the advanced heart failure clinic Start taking Tyvaso 3 puffs 4 times a day, will escalate this medicine when she was started  Chronic respiratory failure with hypoxemia: Continue 4 L at rest, 5 L with exertion  Follow-up two months

## 2018-04-02 ENCOUNTER — Ambulatory Visit: Payer: PPO | Admitting: Internal Medicine

## 2018-04-06 DIAGNOSIS — J449 Chronic obstructive pulmonary disease, unspecified: Secondary | ICD-10-CM | POA: Diagnosis not present

## 2018-04-06 DIAGNOSIS — I509 Heart failure, unspecified: Secondary | ICD-10-CM | POA: Diagnosis not present

## 2018-04-08 ENCOUNTER — Telehealth: Payer: Self-pay | Admitting: *Deleted

## 2018-04-08 DIAGNOSIS — Z87891 Personal history of nicotine dependence: Secondary | ICD-10-CM

## 2018-04-08 DIAGNOSIS — Z122 Encounter for screening for malignant neoplasm of respiratory organs: Secondary | ICD-10-CM

## 2018-04-08 NOTE — Telephone Encounter (Signed)
Notified patient that annual lung cancer screening low dose CT scan is due currently or will be in near future. Confirmed that patient is within the age range of 55-77, and asymptomatic, (no signs or symptoms of lung cancer). Patient denies illness that would prevent curative treatment for lung cancer if found. Verified smoking history, (former, 30 pack year). The shared decision making visit was done 04/09/17. Patient is agreeable for CT scan being scheduled.

## 2018-04-08 NOTE — Telephone Encounter (Signed)
Pt notified and appt details mailed. Pt was confused as to the location, but told her it was beside [place] and the appt card would have the address also.

## 2018-04-13 ENCOUNTER — Ambulatory Visit
Admission: RE | Admit: 2018-04-13 | Discharge: 2018-04-13 | Disposition: A | Discharge status: Home / Self Care | Payer: PPO | Source: Ambulatory Visit | Attending: Nurse Practitioner | Admitting: Nurse Practitioner

## 2018-04-13 ENCOUNTER — Encounter: Payer: Self-pay | Admitting: Internal Medicine

## 2018-04-13 DIAGNOSIS — I2510 Atherosclerotic heart disease of native coronary artery without angina pectoris: Secondary | ICD-10-CM | POA: Insufficient documentation

## 2018-04-13 DIAGNOSIS — I289 Disease of pulmonary vessels, unspecified: Secondary | ICD-10-CM | POA: Insufficient documentation

## 2018-04-13 DIAGNOSIS — J439 Emphysema, unspecified: Secondary | ICD-10-CM | POA: Insufficient documentation

## 2018-04-13 DIAGNOSIS — Z87891 Personal history of nicotine dependence: Secondary | ICD-10-CM | POA: Diagnosis not present

## 2018-04-13 DIAGNOSIS — I700 Atherosclerosis of aorta: Secondary | ICD-10-CM | POA: Insufficient documentation

## 2018-04-13 DIAGNOSIS — Z122 Encounter for screening for malignant neoplasm of respiratory organs: Secondary | ICD-10-CM | POA: Insufficient documentation

## 2018-04-16 ENCOUNTER — Other Ambulatory Visit: Payer: Self-pay | Admitting: Internal Medicine

## 2018-04-16 ENCOUNTER — Telehealth: Payer: Self-pay

## 2018-04-16 NOTE — Telephone Encounter (Signed)
Copied from [place] [id number]. Topic: Quick Communication - Rx Refill/Question >> Apr 16, 2018  2:47 PM Bea Graff, NT wrote: Medication: methadone (DOLOPHINE) 10 MG tablet   Has the patient contacted their pharmacy? Yes.   (Agent: If no, request that the patient contact the pharmacy for the refill.) (Agent: If yes, when and what did the pharmacy advise?)  Preferred Pharmacy (with phone number or street name): [place], [place] - [address] [phone] (Phone) [phone] (Fax)      Agent: Please be advised that RX refills may take up to 3 business days. We ask that you follow-up with your pharmacy.

## 2018-04-16 NOTE — Telephone Encounter (Signed)
I do not see that anyone here at [facility] has called her. There are no results for her. Left message on vm per DPR

## 2018-04-16 NOTE — Telephone Encounter (Signed)
Copied from [place] [id number]. Topic: General - Other >> Apr 16, 2018  3:31 PM Boyd Kerbs wrote: Reason for CRM:  pt. Said someone called her maybe about results. Please call back

## 2018-04-16 NOTE — Telephone Encounter (Signed)
Methadone refill Last Refill:02/26/18 #240 Last OV: 01/21/18 PCP: Dr. Silvio Pate [facility]

## 2018-04-17 ENCOUNTER — Telehealth: Payer: Self-pay | Admitting: Pulmonary Disease

## 2018-04-17 MED ORDER — METHADONE HCL 10 MG PO TABS: 20.0000 mg | ORAL_TABLET | Freq: Four times a day (QID) | ORAL | 0 refills | Status: DC

## 2018-04-17 NOTE — Telephone Encounter (Signed)
UDS/CSA 03-21-17 Next OV 04-24-18

## 2018-04-17 NOTE — Telephone Encounter (Signed)
Called and spoke with Lindsay Harmon from [facility] stating to her once pt is able to receive the med it will be fine for her to start.  Lindsay Harmon expressed understanding. Nothing further needed.

## 2018-04-19 ENCOUNTER — Inpatient Hospital Stay
Admission: EM | Admit: 2018-04-19 | Discharge: 2018-04-24 | DRG: 314 | Disposition: A | Discharge status: Home Health | Payer: PPO | Attending: Internal Medicine | Admitting: Internal Medicine

## 2018-04-19 ENCOUNTER — Encounter: Payer: Self-pay | Admitting: Emergency Medicine

## 2018-04-19 ENCOUNTER — Other Ambulatory Visit: Payer: Self-pay

## 2018-04-19 ENCOUNTER — Emergency Department: Payer: PPO

## 2018-04-19 DIAGNOSIS — D72829 Elevated white blood cell count, unspecified: Secondary | ICD-10-CM | POA: Diagnosis present

## 2018-04-19 DIAGNOSIS — I2510 Atherosclerotic heart disease of native coronary artery without angina pectoris: Secondary | ICD-10-CM | POA: Diagnosis present

## 2018-04-19 DIAGNOSIS — I2721 Secondary pulmonary arterial hypertension: Secondary | ICD-10-CM | POA: Diagnosis not present

## 2018-04-19 DIAGNOSIS — E1140 Type 2 diabetes mellitus with diabetic neuropathy, unspecified: Secondary | ICD-10-CM | POA: Diagnosis not present

## 2018-04-19 DIAGNOSIS — E1122 Type 2 diabetes mellitus with diabetic chronic kidney disease: Secondary | ICD-10-CM | POA: Diagnosis present

## 2018-04-19 DIAGNOSIS — B179 Acute viral hepatitis, unspecified: Secondary | ICD-10-CM | POA: Diagnosis present

## 2018-04-19 DIAGNOSIS — I071 Rheumatic tricuspid insufficiency: Secondary | ICD-10-CM | POA: Diagnosis not present

## 2018-04-19 DIAGNOSIS — D509 Iron deficiency anemia, unspecified: Secondary | ICD-10-CM | POA: Diagnosis present

## 2018-04-19 DIAGNOSIS — I130 Hypertensive heart and chronic kidney disease with heart failure and stage 1 through stage 4 chronic kidney disease, or unspecified chronic kidney disease: Secondary | ICD-10-CM | POA: Diagnosis not present

## 2018-04-19 DIAGNOSIS — R740 Nonspecific elevation of levels of transaminase and lactic acid dehydrogenase [LDH]: Secondary | ICD-10-CM | POA: Diagnosis present

## 2018-04-19 DIAGNOSIS — I700 Atherosclerosis of aorta: Secondary | ICD-10-CM | POA: Diagnosis present

## 2018-04-19 DIAGNOSIS — J9621 Acute and chronic respiratory failure with hypoxia: Secondary | ICD-10-CM

## 2018-04-19 DIAGNOSIS — R079 Chest pain, unspecified: Secondary | ICD-10-CM | POA: Diagnosis not present

## 2018-04-19 DIAGNOSIS — Z9981 Dependence on supplemental oxygen: Secondary | ICD-10-CM

## 2018-04-19 DIAGNOSIS — Z66 Do not resuscitate: Secondary | ICD-10-CM | POA: Diagnosis not present

## 2018-04-19 DIAGNOSIS — E872 Acidosis: Secondary | ICD-10-CM | POA: Diagnosis not present

## 2018-04-19 DIAGNOSIS — E860 Dehydration: Secondary | ICD-10-CM | POA: Diagnosis present

## 2018-04-19 DIAGNOSIS — R000 Tachycardia, unspecified: Secondary | ICD-10-CM | POA: Diagnosis present

## 2018-04-19 DIAGNOSIS — R9431 Abnormal electrocardiogram [ECG] [EKG]: Secondary | ICD-10-CM | POA: Diagnosis present

## 2018-04-19 DIAGNOSIS — K7200 Acute and subacute hepatic failure without coma: Secondary | ICD-10-CM | POA: Diagnosis present

## 2018-04-19 DIAGNOSIS — I5082 Biventricular heart failure: Secondary | ICD-10-CM | POA: Diagnosis not present

## 2018-04-19 DIAGNOSIS — E785 Hyperlipidemia, unspecified: Secondary | ICD-10-CM | POA: Diagnosis present

## 2018-04-19 DIAGNOSIS — M6281 Muscle weakness (generalized): Secondary | ICD-10-CM | POA: Diagnosis not present

## 2018-04-19 DIAGNOSIS — R748 Abnormal levels of other serum enzymes: Secondary | ICD-10-CM | POA: Diagnosis not present

## 2018-04-19 DIAGNOSIS — Z7189 Other specified counseling: Secondary | ICD-10-CM

## 2018-04-19 DIAGNOSIS — R778 Other specified abnormalities of plasma proteins: Secondary | ICD-10-CM

## 2018-04-19 DIAGNOSIS — N3289 Other specified disorders of bladder: Secondary | ICD-10-CM | POA: Diagnosis not present

## 2018-04-19 DIAGNOSIS — I248 Other forms of acute ischemic heart disease: Secondary | ICD-10-CM | POA: Diagnosis not present

## 2018-04-19 DIAGNOSIS — I959 Hypotension, unspecified: Secondary | ICD-10-CM | POA: Diagnosis present

## 2018-04-19 DIAGNOSIS — R5381 Other malaise: Secondary | ICD-10-CM | POA: Diagnosis not present

## 2018-04-19 DIAGNOSIS — R7989 Other specified abnormal findings of blood chemistry: Secondary | ICD-10-CM

## 2018-04-19 DIAGNOSIS — G4733 Obstructive sleep apnea (adult) (pediatric): Secondary | ICD-10-CM | POA: Diagnosis not present

## 2018-04-19 DIAGNOSIS — R0902 Hypoxemia: Secondary | ICD-10-CM

## 2018-04-19 DIAGNOSIS — Z7982 Long term (current) use of aspirin: Secondary | ICD-10-CM

## 2018-04-19 DIAGNOSIS — Z515 Encounter for palliative care: Secondary | ICD-10-CM | POA: Diagnosis not present

## 2018-04-19 DIAGNOSIS — I272 Pulmonary hypertension, unspecified: Secondary | ICD-10-CM | POA: Diagnosis not present

## 2018-04-19 DIAGNOSIS — D631 Anemia in chronic kidney disease: Secondary | ICD-10-CM | POA: Diagnosis present

## 2018-04-19 DIAGNOSIS — J449 Chronic obstructive pulmonary disease, unspecified: Secondary | ICD-10-CM | POA: Diagnosis not present

## 2018-04-19 DIAGNOSIS — D689 Coagulation defect, unspecified: Secondary | ICD-10-CM | POA: Diagnosis present

## 2018-04-19 DIAGNOSIS — N179 Acute kidney failure, unspecified: Secondary | ICD-10-CM | POA: Diagnosis present

## 2018-04-19 DIAGNOSIS — J189 Pneumonia, unspecified organism: Secondary | ICD-10-CM | POA: Diagnosis present

## 2018-04-19 DIAGNOSIS — N184 Chronic kidney disease, stage 4 (severe): Secondary | ICD-10-CM

## 2018-04-19 DIAGNOSIS — R110 Nausea: Secondary | ICD-10-CM | POA: Diagnosis not present

## 2018-04-19 DIAGNOSIS — I2781 Cor pulmonale (chronic): Secondary | ICD-10-CM | POA: Diagnosis present

## 2018-04-19 DIAGNOSIS — I361 Nonrheumatic tricuspid (valve) insufficiency: Secondary | ICD-10-CM | POA: Diagnosis not present

## 2018-04-19 DIAGNOSIS — I50813 Acute on chronic right heart failure: Secondary | ICD-10-CM | POA: Diagnosis not present

## 2018-04-19 DIAGNOSIS — R0602 Shortness of breath: Secondary | ICD-10-CM | POA: Diagnosis not present

## 2018-04-19 DIAGNOSIS — R7401 Elevation of levels of liver transaminase levels: Secondary | ICD-10-CM

## 2018-04-19 DIAGNOSIS — J440 Chronic obstructive pulmonary disease with acute lower respiratory infection: Secondary | ICD-10-CM | POA: Diagnosis present

## 2018-04-19 DIAGNOSIS — K7689 Other specified diseases of liver: Secondary | ICD-10-CM | POA: Diagnosis not present

## 2018-04-19 DIAGNOSIS — K761 Chronic passive congestion of liver: Secondary | ICD-10-CM | POA: Diagnosis not present

## 2018-04-19 DIAGNOSIS — E11649 Type 2 diabetes mellitus with hypoglycemia without coma: Secondary | ICD-10-CM | POA: Diagnosis present

## 2018-04-19 DIAGNOSIS — Z87891 Personal history of nicotine dependence: Secondary | ICD-10-CM

## 2018-04-19 DIAGNOSIS — I5032 Chronic diastolic (congestive) heart failure: Secondary | ICD-10-CM | POA: Diagnosis present

## 2018-04-19 DIAGNOSIS — R402413 Glasgow coma scale score 13-15, at hospital admission: Secondary | ICD-10-CM | POA: Diagnosis present

## 2018-04-19 DIAGNOSIS — F329 Major depressive disorder, single episode, unspecified: Secondary | ICD-10-CM | POA: Diagnosis present

## 2018-04-19 DIAGNOSIS — M1990 Unspecified osteoarthritis, unspecified site: Secondary | ICD-10-CM | POA: Diagnosis present

## 2018-04-19 DIAGNOSIS — Z79891 Long term (current) use of opiate analgesic: Secondary | ICD-10-CM

## 2018-04-19 DIAGNOSIS — J9601 Acute respiratory failure with hypoxia: Secondary | ICD-10-CM | POA: Diagnosis not present

## 2018-04-19 DIAGNOSIS — R0600 Dyspnea, unspecified: Secondary | ICD-10-CM | POA: Diagnosis not present

## 2018-04-19 DIAGNOSIS — Z888 Allergy status to other drugs, medicaments and biological substances status: Secondary | ICD-10-CM

## 2018-04-19 DIAGNOSIS — E1151 Type 2 diabetes mellitus with diabetic peripheral angiopathy without gangrene: Secondary | ICD-10-CM | POA: Diagnosis present

## 2018-04-19 DIAGNOSIS — K219 Gastro-esophageal reflux disease without esophagitis: Secondary | ICD-10-CM | POA: Diagnosis present

## 2018-04-19 DIAGNOSIS — Z79899 Other long term (current) drug therapy: Secondary | ICD-10-CM

## 2018-04-19 DIAGNOSIS — Z794 Long term (current) use of insulin: Secondary | ICD-10-CM

## 2018-04-19 HISTORY — DX: Chronic kidney disease, stage 4 (severe): N18.4

## 2018-04-19 HISTORY — DX: Type 2 diabetes mellitus with diabetic neuropathy, unspecified: E11.40

## 2018-04-19 HISTORY — DX: Atherosclerotic heart disease of native coronary artery without angina pectoris: I25.10

## 2018-04-19 LAB — CBC WITH DIFFERENTIAL/PLATELET
BASOS ABS: 0.1 10*3/uL (ref 0–0.1)
BASOS PCT: 1 %
EOS ABS: 0 10*3/uL (ref 0–0.7)
Eosinophils Relative: 0 %
HEMATOCRIT: 38.8 % (ref 35.0–47.0)
HEMOGLOBIN: 11 g/dL — AB (ref 12.0–16.0)
Lymphocytes Relative: 10 %
Lymphs Abs: 1.3 10*3/uL (ref 1.0–3.6)
MCH: 19.2 pg — ABNORMAL LOW (ref 26.0–34.0)
MCHC: 28.3 g/dL — ABNORMAL LOW (ref 32.0–36.0)
MCV: 67.8 fL — ABNORMAL LOW (ref 80.0–100.0)
Monocytes Absolute: 1.1 10*3/uL — ABNORMAL HIGH (ref 0.2–0.9)
Monocytes Relative: 8 %
NEUTROS ABS: 11.2 10*3/uL — AB (ref 1.4–6.5)
NEUTROS PCT: 81 %
Platelets: 307 10*3/uL (ref 150–440)
RBC: 5.71 MIL/uL — ABNORMAL HIGH (ref 3.80–5.20)
RDW: 21.7 % — ABNORMAL HIGH (ref 11.5–14.5)
WBC: 13.8 10*3/uL — AB (ref 3.6–11.0)

## 2018-04-19 LAB — COMPREHENSIVE METABOLIC PANEL
ALBUMIN: 3.5 g/dL (ref 3.5–5.0)
ALK PHOS: 80 U/L (ref 38–126)
ALT: 646 U/L — ABNORMAL HIGH (ref 14–54)
AST: 1643 U/L — ABNORMAL HIGH (ref 15–41)
Anion gap: 25 — ABNORMAL HIGH (ref 5–15)
BILIRUBIN TOTAL: 2.6 mg/dL — AB (ref 0.3–1.2)
BUN: 42 mg/dL — AB (ref 6–20)
CALCIUM: 9.4 mg/dL (ref 8.9–10.3)
CO2: 19 mmol/L — AB (ref 22–32)
Chloride: 95 mmol/L — ABNORMAL LOW (ref 101–111)
Creatinine, Ser: 2.78 mg/dL — ABNORMAL HIGH (ref 0.44–1.00)
GFR calc Af Amer: 20 mL/min — ABNORMAL LOW (ref >60)
GFR calc non Af Amer: 17 mL/min — ABNORMAL LOW (ref >60)
GLUCOSE: 82 mg/dL (ref 65–99)
POTASSIUM: 4.5 mmol/L (ref 3.5–5.1)
SODIUM: 139 mmol/L (ref 135–145)
TOTAL PROTEIN: 7.3 g/dL (ref 6.5–8.1)

## 2018-04-19 LAB — PROTIME-INR
INR: 2.88
Prothrombin Time: 29.9 seconds — ABNORMAL HIGH (ref 11.4–15.2)

## 2018-04-19 LAB — TROPONIN I: Troponin I: 0.56 ng/mL (ref <0.03)

## 2018-04-19 LAB — LACTIC ACID, PLASMA: LACTIC ACID, VENOUS: 11.5 mmol/L — AB (ref 0.5–1.9)

## 2018-04-19 LAB — BRAIN NATRIURETIC PEPTIDE: B NATRIURETIC PEPTIDE 5: 1679 pg/mL — AB (ref 0.0–100.0)

## 2018-04-19 MED ORDER — IPRATROPIUM-ALBUTEROL 0.5-2.5 (3) MG/3ML IN SOLN
3.0000 mL | Freq: Once | RESPIRATORY_TRACT | Status: AC
  Administered 2018-04-19: 3 mL via RESPIRATORY_TRACT
  Filled 2018-04-19: qty 3

## 2018-04-19 MED ORDER — ONDANSETRON HCL 4 MG/2ML IJ SOLN
4.0000 mg | Freq: Once | INTRAMUSCULAR | Status: AC
  Administered 2018-04-19: 4 mg via INTRAVENOUS
  Filled 2018-04-19: qty 2

## 2018-04-19 MED ORDER — PIPERACILLIN-TAZOBACTAM 3.375 G IVPB 30 MIN
3.3750 g | Freq: Once | INTRAVENOUS | Status: AC
  Administered 2018-04-20: 3.375 g via INTRAVENOUS
  Filled 2018-04-19: qty 50

## 2018-04-19 MED ORDER — VANCOMYCIN HCL IN DEXTROSE 1-5 GM/200ML-% IV SOLN
1000.0000 mg | Freq: Once | INTRAVENOUS | Status: AC
  Administered 2018-04-20: 1000 mg via INTRAVENOUS
  Filled 2018-04-19: qty 200

## 2018-04-19 MED ORDER — SODIUM CHLORIDE 0.9 % IV BOLUS
1000.0000 mL | Freq: Once | INTRAVENOUS | Status: AC
  Administered 2018-04-19: 1000 mL via INTRAVENOUS

## 2018-04-19 NOTE — ED Notes (Signed)
Date and time results received: 04/19/18 2312  Test: Lactic Acid Critical Value: 11.5  Name of Provider Notified: Dr. Archie Balboa  Orders Received? Or Actions Taken?: Acknowledged

## 2018-04-19 NOTE — ED Notes (Signed)
X-ray at bedside

## 2018-04-19 NOTE — ED Triage Notes (Signed)
Patient presents to Emergency Department via EMS with complaints of weakness, hypotension, and SOB.    Per EMS no recent falls, rapid stroke scan negative, pt on  4 lpm [place] continuous d/t COPD and EMS found pt at 79%.  Hx of HTN and CHF.  Pt reports no pain att, but had chest tightness earlier today

## 2018-04-19 NOTE — ED Provider Notes (Signed)
[facility] Emergency Department Provider Note   ____________________________________________   I have reviewed the triage vital signs and the nursing notes.   HISTORY  Chief Complaint Weakness; Hypotension; and Shortness of Breath   History limited by: Not Limited   HPI Lindsay Harmon is a 62 y.o. female who presents to the emergency department today via EMS because of concerns for feeling unwell and weakness.  Patient states that she started feeling bad yesterday evening.  Perhaps throughout the day.  She describes it as a sense of feeling weak.  She has had some nausea.  Patient's family stated that she also appeared to be short of breath however the patient did not appreciate that she was more short of breath.  States she does have a history of COPD is on 4 L at baseline.  She did have an occasional episodes of chest tightness however denies any pain.  She denied any abdominal pain with the nausea.  She denies any fevers or known sick contacts.   Per medical record review patient has a history of COPD, CAD  Past Medical History:  Diagnosis Date  . Allergy   . Aortic atherosclerosis ([place])   . Arthritis   . CAD (coronary artery disease)   . COPD (chronic obstructive pulmonary disease) ([place])   . Depression   . Esophageal stricture   . Familial hematuria   . GERD (gastroesophageal reflux disease)   . Hyperlipidemia   . Hypertension   . IBS (irritable bowel syndrome)   . Nephrolithiasis   . NIDDM (non-insulin dependent diabetes mellitus)    with neuropathy  . Obesity, unspecified   . Pulmonary hypertension ([place])   . PVD (peripheral vascular disease) [facility])     Patient Active Problem List   Diagnosis Date Noted  . Aortic atherosclerosis ([place])   . Trochanteric bursitis of right hip 01/21/2018  . Narcotic dependence ([place]) 07/23/2017  . Personal history of tobacco use, presenting hazards to health 04/11/2017  . Encounter for chronic pain management  03/21/2017  . Bile duct calculus without cholecystitis and no obstruction   . Elevated liver function tests 02/05/2017  . Hyposmolality and/or hyponatremia 06/22/2015  . Cor pulmonale, chronic ([place])   . Pulmonary hypertension, unspecified ([place])   . Hyponatremia 06/02/2015  . RVF (right ventricular failure) ([place]) 01/17/2015  . Chronic respiratory failure with hypoxia ([place]) 12/15/2014  . COPD (chronic obstructive pulmonary disease) ([place]) 12/15/2014  . Chronic diastolic heart failure ([place]) 12/05/2014  . Chronic renal disease, stage IV ([place]) 12/01/2014  . Advance directive discussed with patient 11/08/2014  . DM (diabetes mellitus), type 2, uncontrolled, periph vascular complic ([place]) 62/22/9798  . Diabetes, polyneuropathy ([place]) 09/15/2013  . Routine general medical examination at a health care facility 08/13/2011  . Atherosclerotic heart disease of native coronary artery with angina pectoris ([place]) 02/27/2007  . Chronic back pain 02/27/2007  . Type 2 diabetes mellitus with neurological manifestations, controlled ([place]) 02/26/2007  . Hyperlipemia 02/26/2007  . Episodic mood disorder ([place]) 02/26/2007  . Essential hypertension, benign 02/26/2007  . PVD (peripheral vascular disease) ([place]) 02/26/2007  . GERD 02/26/2007  . DEGENERATIVE JOINT DISEASE 02/26/2007    Past Surgical History:  Procedure Laterality Date  . ABDOMINAL HYSTERECTOMY    . CARPAL TUNNEL RELEASE    . CESAREAN SECTION    . ERCP N/A 03/17/2017   Procedure: ENDOSCOPIC RETROGRADE CHOLANGIOPANCREATOGRAPHY (ERCP);  Surgeon: [name], MD;  Location: [facility] ENDOSCOPY;  Service: Endoscopy;  Laterality: N/A;  . EUS N/A  03/13/2017   Procedure: FULL UPPER ENDOSCOPIC ULTRASOUND (EUS) RADIAL;  Surgeon: Burbridge, Murray Hodgkins, MD;  Location: [facility] ENDOSCOPY;  Service: Endoscopy;  Laterality: N/A;  . RIGHT HEART CATH N/A 01/22/2018   Procedure: RIGHT HEART CATH;  Surgeon: Jolaine Artist, MD;  Location: [facility] CV LAB;  Service:  Cardiovascular;  Laterality: N/A;  . RIGHT HEART CATHETERIZATION N/A 12/05/2014   Procedure: RIGHT HEART CATH;  Surgeon: Sinclair Grooms, MD;  Location: [facility] CATH LAB;  Service: Cardiovascular;  Laterality: N/A;    Prior to Admission medications   Medication Sig Start Date End Date Taking? Authorizing Provider  aspirin 325 MG tablet Take 325 mg by mouth at bedtime.     [provider]  calcitRIOL (ROCALTROL) 0.5 MCG capsule Take 0.5 mcg by mouth daily.  02/03/17   [provider]  cholecalciferol (VITAMIN D) 1000 units tablet Take 1,000 Units by mouth at bedtime.     [provider]  enalapril (VASOTEC) 5 MG tablet Take 1 tablet (5 mg total) by mouth daily. 01/29/17   Fritzi Mandes, MD  gabapentin (NEURONTIN) 300 MG capsule Take 1 capsule by mouth in the morning, 1 capsule at noon, and 2 capsules at bedtime 09/15/17   Venia Carbon, MD  glipiZIDE (GLUCOTROL) 5 MG tablet Take 1 tablet by mouth twice a day before a meal 03/09/18   Viviana Simpler I, MD  Insulin Glargine (LANTUS) 100 UNIT/ML Solostar Pen Inject 32 Units into the skin daily. Patient taking differently: Inject 30 Units into the skin daily.  07/23/17   Venia Carbon, MD  Insulin Pen Needle (SURE COMFORT PEN NEEDLES) 31G X 5 MM MISC USE 1 PEN NEEDLE TO INJECT INSULIN 07/23/17   Venia Carbon, MD  loratadine (CLARITIN) 10 MG tablet Take 10 mg by mouth 2 (two) times daily.     [provider]  lovastatin (MEVACOR) 40 MG tablet Take 2 tablets (80 mg total) by mouth at bedtime. 12/05/17   Venia Carbon, MD  methadone (DOLOPHINE) 10 MG tablet Take 2 tablets (20 mg total) by mouth 4 (four) times daily. 04/17/18   Venia Carbon, MD  metoprolol tartrate (LOPRESSOR) 25 MG tablet Take 0.5 tablets (12.5 mg total) by mouth 2 (two) times daily. 07/01/17   Venia Carbon, MD  mirtazapine (REMERON) 30 MG tablet Take 15 mg by mouth at bedtime.    [provider]  nitroGLYCERIN (NITROSTAT) 0.4 MG SL  tablet Place 1 tablet (0.4 mg total) under the tongue every 5 (five) minutes as needed for chest pain. 07/23/16   Venia Carbon, MD  ONE TOUCH ULTRA TEST test strip USE 1 STRIP TO TEST BLOOD SUGAR ONCE DAILY 07/21/17   Venia Carbon, MD  pantoprazole (PROTONIX) 40 MG tablet Take 1 tablet by mouth twice a day 10/03/17   Venia Carbon, MD  sildenafil (REVATIO) 20 MG tablet Take 1 tablet (20 mg total) by mouth 3 (three) times daily. 02/19/18   Bensimhon, Shaune Pascal, MD  STIOLTO RESPIMAT 2.5-2.5 MCG/ACT AERS Inhale 2 puffs into the lungs daily. 11/03/17   Juanito Doom, MD  torsemide (DEMADEX) 20 MG tablet Take 2 tablets (40 mg total) by mouth daily. 01/07/18   Bensimhon, Shaune Pascal, MD  VENTOLIN HFA 108 (90 Base) MCG/ACT inhaler USE 2 PUFFS EVERY SIX HOURS AS NEEDED FOR WHEEZING OR SHORTNESS OF BREATH 08/28/17   Juanito Doom, MD    Allergies Sertraline hcl  Family History  Problem  Relation Age of Onset  . Diabetes Mother   . Cancer Mother        ovarian,melanoma  . Allergies Father   . Cancer Father        lung  . Cancer Maternal Grandmother        uterine  . Emphysema Paternal Grandfather   . Allergies Sister   . Breast cancer Neg Hx     Social History Social History   Tobacco Use  . Smoking status: Former Smoker    Packs/day: 1.50    Years: 20.00    Pack years: 30.00    Types: Cigarettes    Last attempt to quit: 01/04/2009    Years since quitting: 9.2  . Smokeless tobacco: Never Used  Substance Use Topics  . Alcohol use: No    Alcohol/week: 0.0 oz    Comment: heavy in the past  . Drug use: No    Review of Systems Constitutional: No fever/chills. Positive for weakness. Eyes: No visual changes. ENT: No sore throat. Cardiovascular: Denies chest pain. Respiratory: Denies shortness of breath. Gastrointestinal: No abdominal pain.  Positive for nausea.  Genitourinary: Negative for dysuria. Musculoskeletal: Negative for back pain. Skin: Negative for  rash. Neurological: Negative for headaches, focal weakness or numbness.  ____________________________________________   PHYSICAL EXAM:  VITAL SIGNS: ED Triage Vitals  Enc Vitals Group     BP 04/19/18 2209 (!) 102/41     Pulse Rate 04/19/18 2209 (!) 110     Resp 04/19/18 2209 17     Temp 04/19/18 2209 98 F (36.7 C)     Temp Source 04/19/18 2209 Oral     SpO2 04/19/18 2200 (!) 79 %     Weight 04/19/18 2212 165 lb (74.8 kg)     Height 04/19/18 2212 _0  (1.549 m)     Head Circumference --      Peak Flow --      Pain Score 04/19/18 2212 0   Constitutional: Alert and oriented.  Eyes: Conjunctivae are normal.  ENT      Head: Normocephalic and atraumatic.      Nose: No congestion/rhinnorhea.      Mouth/Throat: Mucous membranes are moist.      Neck: No stridor. Hematological/Lymphatic/Immunilogical: No cervical lymphadenopathy. Cardiovascular: Tachycardic, regular rhythm.  No murmurs, rubs, or gallops.  Respiratory: Mildly increased work of breathing. Diminished breath sounds diffusely. Gastrointestinal: Soft and non tender. No rebound. No guarding.  Genitourinary: Deferred Musculoskeletal: Normal range of motion in all extremities. Neurologic:  Normal speech and language. No gross focal neurologic deficits are appreciated.  Skin:  Skin is warm, dry and intact. No rash noted. Psychiatric: Mood and affect are normal. Speech and behavior are normal. Patient exhibits appropriate insight and judgment.  ____________________________________________    LABS (pertinent positives/negatives)  Lactic 11.5 BNP 1679 CBC wbc 13.8, hgb 11.0, plt 307 CMP na 139, k 4.5, cr 2.78, ast 1643, ALT 646, tot bili 2.6  ____________________________________________   EKG  .Apolonio Schneiders, attending physician, personally viewed and interpreted this EKG  EKG Time: 2207 Rate: 111 Rhythm: sinus tachycardia Axis: normal Intervals: qtc 509 QRS: narrow, q waves v1 ST changes: no st  elevation Impression: abnormal ekg   ____________________________________________    RADIOLOGY  CXR Pulmonary hypertension and COPD. No pneumonia  ____________________________________________   PROCEDURES  Procedures  CRITICAL CARE Performed by: Nance Pear   Total critical care time: 40 minutes  Critical care time was exclusive of separately billable procedures and treating other  patients.  Critical care was necessary to treat or prevent imminent or life-threatening deterioration.  Critical care was time spent personally by me on the following activities: development of treatment plan with patient and/or surrogate as well as nursing, discussions with consultants, evaluation of patient's response to treatment, examination of patient, obtaining history from patient or surrogate, ordering and performing treatments and interventions, ordering and review of laboratory studies, ordering and review of radiographic studies, pulse oximetry and re-evaluation of patient's condition.  ____________________________________________   INITIAL IMPRESSION / ASSESSMENT AND PLAN / ED COURSE  Pertinent labs & imaging results that were available during my care of the patient were reviewed by me and considered in my medical decision making (see chart for details).   Patient presented to the emergency department today because of concerns for weakness and feeling unwell.  While the patient herself did not appreciate that she was short of breath she had been hypoxic on her home oxygen and family states she was having some difficulty with her breathing.  She was also noted be mildly tachycardic.  Differential would be broad including pneumonia, COPD exacerbation, ACS amongst other etiologies.  Patient had broad work-up ordered.  Blood work was notable for multiple findings.  Troponin was elevated BNP was elevated lactic was severely elevated AST ALT and total bilirubin were elevated white blood  cells were elevated.  This point unclear etiology of all these findings.  Would have concern for sepsis of broad-spectrum antibiotics were started.  However also do not know if findings could be secondary to hypotension or hypoxia.  Discussed findings and plan with patient and family.  Will plan on admission to the hospital service.   ____________________________________________   FINAL CLINICAL IMPRESSION(S) / ED DIAGNOSES  Final diagnoses:  Elevated lactic acid level  Elevated troponin  Elevated liver enzymes  Hypoxia  Leukocytosis, unspecified type     Note: This dictation was prepared with Dragon dictation. Any transcriptional errors that result from this process are unintentional     Nance Pear, MD 04/20/18 1542

## 2018-04-20 ENCOUNTER — Encounter: Payer: Self-pay | Admitting: Internal Medicine

## 2018-04-20 ENCOUNTER — Encounter: Payer: Self-pay | Admitting: *Deleted

## 2018-04-20 ENCOUNTER — Inpatient Hospital Stay: Payer: PPO

## 2018-04-20 ENCOUNTER — Other Ambulatory Visit: Payer: Self-pay

## 2018-04-20 DIAGNOSIS — I959 Hypotension, unspecified: Secondary | ICD-10-CM | POA: Diagnosis present

## 2018-04-20 DIAGNOSIS — J9601 Acute respiratory failure with hypoxia: Secondary | ICD-10-CM | POA: Diagnosis not present

## 2018-04-20 DIAGNOSIS — Z7189 Other specified counseling: Secondary | ICD-10-CM | POA: Diagnosis not present

## 2018-04-20 DIAGNOSIS — Z515 Encounter for palliative care: Secondary | ICD-10-CM | POA: Diagnosis present

## 2018-04-20 DIAGNOSIS — I5082 Biventricular heart failure: Secondary | ICD-10-CM | POA: Diagnosis present

## 2018-04-20 DIAGNOSIS — B179 Acute viral hepatitis, unspecified: Secondary | ICD-10-CM | POA: Diagnosis present

## 2018-04-20 DIAGNOSIS — J9621 Acute and chronic respiratory failure with hypoxia: Secondary | ICD-10-CM | POA: Diagnosis present

## 2018-04-20 DIAGNOSIS — G4733 Obstructive sleep apnea (adult) (pediatric): Secondary | ICD-10-CM | POA: Diagnosis present

## 2018-04-20 DIAGNOSIS — Z9981 Dependence on supplemental oxygen: Secondary | ICD-10-CM | POA: Diagnosis not present

## 2018-04-20 DIAGNOSIS — R110 Nausea: Secondary | ICD-10-CM | POA: Diagnosis not present

## 2018-04-20 DIAGNOSIS — I071 Rheumatic tricuspid insufficiency: Secondary | ICD-10-CM | POA: Diagnosis present

## 2018-04-20 DIAGNOSIS — I5032 Chronic diastolic (congestive) heart failure: Secondary | ICD-10-CM | POA: Diagnosis present

## 2018-04-20 DIAGNOSIS — E1140 Type 2 diabetes mellitus with diabetic neuropathy, unspecified: Secondary | ICD-10-CM | POA: Diagnosis present

## 2018-04-20 DIAGNOSIS — D689 Coagulation defect, unspecified: Secondary | ICD-10-CM | POA: Diagnosis present

## 2018-04-20 DIAGNOSIS — R748 Abnormal levels of other serum enzymes: Secondary | ICD-10-CM | POA: Diagnosis not present

## 2018-04-20 DIAGNOSIS — J440 Chronic obstructive pulmonary disease with acute lower respiratory infection: Secondary | ICD-10-CM | POA: Diagnosis present

## 2018-04-20 DIAGNOSIS — R0902 Hypoxemia: Secondary | ICD-10-CM | POA: Diagnosis present

## 2018-04-20 DIAGNOSIS — Z66 Do not resuscitate: Secondary | ICD-10-CM | POA: Diagnosis not present

## 2018-04-20 DIAGNOSIS — E1151 Type 2 diabetes mellitus with diabetic peripheral angiopathy without gangrene: Secondary | ICD-10-CM | POA: Diagnosis present

## 2018-04-20 DIAGNOSIS — E11649 Type 2 diabetes mellitus with hypoglycemia without coma: Secondary | ICD-10-CM | POA: Diagnosis present

## 2018-04-20 DIAGNOSIS — K7200 Acute and subacute hepatic failure without coma: Secondary | ICD-10-CM | POA: Diagnosis present

## 2018-04-20 DIAGNOSIS — I2721 Secondary pulmonary arterial hypertension: Secondary | ICD-10-CM | POA: Diagnosis present

## 2018-04-20 DIAGNOSIS — I248 Other forms of acute ischemic heart disease: Secondary | ICD-10-CM | POA: Diagnosis present

## 2018-04-20 DIAGNOSIS — I130 Hypertensive heart and chronic kidney disease with heart failure and stage 1 through stage 4 chronic kidney disease, or unspecified chronic kidney disease: Secondary | ICD-10-CM | POA: Diagnosis present

## 2018-04-20 DIAGNOSIS — N179 Acute kidney failure, unspecified: Secondary | ICD-10-CM | POA: Diagnosis present

## 2018-04-20 DIAGNOSIS — N184 Chronic kidney disease, stage 4 (severe): Secondary | ICD-10-CM | POA: Diagnosis present

## 2018-04-20 DIAGNOSIS — I272 Pulmonary hypertension, unspecified: Secondary | ICD-10-CM | POA: Diagnosis not present

## 2018-04-20 DIAGNOSIS — E1122 Type 2 diabetes mellitus with diabetic chronic kidney disease: Secondary | ICD-10-CM | POA: Diagnosis present

## 2018-04-20 DIAGNOSIS — J189 Pneumonia, unspecified organism: Secondary | ICD-10-CM | POA: Diagnosis present

## 2018-04-20 DIAGNOSIS — I361 Nonrheumatic tricuspid (valve) insufficiency: Secondary | ICD-10-CM | POA: Diagnosis not present

## 2018-04-20 DIAGNOSIS — I50813 Acute on chronic right heart failure: Secondary | ICD-10-CM | POA: Diagnosis not present

## 2018-04-20 DIAGNOSIS — K761 Chronic passive congestion of liver: Secondary | ICD-10-CM | POA: Diagnosis present

## 2018-04-20 LAB — BLOOD CULTURE ID PANEL (REFLEXED)
Acinetobacter baumannii: NOT DETECTED
CANDIDA GLABRATA: NOT DETECTED
CANDIDA KRUSEI: NOT DETECTED
CANDIDA PARAPSILOSIS: NOT DETECTED
Candida albicans: NOT DETECTED
Candida tropicalis: NOT DETECTED
ENTEROBACTER CLOACAE COMPLEX: NOT DETECTED
ESCHERICHIA COLI: NOT DETECTED
Enterobacteriaceae species: NOT DETECTED
Enterococcus species: NOT DETECTED
Haemophilus influenzae: NOT DETECTED
KLEBSIELLA OXYTOCA: NOT DETECTED
Klebsiella pneumoniae: NOT DETECTED
Listeria monocytogenes: NOT DETECTED
Neisseria meningitidis: NOT DETECTED
PROTEUS SPECIES: NOT DETECTED
Pseudomonas aeruginosa: NOT DETECTED
STAPHYLOCOCCUS AUREUS BCID: NOT DETECTED
STAPHYLOCOCCUS SPECIES: NOT DETECTED
Serratia marcescens: NOT DETECTED
Streptococcus agalactiae: NOT DETECTED
Streptococcus pneumoniae: NOT DETECTED
Streptococcus pyogenes: NOT DETECTED
Streptococcus species: NOT DETECTED

## 2018-04-20 LAB — RAPID HIV SCREEN (HIV 1/2 AB+AG)
HIV 1/2 Antibodies: NONREACTIVE
HIV-1 P24 ANTIGEN - HIV24: NONREACTIVE

## 2018-04-20 LAB — TSH: TSH: 1.462 u[IU]/mL (ref 0.350–4.500)

## 2018-04-20 LAB — GLUCOSE, CAPILLARY
GLUCOSE-CAPILLARY: 178 mg/dL — AB (ref 65–99)
Glucose-Capillary: 246 mg/dL — ABNORMAL HIGH (ref 65–99)
Glucose-Capillary: 186 mg/dL — ABNORMAL HIGH (ref 65–99)
Glucose-Capillary: 110 mg/dL — ABNORMAL HIGH (ref 65–99)
Glucose-Capillary: 198 mg/dL — ABNORMAL HIGH (ref 65–99)

## 2018-04-20 LAB — APTT: aPTT: 49 seconds — ABNORMAL HIGH (ref 24–36)

## 2018-04-20 LAB — COMPREHENSIVE METABOLIC PANEL
ALT: 966 U/L — ABNORMAL HIGH (ref 14–54)
ANION GAP: 13 (ref 5–15)
AST: 2201 U/L — AB (ref 15–41)
Albumin: 3.2 g/dL — ABNORMAL LOW (ref 3.5–5.0)
Alkaline Phosphatase: 65 U/L (ref 38–126)
BUN: 46 mg/dL — ABNORMAL HIGH (ref 6–20)
CHLORIDE: 96 mmol/L — AB (ref 101–111)
CO2: 24 mmol/L (ref 22–32)
Calcium: 8.4 mg/dL — ABNORMAL LOW (ref 8.9–10.3)
Creatinine, Ser: 2.41 mg/dL — ABNORMAL HIGH (ref 0.44–1.00)
GFR calc non Af Amer: 20 mL/min — ABNORMAL LOW (ref >60)
GFR, EST AFRICAN AMERICAN: 23 mL/min — AB (ref >60)
Glucose, Bld: 150 mg/dL — ABNORMAL HIGH (ref 65–99)
Potassium: 4.1 mmol/L (ref 3.5–5.1)
Sodium: 133 mmol/L — ABNORMAL LOW (ref 135–145)
Total Bilirubin: 1.8 mg/dL — ABNORMAL HIGH (ref 0.3–1.2)
Total Protein: 6.3 g/dL — ABNORMAL LOW (ref 6.5–8.1)

## 2018-04-20 LAB — IRON AND TIBC
Iron: 14 ug/dL — ABNORMAL LOW (ref 28–170)
SATURATION RATIOS: 3 % — AB (ref 10.4–31.8)
TIBC: 417 ug/dL (ref 250–450)
UIBC: 403 ug/dL

## 2018-04-20 LAB — PROTIME-INR
INR: 3.8
Prothrombin Time: 37.2 seconds — ABNORMAL HIGH (ref 11.4–15.2)

## 2018-04-20 LAB — BLOOD GAS, ARTERIAL
ACID-BASE EXCESS: 1.1 mmol/L (ref 0.0–2.0)
BICARBONATE: 26.6 mmol/L (ref 20.0–28.0)
FIO2: 36
O2 Saturation: 87.5 %
PATIENT TEMPERATURE: 37
pCO2 arterial: 45 mmHg (ref 32.0–48.0)
pH, Arterial: 7.38 (ref 7.350–7.450)
pO2, Arterial: 55 mmHg — ABNORMAL LOW (ref 83.0–108.0)

## 2018-04-20 LAB — HEPARIN LEVEL (UNFRACTIONATED): HEPARIN UNFRACTIONATED: 0.42 [IU]/mL (ref 0.30–0.70)

## 2018-04-20 LAB — LACTIC ACID, PLASMA: Lactic Acid, Venous: 3.8 mmol/L (ref 0.5–1.9)

## 2018-04-20 LAB — SALICYLATE LEVEL: Salicylate Lvl: <7 mg/dL (ref 2.8–30.0)

## 2018-04-20 LAB — TROPONIN I
TROPONIN I: 1.44 ng/mL — AB (ref <0.03)
TROPONIN I: 1.79 ng/mL — AB (ref <0.03)
Troponin I: 1.56 ng/mL (ref <0.03)

## 2018-04-20 LAB — RETICULOCYTES
RBC.: 5.86 MIL/uL — ABNORMAL HIGH (ref 3.80–5.20)
RETIC COUNT ABSOLUTE: 111.3 10*3/uL (ref 19.0–183.0)
RETIC CT PCT: 1.9 % (ref 0.4–3.1)

## 2018-04-20 LAB — CBC
HEMATOCRIT: 32.5 % — AB (ref 35.0–47.0)
Hemoglobin: 9.7 g/dL — ABNORMAL LOW (ref 12.0–16.0)
MCH: 19.5 pg — ABNORMAL LOW (ref 26.0–34.0)
MCHC: 30 g/dL — ABNORMAL LOW (ref 32.0–36.0)
MCV: 65.1 fL — ABNORMAL LOW (ref 80.0–100.0)
PLATELETS: 245 10*3/uL (ref 150–440)
RBC: 4.99 MIL/uL (ref 3.80–5.20)
RDW: 21.7 % — AB (ref 11.5–14.5)
WBC: 13.8 10*3/uL — ABNORMAL HIGH (ref 3.6–11.0)

## 2018-04-20 LAB — PHOSPHORUS: Phosphorus: 4.2 mg/dL (ref 2.5–4.6)

## 2018-04-20 LAB — TRANSFERRIN: TRANSFERRIN: 316 mg/dL (ref 192–382)

## 2018-04-20 LAB — MAGNESIUM: Magnesium: 1.4 mg/dL — ABNORMAL LOW (ref 1.7–2.4)

## 2018-04-20 LAB — FERRITIN: Ferritin: 27 ng/mL (ref 11–307)

## 2018-04-20 LAB — ETHANOL: Alcohol, Ethyl (B): <10 mg/dL (ref <10)

## 2018-04-20 LAB — PROCALCITONIN: Procalcitonin: 0.26 ng/mL

## 2018-04-20 LAB — ACETAMINOPHEN LEVEL: Acetaminophen (Tylenol), Serum: <10 ug/mL — ABNORMAL LOW (ref 10–30)

## 2018-04-20 MED ORDER — PIPERACILLIN-TAZOBACTAM 3.375 G IVPB
3.3750 g | Freq: Once | INTRAVENOUS | Status: AC
  Administered 2018-04-20: 3.375 g via INTRAVENOUS
  Filled 2018-04-20: qty 50

## 2018-04-20 MED ORDER — ONDANSETRON HCL 4 MG PO TABS
4.0000 mg | ORAL_TABLET | Freq: Four times a day (QID) | ORAL | Status: DC | PRN
  Administered 2018-04-20 – 2018-04-24 (×2): 4 mg via ORAL
  Filled 2018-04-20 (×2): qty 1

## 2018-04-20 MED ORDER — ONDANSETRON HCL 4 MG/2ML IJ SOLN
4.0000 mg | Freq: Four times a day (QID) | INTRAMUSCULAR | Status: DC | PRN
  Administered 2018-04-21 – 2018-04-22 (×3): 4 mg via INTRAVENOUS
  Filled 2018-04-20 (×3): qty 2

## 2018-04-20 MED ORDER — PIPERACILLIN-TAZOBACTAM 3.375 G IVPB
3.3750 g | Freq: Three times a day (TID) | INTRAVENOUS | Status: DC
  Administered 2018-04-21 – 2018-04-22 (×5): 3.375 g via INTRAVENOUS
  Filled 2018-04-20 (×5): qty 50

## 2018-04-20 MED ORDER — SODIUM CHLORIDE 0.9 % IV SOLN
INTRAVENOUS | Status: AC
  Administered 2018-04-20 (×2): via INTRAVENOUS

## 2018-04-20 MED ORDER — HEPARIN (PORCINE) IN NACL 100-0.45 UNIT/ML-% IJ SOLN
1300.0000 [IU]/h | INTRAMUSCULAR | Status: DC
  Administered 2018-04-20: 1300 [IU]/h via INTRAVENOUS
  Filled 2018-04-20: qty 250

## 2018-04-20 MED ORDER — TECHNETIUM TO 99M ALBUMIN AGGREGATED
4.3600 | Freq: Once | INTRAVENOUS | Status: AC | PRN
  Administered 2018-04-20: 4.36 via INTRAVENOUS

## 2018-04-20 MED ORDER — MENTHOL 3 MG MT LOZG
1.0000 | LOZENGE | OROMUCOSAL | Status: DC | PRN
  Filled 2018-04-20: qty 9

## 2018-04-20 MED ORDER — SODIUM CHLORIDE 0.9% FLUSH
3.0000 mL | Freq: Two times a day (BID) | INTRAVENOUS | Status: DC
  Administered 2018-04-21 – 2018-04-24 (×8): 3 mL via INTRAVENOUS

## 2018-04-20 MED ORDER — GUAIFENESIN 100 MG/5ML PO SOLN
5.0000 mL | Freq: Four times a day (QID) | ORAL | Status: DC | PRN
  Administered 2018-04-20: 100 mg via ORAL
  Filled 2018-04-20 (×2): qty 5

## 2018-04-20 MED ORDER — TECHNETIUM TC 99M DIETHYLENETRIAME-PENTAACETIC ACID
32.9900 | Freq: Once | INTRAVENOUS | Status: AC | PRN
  Administered 2018-04-20: 32.99 via INTRAVENOUS

## 2018-04-20 MED ORDER — METHADONE HCL 10 MG PO TABS
20.0000 mg | ORAL_TABLET | Freq: Three times a day (TID) | ORAL | Status: DC
  Administered 2018-04-20 – 2018-04-24 (×16): 20 mg via ORAL
  Filled 2018-04-20 (×16): qty 2

## 2018-04-20 MED ORDER — SILDENAFIL CITRATE 20 MG PO TABS
20.0000 mg | ORAL_TABLET | Freq: Three times a day (TID) | ORAL | Status: DC
  Administered 2018-04-20 (×3): 20 mg via ORAL
  Filled 2018-04-20 (×5): qty 1

## 2018-04-20 MED ORDER — SODIUM CHLORIDE 0.9 % IV SOLN
1.0000 g | INTRAVENOUS | Status: DC
  Administered 2018-04-20: 1 g via INTRAVENOUS
  Filled 2018-04-20: qty 1

## 2018-04-20 MED ORDER — HYDROMORPHONE HCL 1 MG/ML IJ SOLN: 0.5000 mg | INTRAMUSCULAR | Status: DC | PRN

## 2018-04-20 MED ORDER — METHADONE HCL 10 MG PO TABS
20.0000 mg | ORAL_TABLET | ORAL | Status: DC | PRN
Start: 2018-04-20 — End: 2018-04-20
  Administered 2018-04-20: 20 mg via ORAL
  Filled 2018-04-20: qty 2

## 2018-04-20 MED ORDER — SENNOSIDES-DOCUSATE SODIUM 8.6-50 MG PO TABS: 1.0000 | ORAL_TABLET | Freq: Every evening | ORAL | Status: DC | PRN

## 2018-04-20 MED ORDER — PANTOPRAZOLE SODIUM 40 MG PO TBEC
40.0000 mg | DELAYED_RELEASE_TABLET | Freq: Two times a day (BID) | ORAL | Status: DC
  Administered 2018-04-20 – 2018-04-24 (×9): 40 mg via ORAL
  Filled 2018-04-20 (×9): qty 1

## 2018-04-20 MED ORDER — IPRATROPIUM-ALBUTEROL 0.5-2.5 (3) MG/3ML IN SOLN: 3.0000 mL | RESPIRATORY_TRACT | Status: DC | PRN

## 2018-04-20 MED ORDER — ENALAPRIL MALEATE 5 MG PO TABS
5.0000 mg | ORAL_TABLET | Freq: Every day | ORAL | Status: DC
  Administered 2018-04-20: 5 mg via ORAL
  Filled 2018-04-20 (×2): qty 1

## 2018-04-20 MED ORDER — INSULIN ASPART 100 UNIT/ML ~~LOC~~ SOLN
0.0000 [IU] | Freq: Three times a day (TID) | SUBCUTANEOUS | Status: DC
  Administered 2018-04-20 – 2018-04-22 (×5): 3 [IU] via SUBCUTANEOUS
  Administered 2018-04-22: 5 [IU] via SUBCUTANEOUS
  Administered 2018-04-22: 2 [IU] via SUBCUTANEOUS
  Administered 2018-04-23 (×2): 3 [IU] via SUBCUTANEOUS
  Administered 2018-04-23: 2 [IU] via SUBCUTANEOUS
  Administered 2018-04-24: 3 [IU] via SUBCUTANEOUS
  Filled 2018-04-20 (×11): qty 1

## 2018-04-20 MED ORDER — HEPARIN BOLUS VIA INFUSION
5000.0000 [IU] | Freq: Once | INTRAVENOUS | Status: AC
  Administered 2018-04-20: 5000 [IU] via INTRAVENOUS
  Filled 2018-04-20: qty 5000

## 2018-04-20 MED ORDER — ASPIRIN 81 MG PO CHEW
81.0000 mg | CHEWABLE_TABLET | Freq: Every day | ORAL | Status: DC
  Administered 2018-04-20 – 2018-04-24 (×5): 81 mg via ORAL
  Filled 2018-04-20 (×5): qty 1

## 2018-04-20 MED ORDER — METOPROLOL TARTRATE 25 MG PO TABS
12.5000 mg | ORAL_TABLET | Freq: Two times a day (BID) | ORAL | Status: DC
  Administered 2018-04-20 – 2018-04-24 (×8): 12.5 mg via ORAL
  Filled 2018-04-20 (×9): qty 1

## 2018-04-20 MED ORDER — MAGNESIUM SULFATE 2 GM/50ML IV SOLN
2.0000 g | INTRAVENOUS | Status: AC
  Administered 2018-04-20 (×2): 2 g via INTRAVENOUS
  Filled 2018-04-20 (×2): qty 50

## 2018-04-20 MED ORDER — NITROGLYCERIN 0.4 MG SL SUBL: 0.4000 mg | SUBLINGUAL_TABLET | SUBLINGUAL | Status: DC | PRN

## 2018-04-20 MED ORDER — AZITHROMYCIN 500 MG IV SOLR
500.0000 mg | INTRAVENOUS | Status: DC
Start: 2018-04-20 — End: 2018-04-22
  Administered 2018-04-20 – 2018-04-22 (×3): 500 mg via INTRAVENOUS
  Filled 2018-04-20 (×3): qty 500

## 2018-04-20 MED ORDER — BISACODYL 5 MG PO TBEC: 5.0000 mg | DELAYED_RELEASE_TABLET | Freq: Every day | ORAL | Status: DC | PRN

## 2018-04-20 NOTE — ED Notes (Signed)
Spoke to charge on tele review appropriateness; call to RT for hi-flow [facility] set-up

## 2018-04-20 NOTE — ED Notes (Signed)
Patient having bedside ultrasound.

## 2018-04-20 NOTE — ED Notes (Signed)
Report finished att, hold for floor 67mn d/t pt care for primary in another room

## 2018-04-20 NOTE — Plan of Care (Signed)
  Problem: Education: Goal: Knowledge of General Education information will improve Outcome: Progressing   Problem: Health Behavior/Discharge Planning: Goal: Ability to manage health-related needs will improve Outcome: Progressing   Problem: Clinical Measurements: Goal: Respiratory complications will improve Outcome: Progressing   Problem: Pain Managment: Goal: General experience of comfort will improve Outcome: Progressing   Problem: Safety: Goal: Ability to remain free from injury will improve Outcome: Progressing

## 2018-04-20 NOTE — Consult Note (Signed)
   [facility] CM Inpatient Consult   04/20/2018  Lindsay Harmon [birth year] [id number]   Referral received from inpatient RNCM, Nann for [place] [facility] for post hospital follow up needs.  Chart review reveals inpatient RNCM notes that the patient is currently unable to converse due to high flow oxygen needs.  Will attempt out reach at a more appropriate time.  Patient in the [employer] plan.  For questions, please contact:  Natividad Brood, RN BSN [facility] Liaison  [phone] business mobile phone Toll free office [phone]

## 2018-04-20 NOTE — Progress Notes (Signed)
ANTICOAGULATION CONSULT NOTE - Initial Consult  Pharmacy Consult for heparin Indication: pulmonary embolus  Allergies  Allergen Reactions  . Sertraline Hcl Other (See Comments)    Cloudy thoughts, malaise    Patient Measurements: Height: _0  (154.9 cm) Weight: 165 lb (74.8 kg) IBW/kg (Calculated) : 47.8 Heparin Dosing Weight: 64   Vital Signs: Temp: 98 F (36.7 C) (06/23 2209) Temp Source: Oral (06/23 2209) BP: 133/75 (06/24 0348) Pulse Rate: 104 (06/24 0348)  Labs: Recent Labs    04/19/18 2214 04/19/18 2220  HGB 11.0*  --   HCT 38.8  --   PLT 307  --   APTT  --  49*  LABPROT 29.9*  --   INR 2.88  --   CREATININE 2.78*  --   TROPONINI 0.56*  --     Estimated Creatinine Clearance: 18.9 mL/min (A) (by C-G formula based on SCr of 2.78 mg/dL (H)).   Medical History: Past Medical History:  Diagnosis Date  . Allergy   . Aortic atherosclerosis ([place])   . Arthritis   . CAD (coronary artery disease)   . COPD (chronic obstructive pulmonary disease) ([place])   . Depression   . Esophageal stricture   . Familial hematuria   . GERD (gastroesophageal reflux disease)   . Hyperlipidemia   . Hypertension   . IBS (irritable bowel syndrome)   . Nephrolithiasis   . NIDDM (non-insulin dependent diabetes mellitus)    with neuropathy  . Obesity, unspecified   . Pulmonary hypertension ([place])   . PVD (peripheral vascular disease) ([facility])     Medications:  Scheduled:  . aspirin  81 mg Oral Daily    Assessment: Patient admitted for weakness, SOB, hypotensive. Patient w/ a h/o pulmonary HTN concern for PE. No anticoagulation PTA. Being started on heparin drip  Goal of Therapy:  Heparin level 0.3-0.7 units/ml Monitor platelets by anticoagulation protocol: Yes   Plan:  Will bolus w/ heparin 5000 units IV x 1 Will start heparin rate at 1300 units/hr. Baseline labs drawn, baseline INR 2.88, aPTT 49. Patient appears to have some liver dysfunction w/ s/ coagulopathy. Will  check HL @ 0900 Will monitor daily CBC's and adjust per HL's  Tobie Lords, PharmD, BCPS Clinical Pharmacist 04/20/2018

## 2018-04-20 NOTE — ED Notes (Signed)
Attempted to call reports to CCU, instructed Nurse Practicio

## 2018-04-20 NOTE — Progress Notes (Signed)
ANTIBIOTIC CONSULT NOTE - INITIAL  Pharmacy Consult for Zosyn  Indication: bacteremia  Allergies  Allergen Reactions  . Sertraline Hcl Other (See Comments)    Cloudy thoughts, malaise    Patient Measurements: Height: _0  (154.9 cm) Weight: 165 lb (74.8 kg) IBW/kg (Calculated) : 47.8 Adjusted Body Weight:   Vital Signs: Temp: 97.8 F (36.6 C) (06/24 1942) Temp Source: Oral (06/24 1942) BP: 99/70 (06/24 1942) Pulse Rate: 87 (06/24 1942) Intake/Output from previous day: 06/23 0701 - 06/24 0700 In: 1514.3 [I.V.:264.3; IV Piggyback:1250] Out: -  Intake/Output from this shift: No intake/output data recorded.  Labs: Recent Labs    04/19/18 2214 04/20/18 0903  WBC 13.8* 13.8*  HGB 11.0* 9.7*  PLT 307 245  CREATININE 2.78* 2.41*   Estimated Creatinine Clearance: 21.8 mL/min (A) (by C-G formula based on SCr of 2.41 mg/dL (H)). No results for input(s): VANCOTROUGH, VANCOPEAK, VANCORANDOM, GENTTROUGH, GENTPEAK, GENTRANDOM, TOBRATROUGH, TOBRAPEAK, TOBRARND, AMIKACINPEAK, AMIKACINTROU, AMIKACIN in the last 72 hours.   Microbiology: Recent Results (from the past 720 hour(s))  Blood culture (routine x 2)     Status: None (Preliminary result)   Collection Time: 04/19/18 11:43 PM  Result Value Ref Range Status   Specimen Description BLOOD RIGHT ARM  Final   Special Requests   Final    BOTTLES DRAWN AEROBIC AND ANAEROBIC Blood Culture adequate volume   Culture  Setup Time   Final    Organism ID to follow GRAM POSITIVE RODS AEROBIC BOTTLE ONLY CRITICAL RESULT CALLED TO, READ BACK BY AND VERIFIED WITH: Laquiesha Piacente _1  04/20/18 AKT Performed at [facility], [address] [place], [place] [address]    Culture GRAM POSITIVE RODS  Final   Report Status PENDING  Incomplete  Blood culture (routine x 2)     Status: None (Preliminary result)   Collection Time: 04/19/18 11:43 PM  Result Value Ref Range Status   Specimen Description BLOOD RIGHT FOREARM  Final   Special Requests   Final    BOTTLES DRAWN AEROBIC AND ANAEROBIC Blood Culture results may not be optimal due to an excessive volume of blood received in culture bottles   Culture   Final    NO GROWTH < 12 HOURS Performed at [facility], [address] [place], [place] [address]    Report Status PENDING  Incomplete  Blood Culture ID Panel (Reflexed)     Status: None   Collection Time: 04/19/18 11:43 PM  Result Value Ref Range Status   Enterococcus species NOT DETECTED NOT DETECTED Final   Listeria monocytogenes NOT DETECTED NOT DETECTED Final   Staphylococcus species NOT DETECTED NOT DETECTED Final   Staphylococcus aureus NOT DETECTED NOT DETECTED Final   Streptococcus species NOT DETECTED NOT DETECTED Final   Streptococcus agalactiae NOT DETECTED NOT DETECTED Final   Streptococcus pneumoniae NOT DETECTED NOT DETECTED Final   Streptococcus pyogenes NOT DETECTED NOT DETECTED Final   Acinetobacter baumannii NOT DETECTED NOT DETECTED Final   Enterobacteriaceae species NOT DETECTED NOT DETECTED Final   Enterobacter cloacae complex NOT DETECTED NOT DETECTED Final   Escherichia coli NOT DETECTED NOT DETECTED Final   Klebsiella oxytoca NOT DETECTED NOT DETECTED Final   Klebsiella pneumoniae NOT DETECTED NOT DETECTED Final   Proteus species NOT DETECTED NOT DETECTED Final   Serratia marcescens NOT DETECTED NOT DETECTED Final   Haemophilus influenzae NOT DETECTED NOT DETECTED Final   Neisseria meningitidis NOT DETECTED NOT DETECTED Final   Pseudomonas aeruginosa NOT DETECTED NOT DETECTED Final  Candida albicans NOT DETECTED NOT DETECTED Final   Candida glabrata NOT DETECTED NOT DETECTED Final   Candida krusei NOT DETECTED NOT DETECTED Final   Candida parapsilosis NOT DETECTED NOT DETECTED Final   Candida tropicalis NOT DETECTED NOT DETECTED Final    Comment: Performed at [facility], [address] [place], [place] [address]    Medical History: Past Medical  History:  Diagnosis Date  . Allergy   . Aortic atherosclerosis ([place])   . Arthritis   . CAD (coronary artery disease)   . COPD (chronic obstructive pulmonary disease) ([place])   . Depression   . Esophageal stricture   . Familial hematuria   . GERD (gastroesophageal reflux disease)   . Hyperlipidemia   . Hypertension   . IBS (irritable bowel syndrome)   . Nephrolithiasis   . NIDDM (non-insulin dependent diabetes mellitus)    with neuropathy  . Obesity, unspecified   . Pulmonary hypertension ([place])   . PVD (peripheral vascular disease) ([facility])     Medications:  Medications Prior to Admission  Medication Sig Dispense Refill Last Dose  . aspirin 325 MG tablet Take 325 mg by mouth at bedtime.    04/18/2018 at Unknown time  . calcitRIOL (ROCALTROL) 0.5 MCG capsule Take 0.5 mcg by mouth daily.    04/18/2018 at Unknown time  . cholecalciferol (VITAMIN D) 1000 units tablet Take 1,000 Units by mouth at bedtime.    04/18/2018 at Unknown time  . enalapril (VASOTEC) 5 MG tablet Take 1 tablet (5 mg total) by mouth daily. 30 tablet 0 04/18/2018 at Unknown time  . gabapentin (NEURONTIN) 300 MG capsule Take 1 capsule by mouth in the morning, 1 capsule at noon, and 2 capsules at bedtime 360 capsule 2 04/18/2018 at Unknown time  . glipiZIDE (GLUCOTROL) 5 MG tablet Take 1 tablet by mouth twice a day before a meal 180 tablet 3 04/18/2018 at Unknown time  . Insulin Glargine (LANTUS) 100 UNIT/ML Solostar Pen Inject 32 Units into the skin daily. (Patient taking differently: Inject 30 Units into the skin daily. ) 15 mL 11 04/18/2018 at Unknown time  . Insulin Pen Needle (SURE COMFORT PEN NEEDLES) 31G X 5 MM MISC USE 1 PEN NEEDLE TO INJECT INSULIN 90 each 3 04/18/2018 at Unknown time  . loratadine (CLARITIN) 10 MG tablet Take 10 mg by mouth 2 (two) times daily.    04/18/2018 at Unknown time  . lovastatin (MEVACOR) 40 MG tablet Take 2 tablets (80 mg total) by mouth at bedtime. 180 tablet 3 04/18/2018 at Unknown time  .  methadone (DOLOPHINE) 10 MG tablet Take 2 tablets (20 mg total) by mouth 4 (four) times daily. 240 tablet 0 04/18/2018 at Unknown time  . metoprolol tartrate (LOPRESSOR) 25 MG tablet Take 0.5 tablets (12.5 mg total) by mouth 2 (two) times daily. 90 tablet 2 04/18/2018 at Unknown time  . mirtazapine (REMERON) 30 MG tablet Take 15 mg by mouth at bedtime.   04/18/2018 at Unknown time  . ONE TOUCH ULTRA TEST test strip USE 1 STRIP TO TEST BLOOD SUGAR ONCE DAILY 100 each 2 04/18/2018 at Unknown time  . pantoprazole (PROTONIX) 40 MG tablet Take 1 tablet by mouth twice a day 180 tablet 3 04/18/2018 at Unknown time  . sildenafil (REVATIO) 20 MG tablet Take 1 tablet (20 mg total) by mouth 3 (three) times daily. 90 tablet 3 04/18/2018 at Unknown time  . STIOLTO RESPIMAT 2.5-2.5 MCG/ACT AERS Inhale 2 puffs into the lungs daily. 4 g  5 04/18/2018 at Unknown time  . torsemide (DEMADEX) 20 MG tablet Take 2 tablets (40 mg total) by mouth daily. 180 tablet 2 04/18/2018 at Unknown time  . nitroGLYCERIN (NITROSTAT) 0.4 MG SL tablet Place 1 tablet (0.4 mg total) under the tongue every 5 (five) minutes as needed for chest pain. 100 tablet 1 prn at prn  . VENTOLIN HFA 108 (90 Base) MCG/ACT inhaler USE 2 PUFFS EVERY SIX HOURS AS NEEDED FOR WHEEZING OR SHORTNESS OF BREATH 18 g 3 prn at prn   Assessment: Unidentified GPR growing in 1 of 4 aerobic bottles CrCl = 21.8 ml/min   Goal of Therapy:  resolution of infection  Plan:  Expected duration 7 days with resolution of temperature and/or normalization of WBC   Will d/c ceftriaxone and start Zosyn 3.375 gm IV Q8H EI on 6/24 @ 22:30.   Mamye Bolds D 04/20/2018,10:30 PM

## 2018-04-20 NOTE — ED Notes (Signed)
CRITICAL LAB: TROPONIN is 0.57, ROBIN Lab, Dr. Archie Balboa AND WEBSTERnotified, orders received

## 2018-04-20 NOTE — ED Notes (Signed)
CRITICAL LAB: LACTIC is 3.8, PAULA Lab, Dr. Dahlia Client notified, orders received

## 2018-04-20 NOTE — Therapy (Signed)
Pt placed on 100% NRB for transport to Nuclear Medication. HFNC35% 45 LPM was resumed when the patient returned from Nuclear.

## 2018-04-20 NOTE — Consult Note (Signed)
[facility] Consultation      Assessment and Plan:  Acute on chronic hypoxic respiratory failure. Severe pulmonary hypertension. Hepatopathy, likely related to congestion from pulmonary hypertension.  - Continue to follow liver enzymes, rule out other causes of hepatitis, ultrasound of the liver to look for evidence of cirrhosis. - Wean down oxygen as tolerated - Patient may benefit from transfer to tertiary care center for further management of pulmonary hypertension, if her numbers are improving this can potentially be done outpatient.   Date: 04/20/2018  MRN# [id number] Lindsay Harmon 1956-07-19  Referring Physician: Dr. Anselm Jungling.  Lindsay Harmon is a 62 y.o. old female seen in consultation for chief complaint of:    Chief Complaint  Patient presents with  . Weakness  . Hypotension  . Shortness of Breath    HPI:   The patient is a 62 year old female previously seen by Dr. Curt Jews at [facility] in May 2019.  At that time it was noted that she had severe COPD, possible obstructive sleep apnea with pulmonary hypertension.  She had undergone right heart catheterization in 2016 which showed RV pressure of 97/16 with a mean wedge pressure of 14.  Repeat testing in March 2019 showed PA pressure of 92/33 with a mean pulmonary pressure of 57, elevated pulmonary vascular resistance at 7.2, cardiac output 5.9, wedge pressure 15.  She had been started on sildenafil previously, at her visit with Dr. Curt Jews the addition of Tyvaso was contemplated. She presented to the ED via EMS on 6/23 with nausea vomiting chest pain shortness of breath for the last 2 days.  Initial troponin was 0.56, BNP was elevated 1679, creatinine was 2.78, up from a recent baseline of 2 in January of this year.  INR was 2.88.  Lactic acid was 11.5 initially, however follow-up was 3.8.  Procalcitonin was negative at 0.26. AST and ALT were elevated at 1643, 646 respectively and have subsequently  increased to 2201, 966.  Alcohol and salicylates were negative, blood cultures negative thus far. Patient has been placed on 5 to 8 L since admission, she is now on high flow nasal cannula, flow rate of 35%.  Patient tell me she normally wears about 4 L at home at rest, and increases it to 5 L with activity.  December 2018 echocardiogram showed a normal LVEF RV was mildly dilated with an RVSP greater than 100  RHC 12/05/2014 RA 12 mmHg (mean) with O2 sat 72%; RV 97/16 mmHg with O2 sat 73%; PA 97/30 mmHg with O2 sat 67%; PCWP(mean) 14 mmHg (mean); Cardiac Output 5.78 L/min Right heart catheterization March 2019: PA pressure 92/33, mean 57, pulmonary vascular resistance 7.2, cardiac output  5.9, pulmonary capillary wedge pressure 15.  VQ scan 04/20/2018>> negative CT low-dose chest 04/13/2018>> diffuse bilateral emphysema, greatest in the apices, bibasilar atelectasis.  Pulmonary function testing: March 2019 simple spirometry ratio 67%, FEV1 1.21 L 53% predicted, FVC 1.79 L 61% predicted    PMHX:   Past Medical History:  Diagnosis Date  . Allergy   . Aortic atherosclerosis ([place])   . Arthritis   . CAD (coronary artery disease)   . COPD (chronic obstructive pulmonary disease) ([place])   . Depression   . Esophageal stricture   . Familial hematuria   . GERD (gastroesophageal reflux disease)   . Hyperlipidemia   . Hypertension   . IBS (irritable bowel syndrome)   . Nephrolithiasis   . NIDDM (non-insulin dependent diabetes mellitus)    with neuropathy  .  Obesity, unspecified   . Pulmonary hypertension ([place])   . PVD (peripheral vascular disease) ([place])    Surgical Hx:  Past Surgical History:  Procedure Laterality Date  . ABDOMINAL HYSTERECTOMY    . CARPAL TUNNEL RELEASE    . CESAREAN SECTION    . ERCP N/A 03/17/2017   Procedure: ENDOSCOPIC RETROGRADE CHOLANGIOPANCREATOGRAPHY (ERCP);  Surgeon: [name], MD;  Location: [facility] ENDOSCOPY;  Service: Endoscopy;  Laterality: N/A;  . EUS N/A  03/13/2017   Procedure: FULL UPPER ENDOSCOPIC ULTRASOUND (EUS) RADIAL;  Surgeon: Burbridge, Murray Hodgkins, MD;  Location: [facility] ENDOSCOPY;  Service: Endoscopy;  Laterality: N/A;  . RIGHT HEART CATH N/A 01/22/2018   Procedure: RIGHT HEART CATH;  Surgeon: Jolaine Artist, MD;  Location: [place] CV LAB;  Service: Cardiovascular;  Laterality: N/A;  . RIGHT HEART CATHETERIZATION N/A 12/05/2014   Procedure: RIGHT HEART CATH;  Surgeon: Sinclair Grooms, MD;  Location: [facility] CATH LAB;  Service: Cardiovascular;  Laterality: N/A;   Family Hx:  Family History  Problem Relation Age of Onset  . Diabetes Mother   . Cancer Mother        ovarian,melanoma  . Allergies Father   . Cancer Father        lung  . Cancer Maternal Grandmother        uterine  . Emphysema Paternal Grandfather   . Allergies Sister   . Breast cancer Neg Hx    Social Hx:   Social History   Tobacco Use  . Smoking status: Former Smoker    Packs/day: 1.50    Years: 20.00    Pack years: 30.00    Types: Cigarettes    Last attempt to quit: 01/04/2009    Years since quitting: 9.2  . Smokeless tobacco: Never Used  Substance Use Topics  . Alcohol use: No    Alcohol/week: 0.0 oz    Comment: heavy in the past  . Drug use: No   Medication:    Current Facility-Administered Medications:  .  0.9 %  sodium chloride infusion, , Intravenous, Continuous, Sridharan, Prasanna, MD, Last Rate: 75 mL/hr at 04/20/18 0254 .  aspirin chewable tablet 81 mg, 81 mg, Oral, Daily, Jodell Cipro, Prasanna, MD, 81 mg at 04/20/18 1013 .  azithromycin (ZITHROMAX) 500 mg in sodium chloride 0.9 % 250 mL IVPB, 500 mg, Intravenous, Q24H, Vaughan Basta, MD .  bisacodyl (DULCOLAX) EC tablet 5 mg, 5 mg, Oral, Daily PRN, Jodell Cipro, Prasanna, MD .  cefTRIAXone (ROCEPHIN) 1 g in sodium chloride 0.9 % 100 mL IVPB, 1 g, Intravenous, Q24H, Vaughan Basta, MD .  enalapril (VASOTEC) tablet 5 mg, 5 mg, Oral, Daily, Sridharan, Prasanna, MD, 5 mg at 04/20/18  1013 .  heparin ADULT infusion 100 units/mL (25000 units/273m sodium chloride 0.45%), 1,300 Units/hr, Intravenous, Continuous, Sridharan, Prasanna, MD, Last Rate: 13 mL/hr at 04/20/18 0304, 1,300 Units/hr at 04/20/18 0304 .  HYDROmorphone (DILAUDID) injection 0.5-1 mg, 0.5-1 mg, Intravenous, Q3H PRN, SJodell Cipro Prasanna, MD .  insulin aspart (novoLOG) injection 0-15 Units, 0-15 Units, Subcutaneous, TID WC, SArta Silence MD, 3 Units at 04/20/18 0756 .  ipratropium-albuterol (DUONEB) 0.5-2.5 (3) MG/3ML nebulizer solution 3 mL, 3 mL, Nebulization, Q4H PRN, SJodell Cipro Prasanna, MD .  metoprolol tartrate (LOPRESSOR) tablet 12.5 mg, 12.5 mg, Oral, BID, SJodell Cipro Prasanna, MD, 12.5 mg at 04/20/18 1013 .  nitroGLYCERIN (NITROSTAT) SL tablet 0.4 mg, 0.4 mg, Sublingual, Q5 min PRN, SJodell Cipro Prasanna, MD .  ondansetron (ZOFRAN) tablet 4 mg, 4 mg, Oral, Q6H PRN **OR** ondansetron (ZOFRAN)  injection 4 mg, 4 mg, Intravenous, Q6H PRN, Jodell Cipro, Prasanna, MD .  pantoprazole (PROTONIX) EC tablet 40 mg, 40 mg, Oral, BID, Jodell Cipro, Prasanna, MD, 40 mg at 04/20/18 1013 .  piperacillin-tazobactam (ZOSYN) IVPB 3.375 g, 3.375 g, Intravenous, Once, Ramond Dial, RPH, Last Rate: 12.5 mL/hr at 04/20/18 1013, 3.375 g at 04/20/18 1013 .  senna-docusate (Senokot-S) tablet 1 tablet, 1 tablet, Oral, QHS PRN, Jodell Cipro, Prasanna, MD .  sildenafil (REVATIO) tablet 20 mg, 20 mg, Oral, TID, Jodell Cipro, Prasanna, MD, 20 mg at 04/20/18 1014   Allergies:  Sertraline hcl  Review of Systems: Gen:  Denies  fever, sweats, chills HEENT: Denies blurred vision, double vision. bleeds, sore throat Cvc:  No dizziness, chest pain. Resp:   Denies cough or sputum production, Gi: Denies swallowing difficulty, stomach pain. Gu:  Denies bladder incontinence, burning urine Ext:   No Joint pain, stiffness. Skin: No skin rash,  hives  Endoc:  No polyuria, polydipsia. Psych: No depression, insomnia. Other:  All other systems  were reviewed with the patient and were negative other that what is mentioned in the HPI.   Physical Examination:   VS: BP 112/73 (BP Location: Left Arm)   Pulse (!) 102   Temp 98.4 F (36.9 C) (Oral)   Resp 12   Ht _0  (1.549 m)   Wt 165 lb (74.8 kg)   SpO2 (!) 87%   BMI 31.18 kg/m   General Appearance: No distress  Neuro:without focal findings,  speech normal,  HEENT: PERRLA, EOM intact.   Pulmonary: normal breath sounds, No wheezing.  CardiovascularNormal S1,S2.  No m/r/g.   Abdomen: Benign, Soft, non-tender. Renal:  No costovertebral tenderness  GU:  No performed at this time. Endoc: No evident thyromegaly, no signs of acromegaly. Skin:   warm, no rashes, no ecchymosis  Extremities: normal, no cyanosis, clubbing.  Other findings:    LABORATORY PANEL:   CBC Recent Labs  Lab 04/20/18 0903  WBC 13.8*  HGB 9.7*  HCT 32.5*  PLT 245   ------------------------------------------------------------------------------------------------------------------  Chemistries  Recent Labs  Lab 04/20/18 0347 04/20/18 0903  NA  --  133*  K  --  4.1  CL  --  96*  CO2  --  24  GLUCOSE  --  150*  BUN  --  46*  CREATININE  --  2.41*  CALCIUM  --  8.4*  MG 1.4*  --   AST  --  2,201*  ALT  --  966*  ALKPHOS  --  65  BILITOT  --  1.8*   ------------------------------------------------------------------------------------------------------------------  Cardiac Enzymes Recent Labs  Lab 04/20/18 0512  TROPONINI 1.44*   ------------------------------------------------------------  RADIOLOGY:  Dg Chest Portable 1 View  Result Date: 04/19/2018 CLINICAL DATA:  Acute onset shortness of breath. EXAM: PORTABLE CHEST 1 VIEW COMPARISON:  CT 04/13/2018 FINDINGS: Borderline cardiomegaly with aortic atherosclerosis. No aneurysm noted. Prominence in the region of the left atrial appendage is likely related to the patient's known dilatation of the main pulmonary artery likely  reflecting chronic pulmonary hypertension. Lungs are hyperinflated without pulmonary consolidation or effusion. No pneumothorax. No acute osseous abnormality. IMPRESSION: Borderline cardiomegaly with aortic atherosclerosis. Hyperinflated lungs with prominence main pulmonary artery likely reflecting chronic pulmonary hypertension and COPD. Electronically Signed   By: Ashley Royalty M.D.   On: 04/19/2018 23:02   US Liver Doppler  Result Date: 04/20/2018 CLINICAL DATA:  62 year old female with elevated liver enzymes. EXAM: ULTRASOUND ABDOMEN LIMITED RIGHT UPPER QUADRANT AND DOPPLER. COMPARISON:  Ultrasound  dated 01/28/2017 and CT dated 01/28/2017 FINDINGS: Gallbladder: There is a nonmobile and nonshadowing 6 mm echogenic focus within the gallbladder which may represent a polyp versus a nonshadowing small stone. The gallbladder wall is thickened and mildly edematous measuring 7 mm in thickness. No pericholecystic fluid. Negative sonographic Murphy's sign. Common bile duct: Diameter: 3 mm Liver: Mildly coarsened liver echotexture. The main portal vein is patent with hepatopetal flow. The portal vein diameter is 7 mm. The velocities in the portal vein are 34 cm/s, 34 cm/second, and 46 centimeter/second (proximally, mid, and distally respectively). The velocity within the right portal vein is 38 centimeter/second and in the left portal vein 29 centimeter/second. There is pulsatility of the portal vein which may be related to underlying liver disease or right heart dysfunction. The hepatic veins are patent as visualized with somewhat turbulent flow which may be related to underlying right heart dysfunction. The velocities in the right, middle, and left hepatic veins are 38, 29, and 29 centimeter/second respectively. The intrahepatic IVC is 2.2 cm in diameter. Evaluation of the IVC is limited as the patient is on non-rebreather. The pack artery is patent as visualized with velocity of 76 centimeter/second. The visualized  splenic vein appears patent with appropriate flow direction and velocity of 13 centimeter/second. The spleen measures 10.0 x 7.4 x 4.8 cm for a volume of 186 cc. No ascites. IMPRESSION: 1. Mildly coarsened liver echotexture. 2. A 6 mm gallbladder polyp versus a nonshadowing stone. Diffusely thickened gallbladder wall may be chronic or related to underlying liver disease. Clinical correlation is recommended. A hepatobiliary scintigraphy may provide better evaluation of the gallbladder if there is a high clinical concern for acute cholecystitis . 3. Patent main portal vein with hepatopetal flow. 4. No ascites. Electronically Signed   By: Anner Crete M.D.   On: 04/20/2018 03:33   US Abdomen Limited Ruq  Result Date: 04/20/2018 CLINICAL DATA:  62 year old female with elevated liver enzymes. EXAM: ULTRASOUND ABDOMEN LIMITED RIGHT UPPER QUADRANT AND DOPPLER. COMPARISON:  Ultrasound dated 01/28/2017 and CT dated 01/28/2017 FINDINGS: Gallbladder: There is a nonmobile and nonshadowing 6 mm echogenic focus within the gallbladder which may represent a polyp versus a nonshadowing small stone. The gallbladder wall is thickened and mildly edematous measuring 7 mm in thickness. No pericholecystic fluid. Negative sonographic Murphy's sign. Common bile duct: Diameter: 3 mm Liver: Mildly coarsened liver echotexture. The main portal vein is patent with hepatopetal flow. The portal vein diameter is 7 mm. The velocities in the portal vein are 34 cm/s, 34 cm/second, and 46 centimeter/second (proximally, mid, and distally respectively). The velocity within the right portal vein is 38 centimeter/second and in the left portal vein 29 centimeter/second. There is pulsatility of the portal vein which may be related to underlying liver disease or right heart dysfunction. The hepatic veins are patent as visualized with somewhat turbulent flow which may be related to underlying right heart dysfunction. The velocities in the right,  middle, and left hepatic veins are 38, 29, and 29 centimeter/second respectively. The intrahepatic IVC is 2.2 cm in diameter. Evaluation of the IVC is limited as the patient is on non-rebreather. The pack artery is patent as visualized with velocity of 76 centimeter/second. The visualized splenic vein appears patent with appropriate flow direction and velocity of 13 centimeter/second. The spleen measures 10.0 x 7.4 x 4.8 cm for a volume of 186 cc. No ascites. IMPRESSION: 1. Mildly coarsened liver echotexture. 2. A 6 mm gallbladder polyp versus a nonshadowing stone. Diffusely thickened  gallbladder wall may be chronic or related to underlying liver disease. Clinical correlation is recommended. A hepatobiliary scintigraphy may provide better evaluation of the gallbladder if there is a high clinical concern for acute cholecystitis . 3. Patent main portal vein with hepatopetal flow. 4. No ascites. Electronically Signed   By: Anner Crete M.D.   On: 04/20/2018 03:33       Thank  you for the consultation and for allowing [facility] to assist in the care of your patient. Our recommendations are noted above.  Please contact us if we can be of further service.   Marda Stalker, M.D., F.C.C.P.  Board Certified in Internal Medicine, Pulmonary Medicine, [place], and Sleep Medicine.  [facility] Office Number: [phone]   04/20/2018

## 2018-04-20 NOTE — ED Notes (Addendum)
Ultrasound complete.  Patient taken off non-rebreather mask and placed on nasal cannula at 6 liters in attempt to wean.

## 2018-04-20 NOTE — Care Management (Addendum)
Admitted from home with shortness of breath.  Has  COPD and pulmonary hypertension.  Chronic home oxygen.  Patient currently on HFNC and unable to converse with CM.  Sent for [facility] referral consideration

## 2018-04-20 NOTE — Progress Notes (Signed)
ANTICOAGULATION CONSULT NOTE - Initial Consult  Pharmacy Consult for heparin Indication: pulmonary embolus  Allergies  Allergen Reactions  . Sertraline Hcl Other (See Comments)    Cloudy thoughts, malaise    Patient Measurements: Height: 5' 1" (154.9 cm) Weight: 165 lb (74.8 kg) IBW/kg (Calculated) : 47.8 Heparin Dosing Weight: 64   Vital Signs: Temp: 98.4 F (36.9 C) (06/24 0751) Temp Source: Oral (06/24 0751) BP: 112/73 (06/24 0751) Pulse Rate: 102 (06/24 0751)  Labs: Recent Labs    04/19/18 2214 04/19/18 2220 04/20/18 0512 04/20/18 0903  HGB 11.0*  --   --  9.7*  HCT 38.8  --   --  32.5*  PLT 307  --   --  245  APTT  --  49*  --  >160*  LABPROT 29.9*  --   --  37.2*  INR 2.88  --   --  3.80  HEPARINUNFRC  --   --   --  0.42  CREATININE 2.78*  --   --  2.41*  TROPONINI 0.56*  --  1.44*  --     Estimated Creatinine Clearance: 21.8 mL/min (A) (by C-G formula based on SCr of 2.41 mg/dL (H)).   Medical History: Past Medical History:  Diagnosis Date  . Allergy   . Aortic atherosclerosis ([place])   . Arthritis   . CAD (coronary artery disease)   . COPD (chronic obstructive pulmonary disease) ([place])   . Depression   . Esophageal stricture   . Familial hematuria   . GERD (gastroesophageal reflux disease)   . Hyperlipidemia   . Hypertension   . IBS (irritable bowel syndrome)   . Nephrolithiasis   . NIDDM (non-insulin dependent diabetes mellitus)    with neuropathy  . Obesity, unspecified   . Pulmonary hypertension ([place])   . PVD (peripheral vascular disease) ([facility])     Medications:  Scheduled:  . aspirin  81 mg Oral Daily  . enalapril  5 mg Oral Daily  . insulin aspart  0-15 Units Subcutaneous TID WC  . metoprolol tartrate  12.5 mg Oral BID  . pantoprazole  40 mg Oral BID  . sildenafil  20 mg Oral TID    Assessment: Patient admitted for weakness, SOB, hypotensive. Patient w/ a h/o pulmonary HTN concern for PE. No anticoagulation PTA. Being started  on heparin drip  Goal of Therapy:  Heparin level 0.3-0.7 units/ml Monitor platelets by anticoagulation protocol: Yes   Plan:  Heparin level is therapeutic, however INR continues to increase to 3.8 and APTT >160 due to hepatic issues/coagulopathy. Pt getting VQ scan now. Discussed with Dr. Anselm Jungling, we will turn the drip off for now, consult cardiology, pulmonology. Follow up VQ scan. RN to go down and turn drip off.  Ramond Dial, Pharm.D, BCPS Clinical Pharmacist  04/20/2018

## 2018-04-20 NOTE — ED Notes (Signed)
O2 sat 95% on 6 liters, lowered to 4 liters.

## 2018-04-20 NOTE — H&P (Addendum)
Lindsay Harmon NAME: Lindsay Harmon    MR#:  [id number]  DATE OF BIRTH:  1956/04/19  DATE OF ADMISSION:  04/19/2018  PRIMARY CARE PHYSICIAN: Venia Carbon, MD   REQUESTING/REFERRING PHYSICIAN: Nance Pear, MD  CHIEF COMPLAINT:   Chief Complaint  Patient presents with  . Weakness  . Hypotension  . Shortness of Breath    HISTORY OF PRESENT ILLNESS:  Lindsay Harmon  is a 62 y.o. female with a known history of severe PHTN (PASP 92 on 01/22/2018 R heart cath; on Revatio x28mo, on 4L [place] chronic O2, p/w fatigue/malaise/lethargy/generalized weakness, N/V, CP/SOB x1-2d. Pt staets that she ate dinner @~1600-1700PM on Saturday 04/18/2018. She ate pork chops, a baked potato and a dinner roll. She states she started feeling fatigue/generalized weakness (feeling "run down") shortly thereafter. She noted mild abdominal cramping, but states her symptoms were mild, and she went to bed. She woke on Sunday 06/23 morning feeling nauseous. She states she did not eat breakfast or take any of her medications. She states she had transient midchest tightness at approximately midday, but she states it only lasted ~1hr, and was very mild in severity. It resolved spontaneously. She ate several saltines in the early afternoon, but did not eat anything else for the rest of the day. She states she had nausea on and off all day, as well as dry heaving. She only vomited once, a small quantity of clear liquid, (-) blood/bile. She endorses dehydration. She states her nausea later improved, but she felt very lethargic. @~2130PM, her son arrived, and states he found her to have labored breathing, w/ SpO2 72% on 4L [place]. EMS was called. Her son states she was pale, cyanotic, hypotensive and hypoglycemic to EMS. She has since been stabilized, and is on non-rebreather mask at the time of my assessment. She answers my questions w/o difficulty, and does not exhibit conversational  dyspnea. She is comfortable and well-appearing, in no acute distress. She AAOx3, and does not appear septic or toxic. Her lungs are clear. She states she is already feeling better.  Pt has been on Revatio x160moShe has not taken any medications all day Sunday (06/23). She denies EtOH or illicit drug use. She denies recent travel or sick contacts. She denies Hx of blood transfusion. She takes Tylenol only very occasionally. She is not on systemic anticoagulation. She does not take Metformin. She does not take any herbal or OTC medications. She denies any changes in diet.  PAST MEDICAL HISTORY:   Past Medical History:  Diagnosis Date  . Allergy   . Aortic atherosclerosis ([facility]  . Arthritis   . CAD (coronary artery disease)   . COPD (chronic obstructive pulmonary disease) ([facility]  . Depression   . Esophageal stricture   . Familial hematuria   . GERD (gastroesophageal reflux disease)   . Hyperlipidemia   . Hypertension   . IBS (irritable bowel syndrome)   . Nephrolithiasis   . NIDDM (non-insulin dependent diabetes mellitus)    with neuropathy  . Obesity, unspecified   . Pulmonary hypertension ([facility]  . PVD (peripheral vascular disease) (HCL[facility]    PAST SURGICAL HISTORY:   Past Surgical History:  Procedure Laterality Date  . ABDOMINAL HYSTERECTOMY    . CARPAL TUNNEL RELEASE    . CESAREAN SECTION    . ERCP N/A 03/17/2017   Procedure: ENDOSCOPIC RETROGRADE CHOLANGIOPANCREATOGRAPHY (ERCP);  Surgeon: WoLucilla LameMD;  [facility];  Service: Endoscopy;  Laterality: N/A;  . EUS N/A 03/13/2017   Procedure: FULL UPPER ENDOSCOPIC ULTRASOUND (EUS) RADIAL;  Surgeon: Burbridge, Murray Hodgkins, MD;  Location: [facility] ENDOSCOPY;  Service: Endoscopy;  Laterality: N/A;  . RIGHT HEART CATH N/A 01/22/2018   Procedure: RIGHT HEART CATH;  Surgeon: Jolaine Artist, MD;  Location: [facility];  Service: Cardiovascular;  Laterality: N/A;  . RIGHT HEART CATHETERIZATION N/A 12/05/2014    Procedure: RIGHT HEART CATH;  Surgeon: Sinclair Grooms, MD;  Location: [facility] CATH LAB;  Service: Cardiovascular;  Laterality: N/A;    SOCIAL HISTORY:   Social History   Tobacco Use  . Smoking status: Former Smoker    Packs/day: 1.50    Years: 20.00    Pack years: 30.00    Types: Cigarettes    Last attempt to quit: 01/04/2009    Years since quitting: 9.2  . Smokeless tobacco: Never Used  Substance Use Topics  . Alcohol use: No    Alcohol/week: 0.0 oz    Comment: heavy in the past    FAMILY HISTORY:   Family History  Problem Relation Age of Onset  . Diabetes Mother   . Cancer Mother        ovarian,melanoma  . Allergies Father   . Cancer Father        lung  . Cancer Maternal Grandmother        uterine  . Emphysema Paternal Grandfather   . Allergies Sister   . Breast cancer Neg Hx     DRUG ALLERGIES:   Allergies  Allergen Reactions  . Sertraline Hcl Other (See Comments)    Cloudy thoughts, malaise    REVIEW OF SYSTEMS:   Review of Systems  Constitutional: Positive for malaise/fatigue. Negative for chills, diaphoresis, fever and weight loss.  HENT: Negative for congestion, ear pain, hearing loss, nosebleeds, sinus pain, sore throat and tinnitus.   Eyes: Negative for blurred vision, double vision and photophobia.  Respiratory: Positive for shortness of breath. Negative for cough, hemoptysis, sputum production and wheezing.   Cardiovascular: Positive for chest pain. Negative for palpitations, orthopnea, claudication, leg swelling and PND.  Gastrointestinal: Positive for abdominal pain (mild abdominal cramping 06/22 per HPI), nausea and vomiting. Negative for blood in stool, constipation, diarrhea, heartburn and melena.  Genitourinary: Negative for dysuria, frequency, hematuria and urgency.  Musculoskeletal: Negative for back pain, joint pain, myalgias and neck pain.  Skin: Negative for itching and rash.  Neurological: Positive for weakness. Negative for dizziness,  tingling, tremors, sensory change, speech change, focal weakness, seizures, loss of consciousness and headaches.  Psychiatric/Behavioral: Negative for memory loss. The patient does not have insomnia.    MEDICATIONS AT HOME:   Prior to Admission medications   Medication Sig Start Date End Date Taking? Authorizing Provider  aspirin 325 MG tablet Take 325 mg by mouth at bedtime.    Yes [provider]  calcitRIOL (ROCALTROL) 0.5 MCG capsule Take 0.5 mcg by mouth daily.  02/03/17  Yes [provider]  cholecalciferol (VITAMIN D) 1000 units tablet Take 1,000 Units by mouth at bedtime.    Yes [provider]  enalapril (VASOTEC) 5 MG tablet Take 1 tablet (5 mg total) by mouth daily. 01/29/17  Yes Fritzi Mandes, MD  gabapentin (NEURONTIN) 300 MG capsule Take 1 capsule by mouth in the morning, 1 capsule at noon, and 2 capsules at bedtime 09/15/17  Yes Viviana Simpler I, MD  glipiZIDE (GLUCOTROL) 5 MG tablet Take 1 tablet by mouth  twice a day before a meal 03/09/18  Yes Venia Carbon, MD  Insulin Glargine (LANTUS) 100 UNIT/ML Solostar Pen Inject 32 Units into the skin daily. Patient taking differently: Inject 30 Units into the skin daily.  07/23/17  Yes Viviana Simpler I, MD  Insulin Pen Needle (SURE COMFORT PEN NEEDLES) 31G X 5 MM MISC USE 1 PEN NEEDLE TO INJECT INSULIN 07/23/17  Yes Venia Carbon, MD  loratadine (CLARITIN) 10 MG tablet Take 10 mg by mouth 2 (two) times daily.    Yes [provider]  lovastatin (MEVACOR) 40 MG tablet Take 2 tablets (80 mg total) by mouth at bedtime. 12/05/17  Yes Venia Carbon, MD  methadone (DOLOPHINE) 10 MG tablet Take 2 tablets (20 mg total) by mouth 4 (four) times daily. 04/17/18  Yes Venia Carbon, MD  metoprolol tartrate (LOPRESSOR) 25 MG tablet Take 0.5 tablets (12.5 mg total) by mouth 2 (two) times daily. 07/01/17  Yes Venia Carbon, MD  mirtazapine (REMERON) 30 MG tablet Take 15 mg by mouth at bedtime.   Yes [provider]  ONE TOUCH ULTRA TEST test strip USE 1 STRIP TO TEST BLOOD SUGAR ONCE DAILY 07/21/17  Yes Venia Carbon, MD  pantoprazole (PROTONIX) 40 MG tablet Take 1 tablet by mouth twice a day 10/03/17  Yes Venia Carbon, MD  sildenafil (REVATIO) 20 MG tablet Take 1 tablet (20 mg total) by mouth 3 (three) times daily. 02/19/18  Yes Bensimhon, Shaune Pascal, MD  STIOLTO RESPIMAT 2.5-2.5 MCG/ACT AERS Inhale 2 puffs into the lungs daily. 11/03/17  Yes Juanito Doom, MD  torsemide (DEMADEX) 20 MG tablet Take 2 tablets (40 mg total) by mouth daily. 01/07/18  Yes Bensimhon, Shaune Pascal, MD  nitroGLYCERIN (NITROSTAT) 0.4 MG SL tablet Place 1 tablet (0.4 mg total) under the tongue every 5 (five) minutes as needed for chest pain. 07/23/16   Venia Carbon, MD  VENTOLIN HFA 108 (90 Base) MCG/ACT inhaler USE 2 PUFFS EVERY SIX HOURS AS NEEDED FOR WHEEZING OR SHORTNESS OF BREATH 08/28/17   Juanito Doom, MD      VITAL SIGNS:  Blood pressure 113/71, pulse (!) 103, temperature 98 F (36.7 C), temperature source Oral, resp. rate 15, height _0  (1.549 m), weight 74.8 kg (165 lb), SpO2 99 %.  PHYSICAL EXAMINATION:  Physical Exam  Constitutional: She is oriented to person, place, and time. She appears well-developed and well-nourished. She is active and cooperative.  Non-toxic appearance. She does not have a sickly appearance. She does not appear ill. No distress. She is not intubated. Face mask in place.  HENT:  Head: Normocephalic and atraumatic.  Mouth/Throat: Oropharynx is clear and moist. No oropharyngeal exudate.  Eyes: Conjunctivae, EOM and lids are normal. No scleral icterus.  Neck: Neck supple. No thyromegaly present.  Cardiovascular: Regular rhythm, S1 normal and S2 normal.  No extrasystoles are present. Tachycardia present. Exam reveals gallop and S4. Exam reveals no S3, no distant heart sounds and no friction rub.  Murmur heard. Pulmonary/Chest: Effort normal. No accessory muscle usage or  stridor. Tachypnea noted. No apnea and no bradypnea. She is not intubated. No respiratory distress. She has decreased breath sounds in the right upper field, the right middle field, the right lower field, the left upper field, the left middle field and the left lower field. She has no wheezes. She has no rhonchi. She has no rales.  Abdominal: Soft. Bowel sounds are normal. She exhibits no distension. There  is no tenderness. There is no rebound and no guarding.  Musculoskeletal: Normal range of motion. She exhibits no edema or tenderness.       Right lower leg: Normal. She exhibits no tenderness and no edema.       Left lower leg: Normal. She exhibits no tenderness and no edema.  Lymphadenopathy:    She has no cervical adenopathy.  Neurological: She is alert and oriented to person, place, and time. She is not disoriented.  Skin: Skin is warm, dry and intact. No rash noted. She is not diaphoretic. No erythema.  Psychiatric: She has a normal mood and affect. Her speech is normal and behavior is normal. Judgment and thought content normal. Her mood appears not anxious. She is not agitated. Cognition and memory are normal.   LABORATORY PANEL:   CBC Recent Labs  Lab 04/19/18 2214  WBC 13.8*  HGB 11.0*  HCT 38.8  PLT 307   ------------------------------------------------------------------------------------------------------------------  Chemistries  Recent Labs  Lab 04/19/18 2214  NA 139  K 4.5  CL 95*  CO2 19*  GLUCOSE 82  BUN 42*  CREATININE 2.78*  CALCIUM 9.4  AST 1,643*  ALT 646*  ALKPHOS 80  BILITOT 2.6*   ------------------------------------------------------------------------------------------------------------------  Cardiac Enzymes Recent Labs  Lab 04/19/18 2214  TROPONINI 0.56*   ------------------------------------------------------------------------------------------------------------------  RADIOLOGY:  Dg Chest Portable 1 View  Result Date:  04/19/2018 CLINICAL DATA:  Acute onset shortness of breath. EXAM: PORTABLE CHEST 1 VIEW COMPARISON:  CT 04/13/2018 FINDINGS: Borderline cardiomegaly with aortic atherosclerosis. No aneurysm noted. Prominence in the region of the left atrial appendage is likely related to the patient's known dilatation of the main pulmonary artery likely reflecting chronic pulmonary hypertension. Lungs are hyperinflated without pulmonary consolidation or effusion. No pneumothorax. No acute osseous abnormality. IMPRESSION: Borderline cardiomegaly with aortic atherosclerosis. Hyperinflated lungs with prominence main pulmonary artery likely reflecting chronic pulmonary hypertension and COPD. Electronically Signed   By: Ashley Royalty M.D.   On: 04/19/2018 23:02   IMPRESSION AND PLAN:   A/P: 80F p/w fatigue/malaise/lethargy/generalized weakness, N/V, CP/SOB, found to be hypotensive and hypoxic by EMS. Acute on chronic hypoxemic respiratory failure, etiology unclear, concern for PE vs. ACS. Lungs clear, not clinically in CHF. Labwork demonstrates renal failure (CKD IV, Cr at baseline), as well as transaminasemia, mild hyperbilirubinemia and coagulopathy w/ elevated INR, suggesting liver failure. Trop-I elevated to 0.56, BNP 1679. Lactate elevated to 11.5 (decreased to 3.8 s/p IVF). Leukocytosis, microcytic anemia. -Pt continues to have elevated oxygen demand, on non-rebreather, difficulty weaning; would favor close monitoring on Stepdown at present time -Continuous cardiac monitoring, pulse ox -Tachycardia, tachypnea, hypoxia, leukocytosis, SIRS (+) -Afebrile, does not appear septic/toxic -Lactate 11.5 on admission, decreased to 3.8 w/ IVF -No source of infxn identified -Started on Vancomycin + Zosyn in ED -BCx x2, U/A pending -Procalcitonin pending -Infection lower on my differential, suspect lactate elevation 2/2 liver dysfxn -Pt w/ transient CP on Sun 06/23, concern for PE vs. ACS -Hypotension improved, however pt  persistently tachycardic -Trop-I 0.56 -BNP elevated, however likely nondiagnostic in setting of severe pulmonary HTN, pt not clinically in CHF -EF 55-60% as of 09/30/2017 Echo; 01/22/2018 R heart cath demonstrated severe PAH with normal L-sided pressures and cardiac output -Case d/w radiologist on call (Dr. Quintella Reichert, [facility]), Cr 2.78 (at baseline), CKD IV, high risk of contrast nephropathy. Will hold off on CTA chest. Ordered for V/Q scan in AM -[id number] -Heparin gtt -Echo pending -Holding all medications for now (hypotension, hypoglycemia, renal failure, liver  failure), will restart beta blocker and ACE-I as soon as possible. Hold Statin (2/2 transaminasemia) -Cr 2.78 on admission, at baseline, not in AKI. Known CKD IV, stable x25yr per pt -K+ WNL, Mag + Phos pending -Dehydrated, s/p IVF in ED -AST 1643, ALT 646, APhos WNL, TProt WNL, Albumin WNL, TBili 2.6, Lactate 11.5 (improved to 3.8), INR 2.88, APTT 49, Plt WNL. Findings suggest liver failure, suspected acute on chronic. Albumin/protein and Plt unaffected. Pattern more suggestive of hepatocellular injury, does not appear hepatobiliary/cholestatic. 01/28/2017 U/S abdomen states: "Liver demonstrates increased echogenicity and surface nodularity of the left hepatic lobe compatible with fatty infiltration and developing early cirrhosis."  May be due to pulmonary HTN. (-) EtOH, (-) drug use, (-) Tylenol. (-) Hx blood txfn. Will check toxicology, EtOH/Tylenol levels, HIV, hepatitis panel nonetheless. Liver U/S + doppler pending. Will avoid CT imaging for now due to severity of renal disease -Hgb 11.0, MCV 67.8. Suspect iron deficiency anemia. Iron studies pending. No evidence of acute blood loss -Diabetic, hypoglycemic per EMS. Glucose 82 on admission, holding Lantus and PO antihyperglycemics. SSI -DuoNebs PRN -Gentle maintenance IVF -Resume Revatio if BP stable -NPO, advance diet as tolerated (cardiac diabetic diet) -Heparin gtt,  SCDs -Full code -Admission, > 2 midnights  Addendum: Pt is stable enough to be admitted to Telemetry unit w/ cardiac monitoring + pulse oximetry. Will defer disposition to charge nurse.   All the records are reviewed and case discussed with ED provider. Management plans discussed with the patient, family and they are in agreement.  CODE STATUS: Full code  TOTAL TIME TAKING CARE OF THIS PATIENT: 120 minutes.    PArta SilenceM.D on 04/20/2018 at 2:21 AM  Between 7am to 6pm - Pager - [phone] After 6pm go to [email or web address] - pProofreader [facility]  Office  [phone] CC: Primary care physician; LVenia Carbon MD   Note: This dictation was prepared with Dragon dictation along with smaller phrase technology. Any transcriptional errors that result from this process are unintentional.

## 2018-04-20 NOTE — Progress Notes (Signed)
PHARMACY - PHYSICIAN COMMUNICATION CRITICAL VALUE ALERT - BLOOD CULTURE IDENTIFICATION (BCID)  Results for orders placed or performed during the hospital encounter of 04/19/18  Blood Culture ID Panel (Reflexed) (Collected: 04/19/2018 11:43 PM)  Result Value Ref Range   Enterococcus species NOT DETECTED NOT DETECTED   Listeria monocytogenes NOT DETECTED NOT DETECTED   Staphylococcus species NOT DETECTED NOT DETECTED   Staphylococcus aureus NOT DETECTED NOT DETECTED   Streptococcus species NOT DETECTED NOT DETECTED   Streptococcus agalactiae NOT DETECTED NOT DETECTED   Streptococcus pneumoniae NOT DETECTED NOT DETECTED   Streptococcus pyogenes NOT DETECTED NOT DETECTED   Acinetobacter baumannii NOT DETECTED NOT DETECTED   Enterobacteriaceae species NOT DETECTED NOT DETECTED   Enterobacter cloacae complex NOT DETECTED NOT DETECTED   Escherichia coli NOT DETECTED NOT DETECTED   Klebsiella oxytoca NOT DETECTED NOT DETECTED   Klebsiella pneumoniae NOT DETECTED NOT DETECTED   Proteus species NOT DETECTED NOT DETECTED   Serratia marcescens NOT DETECTED NOT DETECTED   Haemophilus influenzae NOT DETECTED NOT DETECTED   Neisseria meningitidis NOT DETECTED NOT DETECTED   Pseudomonas aeruginosa NOT DETECTED NOT DETECTED   Candida albicans NOT DETECTED NOT DETECTED   Candida glabrata NOT DETECTED NOT DETECTED   Candida krusei NOT DETECTED NOT DETECTED   Candida parapsilosis NOT DETECTED NOT DETECTED   Candida tropicalis NOT DETECTED NOT DETECTED    Name of physician (or Provider) Contacted:  [place]   Changes to prescribed antibiotics required:  GPR growing in 1 of 4 Aerobic bottles ,  BCID did not detect anything  Will D/C ceftriaxone and start Zosyn 3.375 gm IV Q8H EI.   Charae Depaolis D 04/20/2018  10:26 PM

## 2018-04-20 NOTE — ED Notes (Addendum)
Attempted call reports to CCU, instructed Nurse Practioner wants to speak to Prime MD.  Addendum: Case d/w ICU NP. Pt clinically stable to go to Telemetry w/ cardiac monitoring and pulse oximetry, however this does not factor in pt's nursing and logistical needs. Will defer decision to charge nurse. -Coy Saunas (Nocturnist)  Addendum: Case d/w Ena Dawley (ED RN). Will admit to Telemetry on Hi-Flow 35%. Low threshold for xfer to Stepdown. Admit order revised. -P. Dwayn Moravek (Nocturnist)

## 2018-04-20 NOTE — ED Notes (Signed)
Patient pulse oxi 86% on O2 at 4 liters, spoke with MD and patient back up to 5 liters and instructed to continue admission to step-down unit

## 2018-04-20 NOTE — Progress Notes (Signed)
[facility] [place] NAME: Lindsay Harmon    MR#:  [id number]  DATE OF BIRTH:  1956/04/14  SUBJECTIVE:  CHIEF COMPLAINT:   Chief Complaint  Patient presents with  . Weakness  . Hypotension  . Shortness of Breath   Hx of COPD, Pulm Htn, on sildenafil- came with worsening SOB for last few days. Noted elevated Troponin, No chest pain.  REVIEW OF SYSTEMS:  CONSTITUTIONAL: No fever, fatigue or weakness.  EYES: No blurred or double vision.  EARS, NOSE, AND THROAT: No tinnitus or ear pain.  RESPIRATORY: have cough, shortness of breath,no wheezing or hemoptysis.  CARDIOVASCULAR: No chest pain, orthopnea, edema.  GASTROINTESTINAL: No nausea, vomiting, diarrhea or abdominal pain.  GENITOURINARY: No dysuria, hematuria.  ENDOCRINE: No polyuria, nocturia,  HEMATOLOGY: No anemia, easy bruising or bleeding SKIN: No rash or lesion. MUSCULOSKELETAL: No joint pain or arthritis.   NEUROLOGIC: No tingling, numbness, weakness.  PSYCHIATRY: No anxiety or depression.   ROS  DRUG ALLERGIES:   Allergies  Allergen Reactions  . Sertraline Hcl Other (See Comments)    Cloudy thoughts, malaise    VITALS:  Blood pressure 103/71, pulse 89, temperature 98.4 F (36.9 C), temperature source Oral, resp. rate 12, height _0  (1.549 m), weight 74.8 kg (165 lb), SpO2 97 %.  PHYSICAL EXAMINATION:  GENERAL:  62 y.o.-year-old patient lying in the bed with no acute distress.  EYES: Pupils equal, round, reactive to light and accommodation. No scleral icterus. Extraocular muscles intact.  HEENT: Head atraumatic, normocephalic. Oropharynx and nasopharynx clear.  NECK:  Supple, no jugular venous distention. No thyroid enlargement, no tenderness.  LUNGS: Normal breath sounds bilaterally, no wheezing, some crepitation. No use of accessory muscles of respiration. On HFNC oxygen. CARDIOVASCULAR: S1, S2 normal. No murmurs, rubs, or gallops.  ABDOMEN: Soft, nontender,  nondistended. Bowel sounds present. No organomegaly or mass.  EXTREMITIES: No pedal edema, cyanosis, or clubbing.  NEUROLOGIC: Cranial nerves II through XII are intact. Muscle strength 4/5 in all extremities. Sensation intact. Gait not checked.  PSYCHIATRIC: The patient is alert and oriented x 3.  SKIN: No obvious rash, lesion, or ulcer.   Physical Exam LABORATORY PANEL:   CBC Recent Labs  Lab 04/20/18 0903  WBC 13.8*  HGB 9.7*  HCT 32.5*  PLT 245   ------------------------------------------------------------------------------------------------------------------  Chemistries  Recent Labs  Lab 04/20/18 0347 04/20/18 0903  NA  --  133*  K  --  4.1  CL  --  96*  CO2  --  24  GLUCOSE  --  150*  BUN  --  46*  CREATININE  --  2.41*  CALCIUM  --  8.4*  MG 1.4*  --   AST  --  2,201*  ALT  --  966*  ALKPHOS  --  65  BILITOT  --  1.8*   ------------------------------------------------------------------------------------------------------------------  Cardiac Enzymes Recent Labs  Lab 04/20/18 0512 04/20/18 1141  TROPONINI 1.44* 1.79*   ------------------------------------------------------------------------------------------------------------------  RADIOLOGY:  Dg Abd 1 View  Result Date: 04/20/2018 CLINICAL DATA:  Distended urinary bladder. EXAM: ABDOMEN - 1 VIEW COMPARISON:  CT scan of January 28, 2017. FINDINGS: The bowel gas pattern is normal. No radio-opaque calculi or other significant radiographic abnormality are seen. IMPRESSION: No definite evidence of bowel obstruction or ileus. Electronically Signed   By: Marijo Conception, M.D.   On: 04/20/2018 17:06   Nm Pulmonary Perf And Vent  Result Date: 04/20/2018 CLINICAL DATA:  COPD. Short of breath.  Concern for pulmonary embolism. High pretest probability EXAM: NUCLEAR MEDICINE VENTILATION - PERFUSION LUNG SCAN TECHNIQUE: Ventilation images were obtained in multiple projections using inhaled aerosol Tc-39mDTPA. Perfusion  images were obtained in multiple projections after intravenous injection of Tc-985mAA. RADIOPHARMACEUTICALS:  32.9 mCi of Tc-9956mPA aerosol inhalation and 4.36 mCi Tc99m33m IV COMPARISON:  6 chest radiograph 04/19/2018 FINDINGS: Ventilation: No focal ventilation defect. Perfusion: No wedge shaped peripheral perfusion defects to suggest acute pulmonary embolism. IMPRESSION: No evidence acute pulmonary embolism. Electronically Signed   By: StewSuzy Bouchard.   On: 04/20/2018 12:27   Dg Chest Portable 1 View  Result Date: 04/19/2018 CLINICAL DATA:  Acute onset shortness of breath. EXAM: PORTABLE CHEST 1 VIEW COMPARISON:  CT 04/13/2018 FINDINGS: Borderline cardiomegaly with aortic atherosclerosis. No aneurysm noted. Prominence in the region of the left atrial appendage is likely related to the patient's known dilatation of the main pulmonary artery likely reflecting chronic pulmonary hypertension. Lungs are hyperinflated without pulmonary consolidation or effusion. No pneumothorax. No acute osseous abnormality. IMPRESSION: Borderline cardiomegaly with aortic atherosclerosis. Hyperinflated lungs with prominence main pulmonary artery likely reflecting chronic pulmonary hypertension and COPD. Electronically Signed   By: DaviAshley Royalty.   On: 04/19/2018 23:02   Us L[place]er Doppler  Result Date: 04/20/2018 CLINICAL DATA:  62 y46r old female with elevated liver enzymes. EXAM: ULTRASOUND ABDOMEN LIMITED RIGHT UPPER QUADRANT AND DOPPLER. COMPARISON:  Ultrasound dated 01/28/2017 and CT dated 01/28/2017 FINDINGS: Gallbladder: There is a nonmobile and nonshadowing 6 mm echogenic focus within the gallbladder which may represent a polyp versus a nonshadowing small stone. The gallbladder wall is thickened and mildly edematous measuring 7 mm in thickness. No pericholecystic fluid. Negative sonographic Murphy's sign. Common bile duct: Diameter: 3 mm Liver: Mildly coarsened liver echotexture. The main portal vein is patent  with hepatopetal flow. The portal vein diameter is 7 mm. The velocities in the portal vein are 34 cm/s, 34 cm/second, and 46 centimeter/second (proximally, mid, and distally respectively). The velocity within the right portal vein is 38 centimeter/second and in the left portal vein 29 centimeter/second. There is pulsatility of the portal vein which may be related to underlying liver disease or right heart dysfunction. The hepatic veins are patent as visualized with somewhat turbulent flow which may be related to underlying right heart dysfunction. The velocities in the right, middle, and left hepatic veins are 38, 29, and 29 centimeter/second respectively. The intrahepatic IVC is 2.2 cm in diameter. Evaluation of the IVC is limited as the patient is on non-rebreather. The pack artery is patent as visualized with velocity of 76 centimeter/second. The visualized splenic vein appears patent with appropriate flow direction and velocity of 13 centimeter/second. The spleen measures 10.0 x 7.4 x 4.8 cm for a volume of 186 cc. No ascites. IMPRESSION: 1. Mildly coarsened liver echotexture. 2. A 6 mm gallbladder polyp versus a nonshadowing stone. Diffusely thickened gallbladder wall may be chronic or related to underlying liver disease. Clinical correlation is recommended. A hepatobiliary scintigraphy may provide better evaluation of the gallbladder if there is a high clinical concern for acute cholecystitis . 3. Patent main portal vein with hepatopetal flow. 4. No ascites. Electronically Signed   By: ArasAnner Crete.   On: 04/20/2018 03:33   Us [place]omen Limited Ruq  Result Date: 04/20/2018 CLINICAL DATA:  62 y52r old female with elevated liver enzymes. EXAM: ULTRASOUND ABDOMEN LIMITED RIGHT UPPER QUADRANT AND DOPPLER. COMPARISON:  Ultrasound dated 01/28/2017 and CT dated 01/28/2017 FINDINGS: Gallbladder: There is a nonmobile and  nonshadowing 6 mm echogenic focus within the gallbladder which may represent a polyp  versus a nonshadowing small stone. The gallbladder wall is thickened and mildly edematous measuring 7 mm in thickness. No pericholecystic fluid. Negative sonographic Murphy's sign. Common bile duct: Diameter: 3 mm Liver: Mildly coarsened liver echotexture. The main portal vein is patent with hepatopetal flow. The portal vein diameter is 7 mm. The velocities in the portal vein are 34 cm/s, 34 cm/second, and 46 centimeter/second (proximally, mid, and distally respectively). The velocity within the right portal vein is 38 centimeter/second and in the left portal vein 29 centimeter/second. There is pulsatility of the portal vein which may be related to underlying liver disease or right heart dysfunction. The hepatic veins are patent as visualized with somewhat turbulent flow which may be related to underlying right heart dysfunction. The velocities in the right, middle, and left hepatic veins are 38, 29, and 29 centimeter/second respectively. The intrahepatic IVC is 2.2 cm in diameter. Evaluation of the IVC is limited as the patient is on non-rebreather. The pack artery is patent as visualized with velocity of 76 centimeter/second. The visualized splenic vein appears patent with appropriate flow direction and velocity of 13 centimeter/second. The spleen measures 10.0 x 7.4 x 4.8 cm for a volume of 186 cc. No ascites. IMPRESSION: 1. Mildly coarsened liver echotexture. 2. A 6 mm gallbladder polyp versus a nonshadowing stone. Diffusely thickened gallbladder wall may be chronic or related to underlying liver disease. Clinical correlation is recommended. A hepatobiliary scintigraphy may provide better evaluation of the gallbladder if there is a high clinical concern for acute cholecystitis . 3. Patent main portal vein with hepatopetal flow. 4. No ascites. Electronically Signed   By: Anner Crete M.D.   On: 04/20/2018 03:33    ASSESSMENT AND PLAN:   Active Problems:   Acute hypoxemic respiratory failure (HCC)  *  Ac on Ch hypoxic respi failure   Due to Severe pulm hypertension   Pneumonia can not be ruled out.    Will cont Nebs, Antibiotics, sildenafil.   pulm consult   Try to taper oxygen.  * Elevated troponin      May be demand ischemia   Can not r/o NSTEMI yet, so on heparine drip.   Cardio consult.  * CKD stage 3   Appears stable  * elevated LFTs   Likely due to liver congestion due to Pulm htn   Monitor. Ordered hepatitis and GI consult.  * Iron deficiency anemia   May need replacement iron on discharge.  * Hypertension   Metoprolol. Enalapril.  All the records are reviewed and case discussed with Care Management/Social Workerr. Management plans discussed with the patient, family and they are in agreement.  CODE STATUS: Full.  TOTAL TIME TAKING CARE OF THIS PATIENT: 35 minutes.   Discussed with her sister in room.  POSSIBLE D/C IN 1-2 DAYS, DEPENDING ON CLINICAL CONDITION.   Vaughan Basta M.D on 04/20/2018   Between 7am to 6pm - Pager - [phone]  After 6pm go to [email or web address] - password [email or web address] [facility]  Office  [phone]  CC: Primary care physician; Venia Carbon, MD  Note: This dictation was prepared with Dragon dictation along with smaller phrase technology. Any transcriptional errors that result from this process are unintentional.

## 2018-04-21 ENCOUNTER — Inpatient Hospital Stay (HOSPITAL_COMMUNITY)
Admit: 2018-04-21 | Discharge: 2018-04-21 | Disposition: A | Discharge status: Home / Self Care | Payer: PPO | Attending: Nurse Practitioner | Admitting: Nurse Practitioner

## 2018-04-21 ENCOUNTER — Encounter: Payer: Self-pay | Admitting: Nurse Practitioner

## 2018-04-21 ENCOUNTER — Other Ambulatory Visit: Payer: Self-pay | Admitting: Internal Medicine

## 2018-04-21 DIAGNOSIS — R748 Abnormal levels of other serum enzymes: Secondary | ICD-10-CM

## 2018-04-21 DIAGNOSIS — I272 Pulmonary hypertension, unspecified: Secondary | ICD-10-CM

## 2018-04-21 DIAGNOSIS — R778 Other specified abnormalities of plasma proteins: Secondary | ICD-10-CM

## 2018-04-21 DIAGNOSIS — N179 Acute kidney failure, unspecified: Secondary | ICD-10-CM

## 2018-04-21 DIAGNOSIS — I50813 Acute on chronic right heart failure: Secondary | ICD-10-CM

## 2018-04-21 DIAGNOSIS — N184 Chronic kidney disease, stage 4 (severe): Secondary | ICD-10-CM

## 2018-04-21 DIAGNOSIS — R7989 Other specified abnormal findings of blood chemistry: Secondary | ICD-10-CM

## 2018-04-21 DIAGNOSIS — I361 Nonrheumatic tricuspid (valve) insufficiency: Secondary | ICD-10-CM

## 2018-04-21 DIAGNOSIS — J9621 Acute and chronic respiratory failure with hypoxia: Secondary | ICD-10-CM

## 2018-04-21 LAB — IRON AND TIBC
IRON: 14 ug/dL — AB (ref 28–170)
SATURATION RATIOS: 3 % — AB (ref 10.4–31.8)
TIBC: 424 ug/dL (ref 250–450)
UIBC: 410 ug/dL

## 2018-04-21 LAB — GLUCOSE, CAPILLARY
GLUCOSE-CAPILLARY: 185 mg/dL — AB (ref 70–99)
GLUCOSE-CAPILLARY: 156 mg/dL — AB (ref 70–99)
Glucose-Capillary: 97 mg/dL (ref 70–99)
Glucose-Capillary: 178 mg/dL — ABNORMAL HIGH (ref 70–99)

## 2018-04-21 LAB — HEPATITIS PANEL, ACUTE
HEP B C IGM: NEGATIVE
HEP B S AG: NEGATIVE
Hep A IgM: NEGATIVE

## 2018-04-21 LAB — VITAMIN D 25 HYDROXY (VIT D DEFICIENCY, FRACTURES): VIT D 25 HYDROXY: 51.7 ng/mL (ref 30.0–100.0)

## 2018-04-21 LAB — FERRITIN: Ferritin: 29 ng/mL (ref 11–307)

## 2018-04-21 LAB — ECHOCARDIOGRAM COMPLETE
Height: 61 in
WEIGHTICAEL: 2752.01 [oz_av]

## 2018-04-21 LAB — ACETAMINOPHEN LEVEL

## 2018-04-21 LAB — PROCALCITONIN: Procalcitonin: 0.35 ng/mL

## 2018-04-21 NOTE — Progress Notes (Signed)
Pt told nurse, she want to discuss with her son before making decidion about DNR today. Changed back to FULL CODE.

## 2018-04-21 NOTE — Consult Note (Signed)
Cardiology Consult    Patient ID: Lindsay Harmon MRN: [id number], DOB/AGE: [birth year]   Admit date: 04/19/2018 Date of Consult: 04/21/2018  Primary Physician: Venia Carbon, MD Primary Cardiologist: Glori Bickers, MD Requesting Provider: Doylene Canning, MD  Patient Profile    Lindsay Harmon is a 62 y.o. female with a history of severe COPD, PAH [PA 92/33(57) - 12/2017], HTN, HL, NIDDM, GERD, CKD IV, and IBS who is being seen today for the evaluation of hypoxia and right heart failure at the request of Dr. Anselm Jungling.   Past Medical History   Past Medical History:  Diagnosis Date  . Allergy   . Aortic atherosclerosis ([place])   . Arthritis   . CKD (chronic kidney disease), stage IV ([place])   . COPD (chronic obstructive pulmonary disease) ([place])    a. 11/2014 PFT's: FEV1 0.87 (38%), FVC 1.41 (48%), FEF 25-75 0.41 (19%). Unable to perform DLCO; b. 12/2017 Simple spirometry ration 67%. FEV1 1.21 L+53% predicted. FVC 1.79L 61% predicted.  . Coronary artery calcification seen on CT scan    a. 03/2017 CT Chest.  . Depression   . Diabetic neuropathy ([place])   . Esophageal stricture   . Familial hematuria   . GERD (gastroesophageal reflux disease)   . Hyperlipidemia   . Hypertension   . IBS (irritable bowel syndrome)   . Nephrolithiasis   . NIDDM (non-insulin dependent diabetes mellitus)   . Obesity, unspecified   . Pulmonary hypertension ([place])    a. WHO Group 3; b. 10/2016 Echo: EF 55-60%, no rwma, Gr1 DD, mild to mod AS, mean grad 21mHg, mildly dil RV w/ mildly reduced RV fxn, mildly dil RA. PASP 1134mg; c. 12/2017 RHC: RA 11, RV 92/12, PA 92/33(57), PCWP 15, Fick CO/CI 5.9/3.3. PVR 7.2 WU. Ao 93%, PA 62/64%.  . Marland KitchenVD (peripheral vascular disease) ([place]    Past Surgical History:  Procedure Laterality Date  . ABDOMINAL HYSTERECTOMY    . CARPAL TUNNEL RELEASE    . CESAREAN SECTION    . ERCP N/A 03/17/2017   Procedure: ENDOSCOPIC RETROGRADE CHOLANGIOPANCREATOGRAPHY (ERCP);  Surgeon: WoLucilla LameMD;  [facility];  Service: Endoscopy;  Laterality: N/A;  . EUS N/A 03/13/2017   Procedure: FULL UPPER ENDOSCOPIC ULTRASOUND (EUS) RADIAL;  Surgeon: Burbridge, ReMurray HodgkinsMD;  Location: [facility] ENDOSCOPY;  Service: Endoscopy;  Laterality: N/A;  . RIGHT HEART CATH N/A 01/22/2018   Procedure: RIGHT HEART CATH;  Surgeon: BeJolaine ArtistMD;  [facility];  Service: Cardiovascular;  Laterality: N/A;  . RIGHT HEART CATHETERIZATION N/A 12/05/2014   Procedure: RIGHT HEART CATH;  Surgeon: HeSinclair GroomsMD;  [facility];  Service: Cardiovascular;  Laterality: N/A;     Allergies  Allergies  Allergen Reactions  . Sertraline Hcl Other (See Comments)    Cloudy thoughts, malaise    History of Present Illness    [age]ear old female with a history of hypertension, diabetes, irritable bowel syndrome, depression, GERD, severe COPD, HFpEF, CKD IV, and pulmonary arterial hypertension with RV failure and cor pulmonale.  [place] She had been maintained on outpatient doses of torsemide and occasional metolazone.  She underwent right heart diagnostic catheterization in March which revealed severe pulmonary arterial hypertension with normal  pulmonary capillary wedge pressure.  She was last seen in heart failure clinic in April, she was placed on sildenafil 50 mg 3 times daily.  She actually  did not fill this prescription until late May and says shortly after beginning it, she noted some improvement in respiratory status and exercise tolerance.  She is chronically on 4 L of oxygen via nasal cannula at home.  She was in her usual state of health until the early evening hours of June 22, when she had just finished eating and had not even left the table yet and began feeling poorly.  She is fairly vague about this but at some point she developed nausea and the following morning, she remained nauseated and even  was having dry heaving.  She had one episode of small-volume emesis.  Shortly thereafter, sometime around noon on the 23rd, she developed substernal chest tightness and dyspnea.  The tightness resolved within about an hour, she continued to have dyspnea.  She checked her pulse oximeter and found her saturations to be in the 70s.  At that point, she and a family member called EMS.  Here, ECG was nonacute.  Creatinine was slightly elevated above baseline at 2.70.  She had marked LFT abnormalities with an AST of 1643, ALT of 646, total bilirubin of 2.6.  Her BNP was elevated at 1679.  Initial troponin was  0.56.  She initially required nonrebreather and is currently on high flow nasal cannula.  She has not had any further chest pain though, troponin rose/peaked at 1.79 and is now trending down.  In addition to LFT abnormalities, INR has been elevated, initially at 2.88 and now 3.80.  Overall, she feels as though her breathing has improved.  She has not had any chest tightness since the day of admission.  She denies any change in weight, lower extremity swelling, PND, orthopnea, prior to admission.  Inpatient Medications    . aspirin  81 mg Oral Daily  . enalapril  5 mg Oral Daily  . insulin aspart  0-15 Units Subcutaneous TID WC  . methadone  20 mg Oral TID WC & HS  . metoprolol tartrate  12.5 mg Oral BID  . pantoprazole  40 mg Oral BID  . sodium chloride flush  3 mL Intravenous Q12H    Family History    Family History  Problem Relation Age of Onset  . Diabetes Mother   . Cancer Mother        ovarian,melanoma  . Allergies Father   . Cancer Father        lung  . Cancer Maternal Grandmother        uterine  . Emphysema Paternal Grandfather   . Allergies Sister   . Breast cancer Neg Hx    reported the following about her mother: ovarian cancer, melonoma. She reported the following about her father: lung cancer. She indicated that the status of her sister is unknown. She reported the following  about her maternal grandmother: uterine cancer. She indicated that the status of her paternal grandfather is unknown. She reported the following about her maternal uncle: thyroid cancer. She indicated that the status of her neg hx is unknown.   Social History    Social History   Socioeconomic History  . Marital status: Legally Separated    Spouse name: Not on file  . Number of children: 1  . Years of education: Not on file  . Highest education level: Not on file  Occupational History  . 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 Financial resource strain: Not on file  . Food insecurity:  Worry: Not on file    Inability: Not on file  . Transportation needs:    Medical: Not on file    Non-medical: Not on file  Tobacco Use  . Smoking status: Former Smoker    Packs/day: 1.50    Years: 20.00    Pack years: 30.00    Types: Cigarettes    Last attempt to quit: 01/04/2009    Years since quitting: 9.2  . Smokeless tobacco: Never Used  Substance and Sexual Activity  . Alcohol use: No    Alcohol/week: 0.0 oz    Comment: heavy in the past  . Drug use: No  . Sexual activity: Never  Lifestyle  . Physical activity:    Days per week: Not on file    Minutes per session: Not on file  . Stress: Not on file  Relationships  . Social connections:    Talks on phone: Not on file    Gets together: Not on file    Attends religious service: Not on file    Active member of club or organization: Not on file    Attends meetings of clubs or organizations: Not on file    Relationship status: Not on file  . Intimate partner violence:    Fear of current or ex partner: Not on file    Emotionally abused: Not on file    Physically abused: Not on file    Forced sexual activity: Not on file  Other Topics Concern  . Not on file  Social History Narrative   No living will   Son [name] (then mom) should make decisions for her if she is unable.    Would  accept resuscitation attempts but no prolonged ventilation   Not sure about tube feeds but probably wouldn't want them if cognitively unaware     Review of Systems    General:  No chills, fever, night sweats or weight changes.  Cardiovascular:  +++ chest pain on day of admission, +++ chronic dyspnea on exertion, no edema, orthopnea, palpitations, paroxysmal nocturnal dyspnea. Dermatological: No rash, lesions/masses Respiratory: No cough, +++ dyspnea Urologic: No hematuria, dysuria Abdominal:   +++ nausea w/ dry heaving and one episode of small volume emesis on Sunday, no diarrhea, bright red blood per rectum, melena, or hematemesis Neurologic:  No visual changes, wkns, changes in mental status. All other systems reviewed and are otherwise negative except as noted above.  Physical Exam    Blood pressure 98/67, pulse 86, temperature 97.8 F (36.6 C), temperature source Oral, resp. rate 18, height _0  (1.549 m), weight 172 lb (78 kg), SpO2 91 %.  General: Pleasant, NAD Psych: Normal affect. Neuro: Alert and oriented X 3. Moves all extremities spontaneously. HEENT: Normal  Neck: Supple without bruits or JVD. Lungs:  Resp regular and unlabored, fine crackles @ bilat bases. Heart: RRR no s3, s4, 2/6 syst murmur @ LLSB. Abdomen: Soft, non-tender, non-distended, BS + x 4.  Extremities: No clubbing, cyanosis or edema. DP/PT/Radials 2+ and equal bilaterally.  Labs    Recent Labs    04/19/18 2214 04/20/18 0512 04/20/18 1141 04/20/18 1924  TROPONINI 0.56* 1.44* 1.79* 1.56*   Lab Results  Component Value Date   WBC 13.8 (H) 04/20/2018   HGB 9.7 (L) 04/20/2018   HCT 32.5 (L) 04/20/2018   MCV 65.1 (L) 04/20/2018   PLT 245 04/20/2018    Recent Labs  Lab 04/20/18 0903  NA 133*  K 4.1  CL 96*  CO2 24  BUN  46*  CREATININE 2.41*  CALCIUM 8.4*  PROT 6.3*  BILITOT 1.8*  ALKPHOS 65  ALT 966*  AST 2,201*  GLUCOSE 150*   Lab Results  Component Value Date   CHOL 120  10/22/2017   HDL 34.90 (L) 10/22/2017   LDLCALC 54 10/22/2017   TRIG 151.0 (H) 10/22/2017    Radiology Studies    Dg Abd 1 View  Result Date: 04/20/2018 CLINICAL DATA:  Distended urinary bladder. EXAM: ABDOMEN - 1 VIEW COMPARISON:  CT scan of January 28, 2017. FINDINGS: The bowel gas pattern is normal. No radio-opaque calculi or other significant radiographic abnormality are seen. IMPRESSION: No definite evidence of bowel obstruction or ileus. Electronically Signed   By: Marijo Conception, M.D.   On: 04/20/2018 17:06   Nm Pulmonary Perf And Vent  Result Date: 04/20/2018 CLINICAL DATA:  COPD. Short of breath. Concern for pulmonary embolism. High pretest probability EXAM: NUCLEAR MEDICINE VENTILATION - PERFUSION LUNG SCAN TECHNIQUE: Ventilation images were obtained in multiple projections using inhaled aerosol Tc-38mDTPA. Perfusion images were obtained in multiple projections after intravenous injection of Tc-99mAA. RADIOPHARMACEUTICALS:  32.9 mCi of Tc-9935mPA aerosol inhalation and 4.36 mCi Tc99m15m IV COMPARISON:  6 chest radiograph 04/19/2018 FINDINGS: Ventilation: No focal ventilation defect. Perfusion: No wedge shaped peripheral perfusion defects to suggest acute pulmonary embolism. IMPRESSION: No evidence acute pulmonary embolism. Electronically Signed   By: StewSuzy Bouchard.   On: 04/20/2018 12:27   Dg Chest Portable 1 View  Result Date: 04/19/2018 CLINICAL DATA:  Acute onset shortness of breath. EXAM: PORTABLE CHEST 1 VIEW COMPARISON:  CT 04/13/2018 FINDINGS: Borderline cardiomegaly with aortic atherosclerosis. No aneurysm noted. Prominence in the region of the left atrial appendage is likely related to the patient's known dilatation of the main pulmonary artery likely reflecting chronic pulmonary hypertension. Lungs are hyperinflated without pulmonary consolidation or effusion. No pneumothorax. No acute osseous abnormality. IMPRESSION: Borderline cardiomegaly with aortic atherosclerosis.  Hyperinflated lungs with prominence main pulmonary artery likely reflecting chronic pulmonary hypertension and COPD. Electronically Signed   By: DaviAshley Royalty.   On: 04/19/2018 23:02   Ct Chest Lung Cancer Screening Low Dose Wo Contrast  Result Date: 04/13/2018 CLINICAL DATA:  Ex-smoker, quitting 9 years ago. Thirty pack-year history. Asymptomatic. EXAM: CT CHEST WITHOUT CONTRAST LOW-DOSE FOR LUNG CANCER SCREENING TECHNIQUE: Multidetector CT imaging of the chest was performed following the standard protocol without IV contrast. COMPARISON:  04/09/2017 FINDINGS: Cardiovascular: Advanced aortic and branch vessel atherosclerosis. Tortuous thoracic aorta. Mild cardiomegaly with trace pericardial fluid or thickening. Multivessel coronary artery atherosclerosis. Aortic valve calcification. Pulmonary artery enlargement, outflow tract 3.7 cm. Mediastinum/Nodes: No mediastinal or hilar adenopathy. Lungs/Pleura: No pleural fluid. Moderate centrilobular emphysema. Bilateral pulmonary nodules of maximally volume derived equivalent diameter 4.4 mm are not significantly changed. No new or enlarging pulmonary nodules. Bibasilar scarring. Upper Abdomen: Normal imaged portions of the liver, spleen, stomach, pancreas, adrenal glands. Mild renal cortical thinning bilaterally. Advanced abdominal aortic atherosclerosis. Musculoskeletal: Moderate thoracic spondylosis. IMPRESSION: 1. Lung-RADS 2, benign appearance or behavior. Continue annual screening with low-dose chest CT without contrast in 12 months. 2. Age advanced coronary artery atherosclerosis. Recommend assessment of coronary risk factors and consideration of medical therapy. 3. Aortic atherosclerosis (ICD10-I70.0) and emphysema (ICD10-J43.9). 4. Aortic valvular calcifications. Consider echocardiography to evaluate for valvular dysfunction. 5. Pulmonary artery enlargement suggests pulmonary arterial hypertension. Electronically Signed   By: KyleAbigail Miyamoto.   On:  04/13/2018 13:53   Us L[place]er Doppler  Result Date: 04/20/2018 CLINICAL  DATA:  62 year old female with elevated liver enzymes. EXAM: ULTRASOUND ABDOMEN LIMITED RIGHT UPPER QUADRANT AND DOPPLER. COMPARISON:  Ultrasound dated 01/28/2017 and CT dated 01/28/2017 FINDINGS: Gallbladder: There is a nonmobile and nonshadowing 6 mm echogenic focus within the gallbladder which may represent a polyp versus a nonshadowing small stone. The gallbladder wall is thickened and mildly edematous measuring 7 mm in thickness. No pericholecystic fluid. Negative sonographic Murphy's sign. Common bile duct: Diameter: 3 mm Liver: Mildly coarsened liver echotexture. The main portal vein is patent with hepatopetal flow. The portal vein diameter is 7 mm. The velocities in the portal vein are 34 cm/s, 34 cm/second, and 46 centimeter/second (proximally, mid, and distally respectively). The velocity within the right portal vein is 38 centimeter/second and in the left portal vein 29 centimeter/second. There is pulsatility of the portal vein which may be related to underlying liver disease or right heart dysfunction. The hepatic veins are patent as visualized with somewhat turbulent flow which may be related to underlying right heart dysfunction. The velocities in the right, middle, and left hepatic veins are 38, 29, and 29 centimeter/second respectively. The intrahepatic IVC is 2.2 cm in diameter. Evaluation of the IVC is limited as the patient is on non-rebreather. The pack artery is patent as visualized with velocity of 76 centimeter/second. The visualized splenic vein appears patent with appropriate flow direction and velocity of 13 centimeter/second. The spleen measures 10.0 x 7.4 x 4.8 cm for a volume of 186 cc. No ascites. IMPRESSION: 1. Mildly coarsened liver echotexture. 2. A 6 mm gallbladder polyp versus a nonshadowing stone. Diffusely thickened gallbladder wall may be chronic or related to underlying liver disease. Clinical correlation  is recommended. A hepatobiliary scintigraphy may provide better evaluation of the gallbladder if there is a high clinical concern for acute cholecystitis . 3. Patent main portal vein with hepatopetal flow. 4. No ascites. Electronically Signed   By: Anner Crete M.D.   On: 04/20/2018 03:33   US Abdomen Limited Ruq  Result Date: 04/20/2018 CLINICAL DATA:  62 year old female with elevated liver enzymes. EXAM: ULTRASOUND ABDOMEN LIMITED RIGHT UPPER QUADRANT AND DOPPLER. COMPARISON:  Ultrasound dated 01/28/2017 and CT dated 01/28/2017 FINDINGS: Gallbladder: There is a nonmobile and nonshadowing 6 mm echogenic focus within the gallbladder which may represent a polyp versus a nonshadowing small stone. The gallbladder wall is thickened and mildly edematous measuring 7 mm in thickness. No pericholecystic fluid. Negative sonographic Murphy's sign. Common bile duct: Diameter: 3 mm Liver: Mildly coarsened liver echotexture. The main portal vein is patent with hepatopetal flow. The portal vein diameter is 7 mm. The velocities in the portal vein are 34 cm/s, 34 cm/second, and 46 centimeter/second (proximally, mid, and distally respectively). The velocity within the right portal vein is 38 centimeter/second and in the left portal vein 29 centimeter/second. There is pulsatility of the portal vein which may be related to underlying liver disease or right heart dysfunction. The hepatic veins are patent as visualized with somewhat turbulent flow which may be related to underlying right heart dysfunction. The velocities in the right, middle, and left hepatic veins are 38, 29, and 29 centimeter/second respectively. The intrahepatic IVC is 2.2 cm in diameter. Evaluation of the IVC is limited as the patient is on non-rebreather. The pack artery is patent as visualized with velocity of 76 centimeter/second. The visualized splenic vein appears patent with appropriate flow direction and velocity of 13 centimeter/second. The spleen  measures 10.0 x 7.4 x 4.8 cm for a volume of 186 cc.  No ascites. IMPRESSION: 1. Mildly coarsened liver echotexture. 2. A 6 mm gallbladder polyp versus a nonshadowing stone. Diffusely thickened gallbladder wall may be chronic or related to underlying liver disease. Clinical correlation is recommended. A hepatobiliary scintigraphy may provide better evaluation of the gallbladder if there is a high clinical concern for acute cholecystitis . 3. Patent main portal vein with hepatopetal flow. 4. No ascites. Electronically Signed   By: Anner Crete M.D.   On: 04/20/2018 03:33    ECG & Cardiac Imaging    Sinus tachycardia, 111, LAE, no acute changes.  Assessment & Plan    1.  PAH:  Pt w/ h/o PAH in the setting of severe COPD.  Presumed to be WHO group 3.  RHC in March w/ PA of 92/33 (57).  Recently placed on sildenafil 50 TID - started in late May.  Noted improvement in resp status/ex tolerance over the past few wks.  Developed nausea beginning on 6/22, which worsened 6/23, and became assoc with chest tightness (1 hr) and increasing dyspnea/hypoxia w/ O2 sats in the 70's @ home  EMS  ER.  Here, noted to have marked elevation of LFT's and INR w/ mild troponin elevation.  V:Q scan low prob for PE.  Suspect hepatic changes 2/2 PAH and right heart failure, likely in the setting of a period of hypotension that occurred prior to arrival to the hospital.  Hypoxia in setting of hypotension and V:Q mismatch related to sildenafil therapy also possible.  Discussed with Dr. Haroldine Laws.  We have d/c'd sildenafil and would not plan to resume.  Echo pending - no definity in setting of PAH.  Could consider RHC pending echo result to re-eval R heart pressures and RV fxn, though with coagulopathy, not ideal.  If not significant improvement off of sildenafil, will need to consider tx to [facility] vs. Palliative care.  2.  Troponin Elevation: No prior h/o CAD though coronary Ca2+ noted on CT's in past.  Suspect demand ischemia in  setting of hypoxia.  F/u echo to eval LVEF.  She is not a candidate for LHC 2/2 renal failure and coagulopathy.  No c/p since 6/22.  Cont  blocker.  3.  COPD/Hypoxia:  Complicated by PAH and right heart failure.  Pulm following.  4.  Essential HTN:  Stable.  Watch for hypotension.  5.  CKD IV:  Creat mildly elevated above baseline. Follow.    6.  Elevated LFTs:  GI eval pending.  Mostly likely 2/2 R heart failure.  Signed, Murray Hodgkins, NP 04/21/2018, 11:16 AM  For questions or updates, please contact   Please consult [email or web address] for contact info under Cardiology/STEMI.

## 2018-04-21 NOTE — Progress Notes (Signed)
Family Meeting Note  Advance Directive:no  Today a meeting took place with the Patient and sister.  The following clinical team members were present during this meeting:MD  The following were discussed:Patient's diagnosis: COPD, Ch oxygen use, CKD stage 3, Right sided heart pressure, elevated hepatic enzymes., Patient's progosis: < 12 months and Goals for treatment: DNR  Additional follow-up to be provided: GI, cardio, Pulm consult.  Time spent during discussion:20 minutes  Vaughan Basta, MD

## 2018-04-21 NOTE — Consult Note (Signed)
[name], MD [facility]  [address], [place], [place] [address] Phone: [phone] Fax : [phone]  Consultation  Referring Provider:     Dr. Anselm Jungling Primary Care Physician:  Venia Carbon, MD Primary Gastroenterologist:  Dr. Allen Norris         Reason for Consultation:     Abnormal liver enzymes  Date of Admission:  04/19/2018 Date of Consultation:  04/21/2018         HPI:   Lindsay Harmon is a 62 y.o. female who is being seen today for abnormal liver enzymes.  The patient has a history of having common bile duct stones with an ERCP back on May 21 of 2018.  The patient had a periampullar diverticulum at that time and had a stone extraction via the ERCP done by me.  The patient also has a history of a endoscopic ultrasound prior to that procedure.  Patient was admitted with a report of weakness.  She also has a history of extensive alcohol abuse approximately 40 years ago that went on for approximately 5 to 6 years.  The patient also has imaging in the past that showed a coarse liver consistent with cirrhosis.  She also has COPD and is on chronic oxygen.  Denies any sick contacts.  The patient reports that she started feeling ill on the 22nd of this month with some nausea.  The patient's liver enzymes yesterday were found to be elevated with an AST of 1643 that went up to 2201 today.  The patient also had an ALT that was 646 with a increase today of 966.  Her previous liver enzymes were normal in May 2018 but prior to that had been in the range of 100-500.  The patient's bilirubin was elevated at 2.6 yesterday and is now down to 1.8.  She denies any NSAID use or Tylenol intake.  Past Medical History:  Diagnosis Date  . Allergy   . Aortic atherosclerosis ([place])   . Arthritis   . CKD (chronic kidney disease), stage IV ([place])   . COPD (chronic obstructive pulmonary disease) ([place])    a. 11/2014 PFT's: FEV1 0.87 (38%), FVC 1.41 (48%), FEF 25-75 0.41 (19%). Unable to perform DLCO; b.  12/2017 Simple spirometry ration 67%. FEV1 1.21 L+53% predicted. FVC 1.79L 61% predicted.  . Coronary artery calcification seen on CT scan    a. 03/2017 CT Chest.  . Depression   . Diabetic neuropathy ([place])   . Esophageal stricture   . Familial hematuria   . GERD (gastroesophageal reflux disease)   . Hyperlipidemia   . Hypertension   . IBS (irritable bowel syndrome)   . Nephrolithiasis   . NIDDM (non-insulin dependent diabetes mellitus)   . Obesity, unspecified   . Pulmonary hypertension ([place])    a. WHO Group 3; b. 10/2016 Echo: EF 55-60%, no rwma, Gr1 DD, mild to mod AS, mean grad 69mHg, mildly dil RV w/ mildly reduced RV fxn, mildly dil RA. PASP 1198mg; c. 12/2017 RHC: RA 11, RV 92/12, PA 92/33(57), PCWP 15, Fick CO/CI 5.9/3.3. PVR 7.2 WU. Ao 93%, PA 62/64%.  . Marland KitchenVD (peripheral vascular disease) ([place]    Past Surgical History:  Procedure Laterality Date  . ABDOMINAL HYSTERECTOMY    . CARPAL TUNNEL RELEASE    . CESAREAN SECTION    . ERCP N/A 03/17/2017   Procedure: ENDOSCOPIC RETROGRADE CHOLANGIOPANCREATOGRAPHY (ERCP);  Surgeon: WoLucilla LameMD;  [facility];  Service: Endoscopy;  Laterality: N/A;  .  EUS N/A 03/13/2017   Procedure: FULL UPPER ENDOSCOPIC ULTRASOUND (EUS) RADIAL;  Surgeon: Burbridge, Murray Hodgkins, MD;  Location: [facility] ENDOSCOPY;  Service: Endoscopy;  Laterality: N/A;  . RIGHT HEART CATH N/A 01/22/2018   Procedure: RIGHT HEART CATH;  Surgeon: Jolaine Artist, MD;  Location: [place] CV LAB;  Service: Cardiovascular;  Laterality: N/A;  . RIGHT HEART CATHETERIZATION N/A 12/05/2014   Procedure: RIGHT HEART CATH;  Surgeon: Sinclair Grooms, MD;  Location[facility] CATH LAB;  Service: Cardiovascular;  Laterality: N/A;    Prior to Admission medications   Medication Sig Start Date End Date Taking? Authorizing Provider  aspirin 325 MG tablet Take 325 mg by mouth at bedtime.    Yes [provider]  calcitRIOL (ROCALTROL) 0.5 MCG capsule Take 0.5 mcg by mouth  daily.  02/03/17  Yes [provider]  cholecalciferol (VITAMIN D) 1000 units tablet Take 1,000 Units by mouth at bedtime.    Yes [provider]  enalapril (VASOTEC) 5 MG tablet Take 1 tablet (5 mg total) by mouth daily. 01/29/17  Yes Fritzi Mandes, MD  gabapentin (NEURONTIN) 300 MG capsule Take 1 capsule by mouth in the morning, 1 capsule at noon, and 2 capsules at bedtime 09/15/17  Yes Venia Carbon, MD  glipiZIDE (GLUCOTROL) 5 MG tablet Take 1 tablet by mouth twice a day before a meal 03/09/18  Yes Venia Carbon, MD  Insulin Glargine (LANTUS) 100 UNIT/ML Solostar Pen Inject 32 Units into the skin daily. Patient taking differently: Inject 30 Units into the skin daily.  07/23/17  Yes Viviana Simpler I, MD  Insulin Pen Needle (SURE COMFORT PEN NEEDLES) 31G X 5 MM MISC USE 1 PEN NEEDLE TO INJECT INSULIN 07/23/17  Yes Venia Carbon, MD  loratadine (CLARITIN) 10 MG tablet Take 10 mg by mouth 2 (two) times daily.    Yes [provider]  lovastatin (MEVACOR) 40 MG tablet Take 2 tablets (80 mg total) by mouth at bedtime. 12/05/17  Yes Venia Carbon, MD  methadone (DOLOPHINE) 10 MG tablet Take 2 tablets (20 mg total) by mouth 4 (four) times daily. 04/17/18  Yes Venia Carbon, MD  mirtazapine (REMERON) 30 MG tablet Take 15 mg by mouth at bedtime.   Yes [provider]  ONE TOUCH ULTRA TEST test strip USE 1 STRIP TO TEST BLOOD SUGAR ONCE DAILY 07/21/17  Yes Venia Carbon, MD  pantoprazole (PROTONIX) 40 MG tablet Take 1 tablet by mouth twice a day 10/03/17  Yes Venia Carbon, MD  sildenafil (REVATIO) 20 MG tablet Take 1 tablet (20 mg total) by mouth 3 (three) times daily. 02/19/18  Yes Bensimhon, Shaune Pascal, MD  STIOLTO RESPIMAT 2.5-2.5 MCG/ACT AERS Inhale 2 puffs into the lungs daily. 11/03/17  Yes Juanito Doom, MD  torsemide (DEMADEX) 20 MG tablet Take 2 tablets (40 mg total) by mouth daily. 01/07/18  Yes Bensimhon, Shaune Pascal, MD  metoprolol tartrate  (LOPRESSOR) 25 MG tablet Take 1/2 tablet by mouth twice a day 04/21/18   Venia Carbon, MD  nitroGLYCERIN (NITROSTAT) 0.4 MG SL tablet Place 1 tablet (0.4 mg total) under the tongue every 5 (five) minutes as needed for chest pain. 07/23/16   Venia Carbon, MD  VENTOLIN HFA 108 (90 Base) MCG/ACT inhaler USE 2 PUFFS EVERY SIX HOURS AS NEEDED FOR WHEEZING OR SHORTNESS OF BREATH 08/28/17   Juanito Doom, MD    Family History  Problem Relation Age of Onset  .  Diabetes Mother   . Cancer Mother        ovarian,melanoma  . Allergies Father   . Cancer Father        lung  . Cancer Maternal Grandmother        uterine  . Emphysema Paternal Grandfather   . Allergies Sister   . Breast cancer Neg Hx      Social History   Tobacco Use  . Smoking status: Former Smoker    Packs/day: 1.50    Years: 20.00    Pack years: 30.00    Types: Cigarettes    Last attempt to quit: 01/04/2009    Years since quitting: 9.2  . Smokeless tobacco: Never Used  Substance Use Topics  . Alcohol use: No    Alcohol/week: 0.0 oz    Comment: heavy in the past  . Drug use: No    Allergies as of 04/19/2018 - Review Complete 04/19/2018  Allergen Reaction Noted  . Sertraline hcl Other (See Comments) 05/29/2016    Review of Systems:    All systems reviewed and negative except where noted in HPI.   Physical Exam:  Vital signs in last 24 hours: Temp:  [97.8 F (36.6 C)] 97.8 F (36.6 C) (06/25 0740) Pulse Rate:  [86-89] 86 (06/25 0740) Resp:  [18] 18 (06/25 0403) BP: (98-103)/(57-71) 98/67 (06/25 0740) SpO2:  [90 %-97 %] 91 % (06/25 0740) Weight:  [172 lb (78 kg)] 172 lb (78 kg) (06/25 0500) Last BM Date: 04/20/18 General:   Pleasant, cooperative in NAD Head:  Normocephalic and atraumatic. Eyes:   No icterus.   Conjunctiva pink. PERRLA. Ears:  Normal auditory acuity. Neck:  Supple; no masses or thyroidomegaly Lungs: Respirations even and unlabored. Lungs clear to auscultation bilaterally.   No  wheezes, crackles, or rhonchi.  Heart:  Regular rate and rhythm;  Without murmur, clicks, rubs or gallops Abdomen:  Soft, nondistended, nontender. Normal bowel sounds. No appreciable masses or hepatomegaly.  No rebound or guarding.  Rectal:  Not performed. Msk:  Symmetrical without gross deformities.    Extremities:  Without edema, cyanosis or clubbing. Neurologic:  Alert and oriented x3;  grossly normal neurologically. Skin:  Intact without significant lesions or rashes. Cervical Nodes:  No significant cervical adenopathy. Psych:  Alert and cooperative. Normal affect.  LAB RESULTS: Recent Labs    04/19/18 2214 04/20/18 0903  WBC 13.8* 13.8*  HGB 11.0* 9.7*  HCT 38.8 32.5*  PLT 307 245   BMET Recent Labs    04/19/18 2214 04/20/18 0903  NA 139 133*  K 4.5 4.1  CL 95* 96*  CO2 19* 24  GLUCOSE 82 150*  BUN 42* 46*  CREATININE 2.78* 2.41*  CALCIUM 9.4 8.4*   LFT Recent Labs    04/20/18 0903  PROT 6.3*  ALBUMIN 3.2*  AST 2,201*  ALT 966*  ALKPHOS 65  BILITOT 1.8*   PT/INR Recent Labs    04/19/18 2214 04/20/18 0903  LABPROT 29.9* 37.2*  INR 2.88 3.80    STUDIES: Dg Abd 1 View  Result Date: 04/20/2018 CLINICAL DATA:  Distended urinary bladder. EXAM: ABDOMEN - 1 VIEW COMPARISON:  CT scan of January 28, 2017. FINDINGS: The bowel gas pattern is normal. No radio-opaque calculi or other significant radiographic abnormality are seen. IMPRESSION: No definite evidence of bowel obstruction or ileus. Electronically Signed   By: Marijo Conception, M.D.   On: 04/20/2018 17:06   Nm Pulmonary Perf And Vent  Result Date: 04/20/2018 CLINICAL DATA:  COPD. Short of breath. Concern for pulmonary embolism. High pretest probability EXAM: NUCLEAR MEDICINE VENTILATION - PERFUSION LUNG SCAN TECHNIQUE: Ventilation images were obtained in multiple projections using inhaled aerosol Tc-85mDTPA. Perfusion images were obtained in multiple projections after intravenous injection of Tc-991mAA.  RADIOPHARMACEUTICALS:  32.9 mCi of Tc-9938mPA aerosol inhalation and 4.36 mCi Tc99m54m IV COMPARISON:  6 chest radiograph 04/19/2018 FINDINGS: Ventilation: No focal ventilation defect. Perfusion: No wedge shaped peripheral perfusion defects to suggest acute pulmonary embolism. IMPRESSION: No evidence acute pulmonary embolism. Electronically Signed   By: StewSuzy Bouchard.   On: 04/20/2018 12:27   Dg Chest Portable 1 View  Result Date: 04/19/2018 CLINICAL DATA:  Acute onset shortness of breath. EXAM: PORTABLE CHEST 1 VIEW COMPARISON:  CT 04/13/2018 FINDINGS: Borderline cardiomegaly with aortic atherosclerosis. No aneurysm noted. Prominence in the region of the left atrial appendage is likely related to the patient's known dilatation of the main pulmonary artery likely reflecting chronic pulmonary hypertension. Lungs are hyperinflated without pulmonary consolidation or effusion. No pneumothorax. No acute osseous abnormality. IMPRESSION: Borderline cardiomegaly with aortic atherosclerosis. Hyperinflated lungs with prominence main pulmonary artery likely reflecting chronic pulmonary hypertension and COPD. Electronically Signed   By: DaviAshley Royalty.   On: 04/19/2018 23:02   Us L[place] Doppler  Result Date: 04/20/2018 CLINICAL DATA:  62 y23r old female with elevated liver enzymes. EXAM: ULTRASOUND ABDOMEN LIMITED RIGHT UPPER QUADRANT AND DOPPLER. COMPARISON:  Ultrasound dated 01/28/2017 and CT dated 01/28/2017 FINDINGS: Gallbladder: There is a nonmobile and nonshadowing 6 mm echogenic focus within the gallbladder which may represent a polyp versus a nonshadowing small stone. The gallbladder wall is thickened and mildly edematous measuring 7 mm in thickness. No pericholecystic fluid. Negative sonographic Murphy's sign. Common bile duct: Diameter: 3 mm Liver: Mildly coarsened liver echotexture. The main portal vein is patent with hepatopetal flow. The portal vein diameter is 7 mm. The velocities in the portal  vein are 34 cm/s, 34 cm/second, and 46 centimeter/second (proximally, mid, and distally respectively). The velocity within the right portal vein is 38 centimeter/second and in the left portal vein 29 centimeter/second. There is pulsatility of the portal vein which may be related to underlying liver disease or right heart dysfunction. The hepatic veins are patent as visualized with somewhat turbulent flow which may be related to underlying right heart dysfunction. The velocities in the right, middle, and left hepatic veins are 38, 29, and 29 centimeter/second respectively. The intrahepatic IVC is 2.2 cm in diameter. Evaluation of the IVC is limited as the patient is on non-rebreather. The pack artery is patent as visualized with velocity of 76 centimeter/second. The visualized splenic vein appears patent with appropriate flow direction and velocity of 13 centimeter/second. The spleen measures 10.0 x 7.4 x 4.8 cm for a volume of 186 cc. No ascites. IMPRESSION: 1. Mildly coarsened liver echotexture. 2. A 6 mm gallbladder polyp versus a nonshadowing stone. Diffusely thickened gallbladder wall may be chronic or related to underlying liver disease. Clinical correlation is recommended. A hepatobiliary scintigraphy may provide better evaluation of the gallbladder if there is a high clinical concern for acute cholecystitis . 3. Patent main portal vein with hepatopetal flow. 4. No ascites. Electronically Signed   By: ArasAnner Crete.   On: 04/20/2018 03:33   Us [place]omen Limited Ruq  Result Date: 04/20/2018 CLINICAL DATA:  62 y9r old female with elevated liver enzymes. EXAM: ULTRASOUND ABDOMEN LIMITED RIGHT UPPER QUADRANT AND DOPPLER. COMPARISON:  Ultrasound dated 01/28/2017 and CT dated 01/28/2017 FINDINGS: Gallbladder: There  is a nonmobile and nonshadowing 6 mm echogenic focus within the gallbladder which may represent a polyp versus a nonshadowing small stone. The gallbladder wall is thickened and mildly edematous  measuring 7 mm in thickness. No pericholecystic fluid. Negative sonographic Murphy's sign. Common bile duct: Diameter: 3 mm Liver: Mildly coarsened liver echotexture. The main portal vein is patent with hepatopetal flow. The portal vein diameter is 7 mm. The velocities in the portal vein are 34 cm/s, 34 cm/second, and 46 centimeter/second (proximally, mid, and distally respectively). The velocity within the right portal vein is 38 centimeter/second and in the left portal vein 29 centimeter/second. There is pulsatility of the portal vein which may be related to underlying liver disease or right heart dysfunction. The hepatic veins are patent as visualized with somewhat turbulent flow which may be related to underlying right heart dysfunction. The velocities in the right, middle, and left hepatic veins are 38, 29, and 29 centimeter/second respectively. The intrahepatic IVC is 2.2 cm in diameter. Evaluation of the IVC is limited as the patient is on non-rebreather. The pack artery is patent as visualized with velocity of 76 centimeter/second. The visualized splenic vein appears patent with appropriate flow direction and velocity of 13 centimeter/second. The spleen measures 10.0 x 7.4 x 4.8 cm for a volume of 186 cc. No ascites. IMPRESSION: 1. Mildly coarsened liver echotexture. 2. A 6 mm gallbladder polyp versus a nonshadowing stone. Diffusely thickened gallbladder wall may be chronic or related to underlying liver disease. Clinical correlation is recommended. A hepatobiliary scintigraphy may provide better evaluation of the gallbladder if there is a high clinical concern for acute cholecystitis . 3. Patent main portal vein with hepatopetal flow. 4. No ascites. Electronically Signed   By: Anner Crete M.D.   On: 04/20/2018 03:33      Impression / Plan:   Assessment: Active Problems:   Acute hypoxemic respiratory failure (HCC)   Lindsay Harmon is a 62 y.o. y/o female with abnormal liver enzymes over  thousand.  The patient has had abnormal liver enzymes in the past and has had an episode of hypotension which may explain the elevated liver enzymes.  The most common cause of abnormal liver enzymes over thousand is shock liver versus acute viral infection versus toxic ingestion.  Plan:  The patient will have her lab sent off for possible causes of the abnormal liver enzymes including acute hepatitis panel, ANA, SMA, AMA, ceruloplasmin and alpha-1 antitrypsin.  If the patient's liver enzymes do not start improving she may need a liver biopsy.  I would continue also to check the patient's INR since this has been elevated without a history of taking any Coumadin.  The patient has been explained the plan and agrees with it.   Thank you for involving me in the care of this patient.      LOS: 1 day   [name], MD  04/21/2018, 11:59 AM    Note: This dictation was prepared with Dragon dictation along with smaller phrase technology. Any transcriptional errors that result from this process are unintentional.

## 2018-04-21 NOTE — Progress Notes (Signed)
[facility] [facility] [place] NAME: Lindsay Harmon    MR#:  [id number]  DATE OF BIRTH:  1956/07/15  SUBJECTIVE:  CHIEF COMPLAINT:   Chief Complaint  Patient presents with  . Weakness  . Hypotension  . Shortness of Breath   Hx of COPD, Pulm Htn, on sildenafil- came with worsening SOB for last few days. Noted elevated Troponin, No chest pain.  REVIEW OF SYSTEMS:  CONSTITUTIONAL: No fever, fatigue or weakness.  EYES: No blurred or double vision.  EARS, NOSE, AND THROAT: No tinnitus or ear pain.  RESPIRATORY: have cough, shortness of breath,no wheezing or hemoptysis.  CARDIOVASCULAR: No chest pain, orthopnea, edema.  GASTROINTESTINAL: No nausea, vomiting, diarrhea or abdominal pain.  GENITOURINARY: No dysuria, hematuria.  ENDOCRINE: No polyuria, nocturia,  HEMATOLOGY: No anemia, easy bruising or bleeding SKIN: No rash or lesion. MUSCULOSKELETAL: No joint pain or arthritis.   NEUROLOGIC: No tingling, numbness, weakness.  PSYCHIATRY: No anxiety or depression.   ROS  DRUG ALLERGIES:   Allergies  Allergen Reactions  . Sertraline Hcl Other (See Comments)    Cloudy thoughts, malaise    VITALS:  Blood pressure (!) 108/54, pulse 96, temperature 98.2 F (36.8 C), temperature source Oral, resp. rate 18, height _0  (1.549 m), weight 78 kg (172 lb), SpO2 95 %.  PHYSICAL EXAMINATION:  GENERAL:  62 y.o.-year-old patient lying in the bed with no acute distress.  EYES: Pupils equal, round, reactive to light and accommodation. No scleral icterus. Extraocular muscles intact.  HEENT: Head atraumatic, normocephalic. Oropharynx and nasopharynx clear.  NECK:  Supple, no jugular venous distention. No thyroid enlargement, no tenderness.  LUNGS: Normal breath sounds bilaterally, no wheezing, some crepitation. No use of accessory muscles of respiration. On HFNC oxygen. CARDIOVASCULAR: S1, S2 normal. No murmurs, rubs, or gallops.  ABDOMEN: Soft, nontender,  nondistended. Bowel sounds present. No organomegaly or mass.  EXTREMITIES: No pedal edema, cyanosis, or clubbing.  NEUROLOGIC: Cranial nerves II through XII are intact. Muscle strength 4/5 in all extremities. Sensation intact. Gait not checked.  PSYCHIATRIC: The patient is alert and oriented x 3.  SKIN: No obvious rash, lesion, or ulcer.   Physical Exam LABORATORY PANEL:   CBC Recent Labs  Lab 04/20/18 0903  WBC 13.8*  HGB 9.7*  HCT 32.5*  PLT 245   ------------------------------------------------------------------------------------------------------------------  Chemistries  Recent Labs  Lab 04/20/18 0347 04/20/18 0903  NA  --  133*  K  --  4.1  CL  --  96*  CO2  --  24  GLUCOSE  --  150*  BUN  --  46*  CREATININE  --  2.41*  CALCIUM  --  8.4*  MG 1.4*  --   AST  --  2,201*  ALT  --  966*  ALKPHOS  --  65  BILITOT  --  1.8*   ------------------------------------------------------------------------------------------------------------------  Cardiac Enzymes Recent Labs  Lab 04/20/18 1141 04/20/18 1924  TROPONINI 1.79* 1.56*   ------------------------------------------------------------------------------------------------------------------  RADIOLOGY:  Dg Abd 1 View  Result Date: 04/20/2018 CLINICAL DATA:  Distended urinary bladder. EXAM: ABDOMEN - 1 VIEW COMPARISON:  CT scan of January 28, 2017. FINDINGS: The bowel gas pattern is normal. No radio-opaque calculi or other significant radiographic abnormality are seen. IMPRESSION: No definite evidence of bowel obstruction or ileus. Electronically Signed   By: Marijo Conception, M.D.   On: 04/20/2018 17:06   Nm Pulmonary Perf And Vent  Result Date: 04/20/2018 CLINICAL DATA:  COPD. Short of  breath. Concern for pulmonary embolism. High pretest probability EXAM: NUCLEAR MEDICINE VENTILATION - PERFUSION LUNG SCAN TECHNIQUE: Ventilation images were obtained in multiple projections using inhaled aerosol Tc-14mDTPA. Perfusion  images were obtained in multiple projections after intravenous injection of Tc-960mAA. RADIOPHARMACEUTICALS:  32.9 mCi of Tc-9926mPA aerosol inhalation and 4.36 mCi Tc99m71m IV COMPARISON:  6 chest radiograph 04/19/2018 FINDINGS: Ventilation: No focal ventilation defect. Perfusion: No wedge shaped peripheral perfusion defects to suggest acute pulmonary embolism. IMPRESSION: No evidence acute pulmonary embolism. Electronically Signed   By: StewSuzy Bouchard.   On: 04/20/2018 12:27   Dg Chest Portable 1 View  Result Date: 04/19/2018 CLINICAL DATA:  Acute onset shortness of breath. EXAM: PORTABLE CHEST 1 VIEW COMPARISON:  CT 04/13/2018 FINDINGS: Borderline cardiomegaly with aortic atherosclerosis. No aneurysm noted. Prominence in the region of the left atrial appendage is likely related to the patient's known dilatation of the main pulmonary artery likely reflecting chronic pulmonary hypertension. Lungs are hyperinflated without pulmonary consolidation or effusion. No pneumothorax. No acute osseous abnormality. IMPRESSION: Borderline cardiomegaly with aortic atherosclerosis. Hyperinflated lungs with prominence main pulmonary artery likely reflecting chronic pulmonary hypertension and COPD. Electronically Signed   By: DaviAshley Royalty.   On: 04/19/2018 23:02   Us L[place]er Doppler  Result Date: 04/20/2018 CLINICAL DATA:  62 y60r old female with elevated liver enzymes. EXAM: ULTRASOUND ABDOMEN LIMITED RIGHT UPPER QUADRANT AND DOPPLER. COMPARISON:  Ultrasound dated 01/28/2017 and CT dated 01/28/2017 FINDINGS: Gallbladder: There is a nonmobile and nonshadowing 6 mm echogenic focus within the gallbladder which may represent a polyp versus a nonshadowing small stone. The gallbladder wall is thickened and mildly edematous measuring 7 mm in thickness. No pericholecystic fluid. Negative sonographic Murphy's sign. Common bile duct: Diameter: 3 mm Liver: Mildly coarsened liver echotexture. The main portal vein is patent  with hepatopetal flow. The portal vein diameter is 7 mm. The velocities in the portal vein are 34 cm/s, 34 cm/second, and 46 centimeter/second (proximally, mid, and distally respectively). The velocity within the right portal vein is 38 centimeter/second and in the left portal vein 29 centimeter/second. There is pulsatility of the portal vein which may be related to underlying liver disease or right heart dysfunction. The hepatic veins are patent as visualized with somewhat turbulent flow which may be related to underlying right heart dysfunction. The velocities in the right, middle, and left hepatic veins are 38, 29, and 29 centimeter/second respectively. The intrahepatic IVC is 2.2 cm in diameter. Evaluation of the IVC is limited as the patient is on non-rebreather. The pack artery is patent as visualized with velocity of 76 centimeter/second. The visualized splenic vein appears patent with appropriate flow direction and velocity of 13 centimeter/second. The spleen measures 10.0 x 7.4 x 4.8 cm for a volume of 186 cc. No ascites. IMPRESSION: 1. Mildly coarsened liver echotexture. 2. A 6 mm gallbladder polyp versus a nonshadowing stone. Diffusely thickened gallbladder wall may be chronic or related to underlying liver disease. Clinical correlation is recommended. A hepatobiliary scintigraphy may provide better evaluation of the gallbladder if there is a high clinical concern for acute cholecystitis . 3. Patent main portal vein with hepatopetal flow. 4. No ascites. Electronically Signed   By: ArasAnner Crete.   On: 04/20/2018 03:33   Us [place]omen Limited Ruq  Result Date: 04/20/2018 CLINICAL DATA:  62 y38r old female with elevated liver enzymes. EXAM: ULTRASOUND ABDOMEN LIMITED RIGHT UPPER QUADRANT AND DOPPLER. COMPARISON:  Ultrasound dated 01/28/2017 and CT dated 01/28/2017 FINDINGS: Gallbladder: There is a nonmobile  and nonshadowing 6 mm echogenic focus within the gallbladder which may represent a polyp  versus a nonshadowing small stone. The gallbladder wall is thickened and mildly edematous measuring 7 mm in thickness. No pericholecystic fluid. Negative sonographic Murphy's sign. Common bile duct: Diameter: 3 mm Liver: Mildly coarsened liver echotexture. The main portal vein is patent with hepatopetal flow. The portal vein diameter is 7 mm. The velocities in the portal vein are 34 cm/s, 34 cm/second, and 46 centimeter/second (proximally, mid, and distally respectively). The velocity within the right portal vein is 38 centimeter/second and in the left portal vein 29 centimeter/second. There is pulsatility of the portal vein which may be related to underlying liver disease or right heart dysfunction. The hepatic veins are patent as visualized with somewhat turbulent flow which may be related to underlying right heart dysfunction. The velocities in the right, middle, and left hepatic veins are 38, 29, and 29 centimeter/second respectively. The intrahepatic IVC is 2.2 cm in diameter. Evaluation of the IVC is limited as the patient is on non-rebreather. The pack artery is patent as visualized with velocity of 76 centimeter/second. The visualized splenic vein appears patent with appropriate flow direction and velocity of 13 centimeter/second. The spleen measures 10.0 x 7.4 x 4.8 cm for a volume of 186 cc. No ascites. IMPRESSION: 1. Mildly coarsened liver echotexture. 2. A 6 mm gallbladder polyp versus a nonshadowing stone. Diffusely thickened gallbladder wall may be chronic or related to underlying liver disease. Clinical correlation is recommended. A hepatobiliary scintigraphy may provide better evaluation of the gallbladder if there is a high clinical concern for acute cholecystitis . 3. Patent main portal vein with hepatopetal flow. 4. No ascites. Electronically Signed   By: Anner Crete M.D.   On: 04/20/2018 03:33    ASSESSMENT AND PLAN:   Active Problems:   Acute renal failure superimposed on stage 4  chronic kidney disease (HCC)   Pulmonary hypertension (HCC)   Acute hypoxemic respiratory failure (HCC)   Elevated liver enzymes   Acute on chronic respiratory failure with hypoxia (HCC)   Elevated troponin   Acute on chronic right-sided heart failure (HCC)  * Ac on Ch hypoxic respi failure   Due to Severe pulm hypertension   Pneumonia can not be ruled out.    Will cont Nebs, Antibiotics, sildenafil.   pulm consult   Try to taper oxygen. At baseline 4 ltr at home.  * Elevated troponin      May be demand ischemia   Can not r/o NSTEMI yet, so on heparine drip.   Cardio consult appreciated, stopped heparine drip.   No cath due to renal failure.  * CKD stage 3   Appears stable  * elevated LFTs   Likely due to liver congestion due to Pulm htn   Monitor. Ordered hepatitis and GI consult.  ordered other autoimmune work up by him.   Check INR daily.  * Iron deficiency anemia   May need replacement iron on discharge.  * Hypertension   Metoprolol. Enalapril.- d/c enalapril due to hypotension.  All the records are reviewed and case discussed with Care Management/Social Workerr. Management plans discussed with the patient, family and they are in agreement.  CODE STATUS: Full.  TOTAL TIME TAKING CARE OF THIS PATIENT: 35 minutes.   Discussed with her sister in room.  POSSIBLE D/C IN 1-2 DAYS, DEPENDING ON CLINICAL CONDITION.   Vaughan Basta M.D on 04/21/2018   Between 7am to 6pm - Pager - [phone]  After  6pm go to [email or web address] - password [email or web address] [facility]  Office  [phone]  CC: Primary care physician; Venia Carbon, MD  Note: This dictation was prepared with Dragon dictation along with smaller phrase technology. Any transcriptional errors that result from this process are unintentional.

## 2018-04-21 NOTE — Progress Notes (Signed)
* [name]     Assessment and Plan:   Acute on chronic hypoxic respiratory failure. Severe pulmonary hypertension. Acute elevation of liver enzymes, acute hepatitis, complicating chronic hepatopathy from passive congestion.  Doubtful that the patient's pulmonary hypertension is causing this acute elevation of liver enzymes.  - Continue to follow liver enzymes, rule out other causes of hepatitis, recommend gastroenterology consult, ultrasound of the liver to look for evidence of cirrhosis/thrombosis. - Wean down oxygen as tolerated, discussed with nurse, patient is currently on 35% high flow, will try to transition to nasal cannula with oxygen saturation of 88% or better. - Patient may benefit from transfer to tertiary care center for further management of pulmonary hypertension, if not improving.    Date: 04/21/2018  MRN# [id number] Lindsay Harmon [birth year]   Lindsay Harmon is a 62 y.o. old female seen in follow up for chief complaint of  Chief Complaint  Patient presents with  . Weakness  . Hypotension  . Shortness of Breath     HPI:   Patient feels that her breathing has improved since yesterday.  She remains on 35% high flow.  Medication:    Current Facility-Administered Medications:  .  aspirin chewable tablet 81 mg, 81 mg, Oral, Daily, Sridharan, Prasanna, MD, 81 mg at 04/21/18 0915 .  azithromycin (ZITHROMAX) 500 mg in sodium chloride 0.9 % 250 mL IVPB, 500 mg, Intravenous, Q24H, Vaughan Basta, MD, Stopped at 04/20/18 1644 .  bisacodyl (DULCOLAX) EC tablet 5 mg, 5 mg, Oral, Daily PRN, Jodell Cipro, Prasanna, MD .  enalapril (VASOTEC) tablet 5 mg, 5 mg, Oral, Daily, Jodell Cipro, Prasanna, MD, 5 mg at 04/20/18 1013 .  guaiFENesin (ROBITUSSIN) 100 MG/5ML solution 100 mg, 5 mL, Oral, Q6H PRN, Vaughan Basta, MD, 100 mg at 04/20/18 1721 .  HYDROmorphone (DILAUDID) injection 0.5-1 mg, 0.5-1 mg, Intravenous, Q3H PRN, Jodell Cipro, Prasanna,  MD .  insulin aspart (novoLOG) injection 0-15 Units, 0-15 Units, Subcutaneous, TID WC, Arta Silence, MD, 3 Units at 04/20/18 1720 .  ipratropium-albuterol (DUONEB) 0.5-2.5 (3) MG/3ML nebulizer solution 3 mL, 3 mL, Nebulization, Q4H PRN, Jodell Cipro, Prasanna, MD .  menthol-cetylpyridinium (CEPACOL) lozenge 3 mg, 1 lozenge, Oral, PRN, Vaughan Basta, MD .  methadone (DOLOPHINE) tablet 20 mg, 20 mg, Oral, TID WC & HS, Vaughan Basta, MD, 20 mg at 04/21/18 0915 .  metoprolol tartrate (LOPRESSOR) tablet 12.5 mg, 12.5 mg, Oral, BID, Jodell Cipro, Prasanna, MD, 12.5 mg at 04/20/18 2215 .  nitroGLYCERIN (NITROSTAT) SL tablet 0.4 mg, 0.4 mg, Sublingual, Q5 min PRN, Jodell Cipro, Prasanna, MD .  ondansetron (ZOFRAN) tablet 4 mg, 4 mg, Oral, Q6H PRN, 4 mg at 04/20/18 1526 **OR** ondansetron (ZOFRAN) injection 4 mg, 4 mg, Intravenous, Q6H PRN, Jodell Cipro, Prasanna, MD .  pantoprazole (PROTONIX) EC tablet 40 mg, 40 mg, Oral, BID, Jodell Cipro, Prasanna, MD, 40 mg at 04/21/18 0915 .  piperacillin-tazobactam (ZOSYN) IVPB 3.375 g, 3.375 g, Intravenous, Q8H, Vaughan Basta, MD, Last Rate: 12.5 mL/hr at 04/21/18 0915, 3.375 g at 04/21/18 0915 .  senna-docusate (Senokot-S) tablet 1 tablet, 1 tablet, Oral, QHS PRN, Jodell Cipro, Prasanna, MD .  sodium chloride flush (NS) 0.9 % injection 3 mL, 3 mL, Intravenous, Q12H, Vaughan Basta, MD   Allergies:  Sertraline hcl  Review of Systems: Review of Systems:  Constitutional: Feels well. Cardiovascular: No chest pain.  Pulmonary: Denies dyspnea.   The remainder of systems were reviewed and were found to be negative other than what is documented in the HPI.   Physical Examination:  VS: BP 98/67 (BP Location: Left Arm)   Pulse 86   Temp 97.8 F (36.6 C) (Oral)   Resp 18   Ht _0  (1.549 m)   Wt 172 lb (78 kg)   SpO2 91%   BMI 32.50 kg/m   General Appearance: No distress  Neuro:without focal findings, mental status, speech normal,  alert and oriented HEENT: PERRLA, EOM intact Pulmonary: No wheezing, No rales  CardiovascularNormal S1,S2.  No m/r/g.  Abdomen: Benign, Soft, non-tender, No masses Renal:  No costovertebral tenderness  GU:  No performed at this time. Endoc: No evident thyromegaly, no signs of acromegaly or Cushing features Skin:   warm, no rashes, no ecchymosis  Extremities: normal, no cyanosis, clubbing.     LABORATORY PANEL:   CBC Recent Labs  Lab 04/20/18 0903  WBC 13.8*  HGB 9.7*  HCT 32.5*  PLT 245   ------------------------------------------------------------------------------------------------------------------  Chemistries  Recent Labs  Lab 04/20/18 0347 04/20/18 0903  NA  --  133*  K  --  4.1  CL  --  96*  CO2  --  24  GLUCOSE  --  150*  BUN  --  46*  CREATININE  --  2.41*  CALCIUM  --  8.4*  MG 1.4*  --   AST  --  2,201*  ALT  --  966*  ALKPHOS  --  65  BILITOT  --  1.8*   ------------------------------------------------------------------------------------------------------------------  Cardiac Enzymes Recent Labs  Lab 04/20/18 1924  TROPONINI 1.56*   ------------------------------------------------------------  RADIOLOGY:   No results found for this or any previous visit. Results for orders placed during the hospital encounter of 07/30/16  DG Chest 2 View   Narrative CLINICAL DATA:  Shortness of breath when walking for 2 weeks.  EXAM: CHEST  2 VIEW  COMPARISON:  06/02/2015  FINDINGS: There is hyperinflation of the lungs compatible with COPD. Heart and mediastinal contours are within normal limits. No focal opacities or effusions. No acute bony abnormality.  IMPRESSION: COPD.  No active disease.   Electronically Signed   By: Rolm Baptise M.D.   On: 07/30/2016 14:43    ------------------------------------------------------------------------------------------------------------------  Thank  you for allowing [facility] to assist in the care of your patient. Our recommendations are noted above.  Please contact us if we can be of further service.   Marda Stalker, M.D., F.C.C.P.  Board Certified in Internal Medicine, Pulmonary Medicine, [place], and Sleep Medicine.  [facility] Office Number: [phone]  04/21/2018

## 2018-04-21 NOTE — Progress Notes (Signed)
*  PRELIMINARY RESULTS* Echocardiogram 2D Echocardiogram has been performed.  Sherrie Sport 04/21/2018, 2:42 PM

## 2018-04-22 DIAGNOSIS — J9601 Acute respiratory failure with hypoxia: Secondary | ICD-10-CM

## 2018-04-22 DIAGNOSIS — Z515 Encounter for palliative care: Secondary | ICD-10-CM

## 2018-04-22 DIAGNOSIS — R110 Nausea: Secondary | ICD-10-CM

## 2018-04-22 DIAGNOSIS — Z7189 Other specified counseling: Secondary | ICD-10-CM

## 2018-04-22 LAB — AFP TUMOR MARKER: AFP, Serum, Tumor Marker: 3.4 ng/mL (ref 0.0–8.3)

## 2018-04-22 LAB — COMPREHENSIVE METABOLIC PANEL
ALT: 858 U/L — AB (ref 0–44)
AST: 926 U/L — AB (ref 15–41)
Albumin: 3.1 g/dL — ABNORMAL LOW (ref 3.5–5.0)
Alkaline Phosphatase: 83 U/L (ref 38–126)
Anion gap: 10 (ref 5–15)
BILIRUBIN TOTAL: 1.6 mg/dL — AB (ref 0.3–1.2)
BUN: 45 mg/dL — AB (ref 8–23)
CO2: 26 mmol/L (ref 22–32)
CREATININE: 2.08 mg/dL — AB (ref 0.44–1.00)
Calcium: 8.6 mg/dL — ABNORMAL LOW (ref 8.9–10.3)
Chloride: 98 mmol/L (ref 98–111)
GFR calc Af Amer: 28 mL/min — ABNORMAL LOW (ref >60)
GFR, EST NON AFRICAN AMERICAN: 24 mL/min — AB (ref >60)
Glucose, Bld: 130 mg/dL — ABNORMAL HIGH (ref 70–99)
Potassium: 4.3 mmol/L (ref 3.5–5.1)
Sodium: 134 mmol/L — ABNORMAL LOW (ref 135–145)
TOTAL PROTEIN: 6.5 g/dL (ref 6.5–8.1)

## 2018-04-22 LAB — HEPATITIS PANEL, ACUTE
HEP B C IGM: NEGATIVE
Hep A IgM: NEGATIVE
Hepatitis B Surface Ag: NEGATIVE

## 2018-04-22 LAB — CBC
HCT: 33.5 % — ABNORMAL LOW (ref 35.0–47.0)
Hemoglobin: 10 g/dL — ABNORMAL LOW (ref 12.0–16.0)
MCH: 20 pg — ABNORMAL LOW (ref 26.0–34.0)
MCHC: 29.7 g/dL — ABNORMAL LOW (ref 32.0–36.0)
MCV: 67.1 fL — ABNORMAL LOW (ref 80.0–100.0)
PLATELETS: 173 10*3/uL (ref 150–440)
RBC: 4.99 MIL/uL (ref 3.80–5.20)
RDW: 21.7 % — ABNORMAL HIGH (ref 11.5–14.5)
WBC: 9.5 10*3/uL (ref 3.6–11.0)

## 2018-04-22 LAB — IGG, IGA, IGM
IGG (IMMUNOGLOBIN G), SERUM: 1257 mg/dL (ref 700–1600)
IGM (IMMUNOGLOBULIN M), SRM: 118 mg/dL (ref 26–217)
IgA: 318 mg/dL (ref 87–352)

## 2018-04-22 LAB — ANTINUCLEAR ANTIBODIES, IFA: ANA Ab, IFA: NEGATIVE

## 2018-04-22 LAB — CULTURE, BLOOD (ROUTINE X 2): SPECIAL REQUESTS: ADEQUATE

## 2018-04-22 LAB — PROTIME-INR
INR: 2.44
Prothrombin Time: 26.3 s — ABNORMAL HIGH (ref 11.4–15.2)

## 2018-04-22 LAB — PROCALCITONIN: Procalcitonin: 0.4 ng/mL

## 2018-04-22 LAB — TROPONIN I: TROPONIN I: 0.45 ng/mL — AB (ref <0.03)

## 2018-04-22 LAB — GLUCOSE, CAPILLARY
GLUCOSE-CAPILLARY: 206 mg/dL — AB (ref 70–99)
Glucose-Capillary: 140 mg/dL — ABNORMAL HIGH (ref 70–99)
Glucose-Capillary: 193 mg/dL — ABNORMAL HIGH (ref 70–99)
Glucose-Capillary: 191 mg/dL — ABNORMAL HIGH (ref 70–99)

## 2018-04-22 LAB — CERULOPLASMIN: Ceruloplasmin: 36.6 mg/dL (ref 19.0–39.0)

## 2018-04-22 LAB — MITOCHONDRIAL ANTIBODIES

## 2018-04-22 LAB — ANTI-SMOOTH MUSCLE ANTIBODY, IGG: F-Actin IgG: 13 Units (ref 0–19)

## 2018-04-22 LAB — ALPHA-1-ANTITRYPSIN: A-1 Antitrypsin, Ser: 165 mg/dL (ref 90–200)

## 2018-04-22 MED ORDER — FERROUS SULFATE 325 (65 FE) MG PO TABS
325.0000 mg | ORAL_TABLET | Freq: Two times a day (BID) | ORAL | Status: DC
  Administered 2018-04-23 – 2018-04-24 (×2): 325 mg via ORAL
  Filled 2018-04-22 (×2): qty 1

## 2018-04-22 MED ORDER — FERROUS SULFATE 325 (65 FE) MG PO TABS
325.0000 mg | ORAL_TABLET | Freq: Two times a day (BID) | ORAL | Status: DC
  Administered 2018-04-22 – 2018-04-23 (×2): 325 mg via ORAL
  Filled 2018-04-22 (×2): qty 1

## 2018-04-22 MED ORDER — PROMETHAZINE HCL 25 MG/ML IJ SOLN
12.5000 mg | Freq: Three times a day (TID) | INTRAMUSCULAR | Status: DC | PRN
  Administered 2018-04-22 – 2018-04-23 (×2): 12.5 mg via INTRAVENOUS
  Filled 2018-04-22 (×2): qty 1

## 2018-04-22 MED ORDER — SILDENAFIL CITRATE 20 MG PO TABS
20.0000 mg | ORAL_TABLET | Freq: Three times a day (TID) | ORAL | Status: DC
  Filled 2018-04-22 (×3): qty 1

## 2018-04-22 NOTE — Progress Notes (Signed)
[place] [facility] [place] NAME: Lindsay Harmon    MR#:  [id number]  DATE OF BIRTH:  [birth year]  SUBJECTIVE:  CHIEF COMPLAINT:   Chief Complaint  Patient presents with  . Weakness  . Hypotension  . Shortness of Breath   Hx of COPD, Pulm Htn, on sildenafil- came with worsening SOB for last few days. Noted elevated Troponin, No chest pain.  improved, on 4 ltr nasal canula oxygen now.  REVIEW OF SYSTEMS:  CONSTITUTIONAL: No fever, fatigue or weakness.  EYES: No blurred or double vision.  EARS, NOSE, AND THROAT: No tinnitus or ear pain.  RESPIRATORY: have cough, shortness of breath,no wheezing or hemoptysis.  CARDIOVASCULAR: No chest pain, orthopnea, edema.  GASTROINTESTINAL: No nausea, vomiting, diarrhea or abdominal pain.  GENITOURINARY: No dysuria, hematuria.  ENDOCRINE: No polyuria, nocturia,  HEMATOLOGY: No anemia, easy bruising or bleeding SKIN: No rash or lesion. MUSCULOSKELETAL: No joint pain or arthritis.   NEUROLOGIC: No tingling, numbness, weakness.  PSYCHIATRY: No anxiety or depression.   ROS  DRUG ALLERGIES:   Allergies  Allergen Reactions  . Sertraline Hcl Other (See Comments)    Cloudy thoughts, malaise    VITALS:  Blood pressure 123/63, pulse 90, temperature 98.1 F (36.7 C), temperature source Oral, resp. rate 19, height _0  (1.549 m), weight 78.6 kg (173 lb 4.8 oz), SpO2 94 %.  PHYSICAL EXAMINATION:  GENERAL:  62 y.o.-year-old patient lying in the bed with no acute distress.  EYES: Pupils equal, round, reactive to light and accommodation. No scleral icterus. Extraocular muscles intact.  HEENT: Head atraumatic, normocephalic. Oropharynx and nasopharynx clear.  NECK:  Supple, no jugular venous distention. No thyroid enlargement, no tenderness.  LUNGS: Normal breath sounds bilaterally, no wheezing, some crepitation. No use of accessory muscles of respiration. On 4 ltr [place] oxygen. CARDIOVASCULAR: S1, S2 normal. No  murmurs, rubs, or gallops.  ABDOMEN: Soft, nontender, nondistended. Bowel sounds present. No organomegaly or mass.  EXTREMITIES: No pedal edema, cyanosis, or clubbing.  NEUROLOGIC: Cranial nerves II through XII are intact. Muscle strength 4/5 in all extremities. Sensation intact. Gait not checked.  PSYCHIATRIC: The patient is alert and oriented x 3.  SKIN: No obvious rash, lesion, or ulcer.   Physical Exam LABORATORY PANEL:   CBC Recent Labs  Lab 04/22/18 0501  WBC 9.5  HGB 10.0*  HCT 33.5*  PLT 173   ------------------------------------------------------------------------------------------------------------------  Chemistries  Recent Labs  Lab 04/20/18 0347  04/22/18 0501  NA  --    < > 134*  K  --    < > 4.3  CL  --    < > 98  CO2  --    < > 26  GLUCOSE  --    < > 130*  BUN  --    < > 45*  CREATININE  --    < > 2.08*  CALCIUM  --    < > 8.6*  MG 1.4*  --   --   AST  --    < > 926*  ALT  --    < > 858*  ALKPHOS  --    < > 83  BILITOT  --    < > 1.6*   < > = values in this interval not displayed.   ------------------------------------------------------------------------------------------------------------------  Cardiac Enzymes Recent Labs  Lab 04/20/18 1141 04/20/18 1924  TROPONINI 1.79* 1.56*   ------------------------------------------------------------------------------------------------------------------  RADIOLOGY:  Dg Abd 1 View  Result Date: 04/20/2018 CLINICAL DATA:  Distended urinary bladder. EXAM: ABDOMEN - 1 VIEW COMPARISON:  CT scan of January 28, 2017. FINDINGS: The bowel gas pattern is normal. No radio-opaque calculi or other significant radiographic abnormality are seen. IMPRESSION: No definite evidence of bowel obstruction or ileus. Electronically Signed   By: Marijo Conception, M.D.   On: 04/20/2018 17:06    ASSESSMENT AND PLAN:   Active Problems:   Acute renal failure superimposed on stage 4 chronic kidney disease (HCC)   Pulmonary  hypertension (HCC)   Acute hypoxemic respiratory failure (HCC)   Elevated liver enzymes   Acute on chronic respiratory failure with hypoxia (HCC)   Elevated troponin   Acute on chronic right-sided heart failure (HCC)  * Ac on Ch hypoxic respi failure   Due to Severe pulm hypertension   Pneumonia can not be ruled out.    Will cont Nebs, Antibiotics, sildenafil.   pulm consult   Try to taper oxygen. At baseline 4 ltr at home.   procalcitonin is high, but pt have heart , lung and CKD issues.    She is clinically improving, will stop Abx. Pneumonia ruled out.  * Elevated troponin      May be demand ischemia   Can not r/o NSTEMI yet, so on heparine drip.   Cardio consult appreciated, stopped heparine drip.   No cath due to renal failure.  * CKD stage 3   Appears stable  * elevated LFTs   Likely due to liver congestion due to Pulm htn   Monitor. Ordered hepatitis and GI consult.  ordered other autoimmune work up by him.   Check INR daily.  * Iron deficiency anemia   May need replacement iron on discharge.  * Hypertension   Metoprolol. Enalapril.- d/c enalapril due to hypotension.  All the records are reviewed and case discussed with Care Management/Social Workerr. Management plans discussed with the patient, family and they are in agreement.  CODE STATUS: Full.  TOTAL TIME TAKING CARE OF THIS PATIENT: 35 minutes.   Discussed with her sister in room.  POSSIBLE D/C IN 1-2 DAYS, DEPENDING ON CLINICAL CONDITION.   Vaughan Basta M.D on 04/22/2018   Between 7am to 6pm - Pager - [phone]  After 6pm go to [email or web address] - password [email or web address] [facility]  Office  [phone]  CC: Primary care physician; Venia Carbon, MD  Note: This dictation was prepared with Dragon dictation along with smaller phrase technology. Any transcriptional errors that result from this process are unintentional.

## 2018-04-22 NOTE — Consult Note (Signed)
   [facility] CM Inpatient Consult   04/22/2018  Lindsay Harmon 08-Apr-1956 [id number]   Referral received by inpatient case manager for [place] Management services and post hospital discharge follow up related to a diagnosis of COPD, HF and DM. Patient was evaluated for community based chronic disease management services with [facility] care Management Program as a benefit of patient's [employer] Medicare. Met with the patient at the bedside to explain [place] Management services. Patient endorses her primary care provider to be Dr. Silvio Pate, whose office is listed as completing their own transition of care calls. Patient states she wears O2 at home. Patient stated she is unsure of her discharge plan at this moment and relays she has been very sick during this hospitalization.  Verbal consent received for services. Patient gave [phone] as the best number to reach her. She also gave verbal permission to call her son [name] at [phone] if she can not be reached. Patient will receive post hospital discharge calls and be evaluated for monthly home visits. [facility] Management services do not interfere with or replace any services arranged by the inpatient care management team. RNCM left contact information and [facility] literature at the bedside. Made inpatient RNCM aware  [facility] will be following for care management. For additional questions please contact:   Neymar Dowe RN, [facility] Liaison  [phone]) Business Mobile [phone]) Toll free office

## 2018-04-22 NOTE — Consult Note (Signed)
Consultation Note Date: 04/22/2018   Patient Name: Lindsay Harmon  DOB: January 08, 1956  MRN: [id number]  Age / Sex: 62 y.o., female  PCP: Venia Carbon, MD Referring Physician: Vaughan Basta, *  Reason for Consultation: Establishing goals of care  HPI/Patient Profile: 62 y.o. female  with past medical history of severe COPD, pulm htn,HTN, HLD, T2DM, GERD, CKD 4, remote ETOH abuse, and IBS admitted on 04/19/2018 with nausea, malaise, and worsening shortness of breath. Found to have acute on chronic respiratory failure r/t pulm htn, COPD and right heart failure. Also found to have elevated liver enzymes, acute kidney injury, and elevated troponin.  Pt on 4L O2 baseline. PMT consulted for [place].  Clinical Assessment and Goals of Care: I have reviewed medical records including EPIC notes, labs and imaging, received report from Dr. Anselm Jungling, assessed the patient and then met at the bedside along with patient, her sister, and her mother  to discuss diagnosis prognosis, [place], EOL wishes, disposition and options.  Patient wanted her son to be involved in out meeting; however, he was unable to attend. Spoke with him over phone to discuss his mother's status and wishes. He was grateful and saddened by information. He plans to come to the hospital this evening to discuss further with his mother.   I introduced Palliative Medicine as specialized medical care for people living with serious illness. It focuses on providing relief from the symptoms and stress of a serious illness. The goal is to improve quality of life for both the patient and the family.  We discussed a brief life review of the patient. She worked as an Radio broadcast assistant at [employer]. She has 1 son. She lives with her mother. Her sister is nearby and helps her often.   As far as functional and nutritional status, patient and family describe a decline. She requires a walker for  ambulation. She is easily fatigued - tiring for her to walk any further than to the bathroom and back. Appetite has declined.     We discussed her current illness and what it means in the larger context of her on-going co-morbidities.  Natural disease trajectory and expectations at EOL were discussed. Specifically we discussed multiorgan failure and risk for acute events and sudden death. Patient tells me she did not realize how ill she was - has not thought much about what she would want if she were to acutely decompensate. She shares with me how overwhelmed she is with the decisions she is facing.   I attempted to elicit values and goals of care important to the patient.  Her family is most important to her. She is concerned about how her decisions will affect them. She wants to include them in decision making.   The difference between aggressive medical intervention and comfort care was considered in light of the patient's goals of care. The patient is unsure in how to proceed - wants to think about it and discuss with her family further.   Advanced directives, concepts specific to code status, artifical feeding and hydration, and rehospitalization were considered and discussed. She does share that she would not want to be on a ventilator "long-term" but unsure about short-term. I shared my concerns about poor prognosis.  We discussed likeliness of repeated hospitalizations.   I introduced a MOST form and explained the options - she will review.   Hospice and Palliative Care services outpatient were explained and offered. At the least, patient should be followed by palliative.  We discussed hospice philosophy of care. Wants to think about it more.   Questions and concerns were addressed. The family was encouraged to call with questions or concerns.   Primary Decision Maker PATIENT    SUMMARY OF RECOMMENDATIONS   - patient overwhelmed with news of poor prognosis, needs time to works through  decisions and discuss with her family -told her I would follow up tomorrow to further discuss goals of care re code status, MOST form, disposition  Code Status/Advance Care Planning:  Full code   Symptom Management:   Throughout conversation, patient complains of "waves of nausea" - at times she is unable to speak d/t nausea ?cardiac related - discussed with Dr. Rhea Pink and Dr. Anselm Jungling  Palliative Prophylaxis:   Aspiration, Bowel Regimen, Delirium Protocol, Frequent Pain Assessment, Oral Care and Turn Reposition  Additional Recommendations (Limitations, Scope, Preferences):  Full Scope Treatment  Psycho-social/Spiritual:   Desire for further Chaplaincy support:yes  Additional Recommendations: Education on Hospice  Prognosis:   Unable to determine poor prognosis r/t severe COPD, pulmonary htn, kidney disease, right sided heart failure  Discharge Planning: Home with Palliative Services  At least     Primary Diagnoses: Present on Admission: . Acute hypoxemic respiratory failure ([place])   I have reviewed the medical record, interviewed the patient and family, and examined the patient. The following aspects are pertinent.  Past Medical History:  Diagnosis Date  . Allergy   . Aortic atherosclerosis ([place])   . Arthritis   . CKD (chronic kidney disease), stage IV ([place])   . COPD (chronic obstructive pulmonary disease) ([place])    a. 11/2014 PFT's: FEV1 0.87 (38%), FVC 1.41 (48%), FEF 25-75 0.41 (19%). Unable to perform DLCO; b. 12/2017 Simple spirometry ration 67%. FEV1 1.21 L+53% predicted. FVC 1.79L 61% predicted.  . Coronary artery calcification seen on CT scan    a. 03/2017 CT Chest.  . Depression   . Diabetic neuropathy ([place])   . Esophageal stricture   . Familial hematuria   . GERD (gastroesophageal reflux disease)   . Hyperlipidemia   . Hypertension   . IBS (irritable bowel syndrome)   . Nephrolithiasis   . NIDDM (non-insulin dependent diabetes mellitus)   .  Obesity, unspecified   . Pulmonary hypertension ([place])    a. WHO Group 3; b. 10/2016 Echo: EF 55-60%, no rwma, Gr1 DD, mild to mod AS, mean grad 29mHg, mildly dil RV w/ mildly reduced RV fxn, mildly dil RA. PASP 1159mg; c. 12/2017 RHC: RA 11, RV 92/12, PA 92/33(57), PCWP 15, Fick CO/CI 5.9/3.3. PVR 7.2 WU. Ao 93%, PA 62/64%.  . Marland KitchenVD (peripheral vascular disease) ([facility]   Social History   Socioeconomic History  . Marital status: Legally Separated    Spouse name: Not on file  . Number of children: 1  . Years of education: Not on file  . Highest education level: Not on file  Occupational History  . 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 Financial resource strain: Not on file  . Food insecurity:    Worry: Not on file    Inability: Not on file  . Transportation needs:    Medical: Not on file    Non-medical: Not on file  Tobacco Use  . Smoking status: Former Smoker    Packs/day: 1.50    Years: 20.00    Pack years: 30.00    Types: Cigarettes    Last attempt to quit: 01/04/2009  Years since quitting: 9.3  . Smokeless tobacco: Never Used  Substance and Sexual Activity  . Alcohol use: No    Alcohol/week: 0.0 oz    Comment: heavy in the past  . Drug use: No  . Sexual activity: Never  Lifestyle  . Physical activity:    Days per week: Not on file    Minutes per session: Not on file  . Stress: Not on file  Relationships  . Social connections:    Talks on phone: Not on file    Gets together: Not on file    Attends religious service: Not on file    Active member of club or organization: Not on file    Attends meetings of clubs or organizations: Not on file    Relationship status: Not on file  Other Topics Concern  . Not on file  Social History Narrative   No living will   Son [name] (then mom) should make decisions for her if she is unable.    Would accept resuscitation attempts but no prolonged ventilation   Not sure about  tube feeds but probably wouldn't want them if cognitively unaware   Family History  Problem Relation Age of Onset  . Diabetes Mother   . Cancer Mother        ovarian,melanoma  . Allergies Father   . Cancer Father        lung  . Cancer Maternal Grandmother        uterine  . Emphysema Paternal Grandfather   . Allergies Sister   . Breast cancer Neg Hx    Scheduled Meds: . aspirin  81 mg Oral Daily  . insulin aspart  0-15 Units Subcutaneous TID WC  . methadone  20 mg Oral TID WC & HS  . metoprolol tartrate  12.5 mg Oral BID  . pantoprazole  40 mg Oral BID  . sildenafil  20 mg Oral TID  . sodium chloride flush  3 mL Intravenous Q12H   Continuous Infusions: . azithromycin Stopped (04/21/18 1516)  . piperacillin-tazobactam (ZOSYN)  IV 3.375 g (04/22/18 0935)   PRN Meds:.bisacodyl, guaiFENesin, HYDROmorphone (DILAUDID) injection, ipratropium-albuterol, menthol-cetylpyridinium, nitroGLYCERIN, ondansetron **OR** ondansetron (ZOFRAN) IV, senna-docusate Allergies  Allergen Reactions  . Sertraline Hcl Other (See Comments)    Cloudy thoughts, malaise   Review of Systems  Constitutional: Positive for activity change and fatigue.  Respiratory: Negative for shortness of breath.   Cardiovascular: Negative for chest pain.  Gastrointestinal: Positive for nausea. Negative for abdominal pain.  Neurological: Positive for weakness.    Physical Exam  Constitutional: She is oriented to person, place, and time. She appears ill. No distress.  HENT:  Head: Normocephalic and atraumatic.  Cardiovascular: Normal rate and regular rhythm.  Pulmonary/Chest: Effort normal and breath sounds normal. No accessory muscle usage. No respiratory distress.  Abdominal: Soft. Bowel sounds are normal.  Neurological: She is alert and oriented to person, place, and time.  Skin: Skin is warm and dry.    Vital Signs: BP 123/63 (BP Location: Left Arm)   Pulse 90   Temp 98.1 F (36.7 C) (Oral)   Resp 19   Ht  5' 1" (1.549 m)   Wt 78.6 kg (173 lb 4.8 oz)   SpO2 94%   BMI 32.74 kg/m  Pain Scale: 0-10 POSS *See Group Information*: S-Acceptable,Sleep, easy to arouse Pain Score: 0-No pain   SpO2: SpO2: 94 % O2 Device:SpO2: 94 % O2 Flow Rate: .O2 Flow Rate (L/min): 4 L/min  IO: Intake/output  summary:   Intake/Output Summary (Last 24 hours) at 04/22/2018 1212 Last data filed at 04/22/2018 1037 Gross per 24 hour  Intake 497.92 ml  Output 1100 ml  Net -602.08 ml    LBM: Last BM Date: 04/22/18 Baseline Weight: Weight: 74.8 kg (165 lb) Most recent weight: Weight: 78.6 kg (173 lb 4.8 oz)     Palliative Assessment/Data: PPS 50%     Time In: 1300 Time Out: 1530 Time Total: 150 minutes Greater than 50%  of this time was spent counseling and coordinating care related to the above assessment and plan.  Juel Burrow, DNP, AGNP-C Palliative Medicine Team [phone] Pager: [phone]

## 2018-04-22 NOTE — Progress Notes (Signed)
Chaplain responded to an OR for an AD. Pt ws educated about HCPOA and LW. Pt was not decisive of what to do. Chaplain let her know she could consult with son(HCPOA). She is legally separated from her spouse. Pt is cordial and attentive. Chaplain prayed for her mother. Sister, son and grandson(Lindsay Harmon) and Pts healing.  Follow up: Chaplain will follow    04/22/18 1000  Clinical Encounter Type  Visited With Patient  Visit Type Initial  Referral From Physician  Spiritual Encounters  Spiritual Needs Brochure;Prayer

## 2018-04-22 NOTE — Progress Notes (Signed)
* [facility]     Assessment and Plan:   Acute on chronic hypoxic respiratory failure, appears improving, now on baseline oxygen.  Severe pulmonary hypertension. Acute elevation of liver enzymes, acute hepatitis, complicating chronic hepatopathy from passive congestion.  Doubtful that the patient's pulmonary hypertension is causing this acute elevation of liver enzymes.  - Continue to follow liver enzymes, gastro following.  - Wean down oxygen as tolerated  --Restart revatio.  --Follow up with Dr. Lake Bells in 2-4 weeks upon discharge.    Date: 04/22/2018  MRN# [id number] Lindsay Harmon [birth year]   Lindsay Harmon is a 62 y.o. old female seen in follow up for chief complaint of  Chief Complaint  Patient presents with  . Weakness  . Hypotension  . Shortness of Breath   Brief history. The patient is a 62 year old female with severe COPD, obstructive sleep apnea, pulmonary hypertension.  Subjective  Patient feels that her breathing has improved since yesterday here has been weaned down to nasal cannula, currently at 4 L.  Medication:    Current Facility-Administered Medications:  .  aspirin chewable tablet 81 mg, 81 mg, Oral, Daily, Jodell Cipro, Prasanna, MD, 81 mg at 04/22/18 0935 .  azithromycin (ZITHROMAX) 500 mg in sodium chloride 0.9 % 250 mL IVPB, 500 mg, Intravenous, Q24H, Vaughan Basta, MD, Stopped at 04/21/18 1516 .  bisacodyl (DULCOLAX) EC tablet 5 mg, 5 mg, Oral, Daily PRN, Jodell Cipro, Prasanna, MD .  guaiFENesin (ROBITUSSIN) 100 MG/5ML solution 100 mg, 5 mL, Oral, Q6H PRN, Vaughan Basta, MD, 100 mg at 04/20/18 1721 .  HYDROmorphone (DILAUDID) injection 0.5-1 mg, 0.5-1 mg, Intravenous, Q3H PRN, Jodell Cipro, Prasanna, MD .  insulin aspart (novoLOG) injection 0-15 Units, 0-15 Units, Subcutaneous, TID WC, Arta Silence, MD, 2 Units at 04/22/18 0930 .  ipratropium-albuterol (DUONEB) 0.5-2.5 (3) MG/3ML nebulizer solution 3 mL, 3 mL,  Nebulization, Q4H PRN, Jodell Cipro, Prasanna, MD .  menthol-cetylpyridinium (CEPACOL) lozenge 3 mg, 1 lozenge, Oral, PRN, Vaughan Basta, MD .  methadone (DOLOPHINE) tablet 20 mg, 20 mg, Oral, TID WC & HS, Vaughan Basta, MD, 20 mg at 04/22/18 0935 .  metoprolol tartrate (LOPRESSOR) tablet 12.5 mg, 12.5 mg, Oral, BID, Jodell Cipro, Prasanna, MD, 12.5 mg at 04/22/18 0935 .  nitroGLYCERIN (NITROSTAT) SL tablet 0.4 mg, 0.4 mg, Sublingual, Q5 min PRN, Jodell Cipro, Prasanna, MD .  ondansetron (ZOFRAN) tablet 4 mg, 4 mg, Oral, Q6H PRN, 4 mg at 04/20/18 1526 **OR** ondansetron (ZOFRAN) injection 4 mg, 4 mg, Intravenous, Q6H PRN, Jodell Cipro, Prasanna, MD, 4 mg at 04/22/18 1037 .  pantoprazole (PROTONIX) EC tablet 40 mg, 40 mg, Oral, BID, Jodell Cipro, Prasanna, MD, 40 mg at 04/22/18 0935 .  piperacillin-tazobactam (ZOSYN) IVPB 3.375 g, 3.375 g, Intravenous, Q8H, Vaughan Basta, MD, Last Rate: 12.5 mL/hr at 04/22/18 0935, 3.375 g at 04/22/18 0935 .  senna-docusate (Senokot-S) tablet 1 tablet, 1 tablet, Oral, QHS PRN, Jodell Cipro, Prasanna, MD .  sodium chloride flush (NS) 0.9 % injection 3 mL, 3 mL, Intravenous, Q12H, Vaughan Basta, MD, 3 mL at 04/22/18 0936   Allergies:  Sertraline hcl  Review of Systems: Review of Systems:  Constitutional: Feels well. Cardiovascular: No chest pain.  Pulmonary: Denies dyspnea.   The remainder of systems were reviewed and were found to be negative other than what is documented in the HPI.   Physical Examination:   VS: BP 123/63 (BP Location: Left Arm)   Pulse 90   Temp 98.1 F (36.7 C) (Oral)   Resp 19   Ht _0  (  1.549 m)   Wt 173 lb 4.8 oz (78.6 kg)   SpO2 94%   BMI 32.74 kg/m   General Appearance: No distress  Neuro:without focal findings, mental status, speech normal, alert and oriented HEENT: PERRLA, EOM intact Pulmonary: No wheezing, No rales  CardiovascularNormal S1,S2.  No m/r/g.  Abdomen: Benign, Soft, non-tender, No  masses Renal:  No costovertebral tenderness  GU:  No performed at this time. Endoc: No evident thyromegaly, no signs of acromegaly or Cushing features Skin:   warm, no rashes, no ecchymosis  Extremities: normal, no cyanosis, clubbing.     LABORATORY PANEL:   CBC Recent Labs  Lab 04/22/18 0501  WBC 9.5  HGB 10.0*  HCT 33.5*  PLT 173   ------------------------------------------------------------------------------------------------------------------  Chemistries  Recent Labs  Lab 04/20/18 0347  04/22/18 0501  NA  --    < > 134*  K  --    < > 4.3  CL  --    < > 98  CO2  --    < > 26  GLUCOSE  --    < > 130*  BUN  --    < > 45*  CREATININE  --    < > 2.08*  CALCIUM  --    < > 8.6*  MG 1.4*  --   --   AST  --    < > 926*  ALT  --    < > 858*  ALKPHOS  --    < > 83  BILITOT  --    < > 1.6*   < > = values in this interval not displayed.   ------------------------------------------------------------------------------------------------------------------  Cardiac Enzymes Recent Labs  Lab 04/20/18 1924  TROPONINI 1.56*   ------------------------------------------------------------  RADIOLOGY:   No results found for this or any previous visit. Results for orders placed during the hospital encounter of 07/30/16  DG Chest 2 View   Narrative CLINICAL DATA:  Shortness of breath when walking for 2 weeks.  EXAM: CHEST  2 VIEW  COMPARISON:  06/02/2015  FINDINGS: There is hyperinflation of the lungs compatible with COPD. Heart and mediastinal contours are within normal limits. No focal opacities or effusions. No acute bony abnormality.  IMPRESSION: COPD.  No active disease.   Electronically Signed   By: Rolm Baptise M.D.   On: 07/30/2016 14:43    ------------------------------------------------------------------------------------------------------------------  Thank  you for allowing [facility] to assist in the care of your  patient. Our recommendations are noted above.  Please contact us if we can be of further service.   Marda Stalker, M.D., F.C.C.P.  Board Certified in Internal Medicine, Pulmonary Medicine, [place], and Sleep Medicine.  [facility] Office Number: [phone]  04/22/2018

## 2018-04-22 NOTE — Progress Notes (Signed)
Progress Note  Patient Name: Lindsay Harmon Date of Encounter: 04/22/2018  Primary Cardiologist: Bensimhon  Subjective   Feels significantly better this morning. SOB is at her approximate baseline. Now back to her baseline supplemental oxygen of 4 L via nasal cannula. No chest pain. TTE 04/21/2018 showed an EF of 65-70%, Gr1DD, ventricular septum shwoed diastolic falttening consistent with RV volume overload, moderate AS with a mean gradient of 28, calcified mitral valve, severely dilated RV with mildly reduced RVSF, severe TR directed eccentrically, PASP was severely increased and estimated to be 115 mmHg plus central venous/right atrial pressure. Labs this morning show an improving liver function, INR, SCr and stable HGB.   Inpatient Medications    Scheduled Meds: . aspirin  81 mg Oral Daily  . insulin aspart  0-15 Units Subcutaneous TID WC  . methadone  20 mg Oral TID WC & HS  . metoprolol tartrate  12.5 mg Oral BID  . pantoprazole  40 mg Oral BID  . sodium chloride flush  3 mL Intravenous Q12H   Continuous Infusions: . azithromycin Stopped (04/21/18 1516)  . piperacillin-tazobactam (ZOSYN)  IV 3.375 g (04/22/18 0935)   PRN Meds: bisacodyl, guaiFENesin, HYDROmorphone (DILAUDID) injection, ipratropium-albuterol, menthol-cetylpyridinium, nitroGLYCERIN, ondansetron **OR** ondansetron (ZOFRAN) IV, senna-docusate   Vital Signs    Vitals:   04/21/18 1938 04/22/18 0103 04/22/18 0322 04/22/18 0809  BP: 126/71  111/69 123/63  Pulse: (!) 106 (!) 104 95 90  Resp: _0 Temp: 98.3 F (36.8 C)  98.2 F (36.8 C) 98.1 F (36.7 C)  TempSrc: Oral  Oral Oral  SpO2: 93%  92% 94%  Weight:   173 lb 4.8 oz (78.6 kg)   Height:        Intake/Output Summary (Last 24 hours) at 04/22/2018 1006 Last data filed at 04/22/2018 0500 Gross per 24 hour  Intake 377.92 ml  Output 1800 ml  Net -1422.08 ml   Filed Weights   04/21/18 0203 04/21/18 0500 04/22/18 0322  Weight: 172 lb (78 kg)  172 lb (78 kg) 173 lb 4.8 oz (78.6 kg)    Telemetry    NSR with rare PAC/PVC - Personally Reviewed  ECG    n/a - Personally Reviewed  Physical Exam   GEN: No acute distress.   Neck: No JVD. Cardiac: RRR, III/VI systolic murmur RUSB, III/VI systolic murmur LLSB, no rubs, or gallops.  Respiratory: Diminsihed and coarse breath sounds bilaterally. Back to baseline supplemental oxygen of 4 L via nasal cannula.  GI: Soft, nontender, non-distended.   MS: No edema; No deformity. Neuro:  Alert and oriented x 3; Nonfocal.  Psych: Normal affect.  Labs    Chemistry Recent Labs  Lab 04/19/18 2214 04/20/18 0903 04/22/18 0501  NA 139 133* 134*  K 4.5 4.1 4.3  CL 95* 96* 98  CO2 19* 24 26  GLUCOSE 82 150* 130*  BUN 42* 46* 45*  CREATININE 2.78* 2.41* 2.08*  CALCIUM 9.4 8.4* 8.6*  PROT 7.3 6.3* 6.5  ALBUMIN 3.5 3.2* 3.1*  AST 1,643* 2,201* 926*  ALT 646* 966* 858*  ALKPHOS 80 65 83  BILITOT 2.6* 1.8* 1.6*  GFRNONAA 17* 20* 24*  GFRAA 20* 23* 28*  ANIONGAP 25* 13 10     Hematology Recent Labs  Lab 04/19/18 2214 04/20/18 0347 04/20/18 0903 04/22/18 0501  WBC 13.8*  --  13.8* 9.5  RBC 5.71* 5.86* 4.99 4.99  HGB 11.0*  --  9.7* 10.0*  HCT 38.8  --  32.5* 33.5*  MCV 67.8*  --  65.1* 67.1*  MCH 19.2*  --  19.5* 20.0*  MCHC 28.3*  --  30.0* 29.7*  RDW 21.7*  --  21.7* 21.7*  PLT 307  --  245 173    Cardiac Enzymes Recent Labs  Lab 04/19/18 2214 04/20/18 0512 04/20/18 1141 04/20/18 1924  TROPONINI 0.56* 1.44* 1.79* 1.56*   No results for input(s): TROPIPOC in the last 168 hours.   BNP Recent Labs  Lab 04/19/18 2215  BNP 1,679.0*     DDimer No results for input(s): DDIMER in the last 168 hours.   Radiology    Dg Abd 1 View  Result Date: 04/20/2018 IMPRESSION: No definite evidence of bowel obstruction or ileus. Electronically Signed   By: Marijo Conception, M.D.   On: 04/20/2018 17:06   Nm Pulmonary Perf And Vent  Result Date: 04/20/2018 IMPRESSION: No  evidence acute pulmonary embolism. Electronically Signed   By: Suzy Bouchard M.D.   On: 04/20/2018 12:27    Cardiac Studies   TTE 04/21/2018: Study Conclusions  - Left ventricle: Systolic function was vigorous. The estimated   ejection fraction was in the range of 65% to 70%. Doppler   parameters are consistent with abnormal left ventricular   relaxation (grade 1 diastolic dysfunction). - Ventricular septum: The contour showed diastolic flattening.   These changes are consistent with RV volume overload. - Aortic valve: Cusp separation was reduced. Transvalvular velocity   was increased. There was moderate stenosis. Mean gradient (S): 28   mm Hg. - Mitral valve: Calcified annulus. - Right ventricle: The cavity size was severely dilated. Wall   thickness was normal. Systolic function was mildly reduced. - Right atrium: The atrium was moderately dilated. - Tricuspid valve: There was severe regurgitation directed   eccentrically. - Pulmonary arteries: Systolic pressure was severely increased,   estimated to be 115 mm Hg plus central venous/right atrial   pressure.  Patient Profile     62 y.o. female with history of severe COPD and PAH, HTN, HL, NIDDM, GERD, CKD IV, and IBS who is being seen today for the evaluation of hypoxia and right heart failure with multiorgan failure.   Assessment & Plan    1. Acute on chronic respiratory failure with hypoxia: -Likely multifactorial including severe PAH, COPD and RV failure -Now back on baseline 4 L supplemental oxygen via nasal cannula -Feels at her baseline -Ambulate to assess for worsening SOB, if worsening symptoms, would recommend transfer to [facility] if she continues to want aggressive interventions   2. PAH: -Worsening as noted on echo as above -Revatio has been discontinued after discussion with Dr. Haroldine Laws with no plans to resume -Remarkably, she has had a dramatic improvement in her symptoms and labs over the past 24 hours. If  she continues to improve and labs stabilize/improve she may be able to follow up with the advanced heart service as an outpatient, otherwise we will need to consider transfer to [facility] this admission -It remains uncertain if she would like to undergo aggressive therapies such as inhaled NO, unlikely to ultimately drastically change her prognosis  -Coagulopathy precludes RHC at this time  3. Multiorgan failure/transaminitis/shock liver/acute on CKD stage IV: -Improving -Likely related to [place] and RV failure with hypotension augmented possibly by Revatio -Continue supportive care and close monitoring -If symptoms or objective findings worsen, she may require transfer to [facility], though for now, if she continues to improve like she has over the past 24 hours  she could follow up with the advanced heart failure service as an outpatient in ~ 1 week -Discussion had with the patient this morning regarding extremely poor prognosis, GOC, and code status. She would like to talk with her family prior to making a formal decision  -Recommend Palliative care consult to discuss [place] with patient and family  4. Elevated troponin: -No prior history of CAD, though coronary calcium noted of prior CT chest -Likely supply demand ischemia in the setting of hypoxia with multiorgan failure  5. COPD: -Complicated by severe PAH and right heart failure -Pulmonology on board  6. Anemia of chronic disease: -Stable  For questions or updates, please contact [place] Please consult [email or web address] for contact info under Cardiology/STEMI.    Signed, Christell Faith, PA-C [facility] Pager: [phone] 04/22/2018, 10:06 AM

## 2018-04-22 NOTE — Progress Notes (Signed)
Lindsay Lame, MD [facility]   [address] [place], [place] [address] Phone: [phone] Fax : [phone]   Subjective: The patient's liver enzymes have gone down by more than half overnight.  The patient states that she has nausea which has improved since her admission but still comes in waves.  There is no report of any vomiting.  The patient continues to be short of breath although she is not on high flow nasal cannula she was on yesterday.  The patient's labs for her abnormal liver enzymes are still pending except that she had a low iron saturation of 3%.  Her alpha-fetoprotein was also normal.   Objective: Vital signs in last 24 hours: Vitals:   04/21/18 1938 04/22/18 0103 04/22/18 0322 04/22/18 0809  BP: 126/71  111/69 123/63  Pulse: (!) 106 (!) 104 95 90  Resp: _0 Temp: 98.3 F (36.8 C)  98.2 F (36.8 C) 98.1 F (36.7 C)  TempSrc: Oral  Oral Oral  SpO2: 93%  92% 94%  Weight:   173 lb 4.8 oz (78.6 kg)   Height:       Weight change: 1 lb 4.8 oz (0.59 kg)  Intake/Output Summary (Last 24 hours) at 04/22/2018 1156 Last data filed at 04/22/2018 1037 Gross per 24 hour  Intake 497.92 ml  Output 1800 ml  Net -1302.08 ml     Exam: Heart:: Regular rate and rhythm, S1S2 present or without murmur or extra heart sounds Lungs: expiratory rhonchi Abdomen: soft, nontender, normal bowel sounds   Lab Results: _1 @ Micro Results: Recent Results (from the past 240 hour(s))  Blood culture (routine x 2)     Status: Abnormal   Collection Time: 04/19/18 11:43 PM  Result Value Ref Range Status   Specimen Description   Final    BLOOD RIGHT ARM Performed at [facility], [address] [place], [place] [address]    Special Requests   Final    BOTTLES DRAWN AEROBIC AND ANAEROBIC Blood Culture adequate volume Performed at [facility], [address] [place], [place] [address]    Culture  Setup Time   Final    GRAM POSITIVE RODS AEROBIC  BOTTLE ONLY CRITICAL RESULT CALLED TO, READ BACK BY AND VERIFIED WITH: JASON ROBBINS _2  04/20/18 AKT    Culture (A)  Final    DIPHTHEROIDS(CORYNEBACTERIUM SPECIES) Standardized susceptibility testing for this organism is not available. Performed at [facility], [place] [address], [place], [place] [address]    Report Status 04/22/2018 FINAL  Final  Blood culture (routine x 2)     Status: None (Preliminary result)   Collection Time: 04/19/18 11:43 PM  Result Value Ref Range Status   Specimen Description BLOOD RIGHT FOREARM  Final   Special Requests   Final    BOTTLES DRAWN AEROBIC AND ANAEROBIC Blood Culture results may not be optimal due to an excessive volume of blood received in culture bottles   Culture   Final    NO GROWTH 3 DAYS Performed at [facility], [address], [place], [place] [address]    Report Status PENDING  Incomplete  Blood Culture ID Panel (Reflexed)     Status: None   Collection Time: 04/19/18 11:43 PM  Result Value Ref Range Status   Enterococcus species NOT DETECTED NOT DETECTED Final   Listeria monocytogenes NOT DETECTED NOT DETECTED Final   Staphylococcus species NOT DETECTED NOT DETECTED Final   Staphylococcus aureus NOT DETECTED NOT DETECTED  Final   Streptococcus species NOT DETECTED NOT DETECTED Final   Streptococcus agalactiae NOT DETECTED NOT DETECTED Final   Streptococcus pneumoniae NOT DETECTED NOT DETECTED Final   Streptococcus pyogenes NOT DETECTED NOT DETECTED Final   Acinetobacter baumannii NOT DETECTED NOT DETECTED Final   Enterobacteriaceae species NOT DETECTED NOT DETECTED Final   Enterobacter cloacae complex NOT DETECTED NOT DETECTED Final   Escherichia coli NOT DETECTED NOT DETECTED Final   Klebsiella oxytoca NOT DETECTED NOT DETECTED Final   Klebsiella pneumoniae NOT DETECTED NOT DETECTED Final   Proteus species NOT DETECTED NOT DETECTED Final   Serratia marcescens NOT DETECTED NOT DETECTED Final   Haemophilus  influenzae NOT DETECTED NOT DETECTED Final   Neisseria meningitidis NOT DETECTED NOT DETECTED Final   Pseudomonas aeruginosa NOT DETECTED NOT DETECTED Final   Candida albicans NOT DETECTED NOT DETECTED Final   Candida glabrata NOT DETECTED NOT DETECTED Final   Candida krusei NOT DETECTED NOT DETECTED Final   Candida parapsilosis NOT DETECTED NOT DETECTED Final   Candida tropicalis NOT DETECTED NOT DETECTED Final    Comment: Performed at [facility], [address] [place], [place] [address]   Studies/Results: Dg Abd 1 View  Result Date: 04/20/2018 CLINICAL DATA:  Distended urinary bladder. EXAM: ABDOMEN - 1 VIEW COMPARISON:  CT scan of January 28, 2017. FINDINGS: The bowel gas pattern is normal. No radio-opaque calculi or other significant radiographic abnormality are seen. IMPRESSION: No definite evidence of bowel obstruction or ileus. Electronically Signed   By: Marijo Conception, M.D.   On: 04/20/2018 17:06   Medications: I have reviewed the patient's current medications. Scheduled Meds: . aspirin  81 mg Oral Daily  . insulin aspart  0-15 Units Subcutaneous TID WC  . methadone  20 mg Oral TID WC & HS  . metoprolol tartrate  12.5 mg Oral BID  . pantoprazole  40 mg Oral BID  . sildenafil  20 mg Oral TID  . sodium chloride flush  3 mL Intravenous Q12H   Continuous Infusions: . azithromycin Stopped (04/21/18 1516)  . piperacillin-tazobactam (ZOSYN)  IV 3.375 g (04/22/18 0935)   PRN Meds:.bisacodyl, guaiFENesin, HYDROmorphone (DILAUDID) injection, ipratropium-albuterol, menthol-cetylpyridinium, nitroGLYCERIN, ondansetron **OR** ondansetron (ZOFRAN) IV, senna-docusate   Assessment: Active Problems:   Acute renal failure superimposed on stage 4 chronic kidney disease (HCC)   Pulmonary hypertension (HCC)   Acute hypoxemic respiratory failure (HCC)   Elevated liver enzymes   Acute on chronic respiratory failure with hypoxia (HCC)   Elevated troponin   Acute on chronic  right-sided heart failure ([place])    Plan: This patient was found to have abnormal liver enzymes which are improving consistent with hypoperfusion from either her pulmonary hypertension or hypotension.  The patient's liver enzymes are being worked up with labs that are not back yet.  The patient does have significant iron deficiency with a iron of 14 and a saturation of 3.  The patient's acute hepatitis panel was negative.  It is likely that the patient's nausea will improve as her liver enzymes and pulmonary status improve.  Nothing further to do from a GI point of view.   LOS: 2 days   Lindsay Harmon 04/22/2018, 11:56 AM

## 2018-04-23 ENCOUNTER — Telehealth: Payer: Self-pay | Admitting: Pulmonary Disease

## 2018-04-23 ENCOUNTER — Telehealth: Payer: Self-pay

## 2018-04-23 LAB — COMPREHENSIVE METABOLIC PANEL
ALK PHOS: 86 U/L (ref 38–126)
ALT: 699 U/L — AB (ref 0–44)
ANION GAP: 10 (ref 5–15)
AST: 545 U/L — ABNORMAL HIGH (ref 15–41)
Albumin: 3 g/dL — ABNORMAL LOW (ref 3.5–5.0)
BILIRUBIN TOTAL: 1.5 mg/dL — AB (ref 0.3–1.2)
BUN: 46 mg/dL — ABNORMAL HIGH (ref 8–23)
CALCIUM: 8.8 mg/dL — AB (ref 8.9–10.3)
CO2: 27 mmol/L (ref 22–32)
CREATININE: 2.16 mg/dL — AB (ref 0.44–1.00)
Chloride: 98 mmol/L (ref 98–111)
GFR, EST AFRICAN AMERICAN: 27 mL/min — AB (ref >60)
GFR, EST NON AFRICAN AMERICAN: 23 mL/min — AB (ref >60)
Glucose, Bld: 129 mg/dL — ABNORMAL HIGH (ref 70–99)
Potassium: 4.2 mmol/L (ref 3.5–5.1)
SODIUM: 135 mmol/L (ref 135–145)
TOTAL PROTEIN: 6.5 g/dL (ref 6.5–8.1)

## 2018-04-23 LAB — GLUCOSE, CAPILLARY
GLUCOSE-CAPILLARY: 152 mg/dL — AB (ref 70–99)
GLUCOSE-CAPILLARY: 91 mg/dL (ref 70–99)
Glucose-Capillary: 194 mg/dL — ABNORMAL HIGH (ref 70–99)
Glucose-Capillary: 149 mg/dL — ABNORMAL HIGH (ref 70–99)

## 2018-04-23 MED ORDER — HYDROXYZINE HCL 25 MG PO TABS
25.0000 mg | ORAL_TABLET | Freq: Four times a day (QID) | ORAL | Status: DC | PRN
  Administered 2018-04-23: 25 mg via ORAL
  Filled 2018-04-23: qty 1

## 2018-04-23 MED ORDER — DOCUSATE SODIUM 100 MG PO CAPS
100.0000 mg | ORAL_CAPSULE | Freq: Two times a day (BID) | ORAL | Status: DC
  Administered 2018-04-23 – 2018-04-24 (×2): 100 mg via ORAL
  Filled 2018-04-23 (×2): qty 1

## 2018-04-23 NOTE — Progress Notes (Signed)
Daily Progress Note   Patient Name: Lindsay Harmon       Date: 04/23/2018 DOB: 1956/07/23  Age: 62 y.o. MRN#: [id number] Attending Physician: Vaughan Basta, * Primary Care Physician: Venia Carbon, MD Admit Date: 04/19/2018  Reason for Consultation/Follow-up: Establishing goals of care  Subjective: Patient alone today, no family at bedside. Alert, eating small amount of breakfast. Breathing feels better but nausea continues.   Length of Stay: 3  Current Medications: Scheduled Meds:  . aspirin  81 mg Oral Daily  . ferrous sulfate  325 mg Oral BID WC  . ferrous sulfate  325 mg Oral BID WC  . insulin aspart  0-15 Units Subcutaneous TID WC  . methadone  20 mg Oral TID WC & HS  . metoprolol tartrate  12.5 mg Oral BID  . pantoprazole  40 mg Oral BID  . sodium chloride flush  3 mL Intravenous Q12H    Continuous Infusions:   PRN Meds: bisacodyl, guaiFENesin, HYDROmorphone (DILAUDID) injection, ipratropium-albuterol, menthol-cetylpyridinium, ondansetron **OR** ondansetron (ZOFRAN) IV, promethazine, senna-docusate  Physical Exam   Constitutional: She is oriented to person, place, and time. She appears ill. No distress.  HENT:  Head: Normocephalic and atraumatic.  Cardiovascular: Normal rate and regular rhythm.  Pulmonary/Chest: Effort normal and breath sounds normal. No accessory muscle usage. No respiratory distress.  Abdominal: Soft. Bowel sounds are normal.  Neurological: She is alert and oriented to person, place, and time.  Skin: Skin is warm and dry.    Vital Signs: BP 125/84 (BP Location: Left Arm)   Pulse 81   Temp 97.6 F (36.4 C) (Oral)   Resp 16   Ht _0  (1.549 m)   Wt 79 kg (174 lb 1.6 oz)   SpO2 95%   BMI 32.90 kg/m  SpO2: SpO2: 95 % O2 Device: O2 Device:  Nasal Cannula O2 Flow Rate: O2 Flow Rate (L/min): 4 L/min  Intake/output summary:   Intake/Output Summary (Last 24 hours) at 04/23/2018 1047 Last data filed at 04/23/2018 0354 Gross per 24 hour  Intake 240 ml  Output 200 ml  Net 40 ml   LBM: Last BM Date: 04/22/18 Baseline Weight: Weight: 74.8 kg (165 lb) Most recent weight: Weight: 79 kg (174 lb 1.6 oz)       Palliative Assessment/Data: PPS 50%    Flowsheet Rows     Most Recent Value  Intake Tab  Referral Department  Hospitalist  Unit at Time of Referral  Cardiac/Telemetry Unit  Palliative Care Primary Diagnosis  Pulmonary  Date Notified  04/21/18  Palliative Care Type  New Palliative care  Reason for referral  Clarify Goals of Care  Date of Admission  04/19/18  Date first seen by Palliative Care  04/22/18  # of days Palliative referral response time  1 Day(s)  # of days IP prior to Palliative referral  2  Clinical Assessment  Palliative Performance Scale Score  50%  Psychosocial & Spiritual Assessment  Palliative Care Outcomes  Patient/Family meeting held?  Yes  Who was at the meeting?  patient, sister, mother  Palliative Care Outcomes  Provided psychosocial or spiritual support, ACP counseling assistance, Counseled regarding hospice, Linked to palliative care logitudinal support  Patient Active Problem List   Diagnosis Date Noted  . Goals of care, counseling/discussion   . Nausea without vomiting   . Palliative care by specialist   . Elevated liver enzymes   . Acute on chronic respiratory failure with hypoxia ([place])   . Elevated troponin   . Acute on chronic right heart failure ([place])   . Acute hypoxemic respiratory failure ([place]) 04/20/2018  . Aortic atherosclerosis ([place])   . Trochanteric bursitis of right hip 01/21/2018  . Narcotic dependence ([place]) 07/23/2017  . Personal history of tobacco use, presenting hazards to health 04/11/2017  . Encounter for chronic pain management 03/21/2017  . Bile duct  calculus without cholecystitis and no obstruction   . Elevated liver function tests 02/05/2017  . Hyposmolality and/or hyponatremia 06/22/2015  . Cor pulmonale, chronic ([place])   . Pulmonary hypertension ([place])   . Hyponatremia 06/02/2015  . RVF (right ventricular failure) ([place]) 01/17/2015  . Chronic respiratory failure with hypoxia ([place]) 12/15/2014  . COPD (chronic obstructive pulmonary disease) ([place]) 12/15/2014  . Chronic diastolic heart failure ([place]) 12/05/2014  . Acute renal failure superimposed on stage 4 chronic kidney disease ([place]) 12/04/2014  . Chronic renal disease, stage IV ([place]) 12/01/2014  . Advance directive discussed with patient 11/08/2014  . DM (diabetes mellitus), type 2, uncontrolled, periph vascular complic ([place]) 81/27/5170  . Diabetes, polyneuropathy ([place]) 09/15/2013  . Routine general medical examination at a health care facility 08/13/2011  . Atherosclerotic heart disease of native coronary artery with angina pectoris ([place]) 02/27/2007  . Chronic back pain 02/27/2007  . Type 2 diabetes mellitus with neurological manifestations, controlled ([place]) 02/26/2007  . Hyperlipemia 02/26/2007  . Episodic mood disorder ([place]) 02/26/2007  . Essential hypertension, benign 02/26/2007  . PVD (peripheral vascular disease) ([place]) 02/26/2007  . GERD 02/26/2007  . DEGENERATIVE JOINT DISEASE 02/26/2007    Palliative Care Assessment & Plan   HPI: 62 y.o. female  with past medical history of severe COPD,pulm htn,HTN, HLD, T2DM, GERD, CKD 4, remote ETOH abuse, and IBS admitted on 04/19/2018 with nausea, malaise, and worsening shortness of breath. Found to have acute on chronic respiratory failure r/t pulm htn, COPD and right heart failure. Also found to have elevated liver enzymes, acute kidney injury, and elevated troponin.  Pt on 4L O2 baseline. PMT consulted for [place].  Assessment: Follow up with patient from our long meeting yesterday. She shares with me that she is still feeling a little  overwhelmed learning how ill she is. Her son came and visited last night and they discussed her illness. She shares with me that he was tearful and they were both in shock and did not talk much about plan of care moving forward. She still feels unsure about goals of care. We discussed code status - she is unsure. She is hopeful to go home tomorrow. I offered outpatient palliative to help her with goals of care - she agrees. She agrees to review MOST form with family.   Patient is still processing severity of her illness and not currently able to make decisions regarding goals of care. She is mostly concerned with how her illness is affecting her family. We discussed value of discussing goals of care while she is able so that her family is not faced with making decisions for her if she were unable to. She plans to continue conversations with her family.   Recommendations/Plan:  Outpatient palliative  Needs time to process poor prognosis  Code Status:  Full code  Prognosis:  Unable to determine - poor prognosis r/t severe COPD, pulmonary htn, kidney disease, right sided heart failure  Discharge Planning:  Home with Palliative Services  Care plan was discussed with patient  Thank you for allowing the Palliative Medicine Team to assist in the care of this patient.   Total Time 40 minutes Prolonged Time Billed  no       Greater than 50%  of this time was spent counseling and coordinating care related to the above assessment and plan.  Juel Burrow, DNP, [facility] Palliative Medicine Team Team Phone # [phone]  Pager [phone]

## 2018-04-23 NOTE — Progress Notes (Signed)
Lindsay Lame, MD [facility]   [address] [place], [place] [address] Phone: [phone] Fax : [phone]   Subjective: This patient has been followed for abnormal liver enzymes likely due to hypotension.  The patient's liver enzymes continue to dramatically decrease every day.  She denies any new symptoms and states she is feeling well.   Objective: Vital signs in last 24 hours: Vitals:   04/23/18 0349 04/23/18 0725 04/23/18 1707 04/23/18 1911  BP: 125/84 110/62 118/69 109/70  Pulse: 81 89 90 88  Resp: _0 Temp: 97.6 F (36.4 C)  98.5 F (36.9 C) 98.8 F (37.1 C)  TempSrc: Oral   Oral  SpO2: 95% 90% 90% 93%  Weight: 174 lb 1.6 oz (79 kg)     Height:       Weight change: 12.8 oz (0.363 kg)  Intake/Output Summary (Last 24 hours) at 04/23/2018 2131 Last data filed at 04/23/2018 2106 Gross per 24 hour  Intake 483 ml  Output 950 ml  Net -467 ml     Exam: Heart:: Regular rate and rhythm, S1S2 present or without murmur or extra heart sounds Lungs: normal and clear to auscultation and percussion Abdomen: soft, nontender, normal bowel sounds   Lab Results: _1 @ Micro Results: Recent Results (from the past 240 hour(s))  Blood culture (routine x 2)     Status: Abnormal   Collection Time: 04/19/18 11:43 PM  Result Value Ref Range Status   Specimen Description   Final    BLOOD RIGHT ARM Performed at [facility], [address] [place], [place] [address]    Special Requests   Final    BOTTLES DRAWN AEROBIC AND ANAEROBIC Blood Culture adequate volume Performed at [facility], [address] [place], [place] [address]    Culture  Setup Time   Final    GRAM POSITIVE RODS AEROBIC BOTTLE ONLY CRITICAL RESULT CALLED TO, READ BACK BY AND VERIFIED WITH: JASON ROBBINS _2  04/20/18 AKT    Culture (A)  Final    DIPHTHEROIDS(CORYNEBACTERIUM SPECIES) Standardized susceptibility testing for this organism is not available. Performed  at [facility], [place] [address] [place], [place] [address]    Report Status 04/22/2018 FINAL  Final  Blood culture (routine x 2)     Status: None (Preliminary result)   Collection Time: 04/19/18 11:43 PM  Result Value Ref Range Status   Specimen Description BLOOD RIGHT FOREARM  Final   Special Requests   Final    BOTTLES DRAWN AEROBIC AND ANAEROBIC Blood Culture results may not be optimal due to an excessive volume of blood received in culture bottles   Culture   Final    NO GROWTH 4 DAYS Performed at [facility], [address] [place],  [address]    Report Status PENDING  Incomplete  Blood Culture ID Panel (Reflexed)     Status: None   Collection Time: 04/19/18 11:43 PM  Result Value Ref Range Status   Enterococcus species NOT DETECTED NOT DETECTED Final   Listeria monocytogenes NOT DETECTED NOT DETECTED Final   Staphylococcus species NOT DETECTED NOT DETECTED Final   Staphylococcus aureus NOT DETECTED NOT DETECTED Final   Streptococcus species NOT DETECTED NOT DETECTED Final   Streptococcus agalactiae NOT DETECTED NOT DETECTED Final   Streptococcus pneumoniae NOT DETECTED NOT DETECTED Final   Streptococcus pyogenes NOT DETECTED NOT DETECTED Final   Acinetobacter baumannii NOT DETECTED NOT DETECTED Final   Enterobacteriaceae species NOT DETECTED  NOT DETECTED Final   Enterobacter cloacae complex NOT DETECTED NOT DETECTED Final   Escherichia coli NOT DETECTED NOT DETECTED Final   Klebsiella oxytoca NOT DETECTED NOT DETECTED Final   Klebsiella pneumoniae NOT DETECTED NOT DETECTED Final   Proteus species NOT DETECTED NOT DETECTED Final   Serratia marcescens NOT DETECTED NOT DETECTED Final   Haemophilus influenzae NOT DETECTED NOT DETECTED Final   Neisseria meningitidis NOT DETECTED NOT DETECTED Final   Pseudomonas aeruginosa NOT DETECTED NOT DETECTED Final   Candida albicans NOT DETECTED NOT DETECTED Final   Candida glabrata NOT DETECTED NOT DETECTED Final    Candida krusei NOT DETECTED NOT DETECTED Final   Candida parapsilosis NOT DETECTED NOT DETECTED Final   Candida tropicalis NOT DETECTED NOT DETECTED Final    Comment: Performed at [facility], [address], [place], [place] [address]   Studies/Results: No results found. Medications: I have reviewed the patient's current medications. Scheduled Meds: . aspirin  81 mg Oral Daily  . docusate sodium  100 mg Oral BID  . ferrous sulfate  325 mg Oral BID WC  . insulin aspart  0-15 Units Subcutaneous TID WC  . methadone  20 mg Oral TID WC & HS  . metoprolol tartrate  12.5 mg Oral BID  . pantoprazole  40 mg Oral BID  . sodium chloride flush  3 mL Intravenous Q12H   Continuous Infusions: PRN Meds:.bisacodyl, guaiFENesin, HYDROmorphone (DILAUDID) injection, hydrOXYzine, ipratropium-albuterol, menthol-cetylpyridinium, ondansetron **OR** ondansetron (ZOFRAN) IV, promethazine, senna-docusate   Assessment: Active Problems:   Acute renal failure superimposed on stage 4 chronic kidney disease (HCC)   Pulmonary hypertension (HCC)   Acute hypoxemic respiratory failure (HCC)   Elevated liver enzymes   Acute on chronic respiratory failure with hypoxia (HCC)   Elevated troponin   Acute on chronic right heart failure (HCC)   Goals of care, counseling/discussion   Nausea without vomiting   Palliative care by specialist    Plan: This patient has symptoms and signs consistent with shock liver.  The patient's liver enzymes are rapidly decreasing.  Nothing further to do from a GI point of view and the patient should have her liver enzymes checked as an outpatient in 1 week after discharge. I will sign off.  Please call if any further GI concerns or questions.  We would like to thank you for the opportunity to participate in the care of Lindsay Harmon.    LOS: 3 days   Lindsay Harmon 04/23/2018, 9:31 PM

## 2018-04-23 NOTE — Evaluation (Signed)
Physical Therapy Evaluation Patient Details Name: Lindsay Harmon MRN: [id number] DOB: 02-17-1956 Today's Date: 04/23/2018   History of Present Illness  62 y.o. female with a known history of severe PHTN, on 4L [place] chronic O2, PT admitted with acute respiratory failure with c/o fatigue/malaise/lethargy/generalized weakness, N/V, CP/SOB.  Clinical Impression  Pt is able to ambulate slowly but with good safety and confidence apart from having some nausea during the effort.  She uses 4 liters at baseline, did use 6 liters during ambulation with expected fatigue but highly variable O2 sats mid 80s to mid 90s depending on how purposefully she was focusing on breath.  Pt has will have 24/7 assist at home and should be safe with limited in home activity, encouraged her to work with Silvana a while before she tries to do much in the way of running errands, etc.  Follow Up Recommendations Home health PT    Equipment Recommendations       Recommendations for Other Services       Precautions / Restrictions Precautions Precautions: None Restrictions Weight Bearing Restrictions: No      Mobility  Bed Mobility Overal bed mobility: Modified Independent             General bed mobility comments: Pt able to get to sitting EOB w/o assist  Transfers Overall transfer level: Modified independent Equipment used: Rolling walker (2 wheeled)             General transfer comment: Pt able to rise to standing and maintain balance w/ light AD use  Ambulation/Gait Ambulation/Gait assistance: Supervision Gait Distance (Feet): 150 Feet Assistive device: Rolling walker (2 wheeled)(6 liters O2)       General Gait Details: Pt with slow, cautious ambulation that was consistent and safe apart from fluctuations in O2 sats mid 80s to mid 90s on 6 liters depending on pt's awareness of purposful breathing.   Stairs            Wheelchair Mobility    Modified Rankin (Stroke Patients Only)        Balance                                             Pertinent Vitals/Pain Pain Assessment: No/denies pain    Home Living Family/patient expects to be discharged to:: Private residence   Available Help at Discharge: Family;Available 24 hours/day(stays with mother, sister is next door) Type of Home: House         Home Equipment: Walker - 4 wheels      Prior Function Level of Independence: Independent with assistive device(s)         Comments: Pt reports that until this year she was driving, running errands, etc     Hand Dominance        Extremity/Trunk Assessment   Upper Extremity Assessment Upper Extremity Assessment: Overall WFL for tasks assessed    Lower Extremity Assessment Lower Extremity Assessment: Overall WFL for tasks assessed       Communication   Communication: No difficulties  Cognition Arousal/Alertness: Awake/alert Behavior During Therapy: WFL for tasks assessed/performed Overall Cognitive Status: Within Functional Limits for tasks assessed  General Comments      Exercises General Exercises - Lower Extremity Ankle Circles/Pumps: Strengthening;10 reps Heel Slides: Strengthening;10 reps Hip ABduction/ADduction: Strengthening;10 reps Straight Leg Raises: AROM;5 reps   Assessment/Plan    PT Assessment Patient needs continued PT services  PT Problem List Decreased activity tolerance;Decreased mobility;Decreased safety awareness;Decreased knowledge of use of DME;Cardiopulmonary status limiting activity       PT Treatment Interventions DME instruction;Gait training;Stair training;Functional mobility training;Therapeutic activities;Therapeutic exercise;Balance training;Neuromuscular re-education;Patient/family education    PT Goals (Current goals can be found in the Care Plan section)  Acute Rehab PT Goals Patient Stated Goal: return home PT Goal Formulation: With  patient Time For Goal Achievement: 05/07/18 Potential to Achieve Goals: Fair    Frequency Min 2X/week   Barriers to discharge        Co-evaluation               AM-PAC PT "6 Clicks" Daily Activity  Outcome Measure Difficulty turning over in bed (including adjusting bedclothes, sheets and blankets)?: None Difficulty moving from lying on back to sitting on the side of the bed? : None Difficulty sitting down on and standing up from a chair with arms (e.g., wheelchair, bedside commode, etc,.)?: None Help needed moving to and from a bed to chair (including a wheelchair)?: None Help needed walking in hospital room?: A Little Help needed climbing 3-5 steps with a railing? : A Little 6 Click Score: 22    End of Session Equipment Utilized During Treatment: Gait belt;Oxygen(5 liters in bed, 6 during ambulation (4 at baseline)) Activity Tolerance: Patient limited by fatigue Patient left: with chair alarm set;with call bell/phone within reach   PT Visit Diagnosis: Muscle weakness (generalized) (M62.81);Difficulty in walking, not elsewhere classified (R26.2)    Time: 2782-9603 PT Time Calculation (min) (ACUTE ONLY): 31 min   Charges:   PT Evaluation $PT Eval Low Complexity: 1 Low PT Treatments $Gait Training: 8-22 mins $Therapeutic Exercise: 8-22 mins    Kreg Shropshire, DPT 04/23/2018, 2:12 PM

## 2018-04-23 NOTE — Telephone Encounter (Signed)
Pt was scheduled tomorrow for a 3 month f/u. Spoke to pt's son per DPR. She is still in the hospital and may get out tomorrow. We decided to cancel the appt. She can r/s once she gets home.

## 2018-04-23 NOTE — Progress Notes (Signed)
Pt complaining of generalized itching, day shift RN had paged MD with no response, this RN paged again, Dr. Jerelyn Charles gave orders for atarax 22m q6hr PRN.   AConley Simmonds RN, BSN

## 2018-04-23 NOTE — Progress Notes (Signed)
Progress Note  Patient Name: Lindsay Harmon Date of Encounter: 04/23/2018  Primary Cardiologist: Bensimhon  Subjective   Feels about the same this morning. Has ordered breakfast, looking at her lunch options at this time. Remains on 4 L via nasal cannula. Did not ambulate on 6/26. Labs show continued improvement in liver function. Vitals stable. Still considering GOC.   Inpatient Medications    Scheduled Meds: . aspirin  81 mg Oral Daily  . ferrous sulfate  325 mg Oral BID WC  . ferrous sulfate  325 mg Oral BID WC  . insulin aspart  0-15 Units Subcutaneous TID WC  . methadone  20 mg Oral TID WC & HS  . metoprolol tartrate  12.5 mg Oral BID  . pantoprazole  40 mg Oral BID  . sodium chloride flush  3 mL Intravenous Q12H   Continuous Infusions:  PRN Meds: bisacodyl, guaiFENesin, HYDROmorphone (DILAUDID) injection, ipratropium-albuterol, menthol-cetylpyridinium, ondansetron **OR** ondansetron (ZOFRAN) IV, promethazine, senna-docusate   Vital Signs    Vitals:   04/22/18 1825 04/22/18 1942 04/22/18 2116 04/23/18 0349  BP: 131/78 128/74  125/84  Pulse: 95 90 86 81  Resp: _0 Temp:  98.4 F (36.9 C)  97.6 F (36.4 C)  TempSrc:  Oral  Oral  SpO2: 91% 96%  95%  Weight:    174 lb 1.6 oz (79 kg)  Height:        Intake/Output Summary (Last 24 hours) at 04/23/2018 0751 Last data filed at 04/23/2018 0200 Gross per 24 hour  Intake 240 ml  Output 200 ml  Net 40 ml   Filed Weights   04/21/18 0500 04/22/18 0322 04/23/18 0349  Weight: 172 lb (78 kg) 173 lb 4.8 oz (78.6 kg) 174 lb 1.6 oz (79 kg)    Telemetry    NSR - Personally Reviewed  ECG    n/a - Personally Reviewed  Physical Exam   GEN: Chronically ill appearing; No acute distress.   Neck: No JVD. Cardiac: RRR, II/VI systolic murmur LUSB, I/VI diastolic murmur RUSB, no rubs, or gallops.  Respiratory: Mildly diminished at the bilateral bases.  GI: Soft, nontender, non-distended.   MS: No edema; No  deformity. Neuro:  Alert and oriented x 3; Nonfocal.  Psych: Normal affect.  Labs    Chemistry Recent Labs  Lab 04/20/18 0903 04/22/18 0501 04/23/18 0619  NA 133* 134* 135  K 4.1 4.3 4.2  CL 96* 98 98  CO2 _1 GLUCOSE 150* 130* 129*  BUN 46* 45* 46*  CREATININE 2.41* 2.08* 2.16*  CALCIUM 8.4* 8.6* 8.8*  PROT 6.3* 6.5 6.5  ALBUMIN 3.2* 3.1* 3.0*  AST 2,201* 926* 545*  ALT 966* 858* 699*  ALKPHOS 65 83 86  BILITOT 1.8* 1.6* 1.5*  GFRNONAA 20* 24* 23*  GFRAA 23* 28* 27*  ANIONGAP _2 Hematology Recent Labs  Lab 04/19/18 2214 04/20/18 0347 04/20/18 0903 04/22/18 0501  WBC 13.8*  --  13.8* 9.5  RBC 5.71* 5.86* 4.99 4.99  HGB 11.0*  --  9.7* 10.0*  HCT 38.8  --  32.5* 33.5*  MCV 67.8*  --  65.1* 67.1*  MCH 19.2*  --  19.5* 20.0*  MCHC 28.3*  --  30.0* 29.7*  RDW 21.7*  --  21.7* 21.7*  PLT 307  --  245 173    Cardiac Enzymes Recent Labs  Lab 04/20/18 0512 04/20/18 1141 04/20/18 1924 04/22/18 1601  TROPONINI 1.44*  1.79* 1.56* 0.45*   No results for input(s): TROPIPOC in the last 168 hours.   BNP Recent Labs  Lab 04/19/18 2215  BNP 1,679.0*     DDimer No results for input(s): DDIMER in the last 168 hours.   Radiology    No results found.  Cardiac Studies   TTE 04/21/2018: Study Conclusions  - Left ventricle: Systolic function was vigorous. The estimated ejection fraction was in the range of 65% to 70%. Doppler parameters are consistent with abnormal left ventricular relaxation (grade 1 diastolic dysfunction). - Ventricular septum: The contour showed diastolic flattening. These changes are consistent with RV volume overload. - Aortic valve: Cusp separation was reduced. Transvalvular velocity was increased. There was moderate stenosis. Mean gradient (S): 28 mm Hg. - Mitral valve: Calcified annulus. - Right ventricle: The cavity size was severely dilated. Wall thickness was normal. Systolic function was mildly  reduced. - Right atrium: The atrium was moderately dilated. - Tricuspid valve: There was severe regurgitation directed eccentrically. - Pulmonary arteries: Systolic pressure was severely increased, estimated to be 115 mm Hg plus central venous/right atrial pressure.  Patient Profile     62 y.o. female with history of COPD and PAH,HTN, HL, NIDDM, GERD, CKD IV, and IBSwho is being seen today for the evaluation ofhypoxia and right heart failure with multiorgan failure.  Assessment & Plan    1. Acute on chronic respiratory failure with hypoxia: -Likely multifactorial including severe PAH, COPD and RV failure -Remains on baseline 4 L supplemental oxygen via nasal cannula -Feels at her baseline -Ambulate to assess for worsening SOB, if worsening symptoms, would recommend transfer to [facility] if she continues to want aggressive interventions   2. PAH: -Worsening as noted on echo as above -Revatio has been discontinued after discussion with Dr. Haroldine Laws with no plans to resume given concern for associated hypotension and shunting -Remarkably, she has had a dramatic improvement in her symptoms and labs over the past 48 hours. If she continues to improve and labs stabilize/improve she may be able to follow up with the advanced heart service as an outpatient, otherwise we will need to consider transfer to [facility] this admission -It remains uncertain if she would like to undergo aggressive therapies such as inhaled NO, unlikely to ultimately drastically change her prognosis  -Coagulopathy precludes RHC at this time -Needs to ambulate in the hallway this morning to assess for symptoms, if she is fairly symptomatic with this she may require transfer to [facility]  3. Multiorgan failure/transaminitis/shock liver/acute on CKD stage IV: -Liver function improving -Likely related to [place] and RV failure with hypotension augmented possibly by Revatio -Continue supportive care and close monitoring -If symptoms  or objective findings worsen, she may require transfer to [facility], though for now, if she continues to improve like she has over the past 48 hours she could follow up with the advanced heart failure service as an outpatient in ~ 1 week -Discussion had with the patient this morning regarding extremely poor prognosis, GOC, and code status. She would like to talk with her family prior to making a formal decision  -Palliative care on board  4. Elevated troponin: -No prior history of CAD, though coronary calcium noted of prior CT chest -Likely supply demand ischemia in the setting of hypoxia with multiorgan failure  5. COPD: -Complicated by severe PAH and right heart failure -Pulmonology on board  6. Anemia of chronic disease/iron deficiency anemia: -On PO iron -Stable, no CBC today   For questions or updates, please  contact Katie Please consult [email or web address] for contact info under Cardiology/STEMI.    Signed, Christell Faith, PA-C [facility] Pager: [phone] 04/23/2018, 7:51 AM

## 2018-04-23 NOTE — Telephone Encounter (Signed)
Called and spoke with [place] from [facility].  She wanted to call and make BQ aware that the pt is currently in the hospital since 6/25 and [place] wanted to see if BQ wanted to hold off on restarting the Tyvaso when the pt comes home or does he want [place] to go ahead and give this medication.  BQ please advise. Thanks

## 2018-04-23 NOTE — Care Management Important Message (Signed)
Copy of signed IM left with patient in room.  

## 2018-04-23 NOTE — Care Management (Signed)
Informed by palliative care that it is recommended that patient have outpatient palliative follow up at discharge.

## 2018-04-23 NOTE — Progress Notes (Signed)
Chaplain followed up from previous visit and during rounding. Pt is struggling with decisions on her prognosis. Chaplain talked about her struggled with faith. Realizing she is not in control by " God" is. She can let go of the reality. Talking about it is difficult. She kept talking about "it" We defined" it"  as "dying" . We begin to talk about dying and what that could be. The pt. Is dealing with that and will talk with family about dying. Bonney Roussel will follow up.    04/23/18 1000  Clinical Encounter Type  Visited With Patient  Visit Type Follow-up;Spiritual support  Referral From Nurse

## 2018-04-23 NOTE — Progress Notes (Signed)
[facility] [place] NAME: Lindsay Harmon    MR#:  [id number]  DATE OF BIRTH:  October 07, 1956  SUBJECTIVE:  CHIEF COMPLAINT:   Chief Complaint  Patient presents with  . Weakness  . Hypotension  . Shortness of Breath   Hx of COPD, Pulm Htn, on sildenafil- came with worsening SOB for last few days. Noted elevated Troponin, No chest pain.  improved, on 4 ltr nasal [place] oxygen now.  Still have nausea today.  REVIEW OF SYSTEMS:  CONSTITUTIONAL: No fever, fatigue or weakness.  EYES: No blurred or double vision.  EARS, NOSE, AND THROAT: No tinnitus or ear pain.  RESPIRATORY: have cough, shortness of breath,no wheezing or hemoptysis.  CARDIOVASCULAR: No chest pain, orthopnea, edema.  GASTROINTESTINAL: No nausea, vomiting, diarrhea or abdominal pain.  GENITOURINARY: No dysuria, hematuria.  ENDOCRINE: No polyuria, nocturia,  HEMATOLOGY: No anemia, easy bruising or bleeding SKIN: No rash or lesion. MUSCULOSKELETAL: No joint pain or arthritis.   NEUROLOGIC: No tingling, numbness, weakness.  PSYCHIATRY: No anxiety or depression.   ROS  DRUG ALLERGIES:   Allergies  Allergen Reactions  . Sertraline Hcl Other (See Comments)    Cloudy thoughts, malaise    VITALS:  Blood pressure 110/62, pulse 89, temperature 97.6 F (36.4 C), temperature source Oral, resp. rate 18, height _0  (1.549 m), weight 79 kg (174 lb 1.6 oz), SpO2 90 %.  PHYSICAL EXAMINATION:  GENERAL:  62 y.o.-year-old patient lying in the bed with no acute distress.  EYES: Pupils equal, round, reactive to light and accommodation. No scleral icterus. Extraocular muscles intact.  HEENT: Head atraumatic, normocephalic. Oropharynx and nasopharynx clear.  NECK:  Supple, no jugular venous distention. No thyroid enlargement, no tenderness.  LUNGS: Normal breath sounds bilaterally, no wheezing, some crepitation. No use of accessory muscles of respiration. On 4 ltr [place]  oxygen. CARDIOVASCULAR: S1, S2 normal. No murmurs, rubs, or gallops.  ABDOMEN: Soft, nontender, nondistended. Bowel sounds present. No organomegaly or mass.  EXTREMITIES: No pedal edema, cyanosis, or clubbing.  NEUROLOGIC: Cranial nerves II through XII are intact. Muscle strength 4/5 in all extremities. Sensation intact. Gait not checked.  PSYCHIATRIC: The patient is alert and oriented x 3.  SKIN: No obvious rash, lesion, or ulcer.   Physical Exam LABORATORY PANEL:   CBC Recent Labs  Lab 04/22/18 0501  WBC 9.5  HGB 10.0*  HCT 33.5*  PLT 173   ------------------------------------------------------------------------------------------------------------------  Chemistries  Recent Labs  Lab 04/20/18 0347  04/23/18 0619  NA  --    < > 135  K  --    < > 4.2  CL  --    < > 98  CO2  --    < > 27  GLUCOSE  --    < > 129*  BUN  --    < > 46*  CREATININE  --    < > 2.16*  CALCIUM  --    < > 8.8*  MG 1.4*  --   --   AST  --    < > 545*  ALT  --    < > 699*  ALKPHOS  --    < > 86  BILITOT  --    < > 1.5*   < > = values in this interval not displayed.   ------------------------------------------------------------------------------------------------------------------  Cardiac Enzymes Recent Labs  Lab 04/20/18 1924 04/22/18 1601  TROPONINI 1.56* 0.45*   ------------------------------------------------------------------------------------------------------------------  RADIOLOGY:  No results found.  ASSESSMENT  AND PLAN:   Active Problems:   Acute renal failure superimposed on stage 4 chronic kidney disease (HCC)   Pulmonary hypertension (HCC)   Acute hypoxemic respiratory failure (HCC)   Elevated liver enzymes   Acute on chronic respiratory failure with hypoxia (HCC)   Elevated troponin   Acute on chronic right heart failure (HCC)   Goals of care, counseling/discussion   Nausea without vomiting   Palliative care by specialist  * Ac on Ch hypoxic respi failure   Due  to Severe pulm hypertension   Pneumonia can not be ruled out.    Will cont Nebs, Antibiotics, stopped sildenafil.   pulm consult appreciated   Try to taper oxygen. At baseline 4 ltr at home.   procalcitonin is high, but pt have heart , lung and CKD issues.    She is clinically improving, will stop Abx. Pneumonia ruled out.   Cardio suggested to stop sildenafil.  * Elevated troponin      May be demand ischemia   Can not r/o NSTEMI yet, so on heparine drip.   Cardio consult appreciated, stopped heparine drip.   No cath due to renal failure.  * CKD stage 3   Appears stable  * elevated LFTs   Likely due to liver congestion due to Pulm htn   Monitor. Ordered hepatitis and GI consult.  ordered other autoimmune work up by him.   Check INR daily.  * Iron deficiency anemia   May need replacement iron on discharge.  * Hypertension   Metoprolol. Enalapril.- d/c enalapril due to hypotension.  * nausea   Likely due to elevated LFTs    Symptomatic management with meds.  All the records are reviewed and case discussed with Care Management/Social Workerr. Management plans discussed with the patient, family and they are in agreement.  CODE STATUS: Full.  TOTAL TIME TAKING CARE OF THIS PATIENT: 35 minutes.   Discussed with her sister in room.  POSSIBLE D/C IN 1-2 DAYS, DEPENDING ON CLINICAL CONDITION.   Vaughan Basta M.D on 04/23/2018   Between 7am to 6pm - Pager - [phone]  After 6pm go to [email or web address] - password [email or web address]  Office  [phone]  CC: Primary care physician; Venia Carbon, MD  Note: This dictation was prepared with Dragon dictation along with smaller phrase technology. Any transcriptional errors that result from this process are unintentional.

## 2018-04-24 ENCOUNTER — Encounter: Payer: Self-pay | Admitting: *Deleted

## 2018-04-24 ENCOUNTER — Ambulatory Visit: Payer: PPO | Admitting: Internal Medicine

## 2018-04-24 LAB — CBC
HCT: 36.7 % (ref 35.0–47.0)
Hemoglobin: 10.7 g/dL — ABNORMAL LOW (ref 12.0–16.0)
MCH: 19.5 pg — AB (ref 26.0–34.0)
MCHC: 29.1 g/dL — ABNORMAL LOW (ref 32.0–36.0)
MCV: 67.2 fL — ABNORMAL LOW (ref 80.0–100.0)
PLATELETS: 184 10*3/uL (ref 150–440)
RBC: 5.47 MIL/uL — AB (ref 3.80–5.20)
RDW: 21.4 % — ABNORMAL HIGH (ref 11.5–14.5)
WBC: 16.1 10*3/uL — AB (ref 3.6–11.0)

## 2018-04-24 LAB — COMPREHENSIVE METABOLIC PANEL
ALT: 679 U/L — AB (ref 0–44)
AST: 574 U/L — ABNORMAL HIGH (ref 15–41)
Albumin: 3.4 g/dL — ABNORMAL LOW (ref 3.5–5.0)
Alkaline Phosphatase: 99 U/L (ref 38–126)
Anion gap: 16 — ABNORMAL HIGH (ref 5–15)
BUN: 53 mg/dL — ABNORMAL HIGH (ref 8–23)
CO2: 21 mmol/L — AB (ref 22–32)
CREATININE: 2.37 mg/dL — AB (ref 0.44–1.00)
Calcium: 9.3 mg/dL (ref 8.9–10.3)
Chloride: 97 mmol/L — ABNORMAL LOW (ref 98–111)
GFR, EST AFRICAN AMERICAN: 24 mL/min — AB (ref >60)
GFR, EST NON AFRICAN AMERICAN: 21 mL/min — AB (ref >60)
Glucose, Bld: 70 mg/dL (ref 70–99)
Potassium: 4.8 mmol/L (ref 3.5–5.1)
SODIUM: 134 mmol/L — AB (ref 135–145)
TOTAL PROTEIN: 7 g/dL (ref 6.5–8.1)
Total Bilirubin: 3.1 mg/dL — ABNORMAL HIGH (ref 0.3–1.2)

## 2018-04-24 LAB — GLUCOSE, CAPILLARY
GLUCOSE-CAPILLARY: 119 mg/dL — AB (ref 70–99)
GLUCOSE-CAPILLARY: 181 mg/dL — AB (ref 70–99)

## 2018-04-24 LAB — CULTURE, BLOOD (ROUTINE X 2): CULTURE: NO GROWTH

## 2018-04-24 MED ORDER — SENNOSIDES-DOCUSATE SODIUM 8.6-50 MG PO TABS: 1.0000 | ORAL_TABLET | Freq: Every evening | ORAL | 0 refills | Status: DC | PRN

## 2018-04-24 MED ORDER — ONDANSETRON 4 MG PO TBDP: 4.0000 mg | ORAL_TABLET | Freq: Three times a day (TID) | ORAL | 0 refills | Status: DC | PRN

## 2018-04-24 MED ORDER — FERROUS SULFATE 325 (65 FE) MG PO TABS: 325.0000 mg | ORAL_TABLET | Freq: Two times a day (BID) | ORAL | 3 refills | Status: DC

## 2018-04-24 NOTE — Progress Notes (Signed)
IVs and tele removed from patient. Discharge instructions and hard copy prescriptions given to patient. Verbalized understanding. No acute distress at this time. Family to transport patient home.

## 2018-04-24 NOTE — Care Management (Signed)
Patient for discharge home today.  Patient will actually go to her mother's address (which is right next door to her own residence) at  [place].  She is agreeable to have home health and agency preference is [facility].  Her sister will transport Home and verbalizes understanding to have her portable oxygen tanks brought for transport home.  Requested order for home health nurse, physical therapy and aide and referral called to [facility]

## 2018-04-24 NOTE — Progress Notes (Signed)
Chaplain followed up with Pt. Pt was laying looking a little tired with a book [place] on the tray table. Pt talked about how good"He is"  We talked about spiritual matters in relationship with "He" We talked about her first marriage some difficulties in that relationship. Chaplain will continue to follow.    04/24/18 1000  Clinical Encounter Type  Visited With Patient  Visit Type Follow-up;Spiritual support  Spiritual Encounters  Spiritual Needs Prayer;Emotional

## 2018-04-24 NOTE — Discharge Summary (Signed)
[facility] at [place] NAME: Lindsay Harmon    MR#:  [id number]  DATE OF BIRTH:  August 06, 1956  DATE OF ADMISSION:  04/19/2018 ADMITTING PHYSICIAN: Arta Silence, MD  DATE OF DISCHARGE: 04/24/2018   PRIMARY CARE PHYSICIAN: Venia Carbon, MD    ADMISSION DIAGNOSIS:  Hypoxia [R09.02] Elevated liver enzymes [R74.8] Elevated troponin [R74.8] Transaminasemia [R74.0] Elevated lactic acid level [R79.89] Leukocytosis, unspecified type [D72.829] Acute hypoxemic respiratory failure (HCC) [J96.01]  DISCHARGE DIAGNOSIS:  Active Problems:   Acute renal failure superimposed on stage 4 chronic kidney disease (HCC)   Pulmonary hypertension (HCC)   Acute hypoxemic respiratory failure (HCC)   Elevated liver enzymes   Acute on chronic respiratory failure with hypoxia (HCC)   Elevated troponin   Acute on chronic right heart failure (HCC)   Goals of care, counseling/discussion   Nausea without vomiting   Palliative care by specialist   SECONDARY DIAGNOSIS:   Past Medical History:  Diagnosis Date  . Allergy   . Aortic atherosclerosis ([place])   . Arthritis   . CKD (chronic kidney disease), stage IV ([place])   . COPD (chronic obstructive pulmonary disease) ([place])    a. 11/2014 PFT's: FEV1 0.87 (38%), FVC 1.41 (48%), FEF 25-75 0.41 (19%). Unable to perform DLCO; b. 12/2017 Simple spirometry ration 67%. FEV1 1.21 L+53% predicted. FVC 1.79L 61% predicted.  . Coronary artery calcification seen on CT scan    a. 03/2017 CT Chest.  . Depression   . Diabetic neuropathy ([place])   . Esophageal stricture   . Familial hematuria   . GERD (gastroesophageal reflux disease)   . Hyperlipidemia   . Hypertension   . IBS (irritable bowel syndrome)   . Nephrolithiasis   . NIDDM (non-insulin dependent diabetes mellitus)   . Obesity, unspecified   . Pulmonary hypertension ([place])    a. WHO Group 3; b. 10/2016 Echo: EF 55-60%, no rwma, Gr1 DD, mild to mod AS, mean  grad 28mHg, mildly dil RV w/ mildly reduced RV fxn, mildly dil RA. PASP 1135mg; c. 12/2017 RHC: RA 11, RV 92/12, PA 92/33(57), PCWP 15, Fick CO/CI 5.9/3.3. PVR 7.2 WU. Ao 93%, PA 62/64%.  . Marland KitchenVD (peripheral vascular disease) ([facility]    HOSPITAL COURSE:   Acute renal failure superimposed on stage 4 chronic kidney disease ([place]  Pulmonary hypertension (HCC)   Acute hypoxemic respiratory failure (HCC)   Elevated liver enzymes   Acute on chronic respiratory failure with hypoxia (HCC)   Elevated troponin   Acute on chronic right heart failure (HCC)   Goals of care, counseling/discussion   Nausea without vomiting   Palliative care by specialist  * Ac on Ch hypoxic respi failure   Due to Severe pulm hypertension   Pneumonia ruled out.    Will cont Nebs, Antibiotics, stopped sildenafil.   pulm consult appreciated   Try to taper oxygen. At baseline 4 ltr at home.   procalcitonin is high, but pt have heart , lung and CKD issues.    She is clinically improving, will stop Abx. Pneumonia ruled out.   Cardio suggested to stop sildenafil.   Follow in heart failure clinic in 1 week.  * Elevated troponin      May be demand ischemia   Can not r/o NSTEMI yet, so on heparine drip.   Cardio consult appreciated, stopped heparine drip.   No cath due to renal failure.  * CKD stage 3   Appears stable  *  elevated LFTs   Likely due to liver congestion due to Pulm htn   Monitor. Ordered hepatitis and GI consult.  ordered other autoimmune work up by him.   Check INR daily.  * Iron deficiency anemia   May need replacement iron on discharge.  * Hypertension   Metoprolol. Enalapril.- d/c enalapril due to hypotension.  * nausea   Likely due to elevated LFTs    Symptomatic management with meds.  Need to follow LFT and renal func in  1 week with PMD.   DISCHARGE CONDITIONS:   Stable.  CONSULTS OBTAINED:  Treatment Team:  Arta Silence, MD Wellington Hampshire, MD  DRUG  ALLERGIES:   Allergies  Allergen Reactions  . Sertraline Hcl Other (See Comments)    Cloudy thoughts, malaise    DISCHARGE MEDICATIONS:   Allergies as of 04/24/2018      Reactions   Sertraline Hcl Other (See Comments)   Cloudy thoughts, malaise      Medication List    STOP taking these medications   enalapril 5 MG tablet Commonly known as:  VASOTEC   gabapentin 300 MG capsule Commonly known as:  NEURONTIN   glipiZIDE 5 MG tablet Commonly known as:  GLUCOTROL   Insulin Glargine 100 UNIT/ML Solostar Pen Commonly known as:  LANTUS   Insulin Pen Needle 31G X 5 MM Misc Commonly known as:  SURE COMFORT PEN NEEDLES   loratadine 10 MG tablet Commonly known as:  CLARITIN   ONE TOUCH ULTRA TEST test strip Generic drug:  glucose blood   sildenafil 20 MG tablet Commonly known as:  REVATIO   torsemide 20 MG tablet Commonly known as:  DEMADEX     TAKE these medications   aspirin 325 MG tablet Take 325 mg by mouth at bedtime.   calcitRIOL 0.5 MCG capsule Commonly known as:  ROCALTROL Take 0.5 mcg by mouth daily.   cholecalciferol 1000 units tablet Commonly known as:  VITAMIN D Take 1,000 Units by mouth at bedtime.   ferrous sulfate 325 (65 FE) MG tablet Take 1 tablet (325 mg total) by mouth 2 (two) times daily with a meal.   lovastatin 40 MG tablet Commonly known as:  MEVACOR Take 2 tablets (80 mg total) by mouth at bedtime.   methadone 10 MG tablet Commonly known as:  DOLOPHINE Take 2 tablets (20 mg total) by mouth 4 (four) times daily.   metoprolol tartrate 25 MG tablet Commonly known as:  LOPRESSOR Take 1/2 tablet by mouth twice a day   mirtazapine 30 MG tablet Commonly known as:  REMERON Take 15 mg by mouth at bedtime.   nitroGLYCERIN 0.4 MG SL tablet Commonly known as:  NITROSTAT Place 1 tablet (0.4 mg total) under the tongue every 5 (five) minutes as needed for chest pain.   pantoprazole 40 MG tablet Commonly known as:  PROTONIX Take 1 tablet  by mouth twice a day   senna-docusate 8.6-50 MG tablet Commonly known as:  Senokot-S Take 1 tablet by mouth at bedtime as needed for mild constipation.   STIOLTO RESPIMAT 2.5-2.5 MCG/ACT Aers Generic drug:  Tiotropium Bromide-Olodaterol Inhale 2 puffs into the lungs daily.   VENTOLIN HFA 108 (90 Base) MCG/ACT inhaler Generic drug:  albuterol USE 2 PUFFS EVERY SIX HOURS AS NEEDED FOR WHEEZING OR SHORTNESS OF BREATH        DISCHARGE INSTRUCTIONS:    Follow with PMD and cardio clinic in 1-2 weeks.  If you experience worsening of your admission symptoms, develop shortness  of breath, life threatening emergency, suicidal or homicidal thoughts you must seek medical attention immediately by calling 911 or calling your MD immediately  if symptoms less severe.  You Must read complete instructions/literature along with all the possible adverse reactions/side effects for all the Medicines you take and that have been prescribed to you. Take any new Medicines after you have completely understood and accept all the possible adverse reactions/side effects.   Please note  You were cared for by a hospitalist during your hospital stay. If you have any questions about your discharge medications or the care you received while you were in the hospital after you are discharged, you can call the unit and asked to speak with the hospitalist on call if the hospitalist that took care of you is not available. Once you are discharged, your primary care physician will handle any further medical issues. Please note that NO REFILLS for any discharge medications will be authorized once you are discharged, as it is imperative that you return to your primary care physician (or establish a relationship with a primary care physician if you do not have one) for your aftercare needs so that they can reassess your need for medications and monitor your lab values.    Today   CHIEF COMPLAINT:   Chief Complaint  Patient  presents with  . Weakness  . Hypotension  . Shortness of Breath    HISTORY OF PRESENT ILLNESS:  Lindsay Harmon  is a 62 y.o. female with a known history of severe PHTN (PASP 92 on 01/22/2018 R heart cath; on Revatio x32mo, on 4L [place] chronic O2, p/w fatigue/malaise/lethargy/generalized weakness, N/V, CP/SOB x1-2d. Pt staets that she ate dinner @~1600-1700PM on Saturday 04/18/2018. She ate pork chops, a baked potato and a dinner roll. She states she started feeling fatigue/generalized weakness (feeling "run down") shortly thereafter. She noted mild abdominal cramping, but states her symptoms were mild, and she went to bed. She woke on Sunday 06/23 morning feeling nauseous. She states she did not eat breakfast or take any of her medications. She states she had transient midchest tightness at approximately midday, but she states it only lasted ~1hr, and was very mild in severity. It resolved spontaneously. She ate several saltines in the early afternoon, but did not eat anything else for the rest of the day. She states she had nausea on and off all day, as well as dry heaving. She only vomited once, a small quantity of clear liquid, (-) blood/bile. She endorses dehydration. She states her nausea later improved, but she felt very lethargic. @~2130PM, her son arrived, and states he found her to have labored breathing, w/ SpO2 72% on 4L [place]. EMS was called. Her son states she was pale, cyanotic, hypotensive and hypoglycemic to EMS. She has since been stabilized, and is on non-rebreather mask at the time of my assessment. She answers my questions w/o difficulty, and does not exhibit conversational dyspnea. She is comfortable and well-appearing, in no acute distress. She AAOx3, and does not appear septic or toxic. Her lungs are clear. She states she is already feeling better.  Pt has been on Revatio x168moShe has not taken any medications all day Sunday (06/23). She denies EtOH or illicit drug use. She denies recent  travel or sick contacts. She denies Hx of blood transfusion. She takes Tylenol only very occasionally. She is not on systemic anticoagulation. She does not take Metformin. She does not take any herbal or OTC medications. She denies any changes  in diet.     VITAL SIGNS:  Blood pressure 131/69, pulse 85, temperature 98 F (36.7 C), temperature source Oral, resp. rate 16, height _0  (1.549 m), weight 79.6 kg (175 lb 8 oz), SpO2 93 %.  I/O:    Intake/Output Summary (Last 24 hours) at 04/24/2018 1421 Last data filed at 04/24/2018 1404 Gross per 24 hour  Intake 483 ml  Output 350 ml  Net 133 ml    PHYSICAL EXAMINATION:   GENERAL:  62 y.o.-year-old patient lying in the bed with no acute distress.  EYES: Pupils equal, round, reactive to light and accommodation. No scleral icterus. Extraocular muscles intact.  HEENT: Head atraumatic, normocephalic. Oropharynx and nasopharynx clear.  NECK:  Supple, no jugular venous distention. No thyroid enlargement, no tenderness.  LUNGS: Normal breath sounds bilaterally, no wheezing, some crepitation. No use of accessory muscles of respiration. On 4 ltr [place] oxygen. CARDIOVASCULAR: S1, S2 normal. No murmurs, rubs, or gallops.  ABDOMEN: Soft, nontender, nondistended. Bowel sounds present. No organomegaly or mass.  EXTREMITIES: No pedal edema, cyanosis, or clubbing.  NEUROLOGIC: Cranial nerves II through XII are intact. Muscle strength 4/5 in all extremities. Sensation intact. Gait not checked.  PSYCHIATRIC: The patient is alert and oriented x 3.  SKIN: No obvious rash, lesion, or ulcer.     DATA REVIEW:   CBC Recent Labs  Lab 04/24/18 0441  WBC 16.1*  HGB 10.7*  HCT 36.7  PLT 184    Chemistries  Recent Labs  Lab 04/20/18 0347  04/24/18 0441  NA  --    < > 134*  K  --    < > 4.8  CL  --    < > 97*  CO2  --    < > 21*  GLUCOSE  --    < > 70  BUN  --    < > 53*  CREATININE  --    < > 2.37*  CALCIUM  --    < > 9.3  MG 1.4*  --   --    AST  --    < > 574*  ALT  --    < > 679*  ALKPHOS  --    < > 99  BILITOT  --    < > 3.1*   < > = values in this interval not displayed.    Cardiac Enzymes Recent Labs  Lab 04/22/18 1601  TROPONINI 0.45*    Microbiology Results  Results for orders placed or performed during the hospital encounter of 04/19/18  Blood culture (routine x 2)     Status: Abnormal   Collection Time: 04/19/18 11:43 PM  Result Value Ref Range Status   Specimen Description   Final    BLOOD RIGHT ARM Performed at [facility], [address] [place], [place] [address]    Special Requests   Final    BOTTLES DRAWN AEROBIC AND ANAEROBIC Blood Culture adequate volume Performed at [facility], [address] [place], [place] [address]    Culture  Setup Time   Final    GRAM POSITIVE RODS AEROBIC BOTTLE ONLY CRITICAL RESULT CALLED TO, READ BACK BY AND VERIFIED WITH: JASON ROBBINS _1  04/20/18 AKT    Culture (A)  Final    DIPHTHEROIDS(CORYNEBACTERIUM SPECIES) Standardized susceptibility testing for this organism is not available. Performed at [facility], [address], [place], [place] [address]    Report Status 04/22/2018 FINAL  Final  Blood culture (routine x  2)     Status: None   Collection Time: 04/19/18 11:43 PM  Result Value Ref Range Status   Specimen Description BLOOD RIGHT FOREARM  Final   Special Requests   Final    BOTTLES DRAWN AEROBIC AND ANAEROBIC Blood Culture results may not be optimal due to an excessive volume of blood received in culture bottles   Culture   Final    NO GROWTH 5 DAYS Performed at [facility], [address] [place], [place] [address]    Report Status 04/24/2018 FINAL  Final  Blood Culture ID Panel (Reflexed)     Status: None   Collection Time: 04/19/18 11:43 PM  Result Value Ref Range Status   Enterococcus species NOT DETECTED NOT DETECTED Final   Listeria monocytogenes NOT DETECTED NOT DETECTED Final   Staphylococcus  species NOT DETECTED NOT DETECTED Final   Staphylococcus aureus NOT DETECTED NOT DETECTED Final   Streptococcus species NOT DETECTED NOT DETECTED Final   Streptococcus agalactiae NOT DETECTED NOT DETECTED Final   Streptococcus pneumoniae NOT DETECTED NOT DETECTED Final   Streptococcus pyogenes NOT DETECTED NOT DETECTED Final   Acinetobacter baumannii NOT DETECTED NOT DETECTED Final   Enterobacteriaceae species NOT DETECTED NOT DETECTED Final   Enterobacter cloacae complex NOT DETECTED NOT DETECTED Final   Escherichia coli NOT DETECTED NOT DETECTED Final   Klebsiella oxytoca NOT DETECTED NOT DETECTED Final   Klebsiella pneumoniae NOT DETECTED NOT DETECTED Final   Proteus species NOT DETECTED NOT DETECTED Final   Serratia marcescens NOT DETECTED NOT DETECTED Final   Haemophilus influenzae NOT DETECTED NOT DETECTED Final   Neisseria meningitidis NOT DETECTED NOT DETECTED Final   Pseudomonas aeruginosa NOT DETECTED NOT DETECTED Final   Candida albicans NOT DETECTED NOT DETECTED Final   Candida glabrata NOT DETECTED NOT DETECTED Final   Candida krusei NOT DETECTED NOT DETECTED Final   Candida parapsilosis NOT DETECTED NOT DETECTED Final   Candida tropicalis NOT DETECTED NOT DETECTED Final    Comment: Performed at [facility], [address] [place], [place] [address]    RADIOLOGY:  No results found.  EKG:   Orders placed or performed during the hospital encounter of 04/19/18  . ED EKG  . ED EKG  . EKG 12-Lead  . EKG 12-Lead      Management plans discussed with the patient, family and they are in agreement.  CODE STATUS:     Code Status Orders  (From admission, onward)        Start     Ordered   04/21/18 1747  Full code  Continuous     04/21/18 1746    Code Status History    Date Active Date Inactive Code Status Order ID Comments User Context   04/21/2018 1737 04/21/2018 1746 DNR [id number]  Vaughan Basta, MD Inpatient   04/20/2018 0451 04/21/2018  1737 Full Code [id number]  Arta Silence, MD Inpatient   01/22/2018 1119 01/22/2018 1538 Full Code [id number]  Jolaine Artist, MD Inpatient   01/28/2017 0317 01/29/2017 1935 Full Code [id number]  [place], Ubaldo Glassing, DO ED   10/19/2015 1411 10/20/2015 1418 Full Code [id number]  Harrie Foreman, MD Inpatient   06/02/2015 2143 06/09/2015 2052 Full Code [id number]  Rise Patience, MD Inpatient   12/05/2014 1957 12/07/2014 1846 Full Code [id number]  Belva Crome, MD Inpatient   12/03/2014 1500 12/05/2014 1957 Full Code [id number]  Thurnell Lose, MD ED      TOTAL TIME TAKING CARE OF THIS  PATIENT: 35 minutes.    Vaughan Basta M.D on 04/24/2018 at 2:21 PM  Between 7am to 6pm - Pager - [phone]  After 6pm go to [email or web address] - password [email or web address] [place] [facility]  Office  [phone]  CC: Primary care physician; Venia Carbon, MD   Note: This dictation was prepared with Dragon dictation along with smaller phrase technology. Any transcriptional errors that result from this process are unintentional.

## 2018-04-24 NOTE — Progress Notes (Signed)
Progress Note  Patient Name: Lindsay Harmon Date of Encounter: 04/24/2018  Primary Cardiologist: Bensimhon  Subjective   Feels little improved today when compared to the prior day. No chest pain. Ambulated in the hallway with noted weakness, though otherwise without increased SOB or chest pain. Liver function stable, remains elevated. Renal function worsening. Leukocytosis of 16.1 this morning. Weight 174-->175 pounds this morning. Appetite not great, though at her ~ baseline.   Inpatient Medications    Scheduled Meds: . aspirin  81 mg Oral Daily  . docusate sodium  100 mg Oral BID  . ferrous sulfate  325 mg Oral BID WC  . insulin aspart  0-15 Units Subcutaneous TID WC  . methadone  20 mg Oral TID WC & HS  . metoprolol tartrate  12.5 mg Oral BID  . pantoprazole  40 mg Oral BID  . sodium chloride flush  3 mL Intravenous Q12H   Continuous Infusions:  PRN Meds: bisacodyl, guaiFENesin, HYDROmorphone (DILAUDID) injection, hydrOXYzine, ipratropium-albuterol, menthol-cetylpyridinium, ondansetron **OR** ondansetron (ZOFRAN) IV, promethazine, senna-docusate   Vital Signs    Vitals:   04/23/18 1911 04/24/18 0045 04/24/18 0424 04/24/18 0814  BP: 109/70 117/77 118/68 131/69  Pulse: 88 92 92 85  Resp: _0 Temp: 98.8 F (37.1 C) 97.6 F (36.4 C) 97.9 F (36.6 C) 98 F (36.7 C)  TempSrc: Oral Oral Oral Oral  SpO2: 93% 90% 92% 93%  Weight:   175 lb 8 oz (79.6 kg)   Height:        Intake/Output Summary (Last 24 hours) at 04/24/2018 0817 Last data filed at 04/24/2018 0204 Gross per 24 hour  Intake 483 ml  Output 850 ml  Net -367 ml   Filed Weights   04/22/18 0322 04/23/18 0349 04/24/18 0424  Weight: 173 lb 4.8 oz (78.6 kg) 174 lb 1.6 oz (79 kg) 175 lb 8 oz (79.6 kg)    Telemetry    NSR, short run of narrow complex tachycardia  - Personally Reviewed  ECG    n/a - Personally Reviewed  Physical Exam   GEN: Chronically ill appearing; No acute distress.     Neck: No JVD. Cardiac: RRR, II/VI systolic murmur RUSB, no rubs, or gallops.  Respiratory: Diminsihed breath sounds bilaterally with faint crackles at the bilateral bases.  GI: Soft, nontender, non-distended.   MS: No edema; No deformity. Neuro:  Alert and oriented x 3; Nonfocal.  Psych: Normal affect.  Labs    Chemistry Recent Labs  Lab 04/22/18 0501 04/23/18 0619 04/24/18 0441  NA 134* 135 134*  K 4.3 4.2 4.8  CL 98 98 97*  CO2 26 27 21*  GLUCOSE 130* 129* 70  BUN 45* 46* 53*  CREATININE 2.08* 2.16* 2.37*  CALCIUM 8.6* 8.8* 9.3  PROT 6.5 6.5 7.0  ALBUMIN 3.1* 3.0* 3.4*  AST 926* 545* 574*  ALT 858* 699* 679*  ALKPHOS 83 86 99  BILITOT 1.6* 1.5* 3.1*  GFRNONAA 24* 23* 21*  GFRAA 28* 27* 24*  ANIONGAP 10 10 16*     Hematology Recent Labs  Lab 04/20/18 0903 04/22/18 0501 04/24/18 0441  WBC 13.8* 9.5 16.1*  RBC 4.99 4.99 5.47*  HGB 9.7* 10.0* 10.7*  HCT 32.5* 33.5* 36.7  MCV 65.1* 67.1* 67.2*  MCH 19.5* 20.0* 19.5*  MCHC 30.0* 29.7* 29.1*  RDW 21.7* 21.7* 21.4*  PLT 245 173 184    Cardiac Enzymes Recent Labs  Lab 04/20/18 0512 04/20/18 1141 04/20/18 1924 04/22/18 1601  TROPONINI 1.44* 1.79* 1.56* 0.45*   No results for input(s): TROPIPOC in the last 168 hours.   BNP Recent Labs  Lab 04/19/18 2215  BNP 1,679.0*     DDimer No results for input(s): DDIMER in the last 168 hours.   Radiology    No results found.  Cardiac Studies   TTE 04/21/2018: Study Conclusions  - Left ventricle: Systolic function was vigorous. The estimated ejection fraction was in the range of 65% to 70%. Doppler parameters are consistent with abnormal left ventricular relaxation (grade 1 diastolic dysfunction). - Ventricular septum: The contour showed diastolic flattening. These changes are consistent with RV volume overload. - Aortic valve: Cusp separation was reduced. Transvalvular velocity was increased. There was moderate stenosis. Mean gradient  (S): 28 mm Hg. - Mitral valve: Calcified annulus. - Right ventricle: The cavity size was severely dilated. Wall thickness was normal. Systolic function was mildly reduced. - Right atrium: The atrium was moderately dilated. - Tricuspid valve: There was severe regurgitation directed eccentrically. - Pulmonary arteries: Systolic pressure was severely increased, estimated to be 115 mm Hg plus central venous/right atrial pressure.   Patient Profile     62 y.o. female with history of COPDandPAH,HTN, HL, NIDDM, GERD, CKD IV, and IBSwho is being seen today for the evaluation ofhypoxia and right heart failurewith multiorgan failure.  Assessment & Plan    1. Acute on chronic respiratory failure with hypoxia: -Likely multifactorial including severe PAH, COPD and RV failure -Remains on baseline 4 L supplemental oxygen via nasal cannula -Feels at her baseline to slightly improved -Did well with ambulation on 6/27, walk again today to assess for worsening SOB, if worsening symptoms, would recommend transfer to [facility] if she continues to want aggressive interventions   2. PAH: -Worsening as noted on echo as above -Revatio has been discontinued after discussion with Dr. Haroldine Laws with no plans to resume given concern for associated hypotension and shunting -Remarkably, she has had a dramatic improvement in her symptoms and labs over the past 48 hours. If she continues to improve and labs stabilize/improve she may be able to follow up with the advanced heart service as an outpatient, otherwise we will need to consider transfer to [facility] this admission -It remains uncertain if she would like to undergo aggressive therapies such as inhaled NO, unlikely to ultimately drastically change her prognosis  -Coagulopathy precludes RHC at this time -Needs to ambulate again in the hallway this morning to assess for symptoms, if she is fairly symptomatic with this she may require transfer to [facility]  3.  Multiorgan failure/transaminitis/shock liver/acute on CKD stage IV: -Liver function improving -Renal function worsening, not on any nephrotoxic medications -Likely related to [place] and RV failure with hypotension augmented possibly by Revatio -Continue supportive care and close monitoring -If symptoms or objective findings worsen, she may require transfer to [facility], though for now, if she continues to improve like she has over the past 48 hours she could follow up with the advanced heart failure service as an outpatient in ~ 1 week -Discussion had with the patient this morning regarding extremely poor prognosis, GOC, and code status.  -Palliative care on board and has spoken with both the patient and her son. The patient continues to process all of the above  4. Elevated troponin: -No prior history of CAD, though coronary calcium noted of prior CT chest -Likely supply demand ischemia in the setting of hypoxia with multiorgan failure  5. COPD: -Complicated by severe PAH and right heart failure -  Pulmonology on board  6. Anemia of chronic disease/iron deficiency anemia: -On PO iron -Stable  7. Leukocytosis: -No obvious signs of infection   For questions or updates, please contact [place] Please consult [email or web address] for contact info under Cardiology/STEMI.    Signed, Christell Faith, PA-C [place] Pager: [phone] 04/24/2018, 8:17 AM

## 2018-04-24 NOTE — Plan of Care (Signed)
  Problem: Health Behavior/Discharge Planning: Goal: Ability to manage health-related needs will improve Outcome: Progressing   Problem: Activity: Goal: Risk for activity intolerance will decrease Outcome: Progressing   Problem: Nutrition: Goal: Adequate nutrition will be maintained Outcome: Progressing   Problem: Coping: Goal: Level of anxiety will decrease Outcome: Progressing   Problem: Pain Managment: Goal: General experience of comfort will improve Outcome: Progressing   Problem: Safety: Goal: Ability to remain free from injury will improve Outcome: Progressing    No complaints of pain this shift, but did receive scheduled methadone. One episode of vomit, pt got a cracker stuck in her throat though she stated.

## 2018-04-26 DIAGNOSIS — J441 Chronic obstructive pulmonary disease with (acute) exacerbation: Secondary | ICD-10-CM | POA: Diagnosis not present

## 2018-04-26 DIAGNOSIS — R262 Difficulty in walking, not elsewhere classified: Secondary | ICD-10-CM | POA: Diagnosis not present

## 2018-04-26 DIAGNOSIS — Z79899 Other long term (current) drug therapy: Secondary | ICD-10-CM | POA: Diagnosis not present

## 2018-04-27 ENCOUNTER — Encounter: Payer: Self-pay | Admitting: *Deleted

## 2018-04-27 ENCOUNTER — Other Ambulatory Visit: Payer: Self-pay | Admitting: *Deleted

## 2018-04-27 ENCOUNTER — Ambulatory Visit (HOSPITAL_COMMUNITY)
Admission: RE | Admit: 2018-04-27 | Discharge: 2018-04-27 | Disposition: A | Discharge status: Home / Self Care | Payer: PPO | Source: Ambulatory Visit | Attending: Internal Medicine | Admitting: Internal Medicine

## 2018-04-27 ENCOUNTER — Telehealth: Payer: Self-pay | Admitting: Internal Medicine

## 2018-04-27 ENCOUNTER — Telehealth: Payer: Self-pay

## 2018-04-27 VITALS — BP 145/71 | HR 82 | Wt 173.8 lb

## 2018-04-27 DIAGNOSIS — I2510 Atherosclerotic heart disease of native coronary artery without angina pectoris: Secondary | ICD-10-CM | POA: Insufficient documentation

## 2018-04-27 DIAGNOSIS — I2781 Cor pulmonale (chronic): Secondary | ICD-10-CM

## 2018-04-27 DIAGNOSIS — E785 Hyperlipidemia, unspecified: Secondary | ICD-10-CM | POA: Diagnosis not present

## 2018-04-27 DIAGNOSIS — Z87891 Personal history of nicotine dependence: Secondary | ICD-10-CM | POA: Diagnosis not present

## 2018-04-27 DIAGNOSIS — Z8041 Family history of malignant neoplasm of ovary: Secondary | ICD-10-CM | POA: Diagnosis not present

## 2018-04-27 DIAGNOSIS — Z7982 Long term (current) use of aspirin: Secondary | ICD-10-CM | POA: Insufficient documentation

## 2018-04-27 DIAGNOSIS — R262 Difficulty in walking, not elsewhere classified: Secondary | ICD-10-CM | POA: Diagnosis not present

## 2018-04-27 DIAGNOSIS — E871 Hypo-osmolality and hyponatremia: Secondary | ICD-10-CM | POA: Insufficient documentation

## 2018-04-27 DIAGNOSIS — Z801 Family history of malignant neoplasm of trachea, bronchus and lung: Secondary | ICD-10-CM | POA: Insufficient documentation

## 2018-04-27 DIAGNOSIS — E669 Obesity, unspecified: Secondary | ICD-10-CM | POA: Diagnosis not present

## 2018-04-27 DIAGNOSIS — N184 Chronic kidney disease, stage 4 (severe): Secondary | ICD-10-CM | POA: Insufficient documentation

## 2018-04-27 DIAGNOSIS — J9621 Acute and chronic respiratory failure with hypoxia: Secondary | ICD-10-CM | POA: Insufficient documentation

## 2018-04-27 DIAGNOSIS — J449 Chronic obstructive pulmonary disease, unspecified: Secondary | ICD-10-CM | POA: Diagnosis not present

## 2018-04-27 DIAGNOSIS — E1140 Type 2 diabetes mellitus with diabetic neuropathy, unspecified: Secondary | ICD-10-CM | POA: Insufficient documentation

## 2018-04-27 DIAGNOSIS — I130 Hypertensive heart and chronic kidney disease with heart failure and stage 1 through stage 4 chronic kidney disease, or unspecified chronic kidney disease: Secondary | ICD-10-CM | POA: Insufficient documentation

## 2018-04-27 DIAGNOSIS — E1122 Type 2 diabetes mellitus with diabetic chronic kidney disease: Secondary | ICD-10-CM | POA: Diagnosis not present

## 2018-04-27 DIAGNOSIS — F329 Major depressive disorder, single episode, unspecified: Secondary | ICD-10-CM | POA: Insufficient documentation

## 2018-04-27 DIAGNOSIS — I2721 Secondary pulmonary arterial hypertension: Secondary | ICD-10-CM | POA: Diagnosis not present

## 2018-04-27 DIAGNOSIS — K219 Gastro-esophageal reflux disease without esophagitis: Secondary | ICD-10-CM | POA: Diagnosis not present

## 2018-04-27 DIAGNOSIS — I272 Pulmonary hypertension, unspecified: Secondary | ICD-10-CM | POA: Diagnosis not present

## 2018-04-27 DIAGNOSIS — Z833 Family history of diabetes mellitus: Secondary | ICD-10-CM | POA: Insufficient documentation

## 2018-04-27 DIAGNOSIS — J441 Chronic obstructive pulmonary disease with (acute) exacerbation: Secondary | ICD-10-CM | POA: Diagnosis not present

## 2018-04-27 DIAGNOSIS — Z79899 Other long term (current) drug therapy: Secondary | ICD-10-CM | POA: Insufficient documentation

## 2018-04-27 MED ORDER — TORSEMIDE 20 MG PO TABS: 40.0000 mg | ORAL_TABLET | Freq: Two times a day (BID) | ORAL | 3 refills | Status: DC

## 2018-04-27 NOTE — Telephone Encounter (Signed)
LM (okay per DPR) for patient to please call regarding her hospital follow up and need for f/u appt within the week.

## 2018-04-27 NOTE — Progress Notes (Signed)
Patient ID: Lindsay Harmon, female   DOB: December 26, 1955, 62 y.o.   MRN: [id number]    Advanced Heart Failure Clinic Note   PCP: Dr Silvio Pate  Primary HF  Cardiologist: Dr Haroldine Laws Pulmonary: [name]   HPI: Lindsay Harmon is a 62 y.o. female with history of HTN, DM, IBS, depression, GERD, severe COPD (FEV1 0.87L)  and pulmonary hypertension with RV failure/cor pulmonale, and chronic diastolic heart failure.   Admitted in 8/16 with volume overload and severe hyponatremia. Diuresed with IV lasix and transtioned back torsemide and metolazone as needed. Discharge weight 153 pounds.   Admitted to [facility] in 12/16 with volume depletion and hyponatremia with Na 113. Hydrated. Has since followed up with Dr. Anthonette Legato in Renal. Now on torsemide 20 bid and occasional metolazone.   Admitted to [facility] 6/23-6/28 with acute hypoxic respiratory failure and AKI. Thought to be related to severe pulmonary HTN. Sildenfil was stopped. ACE was stopped due to hypotension. Torsemide was also stopped. Weaned to 4 L nasal cannula. Palliative was consulted.   [name] called pt today and asked her to restart sildenafil and to also start Tyvaso.   She presents today for post hospital follow up. Overall doing okay. Breathing is okay today. Not SOB with ADLs if she goes slowly. Sleeps sitting up at baseline. No edema or PND. No dizziness or presyncope. No fever/cough/chills. Appetite improving. Taking medications as directed. Weights at home ~173 lbs. [facility] follows her for home health RN and PT. Her HHRN is contacting her PCP about getting back on her diabetes medications (there were stopped while inpatient). Was discharged from the hospital off all diuretics. ACE and DM2 meds also stopped. Weight up 9 pounds. + edema   RHC 01/22/18 Findings: RA = 11 RV = 92/12 PA = 92/33 (57) PCW = 15 Fick cardiac output/index = 5.9/3.3 PVR = 7.2 WU Ao sat = 93% PA sat = 62%, 64%   RHC 12/05/2014 RA 12 mmHg (mean) with O2 sat 72%;  RV 97/16 mmHg with O2 sat 73%; PA 97/30 mmHg with O2 sat 67%; PCWP(mean) 14 mmHg (mean); Cardiac Output 5.78 L/min  PFTs  11/2014 with severe COPD  FEV1 0.87 (38%) FVC 1.41 (48%) FEF 25-75 0.41 (19%) Unable to do DLCO  March 2019  Simple spirometry ratio 67% FEV1 1.21 L+ 53% predicted FVC 1.79 L 61% predicted   Echo  11/2014 EF 60-65%, aortic sclerosis, RV moderately dilated/moderately decreased systolic function, PASP 71 mmHg  09/30/2017 LVEF 55-60%, Grade 1 DD, Mild/Mod AS Mean gradient 16 mmHg, Peak PA pressure 115 Hg  Review of systems complete and found to be negative unless listed in HPI.    SH:  Social History   Socioeconomic History  . Marital status: Legally Separated    Spouse name: Not on file  . Number of children: 1  . Years of education: Not on file  . Highest education level: Not on file  Occupational History  . 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 Financial resource strain: Not on file  . Food insecurity:    Worry: Not on file    Inability: Not on file  . Transportation needs:    Medical: Not on file    Non-medical: Not on file  Tobacco Use  . Smoking status: Former Smoker    Packs/day: 1.50    Years: 20.00    Pack years: 30.00    Types: Cigarettes  Last attempt to quit: 01/04/2009    Years since quitting: 9.3  . Smokeless tobacco: Never Used  Substance and Sexual Activity  . Alcohol use: No    Alcohol/week: 0.0 oz    Comment: heavy in the past  . Drug use: No  . Sexual activity: Never  Lifestyle  . Physical activity:    Days per week: Not on file    Minutes per session: Not on file  . Stress: Not on file  Relationships  . Social connections:    Talks on phone: Not on file    Gets together: Not on file    Attends religious service: Not on file    Active member of club or organization: Not on file    Attends meetings of clubs or organizations: Not on file    Relationship status: Not  on file  . Intimate partner violence:    Fear of current or ex partner: Not on file    Emotionally abused: Not on file    Physically abused: Not on file    Forced sexual activity: Not on file  Other Topics Concern  . Not on file  Social History Narrative   No living will   Son [name] (then mom) should make decisions for her if she is unable.    Would accept resuscitation attempts but no prolonged ventilation   Not sure about tube feeds but probably wouldn't want them if cognitively unaware   FH:  Family History  Problem Relation Age of Onset  . Diabetes Mother   . Cancer Mother        ovarian,melanoma  . Allergies Father   . Cancer Father        lung  . Cancer Maternal Grandmother        uterine  . Emphysema Paternal Grandfather   . Allergies Sister   . Breast cancer Neg Hx     Past Medical History:  Diagnosis Date  . Allergy   . Aortic atherosclerosis ([place])   . Arthritis   . CKD (chronic kidney disease), stage IV ([place])   . COPD (chronic obstructive pulmonary disease) ([place])    a. 11/2014 PFT's: FEV1 0.87 (38%), FVC 1.41 (48%), FEF 25-75 0.41 (19%). Unable to perform DLCO; b. 12/2017 Simple spirometry ration 67%. FEV1 1.21 L+53% predicted. FVC 1.79L 61% predicted.  . Coronary artery calcification seen on CT scan    a. 03/2017 CT Chest.  . Depression   . Diabetic neuropathy ([place])   . Esophageal stricture   . Familial hematuria   . GERD (gastroesophageal reflux disease)   . Hyperlipidemia   . Hypertension   . IBS (irritable bowel syndrome)   . Nephrolithiasis   . NIDDM (non-insulin dependent diabetes mellitus)   . Obesity, unspecified   . Pulmonary hypertension ([place])    a. WHO Group 3; b. 10/2016 Echo: EF 55-60%, no rwma, Gr1 DD, mild to mod AS, mean grad 102mHg, mildly dil RV w/ mildly reduced RV fxn, mildly dil RA. PASP 1138mg; c. 12/2017 RHC: RA 11, RV 92/12, PA 92/33(57), PCWP 15, Fick CO/CI 5.9/3.3. PVR 7.2 WU. Ao 93%, PA 62/64%.  . Marland KitchenVD (peripheral vascular disease)  ([place]   Current Outpatient Medications  Medication Sig Dispense Refill  . aspirin 325 MG tablet Take 325 mg by mouth at bedtime.     . calcitRIOL (ROCALTROL) 0.5 MCG capsule Take 0.5 mcg by mouth daily.     . cholecalciferol (VITAMIN D) 1000 units tablet Take 1,000 Units by  mouth at bedtime.     . ferrous sulfate 325 (65 FE) MG tablet Take 1 tablet (325 mg total) by mouth 2 (two) times daily with a meal. 60 tablet 3  . lovastatin (MEVACOR) 40 MG tablet Take 2 tablets (80 mg total) by mouth at bedtime. 180 tablet 3  . methadone (DOLOPHINE) 10 MG tablet Take 2 tablets (20 mg total) by mouth 4 (four) times daily. 240 tablet 0  . metoprolol tartrate (LOPRESSOR) 25 MG tablet Take 1/2 tablet by mouth twice a day 90 tablet 3  . mirtazapine (REMERON) 30 MG tablet Take 15 mg by mouth at bedtime.    . nitroGLYCERIN (NITROSTAT) 0.4 MG SL tablet Place 1 tablet (0.4 mg total) under the tongue every 5 (five) minutes as needed for chest pain. 100 tablet 1  . ondansetron (ZOFRAN ODT) 4 MG disintegrating tablet Take 1 tablet (4 mg total) by mouth every 8 (eight) hours as needed for nausea or vomiting. 20 tablet 0  . pantoprazole (PROTONIX) 40 MG tablet Take 1 tablet by mouth twice a day 180 tablet 3  . senna-docusate (SENOKOT-S) 8.6-50 MG tablet Take 1 tablet by mouth at bedtime as needed for mild constipation. 25 tablet 0  . STIOLTO RESPIMAT 2.5-2.5 MCG/ACT AERS Inhale 2 puffs into the lungs daily. 4 g 5  . VENTOLIN HFA 108 (90 Base) MCG/ACT inhaler USE 2 PUFFS EVERY SIX HOURS AS NEEDED FOR WHEEZING OR SHORTNESS OF BREATH 18 g 3   No current facility-administered medications for this encounter.    Vitals:   04/27/18 1113  BP: (!) 145/71  Pulse: 82  SpO2: 96%  Weight: 173 lb 12.8 oz (78.8 kg)   Wt Readings from Last 3 Encounters:  04/27/18 173 lb 12.8 oz (78.8 kg)  04/24/18 175 lb 8 oz (79.6 kg)  04/13/18 167 lb (75.8 kg)    PHYSICAL EXAM: General: Appears chronically ill. No resp  difficulty. HEENT: Normal anicteric Neck: Supple. JVP 8. Carotids 2+ bilat; no bruits. No thyromegaly or nodule noted. Cor: PMI nondisplaced. RRR, 2/6 systolic murmur at LUSB No s3 Lungs: CTAB, normal effort. Decreased BS throughout. No wheeze On 5 L [place] Abdomen: Soft, non-tender, non-distended, no HSM. No bruits or masses. +BS  Extremities: No cyanosis, clubbing, or rash. R and LLE 2 + edema Neuro: Alert & orientedx3, cranial nerves grossly intact. moves all 4 extremities w/o difficulty. Affect pleasant   ASSESSMENT & PLAN: 1. Chronic Diastolic HF - Echo 87/03/8114 LVEF 55-60%, Grade 1 DD, Mild/Mod AS Mean gradient 16 mmHg, Peak PA pressure 115 Hg - Volume status elevated. Up 7 lbs from last visit in April - Restart torsemide 40 mg daily. Take 60 mg today. Will have [place] check BMET in 1 week and in 2 weeks.  - Reinforced fluid restriction to < 2 L daily, sodium restriction to less than 2000 mg daily, and the importance of daily weights.   - Renal following for CKD IV.  -2. PAH with cor pulmonale  - likely Who Group 3. Not candidate for selective pulmonary vasodilators - Primarily WHO Group 3 with chronic hypoxic lung disease, but PA pressures out of proportion to her reduction in FEV1. Suspect component of FEV1. - No longer on sildenfail - there was concern for shunting, so will keep her off for now. - Dr Haroldine Laws spoke with [name] this morning. She will start Tyvaso (she has at home now) and stay off sildenafil - As above with elevated Peak PA pressure on Echo.  -  Peak PA pressure 92 on RHC 01/22/18, has been chronically high.  3. Severe COPD - 5 liters with exertion and 4 liters at home. No change.  - FVC 1.79 (61%), FEV1 1.21 (53%)  4. Chronic respiratory failure - Sats stable on 4L at rest, 5L with activity. No change.  5. Hyponatremia  - 133 01/20/18. 6. CKD IV - Creatinine 2.37 04/24/18 - Renal following.  7. Mild/Mod AS - Mean gradient 16 mmHg - Could consider TEE to  quantify further if needed, but would avoid procedures when possible with her lung disease.  - No change.   Restart torsemide 40 mg daily. Take 60 mg today BMET through [facility] in 1 week and in 2 weeks Start Tyvaso.  Georgiana Shore, NP  11:20 AM   Patient seen and examined with the above-signed Advanced Practice Provider and/or Housestaff. I personally reviewed laboratory data, imaging studies and relevant notes. I independently examined the patient and formulated the important aspects of the plan. I have edited the note to reflect any of my changes or salient points. I have personally discussed the plan with the patient and/or family.  She has severe lung disease with related PAH. Sildenafil stopped in the hospital as it was thought to be worsening her shunting. I discussed with Dr. Lake Bells and plan is start Tyvaso. Volume status elevated since stopping torsemide. Will restart at 40 daily. Reinforced need for daily weights and reviewed use of sliding scale diuretics. Will need BMET weekly x 2 weeks.   Glori Bickers, MD  12:48 PM

## 2018-04-27 NOTE — Telephone Encounter (Signed)
Start tyvaso. Restart sildenafil now.  Call the patient and make sure she is taking sildenafil.  She was seen in the hospital at [facility] recently and the last pulmonary note indicated that she should restart this medicine, however the discharge summary indicates that she was told to not take it at home.    She needs an appointment with [place] ASAP to sort all this out.

## 2018-04-27 NOTE — Telephone Encounter (Signed)
Hospital d/c her Lantus, glipizide, loratidine, and gabapentin and other meds but she was concerned with these 4 in particular. Fasting blood sugar yesterday am was 189. The evening before her blood sugar was over 200 so she took 10 units of lantus she had at home already.  Needs to know what she should do with her meds.

## 2018-04-27 NOTE — Telephone Encounter (Signed)
Left message to call office. Can she give more clarification as to what medications she is questioning?

## 2018-04-27 NOTE — Telephone Encounter (Signed)
Best number [phone] [name] @ [facility]  Pt was admitted to there services yesterday and has ? About her meds being discontinued  Please advise

## 2018-04-27 NOTE — Telephone Encounter (Signed)
Left message for patient to call back.

## 2018-04-27 NOTE — Patient Outreach (Signed)
[facility]) Care Management [facility] Community CM Telephone Outreach [facility] Community CM Telephone Outreach Care Coordination x 3 PCP completes transition of care outreach post-hospital discharge Post-hospital discharge day 3  04/27/2018  Lindsay Harmon [birth year] [id number]  Successful telephone outreach to Lindsay Harmon, 62 y/o female referred to [facility] after recent hospitalization June 23-28, 2019 for hypoxic respiratory failure secondary to severe pulmonary HTN; Patient has history including, but not limited to, dCHF; COPD, on home O2 at 4-5 L/min via [place]; DM- Type II; CKD- stage IV; pulmonary HTN; HTN; PVD; and GERD.  HIPAA/ identity verified during telephone call today, and [place] services were discussed with patient, who provides verbal consent for [place] involvement in her care.  Today, patient states that she "is doing better than" she "thought" she would be post-hospital discharge; states, "things are coming together pretty good."  Reports ongoing/ unchanged chronic back pain at "4/10" stating that "is always what it is;" reports taking prescribed medication for pain, which "helps some."  Reports that she has had back pain "for years" and denies changes to pain level, or new pain sites, post-most recent hospital discharge.  Patient sounds to be in no distress throughout 35 minute phone call today.  Medications: -- Has all medicationsand takes as prescribed;denies questions/ concerns around current medications.  -- self-manage medications using weekly pill planner box. -- denies issues with swallowing medications -- declines medication review by phone today, stating she is tired after visiting with [facility] provider this morning-- discussed/ confirmed with patient that she understands newly-re-started torsemide dosing post- office visit today; in doing so, noted a discrepancy between post-office visit instructions and provider note-- in note/ medication  list in EMR, provider advises that patient should be taking Torsemide 40 mg BID-- however, in post-office visit instructions, patient is prescribed 40 mg po QD; patient believes that provider verbally told her to take 40 mg only once daily.  Patient confirms that she has taken extra 20 mg torsemide today (total dose of 60 mg) today, as advised by [facility] provider during office visit today  Care Coordination Outreach x 3: 2:45 pm:  Contacted [facility] [phone]) and left voice message, requesting call back to clarify torsemide dosing 3:00 pm:  Was contacted back by [facility], "Jasmine," who confirmed that patient should be taking Torsemide 40 mg po QD-- only once daily: re-contacted patient and left HIPAA compliant voice message confirming this dose  Home health [facility]) services: -- [place] services for PT, CNA, RN in place through [facility] -- confirms that [facility] services have begun and that she has phone numbers for [facility] team; nurse visited over weekend, PT expected to visit later this afternoon  -- reports visits "going fine;" encouraged patient to actively participate in all [facility] disciplines; she verbalizes understanding/ agreement  Provider appointments: -- All recent/ upcoming provider appointments were reviewed with patient today-- noted that patient does not currently have scheduled post-hospital discharge PCP office visit scheduled; patient was encouraged to schedule this visit promptly and she agreed to do so today  Safety/ Mobility/ Falls: -- denies new/ recent falls -- assistive devices: currently using walker "all the time;" reports "weak" after hospital visit; states prior to this hospital visit, she did not use assistive devices regularly -- general fall risks/ prevention education discussed with patient today  Social/ Community Resource needs: -- currently denies community resource needs, stating supportive family members that live nearby and assist with care needs as indicated -- family provides  transportation  for patient to all provider appointments, errands, etc -- currently staying with her mother, who lives right next door to her; normally lives alone-- eventually plans to return home to her home  Advanced Directive (AD) Planning:   --reports does not currently have exisisting AD in place; reports that she has these documents and is in process of completing-- denies specific questions today  Self-health management of chronic disease state of CHF: -- endorses has had CHF "for about 3 years" -- confirms that she monitors/ records daily weights as a part of her daily routine; verbalizes weight gain guidelines in setting of CHF to report weight gain > 3 lbs overnight/ 5 lbs in one week to care provider -- follows "heart healthy" diet, adheres to sodium/ fluid restrictions -- uses home O2 at 4-5 L/min via [place]/ oxygenator; reports "has used home O2 for about 2 years" -- denies shortness of breath post-hospital discharge -- reports "some minor" swelling in legs; states at baseline with swelling, but hoping that the "extra fluid pill" she took today "will help"  Patient denies further issues, concerns, or problems today.  I provided/ confirmed that patient has my direct phone number, the main [facility] CM office phone number, and the [facility] CM 24-hour nurse advice phone number should issues arise prior to next scheduled [place] outreach by phone next week.  Encouraged patient to contact me directly if needs, questions, issues, or concerns arise prior to next scheduled outreach; patient agreed to do so.  Plan:  Patient will take medications as prescribed and will attend all scheduled provider appointments  Patient will promptly schedule post-hospital discharge office visit with PCP  Patient will promptly notify care providers for any new concerns/ issues/ problems that arise  Patient will actively participate in home health services as ordered post-hospital discharge  Patient will  continue monitoring/ recording daily weights   I will make patien't PCP aware of [place] RN CM involvement in patient's care-- will send barriers letter  [place] outreach to continue with scheduled phone call next week   [facility] CM Care Plan Problem One     Most Recent Value  Care Plan Problem One  High Risk for hospital readmission related to/ as evidenced by recent hospitalization for ARF/ pulmonary HTN/ CHF exacerbation  Role Documenting the Problem One  Care Management Coordinator  Care Plan for Problem One  Active  [facility] Long Term Goal   Over the next 31 days, patient will not experience hospital re-admission, as evidenced by patient reporting and review of EMR during [place] outreach  [place] Term Goal Start Date  04/27/18  Interventions for Problem One Long Term Goal  Discussed with patient current clinical condition post-hospital discharge,  reviewed office visit notes from today's heart failure clinic office visit and placed care coordination telephone call to [facility] to clarify dosing of patient's diuretic  [facility] CM Short Term Goal #1   Over the next 10 days, patient will schedule post- hospital discharge appointment with PCP, as evidenced by patient reporting and review of EMR during [facility] RN CCM outreach   [facility] CM Short Term Goal #1 Start Date  04/27/18  Interventions for Short Term Goal #1  Discussed value of prompt PCP office visit post-hospital discharge,  encouraged patient to scheduled this visit  [facility] CM Short Term Goal #2   Over the next 30 days, patient will actively participate in home health services as ordered post- hospital discharge, as evidenced by patient reporting  and collaboration with home health team as indicated  [facility] CM Short Term Goal #2 Start Date  04/27/18  Interventions for Short Term Goal #2  Confirmed that patient has phone numbers for home health team and that she has had home health nurse visit over weekend,  confirmed that patient has home health PT  visit scheduled this afternoon,  discussed with patient difference between home health and [facility] CCM services    [facility] CM Care Plan Problem Two     Most Recent Value  Care Plan Problem Two  Need for reinforcement of self-health management strategies for chronic disease state of CHF, as evidenced by patient reporting  Role Documenting the Problem Two  Care Management Coordinator  Care Plan for Problem Two  Active  Interventions for Problem Two Long Term Goal   Reinforced rationale for daily weights in self-health management of CHF,  discussed weight gain guidelines with patient and confirmed that patient continues monitoring and recording daily weights  [facility] Long Term Goal  Over the next 60 days, patient will continue monitoring and recording daily weights, as evidenced by patient reporting and review of same during [place] outreach  [facility] Long Term Goal Start Date  04/27/18     I appreciate the opportunity to participate in Ms. Merriweather's care,  Oneta Rack, RN, BSN, [place] Coordinator [facility] Care Management  [phone]

## 2018-04-27 NOTE — Telephone Encounter (Signed)
Spoke with Lindsay Harmon. She is aware that BQ wants the patient to start Tyvaso. Patient was seen by Dr. Haroldine Laws this morning and he advised that he wanted her to be back on the medication as well.

## 2018-04-27 NOTE — Patient Instructions (Signed)
Restart Torsemide, take 60 mg (3 tabs) TODAY, then take 40 mg (2 tabs) daily  CALL us ON Wednesday AM IF YOU HAVE NOT LOST ANY WEIGHT  Labs in 1 week and again in 2 weeks, this can be done by your Home Health Nurse  Your physician recommends that you schedule a follow-up appointment in: 3 months

## 2018-04-27 NOTE — Telephone Encounter (Signed)
Detailed message left for [name] to advise pt.

## 2018-04-27 NOTE — Telephone Encounter (Signed)
Have her restart the lantus  Loratadine can be prn Hold off on the glipizide unless sugars persistently over 150-160 If no pain issues--stay off the gabapentin

## 2018-04-28 ENCOUNTER — Other Ambulatory Visit (HOSPITAL_COMMUNITY): Payer: Self-pay | Admitting: *Deleted

## 2018-04-28 MED ORDER — TORSEMIDE 20 MG PO TABS: 40.0000 mg | ORAL_TABLET | Freq: Every day | ORAL | 3 refills | Status: DC

## 2018-04-28 NOTE — Telephone Encounter (Signed)
[name] requesting return call to complete TCM and confirm hosp f/u appt. Advised pt that per Dr Silvio Pate, she is needing to be seen within one week.

## 2018-05-01 ENCOUNTER — Telehealth (HOSPITAL_COMMUNITY): Payer: Self-pay

## 2018-05-01 ENCOUNTER — Telehealth (HOSPITAL_COMMUNITY): Payer: Self-pay | Admitting: Cardiology

## 2018-05-01 ENCOUNTER — Other Ambulatory Visit (HOSPITAL_COMMUNITY): Payer: Self-pay | Admitting: Cardiology

## 2018-05-01 NOTE — Telephone Encounter (Signed)
[name], RN with [facility] called to report weight change, reports weight is down 27lbs weight today 147.6 B/p 120/70 Patient is asymptomatic  Per vo DR Bensimhon Hold torsemide x 2 days, resume normal dose on Monday   Patient aware via [facility]

## 2018-05-01 NOTE — Telephone Encounter (Signed)
Patient called to update weight since starting Torsemide Monday Monday 173.8 lbs Friday (Today) 147 lbs 25-30 lb weight loss in 5 days. Per Dr. Haroldine Laws advised to hold diuretics for two days, then resume torsemide to normal dialy dose of 40 mg once daily. HHRN to return monday to follow up and check BMET. Patient asymptomatic, aware and agreeable.  Renee Pain, RN

## 2018-05-04 ENCOUNTER — Encounter: Payer: Self-pay | Admitting: Internal Medicine

## 2018-05-04 ENCOUNTER — Ambulatory Visit (INDEPENDENT_AMBULATORY_CARE_PROVIDER_SITE_OTHER): Payer: PPO | Admitting: Internal Medicine

## 2018-05-04 VITALS — BP 138/84 | HR 96 | Temp 98.7°F | Ht 61.0 in | Wt 150.0 lb

## 2018-05-04 DIAGNOSIS — F39 Unspecified mood [affective] disorder: Secondary | ICD-10-CM | POA: Diagnosis not present

## 2018-05-04 DIAGNOSIS — I441 Atrioventricular block, second degree: Secondary | ICD-10-CM | POA: Diagnosis not present

## 2018-05-04 DIAGNOSIS — K7290 Hepatic failure, unspecified without coma: Secondary | ICD-10-CM | POA: Diagnosis not present

## 2018-05-04 DIAGNOSIS — E1151 Type 2 diabetes mellitus with diabetic peripheral angiopathy without gangrene: Secondary | ICD-10-CM | POA: Diagnosis not present

## 2018-05-04 DIAGNOSIS — IMO0002 Reserved for concepts with insufficient information to code with codable children: Secondary | ICD-10-CM

## 2018-05-04 DIAGNOSIS — I50810 Right heart failure, unspecified: Secondary | ICD-10-CM | POA: Diagnosis not present

## 2018-05-04 DIAGNOSIS — E1165 Type 2 diabetes mellitus with hyperglycemia: Secondary | ICD-10-CM | POA: Diagnosis not present

## 2018-05-04 NOTE — Assessment & Plan Note (Signed)
Will have her restart both the glipizide and lantus daily (unless sugars go under 120 regularly)

## 2018-05-04 NOTE — Patient Instructions (Signed)
Please take both the insulin and glipizide in the morning. I would just recommend holding the glipizide if your sugar is under 120.

## 2018-05-04 NOTE — Assessment & Plan Note (Signed)
Seems to be the primary problem Related to pulmonary HTN Now on new inhaled medication for vasodilation---seems better and fairly massive diuresis Continues with Dr Sung Amabile

## 2018-05-04 NOTE — Progress Notes (Signed)
Subjective:    Patient ID: Lindsay Harmon, female    DOB: December 06, 1955, 62 y.o.   MRN: [id number]  HPI Here for hospital follow up--with sister  Martin Majestic to hospital--- oxygen down in 63's Felt really bad---but not really that SOB (but not doing anything--just on couch) Most noticeable was nausea  No chest pain--but had slight brief pressure No palpitations No abdominal pain Did have some increase in edema  Had started the sildenafil about a month before this  Diagnosed with shock liver--- sildenafil stopped Multiple consultations Ongoing renal disease as well Many other meds stopped as well--including diabetic meds  Did go back to Dr Sung Amabile Started back on torsemide 82m daily Started tyvaso (trepostinil) inhaled 4 times a day Weight down 27# since then  Had stopped the DM meds Now checking bid Using lantus if over 160 in AM, glipizide if over 160 before dinner 143-267 No low sugars  Had renal profile drawn by home health today  Hospice was discussed in hospital Holding off for now Mood seems to be okay  Current Outpatient Medications on File Prior to Visit  Medication Sig Dispense Refill  . aspirin 325 MG tablet Take 325 mg by mouth at bedtime.     . calcitRIOL (ROCALTROL) 0.5 MCG capsule Take 0.5 mcg by mouth daily.     . cholecalciferol (VITAMIN D) 1000 units tablet Take 1,000 Units by mouth at bedtime.     . ferrous sulfate 325 (65 FE) MG tablet Take 1 tablet (325 mg total) by mouth 2 (two) times daily with a meal. 60 tablet 3  . glipiZIDE (GLUCOTROL) 5 MG tablet Take 5 mg by mouth See admin instructions. Take 1 tablet is blood sugar is over 160.    .Marland Kitcheninsulin glargine (LANTUS) 100 UNIT/ML injection Inject 20 Units into the skin daily.    .Marland Kitchenlovastatin (MEVACOR) 40 MG tablet Take 2 tablets (80 mg total) by mouth at bedtime. 180 tablet 3  . methadone (DOLOPHINE) 10 MG tablet Take 2 tablets (20 mg total) by mouth 4 (four) times daily. 240 tablet 0  . metoprolol  tartrate (LOPRESSOR) 25 MG tablet Take 1/2 tablet by mouth twice a day 90 tablet 3  . mirtazapine (REMERON) 30 MG tablet Take 15 mg by mouth at bedtime.    . nitroGLYCERIN (NITROSTAT) 0.4 MG SL tablet Place 1 tablet (0.4 mg total) under the tongue every 5 (five) minutes as needed for chest pain. 100 tablet 1  . ondansetron (ZOFRAN ODT) 4 MG disintegrating tablet Take 1 tablet (4 mg total) by mouth every 8 (eight) hours as needed for nausea or vomiting. 20 tablet 0  . pantoprazole (PROTONIX) 40 MG tablet Take 1 tablet by mouth twice a day 180 tablet 3  . STIOLTO RESPIMAT 2.5-2.5 MCG/ACT AERS Inhale 2 puffs into the lungs daily. 4 g 5  . torsemide (DEMADEX) 20 MG tablet Take 2 tablets (40 mg total) by mouth daily. 180 tablet 3  . VENTOLIN HFA 108 (90 Base) MCG/ACT inhaler USE 2 PUFFS EVERY SIX HOURS AS NEEDED FOR WHEEZING OR SHORTNESS OF BREATH 18 g 3   No current facility-administered medications on file prior to visit.     Allergies  Allergen Reactions  . Sertraline Hcl Other (See Comments)    Cloudy thoughts, malaise    Past Medical History:  Diagnosis Date  . Allergy   . Aortic atherosclerosis ([place]   . Arthritis   . CKD (chronic kidney disease), stage IV ([place]   .  COPD (chronic obstructive pulmonary disease) ([place])    a. 11/2014 PFT's: FEV1 0.87 (38%), FVC 1.41 (48%), FEF 25-75 0.41 (19%). Unable to perform DLCO; b. 12/2017 Simple spirometry ration 67%. FEV1 1.21 L+53% predicted. FVC 1.79L 61% predicted.  . Coronary artery calcification seen on CT scan    a. 03/2017 CT Chest.  . Depression   . Diabetic neuropathy ([place])   . Esophageal stricture   . Familial hematuria   . GERD (gastroesophageal reflux disease)   . Hyperlipidemia   . Hypertension   . IBS (irritable bowel syndrome)   . Nephrolithiasis   . NIDDM (non-insulin dependent diabetes mellitus)   . Obesity, unspecified   . Pulmonary hypertension ([place])    a. WHO Group 3; b. 10/2016 Echo: EF 55-60%, no rwma, Gr1 DD, mild to  mod AS, mean grad 49mHg, mildly dil RV w/ mildly reduced RV fxn, mildly dil RA. PASP 1121mg; c. 12/2017 RHC: RA 11, RV 92/12, PA 92/33(57), PCWP 15, Fick CO/CI 5.9/3.3. PVR 7.2 WU. Ao 93%, PA 62/64%.  . Marland KitchenVD (peripheral vascular disease) ([place]    Past Surgical History:  Procedure Laterality Date  . ABDOMINAL HYSTERECTOMY    . CARPAL TUNNEL RELEASE    . CESAREAN SECTION    . ERCP N/A 03/17/2017   Procedure: ENDOSCOPIC RETROGRADE CHOLANGIOPANCREATOGRAPHY (ERCP);  Surgeon: WoLucilla LameMD;  [facility];  Service: Endoscopy;  Laterality: N/A;  . EUS N/A 03/13/2017   Procedure: FULL UPPER ENDOSCOPIC ULTRASOUND (EUS) RADIAL;  Surgeon: Burbridge, ReMurray HodgkinsMD;  [facility];  Service: Endoscopy;  Laterality: N/A;  . RIGHT HEART CATH N/A 01/22/2018   Procedure: RIGHT HEART CATH;  Surgeon: BeJolaine ArtistMD;  [facility];  Service: Cardiovascular;  Laterality: N/A;  . RIGHT HEART CATHETERIZATION N/A 12/05/2014   Procedure: RIGHT HEART CATH;  Surgeon: HeSinclair GroomsMD;  [facility];  Service: Cardiovascular;  Laterality: N/A;    Family History  Problem Relation Age of Onset  . Diabetes Mother   . Cancer Mother        ovarian,melanoma  . Allergies Father   . Cancer Father        lung  . Cancer Maternal Grandmother        uterine  . Emphysema Paternal Grandfather   . Allergies Sister   . Breast cancer Neg Hx     Social History   Socioeconomic History  . Marital status: Legally Separated    Spouse name: Not on file  . Number of children: 1  . Years of education: Not on file  . Highest education level: Not on file  Occupational History  . 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 Financial resource strain: Not on file  . Food insecurity:    Worry: Not on file    Inability: Not on file  . Transportation needs:    Medical: Not on file    Non-medical: Not on file  Tobacco Use   . Smoking status: Former Smoker    Packs/day: 1.50    Years: 20.00    Pack years: 30.00    Types: Cigarettes    Last attempt to quit: 01/04/2009    Years since quitting: 9.3  . Smokeless tobacco: Never Used  Substance and Sexual Activity  . Alcohol use: No    Alcohol/week: 0.0 oz    Comment: heavy in the past  . Drug use: No  .  Sexual activity: Never  Lifestyle  . Physical activity:    Days per week: Not on file    Minutes per session: Not on file  . Stress: Not on file  Relationships  . Social connections:    Talks on phone: Not on file    Gets together: Not on file    Attends religious service: Not on file    Active member of club or organization: Not on file    Attends meetings of clubs or organizations: Not on file    Relationship status: Not on file  . Intimate partner violence:    Fear of current or ex partner: Not on file    Emotionally abused: Not on file    Physically abused: Not on file    Forced sexual activity: Not on file  Other Topics Concern  . Not on file  Social History Narrative   No living will   Son [name] (then mom) should make decisions for her if she is unable.    Would accept resuscitation attempts but no prolonged ventilation   Not sure about tube feeds but probably wouldn't want them if cognitively unaware   Review of Systems Appetite was off Bowels about the same Weighing daily Sleeping okay--- no PND    Objective:   Physical Exam  Constitutional: No distress.  Neck: No thyromegaly present.  Cardiovascular: Normal rate and regular rhythm. Exam reveals no gallop.  Gr 3/6 blowing systolic murmur along LSB  Respiratory: Effort normal. No respiratory distress. She has no wheezes. She has no rales.  Decreased breath sounds but clear  GI: Soft. There is no tenderness.  No obvious liver enlargement  Musculoskeletal: She exhibits no edema.  Lymphadenopathy:    She has no cervical adenopathy.  Skin:  Ecchymoses especially on arms            Assessment & Plan:

## 2018-05-04 NOTE — Assessment & Plan Note (Signed)
Seems to be okay despite the severity of illness Staying at mom's now--who can help with instrumental ADLs

## 2018-05-04 NOTE — Assessment & Plan Note (Signed)
Marked elevation of liver tests Mildly abnormal appearance on ultrasound Possible gallstone or polyp---but not obstructive pattern Seems to be related to right heart failure Did improve and now clinically better Will recheck labs next time---just had blood draw this morning

## 2018-05-05 ENCOUNTER — Other Ambulatory Visit: Payer: Self-pay | Admitting: *Deleted

## 2018-05-05 ENCOUNTER — Encounter: Payer: Self-pay | Admitting: *Deleted

## 2018-05-05 ENCOUNTER — Telehealth: Payer: Self-pay

## 2018-05-05 NOTE — Patient Outreach (Signed)
[facility]) Care Management [place] Telephone Outreach Post-hospital discharge day 11 05/05/2018  Lindsay Harmon [birth year] [id number]  Successful telephone outreach to Lindsay Harmon, 62 y/o female referred to [place] after recent hospitalization June 23-28, 2019 for hypoxic respiratory failure secondary to severe pulmonary HTN; Patient has history including, but not limited to, dCHF; COPD, on home O2 at 4-5 L/min via [place]; DM- Type II; CKD- stage IV; pulmonary HTN; HTN; PVD; and GERD.  HIPAA/ identity verified during telephone call today.  Today, patient states that she "is doing fine and feeling good."  Reports ongoing/ unchanged chronic back pain, which remains manageable with prescribed medication for pain.  Reports continues temporarily staying at her mother's home while she recuperates, and again explains that she and her mother live right next door to each other.  Patient sounds to be in no distress throughout entirety of phone call today.  Patient further reports:  -- significant weight loss (27 lbs) post- most recent heart failure clinic office visit with changes made to diuretic therapy; states that home health nurse contacted heart failure provider and verbalizes accurate report of instructions given to temporarily stop diuretics over weekend; states she has now resumed diuretic therapy and continued monitoring/ recording daily weights; verbalizes ongoing plans to call heart failure clinic to report weights -- weight today: 150.2 Weights over weekend: 7/8: 152.2 lbs (re-started torsemide) 7/7: 150.8 lbs 7/6: 148.8 lbs 7/5: 147.6lbs 7/4: 151 lbs -- denies new/ recent falls post hospital discharge; continues using walker 'all the time" -- home health services remain active; patient endorses active participation with RN and PT: states visits going well; reports "both RN and PT visited her yesterday" around her scheduled PCP provider office visit.   Positive reinforcement provided. -- Provider appointments:  Attended PCP office visit yesterday; states "visit went well," adding that oral diabetes medication was "added back to medication list" and accurately verbalized that she was instructed not to take for morning blood sugars less than 100; all recent and upcoming appointments were reviewed with patient today; family will continue to provide transportation to all scheduled appointments  Self-health management of chronic disease state of CHF: -- confirms that she has continued monitoring/ recording daily weights as a part of her daily routine, as above; again verbalizes accurate weight gain guidelines in setting of CHF to report weight gain > 3 lbs overnight/ 5 lbs in one week to care provider -- continues to follow "heart healthy" diet, adheres to sodium/ fluid restrictions -- uses home O2 at 4-5 L/min via [place]/ oxygenator -- denies shortness of breath post-hospital discharge -- reports ongoing "minor" swelling in legs; states at "hardly none now" with recent weight loss, as noted above  Patient denies further issues, concerns, or problems today. I confirmed that patient hasmy direct phone number, the main [facility] CM office phone number, and the [facility] CM 24-hour nurse advice phone number should issues arise prior to next scheduled [place] outreach with initial home visit next week, which was scheduled today.  Encouraged patient to contact me directly if needs, questions, issues, or concerns arise prior to next scheduled outreach; patient agreed to do so.  Plan:  Patient will take medications as prescribed and will attend all scheduled provider appointments  Patient will promptly notify care providers for any new concerns/ issues/ problems that arise  Patient will continue actively participate in home health services as ordered post-hospital discharge  Patient will continue monitoring/ recording daily weights and following  weight  gain guidelines in setting of CHF  [facility] CM outreach to continue with scheduled initial home visit next week  [facility] CM Care Plan Problem One     Most Recent Value  Care Plan Problem One  High Risk for hospital readmission related to/ as evidenced by recent hospitalization for ARF/ pulmonary HTN/ CHF exacerbation  Role Documenting the Problem One  Care Management Coordinator  Care Plan for Problem One  Active  [facility] Long Term Goal   Over the next 31 days, patient will not experience hospital re-admission, as evidenced by patient reporting and review of EMR during [place] outreach  [place] Term Goal Start Date  04/27/18  Interventions for Problem One Long Term Goal  Discussed current clinical status with patient,  reviewed recent and upcoming provider appointments with patient,  scheduled initial [facility] RN CCM home visit with patient for next week  [facility] CM Short Term Goal #1   Over the next 10 days, patient will schedule post- hospital discharge appointment with PCP, as evidenced by patient reporting and review of EMR during [facility] RN CCM outreach   [facility] CM Short Term Goal #1 Start Date  04/27/18  [facility] CM Short Term Goal #1 Met Date  05/05/18 -- Goal met  Interventions for Short Term Goal #1  Confirmed that patient scheduled and attended PCP office visit on 05/04/18,  reviewed visit with patient and confirmed that she had no post-office visit questions/ concerns  [facility] CM Short Term Goal #2   Over the next 30 days, patient will actively participate in home health services as ordered post- hospital discharge, as evidenced by patient reporting and collaboration with home health team as indicated  [facility] CM Short Term Goal #2 Start Date  04/27/18  Interventions for Short Term Goal #2  Reviewed recent home health visits with patient and provided positive reinforcement for her ongoing active participation in all home health services    [facility] CM Care Plan Problem Two     Most Recent Value  Care Plan Problem Two   Need for reinforcement of self-health management strategies for chronic disease state of CHF, as evidenced by patient reporting  Role Documenting the Problem Two  Care Management Coordinator  Care Plan for Problem Two  Active  Interventions for Problem Two Long Term Goal   Reviewed recent daily weights with patient and provided positive reinforcement that patient has made this a part of her daily routine,  discussed weight gain guidelines in setting of CHF and confirmed that patient is able to verbalize action plan for weight gain  [facility] Long Term Goal  Over the next 60 days, patient will continue monitoring and recording daily weights, as evidenced by patient reporting and review of same during [facility] RN CCM outreach  [facility] Long Term Goal Start Date  04/27/18     Oneta Rack, RN, BSN, [place] Coordinator [facility] Care Management  [phone]

## 2018-05-05 NOTE — Telephone Encounter (Signed)
Ledft detailed message on VM per DPR about recent labs. Liver functions were basically back to normal and her kidney functions have improved.

## 2018-05-06 DIAGNOSIS — I509 Heart failure, unspecified: Secondary | ICD-10-CM | POA: Diagnosis not present

## 2018-05-06 DIAGNOSIS — J449 Chronic obstructive pulmonary disease, unspecified: Secondary | ICD-10-CM | POA: Diagnosis not present

## 2018-05-07 ENCOUNTER — Telehealth: Payer: Self-pay | Admitting: Pulmonary Disease

## 2018-05-07 NOTE — Telephone Encounter (Signed)
Perfect, thanks

## 2018-05-07 NOTE — Telephone Encounter (Signed)
Called and spoke with [place]. She states that the patient is doing well on the medication. She has not had any side effects. She states that she went from 3 breaths to 6 breaths today. She would like to let BQ know before her visit.    Sending to BQ as an FYI.

## 2018-05-08 ENCOUNTER — Other Ambulatory Visit (HOSPITAL_COMMUNITY): Payer: Self-pay | Admitting: Internal Medicine

## 2018-05-11 DIAGNOSIS — J441 Chronic obstructive pulmonary disease with (acute) exacerbation: Secondary | ICD-10-CM | POA: Diagnosis not present

## 2018-05-11 DIAGNOSIS — E1122 Type 2 diabetes mellitus with diabetic chronic kidney disease: Secondary | ICD-10-CM | POA: Diagnosis not present

## 2018-05-11 DIAGNOSIS — N184 Chronic kidney disease, stage 4 (severe): Secondary | ICD-10-CM | POA: Diagnosis not present

## 2018-05-11 DIAGNOSIS — Z79899 Other long term (current) drug therapy: Secondary | ICD-10-CM | POA: Diagnosis not present

## 2018-05-11 DIAGNOSIS — I50813 Acute on chronic right heart failure: Secondary | ICD-10-CM | POA: Diagnosis not present

## 2018-05-11 DIAGNOSIS — I130 Hypertensive heart and chronic kidney disease with heart failure and stage 1 through stage 4 chronic kidney disease, or unspecified chronic kidney disease: Secondary | ICD-10-CM | POA: Diagnosis not present

## 2018-05-12 ENCOUNTER — Ambulatory Visit: Payer: PPO | Admitting: Pulmonary Disease

## 2018-05-12 ENCOUNTER — Encounter: Payer: Self-pay | Admitting: Pulmonary Disease

## 2018-05-12 VITALS — BP 136/80 | HR 83 | Ht 61.0 in | Wt 147.0 lb

## 2018-05-12 DIAGNOSIS — J9611 Chronic respiratory failure with hypoxia: Secondary | ICD-10-CM | POA: Diagnosis not present

## 2018-05-12 DIAGNOSIS — I2781 Cor pulmonale (chronic): Secondary | ICD-10-CM

## 2018-05-12 DIAGNOSIS — R0600 Dyspnea, unspecified: Secondary | ICD-10-CM | POA: Diagnosis not present

## 2018-05-12 DIAGNOSIS — J449 Chronic obstructive pulmonary disease, unspecified: Secondary | ICD-10-CM | POA: Diagnosis not present

## 2018-05-12 MED ORDER — TIOTROPIUM BROMIDE-OLODATEROL 2.5-2.5 MCG/ACT IN AERS: 2.0000 | INHALATION_SPRAY | Freq: Every day | RESPIRATORY_TRACT | 5 refills | Status: DC

## 2018-05-12 MED ORDER — ALBUTEROL SULFATE HFA 108 (90 BASE) MCG/ACT IN AERS: INHALATION_SPRAY | RESPIRATORY_TRACT | 3 refills | Status: DC

## 2018-05-12 NOTE — Progress Notes (Addendum)
Subjective:    Patient ID: Lindsay Harmon, female    DOB: 06/19/1956, 62 y.o.   MRN: [id number]   Synopsis: Referred by cardiology in 2016 for evaluation of severe COPD and possible obstructive sleep apnea in the setting of pulmonary hypertension. She also has congestive heart failure. A sleep study in 2016 showed no evidence of obstructive sleep apnea but she does have significant nocturnal hypoxemia. Spirometry testing in 2016 confirmed a diagnosis of COPD with severe airflow obstruction FEV1 41% pred.  She smoked cigarettes for many years and she quit smoking in 2013 after smoking 2 ppd for 40 years.   HPI Chief Complaint  Patient presents with  . Follow-up   Lindsay Harmon says that since starting the Tyvaso she has been breathing better.  She says she feels significantly less chest tightness and she feels less short of breath.  She has also noticed that she does not need to use as much oxygen.  She says her ankle swelling has been well managed with her current dosing of torsemide.  In general she has not had worsening symptoms.  No problems with bronchitis or pneumonia since the last visit.  He needs to use 4 L of oxygen continuously at home on her continuous flow concentrator and 5 L pulse when she is out in public.  She needs refills on her Stiolto which she is using routinely.  She has noticed some generalized itching of her skin but no rash.  Past Medical History:  Diagnosis Date  . Allergy   . Aortic atherosclerosis ([place])   . Arthritis   . CKD (chronic kidney disease), stage IV ([place])   . COPD (chronic obstructive pulmonary disease) ([place])    a. 11/2014 PFT's: FEV1 0.87 (38%), FVC 1.41 (48%), FEF 25-75 0.41 (19%). Unable to perform DLCO; b. 12/2017 Simple spirometry ration 67%. FEV1 1.21 L+53% predicted. FVC 1.79L 61% predicted.  . Coronary artery calcification seen on CT scan    a. 03/2017 CT Chest.  . Depression   . Diabetic neuropathy ([place])   . Esophageal stricture   . Familial hematuria    . GERD (gastroesophageal reflux disease)   . Hyperlipidemia   . Hypertension   . IBS (irritable bowel syndrome)   . Nephrolithiasis   . NIDDM (non-insulin dependent diabetes mellitus)   . Obesity, unspecified   . Pulmonary hypertension ([place])    a. WHO Group 3; b. 10/2016 Echo: EF 55-60%, no rwma, Gr1 DD, mild to mod AS, mean grad 77mHg, mildly dil RV w/ mildly reduced RV fxn, mildly dil RA. PASP 1165mg; c. 12/2017 RHC: RA 11, RV 92/12, PA 92/33(57), PCWP 15, Fick CO/CI 5.9/3.3. PVR 7.2 WU. Ao 93%, PA 62/64%.  . Marland KitchenVD (peripheral vascular disease) ([place]      Review of Systems  Constitutional: Positive for fatigue. Negative for diaphoresis and fever.  HENT: Negative for postnasal drip, rhinorrhea and sinus pressure.   Respiratory: Negative for cough, shortness of breath and wheezing.   Cardiovascular: Negative for chest pain, palpitations and leg swelling.       Objective:   Physical Exam  Vitals:   05/12/18 1322  BP: 136/80  Pulse: 83  SpO2: 91%  Weight: 147 lb (66.7 kg)  Height: _0  (1.549 m)   5L [place] pulse  Gen: chronically ill appearing HENT: OP clear, TM's clear, neck supple PULM: Crackles bases B, normal percussion CV: RRR, systolic murmur noted, no edema GI: BS+, soft, nontender Derm: no cyanosis or rash Psyche: normal  mood and affect    CBC    Component Value Date/Time   WBC 16.1 (H) 04/24/2018 0441   RBC 5.47 (H) 04/24/2018 0441   HGB 10.7 (L) 04/24/2018 0441   HCT 36.7 04/24/2018 0441   PLT 184 04/24/2018 0441   MCV 67.2 (L) 04/24/2018 0441   MCH 19.5 (L) 04/24/2018 0441   MCHC 29.1 (L) 04/24/2018 0441   RDW 21.4 (H) 04/24/2018 0441   LYMPHSABS 1.3 04/19/2018 2214   MONOABS 1.1 (H) 04/19/2018 2214   EOSABS 0.0 04/19/2018 2214   BASOSABS 0.1 04/19/2018 2214   Echo: December 2018 echocardiogram showed a normal LVEF RV was mildly dilated with an RVSP greater than 100  RHC 12/05/2014 RA 12 mmHg (mean) with O2 sat 72%; RV 97/16 mmHg with O2 sat  73%; PA 97/30 mmHg with O2 sat 67%; PCWP(mean) 14 mmHg (mean); Cardiac Output 5.78 L/min Right heart catheterization March 2019: PA pressure 92/33, mean 57, pulmonary vascular resistance 7.2, cardiac output  5.9, pulmonary capillary wedge pressure 15.  Chest imaging: CT scan chest June 2018 images independently reviewed showing mild to moderate centrilobular emphysema in the upper lobes, some mosaicism in the bases. Feb 2016 V/Q IMPRESSION: No evidence of pulmonary embolism.   Pulmonary function testing: March 2019 simple spirometry ratio 67%, FEV1 1.21 L 53% predicted, FVC 1.79 L 61% predicted  Records reviewed from her recent visit with cardiology about 2 weeks ago where sildenafil was started.     Assessment & Plan:   Dyspnea, unspecified type  Chronic obstructive pulmonary disease, unspecified COPD type ([place])  Cor pulmonale, chronic (HCC)  Chronic respiratory failure with hypoxia ([place])  Discussion: La has done well with the addition of Tyvaso.  She is compliant with her Stiolto as well.  She has not had an exacerbation of her COPD since the last visit and she remains compliant with her medications including oxygen.  I think that it safe to continue titrating up the Tyvaso, though she does note some itching associated with it.  If this worsens then I will have her drop the dose down and slowly titrate back up.  Pulmonary hypertension: Continue titrating up Tyvaso as per direction from the home health nurse, I am okay with a goal of 9 puffs at a time 4 times a day  Chronic respiratory failure with hypoxemia: Continue 4 L continuously at home, 5 L pulse when outside the home  COPD: Continue taking  Follow-up in 4 to 6 weeks with a nurse practitioner or sooner if needed    Current Outpatient Medications:  .  albuterol (VENTOLIN HFA) 108 (90 Base) MCG/ACT inhaler, USE 2 PUFFS EVERY SIX HOURS AS NEEDED FOR WHEEZING OR SHORTNESS OF BREATH, Disp: 18 g, Rfl: 3 .  aspirin 325  MG tablet, Take 325 mg by mouth at bedtime. , Disp: , Rfl:  .  calcitRIOL (ROCALTROL) 0.5 MCG capsule, Take 0.5 mcg by mouth daily. , Disp: , Rfl:  .  cholecalciferol (VITAMIN D) 1000 units tablet, Take 1,000 Units by mouth at bedtime. , Disp: , Rfl:  .  ferrous sulfate 325 (65 FE) MG tablet, Take 1 tablet (325 mg total) by mouth 2 (two) times daily with a meal., Disp: 60 tablet, Rfl: 3 .  glipiZIDE (GLUCOTROL) 5 MG tablet, Take 5 mg by mouth See admin instructions. Take 1 tablet is blood sugar is over 160., Disp: , Rfl:  .  insulin glargine (LANTUS) 100 UNIT/ML injection, Inject 20 Units into the skin daily., Disp: ,  Rfl:  .  lovastatin (MEVACOR) 40 MG tablet, Take 2 tablets (80 mg total) by mouth at bedtime., Disp: 180 tablet, Rfl: 3 .  methadone (DOLOPHINE) 10 MG tablet, Take 2 tablets (20 mg total) by mouth 4 (four) times daily., Disp: 240 tablet, Rfl: 0 .  metoprolol tartrate (LOPRESSOR) 25 MG tablet, Take 1/2 tablet by mouth twice a day, Disp: 90 tablet, Rfl: 3 .  mirtazapine (REMERON) 30 MG tablet, Take 15 mg by mouth at bedtime., Disp: , Rfl:  .  nitroGLYCERIN (NITROSTAT) 0.4 MG SL tablet, Place 1 tablet (0.4 mg total) under the tongue every 5 (five) minutes as needed for chest pain., Disp: 100 tablet, Rfl: 1 .  ondansetron (ZOFRAN ODT) 4 MG disintegrating tablet, Take 1 tablet (4 mg total) by mouth every 8 (eight) hours as needed for nausea or vomiting., Disp: 20 tablet, Rfl: 0 .  pantoprazole (PROTONIX) 40 MG tablet, Take 1 tablet by mouth twice a day, Disp: 180 tablet, Rfl: 3 .  Tiotropium Bromide-Olodaterol (STIOLTO RESPIMAT) 2.5-2.5 MCG/ACT AERS, Inhale 2 puffs into the lungs daily., Disp: 4 g, Rfl: 5 .  torsemide (DEMADEX) 20 MG tablet, Take 2 tablets (40 mg total) by mouth daily., Disp: 180 tablet, Rfl: 3 .  Treprostinil (TYVASO) 0.6 MG/ML SOLN, Inhale 18 mcg into the lungs 4 (four) times daily., Disp: , Rfl:

## 2018-05-12 NOTE — Patient Instructions (Signed)
Pulmonary hypertension: Continue taking sildenafil as you are doing Continue titrating up Tyvaso as per direction from the home health nurse, I am okay with a goal of 9 puffs at a time 4 times a day  Chronic respiratory failure with hypoxemia: Continue 4 L continuously at home, 5 L pulse when outside the home  COPD: Continue taking  Follow-up in 4 to 6 weeks with a nurse practitioner or sooner if needed

## 2018-05-13 ENCOUNTER — Ambulatory Visit (INDEPENDENT_AMBULATORY_CARE_PROVIDER_SITE_OTHER): Payer: PPO | Admitting: Internal Medicine

## 2018-05-13 ENCOUNTER — Encounter: Payer: Self-pay | Admitting: Internal Medicine

## 2018-05-13 VITALS — BP 96/62 | HR 87 | Temp 98.0°F | Ht 61.0 in | Wt 148.0 lb

## 2018-05-13 DIAGNOSIS — B373 Candidiasis of vulva and vagina: Secondary | ICD-10-CM | POA: Diagnosis not present

## 2018-05-13 DIAGNOSIS — E1151 Type 2 diabetes mellitus with diabetic peripheral angiopathy without gangrene: Secondary | ICD-10-CM

## 2018-05-13 DIAGNOSIS — IMO0002 Reserved for concepts with insufficient information to code with codable children: Secondary | ICD-10-CM

## 2018-05-13 DIAGNOSIS — E1165 Type 2 diabetes mellitus with hyperglycemia: Secondary | ICD-10-CM | POA: Diagnosis not present

## 2018-05-13 DIAGNOSIS — N184 Chronic kidney disease, stage 4 (severe): Secondary | ICD-10-CM

## 2018-05-13 DIAGNOSIS — I272 Pulmonary hypertension, unspecified: Secondary | ICD-10-CM

## 2018-05-13 DIAGNOSIS — B3731 Acute candidiasis of vulva and vagina: Secondary | ICD-10-CM | POA: Insufficient documentation

## 2018-05-13 DIAGNOSIS — I2781 Cor pulmonale (chronic): Secondary | ICD-10-CM | POA: Diagnosis not present

## 2018-05-13 MED ORDER — FLUCONAZOLE 150 MG PO TABS: 150.0000 mg | ORAL_TABLET | Freq: Once | ORAL | 1 refills | Status: AC

## 2018-05-13 NOTE — Assessment & Plan Note (Signed)
Symptomatically better on the new pulmonary bronchodilator No apparent side effects

## 2018-05-13 NOTE — Assessment & Plan Note (Signed)
No hypoglycemia now Will recheck A1c at follow up

## 2018-05-13 NOTE — Assessment & Plan Note (Signed)
Apparent cause of shock liver Off the sildenafil---liver functions now ~normal

## 2018-05-13 NOTE — Assessment & Plan Note (Signed)
Classic symptoms after antibiotic use Will try single dose fluconazole

## 2018-05-13 NOTE — Assessment & Plan Note (Signed)
Creatinine also improved with recent blood work--- improvement in right heart failure

## 2018-05-13 NOTE — Progress Notes (Signed)
Subjective:    Patient ID: Lindsay Harmon, female    DOB: 05-14-1956, 62 y.o.   MRN: [id number]  HPI Here for follow up of severe medical conditions  Doing well on the tyvaso "It just feels lighter in there"---breathing is easier sats are better DOE has improved  No chest pain No palpitations No dizziness  Sugars 130-197 mostly (rare over 200) No more hypoglycemia since cutting the lantus to 20 units  Current Outpatient Medications on File Prior to Visit  Medication Sig Dispense Refill  . albuterol (VENTOLIN HFA) 108 (90 Base) MCG/ACT inhaler USE 2 PUFFS EVERY SIX HOURS AS NEEDED FOR WHEEZING OR SHORTNESS OF BREATH 18 g 3  . aspirin 325 MG tablet Take 325 mg by mouth at bedtime.     . calcitRIOL (ROCALTROL) 0.5 MCG capsule Take 0.5 mcg by mouth daily.     . cholecalciferol (VITAMIN D) 1000 units tablet Take 1,000 Units by mouth at bedtime.     . ferrous sulfate 325 (65 FE) MG tablet Take 1 tablet (325 mg total) by mouth 2 (two) times daily with a meal. 60 tablet 3  . glipiZIDE (GLUCOTROL) 5 MG tablet Take 5 mg by mouth See admin instructions. Take 1 tablet is blood sugar is over 160.    Marland Kitchen insulin glargine (LANTUS) 100 UNIT/ML injection Inject 20 Units into the skin daily.    Marland Kitchen lovastatin (MEVACOR) 40 MG tablet Take 2 tablets (80 mg total) by mouth at bedtime. 180 tablet 3  . methadone (DOLOPHINE) 10 MG tablet Take 2 tablets (20 mg total) by mouth 4 (four) times daily. 240 tablet 0  . metoprolol tartrate (LOPRESSOR) 25 MG tablet Take 1/2 tablet by mouth twice a day 90 tablet 3  . mirtazapine (REMERON) 30 MG tablet Take 15 mg by mouth at bedtime.    . nitroGLYCERIN (NITROSTAT) 0.4 MG SL tablet Place 1 tablet (0.4 mg total) under the tongue every 5 (five) minutes as needed for chest pain. 100 tablet 1  . ondansetron (ZOFRAN ODT) 4 MG disintegrating tablet Take 1 tablet (4 mg total) by mouth every 8 (eight) hours as needed for nausea or vomiting. 20 tablet 0  . pantoprazole (PROTONIX)  40 MG tablet Take 1 tablet by mouth twice a day 180 tablet 3  . Tiotropium Bromide-Olodaterol (STIOLTO RESPIMAT) 2.5-2.5 MCG/ACT AERS Inhale 2 puffs into the lungs daily. 4 g 5  . torsemide (DEMADEX) 20 MG tablet Take 2 tablets (40 mg total) by mouth daily. 180 tablet 3  . Treprostinil (TYVASO) 0.6 MG/ML SOLN Inhale 18 mcg into the lungs 4 (four) times daily.     No current facility-administered medications on file prior to visit.     Allergies  Allergen Reactions  . Sertraline Hcl Other (See Comments)    Cloudy thoughts, malaise    Past Medical History:  Diagnosis Date  . Allergy   . Aortic atherosclerosis ([place])   . Arthritis   . CKD (chronic kidney disease), stage IV ([place])   . COPD (chronic obstructive pulmonary disease) ([place])    a. 11/2014 PFT's: FEV1 0.87 (38%), FVC 1.41 (48%), FEF 25-75 0.41 (19%). Unable to perform DLCO; b. 12/2017 Simple spirometry ration 67%. FEV1 1.21 L+53% predicted. FVC 1.79L 61% predicted.  . Coronary artery calcification seen on CT scan    a. 03/2017 CT Chest.  . Depression   . Diabetic neuropathy ([place])   . Esophageal stricture   . Familial hematuria   . GERD (gastroesophageal reflux disease)   .  Hyperlipidemia   . Hypertension   . IBS (irritable bowel syndrome)   . Nephrolithiasis   . NIDDM (non-insulin dependent diabetes mellitus)   . Obesity, unspecified   . Pulmonary hypertension ([place])    a. WHO Group 3; b. 10/2016 Echo: EF 55-60%, no rwma, Gr1 DD, mild to mod AS, mean grad 44mHg, mildly dil RV w/ mildly reduced RV fxn, mildly dil RA. PASP 1166mg; c. 12/2017 RHC: RA 11, RV 92/12, PA 92/33(57), PCWP 15, Fick CO/CI 5.9/3.3. PVR 7.2 WU. Ao 93%, PA 62/64%.  . Marland KitchenVD (peripheral vascular disease) ([place]    Past Surgical History:  Procedure Laterality Date  . ABDOMINAL HYSTERECTOMY    . CARPAL TUNNEL RELEASE    . CESAREAN SECTION    . ERCP N/A 03/17/2017   Procedure: ENDOSCOPIC RETROGRADE CHOLANGIOPANCREATOGRAPHY (ERCP);  Surgeon: WoLucilla LameMD;   [facility];  Service: Endoscopy;  Laterality: N/A;  . EUS N/A 03/13/2017   Procedure: FULL UPPER ENDOSCOPIC ULTRASOUND (EUS) RADIAL;  Surgeon: Burbridge, ReMurray HodgkinsMD;  Location: [facility]OSCOPY;  Service: Endoscopy;  Laterality: N/A;  . RIGHT HEART CATH N/A 01/22/2018   Procedure: RIGHT HEART CATH;  Surgeon: BeJolaine ArtistMD;  [facility];  Service: Cardiovascular;  Laterality: N/A;  . RIGHT HEART CATHETERIZATION N/A 12/05/2014   Procedure: RIGHT HEART CATH;  Surgeon: HeSinclair GroomsMD;  [facility];  Service: Cardiovascular;  Laterality: N/A;    Family History  Problem Relation Age of Onset  . Diabetes Mother   . Cancer Mother        ovarian,melanoma  . Allergies Father   . Cancer Father        lung  . Cancer Maternal Grandmother        uterine  . Emphysema Paternal Grandfather   . Allergies Sister   . Breast cancer Neg Hx     Social History   Socioeconomic History  . Marital status: Legally Separated    Spouse name: Not on file  . Number of children: 1  . Years of education: Not on file  . Highest education level: Not on file  Occupational History  . 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 Financial resource strain: Not on file  . Food insecurity:    Worry: Not on file    Inability: Not on file  . Transportation needs:    Medical: Not on file    Non-medical: Not on file  Tobacco Use  . Smoking status: Former Smoker    Packs/day: 1.50    Years: 20.00    Pack years: 30.00    Types: Cigarettes    Last attempt to quit: 01/04/2009    Years since quitting: 9.3  . Smokeless tobacco: Never Used  Substance and Sexual Activity  . Alcohol use: No    Alcohol/week: 0.0 oz    Comment: heavy in the past  . Drug use: No  . Sexual activity: Never  Lifestyle  . Physical activity:    Days per week: Not on file    Minutes per session: Not on file  . Stress: Not on file    Relationships  . Social connections:    Talks on phone: Not on file    Gets together: Not on file    Attends religious service: Not on file    Active member of club or organization: Not on file    Attends meetings  of clubs or organizations: Not on file    Relationship status: Not on file  . Intimate partner violence:    Fear of current or ex partner: Not on file    Emotionally abused: Not on file    Physically abused: Not on file    Forced sexual activity: Not on file  Other Topics Concern  . Not on file  Social History Narrative   No living will   Son [name] (then mom) should make decisions for her if she is unable.    Would accept resuscitation attempts but no prolonged ventilation   Not sure about tube feeds but probably wouldn't want them if cognitively unaware   Review of Systems Sleeps fair--not great. Chair or cough but pretty flat. No PND Appetite is good    Objective:   Physical Exam  Constitutional: She appears well-developed. No distress.  Cardiovascular: Normal rate and regular rhythm. Exam reveals no gallop.  No murmur heard. Same systolic murmur  Respiratory: Effort normal. No respiratory distress. She has no wheezes. She has no rales.  Decreased breath sounds but clear No dullness  Musculoskeletal: She exhibits no edema.  Psychiatric: She has a normal mood and affect. Her behavior is normal.           Assessment & Plan:

## 2018-05-14 ENCOUNTER — Encounter: Payer: Self-pay | Admitting: *Deleted

## 2018-05-14 ENCOUNTER — Other Ambulatory Visit: Payer: Self-pay | Admitting: *Deleted

## 2018-05-14 NOTE — Patient Outreach (Signed)
[facility]) Care Management  [place] hospital discharge day 20 05/14/2018  REGIS WILAND 1955/11/06 [id number]  ANJULIE DIPIERRO is a 62 y.o. female referred to [facility] after recent hospitalization June 23-28, 2019 for hypoxic respiratory failure secondary to severe pulmonary HTN;Patient has history including, but not limited to, dCHF; COPD, on home O2 at 4-5 L/min via [place]; DM- Type II; CKD- stage IV; pulmonary HTN; HTN; PVD; and GERD.HIPAA/ identity verified in person with patient during home visit today, and patient's mother is present for home visit and actively participates in all aspects of visit today.  [facility] services were again discussed with patient today, and she provides verbal consent for [facility] CCM involvement in her care; [facility] CM updated written consent also obtained; [facility] CM welcome packet provided to patient.  Pleasant 90 minute home visit   Today, patient states that she "is doing great with no complaints;" reports "usual" back pain, states takes prescribed medication, "which helps."  Patient is temporarily still staying at her mother's home, which is right next door to her own home; she is no obvious/ apparent distress throughout entirety of today's home visit.  Subjective:  "I am feeling so much better- I think I have turned the corner; I think I had a set-back with some of the medication I was previously on, because now that that medicine has been stopped- I am back to my normal self."  Assessment:  Malyssa appears to be recuperating well after her recent hospitalization; she denies community resource needs and has a supportive family network that assists in her care as indicated.  Ashtin is adherent to her overall plan of care, medications, and attending scheduled provider appointments.  Christabell has a very good baseline knowledge of self-health management strategies for CHF, but could benefit from ongoing  reinforcement of same.  Roselynne is considering her wishes around advanced Directive planning.  Patient further reports:  -- no concerns/ issues/ or questions around her currently prescribed medications; verbalizes a very good general understanding of the purpose, dosing, and scheduling of medications.  Denies issues with swallowing medications; independently prepares and manages medications, using weekly pill planner box.  Patient was recently discharged from hospital and all medications were thoroughly reviewed with her today in person.  -- [facility]) visits for RN and PT through [facility] continue, "going great;" reports RN visited earlier this week, "on Monday;" patient expects [facility] PT visit this afternoon; confirms that she has phone numbers for [facility] team and endorses active participation in all disciplines.  Positive reinforcement provided.  -- Provider appointments:  Attended PCP office visit yesterday, pulmonary provider office visit Tuesday 7/16; states "visits went well," reports "no changes made" post office visits.  Reports that she has recently started transporting herself/ driving self to provider appointments, as she believes she "is doing so well" she is able to drive without needing assistance.  Upcoming provider appointments reviewed with patient-- renal provider office visit scheduled for tomorrow 05/15/18.   -- Safety/ Mobility/ Falls:  Patient denies new/ recent falls post-hospital discharge; states she has progressed in mobility post-hospital discharge and is not having to use any assistive devices for ambulation around home; uses cane/ walker when she goes outside of home.  In-home fall evaluation completed; patient has a steady purposeful gait with ambulation without use of assistive devices.  No obvious fall risks/ hazards noted in patient's home environment.  General fall risk/ prevention education reiterated with  patient today.  -- Advanced Directive Planning: endorses that she was  provided AD planning packet at time of hospital visit; states "still considering" her wishes; discussed AD planning with patient thoroughly, provided additional planning packet for "practice" and booklet "Hard Choices for Loving people."  Using teach back method, AD planning basics discussed thoroughly with patient today.  Self-health management ofchronic disease state of CHF: -- confirms that she has continued monitoring/ recording daily weights as a part of her daily routine, as above; again verbalizes accurate weight gain guidelines in setting of CHF to report weight gain > 3 lbs overnight/ 5 lbs in one week to care provider-- patient noted to be very consistent and organized in her recording of daily weights- positive reinforcement provided -- home recorded daily weights reviewed with patient today:  Weights have been consistently between 147-150 lbs post- last heart failure clinic provider office visit on April 27, 2018; weight today:  147.6 lbs -- continues to follow "heart healthy" diet, adheres to sodium/ fluid restrictions -- uses home O2 at 4-5 L/min via [place]/ oxygenator- reports recently serviced by [facility]; has portable light weight O2 tank "that makes it's own oxygen" for transporting when she goes out  -- denies shortness of breath post-hospital discharge -- is familiar with CHF zones from previously provided education:  Reiterated with patient today and "Living Better with Heart failure" booklet re-provided to patient; using teach back method, thoroughly reviewed CHF zones and corresponding action plans with focus on yellow zone.  Patient denies further issues, concerns, or problems today. Iconfirmed that patient hasmy direct phone number, the main [facility] CM office phone number, and the [facility] CM 24-hour nurse advice phone number should issues arise prior to next scheduled [place] phone call in 2 weeks.  Encouraged patient to contact me directly or [facility] CM office if  needs, questions, issues, or concerns arise prior to next scheduled outreach; patient agreed to do so.  Objective:   BP 128/78   Pulse 80   Resp 16   Wt 147 lb 9.6 oz (67 kg)   SpO2 95%   BMI 27.89 kg/m    Review of Systems  Constitutional: Negative.   Respiratory: Negative.  Negative for cough, shortness of breath and wheezing.   Cardiovascular: Negative.  Negative for chest pain and leg swelling.  Gastrointestinal: Negative.  Negative for abdominal pain and nausea.  Genitourinary: Positive for frequency and urgency.       Diuretic therapy  Musculoskeletal: Positive for back pain. Negative for falls.       Chronic back pain- unchanged from baseline per patient report today  Neurological: Negative for dizziness.  Psychiatric/Behavioral: Negative for depression. The patient is not nervous/anxious.    Physical Exam  Constitutional: She is oriented to person, place, and time. She appears well-developed and well-nourished. No distress.  Cardiovascular: Normal rate, regular rhythm and intact distal pulses.  Murmur heard. Pulses:      Radial pulses are 2+ on the right side, and 2+ on the left side.  Unable to palpate DP's bilaterally-- feet cool to touch- patient reports as baseline  Respiratory: Effort normal and breath sounds normal. No respiratory distress. She has no wheezes. She has no rales.  Wears O2 at home "all the time" at 4 L/min via oxygenator; reports "just checked" by [facility] home health team; also portable light-weight O2 pack "that makes it's own O2"  GI: Soft. Bowel sounds are normal.  Musculoskeletal: She exhibits no edema.  Neurological:  She is alert and oriented to person, place, and time.  Skin: Skin is warm and dry. No erythema.  Psychiatric: She has a normal mood and affect. Her behavior is normal. Judgment and thought content normal.   Encounter Medications:   Outpatient Encounter Medications as of 05/14/2018  Medication Sig Note  . albuterol (VENTOLIN HFA)  108 (90 Base) MCG/ACT inhaler USE 2 PUFFS EVERY SIX HOURS AS NEEDED FOR WHEEZING OR SHORTNESS OF BREATH 05/14/2018: Has not needed recently  . aspirin 325 MG tablet Take 325 mg by mouth at bedtime.    . calcitRIOL (ROCALTROL) 0.5 MCG capsule Take 0.5 mcg by mouth daily.    . cholecalciferol (VITAMIN D) 1000 units tablet Take 1,000 Units by mouth at bedtime.    . ferrous sulfate 325 (65 FE) MG tablet Take 1 tablet (325 mg total) by mouth 2 (two) times daily with a meal.   . glipiZIDE (GLUCOTROL) 5 MG tablet Take 5 mg by mouth See admin instructions. Take 1 tablet is blood sugar is over 160.   Marland Kitchen insulin glargine (LANTUS) 100 UNIT/ML injection Inject 20 Units into the skin daily.   Marland Kitchen loratadine (CLARITIN) 10 MG tablet Take 10 mg by mouth 2 (two) times daily.   Marland Kitchen lovastatin (MEVACOR) 40 MG tablet Take 2 tablets (80 mg total) by mouth at bedtime.   . methadone (DOLOPHINE) 10 MG tablet Take 2 tablets (20 mg total) by mouth 4 (four) times daily.   . metoprolol tartrate (LOPRESSOR) 25 MG tablet Take 1/2 tablet by mouth twice a day   . mirtazapine (REMERON) 30 MG tablet Take 15 mg by mouth at bedtime.   . nitroGLYCERIN (NITROSTAT) 0.4 MG SL tablet Place 1 tablet (0.4 mg total) under the tongue every 5 (five) minutes as needed for chest pain.   Marland Kitchen ondansetron (ZOFRAN ODT) 4 MG disintegrating tablet Take 1 tablet (4 mg total) by mouth every 8 (eight) hours as needed for nausea or vomiting. 05/14/2018: Has not needed recently  . pantoprazole (PROTONIX) 40 MG tablet Take 1 tablet by mouth twice a day   . Tiotropium Bromide-Olodaterol (STIOLTO RESPIMAT) 2.5-2.5 MCG/ACT AERS Inhale 2 puffs into the lungs daily.   Marland Kitchen torsemide (DEMADEX) 20 MG tablet Take 2 tablets (40 mg total) by mouth daily.   . Treprostinil (TYVASO) 0.6 MG/ML SOLN Inhale 18 mcg into the lungs 4 (four) times daily.    No facility-administered encounter medications on file as of 05/14/2018.    Functional Status:   In your present state of health,  do you have any difficulty performing the following activities: 05/05/2018 04/27/2018  Hearing? N -  Vision? N -  Difficulty concentrating or making decisions? - N  Walking or climbing stairs? - Y  Comment - -  Dressing or bathing? - N  Doing errands, shopping? - Y  Conservation officer, nature and eating ? - N  Using the Toilet? - N  In the past six months, have you accidently leaked urine? - N  Do you have problems with loss of bowel control? - N  Managing your Medications? - N  Managing your Finances? - N  Housekeeping or managing your Housekeeping? - Y  Comment - Family assists as indicated  Some recent data might be hidden   Fall/Depression Screening:    Fall Risk  05/05/2018 04/27/2018 04/21/2017  Falls in the past year? (No Data) No No  Comment Denies new/ recent falls - -  Risk for fall due to : - Impaired balance/gait;Medication side  effect;Other (Comment) -  Risk for fall due to: Comment - "weak" after hospital visit -   [facility] 2/9 Scores 05/14/2018 04/21/2017 02/27/2016 02/27/2016 05/10/2015 05/10/2015 04/11/2015  PHQ - 2 Score 0 - 0 0 _0 PHQ- 9 Score - - - - - 3 4  Exception Documentation - Medical reason - - - - -   Plan:  Patient will take medications as prescribed and will attend all scheduled provider appointments  Patient will promptly notify care providers for any new concerns/ issues/ problems that arise  Patient will continue actively participate in home health services as ordered post-hospital discharge  Patient will continue monitoring/ recording daily weights and following weight gain guidelines in setting of CHF and will review CHF zones with focus on yellow zone  Patient will review educational material provided to her today around Advanced Directive Planning for ongoing discussion as indicated  I will share today's [facility] CM initial home visit notes and care plan with patient's PCP  [facility] CM outreach to continue with scheduled phone call in 2 weeks  [facility] CM  Care Plan Problem One     Most Recent Value  Care Plan Problem One  High Risk for hospital readmission related to/ as evidenced by recent hospitalization for ARF/ pulmonary HTN/ CHF exacerbation  Role Documenting the Problem One  Care Management Coordinator  Care Plan for Problem One  Active  [facility] Long Term Goal   Over the next 31 days, patient will not experience hospital re-admission, as evidenced by patient reporting and review of EMR during [place] outreach  [facility] Long Term Goal Start Date  04/27/18  Interventions for Problem One Long Term Goal  [facility] RN CM initial home visit completed with physical assessment, fall evaluation, and in-home medication review completed,  reviewed recently attended and upcoming provider appointments with patient and confirmed that she has transportation to all and plans to attend as scheduled  [facility] CM Short Term Goal #2   Over the next 30 days, patient will actively participate in home health services as ordered post- hospital discharge, as evidenced by patient reporting and collaboration with home health team as indicated  [facility] CM Short Term Goal #2 Start Date  04/27/18  Interventions for Short Term Goal #2  Discussed patient's participation in home health services and encouraged patient to continue participating as prdered post-hospital discharge,  reviewed with patient recent/ upcoming home health team visits    [facility] CM Care Plan Problem Two     Most Recent Value  Care Plan Problem Two  Need for reinforcement of self-health management strategies for chronic disease state of CHF, as evidenced by patient reporting  Role Documenting the Problem Two  Care Management Coordinator  Care Plan for Problem Two  Active  Interventions for Problem Two Long Term Goal   Confirmed that patient is able to verbalize rationale for daily weight monitoring in setting of CHF and that she has continued monitoring and recording daily weights at home post-most recent hospital  discharge,  using teachback method, provided and reiterated printed educational material on rationale for/ value of daily weight monitoring,  reiterated weight gain guidelines in setting of CHF with action plan for weight gain ,  Reviewed all recently recorded weights obtained at home with patient  [facility] Long Term Goal  Over the next 60 days, patient will continue monitoring and recording daily weights, as evidenced by patient reporting and review of same during [facility] RN CCM outreach  [facility] Long Term Goal Start Date  04/27/18  [facility] CM Short Term Goal #1   Over the next 30 days, patient will re-review CHF zones and will be able to verbalize signs/ symptoms/ corresponding action plan for yellow CHF zone, as evidenced by patient reporting during [facility] RN CCm outreach  [facility] CM Short Term Goal #1 Start Date  05/14/18  Interventions for Short Term Goal #2   Using teachback method, provided and reviewed with patient printed education around signs/ symptoms/ corresponding action plan for CHF zones,  provided Living better with heart failure booklet and encouraged patient to re-review this post-most recent hospital discharge    [facility] CM Care Plan Problem Three     Most Recent Value  Care Plan Problem Three  Need for creation of Advanced Directives, as evidenced by patient reporting   Role Documenting the Problem Three  Care Management [place] for Problem Three  Active  [facility] Long Term Goal   Over the next 60 days, patient will review Advanced Directive packet and discuss questions with [facility] RN CCM, as evidenced by patient reporting during [facility] RN CCM outreach  [facility] Long Term Goal Start Date  05/14/18  Interventions for Problem Three Long Term Goal  Using teachback method, provided and thoroughly discussed basics of AD planning with patient,  discussed her current thoughts/ wishes for care, and provided "hard choices for loving people" booklet      Oneta Rack, RN, BSN, [place]  Coordinator [facility] Care Management  [phone]

## 2018-05-15 DIAGNOSIS — N2581 Secondary hyperparathyroidism of renal origin: Secondary | ICD-10-CM | POA: Diagnosis not present

## 2018-05-15 DIAGNOSIS — D631 Anemia in chronic kidney disease: Secondary | ICD-10-CM | POA: Diagnosis not present

## 2018-05-15 DIAGNOSIS — E871 Hypo-osmolality and hyponatremia: Secondary | ICD-10-CM | POA: Diagnosis not present

## 2018-05-15 DIAGNOSIS — N184 Chronic kidney disease, stage 4 (severe): Secondary | ICD-10-CM | POA: Diagnosis not present

## 2018-05-15 DIAGNOSIS — R809 Proteinuria, unspecified: Secondary | ICD-10-CM | POA: Diagnosis not present

## 2018-05-18 DIAGNOSIS — I130 Hypertensive heart and chronic kidney disease with heart failure and stage 1 through stage 4 chronic kidney disease, or unspecified chronic kidney disease: Secondary | ICD-10-CM | POA: Diagnosis not present

## 2018-05-18 DIAGNOSIS — N184 Chronic kidney disease, stage 4 (severe): Secondary | ICD-10-CM | POA: Diagnosis not present

## 2018-05-18 DIAGNOSIS — J441 Chronic obstructive pulmonary disease with (acute) exacerbation: Secondary | ICD-10-CM | POA: Diagnosis not present

## 2018-05-18 DIAGNOSIS — E1122 Type 2 diabetes mellitus with diabetic chronic kidney disease: Secondary | ICD-10-CM | POA: Diagnosis not present

## 2018-05-18 DIAGNOSIS — I50813 Acute on chronic right heart failure: Secondary | ICD-10-CM | POA: Diagnosis not present

## 2018-05-21 ENCOUNTER — Other Ambulatory Visit (HOSPITAL_COMMUNITY): Payer: Self-pay | Admitting: Internal Medicine

## 2018-05-22 ENCOUNTER — Other Ambulatory Visit: Payer: Self-pay | Admitting: Internal Medicine

## 2018-05-22 MED ORDER — NITROGLYCERIN 0.4 MG SL SUBL: 0.4000 mg | SUBLINGUAL_TABLET | SUBLINGUAL | 1 refills | Status: DC | PRN

## 2018-05-22 MED ORDER — METHADONE HCL 10 MG PO TABS
20.0000 mg | ORAL_TABLET | Freq: Four times a day (QID) | ORAL | 0 refills | Status: DC
Start: 2018-05-22 — End: 2018-05-29

## 2018-05-22 NOTE — Telephone Encounter (Signed)
Copied from [place] [phone]. Topic: Quick Communication - Rx Refill/Question >> May 22, 2018 10:23 AM Burchel, Abbi R wrote: Medication: methadone (DOLOPHINE) 10 MG tablet, nitroGLYCERIN (NITROSTAT) 0.4 MG SL tablet  Preferred Pharmacy: [place], [place] - [address]  [phone] (Phone) [phone] (Fax)    Pt was advised that RX refills may take up to 3 business days.

## 2018-05-22 NOTE — Telephone Encounter (Signed)
Refill of methadone LRF 04/17/18  #240  0 refills  Nitroglycerin LRF 07/23/16  #100  1 refill  LOV 05/13/18 [place], [place] - [address]  [phone] (Phone) [phone] (Fax)

## 2018-05-25 DIAGNOSIS — Z87891 Personal history of nicotine dependence: Secondary | ICD-10-CM

## 2018-05-25 DIAGNOSIS — F329 Major depressive disorder, single episode, unspecified: Secondary | ICD-10-CM

## 2018-05-25 DIAGNOSIS — I130 Hypertensive heart and chronic kidney disease with heart failure and stage 1 through stage 4 chronic kidney disease, or unspecified chronic kidney disease: Secondary | ICD-10-CM | POA: Diagnosis not present

## 2018-05-25 DIAGNOSIS — Z9181 History of falling: Secondary | ICD-10-CM

## 2018-05-25 DIAGNOSIS — E1140 Type 2 diabetes mellitus with diabetic neuropathy, unspecified: Secondary | ICD-10-CM | POA: Diagnosis not present

## 2018-05-25 DIAGNOSIS — E1151 Type 2 diabetes mellitus with diabetic peripheral angiopathy without gangrene: Secondary | ICD-10-CM | POA: Diagnosis not present

## 2018-05-25 DIAGNOSIS — D509 Iron deficiency anemia, unspecified: Secondary | ICD-10-CM

## 2018-05-25 DIAGNOSIS — Z8701 Personal history of pneumonia (recurrent): Secondary | ICD-10-CM

## 2018-05-25 DIAGNOSIS — M1991 Primary osteoarthritis, unspecified site: Secondary | ICD-10-CM

## 2018-05-25 DIAGNOSIS — J441 Chronic obstructive pulmonary disease with (acute) exacerbation: Secondary | ICD-10-CM | POA: Diagnosis not present

## 2018-05-25 DIAGNOSIS — N184 Chronic kidney disease, stage 4 (severe): Secondary | ICD-10-CM | POA: Diagnosis not present

## 2018-05-25 DIAGNOSIS — E1122 Type 2 diabetes mellitus with diabetic chronic kidney disease: Secondary | ICD-10-CM | POA: Diagnosis not present

## 2018-05-25 DIAGNOSIS — Z7982 Long term (current) use of aspirin: Secondary | ICD-10-CM

## 2018-05-25 DIAGNOSIS — Z9981 Dependence on supplemental oxygen: Secondary | ICD-10-CM

## 2018-05-25 DIAGNOSIS — I50813 Acute on chronic right heart failure: Secondary | ICD-10-CM | POA: Diagnosis not present

## 2018-05-25 DIAGNOSIS — I272 Pulmonary hypertension, unspecified: Secondary | ICD-10-CM | POA: Diagnosis not present

## 2018-05-25 DIAGNOSIS — I2510 Atherosclerotic heart disease of native coronary artery without angina pectoris: Secondary | ICD-10-CM

## 2018-05-25 DIAGNOSIS — K589 Irritable bowel syndrome without diarrhea: Secondary | ICD-10-CM

## 2018-05-25 DIAGNOSIS — I700 Atherosclerosis of aorta: Secondary | ICD-10-CM

## 2018-05-28 ENCOUNTER — Other Ambulatory Visit: Payer: Self-pay | Admitting: *Deleted

## 2018-05-28 ENCOUNTER — Encounter: Payer: Self-pay | Admitting: *Deleted

## 2018-05-28 NOTE — Patient Outreach (Signed)
[facility] [facility]) Care Management [place] Telephone Outreach Post-hospital discharge day 33  05/28/2018  CAIRA POCHE [birth year] [phone]  Successful telephone outreach to [name], 62 y.o. female referred to [place] after recent hospitalization June 23-28, 2019 for hypoxic respiratory failure secondary to severe pulmonary HTN;Patient has history including, but not limited to, dCHF; COPD, on home O2 at 4-5 L/min via [place]; DM- Type II; CKD- stage IV; pulmonary HTN; HTN; PVD; and GERD.HIPAA/ identity verified with patient today.    Today, patient states that she "is doingjust fine," and she denies pain/ new/ recent falls, problems/ concerns.  Patient reports that she has continued staying at her mother's home post-hospital discharge, but adds that she is driving herself to provider appointments and feels that she has "continued improving every day, and is not even having to use cane or walker at all."  Patient sounds to be in no obvious distress throughout entirety of phone call today.  Patient further reports:  -- no concerns/ issues/ or questions around her currently prescribed medications; denies recent changes to medications   -- Home health [facility]) visits for RN and PT through [facility] have now ended; patient reports that she felt she 'was ready for discharge" from [facility] services  --Provider appointments: Attended recent renal provider office visit 05/15/18- reports "got a really good report, labs looked better," and states she was told that her kidney function has "improved" since recent hospital visit/ discharge.  Upcoming provider appointments reviewed with patient-- patient verbalizes plans to attend upcoming scheduled pulmonology provider office visit on 06/19/18 and states she will drive herself to appointment.   -- Advanced Directive Planning: states that she has not yet had time to review previously provided AD planning packet/ educational  material; states she hopes to do soon, and denies questions around AD planning today; patient was again encouraged to review this material promptly.  Self-health management ofchronic disease state of CHF: -- confirms that shehas continuedmonitoring/ recordingdaily weights as a part of her daily routine, able toverbalizeaccuratelyweight gain guidelines in setting of CHF to report weight gain > 3 lbs overnight/ 5 lbs in one week to care provider -- home recorded daily weights reviewed with patient today:  Weights reported consistently between 147-150 lbs, with stated weight today of "148 pounds."  Denies fluctuations in weight ranges since last [facility] RN CCM outreach -- continues using home O2 at 4-5 L/min via [place]/ oxygenator, without need to increase O2 -- denies recent episodes shortness of breath/ dyspnea  -- states she is in "green" CHF zone today, "has been for quite awhile."  Patient is able to verbalize several items that would indicate that she is going into yellow zone, along with appropriate action plan; this was reiterated with patient using teachback method method; we also discussed signs/ symptoms red zone with corresponding action plan  Patient denies further issues, concerns, or problems today. Iconfirmed that patient hasmy direct phone number, the main [facility] CM office phone number, and the [facility] CM 24-hour nurse advice phone number should issues arise prior to next scheduled [place] phone call in 2 weeks.  Discussed with patient that she is making good progress in meeting her previously established [place] CM goals, and introduced possibility of [place] case closure with transfer to [place] in the future, as patient stated that she believes she was doing "so well" that she did not need additional follow up.  Discussed with patient that  I would contact her again with follow call next month pot-pulmonology provider office visit, and she is  agreeable to ongoing monthly [facility] CCM follow up by phone, and stated that she would "think about" transfer to [place] once [facility] RN CCM goals have been met.  Encouraged patient to contact me directly or [facility] CM office if needs, questions, issues, or concerns arise prior to next scheduled outreach; patient agreed to do so.  Plan:  Patient will take medications as prescribed and will attend all scheduled provider appointments  Patient will promptly notify care providers for any new concerns/ issues/ problems that arise  Patient will continue monitoring/ recording daily weightsand following weight gain guidelines in setting of CHF and will continue reviewing CHF zones with focus on yellow zone  Patient will review educational material previously provided to her around Advanced Directive Planning for ongoing discussion as indicated  [place] CM outreach to continue with scheduledphone call next month post- scheduled pulmonology provider visit  [facility] CM Care Plan Problem One     Most Recent Value  Care Plan Problem One  High Risk for hospital readmission related to/ as evidenced by recent hospitalization for ARF/ pulmonary HTN/ CHF exacerbation  Role Documenting the Problem One  Care Management Coordinator  Care Plan for Problem One  Not Active  [facility] Long Term Goal   Over the next 31 days, patient will not experience hospital re-admission, as evidenced by patient reporting and review of EMR during [place] outreach  [facility] Long Term Goal Start Date  04/27/18  [facility] Long Term Goal Met Date  05/28/18 [facility] Met]  Interventions for Problem One Long Term Goal  Confirmed that patient has not experienced hospital re-admission within 31 days of last discharge  [facility] CM Short Term Goal #2   Over the next 30 days, patient will actively participate in home health services as ordered post- hospital discharge, as evidenced by patient reporting and collaboration with home health team as indicated   [facility] CM Short Term Goal #2 Start Date  04/27/18  [facility] CM Short Term Goal #2 Met Date  05/28/18 [Goal Met]  Interventions for Short Term Goal #2  Discussed with patient [place] services, which were completed last week    [facility] CM Care Plan Problem Two     Most Recent Value  Care Plan Problem Two  Need for reinforcement of self-health management strategies for chronic disease state of CHF, as evidenced by patient reporting  Role Documenting the Problem Two  Care Management Coordinator  Care Plan for Problem Two  Active  Interventions for Problem Two Long Term Goal   Confirmed that patient continues monitoring and recording daily weights,  reviewed recent weights with patient and confirmed through teach-back that patient is able to verbalize action plan for weight gain and weight gain guidelines in setting of CHF  [facility] Long Term Goal  Over the next 60 days, patient will continue monitoring and recording daily weights, as evidenced by patient reporting and review of same during [place] outreach  [facility] Long Term Goal Start Date  04/27/18  [facility] CM Short Term Goal #1   Over the next 30 days, patient will re-review CHF zones and will be able to verbalize signs/ symptoms/ corresponding action plan for yellow CHF zone, as evidenced by patient reporting during [facility] RN CCm outreach  [facility] CM Short Term Goal #1 Start Date  05/28/18- goal extended today  Interventions for Short Term Goal #2  Confirmed that patient believes she in green CHF zone since last [facility] RN CCM outreach,  reiterated previously provided education around yellow/ red CHF zones along with corresponding action plan for both    [facility] CM Care Plan Problem Three     Most Recent Value  Care Plan Problem Three  Need for creation of Advanced Directives, as evidenced by patient reporting   Role Documenting the Problem Three  Care Management [place] for Problem Three  Active  [facility] Long Term Goal   Over the next 60 days, patient will review Advanced  Directive packet and discuss questions with [facility] RN CCM, as evidenced by patient reporting during [facility] RN CCM outreach  [facility] Long Term Goal Start Date  05/14/18  Interventions for Problem Three Long Term Goal  Discussed with patient her progress in reviewing AD planning packet and again encouraged patient to consider reviewing and completing     Oneta Rack, RN, BSN, [place] Coordinator [facility] Care Management  [phone]

## 2018-05-29 ENCOUNTER — Other Ambulatory Visit: Payer: Self-pay | Admitting: Internal Medicine

## 2018-05-29 ENCOUNTER — Telehealth: Payer: Self-pay | Admitting: Pulmonary Disease

## 2018-05-29 MED ORDER — METHADONE HCL 10 MG PO TABS
20.0000 mg | ORAL_TABLET | Freq: Four times a day (QID) | ORAL | 0 refills | Status: DC
Start: 2018-05-29 — End: 2018-07-14

## 2018-05-29 MED ORDER — NITROGLYCERIN 0.4 MG SL SUBL: 0.4000 mg | SUBLINGUAL_TABLET | SUBLINGUAL | 1 refills | Status: DC | PRN

## 2018-05-29 NOTE — Telephone Encounter (Signed)
Copied from [place] [phone]. Topic: Quick Communication - Rx Refill/Question >> May 22, 2018 10:23 AM Burchel, Abbi R wrote: Medication: methadone (DOLOPHINE) 10 MG tablet, nitroGLYCERIN (NITROSTAT) 0.4 MG SL tablet  Preferred Pharmacy: [place], [place] - [address]  [phone] (Phone) [phone] (Fax)    Pt was advised that RX refills may take up to 3 business days.

## 2018-05-29 NOTE — Telephone Encounter (Signed)
methadone refill Last Refill: 05/22/18 #240 Last OV: 05/13/18 PCP:Dr [place]: [place]  nitroglycerinrefill Last Refill::05/22/18 # 100 Last OV:05/13/18 PCP: Dr Silvio Pate [facility]

## 2018-05-29 NOTE — Telephone Encounter (Signed)
Let her know

## 2018-05-29 NOTE — Telephone Encounter (Signed)
Methadone done 1 week ago--make sure she picked that up Okay for the NTG

## 2018-05-29 NOTE — Telephone Encounter (Signed)
It was sent to [facility], I am sure by accident

## 2018-05-29 NOTE — Telephone Encounter (Signed)
Spoke with [place], home health nurse  She reports pt feels her breathing is improving on Tyvaso and she is having less itching than she was before  She wants to know if ok to start triturating her up from 6 puffs qid  I advised per last AVS BQ said that this would be ok for her to be titrated to a goal of 9 puffs qid  She states that she will try 7 puffs qid and see how pt does and go up from there She will call us to report any issues  Nothing further needed

## 2018-06-01 ENCOUNTER — Telehealth: Payer: Self-pay | Admitting: Internal Medicine

## 2018-06-01 NOTE — Telephone Encounter (Signed)
Left message on voicemail that requested medications were sent to [facility] on 8/2 and that pharmacy should be contacted in order to pick up prescriptions.

## 2018-06-01 NOTE — Telephone Encounter (Signed)
Copied from [place] #[id number]. Topic: Quick Communication - Rx Refill/Question >> Jun 01, 2018  4:25 PM Neva Seat wrote: methadone (DOLOPHINE) 10 MG tablet nitroGLYCERIN (NITROSTAT) 0.4 MG SL tablet  Pt states the medication was sent to wrong pharmacy.  Pt needs the Rx's to go to:  [facility] - [place], [place] [address] Phone: [phone] Fax: [phone]

## 2018-06-06 DIAGNOSIS — J449 Chronic obstructive pulmonary disease, unspecified: Secondary | ICD-10-CM | POA: Diagnosis not present

## 2018-06-06 DIAGNOSIS — I509 Heart failure, unspecified: Secondary | ICD-10-CM | POA: Diagnosis not present

## 2018-06-19 ENCOUNTER — Encounter: Payer: Self-pay | Admitting: Pulmonary Disease

## 2018-06-19 ENCOUNTER — Ambulatory Visit: Payer: PPO | Admitting: Pulmonary Disease

## 2018-06-19 VITALS — BP 158/90 | HR 93 | Ht 59.45 in | Wt 147.0 lb

## 2018-06-19 DIAGNOSIS — Z23 Encounter for immunization: Secondary | ICD-10-CM | POA: Diagnosis not present

## 2018-06-19 DIAGNOSIS — I272 Pulmonary hypertension, unspecified: Secondary | ICD-10-CM

## 2018-06-19 DIAGNOSIS — I2781 Cor pulmonale (chronic): Secondary | ICD-10-CM

## 2018-06-19 DIAGNOSIS — J449 Chronic obstructive pulmonary disease, unspecified: Secondary | ICD-10-CM

## 2018-06-19 NOTE — Patient Instructions (Signed)
Severe COPD: Continue Stiolto 2 puffs daily Flu shot today Practice good hand hygiene Stay active  Chronic respiratory failure with hypoxemia: Continue 4 to 5 L of oxygen continuously  Pulmonary hypertension: Continue Tyvaso 7 puffs per dose, 5-6 times per day Echo in October  We will see you back in 2 months or sooner if needed

## 2018-06-19 NOTE — Progress Notes (Signed)
Subjective:    Patient ID: Lindsay Harmon, female    DOB: 1956/08/23, 62 y.o.   MRN: [id number]   Synopsis: Referred by cardiology in 2016 for evaluation of severe COPD and possible obstructive sleep apnea in the setting of pulmonary hypertension. She also has congestive heart failure. A sleep study in 2016 showed no evidence of obstructive sleep apnea but she does have significant nocturnal hypoxemia. Spirometry testing in 2016 confirmed a diagnosis of COPD with severe airflow obstruction FEV1 41% pred.  She smoked cigarettes for many years and she quit smoking in 2013 after smoking 2 ppd for 40 years.   HPI Chief Complaint  Patient presents with  . Follow-up   Lindsay Harmon says that her breathing continues to improve.  She has been taking Tyvaso 7 puffs at a time, several times per day.  However, when she increased to 8 puffs a day she said that she had more throat soreness and a little bit increase in wheezing and shortness of breath so she backed back down to 7.  She continues to use and benefit from the [employer]. She continues to use 4 L of oxygen at home, 5 L when she is walking around.  She would like a flu shot today.  No recent bronchitis.  No leg swelling.  Past Medical History:  Diagnosis Date  . Allergy   . Aortic atherosclerosis ([place])   . Arthritis   . CKD (chronic kidney disease), stage IV ([place])   . COPD (chronic obstructive pulmonary disease) ([place])    a. 11/2014 PFT's: FEV1 0.87 (38%), FVC 1.41 (48%), FEF 25-75 0.41 (19%). Unable to perform DLCO; b. 12/2017 Simple spirometry ration 67%. FEV1 1.21 L+53% predicted. FVC 1.79L 61% predicted.  . Coronary artery calcification seen on CT scan    a. 03/2017 CT Chest.  . Depression   . Diabetic neuropathy ([place])   . Esophageal stricture   . Familial hematuria   . GERD (gastroesophageal reflux disease)   . Hyperlipidemia   . Hypertension   . IBS (irritable bowel syndrome)   . Nephrolithiasis   . NIDDM (non-insulin dependent diabetes  mellitus)   . Obesity, unspecified   . Pulmonary hypertension ([place])    a. WHO Group 3; b. 10/2016 Echo: EF 55-60%, no rwma, Gr1 DD, mild to mod AS, mean grad 29mHg, mildly dil RV w/ mildly reduced RV fxn, mildly dil RA. PASP 1172mg; c. 12/2017 RHC: RA 11, RV 92/12, PA 92/33(57), PCWP 15, Fick CO/CI 5.9/3.3. PVR 7.2 WU. Ao 93%, PA 62/64%.  . Marland KitchenVD (peripheral vascular disease) ([place]      Review of Systems  Constitutional: Positive for fatigue. Negative for diaphoresis and fever.  HENT: Negative for postnasal drip, rhinorrhea and sinus pressure.   Respiratory: Negative for cough, shortness of breath and wheezing.   Cardiovascular: Negative for chest pain, palpitations and leg swelling.       Objective:   Physical Exam  Vitals:   06/19/18 1510  BP: (!) 158/90  Pulse: 93  SpO2: 90%  Weight: 147 lb (66.7 kg)  Height: 4' 11.45" (1.51 m)   5L [place] pulse  Gen: chronically ill appearing HENT: OP clear, TM's clear, neck supple PULM: Poor air movement, some wheezing B, normal percussion CV: RRR, no mgr, trace edema GI: BS+, soft, nontender Derm: no cyanosis or rash Psyche: normal mood and affect     CBC    Component Value Date/Time   WBC 16.1 (H) 04/24/2018 0441   RBC 5.47 (H)  04/24/2018 0441   HGB 10.7 (L) 04/24/2018 0441   HCT 36.7 04/24/2018 0441   PLT 184 04/24/2018 0441   MCV 67.2 (L) 04/24/2018 0441   MCH 19.5 (L) 04/24/2018 0441   MCHC 29.1 (L) 04/24/2018 0441   RDW 21.4 (H) 04/24/2018 0441   LYMPHSABS 1.3 04/19/2018 2214   MONOABS 1.1 (H) 04/19/2018 2214   EOSABS 0.0 04/19/2018 2214   BASOSABS 0.1 04/19/2018 2214   Echo: December 2018 echocardiogram showed a normal LVEF RV was mildly dilated with an RVSP greater than 100  RHC 12/05/2014 RA 12 mmHg (mean) with O2 sat 72%; RV 97/16 mmHg with O2 sat 73%; PA 97/30 mmHg with O2 sat 67%; PCWP(mean) 14 mmHg (mean); Cardiac Output 5.78 L/min Right heart catheterization March 2019: PA pressure 92/33, mean 57, pulmonary  vascular resistance 7.2, cardiac output  5.9, pulmonary capillary wedge pressure 15.  Chest imaging: CT scan chest June 2018 images independently reviewed showing mild to moderate centrilobular emphysema in the upper lobes, some mosaicism in the bases. Feb 2016 V/Q IMPRESSION: No evidence of pulmonary embolism.   Pulmonary function testing: March 2019 simple spirometry ratio 67%, FEV1 1.21 L 53% predicted, FVC 1.79 L 61% predicted  Records from her recent visit with her primary care physician reviewed in July 2019 where she was seen for her COPD and other conditions.  Management was continued.     Assessment & Plan:   Pulmonary hypertension ([place]) - Plan: ECHOCARDIOGRAM COMPLETE  Chronic obstructive pulmonary disease, unspecified COPD type ([place])  Cor pulmonale, chronic ([place])  Discussion: Lindsay Harmon has done really well with the addition of Tyvaso.  However, 8 puffs per dose was too much for her and I think caused throat irritation.  She seems to be doing well with 7 puffs several times a day.  I like to continue this dose for 2 months and then repeat an echocardiogram.  I am not sure that adding a systemic visit pulmonary vasodilator will be effective though because she did not do well with sildenafil.  Plan: Severe COPD: Continue Stiolto 2 puffs daily Flu shot today Practice good hand hygiene Stay active  Chronic respiratory failure with hypoxemia: Continue 4 to 5 L of oxygen continuously  Pulmonary hypertension: Continue Tyvaso 7 puffs per dose, 5-6 times per day Echo in October  We will see you back in 2 months or sooner if needed    Current Outpatient Medications:  .  albuterol (VENTOLIN HFA) 108 (90 Base) MCG/ACT inhaler, USE 2 PUFFS EVERY SIX HOURS AS NEEDED FOR WHEEZING OR SHORTNESS OF BREATH, Disp: 18 g, Rfl: 3 .  aspirin 325 MG tablet, Take 325 mg by mouth at bedtime. , Disp: , Rfl:  .  calcitRIOL (ROCALTROL) 0.5 MCG capsule, Take 0.5 mcg by mouth daily. , Disp: ,  Rfl:  .  cholecalciferol (VITAMIN D) 1000 units tablet, Take 1,000 Units by mouth at bedtime. , Disp: , Rfl:  .  ferrous sulfate 325 (65 FE) MG tablet, Take 1 tablet (325 mg total) by mouth 2 (two) times daily with a meal., Disp: 60 tablet, Rfl: 3 .  glipiZIDE (GLUCOTROL) 5 MG tablet, Take 5 mg by mouth See admin instructions. Take 1 tablet is blood sugar is over 160., Disp: , Rfl:  .  insulin glargine (LANTUS) 100 UNIT/ML injection, Inject 20 Units into the skin daily., Disp: , Rfl:  .  loratadine (CLARITIN) 10 MG tablet, Take 10 mg by mouth 2 (two) times daily., Disp: , Rfl:  .  lovastatin (MEVACOR) 40 MG tablet, Take 2 tablets (80 mg total) by mouth at bedtime., Disp: 180 tablet, Rfl: 3 .  methadone (DOLOPHINE) 10 MG tablet, Take 2 tablets (20 mg total) by mouth 4 (four) times daily., Disp: 240 tablet, Rfl: 0 .  metoprolol tartrate (LOPRESSOR) 25 MG tablet, Take 1/2 tablet by mouth twice a day, Disp: 90 tablet, Rfl: 3 .  nitroGLYCERIN (NITROSTAT) 0.4 MG SL tablet, Place 1 tablet (0.4 mg total) under the tongue every 5 (five) minutes as needed for chest pain., Disp: 100 tablet, Rfl: 1 .  ondansetron (ZOFRAN ODT) 4 MG disintegrating tablet, Take 1 tablet (4 mg total) by mouth every 8 (eight) hours as needed for nausea or vomiting., Disp: 20 tablet, Rfl: 0 .  pantoprazole (PROTONIX) 40 MG tablet, Take 1 tablet by mouth twice a day, Disp: 180 tablet, Rfl: 3 .  Tiotropium Bromide-Olodaterol (STIOLTO RESPIMAT) 2.5-2.5 MCG/ACT AERS, Inhale 2 puffs into the lungs daily., Disp: 4 g, Rfl: 5 .  torsemide (DEMADEX) 20 MG tablet, Take 2 tablets (40 mg total) by mouth daily., Disp: 180 tablet, Rfl: 3 .  Treprostinil (TYVASO) 0.6 MG/ML SOLN, Inhale 18 mcg into the lungs 4 (four) times daily., Disp: , Rfl:

## 2018-06-22 ENCOUNTER — Other Ambulatory Visit: Payer: Self-pay | Admitting: *Deleted

## 2018-06-22 ENCOUNTER — Encounter: Payer: Self-pay | Admitting: *Deleted

## 2018-06-22 NOTE — Patient Outreach (Signed)
[place] [facility]) Care Management [place] Telephone Outreach Post-hospital discharge day # 58  06/22/2018  PERSEPHANIE LAATSCH 04-Feb-1956 [id number]  Successful telephone outreach to [name], 62 y.o.femalereferred to [place] after recent hospitalization June 23-28, 2019 for hypoxic respiratory failure secondary to severe pulmonary HTN;Patient has history including, but not limited to, dCHF; COPD, on home O2 at 4-5 L/min via [place]; DM- Type II; CKD- stage IV; pulmonary HTN; HTN; PVD; and GERD.HIPAA/ identity verified with patient today.    Today, patient states that she "is doingabout the same; feeling good," and she denies pain outside of her normal/ new/ recent falls, problems/ concerns.  Patient again reports that she has continued staying at her mother's home post-hospital discharge, but continues driving herself to provider appointments.   Patient sounds to be in no obvious distress throughout entirety of phone call today.  Patient further reports:  -- no concerns/ issues/ or questions around her currently prescribed medications; states that her Remeron has been discontinued, as she "no longer needs" after speaking with pulmonology provider at recent office visit   --Provider appointments: Attended recent pulmonology provider office visit 06/19/18- reports "went well," and denies new instructions/ changes around overall plan of care post-office visit;  Obtained flu shot during pulmonology provider office visit-- positive reinforcement provided.  States that pulmonologist has recommended follow up ECHOcardiogram, which is now scheduled for October 2019.  Reviewed pulmonology office visit notes with patient; patient denies questions/ concerns after office visit.  Reports to see PCP as scheduled on 07/14/18; and states that she plans ot transport self to this appointment.  -- Advanced Directive Planning: states that she has reviewed previously provided AD  planning packet/ educational material; states she "has slowly" begun to fill out documents; patient denies questions around AD planning, and I reiterated with her process to take for notarization of AD documents.  Patient reports to have this completed soon.  Encouraged patient to promptly finish documents/ notarize since she has been working actively on this.  Self-health management ofchronic disease state of CHF: -- has continuedmonitoring/ recordingdaily weights as a part of her daily routine, able toverbalizeaccuratelyweight gain guidelines in setting of CHF to report weight gain > 3 lbs overnight/ 5 lbs in one week to care provider -- home recorded daily weights reviewed with patient today: Weights reported consistently between 146-147 lbs, with stated weight today of "147 pounds."  Denies fluctuations in weight ranges > 2 lbs since last [facility] RN CCM outreach -- continues using home O2 at 4-5 L/min via [place]/ oxygenator, without need to increase O2; states that she continues using pulse oxygen tank when she goes out of the home/ drives to appointments -- denies recent episodes shortness of breath/ dyspnea-- stated that she had an isolated episode of feeling "winded" after using prescribed tivaso after the dosing was increased from "7 puffs to 8 puffs;" states that she independently decreased dose "back to 7 puffs," which prevented this from recurring.  She reported this to pulmonologist at time of recent office visit, and dose remains at 7 puffs QD without further episodes. --states she is in "green" CHF zone today, "has been" since our last conversation.  Patient is able to independently verbalize signs/ symptoms of both yellow and red zones, along with appropriate action plans for both zone; positive reinforcement provided -- discussed patient's adherence to low salt diet; stated that she continues using "very little salt," and continues "reading all food labels," as well as "mainly eating  fresh  or frozen food, never canned foods."  Patient verbalizes that she believes she is "following low salt diet just like" she is supposed to, and she denies need for additional education around diet in setting of CHF -- discussed with patient recent recommendation by pulmonology provider to increase activity; patient stated that she would like to increase activity gradually around periods of hot weather; positive reinforcement provided and we discussed possibility of her using her insurance benefit for wellness; discussed benefits of obtaining regular structured exercise for health maintenance/ improvement, and patient stated that she "might do" this in the future; I encouraged her to contact her insurance provider to inquire about specifics of wellness benefit, and she stated she would consider doing.  Patient denies further issues, concerns, or problems today. Iconfirmed that patient hasmy direct phone number, the main [facility] CM office phone number, and the [facility] CM 24-hour nurse advice phone number should issues arise prior to next scheduled [place] phone call next month for possible transfer to [place], as patient has continued making good progress in meeting her previously established [place] CM goals.Encouraged patient to contact me directlyor [facility] CM officeif needs, questions, issues, or concerns arise prior to next scheduled outreach; patient agreed to do so.  Plan:  Patient will take medications as prescribed and will attend all scheduled provider appointments  Patient will promptly notify care providers for any new concerns/ issues/ problems that arise  Patient will continue monitoring/ recording daily weightsand following weight gain guidelines in setting of CHF  Patient will continue making progress toward having Advanced Directive documents completed  [facility] CM outreach to continue with scheduledphone call next month possibly for  transfer to [place] Problem Two     Most Recent Value  Care Plan Problem Two  Need for reinforcement of self-health management strategies for chronic disease state of CHF, as evidenced by patient reporting  Role Documenting the Problem Two  Care Management [place] for Problem Two  Active  Interventions for Problem Two Long Term Goal   Confirmed that patient continues monitoring and recording daily weights and reviewed recent daily weights with patient,  confirmed that patient is able to accurately verbalize weight gain guidelines in setting of CHF, along with corresponding action plan for same  [facility] Long Term Goal  Over the next 60 days, patient will continue monitoring and recording daily weights, as evidenced by patient reporting and review of same during [place] outreach  [facility] Long Term Goal Start Date  04/27/18  [facility] Long Term Goal Met Date  06/22/18 [Goal Met]  [facility] CM Short Term Goal #1   Over the next 30 days, patient will re-review CHF zones and will be able to verbalize signs/ symptoms/ corresponding action plan for yellow CHF zone, as evidenced by patient reporting during [facility] RN CCm outreach  [facility] CM Short Term Goal #1 Start Date  05/28/18 [Goal extended today]  [facility] CM Short Term Goal #1 Met Date   06/22/18 [Goal Met]  Interventions for Short Term Goal #2   Confirmed that patient is able to accurately and independently verbalize signs/ symptoms of yellow and red CHF zones, along with corresponding action plans for both zones,  confirmed that patient has remained in green CHF zone since last [facility] CCM RN outreach  [facility] CM Short Term Goal #2   Over the next 30 days, patient will attend all provider  appointments and will promptly notify care providers for any new problems, concerns, or issues, as evidenced by patient reporting during [facility] RN CCM outreach  [facility] CM Short Term Goal #2 Start Date  06/22/18  Interventions for Short Term Goal #2  Reviewed all  recent provider appointments/ office visits with patient,  discussed upcoming scheduled ECHOcardiogram and provider appointments scheduled for future    [facility] CM Care Plan Problem Three     Most Recent Value  Care Plan Problem Three  Need for creation of Advanced Directives, as evidenced by patient reporting   Role Documenting the Problem Three  Care Management [place] for Problem Three  Active  [facility] Long Term Goal   Over the next 31 days, patient will review Advanced Directive packet and discuss questions with [facility] RN CCM, as evidenced by patient reporting during [facility] RN CCM outreach [Goal extended/ modified today]  [facility] Long Term Goal Start Date  06/22/18  Interventions for Problem Three Long Term Goal  Discussed patient's progress with creation of AD's,  confirmed that patient does not have questions around AD planning,  encouraged patient to act on her progress by having AD's notified and reviewed with her process to have AD's notarized and places she can take AD documents for notarization     Oneta Rack, RN, BSN, [employer] Coordinator [facility] Care Management  [phone]

## 2018-07-07 DIAGNOSIS — I509 Heart failure, unspecified: Secondary | ICD-10-CM | POA: Diagnosis not present

## 2018-07-07 DIAGNOSIS — J449 Chronic obstructive pulmonary disease, unspecified: Secondary | ICD-10-CM | POA: Diagnosis not present

## 2018-07-13 DIAGNOSIS — E871 Hypo-osmolality and hyponatremia: Secondary | ICD-10-CM | POA: Diagnosis not present

## 2018-07-13 DIAGNOSIS — E1122 Type 2 diabetes mellitus with diabetic chronic kidney disease: Secondary | ICD-10-CM | POA: Diagnosis not present

## 2018-07-13 DIAGNOSIS — R600 Localized edema: Secondary | ICD-10-CM | POA: Diagnosis not present

## 2018-07-13 DIAGNOSIS — N184 Chronic kidney disease, stage 4 (severe): Secondary | ICD-10-CM | POA: Diagnosis not present

## 2018-07-13 DIAGNOSIS — N2581 Secondary hyperparathyroidism of renal origin: Secondary | ICD-10-CM | POA: Diagnosis not present

## 2018-07-14 ENCOUNTER — Ambulatory Visit (INDEPENDENT_AMBULATORY_CARE_PROVIDER_SITE_OTHER): Payer: PPO | Admitting: Internal Medicine

## 2018-07-14 ENCOUNTER — Encounter: Payer: Self-pay | Admitting: Internal Medicine

## 2018-07-14 VITALS — BP 112/76 | HR 65 | Temp 97.7°F | Ht 59.0 in | Wt 146.0 lb

## 2018-07-14 DIAGNOSIS — I5032 Chronic diastolic (congestive) heart failure: Secondary | ICD-10-CM

## 2018-07-14 DIAGNOSIS — G8929 Other chronic pain: Secondary | ICD-10-CM | POA: Diagnosis not present

## 2018-07-14 DIAGNOSIS — M5440 Lumbago with sciatica, unspecified side: Secondary | ICD-10-CM

## 2018-07-14 DIAGNOSIS — IMO0002 Reserved for concepts with insufficient information to code with codable children: Secondary | ICD-10-CM

## 2018-07-14 DIAGNOSIS — E1151 Type 2 diabetes mellitus with diabetic peripheral angiopathy without gangrene: Secondary | ICD-10-CM

## 2018-07-14 DIAGNOSIS — I272 Pulmonary hypertension, unspecified: Secondary | ICD-10-CM

## 2018-07-14 DIAGNOSIS — E1165 Type 2 diabetes mellitus with hyperglycemia: Secondary | ICD-10-CM | POA: Diagnosis not present

## 2018-07-14 DIAGNOSIS — F1120 Opioid dependence, uncomplicated: Secondary | ICD-10-CM

## 2018-07-14 LAB — POCT GLYCOSYLATED HEMOGLOBIN (HGB A1C): Hemoglobin A1C: 7.5 % — AB (ref 4.0–5.6)

## 2018-07-14 MED ORDER — METHADONE HCL 10 MG PO TABS: 20.0000 mg | ORAL_TABLET | Freq: Four times a day (QID) | ORAL | 0 refills | Status: DC

## 2018-07-14 NOTE — Addendum Note (Signed)
Addended by: Pilar Grammes on: 07/14/2018 11:44 AM   Modules accepted: Orders

## 2018-07-14 NOTE — Assessment & Plan Note (Signed)
Doing okay with the methadone for a long time Tries to limit use on better days

## 2018-07-14 NOTE — Progress Notes (Signed)
Subjective:    Patient ID: Lindsay Harmon, female    DOB: 01/01/1956, 62 y.o.   MRN: [id number]  HPI Here for follow up of chronic pain issues---and other chronic health conditions  Still doing well with the treatment for the severe pulmonary hypertension No edema Continuous oxygen still---able to walk considerably more since on the new medication Didn't tolerate the higher dose of 8 puffs (actually worsened her exercise tolerance and caused abdominal pain). Backed back down to 7 puffs (4 times a day)  Known CKD IV Saw Dr Holley Raring yesterday--he was pleased (and did blood work)  1 episode of chest pain Better with 1 nitro No dizziness or syncope  Satisfied with pain control on the methadone Takes it regularly---tries to only use it 3 times a day if having a better day  Sugars have been better 109-130 fasting No recent hypoglycemia  Current Outpatient Medications on File Prior to Visit  Medication Sig Dispense Refill  . albuterol (VENTOLIN HFA) 108 (90 Base) MCG/ACT inhaler USE 2 PUFFS EVERY SIX HOURS AS NEEDED FOR WHEEZING OR SHORTNESS OF BREATH 18 g 3  . aspirin 325 MG tablet Take 325 mg by mouth at bedtime.     . calcitRIOL (ROCALTROL) 0.5 MCG capsule Take 0.5 mcg by mouth daily.     . cholecalciferol (VITAMIN D) 1000 units tablet Take 1,000 Units by mouth at bedtime.     . ferrous sulfate 325 (65 FE) MG tablet Take 1 tablet (325 mg total) by mouth 2 (two) times daily with a meal. 60 tablet 3  . glipiZIDE (GLUCOTROL) 5 MG tablet Take 5 mg by mouth See admin instructions. Take 1 tablet is blood sugar is over 160.    Marland Kitchen insulin glargine (LANTUS) 100 UNIT/ML injection Inject 20 Units into the skin daily.    Marland Kitchen loratadine (CLARITIN) 10 MG tablet Take 10 mg by mouth 2 (two) times daily.    Marland Kitchen lovastatin (MEVACOR) 40 MG tablet Take 2 tablets (80 mg total) by mouth at bedtime. 180 tablet 3  . methadone (DOLOPHINE) 10 MG tablet Take 2 tablets (20 mg total) by mouth 4 (four) times daily.  240 tablet 0  . metoprolol tartrate (LOPRESSOR) 25 MG tablet Take 1/2 tablet by mouth twice a day 90 tablet 3  . nitroGLYCERIN (NITROSTAT) 0.4 MG SL tablet Place 1 tablet (0.4 mg total) under the tongue every 5 (five) minutes as needed for chest pain. 100 tablet 1  . ondansetron (ZOFRAN ODT) 4 MG disintegrating tablet Take 1 tablet (4 mg total) by mouth every 8 (eight) hours as needed for nausea or vomiting. 20 tablet 0  . pantoprazole (PROTONIX) 40 MG tablet Take 1 tablet by mouth twice a day 180 tablet 3  . Tiotropium Bromide-Olodaterol (STIOLTO RESPIMAT) 2.5-2.5 MCG/ACT AERS Inhale 2 puffs into the lungs daily. 4 g 5  . torsemide (DEMADEX) 20 MG tablet Take 2 tablets (40 mg total) by mouth daily. 180 tablet 3  . Treprostinil (TYVASO) 0.6 MG/ML SOLN Inhale 18 mcg into the lungs 4 (four) times daily.     No current facility-administered medications on file prior to visit.     Allergies  Allergen Reactions  . Sertraline Hcl Other (See Comments)    Cloudy thoughts, malaise    Past Medical History:  Diagnosis Date  . Allergy   . Aortic atherosclerosis ([place])   . Arthritis   . CKD (chronic kidney disease), stage IV ([place])   . COPD (chronic obstructive pulmonary disease) ([place])  a. 11/2014 PFT's: FEV1 0.87 (38%), FVC 1.41 (48%), FEF 25-75 0.41 (19%). Unable to perform DLCO; b. 12/2017 Simple spirometry ration 67%. FEV1 1.21 L+53% predicted. FVC 1.79L 61% predicted.  . Coronary artery calcification seen on CT scan    a. 03/2017 CT Chest.  . Depression   . Diabetic neuropathy ([place])   . Esophageal stricture   . Familial hematuria   . GERD (gastroesophageal reflux disease)   . Hyperlipidemia   . Hypertension   . IBS (irritable bowel syndrome)   . Nephrolithiasis   . NIDDM (non-insulin dependent diabetes mellitus)   . Obesity, unspecified   . Pulmonary hypertension ([place])    a. WHO Group 3; b. 10/2016 Echo: EF 55-60%, no rwma, Gr1 DD, mild to mod AS, mean grad 39mHg, mildly dil RV w/ mildly  reduced RV fxn, mildly dil RA. PASP 1155mg; c. 12/2017 RHC: RA 11, RV 92/12, PA 92/33(57), PCWP 15, Fick CO/CI 5.9/3.3. PVR 7.2 WU. Ao 93%, PA 62/64%.  . Marland KitchenVD (peripheral vascular disease) ([place]    Past Surgical History:  Procedure Laterality Date  . ABDOMINAL HYSTERECTOMY    . CARPAL TUNNEL RELEASE    . CESAREAN SECTION    . ERCP N/A 03/17/2017   Procedure: ENDOSCOPIC RETROGRADE CHOLANGIOPANCREATOGRAPHY (ERCP);  Surgeon: WoLucilla LameMD;  [facility];  Service: Endoscopy;  Laterality: N/A;  . EUS N/A 03/13/2017   Procedure: FULL UPPER ENDOSCOPIC ULTRASOUND (EUS) RADIAL;  Surgeon: Burbridge, ReMurray HodgkinsMD;  Location: [facility] ENDOSCOPY;  Service: Endoscopy;  Laterality: N/A;  . RIGHT HEART CATH N/A 01/22/2018   Procedure: RIGHT HEART CATH;  Surgeon: BeJolaine ArtistMD;  [facility];  Service: Cardiovascular;  Laterality: N/A;  . RIGHT HEART CATHETERIZATION N/A 12/05/2014   Procedure: RIGHT HEART CATH;  Surgeon: HeSinclair GroomsMD;  [facility];  Service: Cardiovascular;  Laterality: N/A;    Family History  Problem Relation Age of Onset  . Diabetes Mother   . Cancer Mother        ovarian,melanoma  . Allergies Father   . Cancer Father        lung  . Cancer Maternal Grandmother        uterine  . Emphysema Paternal Grandfather   . Allergies Sister   . Breast cancer Neg Hx     Social History   Socioeconomic History  . Marital status: Legally Separated    Spouse name: Not on file  . Number of children: 1  . Years of education: Not on file  . Highest education level: Not on file  Occupational History  . 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 Financial resource strain: Not on file  . Food insecurity:    Worry: Not on file    Inability: Not on file  . Transportation needs:    Medical: Not on file    Non-medical: Not on file  Tobacco Use  . Smoking status: Former Smoker    Packs/day:  1.50    Years: 20.00    Pack years: 30.00    Types: Cigarettes    Last attempt to quit: 01/04/2009    Years since quitting: 9.5  . Smokeless tobacco: Never Used  Substance and Sexual Activity  . Alcohol use: No    Alcohol/week: 0.0 standard drinks    Comment: heavy in the past  . Drug use: No  . Sexual activity: Never  Lifestyle  .  Physical activity:    Days per week: Not on file    Minutes per session: Not on file  . Stress: Not on file  Relationships  . Social connections:    Talks on phone: Not on file    Gets together: Not on file    Attends religious service: Not on file    Active member of club or organization: Not on file    Attends meetings of clubs or organizations: Not on file    Relationship status: Not on file  . Intimate partner violence:    Fear of current or ex partner: Not on file    Emotionally abused: Not on file    Physically abused: Not on file    Forced sexual activity: Not on file  Other Topics Concern  . Not on file  Social History Narrative   No living will   Son [name] (then mom) should make decisions for her if she is unable.    Would accept resuscitation attempts but no prolonged ventilation   Not sure about tube feeds but probably wouldn't want them if cognitively unaware   Review of Systems  Now able to sleep lying down. Sleeping well Appetite is fine Bowels are fine     Objective:   Physical Exam  Constitutional: She appears well-developed. No distress.  Neck: No thyromegaly present.  Cardiovascular: Normal rate, regular rhythm and normal heart sounds. Exam reveals no gallop.  No murmur heard. Respiratory: Effort normal. No respiratory distress. She has no wheezes. She has no rales.  Decreased breath sounds--but clear  Musculoskeletal: She exhibits no edema.  Lymphadenopathy:    She has no cervical adenopathy.  Psychiatric: She has a normal mood and affect. Her behavior is normal.           Assessment & Plan:

## 2018-07-14 NOTE — Assessment & Plan Note (Signed)
Reviewed CSRS--no concerns

## 2018-07-14 NOTE — Assessment & Plan Note (Signed)
Markedly better with the tyvaso

## 2018-07-14 NOTE — Assessment & Plan Note (Addendum)
Seems to have better control  Lab Results  Component Value Date   HGBA1C 7.5 (A) 07/14/2018    Congratulations! Will stop the glipizide completely

## 2018-07-14 NOTE — Assessment & Plan Note (Signed)
Compensated now with her Rx for pulm HTN

## 2018-07-15 LAB — PAIN MGMT, PROFILE 8 W/CONF, U
6 ACETYLMORPHINE: NEGATIVE ng/mL (ref <10)
ALCOHOL METABOLITES: NEGATIVE ng/mL (ref <500)
Amphetamines: NEGATIVE ng/mL (ref <500)
BUPRENORPHINE, URINE: NEGATIVE ng/mL (ref <5)
Benzodiazepines: NEGATIVE ng/mL (ref <100)
CREATININE: 24.3 mg/dL
Cocaine Metabolite: NEGATIVE ng/mL (ref <150)
MARIJUANA METABOLITE: NEGATIVE ng/mL (ref <20)
MDMA: NEGATIVE ng/mL (ref <500)
Opiates: NEGATIVE ng/mL (ref <100)
Oxidant: NEGATIVE ug/mL (ref <200)
Oxycodone: NEGATIVE ng/mL (ref <100)
PH: 6.74 (ref 4.5–9.0)

## 2018-07-22 ENCOUNTER — Encounter: Payer: Self-pay | Admitting: *Deleted

## 2018-07-22 ENCOUNTER — Other Ambulatory Visit: Payer: Self-pay | Admitting: *Deleted

## 2018-07-22 NOTE — Patient Outreach (Signed)
[place] [facility]) Care Management [place] Telephone Outreach Post-hospital discharge day # 88  07/22/2018  OSCEOLA HOLIAN Mar 19, 1956 [id number]  10:05 am: Unsuccessful telephone outreach toSherry Amada Kingfisher y.o.femalereferred to [place] after recent hospitalization June 23-28, 2019 for hypoxic respiratory failure secondary to severe pulmonary HTN;Patient has history including, but not limited to, dCHF; COPD, on home O2 at 4-5 L/min via [place]; DM- Type II; CKD- stage IV; pulmonary HTN; HTN; PVD; and GERD.  HIPAA compliant voice mail message left for patient, requesting return call back.  Plan:  Will re-attempt [place] telephone outreach later this week if I do not hear back from patient first.  Oneta Rack, RN, BSN, [place] Coordinator [facility] Care Management  [phone]

## 2018-07-24 ENCOUNTER — Other Ambulatory Visit: Payer: Self-pay | Admitting: *Deleted

## 2018-07-24 ENCOUNTER — Ambulatory Visit: Payer: Self-pay

## 2018-07-24 ENCOUNTER — Encounter: Payer: Self-pay | Admitting: *Deleted

## 2018-07-24 MED ORDER — ALPRAZOLAM 0.25 MG PO TABS: 0.2500 mg | ORAL_TABLET | Freq: Every day | ORAL | 0 refills | Status: AC | PRN

## 2018-07-24 NOTE — Telephone Encounter (Signed)
Pt. Reports she has felt "jittery inside for about 3 days." Recently saw her "kidney doctor who is sending me to a hematologist for an elevated Hgb." Reports she has been nervous about this. " Reports her BP, BS and O2 levels have been good. States she has had Alprazolam in the past, and is requesting Dr. Silvio Pate to send some to her pharmacy. Has hematology appointment 07/28/18. Please advise pt. Uses [place]. Saw Dr. Silvio Pate 07/14/18.  Answer Assessment - Initial Assessment Questions 1. CONCERN: "What happened that made you call today?"      I feel jittery inside 2. ANXIETY SYMPTOM SCREENING: "Can you describe how you have been feeling?"  (e.g., tense, restless, panicky, anxious, keyed up, trouble sleeping, trouble concentrating)     Restless 3. ONSET: "How long have you been feeling this way?"     For 3 days 4. RECURRENT: "Have you felt this way before?"  If yes: "What happened that time?" "What helped these feelings go away in the past?"      Yes 5. RISK OF HARM - SUICIDAL IDEATION:  "Do you ever have thoughts of hurting or killing yourself?"  (e.g., yes, no, no but preoccupation with thoughts about death)   - INTENT:  "Do you have thoughts of hurting or killing yourself right NOW?" (e.g., yes, no, N/A)   - PLAN: "Do you have a specific plan for how you would do this?" (e.g., gun, knife, overdose, no plan, N/A)     Yes 6. RISK OF HARM - HOMICIDAL IDEATION:  "Do you ever have thoughts of hurting or killing someone else?"  (e.g., yes, no, no but preoccupation with thoughts about death)   - INTENT:  "Do you have thoughts of hurting or killing someone right NOW?" (e.g., yes, no, N/A)   - PLAN: "Do you have a specific plan for how you would do this?" (e.g., gun, knife, no plan, N/A)      No intent 7. FUNCTIONAL IMPAIRMENT: "How have things been going for you overall in your life? Have you had any more difficulties than usual doing your normal daily activities?"  (e.g., better, same,  worse; self-care, school, work, interactions)     No changes 8. SUPPORT: "Who is with you now?" "Who do you live with?" "Do you have family or friends nearby who you can talk to?"      Has good family sister 56. THERAPIST: "Do you have a counselor or therapist? Name?"     No 10. STRESSORS: "Has there been any new stress or recent changes in your life?"       Yes 11. CAFFEINE ABUSE: "Do you drink caffeinated beverages, and how much each day?" (e.g., coffee, tea, colas)       No 12. SUBSTANCE ABUSE: "Do you use any illegal drugs or alcohol?"       No 13. OTHER SYMPTOMS: "Do you have any other physical symptoms right now?" (e.g., chest pain, palpitations, difficulty breathing, fever)       No 14. PREGNANCY: "Is there any chance you are pregnant?" "When was your last menstrual period?"       No  Protocols used: ANXIETY AND PANIC ATTACK-A-AH

## 2018-07-24 NOTE — Patient Outreach (Signed)
[facility]) Care Management [facility] CM Telephone Outreach- case closure [facility] CM Referral to [place]  07/24/2018  Lindsay Harmon [birth year] [id number]  Successful telephone outreach toSherry Amada Harmon y.o.femalereferred to [place] after recent hospitalization June 23-28, 2019 for hypoxic respiratory failure secondary to severe pulmonary HTN;Patient has history including, but not limited to, dCHF; COPD, on home O2 at 4-5 L/min via [place]; DM- Type II; CKD- stage IV; pulmonary HTN; HTN; PVD; and GERD.HIPAA/ identity verified with patient today.  Today, patient states that she "is doingabout the same; feeling pretty good," however, she reports that she has been "feeling jittery" over the last several days; states, "for about 3 days, I have this nervous quivery feeling every now and then inside my body."  Reports that she has contacted her PCP about this, as she is unsure why she is having this feeling.  She denies that she has been on any new medications, and also denies signs/ symptoms concerning for CHF exacerbation.  Denies pain outside of her normal, as well as new/ recent falls, problems/ concerns. Patient again reports that she has continued staying at her mother's home post-hospital discharge, but continues driving herself to provider appointments.  Patient sounds to be in no obvious distress throughout entirety of phone call today.  Patient denies any "known reason" that she may be feeling anxious, but she acknowledges that she has had anxiety in the past.  Patient further reports:  -- no concerns/ issues/ or questions around her currently prescribed medications;continues self- managing medications independently  --Provider appointments: Attended recent renal provider office visit on 07/13/18- reports "went well," and again denies new instructions/ changes around overall plan of care post-office visit; states lab work was drawn, and she  was told labs "looked good;" also reports recent PCP office visit on 07/14/18, confirming again that "no changes were made" as a result of office visit.  Reviewed upcoming scheduled appointments on Oct. 8, 2019 for ECHOcardiogram and then cardiology office visit on August 14, 2018 to discuss ECHO results/ evaluate heart function while she has been on Tyvaso.  Verbalizes plans to attend all upcoming scheduled provider appointments; plans to continue transporting self to appointments.  -- Advanced Directive Planning:again states that she has reviewed previously providedAD planning packet/ educational material but has not yet completed/ had notarized; encouraged patient to continue considering completing this soon, and she verbalized understanding and agreement, and denies further questions around AD planning.  Self-health management ofchronic disease state of CHF: -- has continuedmonitoring/ recordingdaily weights as a part of her daily routine,able toverbalizeaccuratelyweight gain guidelines in setting of CHF to report weight gain >3 lbs overnight/ 5 lbs in one week to care provider -- home recorded daily weights reviewed with patient today: Weightsreportedconsistently between 143-145 lbs, with stated weight today of "144 pounds." Denies fluctuations in weight ranges > 2 lbs since last [facility] RN CCM outreach --continuesusinghome O2 at 4-5 L/min via [place]/ oxygenator, without need to increase O2; states that she continues using pulse oxygen tank when she goes out of the home/ drives to appointments -- deniesrecent episodesshortness of breath/ dyspnea-- we discussed previously provided educational material around signs/ symptoms of yellow zone CHF; patient is able to verbalize accurate report of signs/ symptoms along with corresponding action plan.  Again reports that she believes that she is in the "green CHF zone" -- continues trying to follow low/ limited sodium diet; "tries to eat as much  fresh food as possible."  Patient  denies further issues, concerns, or problems today. We discussed that patient has successfully met her previously established [place] CM goals, and she denies further care coordination needs; discussed option of my placing a referral to [place] for ongoing reinforcement of self-health management stratgies of CHF; patient is agreeable to my placing this referral today and stated she would appreciate having a nurse available to "help keep on track." Iconfirmed that patient hasmy direct phone number, the main [place] office phone number, and the [facility] CM 24-hour nurse advice phone number should issues arise prior to outreach by [place].  I encouraged patient to actively engage with [place] once outreach is attempted, and patient verbalizes agreement and states she will do.  Plan:  I will close [place] program, as patient has met her previously established goals and denies further care coordination issues, and will make patient's PCP aware of same  [place] referral placed today   [facility] CM Care Plan Problem Two     Most Recent Value  Care Plan Problem Two  Need for reinforcement of self-health management strategies for chronic disease state of CHF, as evidenced by patient reporting  Role Documenting the Problem Two  Care Management Coordinator  Care Plan for Problem Two  Active  [facility] CM Short Term Goal #1   Over the next 30 days, patient will engage with [place] to discuss/ reinforce daily self-health management strategies for CHF, as evidenced by successful engagement with [place] Coach  [facility] CM Short Term Goal #1 Start Date  07/24/18  Interventions for Short Term Goal #2   Confirmed that patient has not further care coordination needs and discussed role of [place] with patient,  encouraged patient to engage with [place] for ongoing reinforcement of self-health  management of chronic disease state of CHF,  placed [place] referral  [facility] CM Short Term Goal #2   Over the next 30 days, patient will attend all provider appointments, and will promptly notify care providers for any new problems, concerns, or issues, as evidenced by patient reporting during [place] CCM outreach  [facility] CM Short Term Goal #2 Start Date  06/22/18  [facility] CM Short Term Goal #2 Met Date  07/24/18 [Goal met]  Interventions for Short Term Goal #2  Discussed with patient recent provider appointments,  newly reported symptom of jitteriness, without other signs/ symptoms CHF exacerbation,  provided positive reinforcement that patient promptly notifed PCP of new symptom,  reviewed upcoming scheduled provider appointments and confirmed that patient will continue transporting herself to provider appointments    [facility] CM Care Plan Problem Three     Most Recent Value  Care Plan Problem Three  Need for creation of Advanced Directives, as evidenced by patient reporting   Role Documenting the Problem Three  Care Management Coordinator  Care Plan for Problem Three  Not Active  [facility] Long Term Goal   Over the next 31 days, patient will review Advanced Directive packet and discuss questions with [facility] RN CCM, as evidenced by patient reporting during [facility] RN CCM outreach  [facility] Long Term Goal Start Date  06/22/18  [facility] Long Term Goal Met Date  07/24/18 [facility] met]  Interventions for Problem Three Long Term Goal  Discussed with patient previously provided education around AD planning,  confirmed that patient has no questions around process, but has not yet completed  AD planning packet- encouraged patient to do so in near future     It has been a pleasure participating in Evangelyne's care,  Oneta Rack, RN, BSN, [facility] Care Management  [phone]

## 2018-07-24 NOTE — Telephone Encounter (Signed)
Left message on vm per dpr that alprazolam was called in for her.

## 2018-07-24 NOTE — Telephone Encounter (Signed)
Please let her know that I sent the prescription

## 2018-07-27 ENCOUNTER — Ambulatory Visit: Payer: PPO | Admitting: *Deleted

## 2018-07-28 ENCOUNTER — Inpatient Hospital Stay: Payer: PPO

## 2018-07-28 ENCOUNTER — Encounter: Payer: Self-pay | Admitting: Oncology

## 2018-07-28 ENCOUNTER — Inpatient Hospital Stay: Payer: PPO | Attending: Oncology | Admitting: Oncology

## 2018-07-28 ENCOUNTER — Other Ambulatory Visit: Payer: Self-pay

## 2018-07-28 ENCOUNTER — Encounter (INDEPENDENT_AMBULATORY_CARE_PROVIDER_SITE_OTHER): Payer: Self-pay

## 2018-07-28 VITALS — BP 135/77 | HR 73 | Temp 98.2°F | Resp 18 | Ht 60.0 in | Wt 144.9 lb

## 2018-07-28 DIAGNOSIS — Z8041 Family history of malignant neoplasm of ovary: Secondary | ICD-10-CM

## 2018-07-28 DIAGNOSIS — Z87891 Personal history of nicotine dependence: Secondary | ICD-10-CM | POA: Diagnosis not present

## 2018-07-28 DIAGNOSIS — Z801 Family history of malignant neoplasm of trachea, bronchus and lung: Secondary | ICD-10-CM

## 2018-07-28 DIAGNOSIS — Z79899 Other long term (current) drug therapy: Secondary | ICD-10-CM | POA: Diagnosis not present

## 2018-07-28 DIAGNOSIS — D638 Anemia in other chronic diseases classified elsewhere: Secondary | ICD-10-CM | POA: Diagnosis not present

## 2018-07-28 DIAGNOSIS — D751 Secondary polycythemia: Secondary | ICD-10-CM | POA: Diagnosis not present

## 2018-07-28 DIAGNOSIS — N184 Chronic kidney disease, stage 4 (severe): Secondary | ICD-10-CM | POA: Diagnosis not present

## 2018-07-28 DIAGNOSIS — Z9981 Dependence on supplemental oxygen: Secondary | ICD-10-CM | POA: Diagnosis not present

## 2018-07-28 DIAGNOSIS — Z808 Family history of malignant neoplasm of other organs or systems: Secondary | ICD-10-CM | POA: Diagnosis not present

## 2018-07-28 DIAGNOSIS — J9610 Chronic respiratory failure, unspecified whether with hypoxia or hypercapnia: Secondary | ICD-10-CM | POA: Insufficient documentation

## 2018-07-28 DIAGNOSIS — E871 Hypo-osmolality and hyponatremia: Secondary | ICD-10-CM

## 2018-07-28 LAB — COMPREHENSIVE METABOLIC PANEL
ALK PHOS: 61 U/L (ref 38–126)
ALT: 14 U/L (ref 0–44)
ANION GAP: 13 (ref 5–15)
AST: 25 U/L (ref 15–41)
Albumin: 4.1 g/dL (ref 3.5–5.0)
BUN: 27 mg/dL — ABNORMAL HIGH (ref 8–23)
CALCIUM: 10 mg/dL (ref 8.9–10.3)
CO2: 32 mmol/L (ref 22–32)
CREATININE: 1.76 mg/dL — AB (ref 0.44–1.00)
Chloride: 94 mmol/L — ABNORMAL LOW (ref 98–111)
GFR, EST AFRICAN AMERICAN: 34 mL/min — AB (ref >60)
GFR, EST NON AFRICAN AMERICAN: 29 mL/min — AB (ref >60)
Glucose, Bld: 181 mg/dL — ABNORMAL HIGH (ref 70–99)
Potassium: 3.7 mmol/L (ref 3.5–5.1)
Sodium: 139 mmol/L (ref 135–145)
TOTAL PROTEIN: 7.6 g/dL (ref 6.5–8.1)
Total Bilirubin: 0.6 mg/dL (ref 0.3–1.2)

## 2018-07-28 LAB — CBC WITH DIFFERENTIAL/PLATELET
BASOS ABS: 0.1 10*3/uL (ref 0–0.1)
Basophils Relative: 1 %
EOS ABS: 0.5 10*3/uL (ref 0–0.7)
EOS PCT: 5 %
HCT: 44.9 % (ref 35.0–47.0)
HEMOGLOBIN: 15.5 g/dL (ref 12.0–16.0)
LYMPHS PCT: 16 %
Lymphs Abs: 1.4 10*3/uL (ref 1.0–3.6)
MCH: 29 pg (ref 26.0–34.0)
MCHC: 34.6 g/dL (ref 32.0–36.0)
MCV: 83.8 fL (ref 80.0–100.0)
Monocytes Absolute: 0.6 10*3/uL (ref 0.2–0.9)
Monocytes Relative: 7 %
NEUTROS PCT: 71 %
Neutro Abs: 6.4 10*3/uL (ref 1.4–6.5)
PLATELETS: 187 10*3/uL (ref 150–440)
RBC: 5.35 MIL/uL — AB (ref 3.80–5.20)
RDW: 14.9 % — ABNORMAL HIGH (ref 11.5–14.5)
WBC: 9 10*3/uL (ref 3.6–11.0)

## 2018-07-28 LAB — IRON AND TIBC
Iron: 135 ug/dL (ref 28–170)
Saturation Ratios: 44 % — ABNORMAL HIGH (ref 10.4–31.8)
TIBC: 310 ug/dL (ref 250–450)
UIBC: 175 ug/dL

## 2018-07-28 LAB — OSMOLALITY, URINE: OSMOLALITY UR: 295 mosm/kg — AB (ref 300–900)

## 2018-07-28 LAB — OSMOLALITY: Osmolality: 304 mOsm/kg — ABNORMAL HIGH (ref 275–295)

## 2018-07-28 LAB — FERRITIN: Ferritin: 78 ng/mL (ref 11–307)

## 2018-07-28 NOTE — Progress Notes (Signed)
Patient here for initial visit. °

## 2018-07-28 NOTE — Progress Notes (Signed)
Hematology/Oncology Consult note [facility] Telephone:([phone] [phone]   Patient Care Team: Venia Carbon, MD as PCP - General Bensimhon, Shaune Pascal, MD as PCP - Cardiology (Cardiology) Bensimhon, Shaune Pascal, MD as Consulting Physician (Cardiology) Juanito Doom, MD as Consulting Physician (Pulmonary Disease) Anthonette Legato, MD as Consulting Physician (Internal Medicine) Lupita Raider, DO as Consulting Physician (Optometry) Pleasant, Eppie Gibson, RN as [place] Management Marcelline Deist, Ranae Pila, LCSW as [place] Management  REFERRING PROVIDER: Dr.Lateef CHIEF COMPLAINTS/REASON FOR VISIT:  Evaluation of hyponatremia and hypotonic  HISTORY OF PRESENTING ILLNESS:  Lindsay Harmon is a  62 y.o.  female with PMH listed below who was referred to me for evaluation of hyponatremia.  Patient has history of chronic kidney disease stage IV, proteinuria, hyponatremia lower extremity edema, secondary hyperparathyroidism and anemia of chronic disease follows up with Dr. Holley Raring.  Extensive medical records review was performed.  Patient has a history of hyponatremia and has been maintained on torsemide and fluid restriction.  Recent serum sodium has been stable. Reviewed patient's previous lab results since 2008 in [place].  She has a history of chronic hyponatremia in 2016 with sodium level running low around 120s.  Hyponatremia was considered to be secondary to volume overload/CHF compounded by Aldactone. Patient was transitioned to Lasix and then later switched to Torsemide  Sodium level appears to improve in 2017 and 2018 with average level around 1 30-1 35.  This year patient's hyponatremia has been well controlled with all readings above 130.  She has chronic respiratory failure and on home oxygen. She reports feeling fatigue, which is chronic for her.   Review of Systems  Constitutional: Positive for  malaise/fatigue. Negative for chills and fever.  HENT: Negative for nosebleeds and sore throat.   Eyes: Negative for double vision, photophobia and redness.  Respiratory: Positive for shortness of breath. Negative for cough, hemoptysis and wheezing.   Cardiovascular: Negative for chest pain, palpitations and orthopnea.  Gastrointestinal: Negative for abdominal pain, blood in stool, nausea and vomiting.  Genitourinary: Negative for dysuria.  Musculoskeletal: Negative for back pain, myalgias and neck pain.  Skin: Negative for itching and rash.  Neurological: Negative for dizziness, tingling and tremors.  Endo/Heme/Allergies: Negative for environmental allergies. Does not bruise/bleed easily.  Psychiatric/Behavioral: Negative for depression.    MEDICAL HISTORY:  Past Medical History:  Diagnosis Date  . Allergy   . Aortic atherosclerosis ([place])   . Arthritis   . CKD (chronic kidney disease), stage IV ([place])   . COPD (chronic obstructive pulmonary disease) ([place])    a. 11/2014 PFT's: FEV1 0.87 (38%), FVC 1.41 (48%), FEF 25-75 0.41 (19%). Unable to perform DLCO; b. 12/2017 Simple spirometry ration 67%. FEV1 1.21 L+53% predicted. FVC 1.79L 61% predicted.  . Coronary artery calcification seen on CT scan    a. 03/2017 CT Chest.  . Depression   . Diabetic neuropathy ([place])   . Esophageal stricture   . Familial hematuria   . GERD (gastroesophageal reflux disease)   . Hyperlipidemia   . Hypertension   . IBS (irritable bowel syndrome)   . Nephrolithiasis   . NIDDM (non-insulin dependent diabetes mellitus)   . Obesity, unspecified   . Pulmonary hypertension ([place])    a. WHO Group 3; b. 10/2016 Echo: EF 55-60%, no rwma, Gr1 DD, mild to mod AS, mean grad 49mHg, mildly dil RV w/ mildly reduced RV fxn, mildly dil RA. PASP 1135mg; c. 12/2017 RHC: RA 11, RV  92/12, PA 92/33(57), PCWP 15, Fick CO/CI 5.9/3.3. PVR 7.2 WU. Ao 93%, PA 62/64%.  Marland Kitchen PVD (peripheral vascular disease) ([place])     SURGICAL  HISTORY: Past Surgical History:  Procedure Laterality Date  . ABDOMINAL HYSTERECTOMY    . CARPAL TUNNEL RELEASE    . CESAREAN SECTION    . ERCP N/A 03/17/2017   Procedure: ENDOSCOPIC RETROGRADE CHOLANGIOPANCREATOGRAPHY (ERCP);  Surgeon: [name], MD;  Location[facility] ENDOSCOPY;  Service: Endoscopy;  Laterality: N/A;  . EUS N/A 03/13/2017   Procedure: FULL UPPER ENDOSCOPIC ULTRASOUND (EUS) RADIAL;  Surgeon: Burbridge, Murray Hodgkins, MD;  Location: [facility] ENDOSCOPY;  Service: Endoscopy;  Laterality: N/A;  . RIGHT HEART CATH N/A 01/22/2018   Procedure: RIGHT HEART CATH;  Surgeon: Jolaine Artist, MD;  Location: [place] CV LAB;  Service: Cardiovascular;  Laterality: N/A;  . RIGHT HEART CATHETERIZATION N/A 12/05/2014   Procedure: RIGHT HEART CATH;  Surgeon: Sinclair Grooms, MD;  Location: [facility] CATH LAB;  Service: Cardiovascular;  Laterality: N/A;    SOCIAL HISTORY: Social History   Socioeconomic History  . Marital status: Legally Separated    Spouse name: Not on file  . Number of children: 1  . Years of education: Not on file  . Highest education level: Not on file  Occupational History  . 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 Financial resource strain: Not on file  . Food insecurity:    Worry: Not on file    Inability: Not on file  . Transportation needs:    Medical: Not on file    Non-medical: Not on file  Tobacco Use  . Smoking status: Former Smoker    Packs/day: 1.50    Years: 33.00    Pack years: 49.50    Types: Cigarettes    Last attempt to quit: 07/28/2005    Years since quitting: 13.0  . Smokeless tobacco: Never Used  Substance and Sexual Activity  . Alcohol use: No    Alcohol/week: 0.0 standard drinks    Comment: heavy in the past  . Drug use: No  . Sexual activity: Never  Lifestyle  . Physical activity:    Days per week: Not on file    Minutes per session: Not on file  . Stress: Not on file  Relationships   . Social connections:    Talks on phone: Not on file    Gets together: Not on file    Attends religious service: Not on file    Active member of club or organization: Not on file    Attends meetings of clubs or organizations: Not on file    Relationship status: Not on file  . Intimate partner violence:    Fear of current or ex partner: Not on file    Emotionally abused: Not on file    Physically abused: Not on file    Forced sexual activity: Not on file  Other Topics Concern  . Not on file  Social History Narrative   No living will   Son [name] (then mom) should make decisions for her if she is unable.    Would accept resuscitation attempts but no prolonged ventilation   Not sure about tube feeds but probably wouldn't want them if cognitively unaware    FAMILY HISTORY: Family History  Problem Relation Age of Onset  . Diabetes Mother   . Cancer Mother        ovarian,melanoma  . Allergies  Father   . Cancer Father        lung  . Cancer Maternal Grandmother        uterine  . Emphysema Paternal Grandfather   . Allergies Sister   . Breast cancer Neg Hx     ALLERGIES:  is allergic to sertraline hcl.  MEDICATIONS:  Current Outpatient Medications  Medication Sig Dispense Refill  . albuterol (VENTOLIN HFA) 108 (90 Base) MCG/ACT inhaler USE 2 PUFFS EVERY SIX HOURS AS NEEDED FOR WHEEZING OR SHORTNESS OF BREATH 18 g 3  . ALPRAZolam (XANAX) 0.25 MG tablet Take 1 tablet (0.25 mg total) by mouth daily as needed for anxiety. 30 tablet 0  . aspirin 325 MG tablet Take 325 mg by mouth at bedtime.     . calcitRIOL (ROCALTROL) 0.5 MCG capsule Take 0.5 mcg by mouth daily.     . cholecalciferol (VITAMIN D) 1000 units tablet Take 1,000 Units by mouth at bedtime.     . ferrous sulfate 325 (65 FE) MG tablet Take 1 tablet (325 mg total) by mouth 2 (two) times daily with a meal. 60 tablet 3  . insulin glargine (LANTUS) 100 UNIT/ML injection Inject 20 Units into the skin daily.    Marland Kitchen loratadine  (CLARITIN) 10 MG tablet Take 10 mg by mouth 2 (two) times daily.    Marland Kitchen lovastatin (MEVACOR) 40 MG tablet Take 2 tablets (80 mg total) by mouth at bedtime. 180 tablet 3  . methadone (DOLOPHINE) 10 MG tablet Take 2 tablets (20 mg total) by mouth 4 (four) times daily. 240 tablet 0  . metoprolol tartrate (LOPRESSOR) 25 MG tablet Take 1/2 tablet by mouth twice a day 90 tablet 3  . nitroGLYCERIN (NITROSTAT) 0.4 MG SL tablet Place 1 tablet (0.4 mg total) under the tongue every 5 (five) minutes as needed for chest pain. 100 tablet 1  . ondansetron (ZOFRAN ODT) 4 MG disintegrating tablet Take 1 tablet (4 mg total) by mouth every 8 (eight) hours as needed for nausea or vomiting. 20 tablet 0  . pantoprazole (PROTONIX) 40 MG tablet Take 1 tablet by mouth twice a day 180 tablet 3  . Tiotropium Bromide-Olodaterol (STIOLTO RESPIMAT) 2.5-2.5 MCG/ACT AERS Inhale 2 puffs into the lungs daily. 4 g 5  . Treprostinil (TYVASO) 0.6 MG/ML SOLN Inhale 18 mcg into the lungs 4 (four) times daily.    Marland Kitchen torsemide (DEMADEX) 20 MG tablet Take 2 tablets (40 mg total) by mouth daily. 180 tablet 3   No current facility-administered medications for this visit.      PHYSICAL EXAMINATION: ECOG PERFORMANCE STATUS: 2 - Symptomatic, <50% confined to bed Vitals:   07/28/18 1004  BP: 135/77  Pulse: 73  Resp: 18  Temp: 98.2 F (36.8 C)  SpO2: 95%   Filed Weights   07/28/18 1004  Weight: 144 lb 14.4 oz (65.7 kg)    Physical Exam  Constitutional: She is oriented to person, place, and time. No distress.  HENT:  Head: Normocephalic and atraumatic.  Mouth/Throat: Oropharynx is clear and moist.  Eyes: Pupils are equal, round, and reactive to light. EOM are normal. No scleral icterus.  Neck: Normal range of motion. Neck supple.  Cardiovascular: Normal rate.  Murmur heard. Pulmonary/Chest: Effort normal. No respiratory distress. She has no wheezes.  Breath comfortably, nasal cannula oxygen.   Abdominal: Soft. Bowel sounds are  normal. She exhibits no distension and no mass. There is no tenderness.  Musculoskeletal: Normal range of motion. She exhibits no edema or deformity.  Neurological: She is alert and oriented to person, place, and time. No cranial nerve deficit. Coordination normal.  Skin: Skin is warm and dry. No rash noted. No erythema.  Psychiatric: She has a normal mood and affect. Her behavior is normal. Thought content normal.     LABORATORY DATA:  I have reviewed the data as listed Lab Results  Component Value Date   WBC 9.0 07/28/2018   HGB 15.5 07/28/2018   HCT 44.9 07/28/2018   MCV 83.8 07/28/2018   PLT 187 07/28/2018   Recent Labs    04/23/18 0619 04/24/18 0441 07/28/18 1054  NA 135 134* 139  K 4.2 4.8 3.7  CL 98 97* 94*  CO2 27 21* 32  GLUCOSE 129* 70 181*  BUN 46* 53* 27*  CREATININE 2.16* 2.37* 1.76*  CALCIUM 8.8* 9.3 10.0  GFRNONAA 23* 21* 29*  GFRAA 27* 24* 34*  PROT 6.5 7.0 7.6  ALBUMIN 3.0* 3.4* 4.1  AST 545* 574* 25  ALT 699* 679* 14  ALKPHOS 86 99 61  BILITOT 1.5* 3.1* 0.6   Iron/TIBC/Ferritin/ %Sat    Component Value Date/Time   IRON 135 07/28/2018 1054   TIBC 310 07/28/2018 1054   FERRITIN 78 07/28/2018 1054   IRONPCTSAT 44 (H) 07/28/2018 1054        ASSESSMENT & PLAN:  1. Hyponatremia   2. CKD (chronic kidney disease) stage 4, GFR 15-29 ml/min (HCC)    Hyponatremia appears to be a chronic issue for patient. Currently her sodium appears in control  Former smoker, extensive smoking history, she is up to date for low dose CT chest, recently done on 04/13/2018  Her hemoglobin is relatively high given consideration of her advance chronic kidney disease. Suspect that this is a secondary erythrocytosis due to chronic hypoxia.  Recommend check CBC, CMP, multiple myeloma panel, serum osmolarity, urine osmolarity, iron ferritin.    Orders Placed This Encounter  Procedures  . CBC with Differential/Platelet    Standing Status:   Future    Number of  Occurrences:   1    Standing Expiration Date:   07/29/2019  . Iron and TIBC    Standing Status:   Future    Number of Occurrences:   1    Standing Expiration Date:   07/29/2019  . Ferritin    Standing Status:   Future    Number of Occurrences:   1    Standing Expiration Date:   07/29/2019  . Osmolality    Standing Status:   Future    Number of Occurrences:   1    Standing Expiration Date:   07/29/2019  . Comprehensive metabolic panel    Standing Status:   Future    Number of Occurrences:   1    Standing Expiration Date:   07/29/2019  . Osmolality, urine    Standing Status:   Future    Number of Occurrences:   1    Standing Expiration Date:   07/29/2019  . Multiple Myeloma Panel (SPEP&IFE w/QIG)    Standing Status:   Future    Number of Occurrences:   1    Standing Expiration Date:   07/28/2019  . Kappa/lambda light chains    Standing Status:   Future    Number of Occurrences:   1    Standing Expiration Date:   07/28/2019    All questions were answered. The patient knows to call the clinic with any problems questions or concerns.  Return of visit:  2 weeks.  Thank you for this kind referral and the opportunity to participate in the care of this patient. A copy of today's note is routed to referring provider  Total face to face encounter time for this patient visit was 45 min. >50% of the time was  spent in counseling and coordination of care.    Earlie Server, MD, PhD Hematology Oncology [facility] Pager- [phone] 07/28/2018

## 2018-07-29 LAB — MULTIPLE MYELOMA PANEL, SERUM
ALBUMIN SERPL ELPH-MCNC: 3.6 g/dL (ref 2.9–4.4)
ALPHA2 GLOB SERPL ELPH-MCNC: 0.8 g/dL (ref 0.4–1.0)
Albumin/Glob SerPl: 1.1 (ref 0.7–1.7)
Alpha 1: 0.2 g/dL (ref 0.0–0.4)
B-Globulin SerPl Elph-Mcnc: 0.9 g/dL (ref 0.7–1.3)
GLOBULIN, TOTAL: 3.3 g/dL (ref 2.2–3.9)
Gamma Glob SerPl Elph-Mcnc: 1.4 g/dL (ref 0.4–1.8)
IGG (IMMUNOGLOBIN G), SERUM: 1307 mg/dL (ref 700–1600)
IgA: 275 mg/dL (ref 87–352)
IgM (Immunoglobulin M), Srm: 127 mg/dL (ref 26–217)
TOTAL PROTEIN ELP: 6.9 g/dL (ref 6.0–8.5)

## 2018-07-29 LAB — KAPPA/LAMBDA LIGHT CHAINS
Kappa free light chain: 73.4 mg/L — ABNORMAL HIGH (ref 3.3–19.4)
Kappa, lambda light chain ratio: 1.55 (ref 0.26–1.65)
Lambda free light chains: 47.4 mg/L — ABNORMAL HIGH (ref 5.7–26.3)

## 2018-08-04 ENCOUNTER — Ambulatory Visit (INDEPENDENT_AMBULATORY_CARE_PROVIDER_SITE_OTHER): Payer: PPO

## 2018-08-04 ENCOUNTER — Other Ambulatory Visit: Payer: Self-pay

## 2018-08-04 DIAGNOSIS — I272 Pulmonary hypertension, unspecified: Secondary | ICD-10-CM

## 2018-08-06 DIAGNOSIS — I509 Heart failure, unspecified: Secondary | ICD-10-CM | POA: Diagnosis not present

## 2018-08-06 DIAGNOSIS — J449 Chronic obstructive pulmonary disease, unspecified: Secondary | ICD-10-CM | POA: Diagnosis not present

## 2018-08-07 ENCOUNTER — Other Ambulatory Visit: Payer: Self-pay | Admitting: Internal Medicine

## 2018-08-10 ENCOUNTER — Telehealth: Payer: Self-pay | Admitting: Internal Medicine

## 2018-08-10 NOTE — Telephone Encounter (Signed)
Copied from [place] [id number]. Topic: Quick Communication - Rx Refill/Question >> Aug 10, 2018 11:11 AM Gardiner Ramus wrote: Medication: gabapentin (NEURONTIN) 300 MG capsule [[id number]] pt would like to be put back on this medication for her burning and cramps in feet and legs.

## 2018-08-10 NOTE — Telephone Encounter (Signed)
Medication no longer on pts med list. Last Rx d/c 03/2018, last OV 07/14/2018. pls advise

## 2018-08-11 ENCOUNTER — Encounter: Payer: Self-pay | Admitting: Oncology

## 2018-08-11 ENCOUNTER — Inpatient Hospital Stay (HOSPITAL_BASED_OUTPATIENT_CLINIC_OR_DEPARTMENT_OTHER): Payer: PPO | Admitting: Oncology

## 2018-08-11 ENCOUNTER — Other Ambulatory Visit: Payer: Self-pay

## 2018-08-11 VITALS — BP 121/81 | HR 71 | Temp 96.5°F | Resp 18 | Wt 146.1 lb

## 2018-08-11 DIAGNOSIS — E871 Hypo-osmolality and hyponatremia: Secondary | ICD-10-CM | POA: Diagnosis not present

## 2018-08-11 DIAGNOSIS — J9610 Chronic respiratory failure, unspecified whether with hypoxia or hypercapnia: Secondary | ICD-10-CM

## 2018-08-11 DIAGNOSIS — D751 Secondary polycythemia: Secondary | ICD-10-CM

## 2018-08-11 DIAGNOSIS — N184 Chronic kidney disease, stage 4 (severe): Secondary | ICD-10-CM | POA: Diagnosis not present

## 2018-08-11 DIAGNOSIS — Z9981 Dependence on supplemental oxygen: Secondary | ICD-10-CM

## 2018-08-11 MED ORDER — GABAPENTIN 300 MG PO CAPS: 300.0000 mg | ORAL_CAPSULE | Freq: Every day | ORAL | 2 refills | Status: DC

## 2018-08-11 NOTE — Telephone Encounter (Signed)
Spoke to pt. She agreed.

## 2018-08-11 NOTE — Telephone Encounter (Signed)
Pt said for the last month or so she has had episodes of her toes cramping up. The right one is worse. She forgot to mention it at her OV recently.

## 2018-08-11 NOTE — Telephone Encounter (Signed)
I would still just have her try it at bedtime and see how she does

## 2018-08-11 NOTE — Telephone Encounter (Signed)
Spoke to pt. Did rx.

## 2018-08-11 NOTE — Telephone Encounter (Signed)
Webb Silversmith is out of the office today.

## 2018-08-11 NOTE — Telephone Encounter (Signed)
Dr Silvio Pate is out of the office until tomorrow. I will see if Webb Silversmith wants to refill or wait for Dr Silvio Pate.

## 2018-08-11 NOTE — Progress Notes (Signed)
Hematology/Oncology follow up note [facility] [phone] [phone]   Patient Care Team: Venia Carbon, MD as PCP - General Bensimhon, Shaune Pascal, MD as PCP - Cardiology (Cardiology) Bensimhon, Shaune Pascal, MD as Consulting Physician (Cardiology) Juanito Doom, MD as Consulting Physician (Pulmonary Disease) Anthonette Legato, MD as Consulting Physician (Internal Medicine) Lupita Raider, DO as Consulting Physician (Optometry) Pleasant, Eppie Gibson, RN as [place] Management  REFERRING PROVIDER: Dr.Lateef REASON FOR VISIT Follow up for treatment of hyponatremia and hypotonic  HISTORY OF PRESENTING ILLNESS:  Lindsay Harmon is a  62 y.o.  female with PMH listed below who was referred to me for evaluation of hyponatremia.  Patient has history of chronic kidney disease stage IV, proteinuria, hyponatremia lower extremity edema, secondary hyperparathyroidism and anemia of chronic disease follows up with Dr. Holley Raring.  Extensive medical records review was performed.  Patient has a history of hyponatremia and has been maintained on torsemide and fluid restriction.  Recent serum sodium has been stable. Reviewed patient's previous lab results since 2008 in [place].  She has a history of chronic hyponatremia in 2016 with sodium level running low around 120s.  Hyponatremia was considered to be secondary to volume overload/CHF compounded by Aldactone. Patient was transitioned to Lasix and then later switched to Torsemide  Sodium level appears to improve in 2017 and 2018 with average level around 1 30-1 35.  This year patient's hyponatremia has been well controlled with all readings above 130.  She has chronic respiratory failure and on home oxygen. She reports feeling fatigue, which is chronic for her.   INTERVAL HISTORY Lindsay Harmon is a 62 y.o. female who has above history reviewed by me today presents for follow up visit for  management of  Problems and complaints are listed below:She has had lab work up done during the interval and presents to discuss results.  Reports feeling well. No new complaints. SOB is at baseline, used home oxygen.   Review of Systems  Constitutional: Positive for malaise/fatigue. Negative for chills, fever and weight loss.  HENT: Negative for nosebleeds and sore throat.   Eyes: Negative for double vision, photophobia and redness.  Respiratory: Positive for shortness of breath. Negative for cough, hemoptysis and wheezing.   Cardiovascular: Negative for chest pain, palpitations and orthopnea.  Gastrointestinal: Negative for abdominal pain, blood in stool, nausea and vomiting.  Genitourinary: Negative for dysuria.  Musculoskeletal: Negative for back pain, myalgias and neck pain.  Skin: Negative for itching and rash.  Neurological: Negative for dizziness, tingling and tremors.  Endo/Heme/Allergies: Negative for environmental allergies. Does not bruise/bleed easily.  Psychiatric/Behavioral: Negative for depression.    MEDICAL HISTORY:  Past Medical History:  Diagnosis Date  . Allergy   . Aortic atherosclerosis ([place])   . Arthritis   . CKD (chronic kidney disease), stage IV ([place])   . COPD (chronic obstructive pulmonary disease) ([place])    a. 11/2014 PFT's: FEV1 0.87 (38%), FVC 1.41 (48%), FEF 25-75 0.41 (19%). Unable to perform DLCO; b. 12/2017 Simple spirometry ration 67%. FEV1 1.21 L+53% predicted. FVC 1.79L 61% predicted.  . Coronary artery calcification seen on CT scan    a. 03/2017 CT Chest.  . Depression   . Diabetic neuropathy ([place])   . Esophageal stricture   . Familial hematuria   . GERD (gastroesophageal reflux disease)   . Hyperlipidemia   . Hypertension   . IBS (irritable bowel syndrome)   . Nephrolithiasis   . NIDDM (  non-insulin dependent diabetes mellitus)   . Obesity, unspecified   . Pulmonary hypertension ([place])    a. WHO Group 3; b. 10/2016 Echo: EF 55-60%, no rwma,  Gr1 DD, mild to mod AS, mean grad 27mHg, mildly dil RV w/ mildly reduced RV fxn, mildly dil RA. PASP 1177mg; c. 12/2017 RHC: RA 11, RV 92/12, PA 92/33(57), PCWP 15, Fick CO/CI 5.9/3.3. PVR 7.2 WU. Ao 93%, PA 62/64%.  . Marland KitchenVD (peripheral vascular disease) ([place]    SURGICAL HISTORY: Past Surgical History:  Procedure Laterality Date  . ABDOMINAL HYSTERECTOMY    . CARPAL TUNNEL RELEASE    . CESAREAN SECTION    . ERCP N/A 03/17/2017   Procedure: ENDOSCOPIC RETROGRADE CHOLANGIOPANCREATOGRAPHY (ERCP);  Surgeon: WoLucilla LameMD;  [facility];  Service: Endoscopy;  Laterality: N/A;  . EUS N/A 03/13/2017   Procedure: FULL UPPER ENDOSCOPIC ULTRASOUND (EUS) RADIAL;  Surgeon: Burbridge, ReMurray HodgkinsMD;  Location[facility];  Service: Endoscopy;  Laterality: N/A;  . RIGHT HEART CATH N/A 01/22/2018   Procedure: RIGHT HEART CATH;  Surgeon: BeJolaine ArtistMD;  [facility];  Service: Cardiovascular;  Laterality: N/A;  . RIGHT HEART CATHETERIZATION N/A 12/05/2014   Procedure: RIGHT HEART CATH;  Surgeon: HeSinclair GroomsMD;  [facility];  Service: Cardiovascular;  Laterality: N/A;    SOCIAL HISTORY: Social History   Socioeconomic History  . Marital status: Legally Separated    Spouse name: Not on file  . Number of children: 1  . Years of education: Not on file  . Highest education level: Not on file  Occupational History  . 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 Financial resource strain: Not on file  . Food insecurity:    Worry: Not on file    Inability: Not on file  . Transportation needs:    Medical: Not on file    Non-medical: Not on file  Tobacco Use  . Smoking status: Former Smoker    Packs/day: 1.50    Years: 33.00    Pack years: 49.50    Types: Cigarettes    Last attempt to quit: 07/28/2005    Years since quitting: 13.0  . Smokeless tobacco: Never Used  Substance and Sexual Activity    . Alcohol use: No    Alcohol/week: 0.0 standard drinks    Comment: heavy in the past  . Drug use: No  . Sexual activity: Never  Lifestyle  . Physical activity:    Days per week: Not on file    Minutes per session: Not on file  . Stress: Not on file  Relationships  . Social connections:    Talks on phone: Not on file    Gets together: Not on file    Attends religious service: Not on file    Active member of club or organization: Not on file    Attends meetings of clubs or organizations: Not on file    Relationship status: Not on file  . Intimate partner violence:    Fear of current or ex partner: Not on file    Emotionally abused: Not on file    Physically abused: Not on file    Forced sexual activity: Not on file  Other Topics Concern  . Not on file  Social History Narrative   No living will   Son JoVonna Kotykthen mom) should make decisions for her if she is unable.  Would accept resuscitation attempts but no prolonged ventilation   Not sure about tube feeds but probably wouldn't want them if cognitively unaware    FAMILY HISTORY: Family History  Problem Relation Age of Onset  . Diabetes Mother   . Cancer Mother        ovarian,melanoma  . Allergies Father   . Cancer Father        lung  . Cancer Maternal Grandmother        uterine  . Emphysema Paternal Grandfather   . Allergies Sister   . Breast cancer Neg Hx     ALLERGIES:  is allergic to sertraline hcl.  MEDICATIONS:  Current Outpatient Medications  Medication Sig Dispense Refill  . albuterol (VENTOLIN HFA) 108 (90 Base) MCG/ACT inhaler USE 2 PUFFS EVERY SIX HOURS AS NEEDED FOR WHEEZING OR SHORTNESS OF BREATH 18 g 3  . ALPRAZolam (XANAX) 0.25 MG tablet Take 1 tablet (0.25 mg total) by mouth daily as needed for anxiety. 30 tablet 0  . aspirin 325 MG tablet Take 325 mg by mouth at bedtime.     . calcitRIOL (ROCALTROL) 0.5 MCG capsule Take 0.5 mcg by mouth daily.     . cholecalciferol (VITAMIN D) 1000 units  tablet Take 1,000 Units by mouth at bedtime.     . ferrous sulfate 325 (65 FE) MG tablet Take 1 tablet (325 mg total) by mouth 2 (two) times daily with a meal. 60 tablet 3  . Insulin Glargine (LANTUS SOLOSTAR) 100 UNIT/ML Solostar Pen Inject 20 Units into the skin at bedtime. 9 pen 5  . Insulin Pen Needle (SURE COMFORT PEN NEEDLES) 31G X 5 MM MISC USE 1 PEN NEEDLE TO INJECT INSULIN 90 each 3  . loratadine (CLARITIN) 10 MG tablet Take 10 mg by mouth 2 (two) times daily.    Marland Kitchen lovastatin (MEVACOR) 40 MG tablet Take 2 tablets (80 mg total) by mouth at bedtime. 180 tablet 3  . methadone (DOLOPHINE) 10 MG tablet Take 2 tablets (20 mg total) by mouth 4 (four) times daily. 240 tablet 0  . metoprolol tartrate (LOPRESSOR) 25 MG tablet Take 1/2 tablet by mouth twice a day 90 tablet 3  . nitroGLYCERIN (NITROSTAT) 0.4 MG SL tablet Place 1 tablet (0.4 mg total) under the tongue every 5 (five) minutes as needed for chest pain. 100 tablet 1  . ondansetron (ZOFRAN ODT) 4 MG disintegrating tablet Take 1 tablet (4 mg total) by mouth every 8 (eight) hours as needed for nausea or vomiting. 20 tablet 0  . ONE TOUCH ULTRA TEST test strip USE 1 STRIP TO TEST BLOOD SUGAR ONCE DAILY 100 each 3  . pantoprazole (PROTONIX) 40 MG tablet Take 1 tablet by mouth twice a day 180 tablet 3  . Tiotropium Bromide-Olodaterol (STIOLTO RESPIMAT) 2.5-2.5 MCG/ACT AERS Inhale 2 puffs into the lungs daily. 4 g 5  . Treprostinil (TYVASO) 0.6 MG/ML SOLN Inhale 18 mcg into the lungs 4 (four) times daily.    Marland Kitchen torsemide (DEMADEX) 20 MG tablet Take 2 tablets (40 mg total) by mouth daily. 180 tablet 3   No current facility-administered medications for this visit.      PHYSICAL EXAMINATION: ECOG PERFORMANCE STATUS: 2 - Symptomatic, <50% confined to bed Vitals:   08/11/18 1040  BP: 121/81  Pulse: 71  Resp: 18  Temp: (!) 96.5 F (35.8 C)  SpO2: 94%   Filed Weights   08/11/18 1040  Weight: 146 lb 1.6 oz (66.3 kg)    Physical Exam  Constitutional: She is oriented to person, place, and time. No distress.  HENT:  Head: Normocephalic and atraumatic.  Mouth/Throat: Oropharynx is clear and moist.  Eyes: Pupils are equal, round, and reactive to light. EOM are normal. No scleral icterus.  Neck: Normal range of motion. Neck supple.  Cardiovascular: Normal rate and regular rhythm.  Murmur heard. Pulmonary/Chest: Effort normal. No respiratory distress. She has no wheezes.  Breath comfortably, nasal cannula oxygen.   Abdominal: Soft. Bowel sounds are normal. She exhibits no distension and no mass. There is no tenderness.  Musculoskeletal: Normal range of motion. She exhibits no edema or deformity.  Neurological: She is alert and oriented to person, place, and time. No cranial nerve deficit. Coordination normal.  Skin: Skin is warm and dry. No rash noted. No erythema.  Psychiatric: She has a normal mood and affect. Her behavior is normal. Thought content normal.     LABORATORY DATA:  I have reviewed the data as listed Lab Results  Component Value Date   WBC 9.0 07/28/2018   HGB 15.5 07/28/2018   HCT 44.9 07/28/2018   MCV 83.8 07/28/2018   PLT 187 07/28/2018   Recent Labs    04/23/18 0619 04/24/18 0441 07/28/18 1054  NA 135 134* 139  K 4.2 4.8 3.7  CL 98 97* 94*  CO2 27 21* 32  GLUCOSE 129* 70 181*  BUN 46* 53* 27*  CREATININE 2.16* 2.37* 1.76*  CALCIUM 8.8* 9.3 10.0  GFRNONAA 23* 21* 29*  GFRAA 27* 24* 34*  PROT 6.5 7.0 7.6  ALBUMIN 3.0* 3.4* 4.1  AST 545* 574* 25  ALT 699* 679* 14  ALKPHOS 86 99 61  BILITOT 1.5* 3.1* 0.6   Iron/TIBC/Ferritin/ %Sat    Component Value Date/Time   IRON 135 07/28/2018 1054   TIBC 310 07/28/2018 1054   FERRITIN 78 07/28/2018 1054   IRONPCTSAT 44 (H) 07/28/2018 1054     RADIOGRAPHIC STUDIES: I have personally reviewed the radiological images as listed and agreed with the findings in the report. CT chest lung cancer screening 04/13/2018  1. Lung-RADS 2, benign  appearance or behavior. Continue annual screening with low-dose chest CT without contrast in 12 months. 2. Age advanced coronary artery atherosclerosis. Recommend assessment of coronary risk factors and consideration of medical Therapy. 3. Aortic atherosclerosis (ICD10-I70.0) and emphysema (ICD10-J43.9). 4. Aortic valvular calcifications. Consider echocardiography to evaluate for valvular dysfunction. 5. Pulmonary artery enlargement suggests pulmonary arterial hypertension.  ASSESSMENT & PLAN:  1. Erythrocytosis   2. Hyponatremia   3. CKD (chronic kidney disease) stage 4, GFR 15-29 ml/min (HCC)    # Lab reviewed and discussed with patient. Sodium appears stable while on diuretics.  I think hyponatremia was due to CHF/volume overload and chronic kidney disease.  He has history of extensive smoking history and CT chest lung cancer screening is up to date, independently reviewed by me and discussed with patient. Discussed with Dr.Lateef  # Hemoglobin is relatively high given her advanced CKD and I suspect this is a secondary erythrocytosis process due to hypoxia or ?erythropoitin secreting tumor, ie kidney cancer. Will obtain US abdomen   Follow up in 4 months.   Orders Placed This Encounter  Procedures  . US Abdomen Complete    Standing Status:   Future    Standing Expiration Date:   08/12/2019    Order Specific Question:   Reason for Exam (SYMPTOM  OR DIAGNOSIS REQUIRED)    Answer:   erythrocytosis, extensive smoking histroy.    Order  Specific Question:   Preferred imaging location?    Answer:   [facility]  . CBC with Differential/Platelet    Standing Status:   Future    Standing Expiration Date:   08/12/2019  . Erythropoietin    Standing Status:   Future    Standing Expiration Date:   08/12/2019    All questions were answered. The patient knows to call the clinic with any problems questions or concerns.  Return of visit: 4 months    Earlie Server, MD, PhD Hematology  Oncology [facility] Pager- [phone] 08/11/2018

## 2018-08-11 NOTE — Telephone Encounter (Signed)
I am okay with refilling but will need to reassess dose since she has been off for a while. May be best to just try at bedtime (348m) and increase to 6057mif needed. I would not recommend starting daytime doses until we see how she does just with nighttime Okay #60 x 2

## 2018-08-11 NOTE — Progress Notes (Signed)
Patient here for follow up. No concerns voiced

## 2018-08-14 ENCOUNTER — Encounter: Payer: Self-pay | Admitting: Pulmonary Disease

## 2018-08-14 ENCOUNTER — Ambulatory Visit: Payer: PPO | Admitting: Pulmonary Disease

## 2018-08-14 VITALS — BP 138/70 | HR 100 | Wt 147.0 lb

## 2018-08-14 DIAGNOSIS — J449 Chronic obstructive pulmonary disease, unspecified: Secondary | ICD-10-CM | POA: Diagnosis not present

## 2018-08-14 DIAGNOSIS — I2781 Cor pulmonale (chronic): Secondary | ICD-10-CM

## 2018-08-14 DIAGNOSIS — I272 Pulmonary hypertension, unspecified: Secondary | ICD-10-CM | POA: Diagnosis not present

## 2018-08-14 DIAGNOSIS — J9611 Chronic respiratory failure with hypoxia: Secondary | ICD-10-CM | POA: Diagnosis not present

## 2018-08-14 NOTE — Progress Notes (Signed)
Subjective:    Patient ID: Lindsay Harmon, female    DOB: Jul 30, 1956, 62 y.o.   MRN: [id number]   Synopsis: Referred by cardiology in 2016 for evaluation of severe COPD and possible obstructive sleep apnea in the setting of pulmonary hypertension. She also has congestive heart failure. A sleep study in 2016 showed no evidence of obstructive sleep apnea but she does have significant nocturnal hypoxemia. Spirometry testing in 2016 confirmed a diagnosis of COPD with severe airflow obstruction FEV1 41% pred.  She smoked cigarettes for many years and she quit smoking in 2013 after smoking 2 ppd for 40 years.  In 2019 she took sildenafil and her oxygenation worsened while hospitalized.  We ultimately started Tyvaso.  HPI Chief Complaint  Patient presents with  . Follow-up    reports breathing is better    Lindsay Harmon feels better. She says that she can do more around the house now without having to stop and catch her breath.  She continues to take the Tyvaso 7 puffs 4 times a day.    She says that she has not been sick since the last visit.    She has not had problems with leg swelling.  She continues to take Stiolto 2 puffs a day.  She does not have any problems with this.  She continues to use her oxygen continuously.  She says when she is at home she uses 4 to 5 L of continuous flow through her home oxygen concentrator.  Occasionally she will leave the house and use her portable oxygen concentrator.  In general she says that she feels much better and her breathing has improved.  She never really feels too short of breath when she is outside the house.  Today she says that she did push herself a little more than usual by leaving without her walker.   Past Medical History:  Diagnosis Date  . Allergy   . Aortic atherosclerosis ([place])   . Arthritis   . CKD (chronic kidney disease), stage IV ([place])   . COPD (chronic obstructive pulmonary disease) ([place])    a. 11/2014 PFT's: FEV1 0.87 (38%), FVC 1.41  (48%), FEF 25-75 0.41 (19%). Unable to perform DLCO; b. 12/2017 Simple spirometry ration 67%. FEV1 1.21 L+53% predicted. FVC 1.79L 61% predicted.  . Coronary artery calcification seen on CT scan    a. 03/2017 CT Chest.  . Depression   . Diabetic neuropathy ([place])   . Esophageal stricture   . Familial hematuria   . GERD (gastroesophageal reflux disease)   . Hyperlipidemia   . Hypertension   . IBS (irritable bowel syndrome)   . Nephrolithiasis   . NIDDM (non-insulin dependent diabetes mellitus)   . Obesity, unspecified   . Pulmonary hypertension ([place])    a. WHO Group 3; b. 10/2016 Echo: EF 55-60%, no rwma, Gr1 DD, mild to mod AS, mean grad 19mHg, mildly dil RV w/ mildly reduced RV fxn, mildly dil RA. PASP 1167mg; c. 12/2017 RHC: RA 11, RV 92/12, PA 92/33(57), PCWP 15, Fick CO/CI 5.9/3.3. PVR 7.2 WU. Ao 93%, PA 62/64%.  . Marland KitchenVD (peripheral vascular disease) ([place]      Review of Systems  Constitutional: Positive for fatigue. Negative for diaphoresis and fever.  HENT: Negative for postnasal drip, rhinorrhea and sinus pressure.   Respiratory: Negative for cough, shortness of breath and wheezing.   Cardiovascular: Negative for chest pain, palpitations and leg swelling.       Objective:   Physical Exam  Vitals:  08/14/18 1025  BP: 138/70  Pulse: 100  SpO2: 90%  Weight: 147 lb (66.7 kg)   5L [place] pulse (dropped to the 70's while walking on this)  Gen: chronically ill appearing HENT: OP clear, TM's clear, neck supple PULM: Crackles bases B, normal percussion CV: RRR, slight systolic murmur noted, some cyanosis noted feet GI: BS+, soft, nontender Derm: no rash Psyche: normal mood and affect     CBC    Component Value Date/Time   WBC 9.0 07/28/2018 1054   RBC 5.35 (H) 07/28/2018 1054   HGB 15.5 07/28/2018 1054   HCT 44.9 07/28/2018 1054   PLT 187 07/28/2018 1054   MCV 83.8 07/28/2018 1054   MCH 29.0 07/28/2018 1054   MCHC 34.6 07/28/2018 1054   RDW 14.9 (H) 07/28/2018  1054   LYMPHSABS 1.4 07/28/2018 1054   MONOABS 0.6 07/28/2018 1054   EOSABS 0.5 07/28/2018 1054   BASOSABS 0.1 07/28/2018 1054   Echo: December 2018 echocardiogram showed a normal LVEF RV was mildly dilated with an RVSP greater than 100 June 2019 transthoracic echocardiogram normal LVEF, RVSP estimate 115 mmHg October 2019 transthoracic echocardiogram normal LVEF RVSP estimate 90 mmHg  RHC 12/05/2014 RA 12 mmHg (mean) with O2 sat 72%; RV 97/16 mmHg with O2 sat 73%; PA 97/30 mmHg with O2 sat 67%; PCWP(mean) 14 mmHg (mean); Cardiac Output 5.78 L/min Right heart catheterization March 2019: PA pressure 92/33, mean 57, pulmonary vascular resistance 7.2, cardiac output  5.9, pulmonary capillary wedge pressure 15.  Chest imaging: CT scan chest June 2018 images independently reviewed showing mild to moderate centrilobular emphysema in the upper lobes, some mosaicism in the bases. Feb 2016 V/Q IMPRESSION: No evidence of pulmonary embolism.   Pulmonary function testing: March 2019 simple spirometry ratio 67%, FEV1 1.21 L 53% predicted, FVC 1.79 L 61% predicted  Records from her recent visit with her primary care physician reviewed in July 2019 where she was seen for her COPD and other conditions.  Management was continued.     Assessment & Plan:   Pulmonary hypertension (HCC)  Chronic obstructive pulmonary disease, unspecified COPD type ([place])  Cor pulmonale, chronic (HCC)  Chronic respiratory failure with hypoxia ([place])  Discussion: This has been a stable interval for [facility].  In general her symptoms have actually improved significantly since we have started Tyvaso.  She was initially intolerant of 8 puffs 4 times a day though I worry that we may have escalated the dose too quickly.  She has proven that she is intolerant of oral systemic pulmonary vasodilators but she seems to do quite well with inhaled.  I think she still has room to benefit from the inhaled Tyvaso.  Her COPD has been  stable.  She has alarmingly low oxygenation when she walks on a pulse concentrator.  However, she says that she only does this quite rarely, for just a minutes during the course of a week.  She also feels that if she were to switch to tanks it would significantly burden her and limit her lifestyle.  Plan: Chronic respiratory failure with hypoxemia: Continue to use 4 to 5 L of oxygen continuously when you exert yourself In regards to the pulse concentrator, I think it is reasonable to continue using this for now however if you start to feel short of breath while walking with it please let me know because we would need to switch to tanks to provide continuous flow  Pulmonary arterial hypertension: Increase Tyvaso dosing slowly over the next 8  weeks: Start taking 8 puffs with the morning dose, while the rest of the dose throughout the day should be 7 puffs.  Do this for a week then take 8 puffs with the first 2 doses of the day followed by 7 puffs for the last 2 doses of the day.  After a week escalate to 8 puffs for the first 3 doses of the day followed by 7 puffs.  Increase this and this pattern over the next 8 weeks until you are taking 9 puffs 4 times a day. If you have worsening symptoms like before (headache, muscle aches) then decrease the dose back down to 7 puffs 4 times a day and let me know.  Severe COPD: Continue taking Stiolto 2 puffs daily Continue albuterol as needed for chest tightness wheezing or shortness of breath I am glad we already gave you a flu shot Practice good hand hygiene  We will see you back in 8 weeks or sooner if needed   Current Outpatient Medications:  .  albuterol (VENTOLIN HFA) 108 (90 Base) MCG/ACT inhaler, USE 2 PUFFS EVERY SIX HOURS AS NEEDED FOR WHEEZING OR SHORTNESS OF BREATH, Disp: 18 g, Rfl: 3 .  ALPRAZolam (XANAX) 0.25 MG tablet, Take 1 tablet (0.25 mg total) by mouth daily as needed for anxiety., Disp: 30 tablet, Rfl: 0 .  aspirin 325 MG tablet, Take  325 mg by mouth at bedtime. , Disp: , Rfl:  .  calcitRIOL (ROCALTROL) 0.5 MCG capsule, Take 0.5 mcg by mouth daily. , Disp: , Rfl:  .  cholecalciferol (VITAMIN D) 1000 units tablet, Take 1,000 Units by mouth at bedtime. , Disp: , Rfl:  .  ferrous sulfate 325 (65 FE) MG tablet, Take 1 tablet (325 mg total) by mouth 2 (two) times daily with a meal., Disp: 60 tablet, Rfl: 3 .  gabapentin (NEURONTIN) 300 MG capsule, Take 1 capsule (300 mg total) by mouth at bedtime. Increase to 2 capsules at bedtime if needed, Disp: 60 capsule, Rfl: 2 .  Insulin Glargine (LANTUS SOLOSTAR) 100 UNIT/ML Solostar Pen, Inject 20 Units into the skin at bedtime., Disp: 9 pen, Rfl: 5 .  Insulin Pen Needle (SURE COMFORT PEN NEEDLES) 31G X 5 MM MISC, USE 1 PEN NEEDLE TO INJECT INSULIN, Disp: 90 each, Rfl: 3 .  loratadine (CLARITIN) 10 MG tablet, Take 10 mg by mouth 2 (two) times daily., Disp: , Rfl:  .  lovastatin (MEVACOR) 40 MG tablet, Take 2 tablets (80 mg total) by mouth at bedtime., Disp: 180 tablet, Rfl: 3 .  methadone (DOLOPHINE) 10 MG tablet, Take 2 tablets (20 mg total) by mouth 4 (four) times daily., Disp: 240 tablet, Rfl: 0 .  metoprolol tartrate (LOPRESSOR) 25 MG tablet, Take 1/2 tablet by mouth twice a day, Disp: 90 tablet, Rfl: 3 .  nitroGLYCERIN (NITROSTAT) 0.4 MG SL tablet, Place 1 tablet (0.4 mg total) under the tongue every 5 (five) minutes as needed for chest pain., Disp: 100 tablet, Rfl: 1 .  ondansetron (ZOFRAN ODT) 4 MG disintegrating tablet, Take 1 tablet (4 mg total) by mouth every 8 (eight) hours as needed for nausea or vomiting., Disp: 20 tablet, Rfl: 0 .  ONE TOUCH ULTRA TEST test strip, USE 1 STRIP TO TEST BLOOD SUGAR ONCE DAILY, Disp: 100 each, Rfl: 3 .  pantoprazole (PROTONIX) 40 MG tablet, Take 1 tablet by mouth twice a day, Disp: 180 tablet, Rfl: 3 .  Tiotropium Bromide-Olodaterol (STIOLTO RESPIMAT) 2.5-2.5 MCG/ACT AERS, Inhale 2 puffs into the lungs  daily., Disp: 4 g, Rfl: 5 .  Treprostinil  (TYVASO) 0.6 MG/ML SOLN, Inhale 18 mcg into the lungs 4 (four) times daily., Disp: , Rfl:  .  torsemide (DEMADEX) 20 MG tablet, Take 2 tablets (40 mg total) by mouth daily., Disp: 180 tablet, Rfl: 3

## 2018-08-14 NOTE — Patient Instructions (Signed)
Chronic respiratory failure with hypoxemia: Continue to use 4 to 5 L of oxygen continuously when you exert yourself In regards to the pulse concentrator, I think it is reasonable to continue using this for now however if you start to feel short of breath while walking with it please let me know because we would need to switch to tanks to provide continuous flow  Pulmonary arterial hypertension: Increase Tyvaso dosing slowly over the next 8 weeks: Start taking 8 puffs with the morning dose, while the rest of the dose throughout the day should be 7 puffs.  Do this for a week then take 8 puffs with the first 2 doses of the day followed by 7 puffs for the last 2 doses of the day.  After a week escalate to 8 puffs for the first 3 doses of the day followed by 7 puffs.  Increase this and this pattern over the next 8 weeks until you are taking 9 puffs 4 times a day. If you have worsening symptoms like before (headache, muscle aches) then decrease the dose back down to 7 puffs 4 times a day and let me know.  Severe COPD: Continue taking Stiolto 2 puffs daily Continue albuterol as needed for chest tightness wheezing or shortness of breath I am glad we already gave you a flu shot Practice good hand hygiene  We will see you back in 8 weeks or sooner if needed

## 2018-08-19 ENCOUNTER — Ambulatory Visit
Admission: RE | Admit: 2018-08-19 | Discharge: 2018-08-19 | Disposition: A | Discharge status: Home / Self Care | Payer: PPO | Source: Ambulatory Visit | Attending: Oncology | Admitting: Oncology

## 2018-08-19 DIAGNOSIS — K8020 Calculus of gallbladder without cholecystitis without obstruction: Secondary | ICD-10-CM | POA: Insufficient documentation

## 2018-08-19 DIAGNOSIS — N189 Chronic kidney disease, unspecified: Secondary | ICD-10-CM | POA: Diagnosis not present

## 2018-08-19 DIAGNOSIS — N2889 Other specified disorders of kidney and ureter: Secondary | ICD-10-CM | POA: Diagnosis not present

## 2018-08-19 DIAGNOSIS — E871 Hypo-osmolality and hyponatremia: Secondary | ICD-10-CM | POA: Diagnosis not present

## 2018-08-19 DIAGNOSIS — N271 Small kidney, bilateral: Secondary | ICD-10-CM | POA: Diagnosis not present

## 2018-08-19 DIAGNOSIS — N184 Chronic kidney disease, stage 4 (severe): Secondary | ICD-10-CM | POA: Diagnosis not present

## 2018-08-19 DIAGNOSIS — D751 Secondary polycythemia: Secondary | ICD-10-CM | POA: Diagnosis not present

## 2018-08-20 ENCOUNTER — Ambulatory Visit: Payer: Self-pay | Admitting: *Deleted

## 2018-08-25 DIAGNOSIS — H2513 Age-related nuclear cataract, bilateral: Secondary | ICD-10-CM | POA: Diagnosis not present

## 2018-08-25 LAB — HM DIABETES EYE EXAM

## 2018-08-26 ENCOUNTER — Encounter: Payer: Self-pay | Admitting: Internal Medicine

## 2018-08-26 NOTE — Telephone Encounter (Signed)
Pt called back with update;pt said gabapentin 300 mg taking one cap at night did not help the foot pain, burning, or cramping. On 08/23/18 pt started Gabapentin 300 mg taking two caps at bedtime and that has helped the pain, burning and cramping in pts foot. Pt was to cb if increased Gabapentin. FYI to Dr Silvio Pate.

## 2018-08-27 NOTE — Telephone Encounter (Signed)
Let her know that I think it is fine for her to continue the 613m nightly

## 2018-08-27 NOTE — Telephone Encounter (Signed)
Left message on VM for pt per [facility]

## 2018-08-28 ENCOUNTER — Other Ambulatory Visit: Payer: Self-pay | Admitting: Internal Medicine

## 2018-08-31 NOTE — Telephone Encounter (Signed)
Name of Medication: Methadone Name of [facility]acy: [facility] Last Fill or Written Date and Quantity: 07-14-18 #240 Last Office Visit and Type: 07-14-18 3 Month Follow-up Next Office Visit and Type: 11-17-18 # Month Follow-up Last Controlled Substance Agreement Date: 07-14-18 Last UDS: 07-14-18

## 2018-09-06 DIAGNOSIS — I509 Heart failure, unspecified: Secondary | ICD-10-CM | POA: Diagnosis not present

## 2018-09-06 DIAGNOSIS — J449 Chronic obstructive pulmonary disease, unspecified: Secondary | ICD-10-CM | POA: Diagnosis not present

## 2018-09-10 ENCOUNTER — Other Ambulatory Visit: Payer: Self-pay | Admitting: Internal Medicine

## 2018-09-18 ENCOUNTER — Encounter: Payer: Self-pay | Admitting: Internal Medicine

## 2018-09-18 ENCOUNTER — Ambulatory Visit (INDEPENDENT_AMBULATORY_CARE_PROVIDER_SITE_OTHER): Payer: PPO | Admitting: Internal Medicine

## 2018-09-18 VITALS — BP 122/76 | HR 89 | Temp 98.1°F | Resp 18 | Ht 60.0 in | Wt 149.0 lb

## 2018-09-18 DIAGNOSIS — F1120 Opioid dependence, uncomplicated: Secondary | ICD-10-CM

## 2018-09-18 DIAGNOSIS — I25119 Atherosclerotic heart disease of native coronary artery with unspecified angina pectoris: Secondary | ICD-10-CM

## 2018-09-18 DIAGNOSIS — Z7189 Other specified counseling: Secondary | ICD-10-CM | POA: Diagnosis not present

## 2018-09-18 DIAGNOSIS — E1151 Type 2 diabetes mellitus with diabetic peripheral angiopathy without gangrene: Secondary | ICD-10-CM | POA: Diagnosis not present

## 2018-09-18 DIAGNOSIS — J449 Chronic obstructive pulmonary disease, unspecified: Secondary | ICD-10-CM

## 2018-09-18 DIAGNOSIS — I272 Pulmonary hypertension, unspecified: Secondary | ICD-10-CM

## 2018-09-18 DIAGNOSIS — E1165 Type 2 diabetes mellitus with hyperglycemia: Secondary | ICD-10-CM

## 2018-09-18 DIAGNOSIS — Z0000 Encounter for general adult medical examination without abnormal findings: Secondary | ICD-10-CM

## 2018-09-18 DIAGNOSIS — IMO0002 Reserved for concepts with insufficient information to code with codable children: Secondary | ICD-10-CM

## 2018-09-18 LAB — HM DIABETES FOOT EXAM

## 2018-09-18 MED ORDER — HYDROCORTISONE 2.5 % EX CREA: TOPICAL_CREAM | Freq: Three times a day (TID) | CUTANEOUS | 3 refills | Status: DC | PRN

## 2018-09-18 MED ORDER — VALACYCLOVIR HCL 1 G PO TABS: 2000.0000 mg | ORAL_TABLET | Freq: Once | ORAL | 3 refills | Status: AC

## 2018-09-18 NOTE — Progress Notes (Signed)
Subjective:    Patient ID: Lindsay Harmon, female    DOB: 12-19-1955, 62 y.o.   MRN: [id number]  HPI Here for Medicare wellness visit and follow up of chronic health conditions Reviewed form and advanced directives Reviewed other doctors No alcohol or tobacco Not able to exercise Vision and hearing are fine No falls No regular depression or anhedonia Independent with instrumental ADLs Mild memory lapses--- nothing worrisome  She is still doing fairly well tyvaso continues to control her symptoms  Has noted some bumps in the folds of her labia Seems to have more at times and be itchy They are constant--she is concerned about genital warts  Gets some bouts with fever blisters Used valtrex in past  Sugars have been good Checking daily--- 100-168 Foot pain better since restarting gabapentin  No recent chest pain No recent nitro use Weighing daily--stable DOE if she pushes it---still does her housework  Chronic back pain is stable Continues on the pain medication  Current Outpatient Medications on File Prior to Visit  Medication Sig Dispense Refill  . albuterol (VENTOLIN HFA) 108 (90 Base) MCG/ACT inhaler USE 2 PUFFS EVERY SIX HOURS AS NEEDED FOR WHEEZING OR SHORTNESS OF BREATH 18 g 3  . ALPRAZolam (XANAX) 0.25 MG tablet Take 1 tablet (0.25 mg total) by mouth daily as needed for anxiety. 30 tablet 0  . aspirin 325 MG tablet Take 325 mg by mouth at bedtime.     . calcitRIOL (ROCALTROL) 0.5 MCG capsule Take 0.5 mcg by mouth daily.     . cholecalciferol (VITAMIN D) 1000 units tablet Take 1,000 Units by mouth at bedtime.     . ferrous sulfate 325 (65 FE) MG tablet Take 1 tablet (325 mg total) by mouth 2 (two) times daily with a meal. 60 tablet 3  . gabapentin (NEURONTIN) 300 MG capsule Take 1 capsule (300 mg total) by mouth at bedtime. Increase to 2 capsules at bedtime if needed 60 capsule 2  . Insulin Glargine (LANTUS SOLOSTAR) 100 UNIT/ML Solostar Pen Inject 20 Units into  the skin at bedtime. 9 pen 5  . Insulin Pen Needle (SURE COMFORT PEN NEEDLES) 31G X 5 MM MISC USE 1 PEN NEEDLE TO INJECT INSULIN 90 each 3  . loratadine (CLARITIN) 10 MG tablet Take 10 mg by mouth 2 (two) times daily.    Marland Kitchen lovastatin (MEVACOR) 40 MG tablet Take 2 tablets (80 mg total) by mouth at bedtime. 180 tablet 3  . methadone (DOLOPHINE) 10 MG tablet Take 2 tablets (20 mg total) by mouth 4 (four) times daily. 240 tablet 0  . metoprolol tartrate (LOPRESSOR) 25 MG tablet Take 1/2 tablet by mouth twice a day 90 tablet 3  . nitroGLYCERIN (NITROSTAT) 0.4 MG SL tablet Place 1 tablet (0.4 mg total) under the tongue every 5 (five) minutes as needed for chest pain. 100 tablet 1  . ondansetron (ZOFRAN ODT) 4 MG disintegrating tablet Take 1 tablet (4 mg total) by mouth every 8 (eight) hours as needed for nausea or vomiting. 20 tablet 0  . ONE TOUCH ULTRA TEST test strip USE 1 STRIP TO TEST BLOOD SUGAR ONCE DAILY 100 each 3  . pantoprazole (PROTONIX) 40 MG tablet Take 1 tablet by mouth twice a day 180 tablet 3  . Tiotropium Bromide-Olodaterol (STIOLTO RESPIMAT) 2.5-2.5 MCG/ACT AERS Inhale 2 puffs into the lungs daily. 4 g 5  . Treprostinil (TYVASO) 0.6 MG/ML SOLN Inhale 18 mcg into the lungs 4 (four) times daily.    Marland Kitchen  torsemide (DEMADEX) 20 MG tablet Take 2 tablets (40 mg total) by mouth daily. 180 tablet 3   No current facility-administered medications on file prior to visit.     Allergies  Allergen Reactions  . Sertraline Hcl Other (See Comments)    Cloudy thoughts, malaise    Past Medical History:  Diagnosis Date  . Allergy   . Aortic atherosclerosis ([place])   . Arthritis   . CKD (chronic kidney disease), stage IV ([place])   . COPD (chronic obstructive pulmonary disease) ([place])    a. 11/2014 PFT's: FEV1 0.87 (38%), FVC 1.41 (48%), FEF 25-75 0.41 (19%). Unable to perform DLCO; b. 12/2017 Simple spirometry ration 67%. FEV1 1.21 L+53% predicted. FVC 1.79L 61% predicted.  . Coronary artery  calcification seen on CT scan    a. 03/2017 CT Chest.  . Depression   . Diabetic neuropathy ([place])   . Esophageal stricture   . Familial hematuria   . GERD (gastroesophageal reflux disease)   . Hyperlipidemia   . Hypertension   . IBS (irritable bowel syndrome)   . Nephrolithiasis   . NIDDM (non-insulin dependent diabetes mellitus)   . Obesity, unspecified   . Pulmonary hypertension ([place])    a. WHO Group 3; b. 10/2016 Echo: EF 55-60%, no rwma, Gr1 DD, mild to mod AS, mean grad 47mHg, mildly dil RV w/ mildly reduced RV fxn, mildly dil RA. PASP 1127mg; c. 12/2017 RHC: RA 11, RV 92/12, PA 92/33(57), PCWP 15, Fick CO/CI 5.9/3.3. PVR 7.2 WU. Ao 93%, PA 62/64%.  . Marland KitchenVD (peripheral vascular disease) ([facility]    Past Surgical History:  Procedure Laterality Date  . ABDOMINAL HYSTERECTOMY    . CARPAL TUNNEL RELEASE    . CESAREAN SECTION    . ERCP N/A 03/17/2017   Procedure: ENDOSCOPIC RETROGRADE CHOLANGIOPANCREATOGRAPHY (ERCP);  Surgeon: WoLucilla LameMD;  [facility];  Service: Endoscopy;  Laterality: N/A;  . EUS N/A 03/13/2017   Procedure: FULL UPPER ENDOSCOPIC ULTRASOUND (EUS) RADIAL;  Surgeon: Burbridge, ReMurray HodgkinsMD;  Location: [facility];  Service: Endoscopy;  Laterality: N/A;  . RIGHT HEART CATH N/A 01/22/2018   Procedure: RIGHT HEART CATH;  Surgeon: BeJolaine ArtistMD;  [facility];  Service: Cardiovascular;  Laterality: N/A;  . RIGHT HEART CATHETERIZATION N/A 12/05/2014   Procedure: RIGHT HEART CATH;  Surgeon: HeSinclair GroomsMD;  [facility];  Service: Cardiovascular;  Laterality: N/A;    Family History  Problem Relation Age of Onset  . Diabetes Mother   . Cancer Mother        ovarian,melanoma  . Allergies Father   . Cancer Father        lung  . Cancer Maternal Grandmother        uterine  . Emphysema Paternal Grandfather   . Allergies Sister   . Breast cancer Neg Hx     Social History   Socioeconomic History  . Marital  status: Legally Separated    Spouse name: Not on file  . Number of children: 1  . Years of education: Not on file  . Highest education level: Not on file  Occupational History  . 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 Financial resource strain: Not on file  . Food insecurity:    Worry: Not on file    Inability: Not on file  . Transportation needs:    Medical: Not on file  Non-medical: Not on file  Tobacco Use  . Smoking status: Former Smoker    Packs/day: 1.50    Years: 33.00    Pack years: 49.50    Types: Cigarettes    Last attempt to quit: 07/28/2005    Years since quitting: 13.1  . Smokeless tobacco: Never Used  Substance and Sexual Activity  . Alcohol use: No    Alcohol/week: 0.0 standard drinks    Comment: heavy in the past  . Drug use: No  . Sexual activity: Never  Lifestyle  . Physical activity:    Days per week: Not on file    Minutes per session: Not on file  . Stress: Not on file  Relationships  . Social connections:    Talks on phone: Not on file    Gets together: Not on file    Attends religious service: Not on file    Active member of club or organization: Not on file    Attends meetings of clubs or organizations: Not on file    Relationship status: Not on file  . Intimate partner violence:    Fear of current or ex partner: Not on file    Emotionally abused: Not on file    Physically abused: Not on file    Forced sexual activity: Not on file  Other Topics Concern  . Not on file  Social History Narrative   No living will   Son [name] (then mom) should make decisions for her if she is unable.    Would accept resuscitation attempts but no prolonged ventilation   Not sure about tube feeds but probably wouldn't want them if cognitively unaware   Review of Systems Teeth that are left okay---sees dentist No dermatologist--just dry skin, nothing suspicious Appetite is good Weight is stable No  heartburn on the pantoprazole. No swallowing problems Bowels fine---no blood Voids okay. Generally continent---may have some leakage if she waits too long    Objective:   Physical Exam  Constitutional: She is oriented to person, place, and time. She appears well-developed. No distress.  HENT:  Mouth/Throat: Oropharynx is clear and moist. No oropharyngeal exudate.  Neck: No thyromegaly present.  Cardiovascular: Normal rate and regular rhythm.  Gr 3/6 systolic murmur Feet purplish/cool and no pulses  Respiratory: Effort normal. No respiratory distress. She has no wheezes. She has no rales.  Decreased breath sounds but clear  GI: Soft. There is no tenderness.  Genitourinary:  Genitourinary Comments: Dry areas in folds with cracking No clear warts  Musculoskeletal: She exhibits no edema or tenderness.  Lymphadenopathy:    She has no cervical adenopathy.  Neurological: She is alert and oriented to person, place, and time.  President---"Donald Trump, Obama, ?" [id number] Recall 3/3  Decreased sensation in feet  Skin:  No foot lesions  Psychiatric: She has a normal mood and affect. Her behavior is normal.           Assessment & Plan:

## 2018-09-18 NOTE — Assessment & Plan Note (Signed)
I have personally reviewed the Medicare Annual Wellness questionnaire and have noted 1. The patient's medical and social history 2. Their use of alcohol, tobacco or illicit drugs 3. Their current medications and supplements 4. The patient's functional ability including ADL's, fall risks, home safety risks and hearing or visual             impairment. 5. Diet and physical activities 6. Evidence for depression or mood disorders  The patients weight, height, BMI and visual acuity have been recorded in the chart I have made referrals, counseling and provided education to the patient based review of the above and I have provided the pt with a written personalized care plan for preventive services.  I have provided you with a copy of your personalized plan for preventive services. Please take the time to review along with your updated medication list.  Had flu vaccine Discussed cancer screening--will hold off since not likely a surgical candidate even if something was found Unable to exercise

## 2018-09-18 NOTE — Assessment & Plan Note (Signed)
Lab Results  Component Value Date   HGBA1C 7.5 (A) 07/14/2018   Control better now No changes needed Neuropathy better back on gabapentin

## 2018-09-18 NOTE — Assessment & Plan Note (Signed)
No recent angina No changes in Rx needed

## 2018-09-18 NOTE — Assessment & Plan Note (Signed)
CSRS reviewed No concerns

## 2018-09-18 NOTE — Assessment & Plan Note (Signed)
See social history 

## 2018-09-18 NOTE — Assessment & Plan Note (Signed)
Stable respiratory status

## 2018-09-18 NOTE — Assessment & Plan Note (Signed)
Markedly better with the tyvaso

## 2018-09-23 ENCOUNTER — Ambulatory Visit: Payer: PPO | Admitting: Primary Care

## 2018-09-23 ENCOUNTER — Ambulatory Visit: Payer: PPO | Admitting: Pulmonary Disease

## 2018-09-29 ENCOUNTER — Encounter: Payer: Self-pay | Admitting: Primary Care

## 2018-09-29 ENCOUNTER — Ambulatory Visit (INDEPENDENT_AMBULATORY_CARE_PROVIDER_SITE_OTHER): Payer: PPO | Admitting: Primary Care

## 2018-09-29 VITALS — BP 112/78 | HR 89 | Temp 98.2°F | Ht 60.0 in | Wt 150.0 lb

## 2018-09-29 DIAGNOSIS — I272 Pulmonary hypertension, unspecified: Secondary | ICD-10-CM

## 2018-09-29 DIAGNOSIS — J9611 Chronic respiratory failure with hypoxia: Secondary | ICD-10-CM

## 2018-09-29 DIAGNOSIS — J449 Chronic obstructive pulmonary disease, unspecified: Secondary | ICD-10-CM

## 2018-09-29 MED ORDER — ALBUTEROL SULFATE (2.5 MG/3ML) 0.083% IN NEBU: 2.5000 mg | INHALATION_SOLUTION | Freq: Four times a day (QID) | RESPIRATORY_TRACT | 12 refills | Status: DC | PRN

## 2018-09-29 NOTE — Assessment & Plan Note (Signed)
-  Stable; feels a great deal better with addition of Tyvaso - Currently on 7 puffs QID, reported increased wheezing with recent trial increase - Discussed with Dr. Lake Bells, plan is to pre-treat with Albuterol nebulizer prior to increased dose of Tyvaso  - Goal is to slowly titrate dose up to 9 puffs QID over 8 weeks, if patient is unable to tolerate her baseline dose may just have to be 7 puffs QID - Follow up in 8 weeks

## 2018-09-29 NOTE — Assessment & Plan Note (Signed)
-  Stable; continues with home oxygen 4-5 L - POC when out  - No reports of oxygen desaturation

## 2018-09-29 NOTE — Progress Notes (Signed)
_0  ID: Lindsay Harmon, female    DOB: 25-Feb-1956, 62 y.o.   MRN: [id number]  Chief Complaint  Patient presents with  . Follow-up    SOB in am, little tan color mucus in am with minimal cough, little wheezing for 1 week    Referring provider: Venia Carbon, MD   Synopsis: Referred by cardiology in 2016 for evaluation of severe COPD and possible obstructive sleep apnea in the setting of pulmonary hypertension. She also has congestive heart failure. A sleep study in 2016 showed no evidence of obstructive sleep apnea but she does have significant nocturnal hypoxemia. Spirometry testing in 2016 confirmed a diagnosis of COPD with severe airflow obstruction FEV1 41% pred.  She smoked cigarettes for many years and she quit smoking in 2013 after smoking 2 ppd for 40 years. In 2019 she took sildenafil and her oxygenation worsened while hospitalized.  We ultimately started Tyvaso.  HPI: 62 year old female, former smoker quit in 2006.  Past medical history significant for COPD, Neri hypertension, chronic respiratory failure with hypoxia, acute on chronic right heart failure, aortic arthrosclerosis, diabetes type 2, hypertension, right ventricular failure, chronic renal disease stage IV.  Dr. Lake Bells, pain on October 18.  Previously intolerant of oral systemic pulmonary vasodilators.  Appears stable on Tyvaso 7 puffs QID for PAH and Stiolto 2 puffs daily. Wears continuous home oxygen 4-5 liters.   09/29/2018 Patient presents today for 8 week follow-up visit. Dr. Lake Bells has been slowly increasing dose of Tyvaso to a goal of 9 puffs 4 times daily. Instructed if patient developed symptoms such as headache or muscle aches and decreased dose back down to 7 puffs 4 times daily.   She feels well, about the same. She did not tolerate increased Tyvaso dose. Reports that she tried taking 8 puff once a day at different times but she did not feel well. Reports increased wheezing. Notices wheezing more in the  morning with slight productive cough. Rescue inhaler does help. Occasionally experiences a mild headache that does not bother her and passes quickly with a tylenol. Currently she is back to taking 7 puffs QID, however, she wants to increase her dose. States that she can see a huge different on inhaled vasodilator. No new oxygen requirement. Continues wearing 4-5 L oxygen at home and POC when out of the house. She does nto experience shortness of breath unless ambulating an extended distance or moves too fast. No sob at rest. Denies fever, significant headache, muscle aches or leg swelling. Up to date with flu shot.   Allergies  Allergen Reactions  . Sertraline Hcl Other (See Comments)    Cloudy thoughts, malaise    Immunization History  Administered Date(s) Administered  . Influenza Split 08/13/2011  . Influenza Whole 09/16/2007, 08/17/2009, 07/31/2010  . Influenza,inj,Quad PF,6+ Mos 07/12/2013, 08/09/2014, 08/10/2015, 07/23/2016, 07/23/2017, 06/19/2018  . Pneumococcal Conjugate-13 11/08/2014  . Pneumococcal Polysaccharide-23 08/05/2002, 02/27/2016  . Td 05/19/2006  . Tdap 04/26/2017  . Zoster 11/23/2014    Past Medical History:  Diagnosis Date  . Allergy   . Aortic atherosclerosis ([place])   . Arthritis   . CKD (chronic kidney disease), stage IV ([place])   . COPD (chronic obstructive pulmonary disease) ([place])    a. 11/2014 PFT's: FEV1 0.87 (38%), FVC 1.41 (48%), FEF 25-75 0.41 (19%). Unable to perform DLCO; b. 12/2017 Simple spirometry ration 67%. FEV1 1.21 L+53% predicted. FVC 1.79L 61% predicted.  . Coronary artery calcification seen on CT scan    a. 03/2017 CT  Chest.  . Depression   . Diabetic neuropathy ([place])   . Esophageal stricture   . Familial hematuria   . GERD (gastroesophageal reflux disease)   . Hyperlipidemia   . Hypertension   . IBS (irritable bowel syndrome)   . Nephrolithiasis   . NIDDM (non-insulin dependent diabetes mellitus)   . Obesity, unspecified   . Pulmonary  hypertension ([place])    a. WHO Group 3; b. 10/2016 Echo: EF 55-60%, no rwma, Gr1 DD, mild to mod AS, mean grad 51mHg, mildly dil RV w/ mildly reduced RV fxn, mildly dil RA. PASP 1160mg; c. 12/2017 RHC: RA 11, RV 92/12, PA 92/33(57), PCWP 15, Fick CO/CI 5.9/3.3. PVR 7.2 WU. Ao 93%, PA 62/64%.  . Marland KitchenVD (peripheral vascular disease) ([facility]    Tobacco History: Social History   Tobacco Use  Smoking Status Former Smoker  . Packs/day: 1.50  . Years: 33.00  . Pack years: 49.50  . Types: Cigarettes  . Last attempt to quit: 07/28/2005  . Years since quitting: 13.1  Smokeless Tobacco Never Used   Counseling given: Not Answered   Outpatient Medications Prior to Visit  Medication Sig Dispense Refill  . albuterol (VENTOLIN HFA) 108 (90 Base) MCG/ACT inhaler USE 2 PUFFS EVERY SIX HOURS AS NEEDED FOR WHEEZING OR SHORTNESS OF BREATH 18 g 3  . ALPRAZolam (XANAX) 0.25 MG tablet Take 1 tablet (0.25 mg total) by mouth daily as needed for anxiety. 30 tablet 0  . aspirin 325 MG tablet Take 325 mg by mouth at bedtime.     . calcitRIOL (ROCALTROL) 0.5 MCG capsule Take 0.5 mcg by mouth daily.     . cholecalciferol (VITAMIN D) 1000 units tablet Take 1,000 Units by mouth at bedtime.     . ferrous sulfate 325 (65 FE) MG tablet Take 1 tablet (325 mg total) by mouth 2 (two) times daily with a meal. 60 tablet 3  . gabapentin (NEURONTIN) 300 MG capsule Take 1 capsule (300 mg total) by mouth at bedtime. Increase to 2 capsules at bedtime if needed 60 capsule 2  . hydrocortisone 2.5 % cream Apply topically 3 (three) times daily as needed. 28 g 3  . Insulin Glargine (LANTUS SOLOSTAR) 100 UNIT/ML Solostar Pen Inject 20 Units into the skin at bedtime. 9 pen 5  . Insulin Pen Needle (SURE COMFORT PEN NEEDLES) 31G X 5 MM MISC USE 1 PEN NEEDLE TO INJECT INSULIN 90 each 3  . loratadine (CLARITIN) 10 MG tablet Take 10 mg by mouth 2 (two) times daily.    . Marland Kitchenovastatin (MEVACOR) 40 MG tablet Take 2 tablets (80 mg total) by mouth at  bedtime. 180 tablet 3  . methadone (DOLOPHINE) 10 MG tablet Take 2 tablets (20 mg total) by mouth 4 (four) times daily. 240 tablet 0  . metoprolol tartrate (LOPRESSOR) 25 MG tablet Take 1/2 tablet by mouth twice a day 90 tablet 3  . nitroGLYCERIN (NITROSTAT) 0.4 MG SL tablet Place 1 tablet (0.4 mg total) under the tongue every 5 (five) minutes as needed for chest pain. 100 tablet 1  . ondansetron (ZOFRAN ODT) 4 MG disintegrating tablet Take 1 tablet (4 mg total) by mouth every 8 (eight) hours as needed for nausea or vomiting. 20 tablet 0  . ONE TOUCH ULTRA TEST test strip USE 1 STRIP TO TEST BLOOD SUGAR ONCE DAILY 100 each 3  . pantoprazole (PROTONIX) 40 MG tablet Take 1 tablet by mouth twice a day 180 tablet 3  . Tiotropium Bromide-Olodaterol (STIOLTO RESPIMAT)  2.5-2.5 MCG/ACT AERS Inhale 2 puffs into the lungs daily. 4 g 5  . torsemide (DEMADEX) 20 MG tablet Take 2 tablets (40 mg total) by mouth daily. 180 tablet 3  . Treprostinil (TYVASO) 0.6 MG/ML SOLN Inhale 18 mcg into the lungs 4 (four) times daily.     No facility-administered medications prior to visit.     Review of Systems  Review of Systems  Constitutional: Negative.   HENT: Negative.   Respiratory: Positive for cough, shortness of breath and wheezing. Negative for apnea, choking, chest tightness and stridor.   Cardiovascular: Negative.   Musculoskeletal: Negative.   Neurological: Positive for headaches.  Psychiatric/Behavioral: Negative.     Physical Exam  BP 112/78 (BP Location: Right Arm, Cuff Size: Normal)   Pulse 89   Temp 98.2 F (36.8 C)   Ht 5' (1.524 m)   Wt 150 lb (68 kg)   SpO2 93%   BMI 29.29 kg/m  Physical Exam  Constitutional: She is oriented to person, place, and time. She appears well-developed and well-nourished.  HENT:  Head: Normocephalic and atraumatic.  Eyes: Pupils are equal, round, and reactive to light. EOM are normal.  Neck: Normal range of motion. Neck supple.  Cardiovascular: Normal  rate.  No edema  Pulmonary/Chest: Effort normal. No respiratory distress. She has wheezes.  Faint exp wheeze. No rhonchi   Musculoskeletal: Normal range of motion.  Ambulates with rolling walker  Neurological: She is alert and oriented to person, place, and time.  Skin: Skin is warm and dry.  Psychiatric: She has a normal mood and affect. Her behavior is normal. Judgment and thought content normal.     Lab Results:  CBC    Component Value Date/Time   WBC 9.0 07/28/2018 1054   RBC 5.35 (H) 07/28/2018 1054   HGB 15.5 07/28/2018 1054   HCT 44.9 07/28/2018 1054   PLT 187 07/28/2018 1054   MCV 83.8 07/28/2018 1054   MCH 29.0 07/28/2018 1054   MCHC 34.6 07/28/2018 1054   RDW 14.9 (H) 07/28/2018 1054   LYMPHSABS 1.4 07/28/2018 1054   MONOABS 0.6 07/28/2018 1054   EOSABS 0.5 07/28/2018 1054   BASOSABS 0.1 07/28/2018 1054    BMET    Component Value Date/Time   NA 139 07/28/2018 1054   K 3.7 07/28/2018 1054   CL 94 (L) 07/28/2018 1054   CO2 32 07/28/2018 1054   GLUCOSE 181 (H) 07/28/2018 1054   BUN 27 (H) 07/28/2018 1054   CREATININE 1.76 (H) 07/28/2018 1054   CALCIUM 10.0 07/28/2018 1054   GFRNONAA 29 (L) 07/28/2018 1054   GFRAA 34 (L) 07/28/2018 1054    BNP    Component Value Date/Time   BNP 1,679.0 (H) 04/19/2018 2215    ProBNP No results found for: PROBNP  Imaging: No results found.   Assessment & Plan:   Pulmonary hypertension (HCC) - Stable; feels a great deal better with addition of Tyvaso - Currently on 7 puffs QID, reported increased wheezing with recent trial increase - Discussed with Dr. Lake Bells, plan is to pre-treat with Albuterol nebulizer prior to increased dose of Tyvaso  - Goal is to slowly titrate dose up to 9 puffs QID over 8 weeks, if patient is unable to tolerate her baseline dose may just have to be 7 puffs QID - Follow up in 8 weeks   COPD (chronic obstructive pulmonary disease) (HCC) - Continues Stiolto 2 puffs daily - Has some  morning wheezing and cough, not significant enough to say  her COPD is currently exacerbated. Monitor may need abx/steriod if worsens  - Add albuterol nebulizer every 4-6 hours as needed for sob/wheeze  Chronic respiratory failure with hypoxia ([place]) - Stable; continues with home oxygen 4-5 L - POC when out  - No reports of oxygen desaturation    Martyn Ehrich, NP 09/29/2018

## 2018-09-29 NOTE — Progress Notes (Signed)
Reviewed, discuss, agree

## 2018-09-29 NOTE — Assessment & Plan Note (Addendum)
-  Continues Stiolto 2 puffs daily - Has some morning wheezing and cough, not significant enough to say her COPD is currently exacerbated. Monitor may need abx/steriod if worsens  - Add albuterol nebulizer every 4-6 hours as needed for sob/wheeze

## 2018-09-29 NOTE — Patient Instructions (Signed)
Try Tyvaso 8 puffs in morning and 7 puffs for remaining three doses x 1 week Then increase to 8 puffs twice a day and 7 puffs twice a day x 1 week  Then increase to 8 puffs three times a day and 7 puffs once a day x 1 week  Then increase to 8 puffs four times a day x 1 week  Continue slow taper increase to goal 9 puffs four times a day  If unable to tolerate, return to baseline dose of 7 puffs 4 times a day (let office know)  Pre-treat with Albuterol rescue inhaler or nebulizer before increased Tyvaso dose if having increased wheezing with dose   Follow up in 8 weeks with Dr. Lake Bells please

## 2018-09-30 ENCOUNTER — Ambulatory Visit: Payer: PPO | Admitting: Pulmonary Disease

## 2018-10-02 DIAGNOSIS — J449 Chronic obstructive pulmonary disease, unspecified: Secondary | ICD-10-CM | POA: Diagnosis not present

## 2018-10-02 DIAGNOSIS — J439 Emphysema, unspecified: Secondary | ICD-10-CM | POA: Diagnosis not present

## 2018-10-05 ENCOUNTER — Other Ambulatory Visit: Payer: Self-pay | Admitting: Internal Medicine

## 2018-10-06 DIAGNOSIS — I509 Heart failure, unspecified: Secondary | ICD-10-CM | POA: Diagnosis not present

## 2018-10-06 DIAGNOSIS — J449 Chronic obstructive pulmonary disease, unspecified: Secondary | ICD-10-CM | POA: Diagnosis not present

## 2018-10-06 NOTE — Telephone Encounter (Signed)
Name of Medication: Methadone Name of Pharmacy: [facility] or Written Date and Quantity: 08-31-18 #240 Last Office Visit and Type: CPE 09-18-18 Next Office Visit and Type: 3 Month F/U 12-21-18 Last Controlled Substance Agreement Date: 07-14-18 Last UDS: 07-14-18

## 2018-10-12 DIAGNOSIS — E1122 Type 2 diabetes mellitus with diabetic chronic kidney disease: Secondary | ICD-10-CM | POA: Diagnosis not present

## 2018-10-12 DIAGNOSIS — R809 Proteinuria, unspecified: Secondary | ICD-10-CM | POA: Diagnosis not present

## 2018-10-12 DIAGNOSIS — R600 Localized edema: Secondary | ICD-10-CM | POA: Diagnosis not present

## 2018-10-12 DIAGNOSIS — N2581 Secondary hyperparathyroidism of renal origin: Secondary | ICD-10-CM | POA: Diagnosis not present

## 2018-10-12 DIAGNOSIS — N184 Chronic kidney disease, stage 4 (severe): Secondary | ICD-10-CM | POA: Diagnosis not present

## 2018-10-19 ENCOUNTER — Telehealth: Payer: Self-pay | Admitting: Pulmonary Disease

## 2018-10-19 MED ORDER — ALBUTEROL SULFATE HFA 108 (90 BASE) MCG/ACT IN AERS: INHALATION_SPRAY | RESPIRATORY_TRACT | 5 refills | Status: DC

## 2018-10-19 MED ORDER — TIOTROPIUM BROMIDE-OLODATEROL 2.5-2.5 MCG/ACT IN AERS: 2.0000 | INHALATION_SPRAY | Freq: Every day | RESPIRATORY_TRACT | 5 refills | Status: DC

## 2018-10-19 MED ORDER — ALBUTEROL SULFATE (2.5 MG/3ML) 0.083% IN NEBU: 2.5000 mg | INHALATION_SOLUTION | Freq: Four times a day (QID) | RESPIRATORY_TRACT | 5 refills | Status: DC | PRN

## 2018-10-19 NOTE — Telephone Encounter (Signed)
Pharmacist called requesting rx refills for all prescriptions that our office sends for pt.  rx's have been sent.  Nothing further needed at this time.

## 2018-10-23 ENCOUNTER — Telehealth: Payer: Self-pay | Admitting: Pulmonary Disease

## 2018-10-23 ENCOUNTER — Telehealth: Payer: Self-pay | Admitting: Internal Medicine

## 2018-10-23 MED ORDER — INSULIN PEN NEEDLE 31G X 5 MM MISC: 3 refills | Status: DC

## 2018-10-23 MED ORDER — GLUCOSE BLOOD VI STRP: ORAL_STRIP | 3 refills | Status: DC

## 2018-10-23 MED ORDER — NITROGLYCERIN 0.4 MG SL SUBL: 0.4000 mg | SUBLINGUAL_TABLET | SUBLINGUAL | 1 refills | Status: DC | PRN

## 2018-10-23 MED ORDER — INSULIN GLARGINE 100 UNIT/ML SOLOSTAR PEN: 20.0000 [IU] | PEN_INJECTOR | Freq: Every day | SUBCUTANEOUS | 5 refills | Status: DC

## 2018-10-23 NOTE — Telephone Encounter (Signed)
Pt's is now [facility] because her old one closed.   Can you transfer these   diabetes test strips Needles Lantus insulin Nitro tablets  Methadone

## 2018-10-23 NOTE — Telephone Encounter (Signed)
Spoke to pt. She will ask for her Methadone closer to time.

## 2018-10-23 NOTE — Telephone Encounter (Signed)
lmtcb x1 for pt

## 2018-10-27 NOTE — Telephone Encounter (Signed)
Patient has been scheduled. Nothing further needed.

## 2018-10-27 NOTE — Telephone Encounter (Signed)
Please have her come in to be evaluated by Dr. Lake Bells if he has any appointments in the near future if not appointment can be with APP.

## 2018-10-27 NOTE — Telephone Encounter (Signed)
Called and spoke with pt to verify the pharmacy that pt wants to have the albuterol Rx to be sent to. Saw that both of pt's meds had been sent to the preferred pharmacy by Maury Dus on 10/19/18 for pt.  Also while speaking with pt, asked pt if she has still been having problems with the SOB and pt stated that she has. Pt states that she believes the way she has been feeling is due to the Tyvaso.   Pt stated she had been doing 2 sessions of 8 which she did 12/20 and pt stated when she began to have the increase in SOB with wheezing, she bumped back down to 1 session of 8 of the Tyvaso beginning 12/21 and stated she has been doing the 1 session of 8 since then.  Pt stated the SOB and wheezing is worse in the evening and she stated that when this happens, she gives herself a neb tx which has helped take care of her symptoms.  Pt wants to know recommendations in regards to what has been going on.  Due to Dr. Lake Bells being at hospital, sending to APP of day per protocol. Kenney Houseman, please advise recs for pt. Thanks!

## 2018-10-28 DIAGNOSIS — I509 Heart failure, unspecified: Secondary | ICD-10-CM | POA: Diagnosis not present

## 2018-10-28 DIAGNOSIS — J449 Chronic obstructive pulmonary disease, unspecified: Secondary | ICD-10-CM | POA: Diagnosis not present

## 2018-10-29 NOTE — Progress Notes (Deleted)
_0  ID: Lindsay Harmon, female    DOB: 10-19-1956, 63 y.o.   MRN: [id number]  No chief complaint on file.   Referring provider: Venia Carbon, MD  HPI:   PMH:  Smoker/ Smoking History:  Maintenance:   Pt of:   Synopsis: Referred by cardiology in 2016 for evaluation of severe COPD and possible obstructive sleep apnea in the setting of pulmonary hypertension. She also has congestive heart failure. A sleep study in 2016 showed no evidence of obstructive sleep apnea but she does have significant nocturnal hypoxemia. Spirometry testing in 2016 confirmed a diagnosis of COPD with severe airflow obstruction FEV1 41% pred.  She smoked cigarettes for many years and she quit smoking in 2013 after smoking 2 ppd for 40 years. In 2019 she took sildenafil and her oxygenation worsened while hospitalized.  We ultimately started Tyvaso.  HPI: 63 year old female, former smoker quit in 2006.  Past medical history significant for COPD, Neri hypertension, chronic respiratory failure with hypoxia, acute on chronic right heart failure, aortic arthrosclerosis, diabetes type 2, hypertension, right ventricular failure, chronic renal disease stage IV.  Dr. Lake Bells, pain on October 18.  Previously intolerant of oral systemic pulmonary vasodilators.  Appears stable on Tyvaso 7 puffs QID for PAH and Stiolto 2 puffs daily. Wears continuous home oxygen 4-5 liters.   Recent [facility] Encounters:     10/29/2018  - Visit   HPI  Tests:  Echo: December 2018 echocardiogram showed a normal LVEF RV was mildly dilated with an RVSP greater than 100 June 2019 transthoracic echocardiogram normal LVEF, RVSP estimate 115 mmHg October 2019 transthoracic echocardiogram normal LVEF RVSP estimate 90 mmHg  RHC 12/05/2014 RA 12 mmHg (mean) with O2 sat 72%; RV 97/16 mmHg with O2 sat 73%; PA 97/30 mmHg with O2 sat 67%; PCWP(mean) 14 mmHg (mean); Cardiac Output 5.78 L/min Right heart catheterization March 2019: PA  pressure 92/33, mean 57, pulmonary vascular resistance 7.2, cardiac output  5.9, pulmonary capillary wedge pressure 15.  Chest imaging: CT scan chest June 2018 images independently reviewed showing mild to moderate centrilobular emphysema in the upper lobes, some mosaicism in the bases. Feb 2016 V/Q IMPRESSION: No evidence of pulmonary embolism.   Pulmonary function testing: March 2019 simple spirometry ratio 67%, FEV1 1.21 L 53% predicted, FVC 1.79 L 61% predicted  Records from her recent visit with her primary care physician reviewed in July 2019 where she was seen for her COPD and other conditions.  Management was continued.   FENO:  No results found for: NITRICOXIDE  PFT: PFT Results Latest Ref Rng & Units 12/06/2014  FVC-Pre L 1.41  FVC-Predicted Pre % 48  FVC-Post L 1.40  FVC-Predicted Post % 47  Pre FEV1/FVC % % 62  Post FEV1/FCV % % 67  FEV1-Pre L 0.87  FEV1-Predicted Pre % 38  FEV1-Post L 0.93  TLC L 3.62  TLC % Predicted % 78  RV % Predicted % 114    Imaging: No results found.    Specialty Problems      Pulmonary Problems   Chronic respiratory failure with hypoxia (HCC)    01/05/2015> 2 L at rest, 3 L with exertion, 3L qHS 10/2017 ONO on 3L PM> < 88% 363 minutes;       COPD (chronic obstructive pulmonary disease) ([place])    2016 pulmonary function testing> ratio 67%, FEV1 0.93 L (41% predicted), total lung capacity 3.62 L (78% Pred), no DLCO reported         Allergies  Allergen Reactions  . Sertraline Hcl Other (See Comments)    Cloudy thoughts, malaise    Immunization History  Administered Date(s) Administered  . Influenza Split 08/13/2011  . Influenza Whole 09/16/2007, 08/17/2009, 07/31/2010  . Influenza,inj,Quad PF,6+ Mos 07/12/2013, 08/09/2014, 08/10/2015, 07/23/2016, 07/23/2017, 06/19/2018  . Pneumococcal Conjugate-13 11/08/2014  . Pneumococcal Polysaccharide-23 08/05/2002, 02/27/2016  . Td 05/19/2006  . Tdap 04/26/2017  . Zoster 11/23/2014     Past Medical History:  Diagnosis Date  . Allergy   . Aortic atherosclerosis ([place])   . Arthritis   . CKD (chronic kidney disease), stage IV ([place])   . COPD (chronic obstructive pulmonary disease) ([place])    a. 11/2014 PFT's: FEV1 0.87 (38%), FVC 1.41 (48%), FEF 25-75 0.41 (19%). Unable to perform DLCO; b. 12/2017 Simple spirometry ration 67%. FEV1 1.21 L+53% predicted. FVC 1.79L 61% predicted.  . Coronary artery calcification seen on CT scan    a. 03/2017 CT Chest.  . Depression   . Diabetic neuropathy ([place])   . Esophageal stricture   . Familial hematuria   . GERD (gastroesophageal reflux disease)   . Hyperlipidemia   . Hypertension   . IBS (irritable bowel syndrome)   . Nephrolithiasis   . NIDDM (non-insulin dependent diabetes mellitus)   . Obesity, unspecified   . Pulmonary hypertension ([place])    a. WHO Group 3; b. 10/2016 Echo: EF 55-60%, no rwma, Gr1 DD, mild to mod AS, mean grad 35mHg, mildly dil RV w/ mildly reduced RV fxn, mildly dil RA. PASP 1143mg; c. 12/2017 RHC: RA 11, RV 92/12, PA 92/33(57), PCWP 15, Fick CO/CI 5.9/3.3. PVR 7.2 WU. Ao 93%, PA 62/64%.  . Marland KitchenVD (peripheral vascular disease) ([facility]    Tobacco History: Social History   Tobacco Use  Smoking Status Former Smoker  . Packs/day: 1.50  . Years: 33.00  . Pack years: 49.50  . Types: Cigarettes  . Last attempt to quit: 07/28/2005  . Years since quitting: 13.2  Smokeless Tobacco Never Used   Counseling given: Not Answered   Outpatient Encounter Medications as of 10/30/2018  Medication Sig  . albuterol (PROVENTIL) (2.5 MG/3ML) 0.083% nebulizer solution Take 3 mLs (2.5 mg total) by nebulization every 6 (six) hours as needed for wheezing or shortness of breath.  . Marland Kitchenlbuterol (VENTOLIN HFA) 108 (90 Base) MCG/ACT inhaler USE 2 PUFFS EVERY SIX HOURS AS NEEDED FOR WHEEZING OR SHORTNESS OF BREATH  . ALPRAZolam (XANAX) 0.25 MG tablet Take 1 tablet (0.25 mg total) by mouth daily as needed for anxiety.  . Marland Kitchenspirin 325 MG  tablet Take 325 mg by mouth at bedtime.   . calcitRIOL (ROCALTROL) 0.5 MCG capsule Take 0.5 mcg by mouth daily.   . cholecalciferol (VITAMIN D) 1000 units tablet Take 1,000 Units by mouth at bedtime.   . ferrous sulfate 325 (65 FE) MG tablet Take 1 tablet (325 mg total) by mouth 2 (two) times daily with a meal.  . gabapentin (NEURONTIN) 300 MG capsule Take 1 capsule (300 mg total) by mouth at bedtime. Increase to 2 capsules at bedtime if needed  . glucose blood (ONE TOUCH ULTRA TEST) test strip USE 1 STRIP TO TEST BLOOD SUGAR ONCE DAILY Dx Code E11.51  . hydrocortisone 2.5 % cream Apply topically 3 (three) times daily as needed.  . Insulin Glargine (LANTUS SOLOSTAR) 100 UNIT/ML Solostar Pen Inject 20 Units into the skin at bedtime.  . Insulin Pen Needle (SURE COMFORT PEN NEEDLES) 31G X 5 MM MISC USE 1 PEN NEEDLE TO INJECT  INSULIN  . loratadine (CLARITIN) 10 MG tablet Take 10 mg by mouth 2 (two) times daily.  Marland Kitchen lovastatin (MEVACOR) 40 MG tablet Take 2 tablets (80 mg total) by mouth at bedtime.  . methadone (DOLOPHINE) 10 MG tablet Take 2 tablets (20 mg total) by mouth 4 (four) times daily.  . metoprolol tartrate (LOPRESSOR) 25 MG tablet Take 1/2 tablet by mouth twice a day  . nitroGLYCERIN (NITROSTAT) 0.4 MG SL tablet Place 1 tablet (0.4 mg total) under the tongue every 5 (five) minutes as needed for chest pain.  Marland Kitchen ondansetron (ZOFRAN ODT) 4 MG disintegrating tablet Take 1 tablet (4 mg total) by mouth every 8 (eight) hours as needed for nausea or vomiting.  . pantoprazole (PROTONIX) 40 MG tablet Take 1 tablet by mouth twice a day  . Tiotropium Bromide-Olodaterol (STIOLTO RESPIMAT) 2.5-2.5 MCG/ACT AERS Inhale 2 puffs into the lungs daily.  Marland Kitchen torsemide (DEMADEX) 20 MG tablet Take 2 tablets (40 mg total) by mouth daily.  . Treprostinil (TYVASO) 0.6 MG/ML SOLN Inhale 18 mcg into the lungs 4 (four) times daily.   No facility-administered encounter medications on file as of 10/30/2018.      Review of  Systems  Review of Systems   Physical Exam  There were no vitals taken for this visit.  Wt Readings from Last 5 Encounters:  09/29/18 150 lb (68 kg)  09/18/18 149 lb (67.6 kg)  08/14/18 147 lb (66.7 kg)  08/11/18 146 lb 1.6 oz (66.3 kg)  07/28/18 144 lb 14.4 oz (65.7 kg)     Physical Exam    Lab Results:  CBC    Component Value Date/Time   WBC 9.0 07/28/2018 1054   RBC 5.35 (H) 07/28/2018 1054   HGB 15.5 07/28/2018 1054   HCT 44.9 07/28/2018 1054   PLT 187 07/28/2018 1054   MCV 83.8 07/28/2018 1054   MCH 29.0 07/28/2018 1054   MCHC 34.6 07/28/2018 1054   RDW 14.9 (H) 07/28/2018 1054   LYMPHSABS 1.4 07/28/2018 1054   MONOABS 0.6 07/28/2018 1054   EOSABS 0.5 07/28/2018 1054   BASOSABS 0.1 07/28/2018 1054    BMET    Component Value Date/Time   NA 139 07/28/2018 1054   K 3.7 07/28/2018 1054   CL 94 (L) 07/28/2018 1054   CO2 32 07/28/2018 1054   GLUCOSE 181 (H) 07/28/2018 1054   BUN 27 (H) 07/28/2018 1054   CREATININE 1.76 (H) 07/28/2018 1054   CALCIUM 10.0 07/28/2018 1054   GFRNONAA 29 (L) 07/28/2018 1054   GFRAA 34 (L) 07/28/2018 1054    BNP    Component Value Date/Time   BNP 1,679.0 (H) 04/19/2018 2215    ProBNP No results found for: PROBNP    Assessment & Plan:     No problem-specific Assessment & Plan notes found for this encounter.     Lauraine Rinne, NP 10/29/2018   This appointment was *** with over 50% of the time in direct face-to-face patient care, assessment, plan of care, and follow-up.

## 2018-10-30 ENCOUNTER — Ambulatory Visit: Payer: PPO | Admitting: Pulmonary Disease

## 2018-11-01 NOTE — Progress Notes (Deleted)
_0  ID: Lindsay Harmon, female    DOB: 1956/01/06, 63 y.o.   MRN: [id number]  No chief complaint on file.   Referring provider: Venia Carbon, MD  HPI:   PMH:  Smoker/ Smoking History:  Maintenance:   Pt of:   Recent [facility] Encounters:   PMH:  Smoker/ Smoking History:  Maintenance:   Pt of:   Synopsis: Referred by cardiology in 2016 for evaluation of severe COPD and possible obstructive sleep apnea in the setting of pulmonary hypertension. She also has congestive heart failure. A sleep study in 2016 showed no evidence of obstructive sleep apnea but she does have significant nocturnal hypoxemia. Spirometry testing in 2016 confirmed a diagnosis of COPD with severe airflow obstruction FEV1 41% pred.  She smoked cigarettes for many years and she quit smoking in 2013 after smoking 2 ppd for 40 years. In 2019 she took sildenafil and her oxygenation worsened while hospitalized.  We ultimately started Tyvaso.  HPI: 63 year old female, former smoker quit in 2006.  Past medical history significant for COPD, Neri hypertension, chronic respiratory failure with hypoxia, acute on chronic right heart failure, aortic arthrosclerosis, diabetes type 2, hypertension, right ventricular failure, chronic renal disease stage IV.  Dr. Lake Bells, pain on October 18.  Previously intolerant of oral systemic pulmonary vasodilators.  Appears stable on Tyvaso 7 puffs QID for PAH and Stiolto 2 puffs daily. Wears continuous home oxygen 4-5 liters.    11/01/2018  - Visit   HPI  Tests:  Echo: December 2018 echocardiogram showed a normal LVEF RV was mildly dilated with an RVSP greater than 100 June 2019 transthoracic echocardiogram normal LVEF, RVSP estimate 115 mmHg October 2019 transthoracic echocardiogram normal LVEF RVSP estimate 90 mmHg  RHC 12/05/2014 RA 12 mmHg (mean) with O2 sat 72%; RV 97/16 mmHg with O2 sat 73%; PA 97/30 mmHg with O2 sat 67%; PCWP(mean) 14 mmHg (mean); Cardiac Output  5.78 L/min Right heart catheterization March 2019: PA pressure 92/33, mean 57, pulmonary vascular resistance 7.2, cardiac output  5.9, pulmonary capillary wedge pressure 15.  Chest imaging: CT scan chest June 2018 images independently reviewed showing mild to moderate centrilobular emphysema in the upper lobes, some mosaicism in the bases. Feb 2016 V/Q IMPRESSION: No evidence of pulmonary embolism.   Pulmonary function testing: March 2019 simple spirometry ratio 67%, FEV1 1.21 L 53% predicted, FVC 1.79 L 61% predicted  Records from her recent visit with her primary care physician reviewed in July 2019 where she was seen for her COPD and other conditions.  Management was continued.   FENO:  No results found for: NITRICOXIDE  PFT: PFT Results Latest Ref Rng & Units 12/06/2014  FVC-Pre L 1.41  FVC-Predicted Pre % 48  FVC-Post L 1.40  FVC-Predicted Post % 47  Pre FEV1/FVC % % 62  Post FEV1/FCV % % 67  FEV1-Pre L 0.87  FEV1-Predicted Pre % 38  FEV1-Post L 0.93  TLC L 3.62  TLC % Predicted % 78  RV % Predicted % 114    Imaging: No results found.    Specialty Problems      Pulmonary Problems   Chronic respiratory failure with hypoxia (HCC)    01/05/2015> 2 L at rest, 3 L with exertion, 3L qHS 10/2017 ONO on 3L PM> < 88% 363 minutes;       COPD (chronic obstructive pulmonary disease) ([place])    2016 pulmonary function testing> ratio 67%, FEV1 0.93 L (41% predicted), total lung capacity 3.62 L (78% Pred),  no DLCO reported         Allergies  Allergen Reactions  . Sertraline Hcl Other (See Comments)    Cloudy thoughts, malaise    Immunization History  Administered Date(s) Administered  . Influenza Split 08/13/2011  . Influenza Whole 09/16/2007, 08/17/2009, 07/31/2010  . Influenza,inj,Quad PF,6+ Mos 07/12/2013, 08/09/2014, 08/10/2015, 07/23/2016, 07/23/2017, 06/19/2018  . Pneumococcal Conjugate-13 11/08/2014  . Pneumococcal Polysaccharide-23 08/05/2002, 02/27/2016  .  Td 05/19/2006  . Tdap 04/26/2017  . Zoster 11/23/2014    Past Medical History:  Diagnosis Date  . Allergy   . Aortic atherosclerosis ([place])   . Arthritis   . CKD (chronic kidney disease), stage IV ([place])   . COPD (chronic obstructive pulmonary disease) ([place])    a. 11/2014 PFT's: FEV1 0.87 (38%), FVC 1.41 (48%), FEF 25-75 0.41 (19%). Unable to perform DLCO; b. 12/2017 Simple spirometry ration 67%. FEV1 1.21 L+53% predicted. FVC 1.79L 61% predicted.  . Coronary artery calcification seen on CT scan    a. 03/2017 CT Chest.  . Depression   . Diabetic neuropathy ([place])   . Esophageal stricture   . Familial hematuria   . GERD (gastroesophageal reflux disease)   . Hyperlipidemia   . Hypertension   . IBS (irritable bowel syndrome)   . Nephrolithiasis   . NIDDM (non-insulin dependent diabetes mellitus)   . Obesity, unspecified   . Pulmonary hypertension ([place])    a. WHO Group 3; b. 10/2016 Echo: EF 55-60%, no rwma, Gr1 DD, mild to mod AS, mean grad 72mHg, mildly dil RV w/ mildly reduced RV fxn, mildly dil RA. PASP 1141mg; c. 12/2017 RHC: RA 11, RV 92/12, PA 92/33(57), PCWP 15, Fick CO/CI 5.9/3.3. PVR 7.2 WU. Ao 93%, PA 62/64%.  . Marland KitchenVD (peripheral vascular disease) ([facility]    Tobacco History: Social History   Tobacco Use  Smoking Status Former Smoker  . Packs/day: 1.50  . Years: 33.00  . Pack years: 49.50  . Types: Cigarettes  . Last attempt to quit: 07/28/2005  . Years since quitting: 13.2  Smokeless Tobacco Never Used   Counseling given: Not Answered   Outpatient Encounter Medications as of 11/02/2018  Medication Sig  . albuterol (PROVENTIL) (2.5 MG/3ML) 0.083% nebulizer solution Take 3 mLs (2.5 mg total) by nebulization every 6 (six) hours as needed for wheezing or shortness of breath.  . Marland Kitchenlbuterol (VENTOLIN HFA) 108 (90 Base) MCG/ACT inhaler USE 2 PUFFS EVERY SIX HOURS AS NEEDED FOR WHEEZING OR SHORTNESS OF BREATH  . ALPRAZolam (XANAX) 0.25 MG tablet Take 1 tablet (0.25 mg total) by  mouth daily as needed for anxiety.  . Marland Kitchenspirin 325 MG tablet Take 325 mg by mouth at bedtime.   . calcitRIOL (ROCALTROL) 0.5 MCG capsule Take 0.5 mcg by mouth daily.   . cholecalciferol (VITAMIN D) 1000 units tablet Take 1,000 Units by mouth at bedtime.   . ferrous sulfate 325 (65 FE) MG tablet Take 1 tablet (325 mg total) by mouth 2 (two) times daily with a meal.  . gabapentin (NEURONTIN) 300 MG capsule Take 1 capsule (300 mg total) by mouth at bedtime. Increase to 2 capsules at bedtime if needed  . glucose blood (ONE TOUCH ULTRA TEST) test strip USE 1 STRIP TO TEST BLOOD SUGAR ONCE DAILY Dx Code E11.51  . hydrocortisone 2.5 % cream Apply topically 3 (three) times daily as needed.  . Insulin Glargine (LANTUS SOLOSTAR) 100 UNIT/ML Solostar Pen Inject 20 Units into the skin at bedtime.  . Insulin Pen Needle (SURE COMFORT  PEN NEEDLES) 31G X 5 MM MISC USE 1 PEN NEEDLE TO INJECT INSULIN  . loratadine (CLARITIN) 10 MG tablet Take 10 mg by mouth 2 (two) times daily.  Marland Kitchen lovastatin (MEVACOR) 40 MG tablet Take 2 tablets (80 mg total) by mouth at bedtime.  . methadone (DOLOPHINE) 10 MG tablet Take 2 tablets (20 mg total) by mouth 4 (four) times daily.  . metoprolol tartrate (LOPRESSOR) 25 MG tablet Take 1/2 tablet by mouth twice a day  . nitroGLYCERIN (NITROSTAT) 0.4 MG SL tablet Place 1 tablet (0.4 mg total) under the tongue every 5 (five) minutes as needed for chest pain.  Marland Kitchen ondansetron (ZOFRAN ODT) 4 MG disintegrating tablet Take 1 tablet (4 mg total) by mouth every 8 (eight) hours as needed for nausea or vomiting.  . pantoprazole (PROTONIX) 40 MG tablet Take 1 tablet by mouth twice a day  . Tiotropium Bromide-Olodaterol (STIOLTO RESPIMAT) 2.5-2.5 MCG/ACT AERS Inhale 2 puffs into the lungs daily.  Marland Kitchen torsemide (DEMADEX) 20 MG tablet Take 2 tablets (40 mg total) by mouth daily.  . Treprostinil (TYVASO) 0.6 MG/ML SOLN Inhale 18 mcg into the lungs 4 (four) times daily.   No facility-administered encounter  medications on file as of 11/02/2018.      Review of Systems  Review of Systems   Physical Exam  There were no vitals taken for this visit.  Wt Readings from Last 5 Encounters:  09/29/18 150 lb (68 kg)  09/18/18 149 lb (67.6 kg)  08/14/18 147 lb (66.7 kg)  08/11/18 146 lb 1.6 oz (66.3 kg)  07/28/18 144 lb 14.4 oz (65.7 kg)     Physical Exam    Lab Results:  CBC    Component Value Date/Time   WBC 9.0 07/28/2018 1054   RBC 5.35 (H) 07/28/2018 1054   HGB 15.5 07/28/2018 1054   HCT 44.9 07/28/2018 1054   PLT 187 07/28/2018 1054   MCV 83.8 07/28/2018 1054   MCH 29.0 07/28/2018 1054   MCHC 34.6 07/28/2018 1054   RDW 14.9 (H) 07/28/2018 1054   LYMPHSABS 1.4 07/28/2018 1054   MONOABS 0.6 07/28/2018 1054   EOSABS 0.5 07/28/2018 1054   BASOSABS 0.1 07/28/2018 1054    BMET    Component Value Date/Time   NA 139 07/28/2018 1054   K 3.7 07/28/2018 1054   CL 94 (L) 07/28/2018 1054   CO2 32 07/28/2018 1054   GLUCOSE 181 (H) 07/28/2018 1054   BUN 27 (H) 07/28/2018 1054   CREATININE 1.76 (H) 07/28/2018 1054   CALCIUM 10.0 07/28/2018 1054   GFRNONAA 29 (L) 07/28/2018 1054   GFRAA 34 (L) 07/28/2018 1054    BNP    Component Value Date/Time   BNP 1,679.0 (H) 04/19/2018 2215    ProBNP No results found for: PROBNP    Assessment & Plan:     No problem-specific Assessment & Plan notes found for this encounter.     Lauraine Rinne, NP 11/01/2018   This appointment was *** with over 50% of the time in direct face-to-face patient care, assessment, plan of care, and follow-up.

## 2018-11-02 ENCOUNTER — Ambulatory Visit (INDEPENDENT_AMBULATORY_CARE_PROVIDER_SITE_OTHER)
Admission: RE | Admit: 2018-11-02 | Discharge: 2018-11-02 | Disposition: A | Discharge status: Home / Self Care | Payer: PPO | Source: Ambulatory Visit | Attending: Primary Care | Admitting: Primary Care

## 2018-11-02 ENCOUNTER — Ambulatory Visit: Payer: PPO | Admitting: Pulmonary Disease

## 2018-11-02 ENCOUNTER — Ambulatory Visit: Payer: PPO | Admitting: Primary Care

## 2018-11-02 ENCOUNTER — Encounter: Payer: Self-pay | Admitting: Primary Care

## 2018-11-02 VITALS — BP 134/78 | HR 82 | Temp 98.3°F | Ht 60.0 in | Wt 149.2 lb

## 2018-11-02 DIAGNOSIS — J439 Emphysema, unspecified: Secondary | ICD-10-CM | POA: Diagnosis not present

## 2018-11-02 DIAGNOSIS — J449 Chronic obstructive pulmonary disease, unspecified: Secondary | ICD-10-CM | POA: Diagnosis not present

## 2018-11-02 DIAGNOSIS — R0602 Shortness of breath: Secondary | ICD-10-CM | POA: Diagnosis not present

## 2018-11-02 DIAGNOSIS — I272 Pulmonary hypertension, unspecified: Secondary | ICD-10-CM

## 2018-11-02 MED ORDER — ALBUTEROL SULFATE (2.5 MG/3ML) 0.083% IN NEBU: 2.5000 mg | INHALATION_SOLUTION | Freq: Four times a day (QID) | RESPIRATORY_TRACT | 5 refills | Status: DC | PRN

## 2018-11-02 NOTE — Patient Instructions (Addendum)
CXR today  OK to continue Albuterol nebulizer twice a day as needed for shortness of breath or wheezing  - Sending in new prescription  Continue Tyvaso as you are currently taking (One session of 8, three sessions of 7)  Next visit 2/4 at 11am with Dr. Lake Bells

## 2018-11-02 NOTE — Progress Notes (Signed)
_0  ID: Lindsay Harmon, female    DOB: 10-07-1956, 63 y.o.   MRN: [id number]  Chief Complaint  Patient presents with  . Follow-up    am and pm breathing is worse, cough after nebs with tan mucus,     Referring provider: Venia Carbon, MD   Synopsis: Referred by cardiology in 2016 for evaluation of severe COPD and possible obstructive sleep apnea in the setting of pulmonary hypertension. She also has congestive heart failure. A sleep study in 2016 showed no evidence of obstructive sleep apnea but she does have significant nocturnal hypoxemia. Spirometry testing in 2016 confirmed a diagnosis of COPD with severe airflow obstruction FEV1 41% pred.  She smoked cigarettes for many years and she quit smoking in 2013 after smoking 2 ppd for 40 years. In 2019 she took sildenafil and her oxygenation worsened while hospitalized.  We ultimately started Tyvaso.  HPI: 63 year old female, former smoker quit in 2006 (50 pack year hx). PMH significant for COPD, pulmonary hypertension. Patient of Dr. Lake Bells, last seen on 09/29/18. Maintained on Stiolto and Tyvaso.   11/03/2018 Patient presents today for ROV. Feels well. She has been experiencing more sob/wheezing in the morning and evening, albuterol nebulizer relieves her symptoms and brings up tan mucus. Continues using 4-5L oxygen, no increase in O2 demand. Using Tyvaso 8 puffs in am and 7 puffs TID since December 20th.    Allergies  Allergen Reactions  . Sertraline Hcl Other (See Comments)    Cloudy thoughts, malaise    Immunization History  Administered Date(s) Administered  . Influenza Split 08/13/2011  . Influenza Whole 09/16/2007, 08/17/2009, 07/31/2010  . Influenza,inj,Quad PF,6+ Mos 07/12/2013, 08/09/2014, 08/10/2015, 07/23/2016, 07/23/2017, 06/19/2018  . Pneumococcal Conjugate-13 11/08/2014  . Pneumococcal Polysaccharide-23 08/05/2002, 02/27/2016  . Td 05/19/2006  . Tdap 04/26/2017  . Zoster 11/23/2014    Past Medical History:   Diagnosis Date  . Allergy   . Aortic atherosclerosis ([place])   . Arthritis   . CKD (chronic kidney disease), stage IV ([place])   . COPD (chronic obstructive pulmonary disease) ([place])    a. 11/2014 PFT's: FEV1 0.87 (38%), FVC 1.41 (48%), FEF 25-75 0.41 (19%). Unable to perform DLCO; b. 12/2017 Simple spirometry ration 67%. FEV1 1.21 L+53% predicted. FVC 1.79L 61% predicted.  . Coronary artery calcification seen on CT scan    a. 03/2017 CT Chest.  . Depression   . Diabetic neuropathy ([place])   . Esophageal stricture   . Familial hematuria   . GERD (gastroesophageal reflux disease)   . Hyperlipidemia   . Hypertension   . IBS (irritable bowel syndrome)   . Nephrolithiasis   . NIDDM (non-insulin dependent diabetes mellitus)   . Obesity, unspecified   . Pulmonary hypertension ([place])    a. WHO Group 3; b. 10/2016 Echo: EF 55-60%, no rwma, Gr1 DD, mild to mod AS, mean grad 74mHg, mildly dil RV w/ mildly reduced RV fxn, mildly dil RA. PASP 111mg; c. 12/2017 RHC: RA 11, RV 92/12, PA 92/33(57), PCWP 15, Fick CO/CI 5.9/3.3. PVR 7.2 WU. Ao 93%, PA 62/64%.  . Marland KitchenVD (peripheral vascular disease) ([place]    Tobacco History: Social History   Tobacco Use  Smoking Status Former Smoker  . Packs/day: 1.50  . Years: 33.00  . Pack years: 49.50  . Types: Cigarettes  . Last attempt to quit: 07/28/2005  . Years since quitting: 13.2  Smokeless Tobacco Never Used   Counseling given: Not Answered   Outpatient Medications Prior to  Visit  Medication Sig Dispense Refill  . albuterol (VENTOLIN HFA) 108 (90 Base) MCG/ACT inhaler USE 2 PUFFS EVERY SIX HOURS AS NEEDED FOR WHEEZING OR SHORTNESS OF BREATH 18 g 5  . ALPRAZolam (XANAX) 0.25 MG tablet Take 1 tablet (0.25 mg total) by mouth daily as needed for anxiety. 30 tablet 0  . aspirin 325 MG tablet Take 325 mg by mouth at bedtime.     . calcitRIOL (ROCALTROL) 0.5 MCG capsule Take 0.5 mcg by mouth daily.     . cholecalciferol (VITAMIN D) 1000 units tablet Take 1,000  Units by mouth at bedtime.     . ferrous sulfate 325 (65 FE) MG tablet Take 1 tablet (325 mg total) by mouth 2 (two) times daily with a meal. (Patient taking differently: Take 325 mg by mouth daily after breakfast. ) 60 tablet 3  . gabapentin (NEURONTIN) 300 MG capsule Take 1 capsule (300 mg total) by mouth at bedtime. Increase to 2 capsules at bedtime if needed (Patient taking differently: Take 600 mg by mouth at bedtime. Increase to 2 capsules at bedtime if needed) 60 capsule 2  . glucose blood (ONE TOUCH ULTRA TEST) test strip USE 1 STRIP TO TEST BLOOD SUGAR ONCE DAILY Dx Code E11.51 100 each 3  . hydrocortisone 2.5 % cream Apply topically 3 (three) times daily as needed. 28 g 3  . Insulin Glargine (LANTUS SOLOSTAR) 100 UNIT/ML Solostar Pen Inject 20 Units into the skin at bedtime. 9 pen 5  . Insulin Pen Needle (SURE COMFORT PEN NEEDLES) 31G X 5 MM MISC USE 1 PEN NEEDLE TO INJECT INSULIN 90 each 3  . loratadine (CLARITIN) 10 MG tablet Take 10 mg by mouth 2 (two) times daily.    Marland Kitchen lovastatin (MEVACOR) 40 MG tablet Take 2 tablets (80 mg total) by mouth at bedtime. 180 tablet 3  . methadone (DOLOPHINE) 10 MG tablet Take 2 tablets (20 mg total) by mouth 4 (four) times daily. 240 tablet 0  . metoprolol tartrate (LOPRESSOR) 25 MG tablet Take 1/2 tablet by mouth twice a day 90 tablet 3  . nitroGLYCERIN (NITROSTAT) 0.4 MG SL tablet Place 1 tablet (0.4 mg total) under the tongue every 5 (five) minutes as needed for chest pain. 100 tablet 1  . ondansetron (ZOFRAN ODT) 4 MG disintegrating tablet Take 1 tablet (4 mg total) by mouth every 8 (eight) hours as needed for nausea or vomiting. 20 tablet 0  . pantoprazole (PROTONIX) 40 MG tablet Take 1 tablet by mouth twice a day 180 tablet 3  . Tiotropium Bromide-Olodaterol (STIOLTO RESPIMAT) 2.5-2.5 MCG/ACT AERS Inhale 2 puffs into the lungs daily. 4 g 5  . Treprostinil (TYVASO) 0.6 MG/ML SOLN Inhale 18 mcg into the lungs 4 (four) times daily.    Marland Kitchen albuterol  (PROVENTIL) (2.5 MG/3ML) 0.083% nebulizer solution Take 3 mLs (2.5 mg total) by nebulization every 6 (six) hours as needed for wheezing or shortness of breath. 360 mL 5  . torsemide (DEMADEX) 20 MG tablet Take 2 tablets (40 mg total) by mouth daily. 180 tablet 3   No facility-administered medications prior to visit.     Review of Systems  Review of Systems  Constitutional: Negative.   HENT: Negative.   Respiratory: Positive for cough and shortness of breath. Negative for wheezing.   Cardiovascular: Negative.     Physical Exam  BP 134/78 (BP Location: Right Arm, Cuff Size: Normal)   Pulse 82   Temp 98.3 F (36.8 C)   Ht 5' (1.524  m)   Wt 149 lb 3.2 oz (67.7 kg)   SpO2 90%   BMI 29.14 kg/m  Physical Exam Constitutional:      Appearance: Normal appearance.  HENT:     Head: Normocephalic and atraumatic.     Mouth/Throat:     Mouth: Mucous membranes are moist.     Pharynx: Oropharynx is clear.  Eyes:     Extraocular Movements: Extraocular movements intact.  Neck:     Musculoskeletal: Normal range of motion and neck supple.  Cardiovascular:     Rate and Rhythm: Normal rate and regular rhythm.  Pulmonary:     Effort: Pulmonary effort is normal. No respiratory distress.     Breath sounds: No stridor. No wheezing.     Comments: Mostly clear. Scattered rales bilateral bases. No wheeze Musculoskeletal: Normal range of motion.  Skin:    General: Skin is warm and dry.  Neurological:     General: No focal deficit present.     Mental Status: She is alert and oriented to person, place, and time. Mental status is at baseline.  Psychiatric:        Mood and Affect: Mood normal.        Behavior: Behavior normal.        Thought Content: Thought content normal.        Judgment: Judgment normal.      Lab Results:  CBC    Component Value Date/Time   WBC 9.0 07/28/2018 1054   RBC 5.35 (H) 07/28/2018 1054   HGB 15.5 07/28/2018 1054   HCT 44.9 07/28/2018 1054   PLT 187  07/28/2018 1054   MCV 83.8 07/28/2018 1054   MCH 29.0 07/28/2018 1054   MCHC 34.6 07/28/2018 1054   RDW 14.9 (H) 07/28/2018 1054   LYMPHSABS 1.4 07/28/2018 1054   MONOABS 0.6 07/28/2018 1054   EOSABS 0.5 07/28/2018 1054   BASOSABS 0.1 07/28/2018 1054    BMET    Component Value Date/Time   NA 139 07/28/2018 1054   K 3.7 07/28/2018 1054   CL 94 (L) 07/28/2018 1054   CO2 32 07/28/2018 1054   GLUCOSE 181 (H) 07/28/2018 1054   BUN 27 (H) 07/28/2018 1054   CREATININE 1.76 (H) 07/28/2018 1054   CALCIUM 10.0 07/28/2018 1054   GFRNONAA 29 (L) 07/28/2018 1054   GFRAA 34 (L) 07/28/2018 1054    BNP    Component Value Date/Time   BNP 1,679.0 (H) 04/19/2018 2215    ProBNP No results found for: PROBNP  Imaging: Dg Chest 2 View  Result Date: 11/02/2018 CLINICAL DATA:  Chronic obstructive pulmonary disease. Shortness of breath. EXAM: CHEST - 2 VIEW COMPARISON:  Radiographs of April 19, 2018. FINDINGS: The heart size and mediastinal contours are within normal limits. Both lungs are clear. No pneumothorax or pleural effusion is noted. Atherosclerosis of thoracic aorta is noted. The visualized skeletal structures are unremarkable. IMPRESSION: No active cardiopulmonary disease. Aortic Atherosclerosis (ICD10-I70.0). Electronically Signed   By: Marijo Conception, M.D.   On: 11/02/2018 21:55     Assessment & Plan:   COPD (chronic obstructive pulmonary disease) (H[facility]) - No signs of acute exacerbation - CXR with no active cardiopulmonary disease  - OK to continue Albuterol nebulizer twice a day as needed for shortness of breath or wheezing   Pulmonary hypertension ([place]) - Unable to titrate to Tyvaso goal of 9 puffs QID  - Doing fairly well on current dose 8 puffs in am and 7 puffs TID  Patient has scheduled visit with Dr. Lake Bells on 2/4  Martyn Ehrich, NP 11/03/2018

## 2018-11-03 ENCOUNTER — Encounter: Payer: Self-pay | Admitting: Primary Care

## 2018-11-03 NOTE — Assessment & Plan Note (Signed)
-  Unable to titrate to Tyvaso goal of 9 puffs QID  - Doing fairly well on current dose 8 puffs in am and 7 puffs TID

## 2018-11-03 NOTE — Progress Notes (Signed)
Reviewed, agree 

## 2018-11-03 NOTE — Assessment & Plan Note (Addendum)
-  No signs of acute exacerbation - CXR with no active cardiopulmonary disease  - OK to continue Albuterol nebulizer twice a day as needed for shortness of breath or wheezing

## 2018-11-05 ENCOUNTER — Telehealth: Payer: Self-pay | Admitting: Pulmonary Disease

## 2018-11-05 NOTE — Telephone Encounter (Signed)
Called and spoke to pt.  Pt states she is taking one session of 8 puffs and 3 session of 7 puffs of Tyvaso.  Pt states since increasing to 8 puffs, she has developed increased sob with exertion. Pt also reports of mild wheezing, however wheezing subsides with albuterol neb. Pt did albuterol treatments TID yesterday with temporary relief in sob and wheezing.  Pt states she is continuing stiolto at 2 puffs daily.  Pt is requesting to reduce first session of Tyvaso to 7 puffs, to see if this will help sob.    Tonya please advise, as BQ is unavailable. Thanks

## 2018-11-05 NOTE — Telephone Encounter (Signed)
Yes. She can decrease to 7 puffs QID. Please schedule follow up with Dr. Lake Bells at his first available.

## 2018-11-05 NOTE — Telephone Encounter (Signed)
Called and spoke to pt and relayed below recommendations. Pt voiced her understanding and had no additional questions.  Pt has pending OV on 12/01/18 with BQ. Nothing further is needed.

## 2018-11-13 ENCOUNTER — Other Ambulatory Visit: Payer: Self-pay | Admitting: *Deleted

## 2018-11-13 MED ORDER — METHADONE HCL 10 MG PO TABS: 20.0000 mg | ORAL_TABLET | Freq: Four times a day (QID) | ORAL | 0 refills | Status: DC

## 2018-11-13 NOTE — Telephone Encounter (Signed)
Name of Medication: Methadone Name of Pharmacy: [facility] or Written Date and Quantity: 10-06-18 #240 Last Office Visit and Type: [facility] 09-18-18 Next Office Visit and Type: 12-21-18 Last Controlled Substance Agreement Date: 07-14-18 Last UDS: 07-14-18

## 2018-11-16 ENCOUNTER — Telehealth: Payer: Self-pay | Admitting: Pulmonary Disease

## 2018-11-16 NOTE — Telephone Encounter (Signed)
PA request received from [employer]. Form filled out and faxed back to [employer] at [phone].   Number to call to check status is [phone].  Routing to myself for follow-up.

## 2018-11-17 ENCOUNTER — Encounter: Payer: PPO | Admitting: Internal Medicine

## 2018-11-23 NOTE — Telephone Encounter (Signed)
Please disregard previous message- another fax has come from [employer] stating that there has been a redetermination on pt's Stiolto and that this has been approved through 10/28/2019.  Nothing further needed at this time- will close encounter.

## 2018-11-23 NOTE — Telephone Encounter (Signed)
PA for Stiolto was denied- states that pt must try and fail at least three of the following preferred alternatives:Advair, Anoro, Breo, Combivent, Serevent, Spiriva, and Symbicort.   Pt has tried and failed Spiriva and Serevent- must try and fail 1 more preferred alternative before Stiolto will be covered.    BQ please advise on preferred alternative.  Thanks!

## 2018-11-25 ENCOUNTER — Encounter: Payer: Self-pay | Admitting: *Deleted

## 2018-11-25 ENCOUNTER — Other Ambulatory Visit: Payer: Self-pay | Admitting: *Deleted

## 2018-11-25 NOTE — Patient Outreach (Addendum)
[facility]) Care Management  11/25/2018  XARENI KELCH Jul 10, 1956 [id number]   RN Health Coach telephone call to patient.  Hipaa compliance verified. Per patient this is not a good time. Patient requested that [place] call back on cell phone later this evening,   Plan: [place] will attempt call back this evening.  [facility]) Care Management  11/25/2018  4:50 pm  REKHA HOBBINS June 27, 1956 [id number]  RN Health Coach telephone call to patient cell phone as patient requested.  Hipaa compliance voicemail left with return call back number.  [facility] Care Management [phone]

## 2018-12-01 ENCOUNTER — Ambulatory Visit (INDEPENDENT_AMBULATORY_CARE_PROVIDER_SITE_OTHER): Payer: PPO | Admitting: Pulmonary Disease

## 2018-12-01 ENCOUNTER — Telehealth: Payer: Self-pay | Admitting: Pulmonary Disease

## 2018-12-01 ENCOUNTER — Encounter: Payer: Self-pay | Admitting: Pulmonary Disease

## 2018-12-01 ENCOUNTER — Other Ambulatory Visit: Payer: Self-pay | Admitting: *Deleted

## 2018-12-01 VITALS — BP 124/64 | HR 88 | Ht 60.0 in | Wt 149.0 lb

## 2018-12-01 DIAGNOSIS — I272 Pulmonary hypertension, unspecified: Secondary | ICD-10-CM | POA: Diagnosis not present

## 2018-12-01 DIAGNOSIS — R0600 Dyspnea, unspecified: Secondary | ICD-10-CM | POA: Diagnosis not present

## 2018-12-01 DIAGNOSIS — J449 Chronic obstructive pulmonary disease, unspecified: Secondary | ICD-10-CM | POA: Diagnosis not present

## 2018-12-01 DIAGNOSIS — J9611 Chronic respiratory failure with hypoxia: Secondary | ICD-10-CM | POA: Diagnosis not present

## 2018-12-01 MED ORDER — BUDESONIDE 180 MCG/ACT IN AEPB: 2.0000 | INHALATION_SPRAY | Freq: Two times a day (BID) | RESPIRATORY_TRACT | 0 refills | Status: DC

## 2018-12-01 NOTE — Addendum Note (Signed)
Addended by: Len Blalock on: 12/01/2018 11:49 AM   Modules accepted: Orders

## 2018-12-01 NOTE — Patient Outreach (Signed)
[facility]) Care Management  12/01/2018  Lindsay Harmon 10/08/1956 [id number]   RN Health Coach attempted follow up outreach call to patient.  Patient was unavailable. HIPPA compliance voicemail message left with return callback number.  Plan: RN will call patient again within 30 days.  [place] [facility] Care Management [phone]

## 2018-12-01 NOTE — Telephone Encounter (Signed)
Instructions      Return in about 2 weeks (around 12/15/2018).  Worsening shortness of breath: We are going to your COPD more aggressively to see if this helps Start taking Pulmicort 2 puffs daily Continue Stiolto 2 puffs twice a day Continue albuterol as needed every 4 hours for shortness of breath We will check spirometry testing today, this helps assess your COPD We will check your oxygen level while walking  Chronic respiratory failure with hypoxemia: Continue using 5 to 6 L of oxygen continuously  Pulmonary hypertension: Continue taking Tyvaso 7 puffs 4 times a day If your shortness of breath has not improved in 2 weeks and when you return then we will need to check an echocardiogram and consider putting you on a medicine called ambrisentan 5 mg daily  We will see you back in 2 weeks with a nurse practitioner or sooner if needed     _____________________________________________________________________________________  Hulen Skains and spoke with patient she stated that she had a question about the 5-6 liters of o2 that states she needs to stay on. She normally is only on 4 liters. BQ please advise, thank you.

## 2018-12-01 NOTE — Telephone Encounter (Signed)
Called and spoke with patient, she is aware of BQ response and verbalized understanding. Nothing further needed.

## 2018-12-01 NOTE — Telephone Encounter (Signed)
When on concentrator at home she can use 4 but really needs to turn up to 6 with walking. Out of the house needs 5L pulse (as high as her concentrator can go).  We have discussed using tanks in past because in reality she could benefit from more, but she has been reluctant.

## 2018-12-01 NOTE — Progress Notes (Signed)
Subjective:    Patient ID: Lindsay Harmon, female    DOB: [birth year], 63 y.o.   MRN: [id number]   Synopsis: Referred by cardiology in 2016 for evaluation of severe COPD and possible obstructive sleep apnea in the setting of pulmonary hypertension. She also has congestive heart failure. A sleep study in 2016 showed no evidence of obstructive sleep apnea but she does have significant nocturnal hypoxemia. Spirometry testing in 2016 confirmed a diagnosis of COPD with severe airflow obstruction FEV1 41% pred.  She smoked cigarettes for many years and she quit smoking in 2013 after smoking 2 ppd for 40 years.  In 2019 she took sildenafil and her oxygenation worsened while hospitalized.  We ultimately started Tyvaso.  HPI Chief Complaint  Patient presents with  . Follow-up    pt c/o sudden increased sob with no known cause, using neb more frequently which helps.     Charlei says that she has been having spells of her breathing worsening lately.  She says that she will suddenly feel short of breath and she will need to take albuterol, this helps.  This all started the day after she switched from 8 puffs of Tyvaso to 7 puffs.  She says that when she took 8 puffs of tyvaso she had worsening wheezing.  She has been taking 7 puffs four times a day since then.  She says that the dyspnea is quite severe when it occurs.  She previously had a cough with mucus production, she doesn't have that anymore.     Past Medical History:  Diagnosis Date  . Allergy   . Aortic atherosclerosis ([place])   . Arthritis   . CKD (chronic kidney disease), stage IV ([place])   . COPD (chronic obstructive pulmonary disease) ([place])    a. 11/2014 PFT's: FEV1 0.87 (38%), FVC 1.41 (48%), FEF 25-75 0.41 (19%). Unable to perform DLCO; b. 12/2017 Simple spirometry ration 67%. FEV1 1.21 L+53% predicted. FVC 1.79L 61% predicted.  . Coronary artery calcification seen on CT scan    a. 03/2017 CT Chest.  . Depression   . Diabetic neuropathy ([place])    . Esophageal stricture   . Familial hematuria   . GERD (gastroesophageal reflux disease)   . Hyperlipidemia   . Hypertension   . IBS (irritable bowel syndrome)   . Nephrolithiasis   . NIDDM (non-insulin dependent diabetes mellitus)   . Obesity, unspecified   . Pulmonary hypertension ([place])    a. WHO Group 3; b. 10/2016 Echo: EF 55-60%, no rwma, Gr1 DD, mild to mod AS, mean grad 22mHg, mildly dil RV w/ mildly reduced RV fxn, mildly dil RA. PASP 1133mg; c. 12/2017 RHC: RA 11, RV 92/12, PA 92/33(57), PCWP 15, Fick CO/CI 5.9/3.3. PVR 7.2 WU. Ao 93%, PA 62/64%.  . Marland KitchenVD (peripheral vascular disease) ([place]      Review of Systems  Constitutional: Positive for fatigue. Negative for diaphoresis and fever.  HENT: Negative for postnasal drip, rhinorrhea and sinus pressure.   Respiratory: Negative for cough, shortness of breath and wheezing.   Cardiovascular: Negative for chest pain, palpitations and leg swelling.       Objective:   Physical Exam  Vitals:   12/01/18 1057  BP: 124/64  Pulse: 88  SpO2: 91%  Weight: 149 lb (67.6 kg)  Height: 5' (1.524 m)   5L [place] pulse (dropped to the 70's while walking on this)  Gen: chronically ill appearing HENT: OP clear, TM's clear, neck supple PULM: Wheezing today B, normal  percussion CV: RRR, no mgr, trace edema GI: BS+, soft, nontender Derm: no cyanosis or rash Psyche: normal mood and affect     CBC    Component Value Date/Time   WBC 9.0 07/28/2018 1054   RBC 5.35 (H) 07/28/2018 1054   HGB 15.5 07/28/2018 1054   HCT 44.9 07/28/2018 1054   PLT 187 07/28/2018 1054   MCV 83.8 07/28/2018 1054   MCH 29.0 07/28/2018 1054   MCHC 34.6 07/28/2018 1054   RDW 14.9 (H) 07/28/2018 1054   LYMPHSABS 1.4 07/28/2018 1054   MONOABS 0.6 07/28/2018 1054   EOSABS 0.5 07/28/2018 1054   BASOSABS 0.1 07/28/2018 1054   Echo: December 2018 echocardiogram showed a normal LVEF RV was mildly dilated with an RVSP greater than 100 June 2019 transthoracic  echocardiogram normal LVEF, RVSP estimate 115 mmHg October 2019 transthoracic echocardiogram normal LVEF RVSP estimate 90 mmHg  RHC 12/05/2014 RA 12 mmHg (mean) with O2 sat 72%; RV 97/16 mmHg with O2 sat 73%; PA 97/30 mmHg with O2 sat 67%; PCWP(mean) 14 mmHg (mean); Cardiac Output 5.78 L/min Right heart catheterization March 2019: PA pressure 92/33, mean 57, pulmonary vascular resistance 7.2, cardiac output  5.9, pulmonary capillary wedge pressure 15.  Chest imaging: CT scan chest June 2018 images independently reviewed showing mild to moderate centrilobular emphysema in the upper lobes, some mosaicism in the bases. Feb 2016 V/Q IMPRESSION: No evidence of pulmonary embolism.   Pulmonary function testing: March 2019 simple spirometry ratio 67%, FEV1 1.21 L 53% predicted, FVC 1.79 L 61% predicted  Records from her most recent visit with our nurse practitioner on November 03, 2018 reviewed where she was recommended to continue taking Tyvaso 8 puffs in the morning, 7 puffs 3 times daily afterwards.  Unfortunately she has only been able to tolerate 7 puffs 4 times daily at this point     Assessment & Plan:   Chronic obstructive pulmonary disease, unspecified COPD type ([place]) - Plan: Spirometry with graph  Pulmonary hypertension ([place])  Chronic respiratory failure with hypoxia ([place])  Dyspnea, unspecified type  Discussion: Kadija has been feeling a little worse recently with increasing shortness of breath which is relieved by albuterol.  She remains a complicated case so it is difficult to say for certain what is causing her episodic shortness of breath (either COPD or pulmonary hypertension).  She is wheezing on exam today so hopefully it is just COPD.  She was intolerant of sildenafil when used previously for her pulmonary hypertension.  She is unable to go any higher than 7 puffs 4 times a day on Tyvaso because this causes increasing airway symptoms such as wheezing.  Today I would like to  maximize her COPD treatment to see if this makes these dyspnea episodes go away.  However, if she is not feeling better when she comes back in a couple weeks were going to need to consider ramping up her pulmonary hypertension treatment.  Plan: Worsening shortness of breath: We are going to your COPD more aggressively to see if this helps Start taking Pulmicort 2 puffs daily Continue Stiolto 2 puffs twice a day Continue albuterol as needed every 4 hours for shortness of breath We will check spirometry testing today, this helps assess your COPD We will check your oxygen level while walking  Chronic respiratory failure with hypoxemia: Continue using 5 to 6 L of oxygen continuously  Pulmonary hypertension: Continue taking Tyvaso 7 puffs 4 times a day If your shortness of breath has not improved in  2 weeks and when you return then we will need to check an echocardiogram and consider putting you on a medicine called ambrisentan 5 mg daily  We will see you back in 2 weeks with a nurse practitioner or sooner if needed  Current Outpatient Medications:  .  albuterol (PROVENTIL) (2.5 MG/3ML) 0.083% nebulizer solution, Take 3 mLs (2.5 mg total) by nebulization every 6 (six) hours as needed for wheezing or shortness of breath., Disp: 360 mL, Rfl: 5 .  albuterol (VENTOLIN HFA) 108 (90 Base) MCG/ACT inhaler, USE 2 PUFFS EVERY SIX HOURS AS NEEDED FOR WHEEZING OR SHORTNESS OF BREATH, Disp: 18 g, Rfl: 5 .  ALPRAZolam (XANAX) 0.25 MG tablet, Take 1 tablet (0.25 mg total) by mouth daily as needed for anxiety., Disp: 30 tablet, Rfl: 0 .  aspirin 325 MG tablet, Take 325 mg by mouth at bedtime. , Disp: , Rfl:  .  calcitRIOL (ROCALTROL) 0.5 MCG capsule, Take 0.5 mcg by mouth daily. , Disp: , Rfl:  .  cholecalciferol (VITAMIN D) 1000 units tablet, Take 1,000 Units by mouth at bedtime. , Disp: , Rfl:  .  ferrous sulfate 325 (65 FE) MG tablet, Take 325 mg by mouth daily with breakfast., Disp: , Rfl:  .  gabapentin  (NEURONTIN) 300 MG capsule, Take 600 mg by mouth at bedtime., Disp: , Rfl:  .  glucose blood (ONE TOUCH ULTRA TEST) test strip, USE 1 STRIP TO TEST BLOOD SUGAR ONCE DAILY Dx Code E11.51, Disp: 100 each, Rfl: 3 .  hydrocortisone 2.5 % cream, Apply topically 3 (three) times daily as needed., Disp: 28 g, Rfl: 3 .  Insulin Glargine (LANTUS SOLOSTAR) 100 UNIT/ML Solostar Pen, Inject 20 Units into the skin at bedtime., Disp: 9 pen, Rfl: 5 .  Insulin Pen Needle (SURE COMFORT PEN NEEDLES) 31G X 5 MM MISC, USE 1 PEN NEEDLE TO INJECT INSULIN, Disp: 90 each, Rfl: 3 .  loratadine (CLARITIN) 10 MG tablet, Take 10 mg by mouth 2 (two) times daily., Disp: , Rfl:  .  lovastatin (MEVACOR) 40 MG tablet, Take 2 tablets (80 mg total) by mouth at bedtime., Disp: 180 tablet, Rfl: 3 .  methadone (DOLOPHINE) 10 MG tablet, Take 2 tablets (20 mg total) by mouth 4 (four) times daily., Disp: 240 tablet, Rfl: 0 .  metoprolol tartrate (LOPRESSOR) 25 MG tablet, Take 1/2 tablet by mouth twice a day, Disp: 90 tablet, Rfl: 3 .  nitroGLYCERIN (NITROSTAT) 0.4 MG SL tablet, Place 1 tablet (0.4 mg total) under the tongue every 5 (five) minutes as needed for chest pain., Disp: 100 tablet, Rfl: 1 .  ondansetron (ZOFRAN ODT) 4 MG disintegrating tablet, Take 1 tablet (4 mg total) by mouth every 8 (eight) hours as needed for nausea or vomiting., Disp: 20 tablet, Rfl: 0 .  pantoprazole (PROTONIX) 40 MG tablet, Take 1 tablet by mouth twice a day, Disp: 180 tablet, Rfl: 3 .  Tiotropium Bromide-Olodaterol (STIOLTO RESPIMAT) 2.5-2.5 MCG/ACT AERS, Inhale 2 puffs into the lungs daily., Disp: 4 g, Rfl: 5 .  Treprostinil (TYVASO) 0.6 MG/ML SOLN, Inhale 18 mcg into the lungs 4 (four) times daily., Disp: , Rfl:  .  budesonide (PULMICORT FLEXHALER) 180 MCG/ACT inhaler, Inhale 2 puffs into the lungs 2 (two) times daily., Disp: 1 Inhaler, Rfl: 0 .  torsemide (DEMADEX) 20 MG tablet, Take 2 tablets (40 mg total) by mouth daily., Disp: 180 tablet, Rfl:  3

## 2018-12-01 NOTE — Patient Instructions (Signed)
Worsening shortness of breath: We are going to your COPD more aggressively to see if this helps Start taking Pulmicort 2 puffs daily Continue Stiolto 2 puffs twice a day Continue albuterol as needed every 4 hours for shortness of breath We will check spirometry testing today, this helps assess your COPD We will check your oxygen level while walking  Chronic respiratory failure with hypoxemia: Continue using 5 to 6 L of oxygen continuously  Pulmonary hypertension: Continue taking Tyvaso 7 puffs 4 times a day If your shortness of breath has not improved in 2 weeks and when you return then we will need to check an echocardiogram and consider putting you on a medicine called ambrisentan 5 mg daily  We will see you back in 2 weeks with a nurse practitioner or sooner if needed

## 2018-12-02 ENCOUNTER — Other Ambulatory Visit: Payer: Self-pay | Admitting: Internal Medicine

## 2018-12-03 DIAGNOSIS — J439 Emphysema, unspecified: Secondary | ICD-10-CM | POA: Diagnosis not present

## 2018-12-07 ENCOUNTER — Other Ambulatory Visit: Payer: Self-pay | Admitting: Internal Medicine

## 2018-12-11 ENCOUNTER — Other Ambulatory Visit: Payer: Self-pay | Admitting: Internal Medicine

## 2018-12-11 ENCOUNTER — Other Ambulatory Visit (HOSPITAL_COMMUNITY): Payer: Self-pay | Admitting: *Deleted

## 2018-12-11 MED ORDER — TORSEMIDE 20 MG PO TABS: 40.0000 mg | ORAL_TABLET | Freq: Every day | ORAL | 3 refills | Status: DC

## 2018-12-11 NOTE — Telephone Encounter (Signed)
Name of Medication: Methadone Name of Pharmacy: [facility] or Written Date and Quantity: 11-13-18 #240 Last Office Visit and Type: 3 month f/u 09-18-18 Next Office Visit and Type: 3 month f/u 12-21-18 Last Controlled Substance Agreement Date: 07-14-18 Last UDS: 07-14-18

## 2018-12-15 ENCOUNTER — Other Ambulatory Visit: Payer: Self-pay | Admitting: *Deleted

## 2018-12-15 ENCOUNTER — Other Ambulatory Visit: Payer: Self-pay

## 2018-12-15 ENCOUNTER — Encounter: Payer: Self-pay | Admitting: Oncology

## 2018-12-15 ENCOUNTER — Inpatient Hospital Stay: Payer: PPO | Attending: Oncology

## 2018-12-15 ENCOUNTER — Inpatient Hospital Stay (HOSPITAL_BASED_OUTPATIENT_CLINIC_OR_DEPARTMENT_OTHER): Payer: PPO | Admitting: Oncology

## 2018-12-15 VITALS — BP 148/81 | HR 90 | Temp 97.2°F | Resp 18 | Wt 152.4 lb

## 2018-12-15 DIAGNOSIS — E871 Hypo-osmolality and hyponatremia: Secondary | ICD-10-CM

## 2018-12-15 DIAGNOSIS — J449 Chronic obstructive pulmonary disease, unspecified: Secondary | ICD-10-CM | POA: Insufficient documentation

## 2018-12-15 DIAGNOSIS — Z87891 Personal history of nicotine dependence: Secondary | ICD-10-CM | POA: Insufficient documentation

## 2018-12-15 DIAGNOSIS — D751 Secondary polycythemia: Secondary | ICD-10-CM

## 2018-12-15 DIAGNOSIS — J9611 Chronic respiratory failure with hypoxia: Secondary | ICD-10-CM | POA: Diagnosis not present

## 2018-12-15 DIAGNOSIS — N184 Chronic kidney disease, stage 4 (severe): Secondary | ICD-10-CM

## 2018-12-15 DIAGNOSIS — Z9981 Dependence on supplemental oxygen: Secondary | ICD-10-CM | POA: Diagnosis not present

## 2018-12-15 LAB — CBC WITH DIFFERENTIAL/PLATELET
Abs Immature Granulocytes: 0.04 10*3/uL (ref 0.00–0.07)
Basophils Absolute: 0.1 10*3/uL (ref 0.0–0.1)
Basophils Relative: 1 %
EOS ABS: 0.5 10*3/uL (ref 0.0–0.5)
Eosinophils Relative: 5 %
HCT: 47.5 % — ABNORMAL HIGH (ref 36.0–46.0)
Hemoglobin: 15.2 g/dL — ABNORMAL HIGH (ref 12.0–15.0)
Immature Granulocytes: 0 %
Lymphocytes Relative: 18 %
Lymphs Abs: 1.6 10*3/uL (ref 0.7–4.0)
MCH: 29.2 pg (ref 26.0–34.0)
MCHC: 32 g/dL (ref 30.0–36.0)
MCV: 91.2 fL (ref 80.0–100.0)
Monocytes Absolute: 0.6 10*3/uL (ref 0.1–1.0)
Monocytes Relative: 7 %
Neutro Abs: 6.2 10*3/uL (ref 1.7–7.7)
Neutrophils Relative %: 69 %
Platelets: 201 10*3/uL (ref 150–400)
RBC: 5.21 MIL/uL — ABNORMAL HIGH (ref 3.87–5.11)
RDW: 13 % (ref 11.5–15.5)
WBC: 9 10*3/uL (ref 4.0–10.5)
nRBC: 0 % (ref 0.0–0.2)

## 2018-12-15 NOTE — Progress Notes (Signed)
Hematology/Oncology follow up note [facility] Telephone:([phone] [phone]   Patient Care Team: Venia Carbon, MD as PCP - General Bensimhon, Shaune Pascal, MD as PCP - Cardiology (Cardiology) Bensimhon, Shaune Pascal, MD as Consulting Physician (Cardiology) Juanito Doom, MD as Consulting Physician (Pulmonary Disease) Anthonette Legato, MD as Consulting Physician (Internal Medicine) Lupita Raider, DO as Consulting Physician (Optometry) Pleasant, Eppie Gibson, RN as [place] Management  REFERRING PROVIDER: Dr.Lateef REASON FOR VISIT Follow up for treatment of hyponatremia and hypotonic  HISTORY OF PRESENTING ILLNESS:  Lindsay Harmon is a  63 y.o.  female with PMH listed below who was referred to me for evaluation of hyponatremia.  Patient has history of chronic kidney disease stage IV, proteinuria, hyponatremia lower extremity edema, secondary hyperparathyroidism and anemia of chronic disease follows up with Dr. Holley Raring.  Extensive medical records review was performed.  Patient has a history of hyponatremia and has been maintained on torsemide and fluid restriction.  Recent serum sodium has been stable. Reviewed patient's previous lab results since 2008 in [place].  She has a history of chronic hyponatremia in 2016 with sodium level running low around 120s.  Hyponatremia was considered to be secondary to volume overload/CHF compounded by Aldactone. Patient was transitioned to Lasix and then later switched to Torsemide  Sodium level appears to improve in 2017 and 2018 with average level around 1 30-1 35.  This year patient's hyponatremia has been well controlled with all readings above 130.  She has chronic respiratory failure and on home oxygen. She reports feeling fatigue, which is chronic for her.   INTERVAL HISTORY TARAH BUBOLTZ is a 63 y.o. female who has above history reviewed by me today presents for follow up visit for  management of erythrocytosis. Reports no new complaints.  She had COPD flare with worsening shortness of breath, recently and has been seen by pulmonologist.  Started on new inhaler.   Reports wheezing is getting better. Otherwise no new complaints.  Continue use of oxygen for chronic respiratory failure. Shortness of breath worsened by exertion.  Denies any fever, chills, leg swelling, chest pain.  Review of Systems  Constitutional: Positive for malaise/fatigue. Negative for chills, fever and weight loss.  HENT: Negative for nosebleeds and sore throat.   Eyes: Negative for double vision, photophobia and redness.  Respiratory: Positive for shortness of breath. Negative for cough, hemoptysis and wheezing.   Cardiovascular: Negative for chest pain, palpitations, orthopnea and leg swelling.  Gastrointestinal: Negative for abdominal pain, blood in stool, nausea and vomiting.  Genitourinary: Negative for dysuria.  Musculoskeletal: Negative for back pain, myalgias and neck pain.  Skin: Negative for itching and rash.  Neurological: Negative for dizziness, tingling and tremors.  Endo/Heme/Allergies: Negative for environmental allergies. Does not bruise/bleed easily.  Psychiatric/Behavioral: Negative for depression and hallucinations.    MEDICAL HISTORY:  Past Medical History:  Diagnosis Date  . Allergy   . Aortic atherosclerosis ([place])   . Arthritis   . CKD (chronic kidney disease), stage IV ([place])   . COPD (chronic obstructive pulmonary disease) ([place])    a. 11/2014 PFT's: FEV1 0.87 (38%), FVC 1.41 (48%), FEF 25-75 0.41 (19%). Unable to perform DLCO; b. 12/2017 Simple spirometry ration 67%. FEV1 1.21 L+53% predicted. FVC 1.79L 61% predicted.  . Coronary artery calcification seen on CT scan    a. 03/2017 CT Chest.  . Depression   . Diabetic neuropathy ([place])   . Esophageal stricture   . Familial hematuria   .  GERD (gastroesophageal reflux disease)   . Hyperlipidemia   . Hypertension   . IBS  (irritable bowel syndrome)   . Nephrolithiasis   . NIDDM (non-insulin dependent diabetes mellitus)   . Obesity, unspecified   . Pulmonary hypertension ([place])    a. WHO Group 3; b. 10/2016 Echo: EF 55-60%, no rwma, Gr1 DD, mild to mod AS, mean grad 5mHg, mildly dil RV w/ mildly reduced RV fxn, mildly dil RA. PASP 1134mg; c. 12/2017 RHC: RA 11, RV 92/12, PA 92/33(57), PCWP 15, Fick CO/CI 5.9/3.3. PVR 7.2 WU. Ao 93%, PA 62/64%.  . Marland KitchenVD (peripheral vascular disease) ([place]    SURGICAL HISTORY: Past Surgical History:  Procedure Laterality Date  . ABDOMINAL HYSTERECTOMY    . CARPAL TUNNEL RELEASE    . CESAREAN SECTION    . ERCP N/A 03/17/2017   Procedure: ENDOSCOPIC RETROGRADE CHOLANGIOPANCREATOGRAPHY (ERCP);  Surgeon: WoLucilla LameMD;  [facility];  Service: Endoscopy;  Laterality: N/A;  . EUS N/A 03/13/2017   Procedure: FULL UPPER ENDOSCOPIC ULTRASOUND (EUS) RADIAL;  Surgeon: Burbridge, ReMurray HodgkinsMD;  Location: [facility] ENDOSCOPY;  Service: Endoscopy;  Laterality: N/A;  . RIGHT HEART CATH N/A 01/22/2018   Procedure: RIGHT HEART CATH;  Surgeon: BeJolaine ArtistMD;  [facility];  Service: Cardiovascular;  Laterality: N/A;  . RIGHT HEART CATHETERIZATION N/A 12/05/2014   Procedure: RIGHT HEART CATH;  Surgeon: HeSinclair GroomsMD;  [facility];  Service: Cardiovascular;  Laterality: N/A;    SOCIAL HISTORY: Social History   Socioeconomic History  . Marital status: Legally Separated    Spouse name: Not on file  . Number of children: 1  . Years of education: Not on file  . Highest education level: Not on file  Occupational History  . 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 Financial resource strain: Not on file  . Food insecurity:    Worry: Not on file    Inability: Not on file  . Transportation needs:    Medical: Not on file    Non-medical: Not on file  Tobacco Use  . Smoking status: Former  Smoker    Packs/day: 1.50    Years: 33.00    Pack years: 49.50    Types: Cigarettes    Last attempt to quit: 07/28/2005    Years since quitting: 13.3  . Smokeless tobacco: Never Used  Substance and Sexual Activity  . Alcohol use: No    Alcohol/week: 0.0 standard drinks    Comment: heavy in the past  . Drug use: No  . Sexual activity: Never  Lifestyle  . Physical activity:    Days per week: Not on file    Minutes per session: Not on file  . Stress: Not on file  Relationships  . Social connections:    Talks on phone: Not on file    Gets together: Not on file    Attends religious service: Not on file    Active member of club or organization: Not on file    Attends meetings of clubs or organizations: Not on file    Relationship status: Not on file  . Intimate partner violence:    Fear of current or ex partner: Not on file    Emotionally abused: Not on file    Physically abused: Not on file    Forced sexual activity: Not on file  Other Topics Concern  . Not on  file  Social History Narrative   No living will   Son [name] (then mom) should make decisions for her if she is unable.    Would accept resuscitation attempts but no prolonged ventilation   Not sure about tube feeds but probably wouldn't want them if cognitively unaware    FAMILY HISTORY: Family History  Problem Relation Age of Onset  . Diabetes Mother   . Cancer Mother        ovarian,melanoma  . Allergies Father   . Cancer Father        lung  . Cancer Maternal Grandmother        uterine  . Emphysema Paternal Grandfather   . Allergies Sister   . Breast cancer Neg Hx     ALLERGIES:  is allergic to sertraline hcl.  MEDICATIONS:  Current Outpatient Medications  Medication Sig Dispense Refill  . albuterol (PROVENTIL) (2.5 MG/3ML) 0.083% nebulizer solution Take 3 mLs (2.5 mg total) by nebulization every 6 (six) hours as needed for wheezing or shortness of breath. 360 mL 5  . albuterol (VENTOLIN HFA) 108 (90  Base) MCG/ACT inhaler USE 2 PUFFS EVERY SIX HOURS AS NEEDED FOR WHEEZING OR SHORTNESS OF BREATH 18 g 5  . ALPRAZolam (XANAX) 0.25 MG tablet Take 1 tablet (0.25 mg total) by mouth daily as needed for anxiety. 30 tablet 0  . aspirin 325 MG tablet Take 325 mg by mouth at bedtime.     . budesonide (PULMICORT FLEXHALER) 180 MCG/ACT inhaler Inhale 2 puffs into the lungs 2 (two) times daily. 1 Inhaler 0  . calcitRIOL (ROCALTROL) 0.5 MCG capsule Take 0.5 mcg by mouth daily.     . cholecalciferol (VITAMIN D) 1000 units tablet Take 1,000 Units by mouth at bedtime.     . ferrous sulfate 325 (65 FE) MG tablet Take 325 mg by mouth daily with breakfast.    . gabapentin (NEURONTIN) 300 MG capsule Take 600 mg by mouth at bedtime.    Marland Kitchen glucose blood (ONE TOUCH ULTRA TEST) test strip USE 1 STRIP TO TEST BLOOD SUGAR ONCE DAILY Dx Code E11.51 100 each 3  . hydrocortisone 2.5 % cream Apply topically 3 (three) times daily as needed. 28 g 3  . Insulin Glargine (LANTUS SOLOSTAR) 100 UNIT/ML Solostar Pen Inject 20 Units into the skin at bedtime. 9 pen 5  . Insulin Pen Needle (SURE COMFORT PEN NEEDLES) 31G X 5 MM MISC USE 1 PEN NEEDLE TO INJECT INSULIN 90 each 3  . loratadine (CLARITIN) 10 MG tablet Take 10 mg by mouth 2 (two) times daily.    Marland Kitchen lovastatin (MEVACOR) 40 MG tablet Take 2 tablets by mouth at bedtime 180 tablet 3  . methadone (DOLOPHINE) 10 MG tablet TAKE 2 TABLETS BY MOUTH 4 TIMES A DAY AS DIRECTED. 240 tablet 0  . metoprolol tartrate (LOPRESSOR) 25 MG tablet Take 1/2 tablet by mouth twice a day 90 tablet 3  . nitroGLYCERIN (NITROSTAT) 0.4 MG SL tablet Place 1 tablet (0.4 mg total) under the tongue every 5 (five) minutes as needed for chest pain. 100 tablet 1  . ondansetron (ZOFRAN ODT) 4 MG disintegrating tablet Take 1 tablet (4 mg total) by mouth every 8 (eight) hours as needed for nausea or vomiting. 20 tablet 0  . pantoprazole (PROTONIX) 40 MG tablet Take 1 tablet by mouth twice a day 180 tablet 3  .  Tiotropium Bromide-Olodaterol (STIOLTO RESPIMAT) 2.5-2.5 MCG/ACT AERS Inhale 2 puffs into the lungs daily. 4 g 5  .  torsemide (DEMADEX) 20 MG tablet Take 2 tablets (40 mg total) by mouth daily. 180 tablet 3  . Treprostinil (TYVASO) 0.6 MG/ML SOLN Inhale 18 mcg into the lungs 4 (four) times daily.    . valACYclovir (VALTREX) 1000 MG tablet Take 1,000 mg by mouth daily as needed.  3   No current facility-administered medications for this visit.      PHYSICAL EXAMINATION: ECOG PERFORMANCE STATUS: 2 - Symptomatic, <50% confined to bed Vitals:   12/15/18 1018  BP: (!) 148/81  Pulse: 90  Resp: 18  Temp: (!) 97.2 F (36.2 C)  SpO2: 91%   Filed Weights   12/15/18 1018  Weight: 152 lb 6.4 oz (69.1 kg)    Physical Exam Constitutional:      General: She is not in acute distress.    Appearance: She is ill-appearing.  HENT:     Head: Normocephalic and atraumatic.  Eyes:     General: No scleral icterus.    Pupils: Pupils are equal, round, and reactive to light.  Neck:     Musculoskeletal: Normal range of motion and neck supple.  Cardiovascular:     Rate and Rhythm: Normal rate and regular rhythm.     Heart sounds: Murmur present.  Pulmonary:     Effort: Pulmonary effort is normal. No respiratory distress.     Breath sounds: No wheezing.  Abdominal:     General: Bowel sounds are normal. There is no distension.     Palpations: Abdomen is soft. There is no mass.     Tenderness: There is no abdominal tenderness.  Musculoskeletal: Normal range of motion.        General: No deformity.  Skin:    General: Skin is warm and dry.     Findings: No erythema or rash.  Neurological:     Mental Status: She is alert and oriented to person, place, and time.     Cranial Nerves: No cranial nerve deficit.     Coordination: Coordination normal.  Psychiatric:        Behavior: Behavior normal.        Thought Content: Thought content normal.      LABORATORY DATA:  I have reviewed the data as  listed Lab Results  Component Value Date   WBC 9.0 12/15/2018   HGB 15.2 (H) 12/15/2018   HCT 47.5 (H) 12/15/2018   MCV 91.2 12/15/2018   PLT 201 12/15/2018   Recent Labs    04/23/18 0619 04/24/18 0441 07/28/18 1054  NA 135 134* 139  K 4.2 4.8 3.7  CL 98 97* 94*  CO2 27 21* 32  GLUCOSE 129* 70 181*  BUN 46* 53* 27*  CREATININE 2.16* 2.37* 1.76*  CALCIUM 8.8* 9.3 10.0  GFRNONAA 23* 21* 29*  GFRAA 27* 24* 34*  PROT 6.5 7.0 7.6  ALBUMIN 3.0* 3.4* 4.1  AST 545* 574* 25  ALT 699* 679* 14  ALKPHOS 86 99 61  BILITOT 1.5* 3.1* 0.6   Iron/TIBC/Ferritin/ %Sat    Component Value Date/Time   IRON 135 07/28/2018 1054   TIBC 310 07/28/2018 1054   FERRITIN 78 07/28/2018 1054   IRONPCTSAT 44 (H) 07/28/2018 1054     RADIOGRAPHIC STUDIES: I have personally reviewed the radiological images as listed and agreed with the findings in the report. CT chest lung cancer screening 04/13/2018  1. Lung-RADS 2, benign appearance or behavior. Continue annual screening with low-dose chest CT without contrast in 12 months. 2. Age advanced coronary artery atherosclerosis. Recommend  assessment of coronary risk factors and consideration of medical Therapy. 3. Aortic atherosclerosis (ICD10-I70.0) and emphysema (ICD10-J43.9). 4. Aortic valvular calcifications. Consider echocardiography to evaluate for valvular dysfunction. 5. Pulmonary artery enlargement suggests pulmonary arterial hypertension.  ASSESSMENT & PLAN:  1. Erythrocytosis   2. Chronic respiratory failure with hypoxia ([place])   #Labs are reviewed and discussed with patient.  Hemoglobin slightly elevated above 15.  Erythropoietin level pending. Ultrasound abdomen which was independently reviewed by me and discussed with patient.  Small kidneys with increased echogenicity consistent with CKD.  Mobile gallstone without wall thickening or biliary ductal dilation. Chronic erythrocytosis, likely secondary to chronic hypoxia due to respiratory  failure. Discussed with patient that this is a compensation process due to low oxygen level and I do not recommend phlebotomy.  Follow-up in 6 months for MD and lab assessment. Orders Placed This Encounter  Procedures  . CBC with Differential/Platelet    Standing Status:   Future    Standing Expiration Date:   12/16/2019    All questions were answered. The patient knows to call the clinic with any problems questions or concerns. Earlie Server, MD, PhD Hematology Oncology [facility] Pager- [phone] 12/15/2018

## 2018-12-15 NOTE — Patient Outreach (Signed)
[facility]) Care Management  12/15/2018  Lindsay Harmon 1956-04-19 [id number]  RN Health Coach attempted follow up outreach call to patient.  Patient was unavailable. HIPPA compliance voicemail message left with return callback number.  Plan: RN will call patient again within 14 days.  [place] Care Management [phone]

## 2018-12-15 NOTE — Progress Notes (Signed)
_0  ID: Lindsay Harmon, female    DOB: 1956-05-13, 63 y.o.   MRN: [id number]  Chief Complaint  Patient presents with  . Follow-up    breathing better, wheezing better,    Referring provider: Venia Carbon, MD  Synopsis: Referred by cardiology in 2016 for evaluation of severe COPD and possible obstructive sleep apnea in the setting of pulmonary hypertension. She also has congestive heart failure. A sleep study in 2016 showed no evidence of obstructive sleep apnea but she does have significant nocturnal hypoxemia. Spirometry testing in 2016 confirmed a diagnosis of COPD with severe airflow obstruction FEV1 41% pred.  She smoked cigarettes for many years and she quit smoking in 2013 after smoking 2 ppd for 40 years. In 2019 she took sildenafil and her oxygenation worsened while hospitalized.  We ultimately started Tyvaso.  HPI: 63 year old female, former smoker quit in 2006 (50 pack year hx). PMH significant for COPD, pulmonary hypertension. Patient of Dr. Lake Bells, last seen on 12/01/18. Maintained on Stiolto and Tyvaso. She is unable to go any higher than 7 puffs QID on Tyvaso because this causes increased airway symptoms such as wheezing. Per Dr. Anastasia Pall note he would like to maximize COPD treatment to see if this makes nay difference in dyspnea symptoms. Started on Pulmicort 2 puffs daily, continues Stiolto twice daily and prn albuterol. If this does not improve will need to check echocardiogram and consider ramping up her pulmonary hypertension treatment by starting ambrisentan 36m daily.   12/16/2018 Patient presents today for 2 week follow-up visit. Feels her breathing and wheezing have somewhat improved. States that she actually feels a lot better. Took pulmicort today. Needs RX for prescription. Uses rolling walker, does most of her own house work and shopping. Reports that she sometimes she has to stop when vacuuming but that it is not always due to her breathing, can be because of  her back pain. Denies cough.     Allergies  Allergen Reactions  . Sertraline Hcl Other (See Comments)    Cloudy thoughts, malaise    Immunization History  Administered Date(s) Administered  . Influenza Split 08/13/2011  . Influenza Whole 09/16/2007, 08/17/2009, 07/31/2010  . Influenza,inj,Quad PF,6+ Mos 07/12/2013, 08/09/2014, 08/10/2015, 07/23/2016, 07/23/2017, 06/19/2018  . Pneumococcal Conjugate-13 11/08/2014  . Pneumococcal Polysaccharide-23 08/05/2002, 02/27/2016  . Td 05/19/2006  . Tdap 04/26/2017  . Zoster 11/23/2014    Past Medical History:  Diagnosis Date  . Allergy   . Aortic atherosclerosis ([place]   . Arthritis   . CKD (chronic kidney disease), stage IV ([place]   . COPD (chronic obstructive pulmonary disease) ([place]    a. 11/2014 PFT's: FEV1 0.87 (38%), FVC 1.41 (48%), FEF 25-75 0.41 (19%). Unable to perform DLCO; b. 12/2017 Simple spirometry ration 67%. FEV1 1.21 L+53% predicted. FVC 1.79L 61% predicted.  . Coronary artery calcification seen on CT scan    a. 03/2017 CT Chest.  . Depression   . Diabetic neuropathy ([place]   . Esophageal stricture   . Familial hematuria   . GERD (gastroesophageal reflux disease)   . Hyperlipidemia   . Hypertension   . IBS (irritable bowel syndrome)   . Nephrolithiasis   . NIDDM (non-insulin dependent diabetes mellitus)   . Obesity, unspecified   . Pulmonary hypertension ([place]    a. WHO Group 3; b. 10/2016 Echo: EF 55-60%, no rwma, Gr1 DD, mild to mod AS, mean grad 134mg, mildly dil RV w/ mildly reduced RV fxn, mildly dil RA. PASP 11577m;  c. 12/2017 RHC: RA 11, RV 92/12, PA 92/33(57), PCWP 15, Fick CO/CI 5.9/3.3. PVR 7.2 WU. Ao 93%, PA 62/64%.  Marland Kitchen PVD (peripheral vascular disease) ([place])     Tobacco History: Social History   Tobacco Use  Smoking Status Former Smoker  . Packs/day: 1.50  . Years: 33.00  . Pack years: 49.50  . Types: Cigarettes  . Last attempt to quit: 07/28/2005  . Years since quitting: 13.3  Smokeless Tobacco  Never Used   Counseling given: Not Answered   Outpatient Medications Prior to Visit  Medication Sig Dispense Refill  . albuterol (PROVENTIL) (2.5 MG/3ML) 0.083% nebulizer solution Take 3 mLs (2.5 mg total) by nebulization every 6 (six) hours as needed for wheezing or shortness of breath. 360 mL 5  . albuterol (VENTOLIN HFA) 108 (90 Base) MCG/ACT inhaler USE 2 PUFFS EVERY SIX HOURS AS NEEDED FOR WHEEZING OR SHORTNESS OF BREATH 18 g 5  . ALPRAZolam (XANAX) 0.25 MG tablet Take 1 tablet (0.25 mg total) by mouth daily as needed for anxiety. 30 tablet 0  . aspirin 325 MG tablet Take 325 mg by mouth at bedtime.     . calcitRIOL (ROCALTROL) 0.5 MCG capsule Take 0.5 mcg by mouth daily.     . cholecalciferol (VITAMIN D) 1000 units tablet Take 1,000 Units by mouth at bedtime.     . ferrous sulfate 325 (65 FE) MG tablet Take 325 mg by mouth daily with breakfast.    . gabapentin (NEURONTIN) 300 MG capsule Take 600 mg by mouth at bedtime.    Marland Kitchen glucose blood (ONE TOUCH ULTRA TEST) test strip USE 1 STRIP TO TEST BLOOD SUGAR ONCE DAILY Dx Code E11.51 100 each 3  . hydrocortisone 2.5 % cream Apply topically 3 (three) times daily as needed. 28 g 3  . Insulin Glargine (LANTUS SOLOSTAR) 100 UNIT/ML Solostar Pen Inject 20 Units into the skin at bedtime. 9 pen 5  . Insulin Pen Needle (SURE COMFORT PEN NEEDLES) 31G X 5 MM MISC USE 1 PEN NEEDLE TO INJECT INSULIN 90 each 3  . loratadine (CLARITIN) 10 MG tablet Take 10 mg by mouth 2 (two) times daily.    Marland Kitchen lovastatin (MEVACOR) 40 MG tablet Take 2 tablets by mouth at bedtime 180 tablet 3  . methadone (DOLOPHINE) 10 MG tablet TAKE 2 TABLETS BY MOUTH 4 TIMES A DAY AS DIRECTED. 240 tablet 0  . metoprolol tartrate (LOPRESSOR) 25 MG tablet Take 1/2 tablet by mouth twice a day 90 tablet 3  . nitroGLYCERIN (NITROSTAT) 0.4 MG SL tablet Place 1 tablet (0.4 mg total) under the tongue every 5 (five) minutes as needed for chest pain. 100 tablet 1  . ondansetron (ZOFRAN ODT) 4 MG  disintegrating tablet Take 1 tablet (4 mg total) by mouth every 8 (eight) hours as needed for nausea or vomiting. 20 tablet 0  . pantoprazole (PROTONIX) 40 MG tablet Take 1 tablet by mouth twice a day 180 tablet 3  . Tiotropium Bromide-Olodaterol (STIOLTO RESPIMAT) 2.5-2.5 MCG/ACT AERS Inhale 2 puffs into the lungs daily. 4 g 5  . torsemide (DEMADEX) 20 MG tablet Take 2 tablets (40 mg total) by mouth daily. 180 tablet 3  . Treprostinil (TYVASO) 0.6 MG/ML SOLN Inhale 18 mcg into the lungs 4 (four) times daily.    . valACYclovir (VALTREX) 1000 MG tablet Take 1,000 mg by mouth daily as needed.  3  . budesonide (PULMICORT FLEXHALER) 180 MCG/ACT inhaler Inhale 2 puffs into the lungs 2 (two) times daily. 1 Inhaler  0   No facility-administered medications prior to visit.     Review of Systems  Review of Systems  Constitutional: Negative.   HENT: Negative.   Respiratory: Negative for cough, shortness of breath and wheezing.   Cardiovascular: Negative.    Physical Exam  BP 124/82 (BP Location: Right Arm, Cuff Size: Normal)   Pulse 75   Ht 5' (1.524 m)   Wt 152 lb 3.2 oz (69 kg)   SpO2 90%   BMI 29.72 kg/m  Physical Exam Constitutional:      Appearance: Normal appearance.  HENT:     Right Ear: Tympanic membrane normal.     Left Ear: Tympanic membrane normal.     Mouth/Throat:     Mouth: Mucous membranes are moist.     Pharynx: Oropharynx is clear.  Neck:     Musculoskeletal: Normal range of motion and neck supple.  Cardiovascular:     Rate and Rhythm: Normal rate and regular rhythm.     Heart sounds: Murmur present.     Comments: No leg edema  Pulmonary:     Effort: Pulmonary effort is normal.     Breath sounds: Normal breath sounds. No wheezing.  Musculoskeletal:     Comments: Uses rolling walker  Skin:    General: Skin is warm and dry.  Neurological:     General: No focal deficit present.     Mental Status: She is alert and oriented to person, place, and time. Mental  status is at baseline.  Psychiatric:        Mood and Affect: Mood normal.        Behavior: Behavior normal.        Thought Content: Thought content normal.        Judgment: Judgment normal.      Lab Results:  CBC    Component Value Date/Time   WBC 9.0 12/15/2018 1000   RBC 5.21 (H) 12/15/2018 1000   HGB 15.2 (H) 12/15/2018 1000   HCT 47.5 (H) 12/15/2018 1000   PLT 201 12/15/2018 1000   MCV 91.2 12/15/2018 1000   MCH 29.2 12/15/2018 1000   MCHC 32.0 12/15/2018 1000   RDW 13.0 12/15/2018 1000   LYMPHSABS 1.6 12/15/2018 1000   MONOABS 0.6 12/15/2018 1000   EOSABS 0.5 12/15/2018 1000   BASOSABS 0.1 12/15/2018 1000    BMET    Component Value Date/Time   NA 139 07/28/2018 1054   K 3.7 07/28/2018 1054   CL 94 (L) 07/28/2018 1054   CO2 32 07/28/2018 1054   GLUCOSE 181 (H) 07/28/2018 1054   BUN 27 (H) 07/28/2018 1054   CREATININE 1.76 (H) 07/28/2018 1054   CALCIUM 10.0 07/28/2018 1054   GFRNONAA 29 (L) 07/28/2018 1054   GFRAA 34 (L) 07/28/2018 1054    BNP    Component Value Date/Time   BNP 1,679.0 (H) 04/19/2018 2215    ProBNP No results found for: PROBNP  Imaging: No results found.   Assessment & Plan:   COPD (chronic obstructive pulmonary disease) ([place]) - Wheezing improved with addition of ICS  - Continue Stiolto 2 puffs daily and Pulmicort 2 puffs Bid - FU in 2-3 months or if breathing/wheezing worsens   Chronic respiratory failure with hypoxia ([facility]) - Stable; continues home oxygen 4-5 L - POC when out of the house   Pulmonary hypertension ([place]) - Continues 7 puffs QID Tyvaso  - If breathing worsen, consider adding Ambrisentan 28m daily    EMartyn Ehrich NP 12/16/2018

## 2018-12-15 NOTE — Progress Notes (Signed)
Patient here for follow up

## 2018-12-16 ENCOUNTER — Ambulatory Visit (INDEPENDENT_AMBULATORY_CARE_PROVIDER_SITE_OTHER): Payer: PPO | Admitting: Primary Care

## 2018-12-16 ENCOUNTER — Encounter: Payer: Self-pay | Admitting: Primary Care

## 2018-12-16 VITALS — BP 124/82 | HR 75 | Ht 60.0 in | Wt 152.2 lb

## 2018-12-16 DIAGNOSIS — J9611 Chronic respiratory failure with hypoxia: Secondary | ICD-10-CM

## 2018-12-16 DIAGNOSIS — I272 Pulmonary hypertension, unspecified: Secondary | ICD-10-CM

## 2018-12-16 DIAGNOSIS — J449 Chronic obstructive pulmonary disease, unspecified: Secondary | ICD-10-CM

## 2018-12-16 LAB — ERYTHROPOIETIN: Erythropoietin: 10.7 m[IU]/mL (ref 2.6–18.5)

## 2018-12-16 MED ORDER — BUDESONIDE 180 MCG/ACT IN AEPB: 2.0000 | INHALATION_SPRAY | Freq: Two times a day (BID) | RESPIRATORY_TRACT | 11 refills | Status: DC

## 2018-12-16 MED ORDER — BUDESONIDE 180 MCG/ACT IN AEPB: 2.0000 | INHALATION_SPRAY | Freq: Two times a day (BID) | RESPIRATORY_TRACT | 1 refills | Status: DC

## 2018-12-16 NOTE — Assessment & Plan Note (Addendum)
-  Continues 7 puffs QID Tyvaso  - If breathing worsen, consider adding Ambrisentan 26m daily

## 2018-12-16 NOTE — Progress Notes (Signed)
Reviewed, agree 

## 2018-12-16 NOTE — Assessment & Plan Note (Signed)
-  Stable; continues home oxygen 4-5 L - POC when out of the house

## 2018-12-16 NOTE — Assessment & Plan Note (Addendum)
-  Wheezing improved with addition of ICS  - Continue Stiolto 2 puffs daily and Pulmicort 2 puffs Bid - FU in 2-3 months or if breathing/wheezing worsens

## 2018-12-16 NOTE — Patient Instructions (Signed)
No changes today  Continue Pulmicort 2 puffs twice day   Follow up in 2-3 months with Dr. Lake Bells or sooner if you develop worsening shortness of breath/wheezing

## 2018-12-21 ENCOUNTER — Ambulatory Visit (INDEPENDENT_AMBULATORY_CARE_PROVIDER_SITE_OTHER): Payer: PPO | Admitting: Internal Medicine

## 2018-12-21 ENCOUNTER — Encounter: Payer: Self-pay | Admitting: Internal Medicine

## 2018-12-21 VITALS — BP 118/82 | HR 80 | Temp 98.0°F | Ht 60.0 in | Wt 150.0 lb

## 2018-12-21 DIAGNOSIS — E1151 Type 2 diabetes mellitus with diabetic peripheral angiopathy without gangrene: Secondary | ICD-10-CM

## 2018-12-21 DIAGNOSIS — E1149 Type 2 diabetes mellitus with other diabetic neurological complication: Secondary | ICD-10-CM

## 2018-12-21 DIAGNOSIS — F1120 Opioid dependence, uncomplicated: Secondary | ICD-10-CM

## 2018-12-21 DIAGNOSIS — IMO0002 Reserved for concepts with insufficient information to code with codable children: Secondary | ICD-10-CM

## 2018-12-21 DIAGNOSIS — E1165 Type 2 diabetes mellitus with hyperglycemia: Secondary | ICD-10-CM

## 2018-12-21 DIAGNOSIS — F39 Unspecified mood [affective] disorder: Secondary | ICD-10-CM

## 2018-12-21 DIAGNOSIS — M5440 Lumbago with sciatica, unspecified side: Secondary | ICD-10-CM

## 2018-12-21 DIAGNOSIS — G8929 Other chronic pain: Secondary | ICD-10-CM | POA: Diagnosis not present

## 2018-12-21 LAB — POCT GLYCOSYLATED HEMOGLOBIN (HGB A1C): HEMOGLOBIN A1C: 7.1 % — AB (ref 4.0–5.6)

## 2018-12-21 NOTE — Assessment & Plan Note (Signed)
Lab Results  Component Value Date   HGBA1C 7.1 (A) 12/21/2018   Good control Neuropathy is worse Will try adding gabapentin dose in the morning

## 2018-12-21 NOTE — Assessment & Plan Note (Signed)
Mood has been better with the improvement in her breathing overall

## 2018-12-21 NOTE — Progress Notes (Signed)
Subjective:    Patient ID: Lindsay Harmon, female    DOB: 03/03/1956, 63 y.o.   MRN: [id number]  HPI Here for follow up of chronic narcotic dependence Has not been taking the methadone 4 times a day Usually doesn't need refill for 35-38 days Satisfied with pain control  Limited with activity and house work by her breathing, but occasionally by her back pain Respiratory status has been stable  Checks sugars daily 103-145 with rare higher number No hypoglycemia Some increased foot pain--- electric "shock" like  Current Outpatient Medications on File Prior to Visit  Medication Sig Dispense Refill  . albuterol (PROVENTIL) (2.5 MG/3ML) 0.083% nebulizer solution Take 3 mLs (2.5 mg total) by nebulization every 6 (six) hours as needed for wheezing or shortness of breath. 360 mL 5  . albuterol (VENTOLIN HFA) 108 (90 Base) MCG/ACT inhaler USE 2 PUFFS EVERY SIX HOURS AS NEEDED FOR WHEEZING OR SHORTNESS OF BREATH 18 g 5  . ALPRAZolam (XANAX) 0.25 MG tablet Take 1 tablet (0.25 mg total) by mouth daily as needed for anxiety. 30 tablet 0  . aspirin 325 MG tablet Take 325 mg by mouth at bedtime.     . budesonide (PULMICORT FLEXHALER) 180 MCG/ACT inhaler Inhale 2 puffs into the lungs 2 (two) times daily. 1 Inhaler 1  . calcitRIOL (ROCALTROL) 0.5 MCG capsule Take 0.5 mcg by mouth daily.     . cholecalciferol (VITAMIN D) 1000 units tablet Take 1,000 Units by mouth at bedtime.     . ferrous sulfate 325 (65 FE) MG tablet Take 325 mg by mouth daily with breakfast.    . gabapentin (NEURONTIN) 300 MG capsule Take 600 mg by mouth at bedtime.    Marland Kitchen glucose blood (ONE TOUCH ULTRA TEST) test strip USE 1 STRIP TO TEST BLOOD SUGAR ONCE DAILY Dx Code E11.51 100 each 3  . hydrocortisone 2.5 % cream Apply topically 3 (three) times daily as needed. 28 g 3  . Insulin Glargine (LANTUS SOLOSTAR) 100 UNIT/ML Solostar Pen Inject 20 Units into the skin at bedtime. 9 pen 5  . Insulin Pen Needle (SURE COMFORT PEN NEEDLES)  31G X 5 MM MISC USE 1 PEN NEEDLE TO INJECT INSULIN 90 each 3  . loratadine (CLARITIN) 10 MG tablet Take 10 mg by mouth 2 (two) times daily.    Marland Kitchen lovastatin (MEVACOR) 40 MG tablet Take 2 tablets by mouth at bedtime 180 tablet 3  . methadone (DOLOPHINE) 10 MG tablet TAKE 2 TABLETS BY MOUTH 4 TIMES A DAY AS DIRECTED. 240 tablet 0  . metoprolol tartrate (LOPRESSOR) 25 MG tablet Take 1/2 tablet by mouth twice a day 90 tablet 3  . nitroGLYCERIN (NITROSTAT) 0.4 MG SL tablet Place 1 tablet (0.4 mg total) under the tongue every 5 (five) minutes as needed for chest pain. 100 tablet 1  . ondansetron (ZOFRAN ODT) 4 MG disintegrating tablet Take 1 tablet (4 mg total) by mouth every 8 (eight) hours as needed for nausea or vomiting. 20 tablet 0  . pantoprazole (PROTONIX) 40 MG tablet Take 1 tablet by mouth twice a day 180 tablet 3  . Tiotropium Bromide-Olodaterol (STIOLTO RESPIMAT) 2.5-2.5 MCG/ACT AERS Inhale 2 puffs into the lungs daily. 4 g 5  . torsemide (DEMADEX) 20 MG tablet Take 2 tablets (40 mg total) by mouth daily. 180 tablet 3  . Treprostinil (TYVASO) 0.6 MG/ML SOLN Inhale 18 mcg into the lungs 4 (four) times daily.    . valACYclovir (VALTREX) 1000 MG tablet Take 1,000  mg by mouth daily as needed.  3   No current facility-administered medications on file prior to visit.     Allergies  Allergen Reactions  . Sertraline Hcl Other (See Comments)    Cloudy thoughts, malaise    Past Medical History:  Diagnosis Date  . Allergy   . Aortic atherosclerosis ([place])   . Arthritis   . CKD (chronic kidney disease), stage IV ([place])   . COPD (chronic obstructive pulmonary disease) ([place])    a. 11/2014 PFT's: FEV1 0.87 (38%), FVC 1.41 (48%), FEF 25-75 0.41 (19%). Unable to perform DLCO; b. 12/2017 Simple spirometry ration 67%. FEV1 1.21 L+53% predicted. FVC 1.79L 61% predicted.  . Coronary artery calcification seen on CT scan    a. 03/2017 CT Chest.  . Depression   . Diabetic neuropathy ([place])   . Esophageal  stricture   . Familial hematuria   . GERD (gastroesophageal reflux disease)   . Hyperlipidemia   . Hypertension   . IBS (irritable bowel syndrome)   . Nephrolithiasis   . NIDDM (non-insulin dependent diabetes mellitus)   . Obesity, unspecified   . Pulmonary hypertension ([place])    a. WHO Group 3; b. 10/2016 Echo: EF 55-60%, no rwma, Gr1 DD, mild to mod AS, mean grad 42mHg, mildly dil RV w/ mildly reduced RV fxn, mildly dil RA. PASP 1144mg; c. 12/2017 RHC: RA 11, RV 92/12, PA 92/33(57), PCWP 15, Fick CO/CI 5.9/3.3. PVR 7.2 WU. Ao 93%, PA 62/64%.  . Marland KitchenVD (peripheral vascular disease) ([place] [place]    Past Surgical History:  Procedure Laterality Date  . ABDOMINAL HYSTERECTOMY    . CARPAL TUNNEL RELEASE    . CESAREAN SECTION    . ERCP N/A 03/17/2017   Procedure: ENDOSCOPIC RETROGRADE CHOLANGIOPANCREATOGRAPHY (ERCP);  Surgeon: WoLucilla LameMD;  [facility];  Service: Endoscopy;  Laterality: N/A;  . EUS N/A 03/13/2017   Procedure: FULL UPPER ENDOSCOPIC ULTRASOUND (EUS) RADIAL;  Surgeon: Burbridge, ReMurray HodgkinsMD;  Location: [facility] ENDOSCOPY;  Service: Endoscopy;  Laterality: N/A;  . RIGHT HEART CATH N/A 01/22/2018   Procedure: RIGHT HEART CATH;  Surgeon: BeJolaine ArtistMD;  [facility];  Service: Cardiovascular;  Laterality: N/A;  . RIGHT HEART CATHETERIZATION N/A 12/05/2014   Procedure: RIGHT HEART CATH;  Surgeon: HeSinclair GroomsMD;  [facility];  Service: Cardiovascular;  Laterality: N/A;    Family History  Problem Relation Age of Onset  . Diabetes Mother   . Cancer Mother        ovarian,melanoma  . Allergies Father   . Cancer Father        lung  . Cancer Maternal Grandmother        uterine  . Emphysema Paternal Grandfather   . Allergies Sister   . Breast cancer Neg Hx     Social History   Socioeconomic History  . Marital status: Legally Separated    Spouse name: Not on file  . Number of children: 1  . Years of education: Not on file  .  Highest education level: Not on file  Occupational History  . 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 Financial resource strain: Not on file  . Food insecurity:    Worry: Not on file    Inability: Not on file  . Transportation needs:    Medical: Not on file    Non-medical: Not on file  Tobacco Use  .  Smoking status: Former Smoker    Packs/day: 1.50    Years: 33.00    Pack years: 49.50    Types: Cigarettes    Last attempt to quit: 07/28/2005    Years since quitting: 13.4  . Smokeless tobacco: Never Used  Substance and Sexual Activity  . Alcohol use: No    Alcohol/week: 0.0 standard drinks    Comment: heavy in the past  . Drug use: No  . Sexual activity: Never  Lifestyle  . Physical activity:    Days per week: Not on file    Minutes per session: Not on file  . Stress: Not on file  Relationships  . Social connections:    Talks on phone: Not on file    Gets together: Not on file    Attends religious service: Not on file    Active member of club or organization: Not on file    Attends meetings of clubs or organizations: Not on file    Relationship status: Not on file  . Intimate partner violence:    Fear of current or ex partner: Not on file    Emotionally abused: Not on file    Physically abused: Not on file    Forced sexual activity: Not on file  Other Topics Concern  . Not on file  Social History Narrative   No living will   Son [name] (then mom) should make decisions for her if she is unable.    Would accept resuscitation attempts but no prolonged ventilation   Not sure about tube feeds but probably wouldn't want them if cognitively unaware   Review of Systems  Sleeping a bit better Appetite is good Mood has been good lately    Objective:   Physical Exam  Constitutional: She appears well-developed. No distress.  Cardiovascular: Normal rate and regular rhythm. Exam reveals no gallop.  Soft systolic murmur along  left sternal border  Respiratory: Effort normal and breath sounds normal. No respiratory distress. She has no wheezes. She has no rales.  Musculoskeletal:        General: No edema.  Psychiatric: She has a normal mood and affect. Her behavior is normal.           Assessment & Plan:

## 2018-12-21 NOTE — Assessment & Plan Note (Signed)
Reviewed CSRS---nothing of concern

## 2018-12-21 NOTE — Assessment & Plan Note (Signed)
Ongoing pain but stable Still limits her to some degree

## 2018-12-31 ENCOUNTER — Encounter: Payer: Self-pay | Admitting: Internal Medicine

## 2019-01-01 DIAGNOSIS — J439 Emphysema, unspecified: Secondary | ICD-10-CM | POA: Diagnosis not present

## 2019-01-07 DIAGNOSIS — R600 Localized edema: Secondary | ICD-10-CM | POA: Diagnosis not present

## 2019-01-07 DIAGNOSIS — N2581 Secondary hyperparathyroidism of renal origin: Secondary | ICD-10-CM | POA: Diagnosis not present

## 2019-01-07 DIAGNOSIS — R601 Generalized edema: Secondary | ICD-10-CM | POA: Diagnosis not present

## 2019-01-07 DIAGNOSIS — E1122 Type 2 diabetes mellitus with diabetic chronic kidney disease: Secondary | ICD-10-CM | POA: Diagnosis not present

## 2019-01-07 DIAGNOSIS — R809 Proteinuria, unspecified: Secondary | ICD-10-CM | POA: Diagnosis not present

## 2019-01-07 DIAGNOSIS — N183 Chronic kidney disease, stage 3 (moderate): Secondary | ICD-10-CM | POA: Diagnosis not present

## 2019-01-11 ENCOUNTER — Telehealth: Payer: Self-pay | Admitting: Pulmonary Disease

## 2019-01-11 MED ORDER — NYSTATIN 100000 UNIT/ML MT SUSP: 5.0000 mL | Freq: Four times a day (QID) | OROMUCOSAL | 0 refills | Status: DC

## 2019-01-11 NOTE — Telephone Encounter (Signed)
Primary Pulmonologist: BQ Last office visit and with whom: 12/16/18 with Beth What do we see them for (pulmonary problems): COPD and pulmonary hypertension  Last OV assessment/plan: No changes today  Continue Pulmicort 2 puffs twice day   Follow up in 2-3 months with Dr. Lake Bells or sooner if you develop worsening shortness of breath/wheezing   Was appointment offered to patient (explain)?  No since we are not offering patient's sick appts.   Reason for call: Spoke with patient. She stated that she noticed yesterday that the back of her throat has become sensitive. She was trying to eat sausage gravy yesterday and the back of her throat started to burn. She looked in her mouth and saw white patches towards the back of her throat. She believes it is thrush. She stopped using her Pulmicort in fear that it may have come from this.   Denies any new medications since her last visit here. Also denied any trouble swallowing.   Pharmacy is [facility] in [place].   Beth, please advise since you were the last one to see her. Thanks!

## 2019-01-11 NOTE — Telephone Encounter (Signed)
Spoke with pt.  She questioned if she should swish and spit or swallow.  Instructed her to swish and swallow.  Nothing further is needed.

## 2019-01-11 NOTE — Telephone Encounter (Signed)
RX nystatin sent

## 2019-01-11 NOTE — Telephone Encounter (Signed)
Spoke with the pt and notified rx for nytstatin was sent  She verbalized understanding

## 2019-01-13 ENCOUNTER — Other Ambulatory Visit: Payer: Self-pay | Admitting: *Deleted

## 2019-01-13 NOTE — Patient Outreach (Signed)
[facility]) Care Management  01/13/2019  Lindsay Harmon 1956-04-15 [id number]  RN Health Coach attempted follow up outreach call to patient.  Patient was unavailable. HIPPA compliance voicemail message left with return callback number.  Plan: RN will call patient again within 30 days.  [place] Care Management [phone]

## 2019-01-26 ENCOUNTER — Other Ambulatory Visit: Payer: Self-pay | Admitting: Internal Medicine

## 2019-01-26 NOTE — Telephone Encounter (Signed)
Name of Medication: Methadone Name of Pharmacy: [facility] or Written Date and Quantity: 12-11-18 #240 Last Office Visit and Type: 3 month f/u 12-21-18 Next Office Visit and Type: 3 month f/u 03-24-19 Last Controlled Substance Agreement Date: 07-14-18 Last UDS: 07-14-18

## 2019-02-01 DIAGNOSIS — R4182 Altered mental status, unspecified: Secondary | ICD-10-CM | POA: Diagnosis not present

## 2019-02-01 DIAGNOSIS — R410 Disorientation, unspecified: Secondary | ICD-10-CM | POA: Diagnosis not present

## 2019-02-01 DIAGNOSIS — J439 Emphysema, unspecified: Secondary | ICD-10-CM | POA: Diagnosis not present

## 2019-02-11 ENCOUNTER — Other Ambulatory Visit: Payer: Self-pay | Admitting: *Deleted

## 2019-02-12 NOTE — Patient Outreach (Signed)
[facility]) Care Management  02/12/2019  Lindsay Harmon Jun 18, 1956 [id number]   RN Health Coach attempted follow up outreach call to patient.  Patient was unavailable. HIPPA compliance voicemail message left with return callback number.  Plan: RN will call patient again within 30 days.  [place] Care Management [phone]

## 2019-02-23 ENCOUNTER — Other Ambulatory Visit: Payer: Self-pay | Admitting: Internal Medicine

## 2019-02-24 NOTE — Telephone Encounter (Signed)
Name of Medication: Methadone Name of Pharmacy: [facility] or Written Date and Quantity: 01-26-19 #240 Last Office Visit and Type: 3 Month F/U 12-21-18 Next Office Visit and Type: 3 Month F/U 03-24-19 Last Controlled Substance Agreement Date: 07-14-18 Last UDS: 07-14-18

## 2019-03-01 ENCOUNTER — Ambulatory Visit: Payer: PPO | Admitting: Internal Medicine

## 2019-03-01 ENCOUNTER — Encounter: Payer: Self-pay | Admitting: Internal Medicine

## 2019-03-01 ENCOUNTER — Other Ambulatory Visit: Payer: Self-pay

## 2019-03-02 NOTE — Progress Notes (Signed)
Visit rescheduled

## 2019-03-02 NOTE — Assessment & Plan Note (Signed)
No visit

## 2019-03-03 ENCOUNTER — Ambulatory Visit (INDEPENDENT_AMBULATORY_CARE_PROVIDER_SITE_OTHER): Payer: PPO | Admitting: Internal Medicine

## 2019-03-03 ENCOUNTER — Encounter: Payer: Self-pay | Admitting: Internal Medicine

## 2019-03-03 VITALS — Ht 60.0 in | Wt 152.0 lb

## 2019-03-03 DIAGNOSIS — M79672 Pain in left foot: Secondary | ICD-10-CM

## 2019-03-03 DIAGNOSIS — J439 Emphysema, unspecified: Secondary | ICD-10-CM | POA: Diagnosis not present

## 2019-03-03 DIAGNOSIS — M79671 Pain in right foot: Secondary | ICD-10-CM

## 2019-03-03 NOTE — Assessment & Plan Note (Signed)
With bluish discoloration This could be venous in origin but I am concerned about arterial insufficiency due to the pain No CHF exacerbation fortunately Will set up ABI ASAP

## 2019-03-03 NOTE — Progress Notes (Signed)
Subjective:    Patient ID: Lindsay Harmon, female    DOB: [birth year], 63 y.o.   MRN: [id number]  HPI Virtual visit due to concern with blue discoloration of feet Identification done Reviewed billing and she gave consent She is in her home with sister Karna Christmas (who is helping with the technical aspects) and I am in my office  Has had blue discoloration in feet-- starting about 10 days ago Most noticeable by toes Does note some aching in the right foot---if it is down for a while Left foot less involved Had noted swelling at first---better in the past few days Will get pain if she is up for an extended time  No chest pain No palpitations  Current Outpatient Medications on File Prior to Visit  Medication Sig Dispense Refill  . albuterol (PROVENTIL) (2.5 MG/3ML) 0.083% nebulizer solution Take 3 mLs (2.5 mg total) by nebulization every 6 (six) hours as needed for wheezing or shortness of breath. 360 mL 5  . albuterol (VENTOLIN HFA) 108 (90 Base) MCG/ACT inhaler USE 2 PUFFS EVERY SIX HOURS AS NEEDED FOR WHEEZING OR SHORTNESS OF BREATH 18 g 5  . ALPRAZolam (XANAX) 0.25 MG tablet Take 1 tablet (0.25 mg total) by mouth daily as needed for anxiety. 30 tablet 0  . aspirin 325 MG tablet Take 325 mg by mouth at bedtime.     . budesonide (PULMICORT FLEXHALER) 180 MCG/ACT inhaler Inhale 2 puffs into the lungs 2 (two) times daily. 1 Inhaler 1  . calcitRIOL (ROCALTROL) 0.5 MCG capsule Take 0.5 mcg by mouth daily.     . cholecalciferol (VITAMIN D) 1000 units tablet Take 1,000 Units by mouth at bedtime.     . ferrous sulfate 325 (65 FE) MG tablet Take 325 mg by mouth daily with breakfast.    . gabapentin (NEURONTIN) 300 MG capsule TAKE 1 CAPSULE BY MOUTH AT BEDTIME. INCREASE TO 2 CAPSULES AT BEDTIMEIF NEEDED 60 capsule 11  . glucose blood (ONE TOUCH ULTRA TEST) test strip USE 1 STRIP TO TEST BLOOD SUGAR ONCE DAILY Dx Code E11.51 100 each 3  . hydrocortisone 2.5 % cream Apply topically 3 (three) times  daily as needed. 28 g 3  . Insulin Glargine (LANTUS SOLOSTAR) 100 UNIT/ML Solostar Pen Inject 20 Units into the skin at bedtime. 9 pen 5  . Insulin Pen Needle (SURE COMFORT PEN NEEDLES) 31G X 5 MM MISC USE 1 PEN NEEDLE TO INJECT INSULIN 90 each 3  . loratadine (CLARITIN) 10 MG tablet Take 10 mg by mouth 2 (two) times daily.    Marland Kitchen lovastatin (MEVACOR) 40 MG tablet Take 2 tablets by mouth at bedtime 180 tablet 3  . methadone (DOLOPHINE) 10 MG tablet TAKE 2 TABLETS BY MOUTH 4 TIMES A DAY AS DIRECTED 240 tablet 0  . metoprolol tartrate (LOPRESSOR) 25 MG tablet Take 1/2 tablet by mouth twice a day 90 tablet 3  . nitroGLYCERIN (NITROSTAT) 0.4 MG SL tablet Place 1 tablet (0.4 mg total) under the tongue every 5 (five) minutes as needed for chest pain. 100 tablet 1  . nystatin (MYCOSTATIN) 100000 UNIT/ML suspension Take 5 mLs (500,000 Units total) by mouth 4 (four) times daily. 240 mL 0  . ondansetron (ZOFRAN ODT) 4 MG disintegrating tablet Take 1 tablet (4 mg total) by mouth every 8 (eight) hours as needed for nausea or vomiting. 20 tablet 0  . pantoprazole (PROTONIX) 40 MG tablet Take 1 tablet by mouth twice a day 180 tablet 3  . Tiotropium Bromide-Olodaterol (  STIOLTO RESPIMAT) 2.5-2.5 MCG/ACT AERS Inhale 2 puffs into the lungs daily. 4 g 5  . torsemide (DEMADEX) 20 MG tablet Take 2 tablets (40 mg total) by mouth daily. 180 tablet 3  . Treprostinil (TYVASO) 0.6 MG/ML SOLN Inhale 18 mcg into the lungs 4 (four) times daily.    . valACYclovir (VALTREX) 1000 MG tablet Take 1,000 mg by mouth daily as needed.  3   No current facility-administered medications on file prior to visit.     Allergies  Allergen Reactions  . Sertraline Hcl Other (See Comments)    Cloudy thoughts, malaise    Past Medical History:  Diagnosis Date  . Allergy   . Aortic atherosclerosis ([place])   . Arthritis   . CKD (chronic kidney disease), stage IV ([place])   . COPD (chronic obstructive pulmonary disease) ([place])    a. 11/2014  PFT's: FEV1 0.87 (38%), FVC 1.41 (48%), FEF 25-75 0.41 (19%). Unable to perform DLCO; b. 12/2017 Simple spirometry ration 67%. FEV1 1.21 L+53% predicted. FVC 1.79L 61% predicted.  . Coronary artery calcification seen on CT scan    a. 03/2017 CT Chest.  . Depression   . Diabetic neuropathy ([place])   . Esophageal stricture   . Familial hematuria   . GERD (gastroesophageal reflux disease)   . Hyperlipidemia   . Hypertension   . IBS (irritable bowel syndrome)   . Nephrolithiasis   . NIDDM (non-insulin dependent diabetes mellitus)   . Obesity, unspecified   . Pulmonary hypertension ([place])    a. WHO Group 3; b. 10/2016 Echo: EF 55-60%, no rwma, Gr1 DD, mild to mod AS, mean grad 46mHg, mildly dil RV w/ mildly reduced RV fxn, mildly dil RA. PASP 1164mg; c. 12/2017 RHC: RA 11, RV 92/12, PA 92/33(57), PCWP 15, Fick CO/CI 5.9/3.3. PVR 7.2 WU. Ao 93%, PA 62/64%.  . Marland KitchenVD (peripheral vascular disease) ([place]    Past Surgical History:  Procedure Laterality Date  . ABDOMINAL HYSTERECTOMY    . CARPAL TUNNEL RELEASE    . CESAREAN SECTION    . ERCP N/A 03/17/2017   Procedure: ENDOSCOPIC RETROGRADE CHOLANGIOPANCREATOGRAPHY (ERCP);  Surgeon: WoLucilla LameMD;  [facility];  Service: Endoscopy;  Laterality: N/A;  . EUS N/A 03/13/2017   Procedure: FULL UPPER ENDOSCOPIC ULTRASOUND (EUS) RADIAL;  Surgeon: Burbridge, ReMurray HodgkinsMD;  Location: [facility] END[facility]COPY;  Service: Endoscopy;  Laterality: N/A;  . RIGHT HEART CATH N/A 01/22/2018   Procedure: RIGHT HEART CATH;  Surgeon: BeJolaine ArtistMD;  [facility];  Service: Cardiovascular;  Laterality: N/A;  . RIGHT HEART CATHETERIZATION N/A 12/05/2014   Procedure: RIGHT HEART CATH;  Surgeon: HeSinclair GroomsMD;  [facility];  Service: Cardiovascular;  Laterality: N/A;    Family History  Problem Relation Age of Onset  . Diabetes Mother   . Cancer Mother        ovarian,melanoma  . Allergies Father   . Cancer Father         lung  . Cancer Maternal Grandmother        uterine  . Emphysema Paternal Grandfather   . Allergies Sister   . Breast cancer Neg Hx     Social History   Socioeconomic History  . Marital status: Legally Separated    Spouse name: Not on file  . Number of children: 1  . Years of education: Not on file  . Highest education level: Not on file  Occupational History  . 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 Financial resource strain: Not on file  . Food insecurity:    Worry: Not on file    Inability: Not on file  . Transportation needs:    Medical: Not on file    Non-medical: Not on file  Tobacco Use  . Smoking status: Former Smoker    Packs/day: 1.50    Years: 33.00    Pack years: 49.50    Types: Cigarettes    Last attempt to quit: 07/28/2005    Years since quitting: 13.6  . Smokeless tobacco: Never Used  Substance and Sexual Activity  . Alcohol use: No    Alcohol/week: 0.0 standard drinks    Comment: heavy in the past  . Drug use: No  . Sexual activity: Never  Lifestyle  . Physical activity:    Days per week: Not on file    Minutes per session: Not on file  . Stress: Not on file  Relationships  . Social connections:    Talks on phone: Not on file    Gets together: Not on file    Attends religious service: Not on file    Active member of club or organization: Not on file    Attends meetings of clubs or organizations: Not on file    Relationship status: Not on file  . Intimate partner violence:    Fear of current or ex partner: Not on file    Emotionally abused: Not on file    Physically abused: Not on file    Forced sexual activity: Not on file  Other Topics Concern  . Not on file  Social History Narrative   No living will   Son [name] (then mom) should make decisions for her if she is unable.    Would accept resuscitation attempts but no prolonged ventilation   Not sure about tube feeds but probably wouldn't  want them if cognitively unaware   Review of Systems No recent fever or illness No change in cough Some increased SOB---attributed to pollen Checks weight daily---is stable    Objective:   Physical Exam  Constitutional: She appears well-developed. No distress.  Respiratory: Effort normal. No respiratory distress.  Musculoskeletal:        General: No edema.  Skin:  Bluish discoloration distally on right>left Reddish in places as well No skin breakdown           Assessment & Plan:

## 2019-03-04 ENCOUNTER — Ambulatory Visit (HOSPITAL_COMMUNITY)
Admission: RE | Admit: 2019-03-04 | Discharge: 2019-03-04 | Disposition: A | Discharge status: Home / Self Care | Payer: PPO | Source: Ambulatory Visit | Attending: Internal Medicine | Admitting: Internal Medicine

## 2019-03-04 ENCOUNTER — Other Ambulatory Visit: Payer: Self-pay

## 2019-03-04 ENCOUNTER — Other Ambulatory Visit: Payer: Self-pay | Admitting: Internal Medicine

## 2019-03-04 DIAGNOSIS — I739 Peripheral vascular disease, unspecified: Secondary | ICD-10-CM

## 2019-03-04 DIAGNOSIS — M79671 Pain in right foot: Secondary | ICD-10-CM | POA: Diagnosis not present

## 2019-03-04 DIAGNOSIS — L819 Disorder of pigmentation, unspecified: Secondary | ICD-10-CM | POA: Diagnosis not present

## 2019-03-04 DIAGNOSIS — M79672 Pain in left foot: Secondary | ICD-10-CM

## 2019-03-05 ENCOUNTER — Telehealth: Payer: Self-pay | Admitting: Pulmonary Disease

## 2019-03-05 ENCOUNTER — Telehealth: Payer: Self-pay

## 2019-03-05 NOTE — Telephone Encounter (Signed)
Pt said that at the 12/21/18 visit Dr Silvio Pate advised pt could take gabapentin 300 mg taking one capsule in AM and 2 caps at hs. Pt said she tried to get refill and was advised too soon to get med due to instructions being 2 caps at hs. Pt request new rx gabapentin 300 mg taking one cap in morning and 2 caps at hs to [place].Please advise.

## 2019-03-05 NOTE — Telephone Encounter (Signed)
lmtcb x1 pt 

## 2019-03-08 MED ORDER — GABAPENTIN 300 MG PO CAPS: ORAL_CAPSULE | ORAL | 11 refills | Status: DC

## 2019-03-08 NOTE — Telephone Encounter (Signed)
Directions corrected and new rx sent to pharmacy

## 2019-03-08 NOTE — Telephone Encounter (Signed)
HFA

## 2019-03-08 NOTE — Telephone Encounter (Signed)
We have asmanex twisthalers and HFA.  What would you like?

## 2019-03-08 NOTE — Telephone Encounter (Signed)
Pulmicort flexihaler 180 MCG is not on patient's preferred list.  Preferred are: Flovent HFA or Diskus/Asmanex or Arnuity  Which would you like to switch to?

## 2019-03-08 NOTE — Telephone Encounter (Signed)
Do we have samples of asmanex or flovent 110

## 2019-03-09 MED ORDER — MOMETASONE FUROATE 220 MCG/INH IN AEPB: 2.0000 | INHALATION_SPRAY | Freq: Two times a day (BID) | RESPIRATORY_TRACT | 5 refills | Status: DC

## 2019-03-09 NOTE — Telephone Encounter (Signed)
asmanex twist haler sent to pharmacy.

## 2019-03-09 NOTE — Telephone Encounter (Signed)
Spoke to pt and informed her of the inhaler sent to preferred pharmacy.  Nothing further is needed.

## 2019-03-10 ENCOUNTER — Other Ambulatory Visit: Payer: Self-pay | Admitting: *Deleted

## 2019-03-10 NOTE — Patient Outreach (Deleted)
[facility] [facility]) Care Management  03/10/2019  JESSAH DANSER 1956/01/26 [id number]   RN Health Coach case closure. Patient is in external [facility] program.  Plan: RN closure letter sent to PCP and patient   [place] Management [phone]

## 2019-03-10 NOTE — Patient Outreach (Signed)
[facility] Care Management  03/10/2019  Lindsay Harmon 12/25/1955 [id number]   RN Health Coach case closure. Patient is in external [facility] program.  Plan: RN closure letter sent to PCP and patient   Christina Waldrop BSN RN [facility] Care Management [phone]  

## 2019-03-12 ENCOUNTER — Ambulatory Visit: Payer: Self-pay | Admitting: *Deleted

## 2019-03-16 NOTE — Progress Notes (Signed)
Virtual Visit via Video Note  I connected with Lindsay Harmon on 03/16/19 at 10:00 AM EDT by a video enabled telemedicine application and verified that I am speaking with the correct person using two identifiers.  I discussed the limitations of evaluation and management by telemedicine and the availability of in person appointments. The patient expressed understanding and agreed to proceed.  Location: Patient: Home Provider: Office   Synopsis: Referred by cardiology in 2016 for evaluation of severe COPD and possible obstructive sleep apnea in the setting of pulmonary hypertension. She also has congestive heart failure. A sleep study in 2016 showed no evidence of obstructive sleep apnea but she does have significant nocturnal hypoxemia. Spirometry testing in 2016 confirmed a diagnosis of COPD with severe airflow obstruction FEV1 41% pred.  She smoked cigarettes for many years and she quit smoking in 2013 after smoking 2 ppd for 40 years. In 2019 she took sildenafil and her oxygenation worsened while hospitalized.  We ultimately started Tyvaso.  History of Present Illness: 63 year old female, former smoker quit in 2006 (50 pack year hx). PMH significant for COPD, pulmonary hypertension. Patient of Dr. Lake Bells. Maintained on Stiolto and Tyvaso. She is unable to go any higher than 7 puffs QID on Tyvaso because this causes increased airway symptoms such as wheezing. Per Dr. Anastasia Pall note he would like to maximize COPD treatment to see if this makes any difference in dyspnea symptoms. Started on Pulmicort 2 puffs daily, continues Stiolto twice daily and prn albuterol. If this does not improve will need to check echocardiogram and consider ramping up her pulmonary hypertension treatment by starting ambrisentan 14m daily.    Previous [place] Encounter: 12/16/2018 Patient presents today for 2 week follow-up visit. Feels her breathing and wheezing have somewhat improved. States that she actually feels a lot  better. Took pulmicort today. Needs RX for prescription. Uses rolling walker, does most of her own house work and shopping. Reports that she sometimes she has to stop when vacuuming but that it is not always due to her breathing, can be because of her back pain. Denies cough.   03/17/2019 Patient called today for 3 month follow-up visit. Breathing has been ok. Continues Stiolto and Pulmicort until she runs out of medication.  Pulmicort was not covered by insurance, changed to [employer] She has not started new ICS inhaler yet. Used Albuterol rescue inhaler 2-3 times in the last month. Has only needed to use nebulizer once. Weight remains stable, reports 153lbs every day. Hasnt been out much, has family to help. Denies cough or wheeze.   Observations/Objective:  - O2 91% 4L   Assessment and Plan:  PAH - Weight stable  - Continue 7 puffs Tyvaso QID - If breathing worsens consider adding Ambrisentan 528mdaily   COPD - No recent exacerbations. Denies wheezing or cough.  - Continue Stiolto 2 puffs daily - Continue Asmanex 2 puffs twice daily (pulmicort not on preferred medication list) - Continue Albuterol hfa as needed for shortness of breath/wheezing  Chronic respiratory failure with hypoxia - Continuous home oxygen 4-5 L to keep O2 >88-90% - Use POC when out of the house   Follow Up Instructions:  2-3 months with Dr. McLake Bellsn office or sooner if symptoms worsen    I discussed the assessment and treatment plan with the patient. The patient was provided an opportunity to ask questions and all were answered. The patient agreed with the plan and demonstrated an understanding of the instructions.  The patient was advised to call back or seek an in-person evaluation if the symptoms worsen or if the condition fails to improve as anticipated.  I provided 22 minutes of non-face-to-face time during this encounter.   Martyn Ehrich, NP

## 2019-03-17 ENCOUNTER — Encounter: Payer: Self-pay | Admitting: Primary Care

## 2019-03-17 ENCOUNTER — Ambulatory Visit (HOSPITAL_COMMUNITY)
Admission: RE | Admit: 2019-03-17 | Discharge: 2019-03-17 | Disposition: A | Discharge status: Home / Self Care | Payer: PPO | Source: Ambulatory Visit | Attending: Cardiology | Admitting: Cardiology

## 2019-03-17 ENCOUNTER — Ambulatory Visit: Payer: PPO | Admitting: Pulmonary Disease

## 2019-03-17 ENCOUNTER — Ambulatory Visit (INDEPENDENT_AMBULATORY_CARE_PROVIDER_SITE_OTHER): Payer: PPO | Admitting: Primary Care

## 2019-03-17 ENCOUNTER — Other Ambulatory Visit: Payer: Self-pay

## 2019-03-17 ENCOUNTER — Ambulatory Visit: Payer: Self-pay | Admitting: *Deleted

## 2019-03-17 ENCOUNTER — Other Ambulatory Visit (HOSPITAL_COMMUNITY): Payer: Self-pay | Admitting: Internal Medicine

## 2019-03-17 DIAGNOSIS — I2721 Secondary pulmonary arterial hypertension: Secondary | ICD-10-CM

## 2019-03-17 DIAGNOSIS — I739 Peripheral vascular disease, unspecified: Secondary | ICD-10-CM

## 2019-03-17 DIAGNOSIS — J449 Chronic obstructive pulmonary disease, unspecified: Secondary | ICD-10-CM | POA: Diagnosis not present

## 2019-03-17 NOTE — Patient Instructions (Signed)
PAH - Continue to monitor weight - Continue 7 puffs Tyvaso QID - If breathing worsens or you are requiring rescue inhaler more frequently notify office   COPD - Continue Stiolto 2 puffs daily - Continue Asmanex 2 puffs twice daily  - Continue Albuterol hfa/neb every 6 hours as needed for shortness of breath/wheezing  Chronic respiratory failure with hypoxia - Continue home oxygen 4-5 L to keep O2 >88-90% - Use portable oxygen when out of the house  Follow-up - 2-3 months with Dr. Lake Bells or sooner if needed

## 2019-03-18 ENCOUNTER — Telehealth: Payer: Self-pay | Admitting: Internal Medicine

## 2019-03-18 ENCOUNTER — Telehealth: Payer: Self-pay | Admitting: *Deleted

## 2019-03-18 MED ORDER — GABAPENTIN 300 MG PO CAPS: ORAL_CAPSULE | ORAL | 11 refills | Status: DC

## 2019-03-18 NOTE — Telephone Encounter (Signed)
Patient has a 3 month follow up with Dr.Letvak on 03/24/19. Patient was able to switch appointment to a phone call.  She'll check to see if her sister can come over, who has a smart phone, and then she could switch appointment to a virtual.  Does patient need to come in before appointment to have her A1c checked?  Patient said [facility] is coming to her house on Friday and they could check the A1c, but I let her know Dr.Letvak is on vacation, so I can't get the order. Please let me know if an order is put in, so I can call patient to schedule lab appointment.

## 2019-03-18 NOTE — Telephone Encounter (Signed)
Spoke with the patient. She stated that she was unable to come tomorrow for an appointment.   An appointment has been made for 03/23/2019 at 11 am with Dr. Fletcher Anon on the [facility] schedule but she will be seen in [place]. She has been made aware to check into the medical mall that morning and they will lead her to the office. 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      COVID-19 Pre-Screening Questions:  . In the past 7 to 10 days have you had a cough,  shortness of breath, headache, congestion, fever (100 or greater) body aches, chills, sore throat, or sudden loss of taste or sense of smell? No . Have you been around anyone with known Covid 19. No . Have you been around anyone who is awaiting Covid 19 test results in the past 7 to 10 days? No . Have you been around anyone who has been exposed to Covid 19, or has mentioned symptoms of Covid 19 within the past 7 to 10 days? No  If you have any concerns/questions about symptoms patients report during screening (either on the phone or at threshold). Contact the provider seeing the patient or DOD for further guidance.  If neither are available contact a member of the leadership team.

## 2019-03-18 NOTE — Telephone Encounter (Signed)
-----  Message from Wellington Hampshire, MD sent at 03/18/2019 12:46 PM EDT ----- Faythe Ghee will take care of this.  Scheduling, Please add her to my schedule tomorrow if she is able to come.  She needs an office visit. Thanks.   ----- Message ----- From: Venia Carbon, MD Sent: 03/18/2019   9:22 AM EDT To: Wellington Hampshire, MD  Muhammed, I am pretty sure I put in a consult for her to see you after the preliminary study. I don't see an appt for her Can you check on this? I can put in another consult order if you need  Rich

## 2019-03-18 NOTE — Telephone Encounter (Signed)
Left a message for the patient to call back to schedule her for an appointment with Dr. Fletcher Anon.

## 2019-03-18 NOTE — Telephone Encounter (Signed)
Directions corrected and sent in, again.

## 2019-03-18 NOTE — Progress Notes (Signed)
Reviewed, agree

## 2019-03-18 NOTE — Addendum Note (Signed)
Addended by: Pilar Grammes on: 03/18/2019 03:01 PM   Modules accepted: Orders

## 2019-03-18 NOTE — Telephone Encounter (Signed)
Pt said the rx for gabapentin was not changed to gabapentin 300 mg taking one cap in AM and 2 caps at hs. Pt will be out of med after tonight. Pt request cb when done. [facility].

## 2019-03-19 LAB — HEMOGLOBIN A1C: Hemoglobin A1C: 7

## 2019-03-21 NOTE — Telephone Encounter (Signed)
No---we can skip the lab this time

## 2019-03-23 ENCOUNTER — Ambulatory Visit (INDEPENDENT_AMBULATORY_CARE_PROVIDER_SITE_OTHER): Payer: PPO | Admitting: Cardiovascular Disease

## 2019-03-23 ENCOUNTER — Encounter: Payer: Self-pay | Admitting: Cardiovascular Disease

## 2019-03-23 ENCOUNTER — Other Ambulatory Visit: Payer: Self-pay

## 2019-03-23 VITALS — BP 146/80 | HR 84 | Ht 61.0 in | Wt 156.5 lb

## 2019-03-23 DIAGNOSIS — I5032 Chronic diastolic (congestive) heart failure: Secondary | ICD-10-CM | POA: Diagnosis not present

## 2019-03-23 DIAGNOSIS — E785 Hyperlipidemia, unspecified: Secondary | ICD-10-CM | POA: Diagnosis not present

## 2019-03-23 DIAGNOSIS — I739 Peripheral vascular disease, unspecified: Secondary | ICD-10-CM

## 2019-03-23 DIAGNOSIS — I272 Pulmonary hypertension, unspecified: Secondary | ICD-10-CM

## 2019-03-23 DIAGNOSIS — J449 Chronic obstructive pulmonary disease, unspecified: Secondary | ICD-10-CM

## 2019-03-23 MED ORDER — ASPIRIN EC 81 MG PO TBEC: 81.0000 mg | DELAYED_RELEASE_TABLET | Freq: Every day | ORAL | 3 refills | Status: AC

## 2019-03-23 NOTE — Patient Instructions (Signed)
Medication Instructions:  Decrease the Aspirin to 81 mg once daily.  If you need a refill on your cardiac medications before your next appointment, please call your pharmacy.   Lab work: None ordered  Testing/Procedures: None ordered  Follow-Up: At [facility], you and your health needs are our priority.  As part of our continuing mission to provide you with exceptional heart care, we have created designated Provider Care Teams.  These Care Teams include your primary Cardiologist (physician) and Advanced Practice Providers (APPs -  Physician Assistants and Nurse Practitioners) who all work together to provide you with the care you need, when you need it. You will need a follow up appointment in 4 months.  Please call our office 2 months in advance to schedule this appointment.  You may see Kathlyn Sacramento, MD or one of the following Advanced Practice Providers on your designated Care Team:   Murray Hodgkins, NP Christell Faith, PA-C . Marrianne Mood, PA-C

## 2019-03-23 NOTE — Telephone Encounter (Signed)
I left a detailed message on patient's voice mail.

## 2019-03-23 NOTE — Progress Notes (Signed)
Cardiology Office Note   Date:  03/23/2019   ID:  Lindsay Harmon, DOB [birth year], MRN [id number]  PCP:  Venia Carbon, MD  Cardiologist:   Kathlyn Sacramento, MD   Chief Complaint  Patient presents with  . other    Follow up PAD; LE arterial follow up. Meds reviewed by the pt. verbally. Pt. c/o shortness of breath with over exertion.       History of Present Illness: Lindsay Harmon is a 63 y.o. female who was referred by Dr. Silvio Pate for evaluation and management of peripheral arterial disease.  She has known history of essential hypertension, diabetes mellitus, irritable bowel syndrome, previous tobacco use, depression, GERD, severe COPD, pulmonary hypertension with RV failure, moderate to severe aortic stenosis, carotid disease status post left carotid endarterectomy and chronic diastolic heart failure.  She is followed by the heart failure clinic with Dr. Haroldine Laws.  She recently complained of bilateral foot pain worse on the left side which started about 1 month ago.  She has significant numbness.  The pain does not worsen with exertion.  She also has chronic discoloration of both feet.  No ulceration.  She underwent recent noninvasive vascular studies which showed an ABI of 0.78 on the right and 0.73 on the left.  Duplex showed moderate diffuse right SFA disease and occluded mid left SFA.  She is limited by significant dyspnea and uses 4 or 5 L of oxygen.  No chest pain.  Past Medical History:  Diagnosis Date  . Allergy   . Aortic atherosclerosis ([place])   . Arthritis   . CKD (chronic kidney disease), stage IV ([place])   . COPD (chronic obstructive pulmonary disease) ([place])    a. 11/2014 PFT's: FEV1 0.87 (38%), FVC 1.41 (48%), FEF 25-75 0.41 (19%). Unable to perform DLCO; b. 12/2017 Simple spirometry ration 67%. FEV1 1.21 L+53% predicted. FVC 1.79L 61% predicted.  . Coronary artery calcification seen on CT scan    a. 03/2017 CT Chest.  . Depression   . Diabetic neuropathy ([place])    . Esophageal stricture   . Familial hematuria   . GERD (gastroesophageal reflux disease)   . Hyperlipidemia   . Hypertension   . IBS (irritable bowel syndrome)   . Nephrolithiasis   . NIDDM (non-insulin dependent diabetes mellitus)   . Obesity, unspecified   . Pulmonary hypertension ([place])    a. WHO Group 3; b. 10/2016 Echo: EF 55-60%, no rwma, Gr1 DD, mild to mod AS, mean grad 51mHg, mildly dil RV w/ mildly reduced RV fxn, mildly dil RA. PASP 1190mg; c. 12/2017 RHC: RA 11, RV 92/12, PA 92/33(57), PCWP 15, Fick CO/CI 5.9/3.3. PVR 7.2 WU. Ao 93%, PA 62/64%.  . Marland KitchenVD (peripheral vascular disease) [place]    Past Surgical History:  Procedure Laterality Date  . ABDOMINAL HYSTERECTOMY    . CARPAL TUNNEL RELEASE    . CESAREAN SECTION    . ERCP N/A 03/17/2017   Procedure: ENDOSCOPIC RETROGRADE CHOLANGIOPANCREATOGRAPHY (ERCP);  Surgeon: WoLucilla LameMD;  [facility];  Service: Endoscopy;  Laterality: N/A;  . EUS N/A 03/13/2017   Procedure: FULL UPPER ENDOSCOPIC ULTRASOUND (EUS) RADIAL;  Surgeon: Burbridge, ReMurray HodgkinsMD;  Location: [facility] ENDOSCOPY;  Service: Endoscopy;  Laterality: N/A;  . RIGHT HEART CATH N/A 01/22/2018   Procedure: RIGHT HEART CATH;  Surgeon: BeJolaine ArtistMD;  [facility];  Service: Cardiovascular;  Laterality: N/A;  . RIGHT HEART CATHETERIZATION N/A 12/05/2014   Procedure: RIGHT  HEART CATH;  Surgeon: Sinclair Grooms, MD;  Location[facility] C[facility] LAB;  Service: Cardiovascular;  Laterality: N/A;     Current Outpatient Medications  Medication Sig Dispense Refill  . albuterol (PROVENTIL) (2.5 MG/3ML) 0.083% nebulizer solution Take 3 mLs (2.5 mg total) by nebulization every 6 (six) hours as needed for wheezing or shortness of breath. 360 mL 5  . albuterol (VENTOLIN HFA) 108 (90 Base) MCG/ACT inhaler USE 2 PUFFS EVERY SIX HOURS AS NEEDED FOR WHEEZING OR SHORTNESS OF BREATH 18 g 5  . ALPRAZolam (XANAX) 0.25 MG tablet Take 1 tablet (0.25 mg total) by  mouth daily as needed for anxiety. 30 tablet 0  . calcitRIOL (ROCALTROL) 0.5 MCG capsule Take 0.5 mcg by mouth daily.     . cholecalciferol (VITAMIN D) 1000 units tablet Take 1,000 Units by mouth at bedtime.     . ferrous sulfate 325 (65 FE) MG tablet Take 325 mg by mouth daily with breakfast.    . gabapentin (NEURONTIN) 300 MG capsule Take 1 capsule in the am and 2 at bedtime. 90 capsule 11  . glucose blood (ONE TOUCH ULTRA TEST) test strip USE 1 STRIP TO TEST BLOOD SUGAR ONCE DAILY Dx Code E11.51 100 each 3  . hydrocortisone 2.5 % cream Apply topically 3 (three) times daily as needed. 28 g 3  . Insulin Glargine (LANTUS SOLOSTAR) 100 UNIT/ML Solostar Pen Inject 20 Units into the skin at bedtime. 9 pen 5  . Insulin Pen Needle (SURE COMFORT PEN NEEDLES) 31G X 5 MM MISC USE 1 PEN NEEDLE TO INJECT INSULIN 90 each 3  . loratadine (CLARITIN) 10 MG tablet Take 10 mg by mouth 2 (two) times daily.    Marland Kitchen lovastatin (MEVACOR) 40 MG tablet Take 2 tablets by mouth at bedtime 180 tablet 3  . methadone (DOLOPHINE) 10 MG tablet TAKE 2 TABLETS BY MOUTH 4 TIMES A DAY AS DIRECTED 240 tablet 0  . metoprolol tartrate (LOPRESSOR) 25 MG tablet Take 1/2 tablet by mouth twice a day 90 tablet 3  . mometasone (ASMANEX, 60 METERED DOSES,) 220 MCG/INH inhaler Inhale 2 puffs into the lungs 2 (two) times daily. 1 Inhaler 5  . nitroGLYCERIN (NITROSTAT) 0.4 MG SL tablet Place 1 tablet (0.4 mg total) under the tongue every 5 (five) minutes as needed for chest pain. 100 tablet 1  . ondansetron (ZOFRAN ODT) 4 MG disintegrating tablet Take 1 tablet (4 mg total) by mouth every 8 (eight) hours as needed for nausea or vomiting. 20 tablet 0  . pantoprazole (PROTONIX) 40 MG tablet Take 1 tablet by mouth twice a day 180 tablet 3  . Tiotropium Bromide-Olodaterol (STIOLTO RESPIMAT) 2.5-2.5 MCG/ACT AERS Inhale 2 puffs into the lungs daily. 4 g 5  . torsemide (DEMADEX) 20 MG tablet Take 2 tablets (40 mg total) by mouth daily. 180 tablet 3  .  Treprostinil (TYVASO) 0.6 MG/ML SOLN Inhale 18 mcg into the lungs 4 (four) times daily.    . valACYclovir (VALTREX) 1000 MG tablet Take 1,000 mg by mouth daily as needed.  3  . aspirin EC 81 MG tablet Take 1 tablet (81 mg total) by mouth daily. 90 tablet 3   No current facility-administered medications for this visit.     Allergies:   Sertraline hcl    Social History:  The patient  reports that she quit smoking about 13 years ago. Her smoking use included cigarettes. She has a 49.50 pack-year smoking history. She has never used smokeless tobacco. She reports  that she does not drink alcohol or use drugs.   Family History:  The patient's family history includes Allergies in her father and sister; Cancer in her father, maternal grandmother, and mother; Diabetes in her mother; Emphysema in her paternal grandfather.    ROS:  Please see the history of present illness.   Otherwise, review of systems are positive for none.   All other systems are reviewed and negative.    PHYSICAL EXAM: VS:  BP (!) 146/80 (BP Location: Right Arm, Patient Position: Sitting, Cuff Size: Normal)   Pulse 84   Ht _0  (1.549 m)   Wt 156 lb 8 oz (71 kg)   BMI 29.57 kg/m  , BMI Body mass index is 29.57 kg/m. GEN: Frail looking female in no acute distress.  On supplemental oxygen. HEENT: normal  Neck: no JVD.  Positive carotid bruits Cardiac: RRR; no  rubs, or gallops.  3 out of 6 crescendo decrescendo systolic murmur in the aortic area which is late peaking with diminished S2.  There is trace bilateral leg edema Respiratory:   Diminished breath sounds bilaterally GI: soft, nontender, nondistended, + BS MS: no deformity or atrophy  Skin: warm and dry, no rash Neuro:  Strength and sensation are intact Psych: euthymic mood, full affect Vascular: Distal pulses are not palpable.  She has mild dependent rubor but she also has significant varicose veins with chronic discoloration.   EKG:  EKG is ordered today. The  ekg ordered today demonstrates normal sinus rhythm with left atrial enlargement.  Inferior lateral ST changes suggestive of ischemia.   Recent Labs: 04/19/2018: B Natriuretic Peptide 1,679.0 04/20/2018: Magnesium 1.4; TSH 1.462 07/28/2018: ALT 14; BUN 27; Creatinine, Ser 1.76; Potassium 3.7; Sodium 139 12/15/2018: Hemoglobin 15.2; Platelets 201    Lipid Panel    Component Value Date/Time   CHOL 120 10/22/2017 1146   TRIG 151.0 (H) 10/22/2017 1146   HDL 34.90 (L) 10/22/2017 1146   CHOLHDL 3 10/22/2017 1146   VLDL 30.2 10/22/2017 1146   LDLCALC 54 10/22/2017 1146   LDLDIRECT 65.0 10/23/2016 1044      Wt Readings from Last 3 Encounters:  03/23/19 156 lb 8 oz (71 kg)  03/03/19 152 lb (68.9 kg)  03/01/19 153 lb (69.4 kg)        PAD Screen 03/23/2019  Previous PAD dx? No  Previous surgical procedure? No  Pain with walking? No  Subsides with rest? No  Feet/toe relief with dangling? No  Painful, non-healing ulcers? No  Extremities discolored? Yes      ASSESSMENT AND PLAN:  1.  Peripheral arterial disease: Moderately reduced ABI bilaterally with SFA disease.  I discussed with her the natural history and management of peripheral arterial disease.  Given her significant comorbidities, we should reserve angiography and revascularization to worsening leg ischemia manifested by critical limb ischemia.  For now, I recommend continuing medical therapy.  Given significant heart failure, cilostazol is contraindicated.  I asked her to decrease aspirin to 81 mg once daily. I discussed with her the importance of proper foot hygiene.  2.  Severe COPD with pulmonary hypertension: Followed by Dr. Haroldine Laws and Dr. Lake Bells.  3.  Chronic diastolic heart failure: She seems to be euvolemic on current dose of torsemide.  4.  Hyperlipidemia: Continue treatment with lovastatin.  Most recent LDL was 54.    Disposition:   FU with me in 4 months  Signed,  Kathlyn Sacramento, MD  03/23/2019 11:28 AM     [place]  [facility]

## 2019-03-24 ENCOUNTER — Ambulatory Visit (INDEPENDENT_AMBULATORY_CARE_PROVIDER_SITE_OTHER): Payer: PPO | Admitting: Internal Medicine

## 2019-03-24 ENCOUNTER — Encounter: Payer: Self-pay | Admitting: Internal Medicine

## 2019-03-24 VITALS — Ht 60.0 in | Wt 154.0 lb

## 2019-03-24 DIAGNOSIS — I5032 Chronic diastolic (congestive) heart failure: Secondary | ICD-10-CM

## 2019-03-24 DIAGNOSIS — G8929 Other chronic pain: Secondary | ICD-10-CM

## 2019-03-24 DIAGNOSIS — M5440 Lumbago with sciatica, unspecified side: Secondary | ICD-10-CM | POA: Diagnosis not present

## 2019-03-24 DIAGNOSIS — F1120 Opioid dependence, uncomplicated: Secondary | ICD-10-CM

## 2019-03-24 DIAGNOSIS — E1149 Type 2 diabetes mellitus with other diabetic neurological complication: Secondary | ICD-10-CM | POA: Diagnosis not present

## 2019-03-24 DIAGNOSIS — M5442 Lumbago with sciatica, left side: Secondary | ICD-10-CM

## 2019-03-24 NOTE — Progress Notes (Signed)
Subjective:    Patient ID: Lindsay Harmon, female    DOB: 04-04-1956, 63 y.o.   MRN: [id number]  HPI Follow up of chronic pain and narcotic dependence as well as diabetes and other chronic health conditions  Interactive audio and video telecommunications were attempted between this provider and patient, however failed, due to patient having technical difficulties OR patient did not have access to video capability.  We continued and completed visit with audio only.   Virtual Visit via Telephone Note  I connected with Lindsay Harmon on 03/24/19 at 10:15 AM EDT by telephone and verified that I am speaking with the correct person using two identifiers.  Location: Patient: home Provider: office   I discussed the limitations, risks, security and privacy concerns of performing an evaluation and management service by telephone and the availability of in person appointments. I also discussed with the patient that there may be a patient responsible charge related to this service. The patient expressed understanding and agreed to proceed.   History of Present Illness: She is doing okay Did see Dr Fletcher Anon yesterday about the PAD---no angiography for now (unless non healing ulcer or rest pain)  Breathing has been okay Sleeps well---better than before No edema now No chest pain  Chronic back pain is about the same Uses the methadone at the same dosage  Checks sugars daily Usually under 130 and occasionally under 110 Rarely over 150 A1c done POC by home nurse--7.0%  Still bruises easily Careful to avoid trauma No longer seeing GI about the cirrhosis  Current Outpatient Medications on File Prior to Visit  Medication Sig Dispense Refill  . albuterol (PROVENTIL) (2.5 MG/3ML) 0.083% nebulizer solution Take 3 mLs (2.5 mg total) by nebulization every 6 (six) hours as needed for wheezing or shortness of breath. 360 mL 5  . albuterol (VENTOLIN HFA) 108 (90 Base) MCG/ACT inhaler USE 2 PUFFS  EVERY SIX HOURS AS NEEDED FOR WHEEZING OR SHORTNESS OF BREATH 18 g 5  . ALPRAZolam (XANAX) 0.25 MG tablet Take 1 tablet (0.25 mg total) by mouth daily as needed for anxiety. 30 tablet 0  . aspirin EC 81 MG tablet Take 1 tablet (81 mg total) by mouth daily. 90 tablet 3  . calcitRIOL (ROCALTROL) 0.5 MCG capsule Take 0.5 mcg by mouth daily.     . cholecalciferol (VITAMIN D) 1000 units tablet Take 1,000 Units by mouth at bedtime.     . ferrous sulfate 325 (65 FE) MG tablet Take 325 mg by mouth daily with breakfast.    . gabapentin (NEURONTIN) 300 MG capsule Take 1 capsule in the am and 2 at bedtime. 90 capsule 11  . glucose blood (ONE TOUCH ULTRA TEST) test strip USE 1 STRIP TO TEST BLOOD SUGAR ONCE DAILY Dx Code E11.51 100 each 3  . hydrocortisone 2.5 % cream Apply topically 3 (three) times daily as needed. 28 g 3  . Insulin Glargine (LANTUS SOLOSTAR) 100 UNIT/ML Solostar Pen Inject 20 Units into the skin at bedtime. 9 pen 5  . Insulin Pen Needle (SURE COMFORT PEN NEEDLES) 31G X 5 MM MISC USE 1 PEN NEEDLE TO INJECT INSULIN 90 each 3  . loratadine (CLARITIN) 10 MG tablet Take 10 mg by mouth 2 (two) times daily.    Marland Kitchen lovastatin (MEVACOR) 40 MG tablet Take 2 tablets by mouth at bedtime 180 tablet 3  . methadone (DOLOPHINE) 10 MG tablet TAKE 2 TABLETS BY MOUTH 4 TIMES A DAY AS DIRECTED 240 tablet 0  .  metoprolol tartrate (LOPRESSOR) 25 MG tablet Take 1/2 tablet by mouth twice a day 90 tablet 3  . mometasone (ASMANEX, 60 METERED DOSES,) 220 MCG/INH inhaler Inhale 2 puffs into the lungs 2 (two) times daily. 1 Inhaler 5  . nitroGLYCERIN (NITROSTAT) 0.4 MG SL tablet Place 1 tablet (0.4 mg total) under the tongue every 5 (five) minutes as needed for chest pain. 100 tablet 1  . ondansetron (ZOFRAN ODT) 4 MG disintegrating tablet Take 1 tablet (4 mg total) by mouth every 8 (eight) hours as needed for nausea or vomiting. 20 tablet 0  . pantoprazole (PROTONIX) 40 MG tablet Take 1 tablet by mouth twice a day 180  tablet 3  . Tiotropium Bromide-Olodaterol (STIOLTO RESPIMAT) 2.5-2.5 MCG/ACT AERS Inhale 2 puffs into the lungs daily. 4 g 5  . Treprostinil (TYVASO) 0.6 MG/ML SOLN Inhale 18 mcg into the lungs 4 (four) times daily.    . valACYclovir (VALTREX) 1000 MG tablet Take 1,000 mg by mouth daily as needed.  3  . torsemide (DEMADEX) 20 MG tablet Take 2 tablets (40 mg total) by mouth daily. 180 tablet 3   No current facility-administered medications on file prior to visit.     Allergies  Allergen Reactions  . Sertraline Hcl Other (See Comments)    Cloudy thoughts, malaise    Past Medical History:  Diagnosis Date  . Allergy   . Aortic atherosclerosis ([place])   . Arthritis   . CKD (chronic kidney disease), stage IV ([place])   . COPD (chronic obstructive pulmonary disease) ([place])    a. 11/2014 PFT's: FEV1 0.87 (38%), FVC 1.41 (48%), FEF 25-75 0.41 (19%). Unable to perform DLCO; b. 12/2017 Simple spirometry ration 67%. FEV1 1.21 L+53% predicted. FVC 1.79L 61% predicted.  . Coronary artery calcification seen on CT scan    a. 03/2017 CT Chest.  . Depression   . Diabetic neuropathy ([place])   . Esophageal stricture   . Familial hematuria   . GERD (gastroesophageal reflux disease)   . Hyperlipidemia   . Hypertension   . IBS (irritable bowel syndrome)   . Nephrolithiasis   . NIDDM (non-insulin dependent diabetes mellitus)   . Obesity, unspecified   . Pulmonary hypertension ([place])    a. WHO Group 3; b. 10/2016 Echo: EF 55-60%, no rwma, Gr1 DD, mild to mod AS, mean grad 31mHg, mildly dil RV w/ mildly reduced RV fxn, mildly dil RA. PASP 1156mg; c. 12/2017 RHC: RA 11, RV 92/12, PA 92/33(57), PCWP 15, Fick CO/CI 5.9/3.3. PVR 7.2 WU. Ao 93%, PA 62/64%.  . Marland KitchenVD (peripheral vascular disease) [place]    Past Surgical History:  Procedure Laterality Date  . ABDOMINAL HYSTERECTOMY    . CARPAL TUNNEL RELEASE    . CESAREAN SECTION    . ERCP N/A 03/17/2017   Procedure: ENDOSCOPIC RETROGRADE CHOLANGIOPANCREATOGRAPHY  (ERCP);  Surgeon: WoLucilla LameMD;  [facility];  Service: Endoscopy;  Laterality: N/A;  . EUS N/A 03/13/2017   Procedure: FULL UPPER ENDOSCOPIC ULTRASOUND (EUS) RADIAL;  Surgeon: Burbridge, ReMurray HodgkinsMD;  Location: [facility] ENDOSCOPY;  Service: Endoscopy;  Laterality: N/A;  . RIGHT HEART CATH N/A 01/22/2018   Procedure: RIGHT HEART CATH;  Surgeon: BeJolaine ArtistMD;  [facility];  Service: Cardiovascular;  Laterality: N/A;  . RIGHT HEART CATHETERIZATION N/A 12/05/2014   Procedure: RIGHT HEART CATH;  Surgeon: HeSinclair GroomsMD;  [facility];  Service: Cardiovascular;  Laterality: N/A;    Family History  Problem  Relation Age of Onset  . Diabetes Mother   . Cancer Mother        ovarian,melanoma  . Allergies Father   . Cancer Father        lung  . Cancer Maternal Grandmother        uterine  . Emphysema Paternal Grandfather   . Allergies Sister   . Breast cancer Neg Hx     Social History   Socioeconomic History  . Marital status: Legally Separated    Spouse name: Not on file  . Number of children: 1  . Years of education: Not on file  . Highest education level: Not on file  Occupational History  . 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 Financial resource strain: Not on file  . Food insecurity:    Worry: Not on file    Inability: Not on file  . Transportation needs:    Medical: Not on file    Non-medical: Not on file  Tobacco Use  . Smoking status: Former Smoker    Packs/day: 1.50    Years: 33.00    Pack years: 49.50    Types: Cigarettes    Last attempt to quit: 07/28/2005    Years since quitting: 13.6  . Smokeless tobacco: Never Used  Substance and Sexual Activity  . Alcohol use: No    Alcohol/week: 0.0 standard drinks    Comment: heavy in the past  . Drug use: No  . Sexual activity: Never  Lifestyle  . Physical activity:    Days per week: Not on file    Minutes per  session: Not on file  . Stress: Not on file  Relationships  . Social connections:    Talks on phone: Not on file    Gets together: Not on file    Attends religious service: Not on file    Active member of club or organization: Not on file    Attends meetings of clubs or organizations: Not on file    Relationship status: Not on file  . Intimate partner violence:    Fear of current or ex partner: Not on file    Emotionally abused: Not on file    Physically abused: Not on file    Forced sexual activity: Not on file  Other Topics Concern  . Not on file  Social History Narrative   No living will   Son [name] (then mom) should make decisions for her if she is unable.    Would accept resuscitation attempts but no prolonged ventilation   Not sure about tube feeds but probably wouldn't want them if cognitively unaware     Observations/Objective: Sounds fine No respiratory difficulty Mood seems fairly upbeat  Assessment and Plan: See problem list  Follow Up Instructions:    I discussed the assessment and treatment plan with the patient. The patient was provided an opportunity to ask questions and all were answered. The patient agreed with the plan and demonstrated an understanding of the instructions.   The patient was advised to call back or seek an in-person evaluation if the symptoms worsen or if the condition fails to improve as anticipated.  I provided 12 minutes of non-face-to-face time during this encounter.   Viviana Simpler, MD    Review of Systems     Objective:   Physical Exam         Assessment & Plan:

## 2019-03-24 NOTE — Assessment & Plan Note (Signed)
This is stable No functional changes due to pain (and doing better from respiratory standpoint)

## 2019-03-24 NOTE — Assessment & Plan Note (Signed)
PDMP reviewed No concerns

## 2019-03-24 NOTE — Assessment & Plan Note (Signed)
Compensated now with control of the pulmonary hypertension

## 2019-03-24 NOTE — Assessment & Plan Note (Signed)
Lab Results  Component Value Date   HGBA1C 7.0 03/19/2019   Still with good control No changes needed

## 2019-04-03 DIAGNOSIS — J439 Emphysema, unspecified: Secondary | ICD-10-CM | POA: Diagnosis not present

## 2019-04-05 ENCOUNTER — Other Ambulatory Visit: Payer: Self-pay | Admitting: *Deleted

## 2019-04-05 MED ORDER — METHADONE HCL 10 MG PO TABS: 20.0000 mg | ORAL_TABLET | Freq: Four times a day (QID) | ORAL | 0 refills | Status: DC | PRN

## 2019-04-05 NOTE — Telephone Encounter (Signed)
Patient left a voicemail requesting refill  Name of Medication: Methadone Name of Pharmacy: [facility] or Written Date and Quantity:02/24/19 #240  Last Office Visit and Type: 03/24/19 pain management Next Office Visit and Type: 06/25/19 pain management Last Controlled Substance Agreement Date: 07/14/18 Last UDS:07/14/18

## 2019-04-12 DIAGNOSIS — E1122 Type 2 diabetes mellitus with diabetic chronic kidney disease: Secondary | ICD-10-CM | POA: Diagnosis not present

## 2019-04-12 DIAGNOSIS — R600 Localized edema: Secondary | ICD-10-CM | POA: Diagnosis not present

## 2019-04-12 DIAGNOSIS — N184 Chronic kidney disease, stage 4 (severe): Secondary | ICD-10-CM | POA: Diagnosis not present

## 2019-04-12 DIAGNOSIS — N2581 Secondary hyperparathyroidism of renal origin: Secondary | ICD-10-CM | POA: Diagnosis not present

## 2019-04-12 DIAGNOSIS — R809 Proteinuria, unspecified: Secondary | ICD-10-CM | POA: Diagnosis not present

## 2019-04-12 DIAGNOSIS — E871 Hypo-osmolality and hyponatremia: Secondary | ICD-10-CM | POA: Diagnosis not present

## 2019-04-16 ENCOUNTER — Telehealth: Payer: Self-pay | Admitting: Internal Medicine

## 2019-04-16 NOTE — Telephone Encounter (Signed)
Left message for pt on VM. Advised her that insurance will not cover more than 1 a day if she is not insulin dependent and they do count Lantus as insulin dependent. I told her I could send in a rx for a new meter if she would like.

## 2019-04-16 NOTE — Telephone Encounter (Signed)
Pt is requesting a refill of test strips States she needs Sig updated to get more strips/Qty Currently getting #100 but this is not enough.  Pt is having trouble with her machine and is running out of strips before next refill - sometime having to waste strips d/t the machine malfunctioning.   glucose blood (ONE TOUCH ULTRA TEST) test strip [[id number]]  Dose, Route, Frequency: As Directed  Dispense Quantity: 100 each Refills: 3 Fills remaining: --        Sig: USE 1 STRIP TO TEST BLOOD SUGAR ONCE DAILY Dx Code E11.51     Pharmacy:  [facility], [address]   Please advise.

## 2019-04-19 MED ORDER — GLUCOSE BLOOD VI STRP: ORAL_STRIP | 3 refills | Status: DC

## 2019-04-19 NOTE — Telephone Encounter (Signed)
Spoke to pt. She said she did not get my message last week. I told her about the insurance and that she will have to purchase a box without insurance. She stated she understood.

## 2019-04-19 NOTE — Telephone Encounter (Signed)
Pt left v/m that pharmacy said too early to get refill but pt only has 2 strips left/ pt request cb with what to do.

## 2019-04-19 NOTE — Telephone Encounter (Signed)
I sent the rx to [facility] but I am afraid insurance will deny it.

## 2019-04-26 ENCOUNTER — Other Ambulatory Visit: Payer: Self-pay | Admitting: *Deleted

## 2019-04-26 MED ORDER — METOPROLOL TARTRATE 25 MG PO TABS: 12.5000 mg | ORAL_TABLET | Freq: Two times a day (BID) | ORAL | 3 refills | Status: DC

## 2019-04-26 NOTE — Telephone Encounter (Signed)
Patient left a voicemail stating that her pharmacy [facility] has sent over a refill request on her Metoprolol 25 mg. Patient requested that a refill be sent in.

## 2019-04-26 NOTE — Telephone Encounter (Signed)
Rx sent electronically.

## 2019-05-03 ENCOUNTER — Telehealth: Payer: Self-pay | Admitting: *Deleted

## 2019-05-03 DIAGNOSIS — Z122 Encounter for screening for malignant neoplasm of respiratory organs: Secondary | ICD-10-CM

## 2019-05-03 DIAGNOSIS — J439 Emphysema, unspecified: Secondary | ICD-10-CM | POA: Diagnosis not present

## 2019-05-03 DIAGNOSIS — Z87891 Personal history of nicotine dependence: Secondary | ICD-10-CM

## 2019-05-03 NOTE — Telephone Encounter (Signed)
Patient has been notified that annual lung cancer screening low dose CT scan is due currently or will be in near future. Confirmed that patient is within the age range of 55-77, and asymptomatic, (no signs or symptoms of lung cancer). Patient denies illness that would prevent curative treatment for lung cancer if found. Verified smoking history, (former, quit 01/04/09, 30 pack year). The shared decision making visit was done 04/09/17. Patient is agreeable for CT scan being scheduled.

## 2019-05-07 ENCOUNTER — Telehealth: Payer: Self-pay | Admitting: Primary Care

## 2019-05-07 MED ORDER — STIOLTO RESPIMAT 2.5-2.5 MCG/ACT IN AERS: 2.0000 | INHALATION_SPRAY | Freq: Every day | RESPIRATORY_TRACT | 3 refills | Status: DC

## 2019-05-07 NOTE — Telephone Encounter (Signed)
Called and spoke with patient. Sent in order for stiolto per last OV with Derl Barrow NP (03/17/19) to continue medication. Patient verified pharmacy. Nothing further needed.

## 2019-05-10 ENCOUNTER — Ambulatory Visit
Admission: RE | Admit: 2019-05-10 | Discharge: 2019-05-10 | Disposition: A | Discharge status: Home / Self Care | Payer: PPO | Source: Ambulatory Visit | Attending: Nurse Practitioner | Admitting: Nurse Practitioner

## 2019-05-10 ENCOUNTER — Other Ambulatory Visit: Payer: Self-pay

## 2019-05-10 DIAGNOSIS — Z87891 Personal history of nicotine dependence: Secondary | ICD-10-CM

## 2019-05-10 DIAGNOSIS — Z122 Encounter for screening for malignant neoplasm of respiratory organs: Secondary | ICD-10-CM | POA: Diagnosis not present

## 2019-05-10 MED ORDER — METHADONE HCL 10 MG PO TABS: 20.0000 mg | ORAL_TABLET | Freq: Four times a day (QID) | ORAL | 0 refills | Status: DC | PRN

## 2019-05-10 NOTE — Telephone Encounter (Signed)
Name of Medication: methadone 10 mg Name of Pharmacy: [facility] or Written Date and Quantity: # 240 on 04/05/19 Last Office Visit and Type: 03/24/19 for 3 mth FU Next Office Visit and Type: 3 mth FU on 06/25/19 Last Controlled Substance Agreement Date: 07/17/18 Last UDS:07/14/18

## 2019-05-12 ENCOUNTER — Encounter: Payer: Self-pay | Admitting: *Deleted

## 2019-06-03 DIAGNOSIS — J439 Emphysema, unspecified: Secondary | ICD-10-CM | POA: Diagnosis not present

## 2019-06-07 ENCOUNTER — Other Ambulatory Visit: Payer: Self-pay

## 2019-06-07 MED ORDER — METHADONE HCL 10 MG PO TABS: 20.0000 mg | ORAL_TABLET | Freq: Four times a day (QID) | ORAL | 0 refills | Status: DC | PRN

## 2019-06-07 NOTE — Telephone Encounter (Signed)
Name of Medication: methadone 10 mg Name of Pharmacy: [facility] or Written Date and Quantity: #240 on 05/10/19 Last Office Visit and Type:03/24/19 for 3 mth pain mgt Next Office Visit and Type:06/25/19 for 3 mth FU pain mgt  Last Controlled Substance Agreement Date:07/17/18  Last UDS:07/14/18.

## 2019-06-14 ENCOUNTER — Other Ambulatory Visit: Payer: Self-pay

## 2019-06-14 ENCOUNTER — Inpatient Hospital Stay: Payer: PPO | Attending: Oncology

## 2019-06-14 ENCOUNTER — Inpatient Hospital Stay (HOSPITAL_BASED_OUTPATIENT_CLINIC_OR_DEPARTMENT_OTHER): Payer: PPO | Admitting: Oncology

## 2019-06-14 ENCOUNTER — Encounter: Payer: Self-pay | Admitting: Oncology

## 2019-06-14 VITALS — BP 133/79 | HR 89 | Temp 97.6°F | Resp 20 | Ht 61.5 in | Wt 158.1 lb

## 2019-06-14 DIAGNOSIS — J449 Chronic obstructive pulmonary disease, unspecified: Secondary | ICD-10-CM | POA: Insufficient documentation

## 2019-06-14 DIAGNOSIS — E871 Hypo-osmolality and hyponatremia: Secondary | ICD-10-CM | POA: Diagnosis not present

## 2019-06-14 DIAGNOSIS — Z79899 Other long term (current) drug therapy: Secondary | ICD-10-CM | POA: Diagnosis not present

## 2019-06-14 DIAGNOSIS — D751 Secondary polycythemia: Secondary | ICD-10-CM | POA: Insufficient documentation

## 2019-06-14 DIAGNOSIS — F329 Major depressive disorder, single episode, unspecified: Secondary | ICD-10-CM | POA: Diagnosis not present

## 2019-06-14 DIAGNOSIS — N2581 Secondary hyperparathyroidism of renal origin: Secondary | ICD-10-CM | POA: Insufficient documentation

## 2019-06-14 DIAGNOSIS — Z7951 Long term (current) use of inhaled steroids: Secondary | ICD-10-CM | POA: Insufficient documentation

## 2019-06-14 DIAGNOSIS — Z794 Long term (current) use of insulin: Secondary | ICD-10-CM | POA: Diagnosis not present

## 2019-06-14 DIAGNOSIS — Z7982 Long term (current) use of aspirin: Secondary | ICD-10-CM | POA: Insufficient documentation

## 2019-06-14 DIAGNOSIS — Z87891 Personal history of nicotine dependence: Secondary | ICD-10-CM | POA: Diagnosis not present

## 2019-06-14 DIAGNOSIS — I10 Essential (primary) hypertension: Secondary | ICD-10-CM | POA: Insufficient documentation

## 2019-06-14 DIAGNOSIS — D638 Anemia in other chronic diseases classified elsewhere: Secondary | ICD-10-CM | POA: Diagnosis not present

## 2019-06-14 DIAGNOSIS — N184 Chronic kidney disease, stage 4 (severe): Secondary | ICD-10-CM | POA: Insufficient documentation

## 2019-06-14 DIAGNOSIS — D571 Sickle-cell disease without crisis: Secondary | ICD-10-CM | POA: Diagnosis present

## 2019-06-14 LAB — CBC WITH DIFFERENTIAL/PLATELET
Abs Immature Granulocytes: 0.03 10*3/uL (ref 0.00–0.07)
Basophils Absolute: 0.1 10*3/uL (ref 0.0–0.1)
Basophils Relative: 1 %
Eosinophils Absolute: 0.2 10*3/uL (ref 0.0–0.5)
Eosinophils Relative: 3 %
HCT: 46.6 % — ABNORMAL HIGH (ref 36.0–46.0)
Hemoglobin: 15.2 g/dL — ABNORMAL HIGH (ref 12.0–15.0)
Immature Granulocytes: 0 %
Lymphocytes Relative: 16 %
Lymphs Abs: 1.4 10*3/uL (ref 0.7–4.0)
MCH: 30.2 pg (ref 26.0–34.0)
MCHC: 32.6 g/dL (ref 30.0–36.0)
MCV: 92.5 fL (ref 80.0–100.0)
Monocytes Absolute: 0.4 10*3/uL (ref 0.1–1.0)
Monocytes Relative: 5 %
Neutro Abs: 6.6 10*3/uL (ref 1.7–7.7)
Neutrophils Relative %: 75 %
Platelets: 190 10*3/uL (ref 150–400)
RBC: 5.04 MIL/uL (ref 3.87–5.11)
RDW: 13.6 % (ref 11.5–15.5)
WBC: 8.7 10*3/uL (ref 4.0–10.5)
nRBC: 0 % (ref 0.0–0.2)

## 2019-06-14 NOTE — Progress Notes (Signed)
Reports no new issues today. Here for follow up.

## 2019-06-14 NOTE — Progress Notes (Signed)
Hematology/Oncology follow up note [facility] [phone] [phone]   Patient Care Team: Venia Carbon, MD as PCP - General Bensimhon, Shaune Pascal, MD as PCP - Cardiology (Cardiology) Bensimhon, Shaune Pascal, MD as Consulting Physician (Cardiology) Juanito Doom, MD as Consulting Physician (Pulmonary Disease) Anthonette Legato, MD as Consulting Physician (Internal Medicine) Joan Flores Richard Miu, DO as Consulting Physician (Optometry)  REFERRING PROVIDER: Dr.Lateef REASON FOR VISIT Follow up for treatment of hyponatremia and hypotonic  HISTORY OF PRESENTING ILLNESS:  Lindsay Harmon is a  63 y.o.  female with PMH listed below who was referred to me for evaluation of hyponatremia.  Patient has history of chronic kidney disease stage IV, proteinuria, hyponatremia lower extremity edema, secondary hyperparathyroidism and anemia of chronic disease follows up with Dr. Holley Raring.  Extensive medical records review was performed.  Patient has a history of hyponatremia and has been maintained on torsemide and fluid restriction.  Recent serum sodium has been stable. Reviewed patient's previous lab results since 2008 in [place].  She has a history of chronic hyponatremia in 2016 with sodium level running low around 120s.  Hyponatremia was considered to be secondary to volume overload/CHF compounded by Aldactone. Patient was transitioned to Lasix and then later switched to Torsemide  Sodium level appears to improve in 2017 and 2018 with average level around 1 30-1 35.  This year patient's hyponatremia has been well controlled with all readings above 130.  She has chronic respiratory failure and on home oxygen. She reports feeling fatigue, which is chronic for her.   INTERVAL HISTORY JAUNITA MIKELS is a 63 y.o. female who has above history reviewed by me today presents for follow up visit for management of erythrocytosis. Patient reports no new complaints today.  Chronic COPD and respiratory failure on home oxygen. She feels that wearing mask is exacerbating some of her shortness of breath, worsening with exertion. Denies any fever, chills, leg swelling, chest pain or abdominal pain. Chronic fatigue at baseline . Review of Systems  Constitutional: Positive for malaise/fatigue. Negative for chills, fever and weight loss.  HENT: Negative for nosebleeds and sore throat.   Eyes: Negative for double vision, photophobia and redness.  Respiratory: Positive for shortness of breath. Negative for cough, hemoptysis and wheezing.   Cardiovascular: Negative for chest pain, palpitations, orthopnea and leg swelling.  Gastrointestinal: Negative for abdominal pain, blood in stool, nausea and vomiting.  Genitourinary: Negative for dysuria.  Musculoskeletal: Negative for back pain, myalgias and neck pain.  Skin: Negative for itching and rash.  Neurological: Negative for dizziness, tingling and tremors.  Endo/Heme/Allergies: Negative for environmental allergies. Does not bruise/bleed easily.  Psychiatric/Behavioral: Negative for depression and hallucinations.    MEDICAL HISTORY:  Past Medical History:  Diagnosis Date  . Allergy   . Aortic atherosclerosis ([place])   . Arthritis   . CKD (chronic kidney disease), stage IV ([place])   . COPD (chronic obstructive pulmonary disease) ([place])    a. 11/2014 PFT's: FEV1 0.87 (38%), FVC 1.41 (48%), FEF 25-75 0.41 (19%). Unable to perform DLCO; b. 12/2017 Simple spirometry ration 67%. FEV1 1.21 L+53% predicted. FVC 1.79L 61% predicted.  . Coronary artery calcification seen on CT scan    a. 03/2017 CT Chest.  . Depression   . Diabetic neuropathy ([place])   . Esophageal stricture   . Familial hematuria   . GERD (gastroesophageal reflux disease)   . Hyperlipidemia   . Hypertension   . IBS (irritable bowel syndrome)   . Nephrolithiasis   .  NIDDM (non-insulin dependent diabetes mellitus)   . Obesity, unspecified   . Pulmonary  hypertension ([place])    a. WHO Group 3; b. 10/2016 Echo: EF 55-60%, no rwma, Gr1 DD, mild to mod AS, mean grad 76mHg, mildly dil RV w/ mildly reduced RV fxn, mildly dil RA. PASP 1155mg; c. 12/2017 RHC: RA 11, RV 92/12, PA 92/33(57), PCWP 15, Fick CO/CI 5.9/3.3. PVR 7.2 WU. Ao 93%, PA 62/64%.  . Marland KitchenVD (peripheral vascular disease) ([place]    SURGICAL HISTORY: Past Surgical History:  Procedure Laterality Date  . ABDOMINAL HYSTERECTOMY    . CARPAL TUNNEL RELEASE    . CESAREAN SECTION    . ERCP N/A 03/17/2017   Procedure: ENDOSCOPIC RETROGRADE CHOLANGIOPANCREATOGRAPHY (ERCP);  Surgeon: WoLucilla LameMD;  [facility];  Service: Endoscopy;  Laterality: N/A;  . EUS N/A 03/13/2017   Procedure: FULL UPPER ENDOSCOPIC ULTRASOUND (EUS) RADIAL;  Surgeon: Burbridge, ReMurray HodgkinsMD;  [facility];  Service: Endoscopy;  Laterality: N/A;  . RIGHT HEART CATH N/A 01/22/2018   Procedure: RIGHT HEART CATH;  Surgeon: BeJolaine ArtistMD;  [facility];  Service: Cardiovascular;  Laterality: N/A;  . RIGHT HEART CATHETERIZATION N/A 12/05/2014   Procedure: RIGHT HEART CATH;  Surgeon: HeSinclair GroomsMD;  [facility];  Service: Cardiovascular;  Laterality: N/A;    SOCIAL HISTORY: Social History   Socioeconomic History  . Marital status: Legally Separated    Spouse name: Not on file  . Number of children: 1  . Years of education: Not on file  . Highest education level: Not on file  Occupational History  . 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 Financial resource strain: Not on file  . Food insecurity    Worry: Not on file    Inability: Not on file  . Transportation needs    Medical: Not on file    Non-medical: Not on file  Tobacco Use  . Smoking status: Former Smoker    Packs/day: 1.50    Years: 33.00    Pack years: 49.50    Types: Cigarettes    Quit date: 07/28/2005    Years since quitting: 13.8  .  Smokeless tobacco: Never Used  Substance and Sexual Activity  . Alcohol use: No    Alcohol/week: 0.0 standard drinks    Comment: heavy in the past  . Drug use: No  . Sexual activity: Never  Lifestyle  . Physical activity    Days per week: Not on file    Minutes per session: Not on file  . Stress: Not on file  Relationships  . Social coHerbalistn phone: Not on file    Gets together: Not on file    Attends religious service: Not on file    Active member of club or organization: Not on file    Attends meetings of clubs or organizations: Not on file    Relationship status: Not on file  . Intimate partner violence    Fear of current or ex partner: Not on file    Emotionally abused: Not on file    Physically abused: Not on file    Forced sexual activity: Not on file  Other Topics Concern  . Not on file  Social History Narrative   No living will   Son JoVonna Kotykthen mom) should make decisions for her if she is unable.  Would accept resuscitation attempts but no prolonged ventilation   Not sure about tube feeds but probably wouldn't want them if cognitively unaware    FAMILY HISTORY: Family History  Problem Relation Age of Onset  . Diabetes Mother   . Cancer Mother        ovarian,melanoma  . Allergies Father   . Cancer Father        lung  . Cancer Maternal Grandmother        uterine  . Emphysema Paternal Grandfather   . Allergies Sister   . Breast cancer Neg Hx     ALLERGIES:  is allergic to sertraline hcl.  MEDICATIONS:  Current Outpatient Medications  Medication Sig Dispense Refill  . albuterol (PROVENTIL) (2.5 MG/3ML) 0.083% nebulizer solution Take 3 mLs (2.5 mg total) by nebulization every 6 (six) hours as needed for wheezing or shortness of breath. 360 mL 5  . albuterol (VENTOLIN HFA) 108 (90 Base) MCG/ACT inhaler USE 2 PUFFS EVERY SIX HOURS AS NEEDED FOR WHEEZING OR SHORTNESS OF BREATH 18 g 5  . aspirin EC 81 MG tablet Take 1 tablet (81 mg total) by  mouth daily. 90 tablet 3  . calcitRIOL (ROCALTROL) 0.5 MCG capsule Take 0.5 mcg by mouth daily.     . cholecalciferol (VITAMIN D) 1000 units tablet Take 1,000 Units by mouth at bedtime.     . ferrous sulfate 325 (65 FE) MG tablet Take 325 mg by mouth daily with breakfast.    . gabapentin (NEURONTIN) 300 MG capsule Take 1 capsule in the am and 2 at bedtime. 90 capsule 11  . glucose blood (ONE TOUCH ULTRA TEST) test strip USE 1 STRIP TO TEST BLOOD SUGAR ONCE DAILY Dx Code E11.51 100 each 3  . hydrocortisone 2.5 % cream Apply topically 3 (three) times daily as needed. 28 g 3  . Insulin Glargine (LANTUS SOLOSTAR) 100 UNIT/ML Solostar Pen Inject 20 Units into the skin at bedtime. 9 pen 5  . Insulin Pen Needle (SURE COMFORT PEN NEEDLES) 31G X 5 MM MISC USE 1 PEN NEEDLE TO INJECT INSULIN 90 each 3  . loratadine (CLARITIN) 10 MG tablet Take 10 mg by mouth 2 (two) times daily.    Marland Kitchen lovastatin (MEVACOR) 40 MG tablet Take 2 tablets by mouth at bedtime 180 tablet 3  . methadone (DOLOPHINE) 10 MG tablet Take 2 tablets (20 mg total) by mouth every 6 (six) hours as needed. 240 tablet 0  . metoprolol tartrate (LOPRESSOR) 25 MG tablet Take 0.5 tablets (12.5 mg total) by mouth 2 (two) times daily. 90 tablet 3  . mometasone (ASMANEX, 60 METERED DOSES,) 220 MCG/INH inhaler Inhale 2 puffs into the lungs 2 (two) times daily. 1 Inhaler 5  . nitroGLYCERIN (NITROSTAT) 0.4 MG SL tablet Place 1 tablet (0.4 mg total) under the tongue every 5 (five) minutes as needed for chest pain. 100 tablet 1  . ondansetron (ZOFRAN ODT) 4 MG disintegrating tablet Take 1 tablet (4 mg total) by mouth every 8 (eight) hours as needed for nausea or vomiting. 20 tablet 0  . pantoprazole (PROTONIX) 40 MG tablet Take 1 tablet by mouth twice a day 180 tablet 3  . Tiotropium Bromide-Olodaterol (STIOLTO RESPIMAT) 2.5-2.5 MCG/ACT AERS Inhale 2 puffs into the lungs daily. 4 g 3  . torsemide (DEMADEX) 20 MG tablet Take 2 tablets (40 mg total) by mouth  daily. 180 tablet 3  . Treprostinil (TYVASO) 0.6 MG/ML SOLN Inhale 18 mcg into the lungs 4 (four) times daily.    Marland Kitchen  valACYclovir (VALTREX) 1000 MG tablet Take 1,000 mg by mouth daily as needed.  3  . ALPRAZolam (XANAX) 0.25 MG tablet Take 1 tablet (0.25 mg total) by mouth daily as needed for anxiety. (Patient not taking: Reported on 06/14/2019) 30 tablet 0   No current facility-administered medications for this visit.      PHYSICAL EXAMINATION: ECOG PERFORMANCE STATUS: 2 - Symptomatic, <50% confined to bed Vitals:   06/14/19 1037  BP: 133/79  Pulse: 89  Resp: 20  Temp: 97.6 F (36.4 C)  SpO2: (!) 88%   Filed Weights   06/14/19 1037  Weight: 158 lb 1.6 oz (71.7 kg)    Physical Exam Constitutional:      General: She is not in acute distress.    Appearance: She is ill-appearing.  HENT:     Head: Normocephalic and atraumatic.  Eyes:     General: No scleral icterus.    Pupils: Pupils are equal, round, and reactive to light.  Neck:     Musculoskeletal: Normal range of motion and neck supple.  Cardiovascular:     Rate and Rhythm: Normal rate and regular rhythm.     Heart sounds: Murmur present.  Pulmonary:     Effort: Pulmonary effort is normal. No respiratory distress.     Breath sounds: No wheezing.  Abdominal:     General: Bowel sounds are normal. There is no distension.     Palpations: Abdomen is soft. There is no mass.     Tenderness: There is no abdominal tenderness.  Musculoskeletal: Normal range of motion.        General: No deformity.  Skin:    General: Skin is warm and dry.     Findings: No erythema or rash.  Neurological:     Mental Status: She is alert and oriented to person, place, and time.     Cranial Nerves: No cranial nerve deficit.     Coordination: Coordination normal.  Psychiatric:        Behavior: Behavior normal.        Thought Content: Thought content normal.      LABORATORY DATA:  I have reviewed the data as listed Lab Results   Component Value Date   WBC 8.7 06/14/2019   HGB 15.2 (H) 06/14/2019   HCT 46.6 (H) 06/14/2019   MCV 92.5 06/14/2019   PLT 190 06/14/2019   Recent Labs    07/28/18 1054  NA 139  K 3.7  CL 94*  CO2 32  GLUCOSE 181*  BUN 27*  CREATININE 1.76*  CALCIUM 10.0  GFRNONAA 29*  GFRAA 34*  PROT 7.6  ALBUMIN 4.1  AST 25  ALT 14  ALKPHOS 61  BILITOT 0.6   Iron/TIBC/Ferritin/ %Sat    Component Value Date/Time   IRON 135 07/28/2018 1054   TIBC 310 07/28/2018 1054   FERRITIN 78 07/28/2018 1054   IRONPCTSAT 44 (H) 07/28/2018 1054     RADIOGRAPHIC STUDIES: I have personally reviewed the radiological images as listed and agreed with the findings in the report. CT chest lung cancer screening 04/13/2018  1. Lung-RADS 2, benign appearance or behavior. Continue annual screening with low-dose chest CT without contrast in 12 months. 2. Age advanced coronary artery atherosclerosis. Recommend assessment of coronary risk factors and consideration of medical Therapy. 3. Aortic atherosclerosis (ICD10-I70.0) and emphysema (ICD10-J43.9). 4. Aortic valvular calcifications. Consider echocardiography to evaluate for valvular dysfunction. 5. Pulmonary artery enlargement suggests pulmonary arterial hypertension.  ASSESSMENT & PLAN:  1. Erythrocytosis   Labs  are reviewed and discussed with patient. Hemoglobin 15.  2, hematocrit 46.6. Discussed with patient that her labs has been stable for the past 6 months.  Erythrocytosis is secondary to chronic respiratory failure.  And this is a compensating process. I would hold phlebotomy unless hematocrit reaches 50. For now continue observation. Continue follow-up with primary care physician and other specialists for her other chronic medical problems. Follow-up in 6 months for MD and lab assessment. Orders Placed This Encounter  Procedures  . CBC with Differential/Platelet    Standing Status:   Future    Standing Expiration Date:   06/13/2020    All  questions were answered. The patient knows to call the clinic with any problems questions or concerns. We spent sufficient time to discuss many aspect of care, questions were answered to patient's satisfaction. Total face to face encounter time for this patient visit was 15 min. >50% of the time was  spent in counseling and coordination of care.   Earlie Server, MD, PhD Hematology Oncology [facility] Pager- [phone] 06/14/2019

## 2019-06-17 ENCOUNTER — Ambulatory Visit: Payer: PPO | Admitting: Primary Care

## 2019-06-17 ENCOUNTER — Encounter: Payer: Self-pay | Admitting: Primary Care

## 2019-06-17 ENCOUNTER — Other Ambulatory Visit: Payer: Self-pay

## 2019-06-17 VITALS — BP 118/70 | HR 75 | Temp 97.9°F | Wt 157.0 lb

## 2019-06-17 DIAGNOSIS — I5032 Chronic diastolic (congestive) heart failure: Secondary | ICD-10-CM | POA: Diagnosis not present

## 2019-06-17 DIAGNOSIS — I272 Pulmonary hypertension, unspecified: Secondary | ICD-10-CM | POA: Diagnosis not present

## 2019-06-17 DIAGNOSIS — Z23 Encounter for immunization: Secondary | ICD-10-CM

## 2019-06-17 DIAGNOSIS — J9611 Chronic respiratory failure with hypoxia: Secondary | ICD-10-CM

## 2019-06-17 DIAGNOSIS — R0602 Shortness of breath: Secondary | ICD-10-CM | POA: Diagnosis not present

## 2019-06-17 LAB — BASIC METABOLIC PANEL
BUN: 28 mg/dL — ABNORMAL HIGH (ref 6–23)
CO2: 38 mEq/L — ABNORMAL HIGH (ref 19–32)
Calcium: 9.9 mg/dL (ref 8.4–10.5)
Chloride: 93 mEq/L — ABNORMAL LOW (ref 96–112)
Creatinine, Ser: 2.21 mg/dL — ABNORMAL HIGH (ref 0.40–1.20)
GFR: 22.25 mL/min — ABNORMAL LOW (ref >60.00)
Glucose, Bld: 149 mg/dL — ABNORMAL HIGH (ref 70–99)
Potassium: 3.9 mEq/L (ref 3.5–5.1)
Sodium: 139 mEq/L (ref 135–145)

## 2019-06-17 LAB — BRAIN NATRIURETIC PEPTIDE: Pro B Natriuretic peptide (BNP): 962 pg/mL — ABNORMAL HIGH (ref 0.0–100.0)

## 2019-06-17 MED ORDER — ASMANEX (120 METERED DOSES) 220 MCG/INH IN AEPB: 2.0000 | INHALATION_SPRAY | Freq: Two times a day (BID) | RESPIRATORY_TRACT | 6 refills | Status: DC

## 2019-06-17 NOTE — Progress Notes (Signed)
_0  ID: Lindsay Harmon, female    DOB: 01-09-1956, 63 y.o.   MRN: [id number]  Chief Complaint  Patient presents with  . Shortness of Breath    Does not have a definite pattern, but when it happens the rescue inhaler does help.    Referring provider: Venia Carbon, MD  Synopsis: Referred by cardiology in 2016 for evaluation of severe COPD and possible obstructive sleep apnea in the setting of pulmonary hypertension. She also has congestive heart failure. A sleep study in 2016 showed no evidence of obstructive sleep apnea but she does have significant nocturnal hypoxemia. Spirometry testing in 2016 confirmed a diagnosis of COPD with severe airflow obstruction FEV1 41% pred.  She smoked cigarettes for many years and she quit smoking in 2013 after smoking 2 ppd for 40 years. In 2019 she took sildenafil and her oxygenation worsened while hospitalized.  We ultimately started Tyvaso.  History of Present Illness: 63 year old female, former smoker quit in 2006 (50 pack year hx). PMH significant for COPD, pulmonary hypertension. Patient of Dr. Lake Bells. Maintained on Stiolto and Tyvaso. She is unable to go any higher than 7 puffs QID on Tyvaso because this causes increased airway symptoms such as wheezing. Per Dr. Anastasia Pall note he would like to maximize COPD treatment to see if this makes any difference in dyspnea symptoms. Started on Pulmicort 2 puffs daily, continues Stiolto twice daily and prn albuterol. If this does not improve will need to check echocardiogram and consider ramping up her pulmonary hypertension treatment by starting ambrisentan 32m daily.    06/17/2019 Patient presents today for 3 month follow-up. Reports that since changing from Pulmicort to Asmanex she has been experiencing some increased shortness of breath at random times. Most in the evening or early morning. Denies chest tightness or wheezing. She is currently only taking Asmanex 2 puffs once in the morning, continues  Stiolto once daily. Maintained 7 puffs Tyvaso. Weight is up approx x3 lbs. On torsdemide 479mdaily. Otherwise doing well.   Allergies  Allergen Reactions  . Sertraline Hcl Other (See Comments)    Cloudy thoughts, malaise    Immunization History  Administered Date(s) Administered  . Influenza Split 08/13/2011  . Influenza Whole 09/16/2007, 08/17/2009, 07/31/2010  . Influenza,inj,Quad PF,6+ Mos 07/12/2013, 08/09/2014, 08/10/2015, 07/23/2016, 07/23/2017, 06/19/2018  . Pneumococcal Conjugate-13 11/08/2014  . Pneumococcal Polysaccharide-23 08/05/2002, 02/27/2016  . Td 05/19/2006  . Tdap 04/26/2017  . Zoster 11/23/2014    Past Medical History:  Diagnosis Date  . Allergy   . Aortic atherosclerosis ([place]  . Arthritis   . CKD (chronic kidney disease), stage IV ([place]  . COPD (chronic obstructive pulmonary disease) ([place]   a. 11/2014 PFT's: FEV1 0.87 (38%), FVC 1.41 (48%), FEF 25-75 0.41 (19%). Unable to perform DLCO; b. 12/2017 Simple spirometry ration 67%. FEV1 1.21 L+53% predicted. FVC 1.79L 61% predicted.  . Coronary artery calcification seen on CT scan    a. 03/2017 CT Chest.  . Depression   . Diabetic neuropathy ([place]  . Esophageal stricture   . Familial hematuria   . GERD (gastroesophageal reflux disease)   . Hyperlipidemia   . Hypertension   . IBS (irritable bowel syndrome)   . Nephrolithiasis   . NIDDM (non-insulin dependent diabetes mellitus)   . Obesity, unspecified   . Pulmonary hypertension ([place]   a. WHO Group 3; b. 10/2016 Echo: EF 55-60%, no rwma, Gr1 DD, mild to mod AS, mean grad 1653m, mildly dil  RV w/ mildly reduced RV fxn, mildly dil RA. PASP 179mHg; c. 12/2017 RHC: RA 11, RV 92/12, PA 92/33(57), PCWP 15, Fick CO/CI 5.9/3.3. PVR 7.2 WU. Ao 93%, PA 62/64%.  .Marland KitchenPVD (peripheral vascular disease) ([place]     Tobacco History: Social History   Tobacco Use  Smoking Status Former Smoker  . Packs/day: 1.50  . Years: 33.00  . Pack years: 49.50  . Types:  Cigarettes  . Quit date: 07/28/2005  . Years since quitting: 13.8  Smokeless Tobacco Never Used   Counseling given: Not Answered   Outpatient Medications Prior to Visit  Medication Sig Dispense Refill  . albuterol (PROVENTIL) (2.5 MG/3ML) 0.083% nebulizer solution Take 3 mLs (2.5 mg total) by nebulization every 6 (six) hours as needed for wheezing or shortness of breath. 360 mL 5  . albuterol (VENTOLIN HFA) 108 (90 Base) MCG/ACT inhaler USE 2 PUFFS EVERY SIX HOURS AS NEEDED FOR WHEEZING OR SHORTNESS OF BREATH 18 g 5  . ALPRAZolam (XANAX) 0.25 MG tablet Take 1 tablet (0.25 mg total) by mouth daily as needed for anxiety. 30 tablet 0  . aspirin EC 81 MG tablet Take 1 tablet (81 mg total) by mouth daily. 90 tablet 3  . calcitRIOL (ROCALTROL) 0.5 MCG capsule Take 0.5 mcg by mouth daily.     . cholecalciferol (VITAMIN D) 1000 units tablet Take 1,000 Units by mouth at bedtime.     . ferrous sulfate 325 (65 FE) MG tablet Take 325 mg by mouth daily with breakfast.    . gabapentin (NEURONTIN) 300 MG capsule Take 1 capsule in the am and 2 at bedtime. 90 capsule 11  . glucose blood (ONE TOUCH ULTRA TEST) test strip USE 1 STRIP TO TEST BLOOD SUGAR ONCE DAILY Dx Code E11.51 100 each 3  . hydrocortisone 2.5 % cream Apply topically 3 (three) times daily as needed. 28 g 3  . Insulin Glargine (LANTUS SOLOSTAR) 100 UNIT/ML Solostar Pen Inject 20 Units into the skin at bedtime. 9 pen 5  . Insulin Pen Needle (SURE COMFORT PEN NEEDLES) 31G X 5 MM MISC USE 1 PEN NEEDLE TO INJECT INSULIN 90 each 3  . loratadine (CLARITIN) 10 MG tablet Take 10 mg by mouth 2 (two) times daily.    .Marland Kitchenlovastatin (MEVACOR) 40 MG tablet Take 2 tablets by mouth at bedtime 180 tablet 3  . methadone (DOLOPHINE) 10 MG tablet Take 2 tablets (20 mg total) by mouth every 6 (six) hours as needed. 240 tablet 0  . metoprolol tartrate (LOPRESSOR) 25 MG tablet Take 0.5 tablets (12.5 mg total) by mouth 2 (two) times daily. 90 tablet 3  . nitroGLYCERIN  (NITROSTAT) 0.4 MG SL tablet Place 1 tablet (0.4 mg total) under the tongue every 5 (five) minutes as needed for chest pain. 100 tablet 1  . ondansetron (ZOFRAN ODT) 4 MG disintegrating tablet Take 1 tablet (4 mg total) by mouth every 8 (eight) hours as needed for nausea or vomiting. 20 tablet 0  . pantoprazole (PROTONIX) 40 MG tablet Take 1 tablet by mouth twice a day 180 tablet 3  . Tiotropium Bromide-Olodaterol (STIOLTO RESPIMAT) 2.5-2.5 MCG/ACT AERS Inhale 2 puffs into the lungs daily. 4 g 3  . Treprostinil (TYVASO) 0.6 MG/ML SOLN Inhale 18 mcg into the lungs 4 (four) times daily.    . valACYclovir (VALTREX) 1000 MG tablet Take 1,000 mg by mouth daily as needed.  3  . mometasone (ASMANEX, 60 METERED DOSES,) 220 MCG/INH inhaler Inhale 2 puffs into the lungs  2 (two) times daily. 1 Inhaler 5  . torsemide (DEMADEX) 20 MG tablet Take 2 tablets (40 mg total) by mouth daily. 180 tablet 3   No facility-administered medications prior to visit.    Review of Systems  Review of Systems  Constitutional: Negative.   Respiratory: Positive for shortness of breath. Negative for cough and wheezing.   Cardiovascular: Negative.    Physical Exam  BP 118/70 (BP Location: Right Arm, Patient Position: Sitting, Cuff Size: Normal)   Pulse 75   Temp 97.9 F (36.6 C)   Wt 157 lb (71.2 kg)   SpO2 93% Comment: on 5L pulse  BMI 29.18 kg/m  Physical Exam Constitutional:      General: She is not in acute distress.    Appearance: She is well-developed. She is not ill-appearing.  HENT:     Head: Normocephalic and atraumatic.  Cardiovascular:     Rate and Rhythm: Normal rate.     Heart sounds: Murmur present.  Pulmonary:     Effort: Pulmonary effort is normal.     Breath sounds: Normal breath sounds.     Comments: CTA; on POC  Musculoskeletal: Normal range of motion.     Comments: Amb with walker  Skin:    General: Skin is warm and dry.  Neurological:     General: No focal deficit present.     Mental  Status: She is alert and oriented to person, place, and time.  Psychiatric:        Mood and Affect: Mood normal.        Behavior: Behavior normal.      Lab Results:  CBC    Component Value Date/Time   WBC 8.7 06/14/2019 1011   RBC 5.04 06/14/2019 1011   HGB 15.2 (H) 06/14/2019 1011   HCT 46.6 (H) 06/14/2019 1011   PLT 190 06/14/2019 1011   MCV 92.5 06/14/2019 1011   MCH 30.2 06/14/2019 1011   MCHC 32.6 06/14/2019 1011   RDW 13.6 06/14/2019 1011   LYMPHSABS 1.4 06/14/2019 1011   MONOABS 0.4 06/14/2019 1011   EOSABS 0.2 06/14/2019 1011   BASOSABS 0.1 06/14/2019 1011    BMET    Component Value Date/Time   NA 139 07/28/2018 1054   K 3.7 07/28/2018 1054   CL 94 (L) 07/28/2018 1054   CO2 32 07/28/2018 1054   GLUCOSE 181 (H) 07/28/2018 1054   BUN 27 (H) 07/28/2018 1054   CREATININE 1.76 (H) 07/28/2018 1054   CALCIUM 10.0 07/28/2018 1054   GFRNONAA 29 (L) 07/28/2018 1054   GFRAA 34 (L) 07/28/2018 1054    BNP    Component Value Date/Time   BNP 1,679.0 (H) 04/19/2018 2215    ProBNP No results found for: PROBNP  Imaging: No results found.   Assessment & Plan:   Pulmonary hypertension (HCC) - Breathing overall stable - Continue 7 puffs Tyvaso QID - Plan to maximize COPD treatment, if sob not improved recommend rechecking echocardiogram and cosider ramping up her pulmonary hypertension treatment by starting ambrisentan 13m daily  COPD (chronic obstructive pulmonary disease) ([place] - Patient reports some evening/early morning shortness of breath. Denies wheezing or chest tightness - She was not using her ICS twice daily - RX sent for Asmanex 200- two puffs twice daily  (120 metered dose)  - Continue Stiolto two puffs daily  - Continue Albuterol hfa/neb every 6 hours as needed for shortness of breath/wheezing  Chronic respiratory failure with hypoxia (HCC) - Oxygen 4-5 L continuously to keep  O2 >88-90% - Use portable oxygen when out of the house   Chronic  diastolic heart failure (HCC) - Reports 3 lbs weight gain  - Checking BNP and BMET - Continues torsemide 23m daily  - Follows with cardiology    EMartyn Ehrich NP 06/17/2019

## 2019-06-17 NOTE — Patient Instructions (Addendum)
Rx: Sent in prescription for Asmanex (120 meter) - take 2 puffs twice daily   Orders: Labs today   PAH - Continue to monitor weight - Continue 7 puffs Tyvaso QID - If breathing worsens or you are requiring rescue inhaler more frequently notify office   COPD - Continue Stiolto 2 puffs daily - Continue Asmanex 2 puffs twice daily  - Continue Albuterol hfa/neb every 6 hours as needed for shortness of breath/wheezing  Chronic respiratory failure with hypoxia - Continue home oxygen 4-5 L to keep O2 >88-90% - Use portable oxygen when out of the house   Follow up: - 3 months with new pulmonary provider (Dr. Lamonte Sakai or Carlis Abbott) or sooner if shortness of breath worsens  - Follow up with cardiology

## 2019-06-17 NOTE — Assessment & Plan Note (Addendum)
-  Breathing overall stable - Continue 7 puffs Tyvaso QID - Plan to maximize COPD treatment, if sob not improved recommend rechecking echocardiogram and cosider ramping up her pulmonary hypertension treatment by starting ambrisentan 39m daily

## 2019-06-17 NOTE — Assessment & Plan Note (Signed)
-  Oxygen 4-5 L continuously to keep O2 >88-90% - Use portable oxygen when out of the house

## 2019-06-17 NOTE — Assessment & Plan Note (Addendum)
-  Reports 3 lbs weight gain  - Checking BNP and BMET - Continues torsemide 52m daily  - Follows with cardiology

## 2019-06-17 NOTE — Assessment & Plan Note (Addendum)
-  Patient reports some evening/early morning shortness of breath. Denies wheezing or chest tightness - She was not using her ICS twice daily - RX sent for Asmanex 200- two puffs twice daily  (120 metered dose)  - Continue Stiolto two puffs daily  - Continue Albuterol hfa/neb every 6 hours as needed for shortness of breath/wheezing - Receive high does influenza vaccine today in office

## 2019-06-17 NOTE — Progress Notes (Signed)
Reviewed, agree 

## 2019-06-18 ENCOUNTER — Telehealth: Payer: Self-pay | Admitting: Primary Care

## 2019-06-18 NOTE — Telephone Encounter (Signed)
Spoke with pharmacists and advised him that we could change the Rx to 90 day supply for mail order pharmacy. He understood and nothing further is needed.

## 2019-06-18 NOTE — Progress Notes (Signed)
Please let patient know her BNP was elevated at 962. I recommend she call her cardiologist for torsemide direction

## 2019-06-25 ENCOUNTER — Other Ambulatory Visit: Payer: Self-pay

## 2019-06-25 ENCOUNTER — Encounter: Payer: Self-pay | Admitting: Internal Medicine

## 2019-06-25 ENCOUNTER — Ambulatory Visit (INDEPENDENT_AMBULATORY_CARE_PROVIDER_SITE_OTHER): Payer: PPO | Admitting: Internal Medicine

## 2019-06-25 DIAGNOSIS — F1120 Opioid dependence, uncomplicated: Secondary | ICD-10-CM | POA: Diagnosis not present

## 2019-06-25 DIAGNOSIS — IMO0002 Reserved for concepts with insufficient information to code with codable children: Secondary | ICD-10-CM

## 2019-06-25 DIAGNOSIS — E1151 Type 2 diabetes mellitus with diabetic peripheral angiopathy without gangrene: Secondary | ICD-10-CM

## 2019-06-25 DIAGNOSIS — G8929 Other chronic pain: Secondary | ICD-10-CM

## 2019-06-25 DIAGNOSIS — E1165 Type 2 diabetes mellitus with hyperglycemia: Secondary | ICD-10-CM | POA: Diagnosis not present

## 2019-06-25 DIAGNOSIS — M545 Low back pain: Secondary | ICD-10-CM

## 2019-06-25 NOTE — Assessment & Plan Note (Signed)
Reasonable control with her medication No changes

## 2019-06-25 NOTE — Assessment & Plan Note (Signed)
Not quite as good control--but likely acceptable under circumstances Will recheck at next visit

## 2019-06-25 NOTE — Progress Notes (Signed)
Subjective:    Patient ID: Lindsay Harmon, female    DOB: 07-03-1956, 63 y.o.   MRN: [id number]  HPI Here for review of chronic pain and narcotic dependence  Generally okay She is concerned about her sugars Mostly up in 150's to 170's----had been 130-150 generally Nothing different and watching her diet Burning neuropathy is some worse--still on gabapentin 300/600  Back pain is about the same Does have some better days--will only take 2-3 of the methadone (rarely just 2) Still mostly limited by her breathing--but trouble vacuuming, etc  Breathing is about the same Did have pulmonary visit recently-no changes  Current Outpatient Medications on File Prior to Visit  Medication Sig Dispense Refill  . albuterol (PROVENTIL) (2.5 MG/3ML) 0.083% nebulizer solution Take 3 mLs (2.5 mg total) by nebulization every 6 (six) hours as needed for wheezing or shortness of breath. 360 mL 5  . albuterol (VENTOLIN HFA) 108 (90 Base) MCG/ACT inhaler USE 2 PUFFS EVERY SIX HOURS AS NEEDED FOR WHEEZING OR SHORTNESS OF BREATH 18 g 5  . ALPRAZolam (XANAX) 0.25 MG tablet Take 1 tablet (0.25 mg total) by mouth daily as needed for anxiety. 30 tablet 0  . aspirin EC 81 MG tablet Take 1 tablet (81 mg total) by mouth daily. 90 tablet 3  . calcitRIOL (ROCALTROL) 0.5 MCG capsule Take 0.5 mcg by mouth daily.     . cholecalciferol (VITAMIN D) 1000 units tablet Take 1,000 Units by mouth at bedtime.     . ferrous sulfate 325 (65 FE) MG tablet Take 325 mg by mouth daily with breakfast.    . gabapentin (NEURONTIN) 300 MG capsule Take 1 capsule in the am and 2 at bedtime. 90 capsule 11  . glucose blood (ONE TOUCH ULTRA TEST) test strip USE 1 STRIP TO TEST BLOOD SUGAR ONCE DAILY Dx Code E11.51 100 each 3  . hydrocortisone 2.5 % cream Apply topically 3 (three) times daily as needed. 28 g 3  . Insulin Glargine (LANTUS SOLOSTAR) 100 UNIT/ML Solostar Pen Inject 20 Units into the skin at bedtime. 9 pen 5  . Insulin Pen Needle  (SURE COMFORT PEN NEEDLES) 31G X 5 MM MISC USE 1 PEN NEEDLE TO INJECT INSULIN 90 each 3  . loratadine (CLARITIN) 10 MG tablet Take 10 mg by mouth 2 (two) times daily.    Marland Kitchen lovastatin (MEVACOR) 40 MG tablet Take 2 tablets by mouth at bedtime 180 tablet 3  . methadone (DOLOPHINE) 10 MG tablet Take 2 tablets (20 mg total) by mouth every 6 (six) hours as needed. 240 tablet 0  . metoprolol tartrate (LOPRESSOR) 25 MG tablet Take 0.5 tablets (12.5 mg total) by mouth 2 (two) times daily. 90 tablet 3  . mometasone (ASMANEX, 120 METERED DOSES,) 220 MCG/INH inhaler Inhale 2 puffs into the lungs 2 (two) times daily. 1 Inhaler 6  . nitroGLYCERIN (NITROSTAT) 0.4 MG SL tablet Place 1 tablet (0.4 mg total) under the tongue every 5 (five) minutes as needed for chest pain. 100 tablet 1  . ondansetron (ZOFRAN ODT) 4 MG disintegrating tablet Take 1 tablet (4 mg total) by mouth every 8 (eight) hours as needed for nausea or vomiting. 20 tablet 0  . pantoprazole (PROTONIX) 40 MG tablet Take 1 tablet by mouth twice a day 180 tablet 3  . Tiotropium Bromide-Olodaterol (STIOLTO RESPIMAT) 2.5-2.5 MCG/ACT AERS Inhale 2 puffs into the lungs daily. 4 g 3  . Treprostinil (TYVASO) 0.6 MG/ML SOLN Inhale 18 mcg into the lungs 4 (four) times  daily.    . valACYclovir (VALTREX) 1000 MG tablet Take 1,000 mg by mouth daily as needed.  3  . torsemide (DEMADEX) 20 MG tablet Take 2 tablets (40 mg total) by mouth daily. 180 tablet 3   No current facility-administered medications on file prior to visit.     Allergies  Allergen Reactions  . Sertraline Hcl Other (See Comments)    Cloudy thoughts, malaise    Past Medical History:  Diagnosis Date  . Allergy   . Aortic atherosclerosis ([place])   . Arthritis   . CKD (chronic kidney disease), stage IV ([place])   . COPD (chronic obstructive pulmonary disease) ([place])    a. 11/2014 PFT's: FEV1 0.87 (38%), FVC 1.41 (48%), FEF 25-75 0.41 (19%). Unable to perform DLCO; b. 12/2017 Simple spirometry  ration 67%. FEV1 1.21 L+53% predicted. FVC 1.79L 61% predicted.  . Coronary artery calcification seen on CT scan    a. 03/2017 CT Chest.  . Depression   . Diabetic neuropathy ([place])   . Esophageal stricture   . Familial hematuria   . GERD (gastroesophageal reflux disease)   . Hyperlipidemia   . Hypertension   . IBS (irritable bowel syndrome)   . Nephrolithiasis   . NIDDM (non-insulin dependent diabetes mellitus)   . Obesity, unspecified   . Pulmonary hypertension ([place])    a. WHO Group 3; b. 10/2016 Echo: EF 55-60%, no rwma, Gr1 DD, mild to mod AS, mean grad 49mHg, mildly dil RV w/ mildly reduced RV fxn, mildly dil RA. PASP 1149mg; c. 12/2017 RHC: RA 11, RV 92/12, PA 92/33(57), PCWP 15, Fick CO/CI 5.9/3.3. PVR 7.2 WU. Ao 93%, PA 62/64%.  . Marland KitchenVD (peripheral vascular disease) ([place]    Past Surgical History:  Procedure Laterality Date  . ABDOMINAL HYSTERECTOMY    . CARPAL TUNNEL RELEASE    . CESAREAN SECTION    . ERCP N/A 03/17/2017   Procedure: ENDOSCOPIC RETROGRADE CHOLANGIOPANCREATOGRAPHY (ERCP);  Surgeon: WoLucilla LameMD;  [facility];  Service: Endoscopy;  Laterality: N/A;  . EUS N/A 03/13/2017   Procedure: FULL UPPER ENDOSCOPIC ULTRASOUND (EUS) RADIAL;  Surgeon: Burbridge, ReMurray HodgkinsMD;  Location: [facility]COPY;  Service: Endoscopy;  Laterality: N/A;  . RIGHT HEART CATH N/A 01/22/2018   Procedure: RIGHT HEART CATH;  Surgeon: BeJolaine ArtistMD;  [facility];  Service: Cardiovascular;  Laterality: N/A;  . RIGHT HEART CATHETERIZATION N/A 12/05/2014   Procedure: RIGHT HEART CATH;  Surgeon: HeSinclair GroomsMD;  [facility];  Service: Cardiovascular;  Laterality: N/A;    Family History  Problem Relation Age of Onset  . Diabetes Mother   . Cancer Mother        ovarian,melanoma  . Allergies Father   . Cancer Father        lung  . Cancer Maternal Grandmother        uterine  . Emphysema Paternal Grandfather   . Allergies Sister   .  Breast cancer Neg Hx     Social History   Socioeconomic History  . Marital status: Legally Separated    Spouse name: Not on file  . Number of children: 1  . Years of education: Not on file  . Highest education level: Not on file  Occupational History  . 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 Financial resource strain: Not on file  . Food insecurity    Worry: Not on  file    Inability: Not on file  . Transportation needs    Medical: Not on file    Non-medical: Not on file  Tobacco Use  . Smoking status: Former Smoker    Packs/day: 1.50    Years: 33.00    Pack years: 49.50    Types: Cigarettes    Quit date: 07/28/2005    Years since quitting: 13.9  . Smokeless tobacco: Never Used  Substance and Sexual Activity  . Alcohol use: No    Alcohol/week: 0.0 standard drinks    Comment: heavy in the past  . Drug use: No  . Sexual activity: Never  Lifestyle  . Physical activity    Days per week: Not on file    Minutes per session: Not on file  . Stress: Not on file  Relationships  . Social Herbalist on phone: Not on file    Gets together: Not on file    Attends religious service: Not on file    Active member of club or organization: Not on file    Attends meetings of clubs or organizations: Not on file    Relationship status: Not on file  . Intimate partner violence    Fear of current or ex partner: Not on file    Emotionally abused: Not on file    Physically abused: Not on file    Forced sexual activity: Not on file  Other Topics Concern  . Not on file  Social History Narrative   No living will   Son [name] (then mom) should make decisions for her if she is unable.    Would accept resuscitation attempts but no prolonged ventilation   Not sure about tube feeds but probably wouldn't want them if cognitively unaware   Review of Systems  Weight is about the same--she checks daily Sleeps pretty well      Objective:   Physical Exam  Constitutional: No distress.  Neck: No thyromegaly present.  Cardiovascular: Normal rate and regular rhythm. Exam reveals no gallop.  Coarse systolic murmur and soft diastolic murmur  Respiratory: Effort normal. No respiratory distress. She has no wheezes. She has no rales.  Decreased breath sounds but clear  Musculoskeletal:        General: No edema.  Lymphadenopathy:    She has no cervical adenopathy.           Assessment & Plan:

## 2019-06-25 NOTE — Assessment & Plan Note (Signed)
PDMP reviewed Will update contract at next visit

## 2019-07-04 DIAGNOSIS — J439 Emphysema, unspecified: Secondary | ICD-10-CM | POA: Diagnosis not present

## 2019-07-06 ENCOUNTER — Other Ambulatory Visit: Payer: Self-pay | Admitting: *Deleted

## 2019-07-06 MED ORDER — METHADONE HCL 10 MG PO TABS: 20.0000 mg | ORAL_TABLET | Freq: Four times a day (QID) | ORAL | 0 refills | Status: DC | PRN

## 2019-07-06 MED ORDER — NITROGLYCERIN 0.4 MG SL SUBL: 0.4000 mg | SUBLINGUAL_TABLET | SUBLINGUAL | 1 refills | Status: DC | PRN

## 2019-07-06 NOTE — Telephone Encounter (Signed)
Patient left a voicemail requesting a refill on nitroglycerin and Methadone NTG last refill 10/23/18 #100/1  Name of Medication: Methadone Name of Pharmacy: [facility] or Written Date and Quantity: 06/07/19 #240 Last Office Visit and Type: 06/25/19 pain management Next Office Visit and Type: 09/27/19 Last Controlled Substance Agreement Date: 07/14/18 Last UDS:07/14/18

## 2019-07-08 DIAGNOSIS — N2581 Secondary hyperparathyroidism of renal origin: Secondary | ICD-10-CM | POA: Diagnosis not present

## 2019-07-08 DIAGNOSIS — E1122 Type 2 diabetes mellitus with diabetic chronic kidney disease: Secondary | ICD-10-CM | POA: Diagnosis not present

## 2019-07-08 DIAGNOSIS — R600 Localized edema: Secondary | ICD-10-CM | POA: Diagnosis not present

## 2019-07-08 DIAGNOSIS — R809 Proteinuria, unspecified: Secondary | ICD-10-CM | POA: Diagnosis not present

## 2019-07-08 DIAGNOSIS — N184 Chronic kidney disease, stage 4 (severe): Secondary | ICD-10-CM | POA: Diagnosis not present

## 2019-07-12 NOTE — Telephone Encounter (Signed)
Pt left v/m that ntg was not at [facility]. I spoke with Brie at [facility] and she does have ntg refill on hold and will get ready for pick up. Pt notified and voiced understanding.

## 2019-08-03 DIAGNOSIS — J439 Emphysema, unspecified: Secondary | ICD-10-CM | POA: Diagnosis not present

## 2019-08-24 DIAGNOSIS — E119 Type 2 diabetes mellitus without complications: Secondary | ICD-10-CM | POA: Diagnosis not present

## 2019-08-24 LAB — HM DIABETES EYE EXAM

## 2019-08-25 ENCOUNTER — Other Ambulatory Visit: Payer: Self-pay

## 2019-08-25 MED ORDER — METHADONE HCL 10 MG PO TABS: 20.0000 mg | ORAL_TABLET | Freq: Four times a day (QID) | ORAL | 0 refills | Status: DC | PRN

## 2019-08-25 NOTE — Telephone Encounter (Signed)
Name of Medication: Methadone 10 mg Name of Pharmacy: [facility] or Written Date and Quantity:# 240 on 07/06/19  Last Office Visit and Type:06/25/19 for 76mh  pain mgt Next Office Visit and Type:09/27/19 for 3 mth FU  Last Controlled Substance Agreement Date: 07/17/2018 Last UDS:07/14/2018  In v.m pt said for several days her FBS around 200 and pt wants to know if any adjustment needs to be made to insulin.pt is taking lantus  20 units in the morning after cking BS. Pt wants to know if should continue taking in AM or take at hs. Pt not having any symptoms; diet has not changed and due to back pain pt is not exercising.pts routine has not changed. Pt request cb.

## 2019-09-03 DIAGNOSIS — J439 Emphysema, unspecified: Secondary | ICD-10-CM | POA: Diagnosis not present

## 2019-09-07 ENCOUNTER — Encounter: Payer: Self-pay | Admitting: Internal Medicine

## 2019-09-10 ENCOUNTER — Other Ambulatory Visit: Payer: Self-pay | Admitting: Primary Care

## 2019-09-16 ENCOUNTER — Other Ambulatory Visit: Payer: Self-pay

## 2019-09-16 MED ORDER — LOVASTATIN 40 MG PO TABS: 80.0000 mg | ORAL_TABLET | Freq: Every day | ORAL | 3 refills | Status: DC

## 2019-09-16 MED ORDER — PANTOPRAZOLE SODIUM 40 MG PO TBEC: 40.0000 mg | DELAYED_RELEASE_TABLET | Freq: Two times a day (BID) | ORAL | 3 refills | Status: DC

## 2019-09-16 NOTE — Telephone Encounter (Signed)
Rxs sent electronically.

## 2019-09-27 ENCOUNTER — Ambulatory Visit: Payer: PPO | Admitting: Internal Medicine

## 2019-09-27 ENCOUNTER — Encounter: Payer: Self-pay | Admitting: Internal Medicine

## 2019-09-27 ENCOUNTER — Ambulatory Visit (INDEPENDENT_AMBULATORY_CARE_PROVIDER_SITE_OTHER): Payer: PPO | Admitting: Internal Medicine

## 2019-09-27 ENCOUNTER — Other Ambulatory Visit: Payer: Self-pay

## 2019-09-27 VITALS — BP 138/86 | HR 97 | Temp 98.3°F | Ht 62.0 in | Wt 158.0 lb

## 2019-09-27 DIAGNOSIS — I25119 Atherosclerotic heart disease of native coronary artery with unspecified angina pectoris: Secondary | ICD-10-CM | POA: Diagnosis not present

## 2019-09-27 DIAGNOSIS — F1120 Opioid dependence, uncomplicated: Secondary | ICD-10-CM

## 2019-09-27 DIAGNOSIS — M549 Dorsalgia, unspecified: Secondary | ICD-10-CM | POA: Diagnosis not present

## 2019-09-27 DIAGNOSIS — G8929 Other chronic pain: Secondary | ICD-10-CM | POA: Diagnosis not present

## 2019-09-27 DIAGNOSIS — D692 Other nonthrombocytopenic purpura: Secondary | ICD-10-CM | POA: Diagnosis not present

## 2019-09-27 DIAGNOSIS — E1149 Type 2 diabetes mellitus with other diabetic neurological complication: Secondary | ICD-10-CM

## 2019-09-27 NOTE — Assessment & Plan Note (Signed)
Lives alone and manages Some restriction still On the methadone but tries to limit

## 2019-09-27 NOTE — Assessment & Plan Note (Signed)
Marked purpura especially on arms She feels this is worse since on tyvaso

## 2019-09-27 NOTE — Assessment & Plan Note (Signed)
PDMP report not available Will check with next refill UDS today

## 2019-09-27 NOTE — Assessment & Plan Note (Signed)
Rare chest pain DOE is stable

## 2019-09-27 NOTE — Progress Notes (Signed)
Subjective:    Patient ID: Lindsay Harmon, female    DOB: [birth year], 63 y.o.   MRN: [id number]  HPI Here for follow up of chronic pain and diabetes (as well as other chronic conditions)  Breathing is about the same Variable exercise tolerance----limited by both bad back pain and DOE No regular chest pain---only has needed nitro once (and it helped) Some edema--more than usual (she tries to keep them elevated) Weighs every morning--has been stable  Uses the methadone daily Trying to limit use---can get by with 2-3 per day often  Checks sugars daily 129 to low 200's---seems higher No hypoglycemic reactions More tingling lately in feet---pain at times (but not severe). Burning type hurt  Current Outpatient Medications on File Prior to Visit  Medication Sig Dispense Refill  . albuterol (PROVENTIL) (2.5 MG/3ML) 0.083% nebulizer solution Take 3 mLs (2.5 mg total) by nebulization every 6 (six) hours as needed for wheezing or shortness of breath. 360 mL 5  . albuterol (VENTOLIN HFA) 108 (90 Base) MCG/ACT inhaler USE 2 PUFFS EVERY SIX HOURS AS NEEDED FOR WHEEZING OR SHORTNESS OF BREATH 18 g 5  . ALPRAZolam (XANAX) 0.25 MG tablet Take 1 tablet (0.25 mg total) by mouth daily as needed for anxiety. 30 tablet 0  . aspirin EC 81 MG tablet Take 1 tablet (81 mg total) by mouth daily. 90 tablet 3  . calcitRIOL (ROCALTROL) 0.5 MCG capsule Take 0.5 mcg by mouth daily.     . cholecalciferol (VITAMIN D) 1000 units tablet Take 1,000 Units by mouth at bedtime.     . ferrous sulfate 325 (65 FE) MG tablet Take 325 mg by mouth daily with breakfast.    . gabapentin (NEURONTIN) 300 MG capsule Take 1 capsule in the am and 2 at bedtime. 90 capsule 11  . glucose blood (ONE TOUCH ULTRA TEST) test strip USE 1 STRIP TO TEST BLOOD SUGAR ONCE DAILY Dx Code E11.51 100 each 3  . hydrocortisone 2.5 % cream Apply topically 3 (three) times daily as needed. 28 g 3  . Insulin Glargine (LANTUS SOLOSTAR) 100 UNIT/ML Solostar  Pen Inject 20 Units into the skin at bedtime. 9 pen 5  . Insulin Pen Needle (SURE COMFORT PEN NEEDLES) 31G X 5 MM MISC USE 1 PEN NEEDLE TO INJECT INSULIN 90 each 3  . loratadine (CLARITIN) 10 MG tablet Take 10 mg by mouth 2 (two) times daily.    Marland Kitchen lovastatin (MEVACOR) 40 MG tablet Take 2 tablets (80 mg total) by mouth at bedtime. 180 tablet 3  . methadone (DOLOPHINE) 10 MG tablet Take 2 tablets (20 mg total) by mouth every 6 (six) hours as needed. 240 tablet 0  . metoprolol tartrate (LOPRESSOR) 25 MG tablet Take 0.5 tablets (12.5 mg total) by mouth 2 (two) times daily. 90 tablet 3  . mometasone (ASMANEX, 120 METERED DOSES,) 220 MCG/INH inhaler Inhale 2 puffs into the lungs 2 (two) times daily. 1 Inhaler 6  . nitroGLYCERIN (NITROSTAT) 0.4 MG SL tablet Place 1 tablet (0.4 mg total) under the tongue every 5 (five) minutes as needed for chest pain. 100 tablet 1  . ondansetron (ZOFRAN ODT) 4 MG disintegrating tablet Take 1 tablet (4 mg total) by mouth every 8 (eight) hours as needed for nausea or vomiting. 20 tablet 0  . pantoprazole (PROTONIX) 40 MG tablet Take 1 tablet (40 mg total) by mouth 2 (two) times daily. 180 tablet 3  . STIOLTO RESPIMAT 2.5-2.5 MCG/ACT AERS INHALE 2 PUFFS BY MOUTH INTO  THE LUNGS DAILY. 4 g 3  . Treprostinil (TYVASO) 0.6 MG/ML SOLN Inhale 18 mcg into the lungs 4 (four) times daily.    . valACYclovir (VALTREX) 1000 MG tablet Take 1,000 mg by mouth daily as needed.  3  . torsemide (DEMADEX) 20 MG tablet Take 2 tablets (40 mg total) by mouth daily. 180 tablet 3   No current facility-administered medications on file prior to visit.     Allergies  Allergen Reactions  . Sertraline Hcl Other (See Comments)    Cloudy thoughts, malaise    Past Medical History:  Diagnosis Date  . Allergy   . Aortic atherosclerosis ([place])   . Arthritis   . CKD (chronic kidney disease), stage IV ([place])   . COPD (chronic obstructive pulmonary disease) ([place])    a. 11/2014 PFT's: FEV1 0.87 (38%),  FVC 1.41 (48%), FEF 25-75 0.41 (19%). Unable to perform DLCO; b. 12/2017 Simple spirometry ration 67%. FEV1 1.21 L+53% predicted. FVC 1.79L 61% predicted.  . Coronary artery calcification seen on CT scan    a. 03/2017 CT Chest.  . Depression   . Diabetic neuropathy ([place])   . Esophageal stricture   . Familial hematuria   . GERD (gastroesophageal reflux disease)   . Hyperlipidemia   . Hypertension   . IBS (irritable bowel syndrome)   . Nephrolithiasis   . NIDDM (non-insulin dependent diabetes mellitus)   . Obesity, unspecified   . Pulmonary hypertension ([place])    a. WHO Group 3; b. 10/2016 Echo: EF 55-60%, no rwma, Gr1 DD, mild to mod AS, mean grad 41mHg, mildly dil RV w/ mildly reduced RV fxn, mildly dil RA. PASP 1129mg; c. 12/2017 RHC: RA 11, RV 92/12, PA 92/33(57), PCWP 15, Fick CO/CI 5.9/3.3. PVR 7.2 WU. Ao 93%, PA 62/64%.  . Marland KitchenVD (peripheral vascular disease) ([place]    Past Surgical History:  Procedure Laterality Date  . ABDOMINAL HYSTERECTOMY    . CARPAL TUNNEL RELEASE    . CESAREAN SECTION    . ERCP N/A 03/17/2017   Procedure: ENDOSCOPIC RETROGRADE CHOLANGIOPANCREATOGRAPHY (ERCP);  Surgeon: WoLucilla LameMD;  [facility];  Service: Endoscopy;  Laterality: N/A;  . EUS N/A 03/13/2017   Procedure: FULL UPPER ENDOSCOPIC ULTRASOUND (EUS) RADIAL;  Surgeon: Burbridge, ReMurray HodgkinsMD;  Location: [facility] ENDOSCOPY;  Service: Endoscopy;  Laterality: N/A;  . RIGHT HEART CATH N/A 01/22/2018   Procedure: RIGHT HEART CATH;  Surgeon: BeJolaine ArtistMD;  [facility];  Service: Cardiovascular;  Laterality: N/A;  . RIGHT HEART CATHETERIZATION N/A 12/05/2014   Procedure: RIGHT HEART CATH;  Surgeon: HeSinclair GroomsMD;  [facility];  Service: Cardiovascular;  Laterality: N/A;    Family History  Problem Relation Age of Onset  . Diabetes Mother   . Cancer Mother        ovarian,melanoma  . Allergies Father   . Cancer Father        lung  . Cancer Maternal  Grandmother        uterine  . Emphysema Paternal Grandfather   . Allergies Sister   . Breast cancer Neg Hx     Social History   Socioeconomic History  . Marital status: Legally Separated    Spouse name: Not on file  . Number of children: 1  . Years of education: Not on file  . Highest education level: Not on file  Occupational History  . 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 Financial resource strain: Not on file  . Food insecurity    Worry: Not on file    Inability: Not on file  . Transportation needs    Medical: Not on file    Non-medical: Not on file  Tobacco Use  . Smoking status: Former Smoker    Packs/day: 1.50    Years: 33.00    Pack years: 49.50    Types: Cigarettes    Quit date: 07/28/2005    Years since quitting: 14.1  . Smokeless tobacco: Never Used  Substance and Sexual Activity  . Alcohol use: No    Alcohol/week: 0.0 standard drinks    Comment: heavy in the past  . Drug use: No  . Sexual activity: Never  Lifestyle  . Physical activity    Days per week: Not on file    Minutes per session: Not on file  . Stress: Not on file  Relationships  . Social Herbalist on phone: Not on file    Gets together: Not on file    Attends religious service: Not on file    Active member of club or organization: Not on file    Attends meetings of clubs or organizations: Not on file    Relationship status: Not on file  . Intimate partner violence    Fear of current or ex partner: Not on file    Emotionally abused: Not on file    Physically abused: Not on file    Forced sexual activity: Not on file  Other Topics Concern  . Not on file  Social History Narrative   No living will   Son [name] (then mom) should make decisions for her if she is unable.    Would accept resuscitation attempts but no prolonged ventilation   Not sure about tube feeds but probably wouldn't want them if cognitively unaware    Review of Systems Sleeping better now Appetite is good Weight is stable    Objective:   Physical Exam  Constitutional: She appears well-developed. No distress.  Neck: No thyromegaly present.  Cardiovascular: Normal rate and regular rhythm. Exam reveals no gallop.  Loud systolic murmur left sternal border and apex  Respiratory: Effort normal and breath sounds normal. No respiratory distress. She has no wheezes. She has no rales.  Musculoskeletal:     Comments: Trace pitting edema in ankles  Lymphadenopathy:    She has no cervical adenopathy.  Neurological:  Decreased sensation in feet  Skin:  No foot ulcers Purpuric lesions across arms---some in legs  Psychiatric: She has a normal mood and affect. Her behavior is normal.           Assessment & Plan:

## 2019-09-27 NOTE — Assessment & Plan Note (Signed)
Still seems to have adequate control

## 2019-09-28 LAB — PAIN MGMT, PROFILE 8 W/CONF, U
6 Acetylmorphine: NEGATIVE ng/mL
Alcohol Metabolites: NEGATIVE ng/mL (ref <500)
Amphetamines: NEGATIVE ng/mL
Benzodiazepines: NEGATIVE ng/mL
Buprenorphine, Urine: NEGATIVE ng/mL
Cocaine Metabolite: NEGATIVE ng/mL
Creatinine: 14.3 mg/dL
MDMA: NEGATIVE ng/mL
Marijuana Metabolite: NEGATIVE ng/mL
Opiates: NEGATIVE ng/mL
Oxidant: NEGATIVE ug/mL
Oxycodone: NEGATIVE ng/mL
Specific Gravity: 1.005 (ref >=1.0)
pH: 7.5 (ref 4.5–9.0)

## 2019-09-28 LAB — LIPID PANEL
Cholesterol: 158 mg/dL (ref 0–200)
HDL: 53.4 mg/dL (ref >39.00)
LDL Cholesterol: 80 mg/dL (ref 0–99)
NonHDL: 105.02
Total CHOL/HDL Ratio: 3
Triglycerides: 127 mg/dL (ref 0.0–149.0)
VLDL: 25.4 mg/dL (ref 0.0–40.0)

## 2019-09-28 LAB — HEMOGLOBIN A1C: Hgb A1c MFr Bld: 8.8 % — ABNORMAL HIGH (ref 4.6–6.5)

## 2019-09-30 DIAGNOSIS — E871 Hypo-osmolality and hyponatremia: Secondary | ICD-10-CM | POA: Diagnosis not present

## 2019-09-30 DIAGNOSIS — R600 Localized edema: Secondary | ICD-10-CM | POA: Diagnosis not present

## 2019-09-30 DIAGNOSIS — N2581 Secondary hyperparathyroidism of renal origin: Secondary | ICD-10-CM | POA: Diagnosis not present

## 2019-09-30 DIAGNOSIS — N184 Chronic kidney disease, stage 4 (severe): Secondary | ICD-10-CM | POA: Diagnosis not present

## 2019-10-03 DIAGNOSIS — J439 Emphysema, unspecified: Secondary | ICD-10-CM | POA: Diagnosis not present

## 2019-10-06 ENCOUNTER — Other Ambulatory Visit: Payer: Self-pay

## 2019-10-06 NOTE — Telephone Encounter (Signed)
Name of Medication:Methadone 10 mg Name of [facility] or Written Date and Quantity:# 240 on 08/25/19  Last Office Visit and Type:09/27/19 on 65mh FU  Next Office Visit and Type:01/03/2020 AWV  Last Controlled Substance Agreement Date:11/16/2017  Last UDS:09/27/19

## 2019-10-07 MED ORDER — METHADONE HCL 10 MG PO TABS: 20.0000 mg | ORAL_TABLET | Freq: Four times a day (QID) | ORAL | 0 refills | Status: DC | PRN

## 2019-10-27 ENCOUNTER — Ambulatory Visit (INDEPENDENT_AMBULATORY_CARE_PROVIDER_SITE_OTHER): Payer: PPO | Admitting: Family Medicine

## 2019-10-27 ENCOUNTER — Other Ambulatory Visit: Payer: Self-pay

## 2019-10-27 ENCOUNTER — Telehealth: Payer: Self-pay

## 2019-10-27 ENCOUNTER — Encounter: Payer: Self-pay | Admitting: Family Medicine

## 2019-10-27 VITALS — BP 158/84 | HR 87 | Temp 98.2°F | Ht 62.0 in | Wt 159.5 lb

## 2019-10-27 DIAGNOSIS — I272 Pulmonary hypertension, unspecified: Secondary | ICD-10-CM | POA: Diagnosis not present

## 2019-10-27 DIAGNOSIS — I5032 Chronic diastolic (congestive) heart failure: Secondary | ICD-10-CM | POA: Diagnosis not present

## 2019-10-27 DIAGNOSIS — R609 Edema, unspecified: Secondary | ICD-10-CM

## 2019-10-27 DIAGNOSIS — N184 Chronic kidney disease, stage 4 (severe): Secondary | ICD-10-CM | POA: Diagnosis not present

## 2019-10-27 NOTE — Progress Notes (Signed)
Chief Complaint  Patient presents with  . Edema    Lower legs/feet    History of Present Illness: HPI   63 year old female patient of Dr. Silvio Pate on continuous oxygen with COPD, chronic cor pulmonale, diabetes, HfpEF CKD and PV presents with peripheral edema bilaterally.   She is  Followed by renal.. last GFR 27  On 09/30/2019 Noted lower extremity edema that day She is on torsemide 40 mg daily. She has been taking that regularly.   She presents today with worsened swelling in  Bilateral, left leg > right .. more so compared to usual. Noted in last 4-5 days  She has also noted red area on dorsal foot in last 2-3 days. Left foot is more sore than usual and tingly.   No change in SOB, no chest pain. no fever, no flu like symptoms.. she feels well otherwise.  Avoids salt in diet... but has had Ham lately.  she has been somewhat less active.  BP elevated slightly.    This visit occurred during the SARS-CoV-2 public health emergency.  Safety protocols were in place, including screening questions prior to the visit, additional usage of staff PPE, and extensive cleaning of exam room while observing appropriate contact time as indicated for disinfecting solutions.   COVID 19 screen:  No recent travel or known exposure to COVID19 The patient denies respiratory symptoms of COVID 19 at this time. The importance of social distancing was discussed today.     ROS    Past Medical History:  Diagnosis Date  . Allergy   . Aortic atherosclerosis ([place])   . Arthritis   . CKD (chronic kidney disease), stage IV ([place])   . COPD (chronic obstructive pulmonary disease) ([place])    a. 11/2014 PFT's: FEV1 0.87 (38%), FVC 1.41 (48%), FEF 25-75 0.41 (19%). Unable to perform DLCO; b. 12/2017 Simple spirometry ration 67%. FEV1 1.21 L+53% predicted. FVC 1.79L 61% predicted.  . Coronary artery calcification seen on CT scan    a. 03/2017 CT Chest.  . Depression   . Diabetic neuropathy ([place])   . Esophageal  stricture   . Familial hematuria   . GERD (gastroesophageal reflux disease)   . Hyperlipidemia   . Hypertension   . IBS (irritable bowel syndrome)   . Nephrolithiasis   . NIDDM (non-insulin dependent diabetes mellitus)   . Obesity, unspecified   . Pulmonary hypertension ([place])    a. WHO Group 3; b. 10/2016 Echo: EF 55-60%, no rwma, Gr1 DD, mild to mod AS, mean grad 58mHg, mildly dil RV w/ mildly reduced RV fxn, mildly dil RA. PASP 1171mg; c. 12/2017 RHC: RA 11, RV 92/12, PA 92/33(57), PCWP 15, Fick CO/CI 5.9/3.3. PVR 7.2 WU. Ao 93%, PA 62/64%.  . Marland KitchenVD (peripheral vascular disease) ([place]    reports that she quit smoking about 14 years ago. Her smoking use included cigarettes. She has a 49.50 pack-year smoking history. She has never used smokeless tobacco. She reports that she does not drink alcohol or use drugs.   Current Outpatient Medications:  .  albuterol (PROVENTIL) (2.5 MG/3ML) 0.083% nebulizer solution, Take 3 mLs (2.5 mg total) by nebulization every 6 (six) hours as needed for wheezing or shortness of breath., Disp: 360 mL, Rfl: 5 .  albuterol (VENTOLIN HFA) 108 (90 Base) MCG/ACT inhaler, USE 2 PUFFS EVERY SIX HOURS AS NEEDED FOR WHEEZING OR SHORTNESS OF BREATH, Disp: 18 g, Rfl: 5 .  ALPRAZolam (XANAX) 0.25 MG tablet, Take 1 tablet (0.25 mg total)  by mouth daily as needed for anxiety., Disp: 30 tablet, Rfl: 0 .  aspirin EC 81 MG tablet, Take 1 tablet (81 mg total) by mouth daily., Disp: 90 tablet, Rfl: 3 .  calcitRIOL (ROCALTROL) 0.5 MCG capsule, Take 0.5 mcg by mouth daily. , Disp: , Rfl:  .  cholecalciferol (VITAMIN D) 1000 units tablet, Take 1,000 Units by mouth at bedtime. , Disp: , Rfl:  .  ferrous sulfate 325 (65 FE) MG tablet, Take 325 mg by mouth daily with breakfast., Disp: , Rfl:  .  gabapentin (NEURONTIN) 300 MG capsule, Take 1 capsule in the am and 2 at bedtime., Disp: 90 capsule, Rfl: 11 .  glucose blood (ONE TOUCH ULTRA TEST) test strip, USE 1 STRIP TO TEST BLOOD SUGAR  ONCE DAILY Dx Code E11.51, Disp: 100 each, Rfl: 3 .  hydrocortisone 2.5 % cream, Apply topically 3 (three) times daily as needed., Disp: 28 g, Rfl: 3 .  Insulin Glargine (LANTUS SOLOSTAR) 100 UNIT/ML Solostar Pen, Inject 20 Units into the skin at bedtime., Disp: 9 pen, Rfl: 5 .  Insulin Pen Needle (SURE COMFORT PEN NEEDLES) 31G X 5 MM MISC, USE 1 PEN NEEDLE TO INJECT INSULIN, Disp: 90 each, Rfl: 3 .  loratadine (CLARITIN) 10 MG tablet, Take 10 mg by mouth 2 (two) times daily., Disp: , Rfl:  .  lovastatin (MEVACOR) 40 MG tablet, Take 2 tablets (80 mg total) by mouth at bedtime., Disp: 180 tablet, Rfl: 3 .  methadone (DOLOPHINE) 10 MG tablet, Take 2 tablets (20 mg total) by mouth every 6 (six) hours as needed., Disp: 240 tablet, Rfl: 0 .  metoprolol tartrate (LOPRESSOR) 25 MG tablet, Take 0.5 tablets (12.5 mg total) by mouth 2 (two) times daily., Disp: 90 tablet, Rfl: 3 .  mometasone (ASMANEX, 120 METERED DOSES,) 220 MCG/INH inhaler, Inhale 2 puffs into the lungs 2 (two) times daily., Disp: 1 Inhaler, Rfl: 6 .  nitroGLYCERIN (NITROSTAT) 0.4 MG SL tablet, Place 1 tablet (0.4 mg total) under the tongue every 5 (five) minutes as needed for chest pain., Disp: 100 tablet, Rfl: 1 .  ondansetron (ZOFRAN ODT) 4 MG disintegrating tablet, Take 1 tablet (4 mg total) by mouth every 8 (eight) hours as needed for nausea or vomiting., Disp: 20 tablet, Rfl: 0 .  pantoprazole (PROTONIX) 40 MG tablet, Take 1 tablet (40 mg total) by mouth 2 (two) times daily., Disp: 180 tablet, Rfl: 3 .  STIOLTO RESPIMAT 2.5-2.5 MCG/ACT AERS, INHALE 2 PUFFS BY MOUTH INTO THE LUNGS DAILY., Disp: 4 g, Rfl: 3 .  Treprostinil (TYVASO) 0.6 MG/ML SOLN, Inhale 18 mcg into the lungs 4 (four) times daily., Disp: , Rfl:  .  valACYclovir (VALTREX) 1000 MG tablet, Take 1,000 mg by mouth daily as needed., Disp: , Rfl: 3 .  torsemide (DEMADEX) 20 MG tablet, Take 2 tablets (40 mg total) by mouth daily., Disp: 180 tablet, Rfl: 3    Observations/Objective: Blood pressure (!) 158/84, pulse 87, temperature 98.2 F (36.8 C), temperature source Temporal, height _0  (1.575 m), weight 159 lb 8 oz (72.3 kg), SpO2 92 %.  Physical Exam Constitutional:      General: She is not in acute distress.    Appearance: Normal appearance. She is well-developed. She is not ill-appearing or toxic-appearing.  HENT:     Head: Normocephalic.     Right Ear: Hearing, tympanic membrane, ear canal and external ear normal. Tympanic membrane is not erythematous, retracted or bulging.     Left Ear: Hearing, tympanic  membrane, ear canal and external ear normal. Tympanic membrane is not erythematous, retracted or bulging.     Nose: No mucosal edema or rhinorrhea.     Right Sinus: No maxillary sinus tenderness or frontal sinus tenderness.     Left Sinus: No maxillary sinus tenderness or frontal sinus tenderness.     Mouth/Throat:     Pharynx: Uvula midline.  Eyes:     General: Lids are normal. Lids are everted, no foreign bodies appreciated.     Conjunctiva/sclera: Conjunctivae normal.     Pupils: Pupils are equal, round, and reactive to light.  Neck:     Thyroid: No thyroid mass or thyromegaly.     Vascular: No carotid bruit.     Trachea: Trachea normal.  Cardiovascular:     Rate and Rhythm: Normal rate and regular rhythm.     Pulses: Normal pulses.     Heart sounds: Normal heart sounds, S1 normal and S2 normal. No murmur. No friction rub. No gallop.   Pulmonary:     Effort: Pulmonary effort is normal. No tachypnea or respiratory distress.     Breath sounds: Normal breath sounds. No decreased breath sounds, wheezing, rhonchi or rales.  Chest:     Comments: Bilateral 2 plus edema, chronic venous statis skin changes, bilateral varicose veins  small healing crack on dorsal foot with little surrounding erythema, no pain and no warmth. Abdominal:     General: Bowel sounds are normal.     Palpations: Abdomen is soft.     Tenderness: There  is no abdominal tenderness.  Musculoskeletal:     Cervical back: Normal range of motion and neck supple.  Skin:    General: Skin is warm and dry.     Findings: No rash.  Neurological:     Mental Status: She is alert.  Psychiatric:        Mood and Affect: Mood is not anxious or depressed.        Speech: Speech normal.        Behavior: Behavior normal. Behavior is cooperative.        Thought Content: Thought content normal.        Judgment: Judgment normal.      Assessment and Plan   Bilateral peripheral edema, likely multifactorial in pt with venous insufficiency, diastolic heart failure, CKD, with balance changed likely with recent inactivity, higher salt diet.   eval for new thyroid issue or change in reanl or liver status.  No clear sign of pulmonary edema.  Equal on exam.. no pain.. no suggestive of DVT.  Treat with few days of increased diuretic dose, elevation, increased activity and compression hose.  Smal area on dorsal foot looks like healing laceration.    Eliezer Lofts, MD

## 2019-10-27 NOTE — Telephone Encounter (Signed)
Agreed.

## 2019-10-27 NOTE — Telephone Encounter (Signed)
Pt left v/m for 2 days both feet are swollen and swelling does not go down with elevation of feet. Lt foot feels achy on and off. Pt said there is redness size of quarter on top of left foot;no red streaks noted. No itching and does not feel warm to touch. Pt also has swelling from feet to below both knees; swelling does not go down when has feet up over night. No CP or SOB. Pt is on 4L O2 round the clock. No covid symptoms,no travel and no known exposure to + covid. Pt scheduled in office appt today with Dr Diona Browner at 2:40. ED precautions given and pt voiced understanding. FYI to Dr Diona Browner.

## 2019-10-27 NOTE — Patient Instructions (Signed)
Please stop at the lab to have labs drawn.  Increase torsemide to 1.5 tabs daily x 3 days.  Elevate feet above heart.  Consider compression hose. Increase activity.  Follow up with Dr. Silvio Pate if not improving as expected.  Go to ER fi chest pain or shortness of breath.

## 2019-10-28 LAB — COMPREHENSIVE METABOLIC PANEL
ALT: 22 U/L (ref 0–35)
AST: 27 U/L (ref 0–37)
Albumin: 4.1 g/dL (ref 3.5–5.2)
Alkaline Phosphatase: 71 U/L (ref 39–117)
BUN: 29 mg/dL — ABNORMAL HIGH (ref 6–23)
CO2: 34 mEq/L — ABNORMAL HIGH (ref 19–32)
Calcium: 10 mg/dL (ref 8.4–10.5)
Chloride: 93 mEq/L — ABNORMAL LOW (ref 96–112)
Creatinine, Ser: 2.2 mg/dL — ABNORMAL HIGH (ref 0.40–1.20)
GFR: 22.34 mL/min — ABNORMAL LOW (ref >60.00)
Glucose, Bld: 160 mg/dL — ABNORMAL HIGH (ref 70–99)
Potassium: 4.3 mEq/L (ref 3.5–5.1)
Sodium: 139 mEq/L (ref 135–145)
Total Bilirubin: 0.6 mg/dL (ref 0.2–1.2)
Total Protein: 6.8 g/dL (ref 6.0–8.3)

## 2019-10-28 LAB — CBC WITH DIFFERENTIAL/PLATELET
Basophils Absolute: 0.1 10*3/uL (ref 0.0–0.1)
Basophils Relative: 0.9 % (ref 0.0–3.0)
Eosinophils Absolute: 0.1 10*3/uL (ref 0.0–0.7)
Eosinophils Relative: 1 % (ref 0.0–5.0)
HCT: 49.1 % — ABNORMAL HIGH (ref 36.0–46.0)
Hemoglobin: 16.4 g/dL — ABNORMAL HIGH (ref 12.0–15.0)
Lymphocytes Relative: 17.2 % (ref 12.0–46.0)
Lymphs Abs: 2 10*3/uL (ref 0.7–4.0)
MCHC: 33.3 g/dL (ref 30.0–36.0)
MCV: 93.5 fl (ref 78.0–100.0)
Monocytes Absolute: 0.9 10*3/uL (ref 0.1–1.0)
Monocytes Relative: 7.4 % (ref 3.0–12.0)
Neutro Abs: 8.8 10*3/uL — ABNORMAL HIGH (ref 1.4–7.7)
Neutrophils Relative %: 73.5 % (ref 43.0–77.0)
Platelets: 217 10*3/uL (ref 150.0–400.0)
RBC: 5.25 Mil/uL — ABNORMAL HIGH (ref 3.87–5.11)
RDW: 14.2 % (ref 11.5–15.5)
WBC: 11.9 10*3/uL — ABNORMAL HIGH (ref 4.0–10.5)

## 2019-10-28 LAB — TSH: TSH: 3.45 u[IU]/mL (ref 0.35–4.50)

## 2019-10-29 DIAGNOSIS — J439 Emphysema, unspecified: Secondary | ICD-10-CM | POA: Diagnosis not present

## 2019-11-10 ENCOUNTER — Other Ambulatory Visit: Payer: Self-pay | Admitting: Primary Care

## 2019-11-22 ENCOUNTER — Telehealth: Payer: Self-pay | Admitting: Primary Care

## 2019-11-22 ENCOUNTER — Ambulatory Visit (INDEPENDENT_AMBULATORY_CARE_PROVIDER_SITE_OTHER): Payer: PPO | Admitting: Family

## 2019-11-22 ENCOUNTER — Other Ambulatory Visit: Payer: Self-pay

## 2019-11-22 ENCOUNTER — Encounter: Payer: Self-pay | Admitting: Family

## 2019-11-22 VITALS — BP 140/80 | HR 96 | Ht 61.0 in | Wt 162.0 lb

## 2019-11-22 DIAGNOSIS — I272 Pulmonary hypertension, unspecified: Secondary | ICD-10-CM | POA: Diagnosis not present

## 2019-11-22 DIAGNOSIS — E785 Hyperlipidemia, unspecified: Secondary | ICD-10-CM | POA: Diagnosis not present

## 2019-11-22 DIAGNOSIS — I739 Peripheral vascular disease, unspecified: Secondary | ICD-10-CM | POA: Diagnosis not present

## 2019-11-22 DIAGNOSIS — I5032 Chronic diastolic (congestive) heart failure: Secondary | ICD-10-CM | POA: Diagnosis not present

## 2019-11-22 DIAGNOSIS — J449 Chronic obstructive pulmonary disease, unspecified: Secondary | ICD-10-CM

## 2019-11-22 DIAGNOSIS — N184 Chronic kidney disease, stage 4 (severe): Secondary | ICD-10-CM | POA: Diagnosis not present

## 2019-11-22 NOTE — Telephone Encounter (Signed)
Scheduled w/ Dr. Patsey Berthold on 12/10/2019 at 10:30 a.m.

## 2019-11-22 NOTE — Telephone Encounter (Signed)
Lm for pt

## 2019-11-22 NOTE — Progress Notes (Signed)
Office Visit    Patient Name: Lindsay Harmon Date of Encounter: 11/22/2019  Primary Care Provider:  Venia Carbon, MD Primary Cardiologist:  Glori Bickers, MD - Dr. Fletcher Anon  Electrophysiologist:  None   Chief Complaint    Lindsay Harmon is a 64 y.o. female with a hx of PAD, HTN, DM 2, IBS, previous tobacco abuse, depression, GERD, severe COPD, pulmonary hypertension with RV failure, moderate to severe aortic stenosis, carotid disease s/p left carotid endarterectomy, chronic diastolic heart failure. Presents today for follow-up of PAD, heart failure  Past Medical History    Past Medical History:  Diagnosis Date  . Allergy   . Aortic atherosclerosis ([place])   . Arthritis   . CKD (chronic kidney disease), stage IV ([place])   . COPD (chronic obstructive pulmonary disease) ([place])    a. 11/2014 PFT's: FEV1 0.87 (38%), FVC 1.41 (48%), FEF 25-75 0.41 (19%). Unable to perform DLCO; b. 12/2017 Simple spirometry ration 67%. FEV1 1.21 L+53% predicted. FVC 1.79L 61% predicted.  . Coronary artery calcification seen on CT scan    a. 03/2017 CT Chest.  . Depression   . Diabetic neuropathy ([place])   . Esophageal stricture   . Familial hematuria   . GERD (gastroesophageal reflux disease)   . Hyperlipidemia   . Hypertension   . IBS (irritable bowel syndrome)   . Nephrolithiasis   . NIDDM (non-insulin dependent diabetes mellitus)   . Obesity, unspecified   . Pulmonary hypertension ([place])    a. WHO Group 3; b. 10/2016 Echo: EF 55-60%, no rwma, Gr1 DD, mild to mod AS, mean grad 63mHg, mildly dil RV w/ mildly reduced RV fxn, mildly dil RA. PASP 1162mg; c. 12/2017 RHC: RA 11, RV 92/12, PA 92/33(57), PCWP 15, Fick CO/CI 5.9/3.3. PVR 7.2 WU. Ao 93%, PA 62/64%.  . Marland KitchenVD (peripheral vascular disease) ([place]   Past Surgical History:  Procedure Laterality Date  . ABDOMINAL HYSTERECTOMY    . CARDIAC CATHETERIZATION    . CARPAL TUNNEL RELEASE    . CESAREAN SECTION    . ERCP N/A 03/17/2017   Procedure:  ENDOSCOPIC RETROGRADE CHOLANGIOPANCREATOGRAPHY (ERCP);  Surgeon: WoLucilla LameMD;  [facility];  Service: Endoscopy;  Laterality: N/A;  . EUS N/A 03/13/2017   Procedure: FULL UPPER ENDOSCOPIC ULTRASOUND (EUS) RADIAL;  Surgeon: Burbridge, ReMurray HodgkinsMD;  Location: [facility] ENDOSCOPY;  Service: Endoscopy;  Laterality: N/A;  . RIGHT HEART CATH N/A 01/22/2018   Procedure: RIGHT HEART CATH;  Surgeon: BeJolaine ArtistMD;  [facility];  Service: Cardiovascular;  Laterality: N/A;  . RIGHT HEART CATHETERIZATION N/A 12/05/2014   Procedure: RIGHT HEART CATH;  Surgeon: HeSinclair GroomsMD;  [facility];  Service: Cardiovascular;  Laterality: N/A;    Allergies  Allergies  Allergen Reactions  . Sertraline Hcl Other (See Comments)    Cloudy thoughts, malaise    History of Present Illness    ShCELESTE TAVENNERs a [age].o. female with a hx of hx of PAD, HTN, DM 2, IBS, previous tobacco abuse, depression, GERD, severe COPD on home O2, pulmonary hypertension with RV failure, moderate to severe aortic stenosis, carotid disease s/p left carotid endarterectomy, chronic diastolic heart failure, CKDIV, hyponatremia, secondary hyperparathyroidism.  She was last seen by Dr. ArFletcher Anon/26/2020.  She has previously followed with heart failure clinic with Dr. BeHaroldine Lawsowever her last appointment appears to be 04/2018.  *Noninvasive vascular studies with ABI 0.78 on the right and  0.73 on the left.  Duplex with moderate diffuse right SFA disease and occluded mid left SFA.  Indicative of moderately reduced ABI in with SFA disease.  Cilostazol is contraindicated in the setting of heart failure.  Very pleasant lady.  Tells me she enjoys staying busy by crafting at home and makes homemade cards.  Tells me she gets chest pain every once a while.  It is relieved by 1 nitroglycerin but yesterday every couple of months.  She reports this is stable.  Reports her SOB/DOE is stable at her  baseline.  She follows with pulmonology but is overdue for appointment.  Wears 4 L of oxygen at home all the time.  Reports no new cough, wheeze, dyspnea.  Reports no lower extremity edema.  She does try to keep her lower extremities elevated when sitting.  She denies worsening claudication symptoms.  Checks her blood pressure at home and tells me her readings are routinely 130s over 80s.  Her main complaint today she feels she cannot remember things as well as she used to.  She plans to follow-up with her PCP regarding this.  EKGs/Labs/Other Studies Reviewed:   The following studies were reviewed today:  Echo 07/2018 Study Conclusions   - Left ventricle: The cavity size was normal. There was mild   concentric hypertrophy. Systolic function was normal. The   estimated ejection fraction was in the range of 60% to 65%.   Features are consistent with a pseudonormal left ventricular   filling pattern, with concomitant abnormal relaxation and   increased filling pressure (grade 2 diastolic dysfunction).   Doppler parameters are consistent with high ventricular filling   pressure. - Aortic valve: Cusp separation was reduced. Transvalvular velocity   was increased. There was moderate to severe stenosis. Peak   velocity (S): 372 cm/s. Mean gradient (S): 36 mm Hg. Valve area   (VTI): 0.7 cm^2. - Left atrium: The atrium was mildly dilated. - Right ventricle: The cavity size was mildly dilated. Systolic   function was normal. - Tricuspid valve: There was mild-moderate regurgitation. - Pulmonary arteries: Systolic pressure was severely increased,   estimated to be 90 mm Hg.  Right heart cath 12/2017 Findings:   RA = 11 RV = 92/12 PA = 92/33 (57) PCW = 15 Fick cardiac output/index = 5.9/3.3 PVR = 7.2 WU Ao sat = 93% PA sat = 62%, 64%   Assessment:   1. Severe PAH with normal left-sided pressures and cardiac output   Plan/Discussion:   Consider selective pulmonary vasodilator  therapy.   Recent Labs: 06/17/2019: Pro B Natriuretic peptide (BNP) 962.0 10/27/2019: ALT 22; BUN 29; Creatinine, Ser 2.20; Hemoglobin 16.4; Platelets 217.0; Potassium 4.3; Sodium 139; TSH 3.45  Recent Lipid Panel    Component Value Date/Time   CHOL 158 09/27/2019 1529   TRIG 127.0 09/27/2019 1529   HDL 53.40 09/27/2019 1529   CHOLHDL 3 09/27/2019 1529   VLDL 25.4 09/27/2019 1529   LDLCALC 80 09/27/2019 1529   LDLDIRECT 65.0 10/23/2016 1044    Home Medications   Current Meds  Medication Sig  . albuterol (PROVENTIL) (2.5 MG/3ML) 0.083% nebulizer solution USE 1 VIAL (3ML) BY NEBULIZATION EVERY 6HOURS AS NEEDED FOR WHEEZING OR SHORTNESS OF BREATH  . albuterol (VENTOLIN HFA) 108 (90 Base) MCG/ACT inhaler USE 2 PUFFS EVERY SIX HOURS AS NEEDED FOR WHEEZING OR SHORTNESS OF BREATH  . ALPRAZolam (XANAX) 0.25 MG tablet Take 1 tablet (0.25 mg total) by mouth daily as needed for anxiety.  Marland Kitchen aspirin  EC 81 MG tablet Take 1 tablet (81 mg total) by mouth daily.  . calcitRIOL (ROCALTROL) 0.5 MCG capsule Take 0.5 mcg by mouth daily.   . cholecalciferol (VITAMIN D) 1000 units tablet Take 1,000 Units by mouth at bedtime.   . ferrous sulfate 325 (65 FE) MG tablet Take 325 mg by mouth daily with breakfast.  . gabapentin (NEURONTIN) 300 MG capsule Take 1 capsule in the am and 2 at bedtime.  Marland Kitchen glucose blood (ONE TOUCH ULTRA TEST) test strip USE 1 STRIP TO TEST BLOOD SUGAR ONCE DAILY Dx Code E11.51  . hydrocortisone 2.5 % cream Apply topically 3 (three) times daily as needed.  . Insulin Glargine (LANTUS SOLOSTAR) 100 UNIT/ML Solostar Pen Inject 20 Units into the skin at bedtime.  . Insulin Pen Needle (SURE COMFORT PEN NEEDLES) 31G X 5 MM MISC USE 1 PEN NEEDLE TO INJECT INSULIN  . loratadine (CLARITIN) 10 MG tablet Take 10 mg by mouth 2 (two) times daily.  Marland Kitchen lovastatin (MEVACOR) 40 MG tablet Take 2 tablets (80 mg total) by mouth at bedtime.  . methadone (DOLOPHINE) 10 MG tablet Take 2 tablets (20 mg total)  by mouth every 6 (six) hours as needed.  . metoprolol tartrate (LOPRESSOR) 25 MG tablet Take 0.5 tablets (12.5 mg total) by mouth 2 (two) times daily.  . mometasone (ASMANEX, 120 METERED DOSES,) 220 MCG/INH inhaler Inhale 2 puffs into the lungs 2 (two) times daily.  . nitroGLYCERIN (NITROSTAT) 0.4 MG SL tablet Place 1 tablet (0.4 mg total) under the tongue every 5 (five) minutes as needed for chest pain.  Marland Kitchen ondansetron (ZOFRAN ODT) 4 MG disintegrating tablet Take 1 tablet (4 mg total) by mouth every 8 (eight) hours as needed for nausea or vomiting.  . pantoprazole (PROTONIX) 40 MG tablet Take 1 tablet (40 mg total) by mouth 2 (two) times daily.  Marland Kitchen STIOLTO RESPIMAT 2.5-2.5 MCG/ACT AERS INHALE 2 PUFFS BY MOUTH INTO THE LUNGS DAILY.  Marland Kitchen Treprostinil (TYVASO) 0.6 MG/ML SOLN Inhale 18 mcg into the lungs 4 (four) times daily.  . valACYclovir (VALTREX) 1000 MG tablet Take 1,000 mg by mouth daily as needed.    Review of Systems      Review of Systems  Constitution: Negative for chills, fever and malaise/fatigue.  Cardiovascular: Positive for dyspnea on exertion. Negative for chest pain, leg swelling, near-syncope, orthopnea, palpitations and syncope.  Respiratory: Positive for shortness of breath. Negative for cough and wheezing.   Gastrointestinal: Negative for nausea and vomiting.  Neurological: Negative for dizziness, light-headedness and weakness.  Psychiatric/Behavioral: Positive for memory loss.   All other systems reviewed and are otherwise negative except as noted above.  Physical Exam    VS:  BP 140/80 (BP Location: Left Arm, Patient Position: Sitting, Cuff Size: Normal)   Pulse 96   Ht _0  (1.549 m)   Wt 162 lb (73.5 kg)   SpO2 90% Comment: on 5L O2  BMI 30.61 kg/m  , BMI Body mass index is 30.61 kg/m. GEN: Frail looking in no acute distress. HEENT: normal. Neck: Supple, no JVD or masses. Cardiac: RRR, no  rubs, or gallops. 2/6 systolic murmur. No clubbing, cyanosis, edema.   Radials 2+ and equal bilaterally.  Respiratory:  Respirations regular and unlabored. Diminished bases bilaterally. On 5L O2. GI: Soft, nontender, nondistended, BS + x 4. MS: No deformity or atrophy. Skin: Warm and dry, no rash. Neuro:  Strength and sensation are intact. Psych: Normal affect.  Accessory Clinical Findings    ECG  personally reviewed by me today - SR 96 bpm with right atrial enlargement, rightward axis, pulmonary disease pattern and nonspecific ST/T wave changes - no acute changes.  Assessment & Plan    1. PAD - Stable with no worsening symptoms of claudication. Moderately reduced ABI bilaterally with SFA disease. Per Dr. Fletcher Anon due to comorbidities, reserve angiography and revascularization to worsening ischemia manifested by critical limb ischemia. No Cilostazol secondary to HF. Continue medical therapy including Aspirin 21m daily. Statin, as below.  2. Severe COPD with pulmonary HTN - On 4L O2 at home, on 5L in office today. No new SOB, DOE, wheeze. Overdue for follow up with pulmonology, she was given their number and encouraged to call for an appointment.   3. Chronic diastolic heart failure - Euvolemic on exam. NYHA II-III likely multifactorial with severe COPD, pulmonary HTN. Continue present dose of Torsemide. Overdue for follow up with Dr. BHaroldine Lawsand HF clinic.   4. HLD -  Lipid profile 09/27/19 with LDL 80. She was previously on Pravstatin prescribed by her PCP, but this has been stopped for unclear reason. Will reach out to PCP for clarification.   5. CKathrynwith nephrology. 10/27/19 creatinine 2.2, GFR 22. Careful titration of antihypertensives and diuretics.   Disposition: Follow up in 4 month(s) with Dr. ATor Netters NP 11/22/2019, 1:01 PM

## 2019-11-22 NOTE — Patient Instructions (Signed)
Medication Instructions:  No medication changes today.  *If you need a refill on your cardiac medications before your next appointment, please call your pharmacy*  Lab Work: No lab work today.  Testing/Procedures: You had an EKG today. It was stable compared to previous.   Follow-Up: At [facility], you and your health needs are our priority.  As part of our continuing mission to provide you with exceptional heart care, we have created designated Provider Care Teams.  These Care Teams include your primary Cardiologist (physician) and Advanced Practice Providers (APPs -  Physician Assistants and Nurse Practitioners) who all work together to provide you with the care you need, when you need it.  Your next appointment:   4 month(s)  The format for your next appointment:   In Person  Provider:    You may see Dr. Fletcher Anon or one of the following Advanced Practice Providers on your designated Care Team:    Murray Hodgkins, NP  Christell Faith, PA-C  Marrianne Mood, PA-C   Other Instructions   Due for follow up with pulmonology (the lung doctor). Their phone number is [phone]. You can ask them about getting to be set up to be seen in [place], as well.   Due for follow up with Dr. Haroldine Laws. I will forward our office note to them. Their phone number to schedule an appointment is [phone].  COVID-19 Vaccine:    [facility] COVID19 Vaccine Waitlist [email or web address]   Lindsay Harmon [facility] (presently only 29 and older, but hopefully will update soon) [phone]

## 2019-11-22 NOTE — Telephone Encounter (Signed)
Previous [facility] patient being seen for pulmonary hypertension, COPD and chronic respiratory failure. She needs to establish with new MD first available. She would like to be seen in [place] if possible.

## 2019-11-23 NOTE — Telephone Encounter (Signed)
Noted. Will close encounter.

## 2019-11-27 ENCOUNTER — Other Ambulatory Visit: Payer: Self-pay | Admitting: Internal Medicine

## 2019-12-03 ENCOUNTER — Other Ambulatory Visit: Payer: Self-pay

## 2019-12-03 MED ORDER — METHADONE HCL 10 MG PO TABS: 20.0000 mg | ORAL_TABLET | Freq: Four times a day (QID) | ORAL | 0 refills | Status: DC | PRN

## 2019-12-03 NOTE — Telephone Encounter (Signed)
Patient called requesting refill for Methadone. Patient is aware Dr Silvio Pate is out of the office this afternoon and she is ok to wait until Monday.  Name of Medication:Methadone 10 mg Name of [place] or Written Date and Quantity:# 240 on 10/07/2019 Last Office Visit and Type: 10/27/2019 with Dr Diona Browner for edema. Next Office Visit and Type:01/03/2020 [facility]  Last Controlled Substance Agreement Date:11/16/2017  Last UDS:09/27/19

## 2019-12-06 ENCOUNTER — Ambulatory Visit: Payer: PPO | Admitting: Oncology

## 2019-12-06 ENCOUNTER — Other Ambulatory Visit: Payer: PPO

## 2019-12-06 ENCOUNTER — Other Ambulatory Visit (HOSPITAL_COMMUNITY): Payer: Self-pay | Admitting: Internal Medicine

## 2019-12-10 ENCOUNTER — Other Ambulatory Visit: Payer: Self-pay

## 2019-12-10 ENCOUNTER — Ambulatory Visit: Payer: PPO | Admitting: Pulmonary Disease

## 2019-12-10 ENCOUNTER — Encounter: Payer: Self-pay | Admitting: Pulmonary Disease

## 2019-12-10 VITALS — BP 142/80 | HR 88 | Temp 97.7°F | Ht 61.0 in | Wt 160.0 lb

## 2019-12-10 DIAGNOSIS — I272 Pulmonary hypertension, unspecified: Secondary | ICD-10-CM

## 2019-12-10 MED ORDER — FLUTTER DEVI: 0 refills | Status: AC

## 2019-12-10 NOTE — Progress Notes (Signed)
 Assessment & Plan:  1. Pulmonary hypertension (HCC) (Primary)   Patient Instructions  Have provided you with Acapella flutter valve to loosen up secretions  I will send a note to Dr. Marcelino to see if changing your fluid pill to Bumex may be of help  We will repeat a 2D echo to reevaluate your pulmonary artery pressure  We will see her in follow-up in 3 months time call sooner should any new difficulties arise    Please note: late entry documentation due to logistical difficulties during COVID-19 pandemic. This note is filed for information purposes only, and is not intended to be used for billing, nor does it represent the full scope/nature of the visit in question. Please see any associated scanned media linked to date of encounter for additional pertinent information.  Subjective:    HPI: Lindsay Harmon is a 64 y.o. female presenting to the pulmonology clinic on 12/10/2019 with report of: Follow-up (former BQ pt-- pt reports of occ rattling in chest and sob. )     Outpatient Encounter Medications as of 12/10/2019  Medication Sig   albuterol  (PROVENTIL ) (2.5 MG/3ML) 0.083% nebulizer solution USE 1 VIAL ( ) BY NEBULIZATION EVERY 6HOURS AS NEEDED FOR WHEEZING OR SHORTNESS OF BREATH (Patient taking differently: Take 2.5 mg by nebulization every 6 (six) hours as needed for wheezing or shortness of breath.)   ALPRAZolam  (XANAX ) 0.25 MG tablet Take 1 tablet (0.25 mg total) by mouth daily as needed for anxiety.   aspirin  EC 81 MG tablet Take 1 tablet (81 mg total) by mouth daily.   calcitRIOL  (ROCALTROL ) 0.5 MCG capsule Take 0.5 mcg by mouth daily.    cholecalciferol  (VITAMIN D ) 1000 units tablet Take 1,000 Units by mouth at bedtime.    loratadine  (CLARITIN ) 10 MG tablet Take 10 mg by mouth 2 (two) times daily.   Treprostinil  (TYVASO ) 0.6 MG/ML SOLN Inhale 18 mcg into the lungs 4 (four) times daily.   [DISCONTINUED] albuterol  (VENTOLIN  HFA) 108 (90 Base) MCG/ACT inhaler USE 2  PUFFS EVERY SIX HOURS AS NEEDED FOR WHEEZING OR SHORTNESS OF BREATH   [DISCONTINUED] ferrous sulfate  325 (65 FE) MG tablet Take 325 mg by mouth daily with breakfast.   [DISCONTINUED] gabapentin  (NEURONTIN ) 300 MG capsule Take 1 capsule in the am and 2 at bedtime.   [DISCONTINUED] glucose blood (ONE TOUCH ULTRA TEST) test strip USE 1 STRIP TO TEST BLOOD SUGAR ONCE DAILY Dx Code E11.51   [DISCONTINUED] hydrocortisone  2.5 % cream Apply topically 3 (three) times daily as needed. (Patient not taking: Reported on 01/03/2020)   [DISCONTINUED] Insulin  Pen Needle (SURE COMFORT PEN NEEDLES) 31G X 5 MM MISC USE 1 PEN NEEDLE TO INJECT INSULIN    [DISCONTINUED] LANTUS  SOLOSTAR 100 UNIT/ML Solostar Pen INJECT 20 UNITS INTO THE SKIN AT BEDTIME (Patient taking differently: Inject 20 Units into the skin daily. )   [DISCONTINUED] lovastatin  (MEVACOR ) 40 MG tablet Take 2 tablets (80 mg total) by mouth at bedtime.   [DISCONTINUED] methadone  (DOLOPHINE ) 10 MG tablet Take 2 tablets (20 mg total) by mouth every 6 (six) hours as needed. (Patient taking differently: Take 20 mg by mouth in the morning, at noon, in the evening, and at bedtime. )   [DISCONTINUED] metoprolol  tartrate (LOPRESSOR ) 25 MG tablet Take 0.5 tablets (12.5 mg total) by mouth 2 (two) times daily.   [DISCONTINUED] mometasone  (ASMANEX , 120 METERED DOSES,) 220 MCG/INH inhaler Inhale 2 puffs into the lungs 2 (two) times daily.   [DISCONTINUED] nitroGLYCERIN  (NITROSTAT ) 0.4 MG SL  tablet Place 1 tablet (0.4 mg total) under the tongue every 5 (five) minutes as needed for chest pain.   [DISCONTINUED] ondansetron  (ZOFRAN  ODT) 4 MG disintegrating tablet Take 1 tablet (4 mg total) by mouth every 8 (eight) hours as needed for nausea or vomiting. (Patient not taking: Reported on 01/03/2020)   [DISCONTINUED] pantoprazole  (PROTONIX ) 40 MG tablet Take 1 tablet (40 mg total) by mouth 2 (two) times daily.   [DISCONTINUED] STIOLTO RESPIMAT  2.5-2.5 MCG/ACT AERS INHALE 2 PUFFS BY  MOUTH INTO THE LUNGS DAILY.   [DISCONTINUED] torsemide  (DEMADEX ) 20 MG tablet TAKE 2 TABLETS BY MOUTH EVERY DAY   [DISCONTINUED] valACYclovir  (VALTREX ) 1000 MG tablet Take 1,000 mg by mouth daily as needed.   Respiratory Therapy Supplies (FLUTTER) DEVI Use as directed   No facility-administered encounter medications on file as of 12/10/2019.      Objective:   Vitals:   12/10/19 1048  BP: (!) 142/80  Pulse: 88  Temp: 97.7 F (36.5 C)  Height: 5' 1 (1.549 m)  Weight: 160 lb (72.6 kg)  SpO2: 90%  TempSrc: Temporal  BMI (Calculated): 30.25     Physical exam documentation is limited by delayed entry of information.

## 2019-12-10 NOTE — Patient Instructions (Signed)
Have provided you with Acapella flutter valve to loosen up secretions  I will send a note to Dr. Holley Raring to see if changing your fluid pill to Bumex may be of help  We will repeat a 2D echo to reevaluate your pulmonary artery pressure  We will see her in follow-up in 3 months time call sooner should any new difficulties arise

## 2019-12-13 ENCOUNTER — Inpatient Hospital Stay: Payer: PPO | Attending: Oncology | Admitting: Oncology

## 2019-12-13 ENCOUNTER — Encounter: Payer: Self-pay | Admitting: Oncology

## 2019-12-13 ENCOUNTER — Inpatient Hospital Stay: Payer: PPO

## 2019-12-13 ENCOUNTER — Other Ambulatory Visit: Payer: Self-pay

## 2019-12-13 VITALS — BP 112/78 | HR 96 | Temp 97.6°F | Resp 18 | Wt 162.1 lb

## 2019-12-13 DIAGNOSIS — D751 Secondary polycythemia: Secondary | ICD-10-CM

## 2019-12-13 DIAGNOSIS — D638 Anemia in other chronic diseases classified elsewhere: Secondary | ICD-10-CM | POA: Diagnosis not present

## 2019-12-13 DIAGNOSIS — J9610 Chronic respiratory failure, unspecified whether with hypoxia or hypercapnia: Secondary | ICD-10-CM | POA: Diagnosis not present

## 2019-12-13 DIAGNOSIS — Z801 Family history of malignant neoplasm of trachea, bronchus and lung: Secondary | ICD-10-CM | POA: Diagnosis not present

## 2019-12-13 DIAGNOSIS — Z7982 Long term (current) use of aspirin: Secondary | ICD-10-CM | POA: Insufficient documentation

## 2019-12-13 DIAGNOSIS — K589 Irritable bowel syndrome without diarrhea: Secondary | ICD-10-CM | POA: Diagnosis not present

## 2019-12-13 DIAGNOSIS — I10 Essential (primary) hypertension: Secondary | ICD-10-CM | POA: Insufficient documentation

## 2019-12-13 DIAGNOSIS — Z79899 Other long term (current) drug therapy: Secondary | ICD-10-CM | POA: Insufficient documentation

## 2019-12-13 DIAGNOSIS — Z8049 Family history of malignant neoplasm of other genital organs: Secondary | ICD-10-CM | POA: Diagnosis not present

## 2019-12-13 DIAGNOSIS — Z8041 Family history of malignant neoplasm of ovary: Secondary | ICD-10-CM | POA: Diagnosis not present

## 2019-12-13 DIAGNOSIS — N184 Chronic kidney disease, stage 4 (severe): Secondary | ICD-10-CM | POA: Insufficient documentation

## 2019-12-13 DIAGNOSIS — E119 Type 2 diabetes mellitus without complications: Secondary | ICD-10-CM | POA: Insufficient documentation

## 2019-12-13 DIAGNOSIS — Z87891 Personal history of nicotine dependence: Secondary | ICD-10-CM | POA: Insufficient documentation

## 2019-12-13 DIAGNOSIS — M7989 Other specified soft tissue disorders: Secondary | ICD-10-CM

## 2019-12-13 DIAGNOSIS — Z794 Long term (current) use of insulin: Secondary | ICD-10-CM | POA: Insufficient documentation

## 2019-12-13 DIAGNOSIS — Z833 Family history of diabetes mellitus: Secondary | ICD-10-CM | POA: Insufficient documentation

## 2019-12-13 DIAGNOSIS — J9611 Chronic respiratory failure with hypoxia: Secondary | ICD-10-CM | POA: Diagnosis not present

## 2019-12-13 DIAGNOSIS — Z9981 Dependence on supplemental oxygen: Secondary | ICD-10-CM | POA: Diagnosis not present

## 2019-12-13 DIAGNOSIS — Z7951 Long term (current) use of inhaled steroids: Secondary | ICD-10-CM | POA: Diagnosis not present

## 2019-12-13 DIAGNOSIS — E209 Hypoparathyroidism, unspecified: Secondary | ICD-10-CM | POA: Diagnosis not present

## 2019-12-13 HISTORY — DX: Secondary polycythemia: D75.1

## 2019-12-13 LAB — CBC WITH DIFFERENTIAL/PLATELET
Abs Immature Granulocytes: 0.03 10*3/uL (ref 0.00–0.07)
Basophils Absolute: 0.1 10*3/uL (ref 0.0–0.1)
Basophils Relative: 1 %
Eosinophils Absolute: 0.1 10*3/uL (ref 0.0–0.5)
Eosinophils Relative: 1 %
HCT: 53 % — ABNORMAL HIGH (ref 36.0–46.0)
Hemoglobin: 16.6 g/dL — ABNORMAL HIGH (ref 12.0–15.0)
Immature Granulocytes: 0 %
Lymphocytes Relative: 22 %
Lymphs Abs: 2.1 10*3/uL (ref 0.7–4.0)
MCH: 30.4 pg (ref 26.0–34.0)
MCHC: 31.3 g/dL (ref 30.0–36.0)
MCV: 97.1 fL (ref 80.0–100.0)
Monocytes Absolute: 0.6 10*3/uL (ref 0.1–1.0)
Monocytes Relative: 6 %
Neutro Abs: 6.5 10*3/uL (ref 1.7–7.7)
Neutrophils Relative %: 70 %
Platelets: 254 10*3/uL (ref 150–400)
RBC: 5.46 MIL/uL — ABNORMAL HIGH (ref 3.87–5.11)
RDW: 14.4 % (ref 11.5–15.5)
WBC: 9.4 10*3/uL (ref 4.0–10.5)
nRBC: 0 % (ref 0.0–0.2)

## 2019-12-13 NOTE — Progress Notes (Signed)
Hematology/Oncology follow up note [facility] [phone] [phone]   Patient Care Team: Venia Carbon, MD as PCP - General Bensimhon, Shaune Pascal, MD as PCP - Cardiology (Cardiology) Bensimhon, Shaune Pascal, MD as Consulting Physician (Cardiology) Juanito Doom, MD as Consulting Physician (Pulmonary Disease) Anthonette Legato, MD as Consulting Physician (Internal Medicine) Lupita Raider, DO as Consulting Physician (Optometry) Earlie Server, MD as Consulting Physician (Hematology and Oncology)  REFERRING PROVIDER: Dr.Lateef REASON FOR VISIT Follow up for treatment of hyponatremia and hypotonic  HISTORY OF PRESENTING ILLNESS:  Lindsay Harmon is a  64 y.o.  female with PMH listed below who was referred to me for evaluation of hyponatremia.  Patient has history of chronic kidney disease stage IV, proteinuria, hyponatremia lower extremity edema, secondary hyperparathyroidism and anemia of chronic disease follows up with Dr. Holley Raring.  Extensive medical records review was performed.  Patient has a history of hyponatremia and has been maintained on torsemide and fluid restriction.  Recent serum sodium has been stable. Reviewed patient's previous lab results since 2008 in [place].  She has a history of chronic hyponatremia in 2016 with sodium level running low around 120s.  Hyponatremia was considered to be secondary to volume overload/CHF compounded by Aldactone. Patient was transitioned to Lasix and then later switched to Torsemide  Sodium level appears to improve in 2017 and 2018 with average level around 1 30-1 35.  This year patient's hyponatremia has been well controlled with all readings above 130.  She has chronic respiratory failure and on home oxygen. She reports feeling fatigue, which is chronic for her.   INTERVAL HISTORY Lindsay Harmon is a 64 y.o. female who has above history reviewed by me today presents for follow up visit for management  of erythrocytosis. Patient reports feeling more shortness of breath lately. Patient has chronic COPD and respiratory failure on home oxygen 4 L. She follows up with pulmonologist Dr. Patsey Berthold.  Was last seen by he. Review of Systems  Constitutional: Positive for malaise/fatigue. Negative for chills, fever and weight loss.  HENT: Negative for nosebleeds and sore throat.   Eyes: Negative for double vision, photophobia and redness.  Respiratory: Positive for shortness of breath. Negative for cough, hemoptysis and wheezing.   Cardiovascular: Negative for chest pain, palpitations, orthopnea and leg swelling.  Gastrointestinal: Negative for abdominal pain, blood in stool, nausea and vomiting.  Genitourinary: Negative for dysuria.  Musculoskeletal: Negative for back pain, myalgias and neck pain.  Skin: Negative for itching and rash.  Neurological: Negative for dizziness, tingling and tremors.  Endo/Heme/Allergies: Negative for environmental allergies. Does not bruise/bleed easily.  Psychiatric/Behavioral: Negative for depression and hallucinations.    MEDICAL HISTORY:  Past Medical History:  Diagnosis Date  . Allergy   . Aortic atherosclerosis ([place])   . Arthritis   . CKD (chronic kidney disease), stage IV ([place])   . COPD (chronic obstructive pulmonary disease) ([place])    a. 11/2014 PFT's: FEV1 0.87 (38%), FVC 1.41 (48%), FEF 25-75 0.41 (19%). Unable to perform DLCO; b. 12/2017 Simple spirometry ration 67%. FEV1 1.21 L+53% predicted. FVC 1.79L 61% predicted.  . Coronary artery calcification seen on CT scan    a. 03/2017 CT Chest.  . Depression   . Diabetic neuropathy ([place])   . Erythrocytosis 12/13/2019  . Esophageal stricture   . Familial hematuria   . GERD (gastroesophageal reflux disease)   . Hyperlipidemia   . Hypertension   . IBS (irritable bowel syndrome)   . Nephrolithiasis   .  NIDDM (non-insulin dependent diabetes mellitus)   . Obesity, unspecified   . Pulmonary hypertension ([place])      a. WHO Group 3; b. 10/2016 Echo: EF 55-60%, no rwma, Gr1 DD, mild to mod AS, mean grad 84mHg, mildly dil RV w/ mildly reduced RV fxn, mildly dil RA. PASP 117mg; c. 12/2017 RHC: RA 11, RV 92/12, PA 92/33(57), PCWP 15, Fick CO/CI 5.9/3.3. PVR 7.2 WU. Ao 93%, PA 62/64%.  . Marland KitchenVD (peripheral vascular disease) ([place]    SURGICAL HISTORY: Past Surgical History:  Procedure Laterality Date  . ABDOMINAL HYSTERECTOMY    . CARDIAC CATHETERIZATION    . CARPAL TUNNEL RELEASE    . CESAREAN SECTION    . ERCP N/A 03/17/2017   Procedure: ENDOSCOPIC RETROGRADE CHOLANGIOPANCREATOGRAPHY (ERCP);  Surgeon: WoLucilla LameMD;  [facility];  Service: Endoscopy;  Laterality: N/A;  . EUS N/A 03/13/2017   Procedure: FULL UPPER ENDOSCOPIC ULTRASOUND (EUS) RADIAL;  Surgeon: Burbridge, ReMurray HodgkinsMD;  Location: [facility];  Service: Endoscopy;  Laterality: N/A;  . RIGHT HEART CATH N/A 01/22/2018   Procedure: RIGHT HEART CATH;  Surgeon: BeJolaine ArtistMD;  [facility];  Service: Cardiovascular;  Laterality: N/A;  . RIGHT HEART CATHETERIZATION N/A 12/05/2014   Procedure: RIGHT HEART CATH;  Surgeon: HeSinclair GroomsMD;  [facility];  Service: Cardiovascular;  Laterality: N/A;    SOCIAL HISTORY: Social History   Socioeconomic History  . Marital status: Legally Separated    Spouse name: Not on file  . Number of children: 1  . Years of education: Not on file  . Highest education level: Not on file  Occupational History  . 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 Smoking status: Former Smoker    Packs/day: 1.50    Years: 33.00    Pack years: 49.50    Types: Cigarettes    Quit date: 07/28/2005    Years since quitting: 14.3  . Smokeless tobacco: Never Used  Substance and Sexual Activity  . Alcohol use: No    Alcohol/week: 0.0 standard drinks    Comment: heavy in the past  . Drug use: No  . Sexual activity: Never   Other Topics Concern  . Not on file  Social History Narrative   No living will   Son Lindsay Kotykthen mom) should make decisions for her if she is unable.    Would accept resuscitation attempts but no prolonged ventilation   Not sure about tube feeds but probably wouldn't want them if cognitively unaware   Social Determinants of Health   Financial Resource Strain:   . Difficulty of Paying Living Expenses: Not on file  Food Insecurity:   . Worried About RuCharity fundraisern the Last Year: Not on file  . Ran Out of Food in the Last Year: Not on file  Transportation Needs:   . Lack of Transportation (Medical): Not on file  . Lack of Transportation (Non-Medical): Not on file  Physical Activity:   . Days of Exercise per Week: Not on file  . Minutes of Exercise per Session: Not on file  Stress:   . Feeling of Stress : Not on file  Social Connections:   . Frequency of Communication with Friends and Family: Not on file  . Frequency of Social Gatherings with Friends and Family: Not on file  . Attends Religious Services: Not on file  . Active Member  of Clubs or Organizations: Not on file  . Attends Archivist Meetings: Not on file  . Marital Status: Not on file  Intimate Partner Violence:   . Fear of Current or Ex-Partner: Not on file  . Emotionally Abused: Not on file  . Physically Abused: Not on file  . Sexually Abused: Not on file    FAMILY HISTORY: Family History  Problem Relation Age of Onset  . Diabetes Mother   . Cancer Mother        ovarian,melanoma  . Allergies Father   . Cancer Father        lung  . Cancer Maternal Grandmother        uterine  . Emphysema Paternal Grandfather   . Allergies Sister   . Breast cancer Neg Hx     ALLERGIES:  is allergic to sertraline hcl.  MEDICATIONS:  Current Outpatient Medications  Medication Sig Dispense Refill  . albuterol (PROVENTIL) (2.5 MG/3ML) 0.083% nebulizer solution USE 1 VIAL (3ML) BY NEBULIZATION EVERY  6HOURS AS NEEDED FOR WHEEZING OR SHORTNESS OF BREATH 360 mL 0  . albuterol (VENTOLIN HFA) 108 (90 Base) MCG/ACT inhaler USE 2 PUFFS EVERY SIX HOURS AS NEEDED FOR WHEEZING OR SHORTNESS OF BREATH 18 g 5  . ALPRAZolam (XANAX) 0.25 MG tablet Take 1 tablet (0.25 mg total) by mouth daily as needed for anxiety. 30 tablet 0  . aspirin EC 81 MG tablet Take 1 tablet (81 mg total) by mouth daily. 90 tablet 3  . calcitRIOL (ROCALTROL) 0.5 MCG capsule Take 0.5 mcg by mouth daily.     . cholecalciferol (VITAMIN D) 1000 units tablet Take 1,000 Units by mouth at bedtime.     . ferrous sulfate 325 (65 FE) MG tablet Take 325 mg by mouth daily with breakfast.    . gabapentin (NEURONTIN) 300 MG capsule Take 1 capsule in the am and 2 at bedtime. 90 capsule 11  . glucose blood (ONE TOUCH ULTRA TEST) test strip USE 1 STRIP TO TEST BLOOD SUGAR ONCE DAILY Dx Code E11.51 100 each 3  . hydrocortisone 2.5 % cream Apply topically 3 (three) times daily as needed. 28 g 3  . Insulin Pen Needle (SURE COMFORT PEN NEEDLES) 31G X 5 MM MISC USE 1 PEN NEEDLE TO INJECT INSULIN 90 each 3  . LANTUS SOLOSTAR 100 UNIT/ML Solostar Pen INJECT 20 UNITS INTO THE SKIN AT BEDTIME 15 mL 0  . loratadine (CLARITIN) 10 MG tablet Take 10 mg by mouth 2 (two) times daily.    Marland Kitchen lovastatin (MEVACOR) 40 MG tablet Take 2 tablets (80 mg total) by mouth at bedtime. 180 tablet 3  . methadone (DOLOPHINE) 10 MG tablet Take 2 tablets (20 mg total) by mouth every 6 (six) hours as needed. 240 tablet 0  . metoprolol tartrate (LOPRESSOR) 25 MG tablet Take 0.5 tablets (12.5 mg total) by mouth 2 (two) times daily. 90 tablet 3  . mometasone (ASMANEX, 120 METERED DOSES,) 220 MCG/INH inhaler Inhale 2 puffs into the lungs 2 (two) times daily. 1 Inhaler 6  . nitroGLYCERIN (NITROSTAT) 0.4 MG SL tablet Place 1 tablet (0.4 mg total) under the tongue every 5 (five) minutes as needed for chest pain. 100 tablet 1  . ondansetron (ZOFRAN ODT) 4 MG disintegrating tablet Take 1  tablet (4 mg total) by mouth every 8 (eight) hours as needed for nausea or vomiting. 20 tablet 0  . pantoprazole (PROTONIX) 40 MG tablet Take 1 tablet (40 mg total) by mouth 2 (two)  times daily. 180 tablet 3  . Respiratory Therapy Supplies (FLUTTER) DEVI Use as directed 1 each 0  . STIOLTO RESPIMAT 2.5-2.5 MCG/ACT AERS INHALE 2 PUFFS BY MOUTH INTO THE LUNGS DAILY. 4 g 3  . torsemide (DEMADEX) 20 MG tablet TAKE 2 TABLETS BY MOUTH EVERY DAY 180 tablet 3  . Treprostinil (TYVASO) 0.6 MG/ML SOLN Inhale 18 mcg into the lungs 4 (four) times daily.    . valACYclovir (VALTREX) 1000 MG tablet Take 1,000 mg by mouth daily as needed.  3   No current facility-administered medications for this visit.     PHYSICAL EXAMINATION: ECOG PERFORMANCE STATUS: 2 - Symptomatic, <50% confined to bed Vitals:   12/13/19 1032  BP: 112/78  Pulse: 96  Resp: 18  Temp: 97.6 F (36.4 C)   Filed Weights   12/13/19 1032  Weight: 162 lb 1.6 oz (73.5 kg)    Physical Exam Constitutional:      General: She is not in acute distress.    Appearance: She is ill-appearing.  HENT:     Head: Normocephalic and atraumatic.  Eyes:     General: No scleral icterus.    Pupils: Pupils are equal, round, and reactive to light.  Cardiovascular:     Rate and Rhythm: Normal rate and regular rhythm.     Heart sounds: Murmur present.  Pulmonary:     Effort: Pulmonary effort is normal. No respiratory distress.     Breath sounds: No wheezing.     Comments: Decreased breath sound bilaterally. Abdominal:     General: Bowel sounds are normal. There is no distension.     Palpations: Abdomen is soft. There is no mass.     Tenderness: There is no abdominal tenderness.  Musculoskeletal:        General: Swelling present. No deformity. Normal range of motion.     Cervical back: Normal range of motion and neck supple.     Comments: Left lower extremity edema  Skin:    General: Skin is warm and dry.     Findings: No erythema or rash.   Neurological:     Mental Status: She is alert and oriented to person, place, and time. Mental status is at baseline.     Cranial Nerves: No cranial nerve deficit.     Coordination: Coordination normal.  Psychiatric:        Mood and Affect: Mood normal.        Behavior: Behavior normal.        Thought Content: Thought content normal.      LABORATORY DATA:  I have reviewed the data as listed Lab Results  Component Value Date   WBC 9.4 12/13/2019   HGB 16.6 (H) 12/13/2019   HCT 53.0 (H) 12/13/2019   MCV 97.1 12/13/2019   PLT 254 12/13/2019   Recent Labs    06/17/19 1154 10/27/19 1554  NA 139 139  K 3.9 4.3  CL 93* 93*  CO2 38* 34*  GLUCOSE 149* 160*  BUN 28* 29*  CREATININE 2.21* 2.20*  CALCIUM 9.9 10.0  PROT  --  6.8  ALBUMIN  --  4.1  AST  --  27  ALT  --  22  ALKPHOS  --  71  BILITOT  --  0.6   Iron/TIBC/Ferritin/ %Sat    Component Value Date/Time   IRON 135 07/28/2018 1054   TIBC 310 07/28/2018 1054   FERRITIN 78 07/28/2018 1054   IRONPCTSAT 44 (H) 07/28/2018 1054  RADIOGRAPHIC STUDIES: I have personally reviewed the radiological images as listed and agreed with the findings in the report. CT chest lung cancer screening 04/13/2018  1. Lung-RADS 2, benign appearance or behavior. Continue annual screening with low-dose chest CT without contrast in 12 months. 2. Age advanced coronary artery atherosclerosis. Recommend assessment of coronary risk factors and consideration of medical Therapy. 3. Aortic atherosclerosis (ICD10-I70.0) and emphysema (ICD10-J43.9). 4. Aortic valvular calcifications. Consider echocardiography to evaluate for valvular dysfunction. 5. Pulmonary artery enlargement suggests pulmonary arterial hypertension.  ASSESSMENT & PLAN:  1. Swelling of limb   2. Erythrocytosis   3. Chronic respiratory failure with hypoxia (HCC)   #Secondary erythrocytosis due to chronic respiratory failure  Labs reviewed and discussed with patient. Hemoglobin  has increased to 16.6 with hematocrit 53. Discussed about the rationale of therapeutic phlebotomy if hematocrit above 50 She agrees with the plan. Phlebotomy 300 cc unit x1. Repeat H&H monthly +/-phlebotomy 300 cc if her hematocrit above 50.  COPD/respiratory failure, follow-up with pulmonology. Left lower extremity swelling, Recommend obtain ultrasound venous duplex to rule out DVT.  Follow-up in 3 months for MD and lab assessment. Orders Placed This Encounter  Procedures  . US Venous Img Lower Unilateral Left    Standing Status:   Future    Standing Expiration Date:   12/12/2020    Order Specific Question:   Reason for Exam (SYMPTOM  OR DIAGNOSIS REQUIRED)    Answer:   swelling, rule out DVT    Order Specific Question:   Preferred imaging location?    Answer:   [facility]  . CBC with Differential/Platelet    Standing Status:   Future    Standing Expiration Date:   12/12/2020  . Hemoglobin and Hematocrit, Blood    Standing Status:   Standing    Number of Occurrences:   2    Standing Expiration Date:   12/12/2020    All questions were answered. The patient knows to call the clinic with any problems questions or concerns. We spent sufficient time to discuss many aspect of care, questions were answered to patient's satisfaction.   Earlie Server, MD, PhD Hematology Oncology [facility] at [facility] Pager- [phone] 12/13/2019

## 2019-12-13 NOTE — Progress Notes (Signed)
Patient reports an increase in her SOBr

## 2019-12-15 IMAGING — DX DG CHEST 1V PORT
1 series · 1 of 1 positions shown · non-contrast
Comparison: CT 04/13/2018

CLINICAL DATA: Acute onset shortness of breath.

EXAM:
PORTABLE CHEST 1 VIEW

[chest ap]
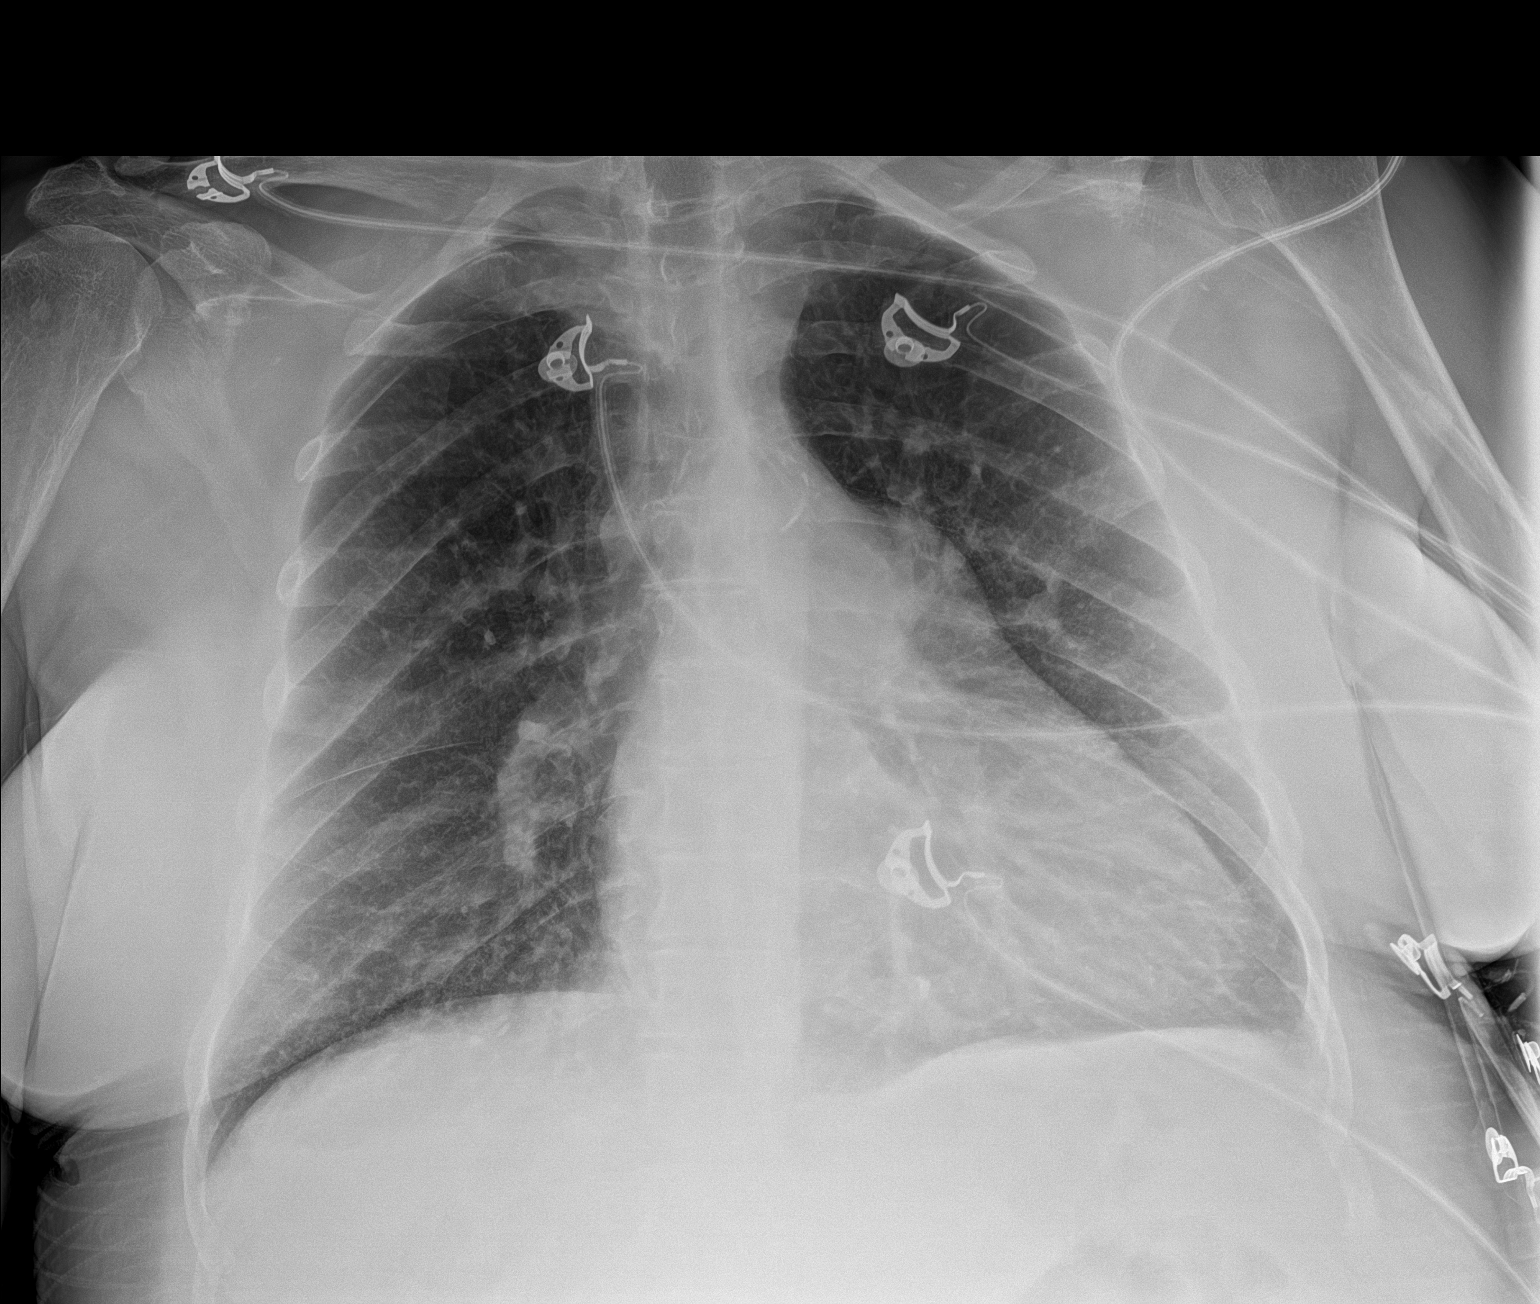

[1 of 1 positions shown; findings below may reference images not displayed]

FINDINGS: Borderline cardiomegaly with aortic atherosclerosis. No aneurysm
noted. Prominence in the region of the left atrial appendage is
likely related to the patient's known dilatation of the main
pulmonary artery likely reflecting chronic pulmonary hypertension.
Lungs are hyperinflated without pulmonary consolidation or effusion.
No pneumothorax. No acute osseous abnormality.
IMPRESSION: Borderline cardiomegaly with aortic atherosclerosis. Hyperinflated
lungs with prominence main pulmonary artery likely reflecting
chronic pulmonary hypertension and COPD.

## 2019-12-16 DIAGNOSIS — J9610 Chronic respiratory failure, unspecified whether with hypoxia or hypercapnia: Secondary | ICD-10-CM | POA: Diagnosis not present

## 2019-12-17 ENCOUNTER — Ambulatory Visit
Admission: RE | Admit: 2019-12-17 | Discharge: 2019-12-17 | Disposition: A | Discharge status: Home / Self Care | Payer: PPO | Source: Ambulatory Visit | Attending: Oncology | Admitting: Oncology

## 2019-12-17 ENCOUNTER — Other Ambulatory Visit: Payer: Self-pay

## 2019-12-17 DIAGNOSIS — R600 Localized edema: Secondary | ICD-10-CM | POA: Diagnosis not present

## 2019-12-17 DIAGNOSIS — M7989 Other specified soft tissue disorders: Secondary | ICD-10-CM | POA: Insufficient documentation

## 2019-12-22 ENCOUNTER — Other Ambulatory Visit: Payer: Self-pay | Admitting: Pulmonary Disease

## 2019-12-22 NOTE — Telephone Encounter (Signed)
[id number].

## 2019-12-23 MED ORDER — ASMANEX (120 METERED DOSES) 220 MCG/INH IN AEPB: 2.0000 | INHALATION_SPRAY | Freq: Two times a day (BID) | RESPIRATORY_TRACT | 6 refills | Status: DC

## 2019-12-23 NOTE — Telephone Encounter (Signed)
Spoke with pt and confirmed the medication and pharmacy. I let her know that the rx will be sent electronically. She verbalized her understanding and nothing further needed.

## 2019-12-23 NOTE — Telephone Encounter (Signed)
[id number].

## 2019-12-28 ENCOUNTER — Telehealth: Payer: Self-pay | Admitting: Pulmonary Disease

## 2019-12-28 ENCOUNTER — Telehealth: Payer: Self-pay | Admitting: *Deleted

## 2019-12-28 NOTE — Telephone Encounter (Signed)
Patient called asking if the Xray showed a blood clot. Informed per results No clot seen.  IMPRESSION: No evidence of DVT within the left lower extremity.   Electronically Signed   By: Sandi Mariscal M.D.   On: 12/17/2019 17:18

## 2019-12-28 NOTE — Telephone Encounter (Signed)
Called pt to inform her that she would need to contact Dr. Elyse Jarvis office since he was the ordering physician. She stated that she was mixed up. Nothing further needed.

## 2019-12-30 ENCOUNTER — Ambulatory Visit: Admission: RE | Admit: 2019-12-30 | Payer: PPO | Source: Ambulatory Visit

## 2020-01-03 ENCOUNTER — Emergency Department: Payer: PPO

## 2020-01-03 ENCOUNTER — Encounter: Payer: Self-pay | Admitting: Emergency Medicine

## 2020-01-03 ENCOUNTER — Other Ambulatory Visit: Payer: Self-pay

## 2020-01-03 ENCOUNTER — Encounter: Payer: PPO | Admitting: Internal Medicine

## 2020-01-03 ENCOUNTER — Observation Stay
Admission: EM | Admit: 2020-01-03 | Discharge: 2020-01-04 | Disposition: A | Discharge status: Home / Self Care | Payer: PPO | Attending: Internal Medicine | Admitting: Internal Medicine

## 2020-01-03 ENCOUNTER — Telehealth: Payer: Self-pay | Admitting: Pulmonary Disease

## 2020-01-03 DIAGNOSIS — R0602 Shortness of breath: Secondary | ICD-10-CM | POA: Diagnosis not present

## 2020-01-03 DIAGNOSIS — K219 Gastro-esophageal reflux disease without esophagitis: Secondary | ICD-10-CM | POA: Diagnosis not present

## 2020-01-03 DIAGNOSIS — I2781 Cor pulmonale (chronic): Secondary | ICD-10-CM | POA: Diagnosis not present

## 2020-01-03 DIAGNOSIS — R0600 Dyspnea, unspecified: Secondary | ICD-10-CM

## 2020-01-03 DIAGNOSIS — I272 Pulmonary hypertension, unspecified: Secondary | ICD-10-CM | POA: Diagnosis not present

## 2020-01-03 DIAGNOSIS — G894 Chronic pain syndrome: Secondary | ICD-10-CM | POA: Diagnosis not present

## 2020-01-03 DIAGNOSIS — I700 Atherosclerosis of aorta: Secondary | ICD-10-CM | POA: Diagnosis not present

## 2020-01-03 DIAGNOSIS — Z794 Long term (current) use of insulin: Secondary | ICD-10-CM | POA: Insufficient documentation

## 2020-01-03 DIAGNOSIS — Z7951 Long term (current) use of inhaled steroids: Secondary | ICD-10-CM | POA: Insufficient documentation

## 2020-01-03 DIAGNOSIS — E669 Obesity, unspecified: Secondary | ICD-10-CM | POA: Insufficient documentation

## 2020-01-03 DIAGNOSIS — E1151 Type 2 diabetes mellitus with diabetic peripheral angiopathy without gangrene: Secondary | ICD-10-CM | POA: Insufficient documentation

## 2020-01-03 DIAGNOSIS — E1149 Type 2 diabetes mellitus with other diabetic neurological complication: Secondary | ICD-10-CM | POA: Diagnosis present

## 2020-01-03 DIAGNOSIS — J9621 Acute and chronic respiratory failure with hypoxia: Secondary | ICD-10-CM | POA: Diagnosis not present

## 2020-01-03 DIAGNOSIS — I130 Hypertensive heart and chronic kidney disease with heart failure and stage 1 through stage 4 chronic kidney disease, or unspecified chronic kidney disease: Secondary | ICD-10-CM | POA: Insufficient documentation

## 2020-01-03 DIAGNOSIS — E785 Hyperlipidemia, unspecified: Secondary | ICD-10-CM | POA: Insufficient documentation

## 2020-01-03 DIAGNOSIS — J449 Chronic obstructive pulmonary disease, unspecified: Secondary | ICD-10-CM | POA: Diagnosis not present

## 2020-01-03 DIAGNOSIS — IMO0002 Reserved for concepts with insufficient information to code with codable children: Secondary | ICD-10-CM | POA: Diagnosis present

## 2020-01-03 DIAGNOSIS — E1122 Type 2 diabetes mellitus with diabetic chronic kidney disease: Secondary | ICD-10-CM | POA: Insufficient documentation

## 2020-01-03 DIAGNOSIS — F329 Major depressive disorder, single episode, unspecified: Secondary | ICD-10-CM | POA: Insufficient documentation

## 2020-01-03 DIAGNOSIS — Z87891 Personal history of nicotine dependence: Secondary | ICD-10-CM | POA: Insufficient documentation

## 2020-01-03 DIAGNOSIS — Z9981 Dependence on supplemental oxygen: Secondary | ICD-10-CM | POA: Diagnosis not present

## 2020-01-03 DIAGNOSIS — Z20822 Contact with and (suspected) exposure to covid-19: Secondary | ICD-10-CM | POA: Insufficient documentation

## 2020-01-03 DIAGNOSIS — I50810 Right heart failure, unspecified: Secondary | ICD-10-CM | POA: Diagnosis present

## 2020-01-03 DIAGNOSIS — Z79899 Other long term (current) drug therapy: Secondary | ICD-10-CM | POA: Diagnosis not present

## 2020-01-03 DIAGNOSIS — N184 Chronic kidney disease, stage 4 (severe): Secondary | ICD-10-CM | POA: Diagnosis present

## 2020-01-03 DIAGNOSIS — I5033 Acute on chronic diastolic (congestive) heart failure: Principal | ICD-10-CM | POA: Diagnosis present

## 2020-01-03 DIAGNOSIS — E1165 Type 2 diabetes mellitus with hyperglycemia: Secondary | ICD-10-CM | POA: Diagnosis not present

## 2020-01-03 DIAGNOSIS — I10 Essential (primary) hypertension: Secondary | ICD-10-CM | POA: Diagnosis present

## 2020-01-03 DIAGNOSIS — I5082 Biventricular heart failure: Secondary | ICD-10-CM | POA: Insufficient documentation

## 2020-01-03 DIAGNOSIS — R600 Localized edema: Secondary | ICD-10-CM | POA: Diagnosis not present

## 2020-01-03 DIAGNOSIS — E876 Hypokalemia: Secondary | ICD-10-CM | POA: Diagnosis not present

## 2020-01-03 DIAGNOSIS — E1142 Type 2 diabetes mellitus with diabetic polyneuropathy: Secondary | ICD-10-CM | POA: Diagnosis not present

## 2020-01-03 DIAGNOSIS — Z7982 Long term (current) use of aspirin: Secondary | ICD-10-CM | POA: Insufficient documentation

## 2020-01-03 DIAGNOSIS — I25119 Atherosclerotic heart disease of native coronary artery with unspecified angina pectoris: Secondary | ICD-10-CM | POA: Diagnosis present

## 2020-01-03 DIAGNOSIS — M549 Dorsalgia, unspecified: Secondary | ICD-10-CM | POA: Insufficient documentation

## 2020-01-03 DIAGNOSIS — R0902 Hypoxemia: Secondary | ICD-10-CM | POA: Diagnosis not present

## 2020-01-03 DIAGNOSIS — Z6829 Body mass index (BMI) 29.0-29.9, adult: Secondary | ICD-10-CM | POA: Insufficient documentation

## 2020-01-03 DIAGNOSIS — I739 Peripheral vascular disease, unspecified: Secondary | ICD-10-CM | POA: Diagnosis present

## 2020-01-03 DIAGNOSIS — I2510 Atherosclerotic heart disease of native coronary artery without angina pectoris: Secondary | ICD-10-CM | POA: Diagnosis present

## 2020-01-03 LAB — LIPID PANEL
Cholesterol: 143 mg/dL (ref 0–200)
HDL: 58 mg/dL
LDL Cholesterol: 73 mg/dL (ref 0–99)
Total CHOL/HDL Ratio: 2.5 ratio
Triglycerides: 59 mg/dL
VLDL: 12 mg/dL (ref 0–40)

## 2020-01-03 LAB — RESPIRATORY PANEL BY RT PCR (FLU A&B, COVID)
Influenza A by PCR: NEGATIVE
Influenza B by PCR: NEGATIVE
SARS Coronavirus 2 by RT PCR: NEGATIVE

## 2020-01-03 LAB — CBC
HCT: 49.5 % — ABNORMAL HIGH (ref 36.0–46.0)
Hemoglobin: 16 g/dL — ABNORMAL HIGH (ref 12.0–15.0)
MCH: 30.7 pg (ref 26.0–34.0)
MCHC: 32.3 g/dL (ref 30.0–36.0)
MCV: 94.8 fL (ref 80.0–100.0)
Platelets: 187 10*3/uL (ref 150–400)
RBC: 5.22 MIL/uL — ABNORMAL HIGH (ref 3.87–5.11)
RDW: 14.3 % (ref 11.5–15.5)
WBC: 7.1 10*3/uL (ref 4.0–10.5)
nRBC: 0 % (ref 0.0–0.2)

## 2020-01-03 LAB — BASIC METABOLIC PANEL
Anion gap: 12 (ref 5–15)
BUN: 26 mg/dL — ABNORMAL HIGH (ref 8–23)
CO2: 35 mmol/L — ABNORMAL HIGH (ref 22–32)
Calcium: 9.1 mg/dL (ref 8.9–10.3)
Chloride: 92 mmol/L — ABNORMAL LOW (ref 98–111)
Creatinine, Ser: 1.97 mg/dL — ABNORMAL HIGH (ref 0.44–1.00)
GFR calc Af Amer: 30 mL/min — ABNORMAL LOW (ref >60)
GFR calc non Af Amer: 26 mL/min — ABNORMAL LOW (ref >60)
Glucose, Bld: 141 mg/dL — ABNORMAL HIGH (ref 70–99)
Potassium: 3 mmol/L — ABNORMAL LOW (ref 3.5–5.1)
Sodium: 139 mmol/L (ref 135–145)

## 2020-01-03 LAB — MAGNESIUM: Magnesium: 1.9 mg/dL (ref 1.7–2.4)

## 2020-01-03 LAB — TSH: TSH: 1.613 u[IU]/mL (ref 0.350–4.500)

## 2020-01-03 LAB — PROTIME-INR
INR: 1.3 — ABNORMAL HIGH (ref 0.8–1.2)
Prothrombin Time: 16.2 seconds — ABNORMAL HIGH (ref 11.4–15.2)

## 2020-01-03 LAB — TROPONIN I (HIGH SENSITIVITY): Troponin I (High Sensitivity): 24 ng/L — ABNORMAL HIGH (ref <18)

## 2020-01-03 LAB — BRAIN NATRIURETIC PEPTIDE: B Natriuretic Peptide: 1855 pg/mL — ABNORMAL HIGH (ref 0.0–100.0)

## 2020-01-03 MED ORDER — ASPIRIN EC 81 MG PO TBEC
81.0000 mg | DELAYED_RELEASE_TABLET | Freq: Every day | ORAL | Status: DC
  Administered 2020-01-04: 81 mg via ORAL
  Filled 2020-01-03: qty 1

## 2020-01-03 MED ORDER — IPRATROPIUM-ALBUTEROL 0.5-2.5 (3) MG/3ML IN SOLN
3.0000 mL | Freq: Once | RESPIRATORY_TRACT | Status: AC
  Administered 2020-01-03: 3 mL via RESPIRATORY_TRACT
  Filled 2020-01-03: qty 3

## 2020-01-03 MED ORDER — ALBUTEROL SULFATE (2.5 MG/3ML) 0.083% IN NEBU: 2.5000 mg | INHALATION_SOLUTION | RESPIRATORY_TRACT | Status: DC | PRN

## 2020-01-03 MED ORDER — FERROUS SULFATE 325 (65 FE) MG PO TABS: 325.0000 mg | ORAL_TABLET | Freq: Every day | ORAL | Status: DC

## 2020-01-03 MED ORDER — METOPROLOL TARTRATE 25 MG PO TABS
12.5000 mg | ORAL_TABLET | Freq: Two times a day (BID) | ORAL | Status: DC
  Administered 2020-01-03 – 2020-01-04 (×2): 12.5 mg via ORAL
  Filled 2020-01-03 (×2): qty 1

## 2020-01-03 MED ORDER — VITAMIN D 25 MCG (1000 UNIT) PO TABS
1000.0000 [IU] | ORAL_TABLET | Freq: Every day | ORAL | Status: DC
  Filled 2020-01-03: qty 1

## 2020-01-03 MED ORDER — VITAMIN D 25 MCG (1000 UNIT) PO TABS
1000.0000 [IU] | ORAL_TABLET | Freq: Every day | ORAL | Status: DC
  Administered 2020-01-04: 1000 [IU] via ORAL
  Filled 2020-01-03: qty 1

## 2020-01-03 MED ORDER — ACETAMINOPHEN 325 MG PO TABS: 650.0000 mg | ORAL_TABLET | ORAL | Status: DC | PRN

## 2020-01-03 MED ORDER — ALBUTEROL SULFATE (2.5 MG/3ML) 0.083% IN NEBU: 2.5000 mg | INHALATION_SOLUTION | RESPIRATORY_TRACT | Status: DC

## 2020-01-03 MED ORDER — FUROSEMIDE 10 MG/ML IJ SOLN
40.0000 mg | Freq: Once | INTRAMUSCULAR | Status: AC
  Administered 2020-01-03: 40 mg via INTRAVENOUS
  Filled 2020-01-03: qty 4

## 2020-01-03 MED ORDER — POTASSIUM CHLORIDE CRYS ER 20 MEQ PO TBCR
40.0000 meq | EXTENDED_RELEASE_TABLET | Freq: Once | ORAL | Status: AC
  Administered 2020-01-03: 40 meq via ORAL
  Filled 2020-01-03: qty 2

## 2020-01-03 MED ORDER — SODIUM CHLORIDE 0.9 % IV SOLN: 250.0000 mL | INTRAVENOUS | Status: DC | PRN

## 2020-01-03 MED ORDER — ONDANSETRON HCL 4 MG/2ML IJ SOLN: 4.0000 mg | Freq: Four times a day (QID) | INTRAMUSCULAR | Status: DC | PRN

## 2020-01-03 MED ORDER — GABAPENTIN 100 MG PO CAPS
200.0000 mg | ORAL_CAPSULE | Freq: Every day | ORAL | Status: DC
  Administered 2020-01-04: 200 mg via ORAL
  Filled 2020-01-03: qty 2

## 2020-01-03 MED ORDER — ALPRAZOLAM 0.5 MG PO TABS: 0.2500 mg | ORAL_TABLET | Freq: Every day | ORAL | Status: DC | PRN

## 2020-01-03 MED ORDER — PRAVASTATIN SODIUM 40 MG PO TABS: 40.0000 mg | ORAL_TABLET | Freq: Every day | ORAL | Status: DC

## 2020-01-03 MED ORDER — INSULIN GLARGINE 100 UNIT/ML ~~LOC~~ SOLN
20.0000 [IU] | Freq: Every day | SUBCUTANEOUS | Status: DC
  Filled 2020-01-03: qty 0.2

## 2020-01-03 MED ORDER — BUDESONIDE 0.25 MG/2ML IN SUSP
2.0000 mL | Freq: Two times a day (BID) | RESPIRATORY_TRACT | Status: DC
  Administered 2020-01-04: 0.25 mg via RESPIRATORY_TRACT
  Filled 2020-01-03: qty 2

## 2020-01-03 MED ORDER — METHYLPREDNISOLONE SODIUM SUCC 125 MG IJ SOLR
125.0000 mg | Freq: Once | INTRAMUSCULAR | Status: AC
  Administered 2020-01-03: 125 mg via INTRAVENOUS
  Filled 2020-01-03: qty 2

## 2020-01-03 MED ORDER — SODIUM CHLORIDE 0.9% FLUSH: 3.0000 mL | INTRAVENOUS | Status: DC | PRN

## 2020-01-03 MED ORDER — TREPROSTINIL 0.6 MG/ML IN SOLN: 18.0000 ug | Freq: Four times a day (QID) | RESPIRATORY_TRACT | Status: DC

## 2020-01-03 MED ORDER — PANTOPRAZOLE SODIUM 40 MG PO TBEC
40.0000 mg | DELAYED_RELEASE_TABLET | Freq: Two times a day (BID) | ORAL | Status: DC
  Administered 2020-01-03 – 2020-01-04 (×2): 40 mg via ORAL
  Filled 2020-01-03 (×2): qty 1

## 2020-01-03 MED ORDER — IPRATROPIUM-ALBUTEROL 0.5-2.5 (3) MG/3ML IN SOLN
3.0000 mL | Freq: Three times a day (TID) | RESPIRATORY_TRACT | Status: DC
  Administered 2020-01-04 (×2): 3 mL via RESPIRATORY_TRACT
  Filled 2020-01-03 (×2): qty 3

## 2020-01-03 MED ORDER — FUROSEMIDE 10 MG/ML IJ SOLN
80.0000 mg | Freq: Once | INTRAMUSCULAR | Status: AC
  Administered 2020-01-03: 80 mg via INTRAVENOUS
  Filled 2020-01-03: qty 8

## 2020-01-03 MED ORDER — HEPARIN SODIUM (PORCINE) 5000 UNIT/ML IJ SOLN
5000.0000 [IU] | Freq: Three times a day (TID) | INTRAMUSCULAR | Status: DC
  Administered 2020-01-03 – 2020-01-04 (×3): 5000 [IU] via SUBCUTANEOUS
  Filled 2020-01-03 (×3): qty 1

## 2020-01-03 MED ORDER — POTASSIUM CHLORIDE CRYS ER 20 MEQ PO TBCR: 30.0000 meq | EXTENDED_RELEASE_TABLET | ORAL | Status: DC

## 2020-01-03 MED ORDER — SODIUM CHLORIDE 0.9% FLUSH
3.0000 mL | Freq: Two times a day (BID) | INTRAVENOUS | Status: DC
  Administered 2020-01-03 – 2020-01-04 (×2): 3 mL via INTRAVENOUS

## 2020-01-03 NOTE — ED Notes (Signed)
Date and time results received: 01/03/20 12:33 PM  (use smartphrase ".now" to insert current time)  Test: pCO2 Critical Value: 28  Name of Provider Notified: Dr. Jimmye Norman   Orders Received? Or Actions Taken?: no new orders at this time

## 2020-01-03 NOTE — ED Notes (Signed)
Pt transported to US

## 2020-01-03 NOTE — ED Triage Notes (Signed)
Pt in via POV, reports normally being on 4L nasal cannula, oxygen satuturation in the 70's yesterday, reports she bumped oxygen up to 5L, advised per PCP to be evaluated here.  Pt 83% on 5L nasal cannula upon arrival to ED.  Placed on 6L nasal cannula, oxygen maintaining >92%.  Denies any other complaints.  NAD noted at this time.

## 2020-01-03 NOTE — ED Provider Notes (Addendum)
Assumed care from Dr. Jimmye Norman at 3 PM. Briefly, the patient is a 64 y.o. female with PMHx of  has a past medical history of Allergy, Aortic atherosclerosis ([place]), Arthritis, CKD (chronic kidney disease), stage IV ([place]), COPD (chronic obstructive pulmonary disease) ([place]), Coronary artery calcification seen on CT scan, Depression, Diabetic neuropathy ([place]), Erythrocytosis (12/13/2019), Esophageal stricture, Familial hematuria, GERD (gastroesophageal reflux disease), Hyperlipidemia, Hypertension, IBS (irritable bowel syndrome), Nephrolithiasis, NIDDM (non-insulin dependent diabetes mellitus), Obesity, unspecified, Pulmonary hypertension ([place]), and PVD (peripheral vascular disease) ([place]). here with subjective SOB, increased hypoxia.   Labs Reviewed  CBC - Abnormal; Notable for the following components:      Result Value   RBC 5.22 (*)    Hemoglobin 16.0 (*)    HCT 49.5 (*)    All other components within normal limits  BLOOD GAS, VENOUS - Abnormal; Notable for the following components:   pCO2, Ven 73 (*)    Bicarbonate 47.4 (*)    Acid-Base Excess 18.7 (*)    All other components within normal limits  RESPIRATORY PANEL BY RT PCR (FLU A&B, COVID)  BASIC METABOLIC PANEL  PROTIME-INR  BLOOD GAS, VENOUS  BRAIN NATRIURETIC PEPTIDE  TROPONIN I (HIGH SENSITIVITY)  TROPONIN I (HIGH SENSITIVITY)    Course of Care: Reviewed labs and imaging.  Examined.  She has significant bilateral pitting edema with elevated BNP, chest x-ray concerning for effusions, and acute on chronic hypoxia.  Suspect acute heart failure exacerbation contributing to acute on chronic hypoxia.  Start on Lasix, admit.  Of note, he has fairly profound swelling on the left compared to the right, with erythema.  She has dopplerable pulses and good cap refill so I do not suspect acute arterial occlusion.  Differential includes asymmetric edema with stasis changes, possible early nonpurulent cellulitis, less likely vascular compromise.  Will  order ABIs.     Duffy Bruce, MD 01/03/20 Rudene Re    Duffy Bruce, MD 01/03/20 [phone]

## 2020-01-03 NOTE — H&P (Signed)
History and Physical    ROSHA COCKER FKC:[id number] DOB: December 25, 1955 DOA: 01/03/2020  PCP: Venia Carbon, MD She states she established with a Dr. Patsey Berthold here recently. Patient coming from: Home  I have personally briefly reviewed patient's old medical records in [place]  Chief Complaint:SOB  HPI: KYRIA BUMGARDNER is a 64 y.o. female with a pertinent history of pulmonary hypertension, HFpEF, h/o RVHF, CKD IV, chronic pain syndrome on methadone 4/day, COPD on 4L of oxygen at home who presents to the ED with SOB  64 HFpEF with underlying chronic hypoxia on 4L at baseline and came in because she has been having to increase to 5-6L and had to go down to mid 70's yesterday.  She wonders if the torsemide wasn't causing as much urination as prior.  Yesterday, her legs were felt to have more edema on them although she seems to be at her baseline weight of 158.  She denies increase of fluid intake and states she is fairly adherent with these.  Denies increase in Sodium intake. Has some orthopnea and increase in peripheral edema.  Denies OSA.  Has a history of angina but denies these current symptoms.  For pulmonary hypertension, she takes a QID inhaler prescribed by Dr. Lake Bells.   Presents with shortness of breath, weakness,   NA 139, K 3.0, Scr stable around 2 Increased LE Edema with L>R and BNP is 1800 up from previous of last which was 1691. Breathing treatments helped some.   Received IV Lasix in the ED, only IV 39m Red erythema on the Left > R.   Bilateral Venosu dopplers were negative.  EKG - shows actually improved ST depressions from January.  NSR, with RAE. CXR - "Mild bibasilar subsegmental atelectasis with minimal pleural effusions. No other significant abnormality seen in the chest."   Review of Systems: As per HPI otherwise 10 point review of systems negative.  Other pertinents as below:  General - denies any fevers or chils, recent illness HEENT - denies any sore  throat, runny nose Cardio - denies any chest pain, palpitations Resp - denies hemoptysis, denies a history of blood clots GI - denies n/v/d/GI pain, hematochezia or melena GU - she does wonder if she is having symptoms of urinary retention. MSK - denies any new joint or back pain, takes opioids qid for her chronic pain syndrome Skin - redness of legs and some foot skin ulcers, no signs of infection though Neuro - denies any new numbness or weakness Psych - denies SI/HI , depressiopn or anxiety.   Past Medical History:  Diagnosis Date  . Allergy   . Aortic atherosclerosis ([place]   . Arthritis   . CKD (chronic kidney disease), stage IV ([place]   . COPD (chronic obstructive pulmonary disease) ([place]    a. 11/2014 PFT's: FEV1 0.87 (38%), FVC 1.41 (48%), FEF 25-75 0.41 (19%). Unable to perform DLCO; b. 12/2017 Simple spirometry ration 67%. FEV1 1.21 L+53% predicted. FVC 1.79L 61% predicted.  . Coronary artery calcification seen on CT scan    a. 03/2017 CT Chest.  . Depression   . Diabetic neuropathy ([place]   . Erythrocytosis 12/13/2019  . Esophageal stricture   . Familial hematuria   . GERD (gastroesophageal reflux disease)   . Hyperlipidemia   . Hypertension   . IBS (irritable bowel syndrome)   . Nephrolithiasis   . NIDDM (non-insulin dependent diabetes mellitus)   . Obesity, unspecified   . Pulmonary hypertension ([place]    a.  WHO Group 3; b. 10/2016 Echo: EF 55-60%, no rwma, Gr1 DD, mild to mod AS, mean grad 27mHg, mildly dil RV w/ mildly reduced RV fxn, mildly dil RA. PASP 1117mg; c. 12/2017 RHC: RA 11, RV 92/12, PA 92/33(57), PCWP 15, Fick CO/CI 5.9/3.3. PVR 7.2 WU. Ao 93%, PA 62/64%.  . Marland KitchenVD (peripheral vascular disease) ([place]    Past Surgical History:  Procedure Laterality Date  . ABDOMINAL HYSTERECTOMY    . CARDIAC CATHETERIZATION    . CARPAL TUNNEL RELEASE    . CESAREAN SECTION    . ERCP N/A 03/17/2017   Procedure: ENDOSCOPIC RETROGRADE CHOLANGIOPANCREATOGRAPHY (ERCP);  Surgeon:  WoLucilla LameMD;  [facility];  Service: Endoscopy;  Laterality: N/A;  . EUS N/A 03/13/2017   Procedure: FULL UPPER ENDOSCOPIC ULTRASOUND (EUS) RADIAL;  Surgeon: Burbridge, ReMurray HodgkinsMD;  Location: [facility];  Service: Endoscopy;  Laterality: N/A;  . RIGHT HEART CATH N/A 01/22/2018   Procedure: RIGHT HEART CATH;  Surgeon: BeJolaine ArtistMD;  [facility];  Service: Cardiovascular;  Laterality: N/A;  . RIGHT HEART CATHETERIZATION N/A 12/05/2014   Procedure: RIGHT HEART CATH;  Surgeon: HeSinclair GroomsMD;  [facility];  Service: Cardiovascular;  Laterality: N/A;     reports that she quit smoking about 14 years ago. Her smoking use included cigarettes. She has a 49.50 pack-year smoking history. She has never used smokeless tobacco. She reports that she does not drink alcohol or use drugs.  Allergies  Allergen Reactions  . Sertraline Hcl Other (See Comments)    Altered Mental Status    Family History  Problem Relation Age of Onset  . Diabetes Mother   . Cancer Mother        ovarian,melanoma  . Allergies Father   . Cancer Father        lung  . Cancer Maternal Grandmother        uterine  . Emphysema Paternal Grandfather   . Allergies Sister   . Breast cancer Neg Hx     Prior to Admission medications   Medication Sig Start Date End Date Taking? Authorizing Provider  albuterol (PROVENTIL) (2.5 MG/3ML) 0.083% nebulizer solution USE 1 VIAL (3ML) BY NEBULIZATION EVERY 6HOURS AS NEEDED FOR WHEEZING OR SHORTNESS OF BREATH 11/10/19  Yes WaMartyn EhrichNP  albuterol (VENTOLIN HFA) 108 (90 Base) MCG/ACT inhaler USE 2 PUFFS EVERY SIX HOURS AS NEEDED FOR WHEEZING OR SHORTNESS OF BREATH 10/19/18  Yes McJuanito DoomMD  ALPRAZolam (XDuanne Moron0.25 MG tablet Take 1 tablet (0.25 mg total) by mouth daily as needed for anxiety. 07/24/18  Yes LeVenia CarbonMD  aspirin EC 81 MG tablet Take 1 tablet (81 mg total) by mouth daily. 03/23/19  Yes  ArWellington HampshireMD  calcitRIOL (ROCALTROL) 0.5 MCG capsule Take 0.5 mcg by mouth daily.  02/03/17  Yes [provider]  cholecalciferol (VITAMIN D) 1000 units tablet Take 1,000 Units by mouth at bedtime.    Yes [provider]  ferrous sulfate 325 (65 FE) MG tablet Take 325 mg by mouth daily with breakfast.   Yes [provider]  gabapentin (NEURONTIN) 300 MG capsule Take 1 capsule in the am and 2 at bedtime. 03/18/19  Yes LeViviana Simpler, MD  glucose blood (ONE TOUCH ULTRA TEST) test strip USE 1 STRIP TO TEST BLOOD SUGAR ONCE DAILY Dx Code E11.51 04/19/19  Yes LeVenia CarbonMD  Insulin Pen Needle (SURE COMFORT PEN  NEEDLES) 31G X 5 MM MISC USE 1 PEN NEEDLE TO INJECT INSULIN 10/23/18  Yes Venia Carbon, MD  LANTUS SOLOSTAR 100 UNIT/ML Solostar Pen INJECT 20 UNITS INTO THE SKIN AT BEDTIME Patient taking differently: Inject 20 Units into the skin daily.  11/29/19  Yes Venia Carbon, MD  loratadine (CLARITIN) 10 MG tablet Take 10 mg by mouth 2 (two) times daily.   Yes Tisovec, Fransico Him, MD  lovastatin (MEVACOR) 40 MG tablet Take 2 tablets (80 mg total) by mouth at bedtime. 09/16/19  Yes Venia Carbon, MD  methadone (DOLOPHINE) 10 MG tablet Take 2 tablets (20 mg total) by mouth every 6 (six) hours as needed. Patient taking differently: Take 20 mg by mouth in the morning, at noon, in the evening, and at bedtime.  12/03/19  Yes Venia Carbon, MD  metoprolol tartrate (LOPRESSOR) 25 MG tablet Take 0.5 tablets (12.5 mg total) by mouth 2 (two) times daily. 04/26/19  Yes Venia Carbon, MD  mometasone (ASMANEX, 120 METERED DOSES,) 220 MCG/INH inhaler Inhale 2 puffs into the lungs 2 (two) times daily. 12/23/19  Yes Martyn Ehrich, NP  nitroGLYCERIN (NITROSTAT) 0.4 MG SL tablet Place 1 tablet (0.4 mg total) under the tongue every 5 (five) minutes as needed for chest pain. 07/06/19  Yes Venia Carbon, MD  pantoprazole (PROTONIX) 40 MG tablet Take 1 tablet (40 mg  total) by mouth 2 (two) times daily. 09/16/19  Yes Venia Carbon, MD  Respiratory Therapy Supplies (FLUTTER) DEVI Use as directed 12/10/19  Yes Tyler Pita, MD  STIOLTO RESPIMAT 2.5-2.5 MCG/ACT AERS INHALE 2 PUFFS BY MOUTH INTO THE LUNGS DAILY. 09/13/19  Yes Martyn Ehrich, NP  torsemide (DEMADEX) 20 MG tablet TAKE 2 TABLETS BY MOUTH EVERY DAY 12/06/19  Yes Bensimhon, Shaune Pascal, MD  Treprostinil (TYVASO) 0.6 MG/ML SOLN Inhale 18 mcg into the lungs 4 (four) times daily.   Yes [provider]    Physical Exam: Vitals:   01/03/20 1830 01/03/20 1924 01/03/20 1930 01/03/20 2000  BP: 115/63 119/76 116/66 114/68  Pulse: 99 94 93 92  Resp: _0 Temp:      TempSrc:      SpO2: 97% 96% 99% 98%  Weight:      Height:        Constitutional: NAD, comfortable, oxygen on the face Eyes: pupils equal and reactive to light, anicteric, without injection ENMT: MMM, throat without exudates or erythema Neck: normal, supple, no masses, no thyromegaly noted Respiratory: CTAB, nwob, bibasilar rales, JVD to mid neck Cardiovascular: rrr w/o mrg, warm extremities Abdomen: NBS, NT,   Musculoskeletal: moving all 4 extremities, strength grossly intact 5/5 in the UE and LE's,  Skin: L>R venous stasis dermatitis without absolute entry point of infection.  Does have calluses on feet and signs of PVD but no signs of infection at this time. Neurologic: CN 2-12 grossly intact. Sensation intact Psychiatric: AO appearing, mentation appropriate   Labs on Admission: I have personally reviewed following labs and imaging studies  CBC: Recent Labs  Lab 01/03/20 1209  WBC 7.1  HGB 16.0*  HCT 49.5*  MCV 94.8  PLT 354   Basic Metabolic Panel: Recent Labs  Lab 01/03/20 1628  NA 139  K 3.0*  CL 92*  CO2 35*  GLUCOSE 141*  BUN 26*  CREATININE 1.97*  CALCIUM 9.1   GFR: Estimated Creatinine Clearance: 25.5 mL/min (A) (by C-G formula based on SCr of 1.97 mg/dL (  H)). Liver Function  Tests: No results for input(s): AST, ALT, ALKPHOS, BILITOT, PROT, ALBUMIN in the last 168 hours. No results for input(s): LIPASE, AMYLASE in the last 168 hours. No results for input(s): AMMONIA in the last 168 hours. Coagulation Profile: Recent Labs  Lab 01/03/20 1628  INR 1.3*   Cardiac Enzymes: No results for input(s): CKTOTAL, CKMB, CKMBINDEX, TROPONINI in the last 168 hours. BNP (last 3 results) Recent Labs    06/17/19 1154  PROBNP 962.0*   HbA1C: No results for input(s): HGBA1C in the last 72 hours. CBG: No results for input(s): GLUCAP in the last 168 hours. Lipid Profile: No results for input(s): CHOL, HDL, LDLCALC, TRIG, CHOLHDL, LDLDIRECT in the last 72 hours. Thyroid Function Tests: No results for input(s): TSH, T4TOTAL, FREET4, T3FREE, THYROIDAB in the last 72 hours. Anemia Panel: No results for input(s): VITAMINB12, FOLATE, FERRITIN, TIBC, IRON, RETICCTPCT in the last 72 hours. Urine analysis:    Component Value Date/Time   COLORURINE YELLOW 03/21/2017 1120   APPEARANCEUR CLOUDY (A) 03/21/2017 1120   LABSPEC <=1.005 (A) 03/21/2017 1120   PHURINE 6.0 03/21/2017 1120   GLUCOSEU 100 (A) 03/21/2017 1120   HGBUR SMALL (A) 03/21/2017 1120   BILIRUBINUR NEGATIVE 03/21/2017 1120   KETONESUR NEGATIVE 03/21/2017 1120   PROTEINUR NEGATIVE 01/27/2017 2250   UROBILINOGEN 0.2 03/21/2017 1120   NITRITE POSITIVE (A) 03/21/2017 1120   LEUKOCYTESUR LARGE (A) 03/21/2017 1120    Radiological Exams on Admission: DG Chest 2 View  Result Date: 01/03/2020 CLINICAL DATA:  Shortness of breath. EXAM: CHEST - 2 VIEW COMPARISON:  November 02, 2018. FINDINGS: The heart size and mediastinal contours are within normal limits. No pneumothorax is noted. Mild bibasilar subsegmental atelectasis is noted with minimal pleural effusions. The visualized skeletal structures are unremarkable. IMPRESSION: Mild bibasilar subsegmental atelectasis with minimal pleural effusions. No other significant  abnormality seen in the chest. Electronically Signed   By: Marijo Conception M.D.   On: 01/03/2020 12:51   US Venous Img Lower Bilateral (DVT)  Result Date: 01/03/2020 CLINICAL DATA:  Lower extremity edema.  Recent hypoxia EXAM: BILATERAL LOWER EXTREMITY VENOUS DUPLEX ULTRASOUND TECHNIQUE: Gray-scale sonography with graded compression, as well as color Doppler and duplex ultrasound were performed to evaluate the lower extremity deep venous systems from the level of the common femoral vein and including the common femoral, femoral, profunda femoral, popliteal and calf veins including the posterior tibial, peroneal and gastrocnemius veins when visible. The superficial great saphenous vein was also interrogated. Spectral Doppler was utilized to evaluate flow at rest and with distal augmentation maneuvers in the common femoral, femoral and popliteal veins. COMPARISON:  Left lower extremity venous duplex ultrasound December 17, 2019 FINDINGS: RIGHT LOWER EXTREMITY Common Femoral Vein: No evidence of thrombus. Normal compressibility, respiratory phasicity and response to augmentation. Saphenofemoral Junction: No evidence of thrombus. Normal compressibility and flow on color Doppler imaging. Profunda Femoral Vein: No evidence of thrombus. Normal compressibility and flow on color Doppler imaging. Femoral Vein: No evidence of thrombus. Normal compressibility, respiratory phasicity and response to augmentation. Popliteal Vein: No evidence of thrombus. Normal compressibility, respiratory phasicity and response to augmentation. Calf Veins: No evidence of thrombus. Normal compressibility and flow on color Doppler imaging. Superficial Great Saphenous Vein: No evidence of thrombus. Normal compressibility. Venous Reflux:  None. Other Findings:  None. LEFT LOWER EXTREMITY Common Femoral Vein: No evidence of thrombus. Normal compressibility, respiratory phasicity and response to augmentation. Saphenofemoral Junction: No evidence of  thrombus. Normal compressibility and flow on  color Doppler imaging. Profunda Femoral Vein: No evidence of thrombus. Normal compressibility and flow on color Doppler imaging. Femoral Vein: No evidence of thrombus. Normal compressibility, respiratory phasicity and response to augmentation. Popliteal Vein: No evidence of thrombus. Normal compressibility, respiratory phasicity and response to augmentation. Calf Veins: No evidence of thrombus. Normal compressibility and flow on color Doppler imaging. Superficial Great Saphenous Vein: No evidence of thrombus. Normal compressibility. Venous Reflux:  None. Other Findings:  None. IMPRESSION: No evidence of deep venous thrombosis in either lower extremity. Electronically Signed   By: Lowella Grip III M.D.   On: 01/03/2020 15:58    EKG: Independently reviewed. Subtle twave flattening in v2-v4 and mild st depression in v3, fairly nonspecific and similar to prior.  Actually improved ST depressions from January.  Assessment/Plan Active Problems:   Type 2 diabetes mellitus with neurological manifestations, controlled ([place])   Essential hypertension, benign   Atherosclerotic heart disease of native coronary artery with angina pectoris (HCC)   PVD (peripheral vascular disease) (HCC)   Diabetes, polyneuropathy (HCC)   DM (diabetes mellitus), type 2, uncontrolled, periph vascular complic (HCC)   Chronic renal disease, stage IV (HCC)   COPD (chronic obstructive pulmonary disease) (HCC)   RVF (right ventricular failure) (HCC)   Cor pulmonale, chronic (HCC)   Pulmonary hypertension ([place])  64 y/o female with multiple comorbidities and on chronic oxygen comes with sob and desaatting felt to be probably most likely an exacerbation of her HF, I suspect diastolic.  Will need to optimize her h/o pulmonary hypertension and wean oxygen and try to suss off some fluid to look for improvement.  Acute on Chronic HFpEF exacerbation and suspect a RVHF exacerbation in the  setting of pulmonary hypertension. Chronic hypercarbic respiratory failure, continue to monitor bicarb Pulmonary Hypertension - asked her to have family bring in her inhaler to use here in the hospital COPD will continue inhaler equivalents IV lasix 48m in the ED and will give IV 834m strict I/O's, fluid rstrictions and sodium restrictions. --troponins at 9:30 pm, serially --monitor electrolytes closely --K >4, Mg >2 --repeat TTE, has some cold R feet, but fairly warm on the left and think this is probably because of PVD. Don't think cold and wet but CTM --continue telemetry ----Oxygen Therapy --repeat TTE --Troponins --Follows with Dr. BeJeffie Pollockn the outpatient  Hypokalemia, replace aggressively  S/p 701min the ED and will await til reading tomorrow morning for further repletion Consider BMP's BID Mg as above  Cyanotic legs and increase in peripheral edema in setting of presumed venous stasis dermatitis doubt cellulitis because it is very unlikely to have bilateral cellulitis and think that with increase in peripheral edema this is likely the component One of her main concerns is that her foot turned blue and she had it raised up and the color returned to it.  I do suspect coincides with her PVD. --compression stockings --monitor vitals --will hold on antibiotics  Urinary retention?  Continue to monitor urine output, consider post void residuals. Scr is stable from prior but monitor  Chronic Conditions CKD IV, close to baseline - renally dose medications, avoid NSAIDs DM - SSI, blood sugar checks.  Lantus 20 units, and thinks she has good control HTN  PVD - +++Foot ulcers and calluses, consider assessment in the hospital.  But NEED assessment in the outpatient Chronic pain - cont' methadone +++sometimes pharmacy needs to confirm the dosage+++  Patient and/or Family completely agreed with the plan, expressed understanding and I answered all  questions.  DVT prophylaxis:  Heparin SQ and SCDs Code Status: Full code  She would want her [place], [place], [phone] Family Communication: n/a, patient has decision making capacity Disposition Plan: Home when improved clinically. (specify when and where you expect patient to be discharged) Consults called: none at this time Admission status: inpatient because she has acute on chronic hypoxic respiratory failure and is a suspected HF exacerbation.  She needs telemetry.  I don't suspect it will take long unless TTE shows a significant change.   A total of 80 minutes utilized during this admission.  [place] [facility]   If 7PM-7AM, please contact night-coverage [email or web address] Password [facility]  01/03/2020, 8:43 PM

## 2020-01-03 NOTE — Telephone Encounter (Signed)
Called and spoke to pt, who states over the weekend her oxygen level dropped to 77% on 4L and HR of 36. Pt stated that she increased oxygen flow to 5L and spo2 increased to 94% and HR 72. Pt reports of right foot being blue when oxygen level was low. Pt stated that discoloration of foot has improved.  She also reports of swelling and soreness in left calf.  She denied increased sob. Sob is baseline.  I have spoken to Dr. Patsey Berthold verbally- recommend ED for eval of DVT and PE.  Pt is aware and voiced her understanding.  Nothing further is needed.

## 2020-01-03 NOTE — ED Provider Notes (Signed)
[facility] Emergency Department Provider Note       Time seen: ----------------------------------------- 12:13 PM on 01/03/2020 -----------------------------------------   I have reviewed the triage vital signs and the nursing notes.  HISTORY   Chief Complaint Hypoxia    HPI Lindsay Harmon is a 64 y.o. female with a history of arthritis, CKD, COPD, depression, GERD, hyperlipidemia, hypertension, IBS, pulmonary hypertension 4 L nasal cannula oxygen chronically who presents to the ED for hypoxia.  Oxygen saturations in the 70s yesterday.  Reportedly she increased her oxygen to 5 L.  She does report dyspnea on exertion but otherwise does not feel more short of breath than normal.  She reports a second Covid vaccination 5 days ago.  Denies fevers, chills, vomiting or diarrhea.  Past Medical History:  Diagnosis Date  . Allergy   . Aortic atherosclerosis ([place])   . Arthritis   . CKD (chronic kidney disease), stage IV ([place])   . COPD (chronic obstructive pulmonary disease) ([place])    a. 11/2014 PFT's: FEV1 0.87 (38%), FVC 1.41 (48%), FEF 25-75 0.41 (19%). Unable to perform DLCO; b. 12/2017 Simple spirometry ration 67%. FEV1 1.21 L+53% predicted. FVC 1.79L 61% predicted.  . Coronary artery calcification seen on CT scan    a. 03/2017 CT Chest.  . Depression   . Diabetic neuropathy ([place])   . Erythrocytosis 12/13/2019  . Esophageal stricture   . Familial hematuria   . GERD (gastroesophageal reflux disease)   . Hyperlipidemia   . Hypertension   . IBS (irritable bowel syndrome)   . Nephrolithiasis   . NIDDM (non-insulin dependent diabetes mellitus)   . Obesity, unspecified   . Pulmonary hypertension ([place])    a. WHO Group 3; b. 10/2016 Echo: EF 55-60%, no rwma, Gr1 DD, mild to mod AS, mean grad 62mHg, mildly dil RV w/ mildly reduced RV fxn, mildly dil RA. PASP 1130mg; c. 12/2017 RHC: RA 11, RV 92/12, PA 92/33(57), PCWP 15, Fick CO/CI 5.9/3.3. PVR 7.2 WU. Ao 93%, PA  62/64%.  . Marland KitchenVD (peripheral vascular disease) ([facility]    Patient Active Problem List   Diagnosis Date Noted  . Erythrocytosis 12/13/2019  . Senile purpura (HCJeisyville11/30/2020  . Foot pain, bilateral 03/03/2019  . Acute on chronic right heart failure (HCDryden  . Aortic atherosclerosis (HCBath  . Narcotic dependence (HCJerome09/26/2018  . Encounter for chronic pain management 03/21/2017  . Cor pulmonale, chronic (HCRoachdale  . Pulmonary hypertension (HCFallbrook  . RVF (right ventricular failure) (HCGilberts03/22/2016  . Chronic respiratory failure with hypoxia (HCCountry Club Estates02/18/2016  . COPD (chronic obstructive pulmonary disease) (HCDe Soto02/18/2016  . Chronic diastolic heart failure (HCAlgonquin02/05/2015  . Chronic renal disease, stage IV (HCGramling02/01/2015  . Advance directive discussed with patient 11/08/2014  . DM (diabetes mellitus), type 2, uncontrolled, periph vascular complic (HCPatch [id number]. Diabetes, polyneuropathy (HCNorth Utica11/19/2014  . Routine general medical examination at a health care facility 08/13/2011  . Atherosclerotic heart disease of native coronary artery with angina pectoris (HCMenominee05/11/2006  . Chronic back pain 02/27/2007  . Type 2 diabetes mellitus with neurological manifestations, controlled (HCLopatcong Over[place]05/10/2006  . Episodic mood disorder (HCMcPherson05/10/2006  . Essential hypertension, benign 02/26/2007  . PVD (peripheral vascular disease) (HCSandyville05/10/2006  . GERD 02/26/2007  . DEGENERATIVE JOINT DISEASE 02/26/2007    Past Surgical History:  Procedure Laterality Date  . ABDOMINAL HYSTERECTOMY    . CARDIAC CATHETERIZATION    . CARPAL TUNNEL RELEASE    .  CESAREAN SECTION    . ERCP N/A 03/17/2017   Procedure: ENDOSCOPIC RETROGRADE CHOLANGIOPANCREATOGRAPHY (ERCP);  Surgeon: [name], MD;  Location: [facility] ENDOSCOPY;  Service: Endoscopy;  Laterality: N/A;  . EUS N/A 03/13/2017   Procedure: FULL UPPER ENDOSCOPIC ULTRASOUND (EUS) RADIAL;  Surgeon: Burbridge, Murray Hodgkins, MD;  Location: [facility] ENDOSCOPY;   Service: Endoscopy;  Laterality: N/A;  . RIGHT HEART CATH N/A 01/22/2018   Procedure: RIGHT HEART CATH;  Surgeon: Jolaine Artist, MD;  Location: [place] CV LAB;  Service: Cardiovascular;  Laterality: N/A;  . RIGHT HEART CATHETERIZATION N/A 12/05/2014   Procedure: RIGHT HEART CATH;  Surgeon: Sinclair Grooms, MD;  Location: [facility] CATH LAB;  Service: Cardiovascular;  Laterality: N/A;    Allergies Sertraline hcl  Social History Social History   Tobacco Use  . Smoking status: Former Smoker    Packs/day: 1.50    Years: 33.00    Pack years: 49.50    Types: Cigarettes    Quit date: 07/28/2005    Years since quitting: 14.4  . Smokeless tobacco: Never Used  Substance Use Topics  . Alcohol use: No    Alcohol/week: 0.0 standard drinks    Comment: heavy in the past  . Drug use: No    Review of Systems Constitutional: Negative for fever. Cardiovascular: Negative for chest pain. Respiratory: Positive for shortness of breath Gastrointestinal: Negative for abdominal pain, vomiting and diarrhea. Musculoskeletal: Negative for back pain. Skin: Negative for rash. Neurological: Negative for headaches, focal weakness or numbness.  All systems negative/normal/unremarkable except as stated in the HPI  ____________________________________________   PHYSICAL EXAM:  VITAL SIGNS: ED Triage Vitals  Enc Vitals Group     BP 01/03/20 1140 (!) 146/71     Pulse Rate 01/03/20 1140 79     Resp 01/03/20 1140 20     Temp 01/03/20 1140 98.7 F (37.1 C)     Temp Source 01/03/20 1140 Oral     SpO2 01/03/20 1140 (!) 85 %     Weight 01/03/20 1140 158 lb (71.7 kg)     Height 01/03/20 1140 _0  (1.549 m)     Head Circumference --      Peak Flow --      Pain Score 01/03/20 1158 0     Pain Loc --      Pain Edu? --      Excl. in [place]? --     Constitutional: Alert and oriented. Well appearing and in no distress. Eyes: Conjunctivae are normal. Normal extraocular movements. Cardiovascular: Normal  rate, regular rhythm. No murmurs, rubs, or gallops. Respiratory: Normal respiratory effort without tachypnea nor retractions. Breath sounds are clear and equal bilaterally. No wheezes/rales/rhonchi. Gastrointestinal: Soft and nontender. Normal bowel sounds Musculoskeletal: Nontender with normal range of motion in extremities. No lower extremity tenderness nor edema. Neurologic:  Normal speech and language. No gross focal neurologic deficits are appreciated.  Skin:  Skin is warm, dry and intact. No rash noted. Psychiatric: Mood and affect are normal. Speech and behavior are normal.  ____________________________________________  EKG: Interpreted by me.  Sinus rhythm rate of 75 bpm, right axis deviation, normal QT  ____________________________________________  ED COURSE:  As part of my medical decision making, I reviewed the following data within the [place] History obtained from family if available, nursing notes, old chart and ekg, as well as notes from prior ED visits. Patient presented for dyspnea and hypoxia, we will assess with labs and imaging as indicated at this  time.   Procedures  KELECHI ASTARITA was evaluated in Emergency Department on 01/03/2020 for the symptoms described in the history of present illness. She was evaluated in the context of the global COVID-19 pandemic, which necessitated consideration that the patient might be at risk for infection with the SARS-CoV-2 virus that causes COVID-19. Institutional protocols and algorithms that pertain to the evaluation of patients at risk for COVID-19 are in a state of rapid change based on information released by regulatory bodies including the CDC and federal and state organizations. These policies and algorithms were followed during the patient's care in the ED.  ____________________________________________   LABS (pertinent positives/negatives)  Labs Reviewed  CBC - Abnormal; Notable for the following components:       Result Value   RBC 5.22 (*)    Hemoglobin 16.0 (*)    HCT 49.5 (*)    All other components within normal limits  BLOOD GAS, VENOUS - Abnormal; Notable for the following components:   pCO2, Ven 73 (*)    Bicarbonate 47.4 (*)    Acid-Base Excess 18.7 (*)    All other components within normal limits  RESPIRATORY PANEL BY RT PCR (FLU A&B, COVID)  BASIC METABOLIC PANEL  PROTIME-INR  BLOOD GAS, VENOUS  TROPONIN I (HIGH SENSITIVITY)  TROPONIN I (HIGH SENSITIVITY)    RADIOLOGY Images were viewed by me  Chest x-ray  IMPRESSION:  Mild bibasilar subsegmental atelectasis with minimal pleural  effusions. No other significant abnormality seen in the chest.  ____________________________________________   DIFFERENTIAL DIAGNOSIS   CHF, COPD, pneumonia, COVID-19, PE, vaccination reaction  FINAL ASSESSMENT AND PLAN  Dyspnea, hypoxia   Plan: The patient had presented for dyspnea reported hypoxia. Patient's labs revealed significant hypercarbia for which she was given nebs and steroids.  Repeat laboratory tests are pending.  Patient's imaging has not revealed any acute process.  Final disposition is pending at this time.  Patient is asymptomatic currently.   Laurence Aly, MD    Note: This note was generated in part or whole with voice recognition software. Voice recognition is usually quite accurate but there are transcription errors that can and very often do occur. I apologize for any typographical errors that were not detected and corrected.     Earleen Newport, MD 01/03/20 1451

## 2020-01-03 NOTE — ED Notes (Signed)
Pt transported to x-ray.

## 2020-01-03 NOTE — ED Notes (Signed)
Admitting provider at bedside.

## 2020-01-04 ENCOUNTER — Observation Stay (HOSPITAL_BASED_OUTPATIENT_CLINIC_OR_DEPARTMENT_OTHER)
Admit: 2020-01-04 | Discharge: 2020-01-04 | Disposition: A | Discharge status: Home / Self Care | Payer: PPO | Attending: Internal Medicine | Admitting: Internal Medicine

## 2020-01-04 ENCOUNTER — Observation Stay: Payer: PPO

## 2020-01-04 DIAGNOSIS — E1165 Type 2 diabetes mellitus with hyperglycemia: Secondary | ICD-10-CM | POA: Diagnosis not present

## 2020-01-04 DIAGNOSIS — I70213 Atherosclerosis of native arteries of extremities with intermittent claudication, bilateral legs: Secondary | ICD-10-CM | POA: Diagnosis not present

## 2020-01-04 DIAGNOSIS — I5033 Acute on chronic diastolic (congestive) heart failure: Secondary | ICD-10-CM | POA: Diagnosis not present

## 2020-01-04 DIAGNOSIS — E1151 Type 2 diabetes mellitus with diabetic peripheral angiopathy without gangrene: Secondary | ICD-10-CM

## 2020-01-04 DIAGNOSIS — I739 Peripheral vascular disease, unspecified: Secondary | ICD-10-CM | POA: Diagnosis not present

## 2020-01-04 DIAGNOSIS — I272 Pulmonary hypertension, unspecified: Secondary | ICD-10-CM | POA: Diagnosis not present

## 2020-01-04 DIAGNOSIS — I2781 Cor pulmonale (chronic): Secondary | ICD-10-CM

## 2020-01-04 LAB — GLUCOSE, CAPILLARY
Glucose-Capillary: 110 mg/dL — ABNORMAL HIGH (ref 70–99)
Glucose-Capillary: 177 mg/dL — ABNORMAL HIGH (ref 70–99)
Glucose-Capillary: 318 mg/dL — ABNORMAL HIGH (ref 70–99)

## 2020-01-04 LAB — CBC
HCT: 49.6 % — ABNORMAL HIGH (ref 36.0–46.0)
Hemoglobin: 15.9 g/dL — ABNORMAL HIGH (ref 12.0–15.0)
MCH: 30.1 pg (ref 26.0–34.0)
MCHC: 32.1 g/dL (ref 30.0–36.0)
MCV: 93.9 fL (ref 80.0–100.0)
Platelets: 183 10*3/uL (ref 150–400)
RBC: 5.28 MIL/uL — ABNORMAL HIGH (ref 3.87–5.11)
RDW: 14.2 % (ref 11.5–15.5)
WBC: 9.7 10*3/uL (ref 4.0–10.5)
nRBC: 0 % (ref 0.0–0.2)

## 2020-01-04 LAB — BASIC METABOLIC PANEL
Anion gap: 14 (ref 5–15)
BUN: 27 mg/dL — ABNORMAL HIGH (ref 8–23)
CO2: 34 mmol/L — ABNORMAL HIGH (ref 22–32)
Calcium: 9.7 mg/dL (ref 8.9–10.3)
Chloride: 93 mmol/L — ABNORMAL LOW (ref 98–111)
Creatinine, Ser: 1.86 mg/dL — ABNORMAL HIGH (ref 0.44–1.00)
GFR calc Af Amer: 32 mL/min — ABNORMAL LOW (ref >60)
GFR calc non Af Amer: 28 mL/min — ABNORMAL LOW (ref >60)
Glucose, Bld: 175 mg/dL — ABNORMAL HIGH (ref 70–99)
Potassium: 3.8 mmol/L (ref 3.5–5.1)
Sodium: 141 mmol/L (ref 135–145)

## 2020-01-04 LAB — ECHOCARDIOGRAM COMPLETE
Height: 61 in
Weight: 2489.6 oz

## 2020-01-04 LAB — HEMOGLOBIN A1C
Hgb A1c MFr Bld: 8.3 % — ABNORMAL HIGH (ref 4.8–5.6)
Mean Plasma Glucose: 191.51 mg/dL

## 2020-01-04 LAB — TROPONIN I (HIGH SENSITIVITY): Troponin I (High Sensitivity): 31 ng/L — ABNORMAL HIGH (ref <18)

## 2020-01-04 LAB — MAGNESIUM: Magnesium: 1.9 mg/dL (ref 1.7–2.4)

## 2020-01-04 MED ORDER — INSULIN GLARGINE 100 UNIT/ML ~~LOC~~ SOLN
20.0000 [IU] | Freq: Every day | SUBCUTANEOUS | Status: DC
  Administered 2020-01-04: 20 [IU] via SUBCUTANEOUS
  Filled 2020-01-04 (×2): qty 0.2

## 2020-01-04 MED ORDER — INSULIN ASPART 100 UNIT/ML ~~LOC~~ SOLN
4.0000 [IU] | Freq: Three times a day (TID) | SUBCUTANEOUS | Status: DC
  Administered 2020-01-04 (×2): 4 [IU] via SUBCUTANEOUS
  Filled 2020-01-04 (×2): qty 1

## 2020-01-04 MED ORDER — METHADONE HCL 10 MG PO TABS: 20.0000 mg | ORAL_TABLET | Freq: Four times a day (QID) | ORAL | Status: DC

## 2020-01-04 MED ORDER — TORSEMIDE 20 MG PO TABS: 40.0000 mg | ORAL_TABLET | Freq: Every day | ORAL | 3 refills | Status: DC

## 2020-01-04 MED ORDER — INSULIN ASPART 100 UNIT/ML ~~LOC~~ SOLN
0.0000 [IU] | Freq: Three times a day (TID) | SUBCUTANEOUS | Status: DC
  Administered 2020-01-04: 3 [IU] via SUBCUTANEOUS
  Administered 2020-01-04: 11 [IU] via SUBCUTANEOUS
  Filled 2020-01-04 (×2): qty 1

## 2020-01-04 MED ORDER — INSULIN ASPART 100 UNIT/ML ~~LOC~~ SOLN: 0.0000 [IU] | Freq: Three times a day (TID) | SUBCUTANEOUS | Status: DC

## 2020-01-04 MED ORDER — METHADONE HCL 10 MG PO TABS
20.0000 mg | ORAL_TABLET | Freq: Four times a day (QID) | ORAL | Status: DC
  Administered 2020-01-04: 20 mg via ORAL
  Filled 2020-01-04: qty 2

## 2020-01-04 NOTE — Discharge Instructions (Signed)
Heart Failure, Self Care Heart failure is a serious condition. This sheet explains things you need to do to take care of yourself at home. To help you stay as healthy as possible, you may be asked to change your diet, take certain medicines, and make other changes in your life. Your doctor may also give you more specific instructions. If you have problems or questions, call your doctor. What are the risks? Having heart failure makes it more likely for you to have some problems. These problems can get worse if you do not take good care of yourself. Problems may include:  Blood clotting problems. This may cause a stroke.  Damage to the kidneys, liver, or lungs.  Abnormal heart rhythms. Supplies needed:  Scale for weighing yourself.  Blood pressure monitor.  Notebook.  Medicines. How to care for yourself when you have heart failure Medicines Take over-the-counter and prescription medicines only as told by your doctor. Take your medicines every day.  Do not stop taking your medicine unless your doctor tells you to do so.  Do not skip any medicines.  Get your prescriptions refilled before you run out of medicine. This is important. Eating and drinking   Eat heart-healthy foods. Talk with a diet specialist (dietitian) to create an eating plan.  Choose foods that: ? Have no trans fat. ? Are low in saturated fat and cholesterol.  Choose healthy foods, such as: ? Fresh or frozen fruits and vegetables. ? Fish. ? Low-fat (lean) meats. ? Legumes, such as beans, peas, and lentils. ? Fat-free or low-fat dairy products. ? Whole-grain foods. ? High-fiber foods.  Limit salt (sodium) if told by your doctor. Ask your diet specialist to tell you which seasonings are healthy for your heart.  Cook in healthy ways instead of frying. Healthy ways of cooking include roasting, grilling, broiling, baking, poaching, steaming, and stir-frying.  Limit how much fluid you drink, if told by your  doctor. Alcohol use  Do not drink alcohol if: ? Your doctor tells you not to drink. ? Your heart was damaged by alcohol, or you have very bad heart failure. ? You are pregnant, may be pregnant, or are planning to become pregnant.  If you drink alcohol: ? Limit how much you use to:  0-1 drink a day for women.  0-2 drinks a day for men. ? Be aware of how much alcohol is in your drink. In the [place] one drink equals one 12 oz bottle of beer (355 mL), one 5 oz glass of wine (148 mL), or one 1 oz glass of hard liquor (44 mL). Lifestyle   Do not use any products that contain nicotine or tobacco, such as cigarettes, e-cigarettes, and chewing tobacco. If you need help quitting, ask your doctor. ? Do not use nicotine gum or patches before talking to your doctor.  Do not use illegal drugs.  Lose weight if told by your doctor.  Do physical activity if told by your doctor. Talk to your doctor before you begin an exercise if: ? You are an older adult. ? You have very bad heart failure.  Learn to manage stress. If you need help, ask your doctor.  Get rehab (rehabilitation) to help you stay independent and to help with your quality of life.  Plan time to rest when you get tired. Check weight and blood pressure   Weigh yourself every day. This will help you to know if fluid is building up in your body. ? Weigh yourself every morning  after you pee (urinate) and before you eat breakfast. ? Wear the same amount of clothing each time. ? Write down your daily weight. Give your record to your doctor.  Check and write down your blood pressure as told by your doctor.  Check your pulse as told by your doctor. Dealing with very hot and very cold weather  If it is very hot: ? Avoid activities that take a lot of energy. ? Use air conditioning or fans, or find a cooler place. ? Avoid caffeine and alcohol. ? Wear clothing that is loose-fitting, lightweight, and light-colored.  If it is very  cold: ? Avoid activities that take a lot of energy. ? Layer your clothes. ? Wear mittens or gloves, a hat, and a scarf when you go outside. ? Avoid alcohol. Follow these instructions at home:  Stay up to date with shots (vaccines). Get pneumococcal and flu (influenza) shots.  Keep all follow-up visits as told by your doctor. This is important. Contact a doctor if:  You gain weight quickly.  You have increasing shortness of breath.  You cannot do your normal activities.  You get tired easily.  You cough a lot.  You don't feel like eating or feel like you may vomit (nauseous).  You become puffy (swell) in your hands, feet, ankles, or belly (abdomen).  You cannot sleep well because it is hard to breathe.  You feel like your heart is beating fast (palpitations).  You get dizzy when you stand up. Get help right away if:  You have trouble breathing.  You or someone else notices a change in your behavior, such as having trouble staying awake.  You have chest pain or discomfort.  You pass out (faint). These symptoms may be an emergency. Do not wait to see if the symptoms will go away. Get medical help right away. Call your local emergency services (911 in the [place] Do not drive yourself to the hospital. Summary  Heart failure is a serious condition. To care for yourself, you may have to change your diet, take medicines, and make other lifestyle changes.  Take your medicines every day. Do not stop taking them unless your doctor tells you to do so.  Eat heart-healthy foods, such as fresh or frozen fruits and vegetables, fish, lean meats, legumes, fat-free or low-fat dairy products, and whole-grain or high-fiber foods.  Ask your doctor if you can drink alcohol. You may have to stop alcohol use if you have very bad heart failure.  Contact your doctor if you gain weight quickly or feel that your heart is beating too fast. Get help right away if you pass out, or have chest pain  or trouble breathing. This information is not intended to replace advice given to you by your health care provider. Make sure you discuss any questions you have with your health care provider. Document Revised: 01/26/2019 Document Reviewed: 01/27/2019 Elsevier Patient Education  [place].

## 2020-01-04 NOTE — Discharge Summary (Addendum)
Physician Discharge Summary  Lindsay Harmon JIR:[id number] DOB: 1956-06-05 DOA: 01/03/2020  PCP: Venia Carbon, MD  Admit date: 01/03/2020 Discharge date: 01/04/2020  Admitted From: Home Disposition: Home  Recommendations for Outpatient Follow-up:  Follow up with heart failure clinic on 3/17 Vascular surgery will arrange outpatient follow-up  Home Health: None Equipment/Devices: Rolling walker, 4 L nasal cannula  Discharge Condition: Fair CODE STATUS: Full code Diet recommendation: Heart Healthy / Carb Modified     Discharge Diagnoses:  Principal Problem:   Acute on chronic diastolic heart failure (HCC)  Active Problems:   Cor pulmonale, chronic (HCC)   Type 2 diabetes mellitus with neurological manifestations, controlled ([place])   Essential hypertension, benign   Atherosclerotic heart disease of native coronary artery with angina pectoris (HCC)   PVD (peripheral vascular disease) (HCC)   Diabetes, polyneuropathy ([place])   DM (diabetes mellitus), type 2, uncontrolled, periph vascular complic (HCC)   Chronic renal disease, stage IV (HCC)   COPD (chronic obstructive pulmonary disease) ([place])   RVF (right ventricular failure) ([place])   Pulmonary hypertension ([place])   Brief narrative/HPI 64 year old female with severe pulmonary hypertension, cor pulmonale, chronic kidney disease stage IV, chronic pain syndrome on daily methadone, COPD on 4 L home O2 via nasal cannula presented to the ED with increasing shortness of breath for past 4 days.  However on the day prior to admission she was more dyspneic and required up to 5-6 L O2 via nasal cannula and sats would drop up to 70s.  She took extra dose of torsemide without symptomatic relief.  Also noticed that her legs were more swollen. Reports being adherent to her diet and fluid intake and taking her medications as instructed. Denies any chest pain, palpitations, fever or recent illness. In the ED patient was found to have clinical signs  and symptoms of acute on chronic CHF.  Labs showed potassium of 3 and stable creatinine at baseline.  BNP elevated at 1800.  Given breathing treatments and IV Lasix and observed on hospitalist service.  Hospital course Acute on chronic diastolic CHF/right-sided heart failure Has underlying severe cor pulmonale and pulmonary hypertension.  Placed on IV Lasix 80 mg every 12 hours.  Negative balance of 1.5 L since admission.  Weight seems at baseline. Leg swellings much improved during my exam this morning. Patient feels her breathing to be back to her baseline and maintaining sats on 4 L. A 2D echo done showed normal LVEF of 75-10%, grade 1 diastolic dysfunction and severe pulmonary hypertension of 110-_0 mmHg.  Patient to resume torsemide with increased dose (was on 40 mg daily and have switched to 40 mg daily alternating with 60 mg).  Instructed to monitor weight and fluid intake. Outpatient follow-up at heart failure clinic arranged for next week.  (Followed at the heart failure clinic). Resume aspirin and beta-blocker.  Active problems Cor pulmonale and severe pulmonary artery hypertension Continue tyvaso  COPD with chronic respiratory failure Continue home inhalers  Hypokalemia Replenished  Chronic kidney disease stage IV Renal function at baseline.  Diabetes mellitus type 2, uncontrolled with hyperglycemia, insulin-dependent A1c of 8.3.  Resume home dose insulin.  Outpatient follow-up  Peripheral vascular disease Patient complained of having off-and-on bluish discoloration of bilateral feet.  Distal tibial pulses difficult to palpate but palpable on bedside Doppler.  Lower extremity arterial Doppler showed diffuse atherosclerosis throughout lower extremity arteries with possible region of moderate focal stenosis of the mid right SFA.  No completely occluded segments identified. Discussed  with vascular surgery (Dr. Lucky Cowboy) who will arrange outpatient follow-up.  Continue aspirin and  statin.  Disposition: Home Family communication: None   Procedure: 2D echo        Discharge Instructions  Discharge Instructions    AMB referral to CHF clinic   Complete by: As directed    AMB referral to pulmonary rehabilitation   Complete by: As directed    Please select a program: Respiratory Care Services   Respiratory Care Services Diagnosis: Heart Failure   After initial evaluation and assessments completed: Virtual Based Care may be provided alone or in conjunction with Pulmonary Rehab/Respiratory Care services based on patient barriers.: Yes   Amb Referral to Phase II Cardiac  Rehab   Complete by: As directed    Diagnosis: Heart Failure (see criteria below if ordering Phase II)   Heart Failure Type: Chronic Diastolic   After initial evaluation and assessments completed: Virtual Based Care may be provided alone or in conjunction with Phase 2 Cardiac Rehab based on patient barriers.: Yes     Allergies as of 01/04/2020      Reactions   Sertraline Hcl Other (See Comments)   Altered Mental Status      Medication List    TAKE these medications   albuterol 108 (90 Base) MCG/ACT inhaler Commonly known as: Ventolin HFA USE 2 PUFFS EVERY SIX HOURS AS NEEDED FOR WHEEZING OR SHORTNESS OF BREATH   albuterol (2.5 MG/3ML) 0.083% nebulizer solution Commonly known as: PROVENTIL USE 1 VIAL (3ML) BY NEBULIZATION EVERY 6HOURS AS NEEDED FOR WHEEZING OR SHORTNESS OF BREATH   ALPRAZolam 0.25 MG tablet Commonly known as: XANAX Take 1 tablet (0.25 mg total) by mouth daily as needed for anxiety.   Asmanex (120 Metered Doses) 220 MCG/INH inhaler Generic drug: mometasone Inhale 2 puffs into the lungs 2 (two) times daily.   aspirin EC 81 MG tablet Take 1 tablet (81 mg total) by mouth daily.   calcitRIOL 0.5 MCG capsule Commonly known as: ROCALTROL Take 0.5 mcg by mouth daily.   cholecalciferol 1000 units tablet Commonly known as: VITAMIN D Take 1,000 Units by mouth at  bedtime.   ferrous sulfate 325 (65 FE) MG tablet Take 325 mg by mouth daily with breakfast.   Flutter Devi Use as directed   gabapentin 300 MG capsule Commonly known as: NEURONTIN Take 1 capsule in the am and 2 at bedtime.   glucose blood test strip Commonly known as: ONE TOUCH ULTRA TEST USE 1 STRIP TO TEST BLOOD SUGAR ONCE DAILY Dx Code E11.51   Insulin Pen Needle 31G X 5 MM Misc Commonly known as: Sure Comfort Pen Needles USE 1 PEN NEEDLE TO INJECT INSULIN   Lantus SoloStar 100 UNIT/ML Solostar Pen Generic drug: insulin glargine INJECT 20 UNITS INTO THE SKIN AT BEDTIME What changed: See the new instructions.   loratadine 10 MG tablet Commonly known as: CLARITIN Take 10 mg by mouth 2 (two) times daily.   lovastatin 40 MG tablet Commonly known as: MEVACOR Take 2 tablets (80 mg total) by mouth at bedtime.   methadone 10 MG tablet Commonly known as: DOLOPHINE Take 2 tablets (20 mg total) by mouth every 6 (six) hours as needed. What changed: when to take this   metoprolol tartrate 25 MG tablet Commonly known as: LOPRESSOR Take 0.5 tablets (12.5 mg total) by mouth 2 (two) times daily.   nitroGLYCERIN 0.4 MG SL tablet Commonly known as: NITROSTAT Place 1 tablet (0.4 mg total) under the tongue every 5 (  five) minutes as needed for chest pain.   pantoprazole 40 MG tablet Commonly known as: PROTONIX Take 1 tablet (40 mg total) by mouth 2 (two) times daily.   Stiolto Respimat 2.5-2.5 MCG/ACT Aers Generic drug: Tiotropium Bromide-Olodaterol INHALE 2 PUFFS BY MOUTH INTO THE LUNGS DAILY.   torsemide 20 MG tablet Commonly known as: DEMADEX Take 2 tablets (40 mg total) by mouth daily. Take 40 mg ( 2 tablet ) on M,W,F and 60 MG ( 3 tablets) on Tu, th, sat and sun. What changed: additional instructions   Tyvaso 0.6 MG/ML Soln Generic drug: Treprostinil Inhale 18 mcg into the lungs 4 (four) times daily.      Follow-up Information    [facility] Follow up on 01/12/2020.   Specialty: Cardiology Why: at 12:00pm. Enter through the [place] entrance Contact information: [place] [address] [phone]       Algernon Huxley, MD. Call in 3 week(s).   Specialties: Vascular Surgery, Radiology, Interventional Cardiology Contact information: [place] [address] [phone]          Allergies  Allergen Reactions  . Sertraline Hcl Other (See Comments)    Altered Mental Status     Procedures/Studies: DG Chest 2 View  Result Date: 01/03/2020 CLINICAL DATA:  Shortness of breath. EXAM: CHEST - 2 VIEW COMPARISON:  November 02, 2018. FINDINGS: The heart size and mediastinal contours are within normal limits. No pneumothorax is noted. Mild bibasilar subsegmental atelectasis is noted with minimal pleural effusions. The visualized skeletal structures are unremarkable. IMPRESSION: Mild bibasilar subsegmental atelectasis with minimal pleural effusions. No other significant abnormality seen in the chest. Electronically Signed   By: Marijo Conception M.D.   On: 01/03/2020 12:51   US Venous Img Lower Bilateral (DVT)  Result Date: 01/03/2020 CLINICAL DATA:  Lower extremity edema.  Recent hypoxia EXAM: BILATERAL LOWER EXTREMITY VENOUS DUPLEX ULTRASOUND TECHNIQUE: Gray-scale sonography with graded compression, as well as color Doppler and duplex ultrasound were performed to evaluate the lower extremity deep venous systems from the level of the common femoral vein and including the common femoral, femoral, profunda femoral, popliteal and calf veins including the posterior tibial, peroneal and gastrocnemius veins when visible. The superficial great saphenous vein was also interrogated. Spectral Doppler was utilized to evaluate flow at rest and with distal augmentation maneuvers in the common femoral, femoral and popliteal veins. COMPARISON:  Left lower extremity venous duplex  ultrasound December 17, 2019 FINDINGS: RIGHT LOWER EXTREMITY Common Femoral Vein: No evidence of thrombus. Normal compressibility, respiratory phasicity and response to augmentation. Saphenofemoral Junction: No evidence of thrombus. Normal compressibility and flow on color Doppler imaging. Profunda Femoral Vein: No evidence of thrombus. Normal compressibility and flow on color Doppler imaging. Femoral Vein: No evidence of thrombus. Normal compressibility, respiratory phasicity and response to augmentation. Popliteal Vein: No evidence of thrombus. Normal compressibility, respiratory phasicity and response to augmentation. Calf Veins: No evidence of thrombus. Normal compressibility and flow on color Doppler imaging. Superficial Great Saphenous Vein: No evidence of thrombus. Normal compressibility. Venous Reflux:  None. Other Findings:  None. LEFT LOWER EXTREMITY Common Femoral Vein: No evidence of thrombus. Normal compressibility, respiratory phasicity and response to augmentation. Saphenofemoral Junction: No evidence of thrombus. Normal compressibility and flow on color Doppler imaging. Profunda Femoral Vein: No evidence of thrombus. Normal compressibility and flow on color Doppler imaging. Femoral Vein: No evidence of thrombus. Normal compressibility, respiratory phasicity and response  to augmentation. Popliteal Vein: No evidence of thrombus. Normal compressibility, respiratory phasicity and response to augmentation. Calf Veins: No evidence of thrombus. Normal compressibility and flow on color Doppler imaging. Superficial Great Saphenous Vein: No evidence of thrombus. Normal compressibility. Venous Reflux:  None. Other Findings:  None. IMPRESSION: No evidence of deep venous thrombosis in either lower extremity. Electronically Signed   By: Lowella Grip III M.D.   On: 01/03/2020 15:58   US Venous Img Lower Unilateral Left  Result Date: 12/17/2019 CLINICAL DATA:  Left lower extremity edema for the past 3  weeks. Evaluate for DVT. EXAM: LEFT LOWER EXTREMITY VENOUS DOPPLER ULTRASOUND TECHNIQUE: Gray-scale sonography with graded compression, as well as color Doppler and duplex ultrasound were performed to evaluate the lower extremity deep venous systems from the level of the common femoral vein and including the common femoral, femoral, profunda femoral, popliteal and calf veins including the posterior tibial, peroneal and gastrocnemius veins when visible. The superficial great saphenous vein was also interrogated. Spectral Doppler was utilized to evaluate flow at rest and with distal augmentation maneuvers in the common femoral, femoral and popliteal veins. COMPARISON:  None. FINDINGS: Contralateral Common Femoral Vein: Respiratory phasicity is normal and symmetric with the symptomatic side. No evidence of thrombus. Normal compressibility. Common Femoral Vein: No evidence of thrombus. Normal compressibility, respiratory phasicity and response to augmentation. Saphenofemoral Junction: No evidence of thrombus. Normal compressibility and flow on color Doppler imaging. Profunda Femoral Vein: No evidence of thrombus. Normal compressibility and flow on color Doppler imaging. Femoral Vein: No evidence of thrombus. Normal compressibility, respiratory phasicity and response to augmentation. Popliteal Vein: No evidence of thrombus. Normal compressibility, respiratory phasicity and response to augmentation. Calf Veins: No evidence of thrombus. Normal compressibility and flow on color Doppler imaging. Superficial Great Saphenous Vein: No evidence of thrombus. Normal compressibility. Venous Reflux:  None. Other Findings: There is a moderate to large amount of subcutaneous edema at level the left calf (image 41). IMPRESSION: No evidence of DVT within the left lower extremity. Electronically Signed   By: Sandi Mariscal M.D.   On: 12/17/2019 17:18   [place] Lower Ext Art Bilat  Result Date: 01/04/2020 CLINICAL DATA:  Peripheral vascular  disease, coronary artery disease, hypertension, diabetes, lower extremity edema, cyanosis and left lower extremity pain. EXAM: BILATERAL LOWER EXTREMITY ARTERIAL DUPLEX SCAN TECHNIQUE: Gray-scale sonography as well as color Doppler and duplex ultrasound was performed to evaluate the arteries of both lower extremities including the common, superficial and profunda femoral arteries, popliteal artery and calf arteries. COMPARISON:  None. FINDINGS: Right Lower Extremity Inflow: Mild calcified plaque in the distal common femoral artery. Monophasic common femoral artery waveform. Outflow: Calcified plaque in the profunda femoral artery and SFA. Both arteries demonstrate monophasic waveforms. The mid right SFA demonstrates turbulent flow with some velocity elevation potentially at an area of stenosis. Maximal velocity measured is 197 centimeter/second. Popliteal artery demonstrates monophasic waveform. Runoff: Open anterior tibial and posterior tibial arteries demonstrate monophasic waveforms. Left Lower Extremity Inflow: Calcified plaque present in the common femoral artery. Common femoral waveform is monophasic. Outflow: Calcified plaque throughout the visualized left SFA with monophasic waveform. Profunda femoral artery demonstrates monophasic waveform. Popliteal artery with monophasic waveform. Runoff: Anterior tibial and posterior tibial arteries are open and demonstrate monophasic waveforms. IMPRESSION: Evidence of atherosclerosis throughout the arteries of both lower extremities and diffuse monophasic waveforms. There may be a region of moderate focal stenosis of the mid right SFA based on velocity elevation. Aortoiliac inflow disease is not excluded.  No completely occluded segments are identified by duplex ultrasound. Consider further evaluation with CT angiography of the abdominal aorta with bilateral iliofemoral runoff if there is any clinical concern for significant lower extremity ischemia. Electronically  Signed   By: Aletta Edouard M.D.   On: 01/04/2020 12:45   ECHOCARDIOGRAM COMPLETE  Result Date: 01/04/2020    ECHOCARDIOGRAM REPORT   Patient Name:   Lindsay Harmon Date of Exam: 01/04/2020 Medical Rec #:  [id number]       Height:       61.0 in Accession #:    [id number]      Weight:       155.6 lb Date of Birth:  [birth year]        BSA:          1.698 m Patient Age:    19 years        BP:           113/78 mmHg Patient Gender: F               HR:           83 bpm. Exam Location:  [facility] Procedure: 2D Echo, Color Doppler and Cardiac Doppler Indications:     CHf 428.0  History:         Patient has prior history of Echocardiogram examinations, most                  recent 08/04/2018. Pulmonary HTN and COPD; Risk                  Factors:Hypertension.  Sonographer:     Sherrie Sport RDCS (AE) Referring Phys:  [id number] Sueanne Margarita Diagnosing Phys: Nelva Bush MD IMPRESSIONS  1. Left ventricular ejection fraction, by estimation, is 60 to 65%. The left ventricle has normal function. The left ventricle has no regional wall motion abnormalities. There is mild left ventricular hypertrophy. Left ventricular diastolic parameters are consistent with Grade I diastolic dysfunction (impaired relaxation). There is the interventricular septum is flattened in systole and diastole, consistent with right ventricular pressure and volume overload.  2. There is severe pulmonary hypertension with PA systolic pressure of 371-062 mmHg plus central venous pressure (which exceeds recorded systemic pressure). Right ventricular systolic function is moderately reduced. The right ventricular size is severely enlarged. mildly increased right ventricular wall thickness.  3. Right atrial size was moderately dilated.  4. The mitral valve is degenerative. No evidence of mitral valve regurgitation. No evidence of mitral stenosis.  5. Tricuspid valve regurgitation is severe.  6. The aortic valve has an indeterminant number of cusps. Aortic valve  regurgitation is not visualized. Moderate to severe aortic valve stenosis. Aortic valve area, by VTI measures 0.60 cm. Aortic valve mean gradient measures 31.3 mmHg. Aortic valve Vmax measures 3.41 m/s. FINDINGS  Left Ventricle: Left ventricular ejection fraction, by estimation, is 60 to 65%. The left ventricle has normal function. The left ventricle has no regional wall motion abnormalities. The left ventricular internal cavity size was normal in size. There is  mild left ventricular hypertrophy. The interventricular septum is flattened in systole and diastole, consistent with right ventricular pressure and volume overload. Left ventricular diastolic parameters are consistent with Grade I diastolic dysfunction (impaired relaxation). Right Ventricle: There is severe pulmonary hypertension with PA systolic pressure of 694-854 mmHg plus central venous pressure (which exceeds recorded systemic pressure). The right ventricular size is severely enlarged. Mildly increased right ventricular  wall thickness. Right ventricular systolic function  is moderately reduced. Left Atrium: Left atrial size was normal in size. Right Atrium: Right atrial size was moderately dilated. Pericardium: Trivial pericardial effusion is present. The pericardial effusion is posterior to the left ventricle. Mitral Valve: The mitral valve is degenerative in appearance. There is mild thickening of the mitral valve leaflet(s). Mild to moderate mitral annular calcification. No evidence of mitral valve regurgitation. No evidence of mitral valve stenosis. Tricuspid Valve: The tricuspid valve is normal in structure. Tricuspid valve regurgitation is severe. Aortic Valve: The aortic valve has an indeterminant number of cusps. . There is moderate thickening and mild calcification of the aortic valve. Aortic valve regurgitation is not visualized. Moderate to severe aortic stenosis is present. There is moderate  thickening of the aortic valve. There is mild  calcification of the aortic valve. Aortic valve mean gradient measures 31.3 mmHg. Aortic valve peak gradient measures 46.5 mmHg. Aortic valve area, by VTI measures 0.60 cm. Pulmonic Valve: The pulmonic valve was not well visualized. Pulmonic valve regurgitation is not visualized. No evidence of pulmonic stenosis. Aorta: The aortic root is normal in size and structure. Pulmonary Artery: The pulmonary artery is not well seen. Venous: The inferior vena cava was not well visualized. IAS/Shunts: The interatrial septum was not well visualized.  LEFT VENTRICLE PLAX 2D LVIDd:         3.27 cm  Diastology LVIDs:         2.24 cm  LV e' lateral:   5.87 cm/s LV PW:         1.15 cm  LV E/e' lateral: 12.0 LV IVS:        1.25 cm  LV e' medial:    5.44 cm/s LVOT diam:     2.00 cm  LV E/e' medial:  12.9 LV SV:         38 LV SV Index:   22 LVOT Area:     3.14 cm  RIGHT VENTRICLE RV Basal diam:  4.63 cm RV S prime:     4.03 cm/s TAPSE (M-mode): 1.2 cm LEFT ATRIUM             Index       RIGHT ATRIUM           Index LA diam:        4.00 cm 2.36 cm/m  RA Area:     26.20 cm LA Vol (A2C):   76.2 ml 44.89 ml/m RA Volume:   94.80 ml  55.84 ml/m LA Vol (A4C):   32.0 ml 18.85 ml/m LA Biplane Vol: 50.1 ml 29.51 ml/m  AORTIC VALVE                    PULMONIC VALVE AV Area (Vmax):    0.63 cm     PV Vmax:        0.55 m/s AV Area (Vmean):   0.57 cm     PV Peak grad:   1.2 mmHg AV Area (VTI):     0.60 cm     RVOT Peak grad: 3 mmHg AV Vmax:           341.00 cm/s AV Vmean:          252.000 cm/s AV VTI:            0.628 m AV Peak Grad:      46.5 mmHg AV Mean Grad:      31.3 mmHg LVOT Vmax:         68.70 cm/s LVOT  Vmean:        46.100 cm/s LVOT VTI:          0.120 m LVOT/AV VTI ratio: 0.19  AORTA Ao Root diam: 2.50 cm MITRAL VALVE                TRICUSPID VALVE MV Area (PHT): 3.77 cm     TR Peak grad:   116.6 mmHg MV Decel Time: 201 msec     TR Vmax:        540.00 cm/s MV E velocity: 70.30 cm/s MV A velocity: 109.00 cm/s  SHUNTS MV E/A  ratio:  0.64         Systemic VTI:  0.12 m                             Systemic Diam: 2.00 cm Nelva Bush MD Electronically signed by Nelva Bush MD Signature Date/Time: 01/04/2020/12:40:23 PM    Final        Subjective: Reports breathing much better this morning.  Discharge Exam: Vitals:   01/04/20 1233 01/04/20 1350  BP: 97/69   Pulse: 74   Resp: 19   Temp: 98.2 F (36.8 C)   SpO2: 95% 95%   Vitals:   01/04/20 0736 01/04/20 0750 01/04/20 1233 01/04/20 1350  BP: 113/78  97/69   Pulse: 75 83 74   Resp: _0 Temp: 97.6 F (36.4 C)  98.2 F (36.8 C)   TempSrc: Oral  Oral   SpO2: 95% 97% 95% 95%  Weight:      Height:        General: Elderly female not in distress HEENT: Moist mucosa, supple neck Chest: Fine bibasilar crackles CVs: Normal Q7-R9, systolic murmur 3/6 GI: Soft, nondistended, nontender Musculoskeletal: Warm, trace edema bilaterally, warm extremities with poorly palpable bilateral tibial pulse    The results of significant diagnostics from this hospitalization (including imaging, microbiology, ancillary and laboratory) are listed below for reference.     Microbiology: Recent Results (from the past 240 hour(s))  Respiratory Panel by RT PCR (Flu A&B, Covid) - Nasopharyngeal Swab     Status: None   Collection Time: 01/03/20 12:15 PM   Specimen: Nasopharyngeal Swab  Result Value Ref Range Status   SARS Coronavirus 2 by RT PCR NEGATIVE NEGATIVE Final    Comment: (NOTE) SARS-CoV-2 target nucleic acids are NOT DETECTED. The SARS-CoV-2 RNA is generally detectable in upper respiratoy specimens during the acute phase of infection. The lowest concentration of SARS-CoV-2 viral copies this assay can detect is 131 copies/mL. A negative result does not preclude SARS-Cov-2 infection and should not be used as the sole basis for treatment or other patient management decisions. A negative result may occur with  improper specimen collection/handling,  submission of specimen other than nasopharyngeal swab, presence of viral mutation(s) within the areas targeted by this assay, and inadequate number of viral copies (<131 copies/mL). A negative result must be combined with clinical observations, patient history, and epidemiological information. The expected result is Negative. Fact Sheet for Patients:  PinkCheek.be Fact Sheet for Healthcare Providers:  GravelBags.it This test is not yet ap proved or cleared by the [place] FDA and  has been authorized for detection and/or diagnosis of SARS-CoV-2 by FDA under an Emergency Use Authorization (EUA). This EUA will remain  in effect (meaning this test can be used) for the duration of the COVID-19 declaration under Section 564(b)(1) of the Act, 21  U.S.C. section 360bbb-3(b)(1), unless the authorization is terminated or revoked sooner.    Influenza A by PCR NEGATIVE NEGATIVE Final   Influenza B by PCR NEGATIVE NEGATIVE Final    Comment: (NOTE) The Xpert Xpress SARS-CoV-2/FLU/RSV assay is intended as an aid in  the diagnosis of influenza from Nasopharyngeal swab specimens and  should not be used as a sole basis for treatment. Nasal washings and  aspirates are unacceptable for Xpert Xpress SARS-CoV-2/FLU/RSV  testing. Fact Sheet for Patients: PinkCheek.be Fact Sheet for Healthcare Providers: GravelBags.it This test is not yet approved or cleared by the [place] FDA and  has been authorized for detection and/or diagnosis of SARS-CoV-2 by  FDA under an Emergency Use Authorization (EUA). This EUA will remain  in effect (meaning this test can be used) for the duration of the  Covid-19 declaration under Section 564(b)(1) of the Act, 21  U.S.C. section 360bbb-3(b)(1), unless the authorization is  terminated or revoked. Performed at [facility], [address] [place], [place] [address]      Labs: BNP (last 3 results) Recent Labs    01/03/20 1628  BNP 8,329.1*   Basic Metabolic Panel: Recent Labs  Lab 01/03/20 1628 01/03/20 2232 01/04/20 0548  NA 139  --  141  K 3.0*  --  3.8  CL 92*  --  93*  CO2 35*  --  34*  GLUCOSE 141*  --  175*  BUN 26*  --  27*  CREATININE 1.97*  --  1.86*  CALCIUM 9.1  --  9.7  MG  --  1.9 1.9   Liver Function Tests: No results for input(s): AST, ALT, ALKPHOS, BILITOT, PROT, ALBUMIN in the last 168 hours. No results for input(s): LIPASE, AMYLASE in the last 168 hours. No results for input(s): AMMONIA in the last 168 hours. CBC: Recent Labs  Lab 01/03/20 1209 01/04/20 0548  WBC 7.1 9.7  HGB 16.0* 15.9*  HCT 49.5* 49.6*  MCV 94.8 93.9  PLT 187 183   Cardiac Enzymes: No results for input(s): CKTOTAL, CKMB, CKMBINDEX, TROPONINI in the last 168 hours. BNP: Invalid input(s): POCBNP CBG: Recent Labs  Lab 01/04/20 0047 01/04/20 0735 01/04/20 1235  GLUCAP 318* 177* 110*   D-Dimer No results for input(s): DDIMER in the last 72 hours. Hgb A1c Recent Labs    01/03/20 2232  HGBA1C 8.3*   Lipid Profile Recent Labs    01/03/20 2232  CHOL 143  HDL 58  LDLCALC 73  TRIG 59  CHOLHDL 2.5   Thyroid function studies Recent Labs    01/03/20 2232  TSH 1.613   Anemia work up No results for input(s): VITAMINB12, FOLATE, FERRITIN, TIBC, IRON, RETICCTPCT in the last 72 hours. Urinalysis    Component Value Date/Time   COLORURINE YELLOW 03/21/2017 1120   APPEARANCEUR CLOUDY (A) 03/21/2017 1120   LABSPEC <=1.005 (A) 03/21/2017 1120   PHURINE 6.0 03/21/2017 1120   GLUCOSEU 100 (A) 03/21/2017 1120   HGBUR SMALL (A) 03/21/2017 1120   BILIRUBINUR NEGATIVE 03/21/2017 1120   KETONESUR NEGATIVE 03/21/2017 1120   PROTEINUR NEGATIVE 01/27/2017 2250   UROBILINOGEN 0.2 03/21/2017 1120   NITRITE POSITIVE (A) 03/21/2017 1120   LEUKOCYTESUR LARGE (A) 03/21/2017 1120   Sepsis Labs Invalid  input(s): PROCALCITONIN,  WBC,  LACTICIDVEN Microbiology Recent Results (from the past 240 hour(s))  Respiratory Panel by RT PCR (Flu A&B, Covid) - Nasopharyngeal Swab     Status: None   Collection Time: 01/03/20 12:15 PM  Specimen: Nasopharyngeal Swab  Result Value Ref Range Status   SARS Coronavirus 2 by RT PCR NEGATIVE NEGATIVE Final    Comment: (NOTE) SARS-CoV-2 target nucleic acids are NOT DETECTED. The SARS-CoV-2 RNA is generally detectable in upper respiratoy specimens during the acute phase of infection. The lowest concentration of SARS-CoV-2 viral copies this assay can detect is 131 copies/mL. A negative result does not preclude SARS-Cov-2 infection and should not be used as the sole basis for treatment or other patient management decisions. A negative result may occur with  improper specimen collection/handling, submission of specimen other than nasopharyngeal swab, presence of viral mutation(s) within the areas targeted by this assay, and inadequate number of viral copies (<131 copies/mL). A negative result must be combined with clinical observations, patient history, and epidemiological information. The expected result is Negative. Fact Sheet for Patients:  PinkCheek.be Fact Sheet for Healthcare Providers:  GravelBags.it This test is not yet ap proved or cleared by the [place] FDA and  has been authorized for detection and/or diagnosis of SARS-CoV-2 by FDA under an Emergency Use Authorization (EUA). This EUA will remain  in effect (meaning this test can be used) for the duration of the COVID-19 declaration under Section 564(b)(1) of the Act, 21 U.S.C. section 360bbb-3(b)(1), unless the authorization is terminated or revoked sooner.    Influenza A by PCR NEGATIVE NEGATIVE Final   Influenza B by PCR NEGATIVE NEGATIVE Final    Comment: (NOTE) The Xpert Xpress SARS-CoV-2/FLU/RSV assay is intended as an aid in   the diagnosis of influenza from Nasopharyngeal swab specimens and  should not be used as a sole basis for treatment. Nasal washings and  aspirates are unacceptable for Xpert Xpress SARS-CoV-2/FLU/RSV  testing. Fact Sheet for Patients: PinkCheek.be Fact Sheet for Healthcare Providers: GravelBags.it This test is not yet approved or cleared by the [place] FDA and  has been authorized for detection and/or diagnosis of SARS-CoV-2 by  FDA under an Emergency Use Authorization (EUA). This EUA will remain  in effect (meaning this test can be used) for the duration of the  Covid-19 declaration under Section 564(b)(1) of the Act, 21  U.S.C. section 360bbb-3(b)(1), unless the authorization is  terminated or revoked. Performed at [facility], [address], [place], [place] [address]      Time coordinating discharge: 25 minutes  SIGNED:   Louellen Molder, MD  [facility] 01/04/2020, 3:04 PM Pager   If 7PM-7AM, please contact night-coverage [email or web address] Password [email or web address]

## 2020-01-04 NOTE — Progress Notes (Signed)
*  PRELIMINARY RESULTS* Echocardiogram 2D Echocardiogram has been performed.  Sherrie Sport 01/04/2020, 10:25 AM

## 2020-01-04 NOTE — Plan of Care (Signed)

## 2020-01-04 NOTE — Clinical Social Work Note (Signed)
1.  Do you have a scale? Yes 2.  Do you weigh yourself daily? Yes 3.  Two pounds over night or 5 in one week call your doctor because it could be fluid overload? Yes "I keep a record of it" 4.  Do you go to the Heart Failure Clinic? Yes 5. An appointment with heart failure clinic will be made before discharge if. Scheduled for 3/17 6. If patient is home bound, home health can be arranged with an agency that provides a heart failure protocol.   [place], [place]

## 2020-01-04 NOTE — Progress Notes (Signed)
Inpatient Diabetes Program Recommendations  AACE/ADA: New Consensus Statement on Inpatient Glycemic Control (2015)  Target Ranges:  Prepandial:   less than 140 mg/dL      Peak postprandial:   less than 180 mg/dL (1-2 hours)      Critically ill patients:  140 - 180 mg/dL   Lab Results  Component Value Date   GLUCAP 110 (H) 01/04/2020   HGBA1C 8.3 (H) 01/03/2020    Review of Glycemic Control Results for Lindsay Harmon, Lindsay Harmon (MRN [id number]) as of 01/04/2020 13:48  Ref. Range 04/24/2018 08:12 04/24/2018 11:46 01/04/2020 00:47 01/04/2020 07:35 01/04/2020 12:35  Glucose-Capillary Latest Ref Range: 70 - 99 mg/dL 119 (H) 181 (H) 318 (H) 177 (H) 110 (H)   Diabetes history: DM Outpatient Diabetes medications: Lantus 20 units Current orders for Inpatient glycemic control: Lantus 20 units + Novolog 4 units tid meal coverage + Novolog moderate correction tid  Inpatient Diabetes Program Recommendations:   -Decrease Lantus to 50% home insulin dose = 10 units -Decrease Novolog correction to sensitive tid Secure chat to Dr. Clementeen Graham with recommendations.  Thank you, Nani Gasser. Nickayla Mcinnis, RN, MSN, CDE  Diabetes Coordinator Inpatient Glycemic Control Team Team Pager [phone] (8am-5pm) 01/04/2020 1:49 PM

## 2020-01-04 NOTE — Progress Notes (Signed)
Patient given discharge instructions. Patient verbalized understanding of medication change. IV taken out and tele monitor off. Patient verbalizes weighing herself everyday after getting up and going to the bathroom. Reds vest reading was 34 . Patient's on picking her up.

## 2020-01-04 NOTE — Progress Notes (Signed)
PT Cancellation Note  Patient Details Name: Lindsay Harmon MRN: [id number] DOB: [birth year]   Cancelled Treatment:    Reason Eval/Treat Not Completed: Medical issues which prohibited therapy(Consult received and chart reviewed.  Per orders, patient pending imaging to rule out clot and assess flow of LE arterial system.  Will hold PT eval until test complete, results received and patient cleared for activity.)   Brittain Smithey H. Owens Shark, PT, DPT, NCS 01/04/20, 10:05 AM [phone]

## 2020-01-05 ENCOUNTER — Other Ambulatory Visit: Payer: Self-pay | Admitting: *Deleted

## 2020-01-05 ENCOUNTER — Telehealth: Payer: Self-pay

## 2020-01-05 DIAGNOSIS — I5032 Chronic diastolic (congestive) heart failure: Secondary | ICD-10-CM

## 2020-01-05 DIAGNOSIS — I272 Pulmonary hypertension, unspecified: Secondary | ICD-10-CM

## 2020-01-05 NOTE — Telephone Encounter (Signed)
Transition Care Management Follow-up Telephone Call  Date of discharge and from where: 01/04/2020, [facility]  How have you been since you were released from the hospital? Patient states that she is doing good. No symptoms at this time.   Any questions or concerns? No   Items Reviewed:  Did the pt receive and understand the discharge instructions provided? Yes   Medications obtained and verified? Yes   Any new allergies since your discharge? No   Dietary orders reviewed? Yes  Do you have support at home? Yes   Functional Questionnaire: (I = Independent and D = Dependent) ADLs: I  Bathing/Dressing- I  Meal Prep- I  Eating- I  Maintaining continence- I  Transferring/Ambulation- I  Managing Meds- I  Follow up appointments reviewed:   PCP Hospital f/u appt confirmed? No  Patient states that she has several follow up visits with specialists and she doesn't see the need to schedule a follow up with her PCP at this time.   [facility] f/u appt confirmed? Yes   Are transportation arrangements needed? No   If their condition worsens, is the pt aware to call PCP or go to the Emergency Dept.? Yes  Was the patient provided with contact information for the PCP's office or ED? Yes  Was to pt encouraged to call back with questions or concerns? Yes

## 2020-01-05 NOTE — Telephone Encounter (Signed)
Left message on voicemail for patient to return call- need to complete TCM and schedule follow up visit.

## 2020-01-06 NOTE — Telephone Encounter (Signed)
I can just see her prn. Did have CHF related to her pulmonary HTN---seeing the specialists should be adequate.

## 2020-01-10 ENCOUNTER — Other Ambulatory Visit: Payer: Self-pay

## 2020-01-10 ENCOUNTER — Inpatient Hospital Stay: Payer: PPO

## 2020-01-10 ENCOUNTER — Telehealth: Payer: Self-pay | Admitting: Family

## 2020-01-10 ENCOUNTER — Inpatient Hospital Stay: Payer: PPO | Attending: Oncology

## 2020-01-10 DIAGNOSIS — D751 Secondary polycythemia: Secondary | ICD-10-CM | POA: Diagnosis not present

## 2020-01-10 LAB — HEMOGLOBIN AND HEMATOCRIT, BLOOD
HCT: 49.8 % — ABNORMAL HIGH (ref 36.0–46.0)
Hemoglobin: 16.1 g/dL — ABNORMAL HIGH (ref 12.0–15.0)

## 2020-01-10 NOTE — Progress Notes (Signed)
Labs reviewed with MD. Per MD pt does not need a phlebotomy today. Pt updated and all questions answered at this time. Pt discharged home per order.   Lindsay Harmon [employer]

## 2020-01-10 NOTE — Telephone Encounter (Signed)
LVM with patient to confirm her New Patient appt with CHF Clinic after her referral from the hospital after she was discharged. Asked patient to call back to confirm appointment and to follow up with patients status.   Alyse Low, [place]

## 2020-01-11 ENCOUNTER — Ambulatory Visit (INDEPENDENT_AMBULATORY_CARE_PROVIDER_SITE_OTHER): Payer: PPO | Admitting: Pulmonary Disease

## 2020-01-11 ENCOUNTER — Encounter: Payer: Self-pay | Admitting: Pulmonary Disease

## 2020-01-11 ENCOUNTER — Other Ambulatory Visit: Payer: Self-pay | Admitting: Internal Medicine

## 2020-01-11 ENCOUNTER — Other Ambulatory Visit: Payer: Self-pay | Admitting: Primary Care

## 2020-01-11 VITALS — BP 122/78 | HR 84 | Temp 97.4°F | Ht 61.0 in | Wt 157.8 lb

## 2020-01-11 DIAGNOSIS — I272 Pulmonary hypertension, unspecified: Secondary | ICD-10-CM

## 2020-01-11 DIAGNOSIS — I2781 Cor pulmonale (chronic): Secondary | ICD-10-CM

## 2020-01-11 DIAGNOSIS — R0602 Shortness of breath: Secondary | ICD-10-CM | POA: Diagnosis not present

## 2020-01-11 DIAGNOSIS — J9611 Chronic respiratory failure with hypoxia: Secondary | ICD-10-CM | POA: Diagnosis not present

## 2020-01-11 NOTE — Patient Instructions (Signed)
Pulmonary hypertension Diastolic heart failure Cor pulmonale  Continue current treatments Continue using oxygen Pace yourself well  The flutter device(green device) is useful if you have a cough and congestion  I will see you back in about 3 months  Call with any concerns

## 2020-01-11 NOTE — Progress Notes (Signed)
Lindsay Harmon    [id number]    [birth year]  Primary Care Physician:Letvak, Theophilus Kinds, MD  Referring Physician: Venia Carbon, MD [address] [place],   [address]  Chief complaint:   Patient seen for follow-up She was recently hospitalized for hypoxemic respiratory failure  HPI:  She has been feeling well since leaving the hospital Shortness of breath is improved Uses oxygen around-the-clock Swelling in her lower extremity is also improved  She is limited with activities of daily living but manages to pace herself well  She does have advanced chronic obstructive pulmonary disease cor pulmonale, pulmonary hypertension, diastolic heart failure, diabetes chronic kidney disease  Reformed smoker  Uses oxygen at about 5 L  Outpatient Encounter Medications as of 01/11/2020  Medication Sig  . albuterol (PROVENTIL) (2.5 MG/3ML) 0.083% nebulizer solution USE 1 VIAL (3ML) BY NEBULIZATION EVERY 6HOURS AS NEEDED FOR WHEEZING OR SHORTNESS OF BREATH  . albuterol (VENTOLIN HFA) 108 (90 Base) MCG/ACT inhaler USE 2 PUFFS EVERY SIX HOURS AS NEEDED FOR WHEEZING OR SHORTNESS OF BREATH  . ALPRAZolam (XANAX) 0.25 MG tablet Take 1 tablet (0.25 mg total) by mouth daily as needed for anxiety.  Marland Kitchen aspirin EC 81 MG tablet Take 1 tablet (81 mg total) by mouth daily.  . calcitRIOL (ROCALTROL) 0.5 MCG capsule Take 0.5 mcg by mouth daily.   . cholecalciferol (VITAMIN D) 1000 units tablet Take 1,000 Units by mouth at bedtime.   . ferrous sulfate 325 (65 FE) MG tablet Take 325 mg by mouth daily with breakfast.  . gabapentin (NEURONTIN) 300 MG capsule Take 1 capsule in the am and 2 at bedtime.  Marland Kitchen glucose blood (ONE TOUCH ULTRA TEST) test strip USE 1 STRIP TO TEST BLOOD SUGAR ONCE DAILY Dx Code E11.51  . Insulin Pen Needle (B-D UF III MINI PEN NEEDLES) 31G X 5 MM MISC USE 1 PEN NEEDLE TO INJECT INSULIN  . LANTUS SOLOSTAR 100 UNIT/ML Solostar Pen INJECT 20 UNITS INTO THE SKIN AT  BEDTIME (Patient taking differently: Inject 20 Units into the skin daily. )  . loratadine (CLARITIN) 10 MG tablet Take 10 mg by mouth 2 (two) times daily.  Marland Kitchen lovastatin (MEVACOR) 40 MG tablet Take 2 tablets (80 mg total) by mouth at bedtime.  . methadone (DOLOPHINE) 10 MG tablet Take 2 tablets (20 mg total) by mouth every 6 (six) hours as needed. (Patient taking differently: Take 20 mg by mouth in the morning, at noon, in the evening, and at bedtime. )  . metoprolol tartrate (LOPRESSOR) 25 MG tablet Take 0.5 tablets (12.5 mg total) by mouth 2 (two) times daily.  . mometasone (ASMANEX, 120 METERED DOSES,) 220 MCG/INH inhaler Inhale 2 puffs into the lungs 2 (two) times daily.  . nitroGLYCERIN (NITROSTAT) 0.4 MG SL tablet Place 1 tablet (0.4 mg total) under the tongue every 5 (five) minutes as needed for chest pain.  . pantoprazole (PROTONIX) 40 MG tablet Take 1 tablet (40 mg total) by mouth 2 (two) times daily.  Marland Kitchen Respiratory Therapy Supplies (FLUTTER) DEVI Use as directed  . STIOLTO RESPIMAT 2.5-2.5 MCG/ACT AERS INHALE 2 PUFFS BY MOUTH INTO THE LUNGS DAILY.  Marland Kitchen torsemide (DEMADEX) 20 MG tablet Take 2 tablets (40 mg total) by mouth daily. Take 40 mg ( 2 tablet ) on M,W,F and 60 MG ( 3 tablets) on Tu, th, sat and sun.  . Treprostinil (TYVASO) 0.6 MG/ML SOLN Inhale 18 mcg into the lungs 4 (four)  times daily.   No facility-administered encounter medications on file as of 01/11/2020.    Allergies as of 01/11/2020 - Review Complete 01/11/2020  Allergen Reaction Noted  . Sertraline hcl Other (See Comments) 05/29/2016    Past Medical History:  Diagnosis Date  . Allergy   . Aortic atherosclerosis ([place])   . Arthritis   . CKD (chronic kidney disease), stage IV ([place])   . COPD (chronic obstructive pulmonary disease) ([place])    a. 11/2014 PFT's: FEV1 0.87 (38%), FVC 1.41 (48%), FEF 25-75 0.41 (19%). Unable to perform DLCO; b. 12/2017 Simple spirometry ration 67%. FEV1 1.21 L+53% predicted. FVC 1.79L 61%  predicted.  . Coronary artery calcification seen on CT scan    a. 03/2017 CT Chest.  . Depression   . Diabetic neuropathy ([place])   . Erythrocytosis 12/13/2019  . Esophageal stricture   . Familial hematuria   . GERD (gastroesophageal reflux disease)   . Hyperlipidemia   . Hypertension   . IBS (irritable bowel syndrome)   . Nephrolithiasis   . NIDDM (non-insulin dependent diabetes mellitus)   . Obesity, unspecified   . Pulmonary hypertension ([place])    a. WHO Group 3; b. 10/2016 Echo: EF 55-60%, no rwma, Gr1 DD, mild to mod AS, mean grad 53mHg, mildly dil RV w/ mildly reduced RV fxn, mildly dil RA. PASP 1174mg; c. 12/2017 RHC: RA 11, RV 92/12, PA 92/33(57), PCWP 15, Fick CO/CI 5.9/3.3. PVR 7.2 WU. Ao 93%, PA 62/64%.  . Marland KitchenVD (peripheral vascular disease) ([place]    Past Surgical History:  Procedure Laterality Date  . ABDOMINAL HYSTERECTOMY    . CARDIAC CATHETERIZATION    . CARPAL TUNNEL RELEASE    . CESAREAN SECTION    . ERCP N/A 03/17/2017   Procedure: ENDOSCOPIC RETROGRADE CHOLANGIOPANCREATOGRAPHY (ERCP);  Surgeon: WoLucilla LameMD;  [facility];  Service: Endoscopy;  Laterality: N/A;  . EUS N/A 03/13/2017   Procedure: FULL UPPER ENDOSCOPIC ULTRASOUND (EUS) RADIAL;  Surgeon: Burbridge, ReMurray HodgkinsMD;  Location[facility];  Service: Endoscopy;  Laterality: N/A;  . RIGHT HEART CATH N/A 01/22/2018   Procedure: RIGHT HEART CATH;  Surgeon: BeJolaine ArtistMD;  [facility];  Service: Cardiovascular;  Laterality: N/A;  . RIGHT HEART CATHETERIZATION N/A 12/05/2014   Procedure: RIGHT HEART CATH;  Surgeon: HeSinclair GroomsMD;  [facility];  Service: Cardiovascular;  Laterality: N/A;    Family History  Problem Relation Age of Onset  . Diabetes Mother   . Cancer Mother        ovarian,melanoma  . Allergies Father   . Cancer Father        lung  . Cancer Maternal Grandmother        uterine  . Emphysema Paternal Grandfather   . Allergies Sister     . Breast cancer Neg Hx     Social History   Socioeconomic History  . Marital status: Legally Separated    Spouse name: Not on file  . Number of children: 1  . Years of education: Not on file  . Highest education level: Not on file  Occupational History  . 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 Smoking status: Former Smoker    Packs/day: 1.50    Years: 33.00    Pack years: 49.50    Types: Cigarettes    Quit date: 07/28/2005    Years since quitting: 14.4  .  Smokeless tobacco: Never Used  Substance and Sexual Activity  . Alcohol use: No    Alcohol/week: 0.0 standard drinks    Comment: heavy in the past  . Drug use: No  . Sexual activity: Never  Other Topics Concern  . Not on file  Social History Narrative   No living will   Son [name] (then mom) should make decisions for her if she is unable.    Would accept resuscitation attempts but no prolonged ventilation   Not sure about tube feeds but probably wouldn't want them if cognitively unaware   Social Determinants of Health   Financial Resource Strain:   . Difficulty of Paying Living Expenses:   Food Insecurity:   . Worried About Charity fundraiser in the Last Year:   . Arboriculturist in the Last Year:   Transportation Needs:   . Film/video editor (Medical):   Marland Kitchen Lack of Transportation (Non-Medical):   Physical Activity:   . Days of Exercise per Week:   . Minutes of Exercise per Session:   Stress:   . Feeling of Stress :   Social Connections:   . Frequency of Communication with Friends and Family:   . Frequency of Social Gatherings with Friends and Family:   . Attends Religious Services:   . Active Member of Clubs or Organizations:   . Attends Archivist Meetings:   Marland Kitchen Marital Status:   Intimate Partner Violence:   . Fear of Current or Ex-Partner:   . Emotionally Abused:   Marland Kitchen Physically Abused:   . Sexually Abused:     Review of  systems: Review of Systems  Constitutional: Negative for fever and chills.  HENT: Negative.   Eyes: Negative for blurred vision.  Respiratory: as per HPI  Cardiovascular: Negative for chest pain and palpitations.  Neurological: Negative for dizziness, tremors, focal weakness, seizures and loss of consciousness.  All other systems reviewed and are negative.  Physical Exam:  Vitals:   01/11/20 1203  BP: 122/78  Pulse: 84  Temp: (!) 97.4 F (36.3 C)  SpO2: (!) 88%   Gen:      No acute distress HEENT:  EOMI, sclera anicteric Neck:     No masses; no thyromegaly Lungs:    Clear to auscultation bilaterally; decreased effort No added sounds CV:         Regular rate and rhythm; loud systolic murmur Abd:      + bowel sounds; soft, non-tender; no palpable masses, no distension Ext:    No edema; cyanotic extremities Skin:      Warm and dry; ecchymotic areas Neuro: alert and oriented x 3 Psych: normal mood and affect  Data Reviewed: Marland Kitchen  Most recent echocardiogram shows diastolic dysfunction PA systolic pressure 361-443  .  Last PFT was in 2016 showing an FEV1 of 870 cc  .  Last CT was 05/10/2019 showing emphysema Assessment:  .  Advanced chronic obstructive pulmonary disease  .  Severe pulmonary hypertension .  Cor pulmonale .  Decompensated diastolic heart failure .  Hypoxemic respiratory failure .  Deconditioning  She does appear to be holding steady at present Continue oxygen supplementation Continue bronchodilator treatments Continue treprostinil I will see her back in the office in about 3 months  Encouraged to call with any significant concerns    Sherrilyn Rist MD [facility] 01/11/2020, 12:23 PM  CC: Venia Carbon, MD

## 2020-01-12 ENCOUNTER — Ambulatory Visit: Payer: PPO | Admitting: Family

## 2020-01-13 ENCOUNTER — Encounter: Payer: Self-pay | Admitting: Podiatry

## 2020-01-13 ENCOUNTER — Ambulatory Visit: Payer: PPO | Admitting: Podiatry

## 2020-01-13 ENCOUNTER — Other Ambulatory Visit: Payer: Self-pay

## 2020-01-13 DIAGNOSIS — I739 Peripheral vascular disease, unspecified: Secondary | ICD-10-CM | POA: Diagnosis not present

## 2020-01-13 DIAGNOSIS — E1142 Type 2 diabetes mellitus with diabetic polyneuropathy: Secondary | ICD-10-CM

## 2020-01-13 DIAGNOSIS — B351 Tinea unguium: Secondary | ICD-10-CM | POA: Diagnosis not present

## 2020-01-13 DIAGNOSIS — M79675 Pain in left toe(s): Secondary | ICD-10-CM

## 2020-01-13 DIAGNOSIS — N184 Chronic kidney disease, stage 4 (severe): Secondary | ICD-10-CM

## 2020-01-13 DIAGNOSIS — Q828 Other specified congenital malformations of skin: Secondary | ICD-10-CM

## 2020-01-13 DIAGNOSIS — E1149 Type 2 diabetes mellitus with other diabetic neurological complication: Secondary | ICD-10-CM | POA: Diagnosis not present

## 2020-01-13 DIAGNOSIS — M79674 Pain in right toe(s): Secondary | ICD-10-CM | POA: Diagnosis not present

## 2020-01-13 NOTE — Progress Notes (Signed)
This patient presents  to my office for at risk foot care.  This patient requires this care by a professional since this patient will be at risk due to having CKD-4, diabetes with vascular complications and diabetes neuropathy.This patient is unable to cut nails himself since the patient cannot reach her nails.These nails are painful walking and wearing shoes.  This patient presents for at risk foot care today.  She also has painful callus under the outside ball of her right foot.  General Appearance  Alert, conversant and in no acute stress.  Vascular  Dorsalis pedis and posterior tibial  pulses are not  palpable  bilaterally.  Capillary return is within normal limits  bilaterally. Temperature is within normal limits  bilaterally.  Neurologic  Senn-Weinstein monofilament wire test diminished/absent   bilaterally. Muscle power within normal limits bilaterally.  Nails Thick disfigured discolored nails with subungual debris  from hallux to fifth toes bilaterally. No evidence of bacterial infection or drainage bilaterally.  Orthopedic  No limitations of motion  feet .  No crepitus or effusions noted.  No bony pathology or digital deformities noted.  Plantarflexed fifth metatarsal head right foot.    Skin  normotropic skin with no porokeratosis noted bilaterally.  No signs of infections or ulcers noted.   Callus sub 5th right  Onychomycosis  Pain in right toes  Pain in left toes  Callus sub 5th right foot.  Consent was obtained for treatment procedures.   Mechanical debridement of nails 1-5  bilaterally performed with a nail nipper.  Filed with dremel without incident. No infection or ulcer.  Porokeratosis debrided with #15 blade and dremel tool.  No incidents noted.   Return office visit    3 months                  Told patient to return for periodic foot care and evaluation due to potential at risk complications.   Gardiner Barefoot DPM

## 2020-01-19 LAB — BLOOD GAS, VENOUS
Acid-Base Excess: 18.7 mmol/L — ABNORMAL HIGH (ref 0.0–2.0)
Acid-Base Excess: 15 mmol/L — ABNORMAL HIGH (ref 0.0–2.0)
Bicarbonate: 47.4 mmol/L — ABNORMAL HIGH (ref 20.0–28.0)
Bicarbonate: 43.1 mmol/L — ABNORMAL HIGH (ref 20.0–28.0)
O2 Saturation: 36 %
O2 Saturation: 48.6 %
Patient temperature: 37
Patient temperature: 37
pCO2, Ven: 73 mmHg (ref 44.0–60.0)
pCO2, Ven: 68 mmHg — ABNORMAL HIGH (ref 44.0–60.0)
pH, Ven: 7.42 (ref 7.250–7.430)
pH, Ven: 7.41 (ref 7.250–7.430)

## 2020-01-24 ENCOUNTER — Encounter (HOSPITAL_COMMUNITY): Payer: Self-pay

## 2020-01-24 ENCOUNTER — Other Ambulatory Visit: Payer: Self-pay

## 2020-01-24 ENCOUNTER — Ambulatory Visit (HOSPITAL_COMMUNITY)
Admission: RE | Admit: 2020-01-24 | Discharge: 2020-01-24 | Disposition: A | Discharge status: Home / Self Care | Payer: PPO | Source: Ambulatory Visit | Attending: Cardiology | Admitting: Cardiology

## 2020-01-24 VITALS — BP 144/88 | HR 88 | Wt 161.2 lb

## 2020-01-24 DIAGNOSIS — E785 Hyperlipidemia, unspecified: Secondary | ICD-10-CM | POA: Diagnosis not present

## 2020-01-24 DIAGNOSIS — Z801 Family history of malignant neoplasm of trachea, bronchus and lung: Secondary | ICD-10-CM | POA: Diagnosis not present

## 2020-01-24 DIAGNOSIS — K219 Gastro-esophageal reflux disease without esophagitis: Secondary | ICD-10-CM | POA: Insufficient documentation

## 2020-01-24 DIAGNOSIS — I2721 Secondary pulmonary arterial hypertension: Secondary | ICD-10-CM | POA: Insufficient documentation

## 2020-01-24 DIAGNOSIS — J449 Chronic obstructive pulmonary disease, unspecified: Secondary | ICD-10-CM | POA: Diagnosis not present

## 2020-01-24 DIAGNOSIS — E1140 Type 2 diabetes mellitus with diabetic neuropathy, unspecified: Secondary | ICD-10-CM | POA: Insufficient documentation

## 2020-01-24 DIAGNOSIS — E1122 Type 2 diabetes mellitus with diabetic chronic kidney disease: Secondary | ICD-10-CM | POA: Diagnosis not present

## 2020-01-24 DIAGNOSIS — I5032 Chronic diastolic (congestive) heart failure: Secondary | ICD-10-CM

## 2020-01-24 DIAGNOSIS — M1990 Unspecified osteoarthritis, unspecified site: Secondary | ICD-10-CM | POA: Diagnosis not present

## 2020-01-24 DIAGNOSIS — I272 Pulmonary hypertension, unspecified: Secondary | ICD-10-CM

## 2020-01-24 DIAGNOSIS — N184 Chronic kidney disease, stage 4 (severe): Secondary | ICD-10-CM | POA: Diagnosis not present

## 2020-01-24 DIAGNOSIS — Z87891 Personal history of nicotine dependence: Secondary | ICD-10-CM | POA: Insufficient documentation

## 2020-01-24 DIAGNOSIS — Z7951 Long term (current) use of inhaled steroids: Secondary | ICD-10-CM | POA: Diagnosis not present

## 2020-01-24 DIAGNOSIS — I130 Hypertensive heart and chronic kidney disease with heart failure and stage 1 through stage 4 chronic kidney disease, or unspecified chronic kidney disease: Secondary | ICD-10-CM | POA: Insufficient documentation

## 2020-01-24 DIAGNOSIS — Z833 Family history of diabetes mellitus: Secondary | ICD-10-CM | POA: Diagnosis not present

## 2020-01-24 DIAGNOSIS — I50810 Right heart failure, unspecified: Secondary | ICD-10-CM | POA: Insufficient documentation

## 2020-01-24 DIAGNOSIS — F329 Major depressive disorder, single episode, unspecified: Secondary | ICD-10-CM | POA: Diagnosis not present

## 2020-01-24 DIAGNOSIS — K589 Irritable bowel syndrome without diarrhea: Secondary | ICD-10-CM | POA: Diagnosis not present

## 2020-01-24 DIAGNOSIS — Z794 Long term (current) use of insulin: Secondary | ICD-10-CM | POA: Insufficient documentation

## 2020-01-24 DIAGNOSIS — I350 Nonrheumatic aortic (valve) stenosis: Secondary | ICD-10-CM | POA: Insufficient documentation

## 2020-01-24 DIAGNOSIS — Z7982 Long term (current) use of aspirin: Secondary | ICD-10-CM | POA: Insufficient documentation

## 2020-01-24 DIAGNOSIS — J9611 Chronic respiratory failure with hypoxia: Secondary | ICD-10-CM | POA: Insufficient documentation

## 2020-01-24 DIAGNOSIS — E1151 Type 2 diabetes mellitus with diabetic peripheral angiopathy without gangrene: Secondary | ICD-10-CM | POA: Insufficient documentation

## 2020-01-24 DIAGNOSIS — Z79899 Other long term (current) drug therapy: Secondary | ICD-10-CM | POA: Diagnosis not present

## 2020-01-24 DIAGNOSIS — I2510 Atherosclerotic heart disease of native coronary artery without angina pectoris: Secondary | ICD-10-CM | POA: Insufficient documentation

## 2020-01-24 DIAGNOSIS — Z8041 Family history of malignant neoplasm of ovary: Secondary | ICD-10-CM | POA: Diagnosis not present

## 2020-01-24 LAB — BASIC METABOLIC PANEL
Anion gap: 12 (ref 5–15)
BUN: 26 mg/dL — ABNORMAL HIGH (ref 8–23)
CO2: 35 mmol/L — ABNORMAL HIGH (ref 22–32)
Calcium: 9.7 mg/dL (ref 8.9–10.3)
Chloride: 92 mmol/L — ABNORMAL LOW (ref 98–111)
Creatinine, Ser: 2.28 mg/dL — ABNORMAL HIGH (ref 0.44–1.00)
GFR calc Af Amer: 25 mL/min — ABNORMAL LOW (ref >60)
GFR calc non Af Amer: 22 mL/min — ABNORMAL LOW (ref >60)
Glucose, Bld: 155 mg/dL — ABNORMAL HIGH (ref 70–99)
Potassium: 3.9 mmol/L (ref 3.5–5.1)
Sodium: 139 mmol/L (ref 135–145)

## 2020-01-24 MED ORDER — TORSEMIDE 20 MG PO TABS: 60.0000 mg | ORAL_TABLET | Freq: Every day | ORAL | 1 refills | Status: DC

## 2020-01-24 NOTE — Patient Instructions (Signed)
Labs done today. We will contact you only if your labs are abnormal.  INCREASE Torsemide 94m(3 tablets) by mouth daily.  No other medication changes were made. Please continue all other medications as prescribed.   REMEMBER to elevate your legs to prevent swelling throughout the day.   Your physician recommends that you schedule a follow-up appointment in: 7-10 days for a lab only visit and in 4-6 weeks with Dr. BHaroldine Laws  Do the following things EVERYDAY: 1) Weigh yourself in the morning before breakfast. Write it down and keep it in a log. 2) Take your medicines as prescribed 3) Eat low salt foods-Limit salt (sodium) to 2000 mg per day.  4) Stay as active as you can everyday 5) Limit all fluids for the day to less than 2 liters   [facility] you and your health needs are our priority. As part of our continuing mission to provide you with exceptional heart care, we have created designated Provider Care Teams. These Care Teams include your primary Cardiologist (physician) and Advanced Practice Providers (APPs- Physician Assistants and Nurse Practitioners) who all work together to provide you with the care you need, when you need it.   You may see any of the following providers on your designated Care Team at your next follow up: .Marland KitchenDr DGlori Bickers. Dr DLoralie Champagne. ADarrick Grinder NP . BLyda Jester PA . LAudry Riles PharmD   Please be sure to bring in all your medications bottles to every appointment.

## 2020-01-24 NOTE — Progress Notes (Signed)
Patient ID: Lindsay Harmon, female   DOB: 07-23-1956, 64 y.o.   MRN: [id number]    Advanced Heart Failure Clinic Note   PCP: Dr Silvio Pate  Primary HF  Cardiologist: Dr Haroldine Laws Pulmonary: [name]   HPI: Lindsay Harmon is a 64 y.o. female with history of HTN, DM, IBS, depression, GERD, severe COPD (FEV1 0.87L)  and pulmonary hypertension with RV failure/cor pulmonale, and chronic diastolic heart failure.   Admitted in 8/16 with volume overload and severe hyponatremia. Diuresed with IV lasix and transtioned back torsemide and metolazone as needed. Discharge weight 153 pounds.   Admitted to [facility] in 12/16 with volume depletion and hyponatremia with Na 113. Hydrated. Has since followed up with Dr. Anthonette Legato in [place]. Now on torsemide 20 bid and occasional metolazone.   Admitted to [facility] 6/23-6/28 with acute hypoxic respiratory failure and AKI. Thought to be related to severe pulmonary HTN. Sildenfil was stopped. ACE was stopped due to hypotension. Torsemide was also stopped. Weaned to 4 L nasal cannula. Palliative was consulted.   She has been followed by Dr. Lake Bells. At one point was on Sildenafil for pulmonary hypertension.  Last seen by Dr. Haroldine Laws in 2019. At that time, she was felt to be volume overloaded w/ increased dyspnea. Torsemide was restarted. Sildenafil was stopped as it was thought to be worsening her shunting. She was started on Tyvaso. Unfortunately, she was lost to f/u.   She presents back to the [facility] for post hospital f/u. Recently admitted by Internal Medicine for acute on chronic diastolic/right sided heart failure. She was diuresed w/ IV lasix w/ symptomatic improvement and placed back on PO torsemide, alternating between 40-60 mg qod. She was continued on Tyvaso. 2D echo showed normal LVEF of 45-80%, grade 1 diastolic dysfunction and severe pulmonary hypertension, PA systolic pressure of 998-338 mmH. Echo also showed progression of her aortic stenosis, now moderate to  severe, mean gradient measures 31.3 mmHg.     [place] 01/22/18 Findings: RA = 11 RV = 92/12 PA = 92/33 (57) PCW = 15 Fick cardiac output/index = 5.9/3.3 PVR = 7.2 WU Ao sat = 93% PA sat = 62%, 64%   RHC 12/05/2014 RA 12 mmHg (mean) with O2 sat 72%; RV 97/16 mmHg with O2 sat 73%; PA 97/30 mmHg with O2 sat 67%; PCWP(mean) 14 mmHg (mean); Cardiac Output 5.78 L/min  PFTs  11/2014 with severe COPD  FEV1 0.87 (38%) FVC 1.41 (48%) FEF 25-75 0.41 (19%) Unable to do DLCO  March 2019  Simple spirometry ratio 67% FEV1 1.21 L+ 53% predicted FVC 1.79 L 61% predicted   Echo  11/2014 EF 60-65%, aortic sclerosis, RV moderately dilated/moderately decreased systolic function, PASP 71 mmHg  09/30/2017 LVEF 55-60%, Grade 1 DD, Mild/Mod AS Mean gradient 16 mmHg, Peak PA pressure 115 Hg  01/04/20 Left ventricular ejection fraction, by estimation, is 60 to 65%. The left ventricle has normal function. The left ventricle has no regional wall motion abnormalities. There is mild left ventricular hypertrophy. Left ventricular diastolic parameters are consistent with Grade I diastolic dysfunction (impaired relaxation). There is the interventricular septum is flattened in systole and diastole, consistent with right ventricular pressure and volume overload. 2. There is severe pulmonary hypertension with PA systolic pressure of 250-539 mmHg plus central venous pressure (which exceeds recorded systemic pressure). Right ventricular systolic function is moderately reduced. The right ventricular size is severely enlarged. mildly increased right ventricular wall thickness. 3. Right atrial size was moderately dilated. 4. The mitral valve is  degenerative. No evidence of mitral valve regurgitation. No evidence of mitral stenosis. 5. Tricuspid valve regurgitation is severe. 6. The aortic valve has an indeterminant number of cusps. Aortic valve regurgitation is not visualized. Moderate to severe aortic valve  stenosis. Aortic valve area, by VTI measures 0.60 cm. Aortic valve mean gradient measures 31.3 mmHg. Aortic valve Vmax measures 3.41 m/s.  Review of systems complete and found to be negative unless listed in HPI.    SH:  Social History   Socioeconomic History  . Marital status: Legally Separated    Spouse name: Not on file  . Number of children: 1  . Years of education: Not on file  . Highest education level: Not on file  Occupational History  . 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 Smoking status: Former Smoker    Packs/day: 1.50    Years: 33.00    Pack years: 49.50    Types: Cigarettes    Quit date: 07/28/2005    Years since quitting: 14.5  . Smokeless tobacco: Never Used  Substance and Sexual Activity  . Alcohol use: No    Alcohol/week: 0.0 standard drinks    Comment: heavy in the past  . Drug use: No  . Sexual activity: Never  Other Topics Concern  . Not on file  Social History Narrative   No living will   Son [name] (then mom) should make decisions for her if she is unable.    Would accept resuscitation attempts but no prolonged ventilation   Not sure about tube feeds but probably wouldn't want them if cognitively unaware   Social Determinants of Health   Financial Resource Strain:   . Difficulty of Paying Living Expenses:   Food Insecurity:   . Worried About Charity fundraiser in the Last Year:   . Arboriculturist in the Last Year:   Transportation Needs:   . Film/video editor (Medical):   Marland Kitchen Lack of Transportation (Non-Medical):   Physical Activity:   . Days of Exercise per Week:   . Minutes of Exercise per Session:   Stress:   . Feeling of Stress :   Social Connections:   . Frequency of Communication with Friends and Family:   . Frequency of Social Gatherings with Friends and Family:   . Attends Religious Services:   . Active Member of Clubs or Organizations:   . Attends Archivist  Meetings:   Marland Kitchen Marital Status:   Intimate Partner Violence:   . Fear of Current or Ex-Partner:   . Emotionally Abused:   Marland Kitchen Physically Abused:   . Sexually Abused:    FH:  Family History  Problem Relation Age of Onset  . Diabetes Mother   . Cancer Mother        ovarian,melanoma  . Allergies Father   . Cancer Father        lung  . Cancer Maternal Grandmother        uterine  . Emphysema Paternal Grandfather   . Allergies Sister   . Breast cancer Neg Hx     Past Medical History:  Diagnosis Date  . Allergy   . Aortic atherosclerosis ([place])   . Arthritis   . CKD (chronic kidney disease), stage IV ([place])   . COPD (chronic obstructive pulmonary disease) ([place])    a. 11/2014 PFT's: FEV1 0.87 (38%), FVC 1.41 (48%), FEF 25-75 0.41 (19%). Unable to perform DLCO; b.  12/2017 Simple spirometry ration 67%. FEV1 1.21 L+53% predicted. FVC 1.79L 61% predicted.  . Coronary artery calcification seen on CT scan    a. 03/2017 CT Chest.  . Depression   . Diabetic neuropathy ([place])   . Erythrocytosis 12/13/2019  . Esophageal stricture   . Familial hematuria   . GERD (gastroesophageal reflux disease)   . Hyperlipidemia   . Hypertension   . IBS (irritable bowel syndrome)   . Nephrolithiasis   . NIDDM (non-insulin dependent diabetes mellitus)   . Obesity, unspecified   . Pulmonary hypertension ([place])    a. WHO Group 3; b. 10/2016 Echo: EF 55-60%, no rwma, Gr1 DD, mild to mod AS, mean grad 73mHg, mildly dil RV w/ mildly reduced RV fxn, mildly dil RA. PASP 119mg; c. 12/2017 RHC: RA 11, RV 92/12, PA 92/33(57), PCWP 15, Fick CO/CI 5.9/3.3. PVR 7.2 WU. Ao 93%, PA 62/64%.  . Marland KitchenVD (peripheral vascular disease) ([facility]   Current Outpatient Medications  Medication Sig Dispense Refill  . albuterol (PROVENTIL) (2.5 MG/3ML) 0.083% nebulizer solution USE 1 VIAL (3ML) BY NEBULIZATION EVERY 6HOURS AS NEEDED FOR WHEEZING OR SHORTNESS OF BREATH 360 mL 0  . albuterol (VENTOLIN HFA) 108 (90 Base) MCG/ACT inhaler USE 2  PUFFS EVERY SIX HOURS AS NEEDED FOR WHEEZING OR SHORTNESS OF BREATH 18 g 5  . ALPRAZolam (XANAX) 0.25 MG tablet Take 1 tablet (0.25 mg total) by mouth daily as needed for anxiety. 30 tablet 0  . aspirin EC 81 MG tablet Take 1 tablet (81 mg total) by mouth daily. 90 tablet 3  . calcitRIOL (ROCALTROL) 0.5 MCG capsule Take 0.5 mcg by mouth daily.     . cholecalciferol (VITAMIN D) 1000 units tablet Take 1,000 Units by mouth at bedtime.     . ferrous sulfate 325 (65 FE) MG tablet Take 325 mg by mouth daily with breakfast.    . gabapentin (NEURONTIN) 300 MG capsule Take 1 capsule in the am and 2 at bedtime. 90 capsule 11  . glucose blood (ONE TOUCH ULTRA TEST) test strip USE 1 STRIP TO TEST BLOOD SUGAR ONCE DAILY Dx Code E11.51 100 each 3  . Insulin Pen Needle (B-D UF III MINI PEN NEEDLES) 31G X 5 MM MISC USE 1 PEN NEEDLE TO INJECT INSULIN 100 each 5  . LANTUS SOLOSTAR 100 UNIT/ML Solostar Pen INJECT 20 UNITS INTO THE SKIN AT BEDTIME (Patient taking differently: Inject 20 Units into the skin daily. ) 15 mL 0  . loratadine (CLARITIN) 10 MG tablet Take 10 mg by mouth 2 (two) times daily.    . Marland Kitchenovastatin (MEVACOR) 40 MG tablet Take 2 tablets (80 mg total) by mouth at bedtime. 180 tablet 3  . methadone (DOLOPHINE) 10 MG tablet Take 2 tablets (20 mg total) by mouth every 6 (six) hours as needed. (Patient taking differently: Take 20 mg by mouth in the morning, at noon, in the evening, and at bedtime. ) 240 tablet 0  . metoprolol tartrate (LOPRESSOR) 25 MG tablet Take 0.5 tablets (12.5 mg total) by mouth 2 (two) times daily. 90 tablet 3  . mometasone (ASMANEX, 120 METERED DOSES,) 220 MCG/INH inhaler Inhale 2 puffs into the lungs 2 (two) times daily. 1 Inhaler 6  . nitroGLYCERIN (NITROSTAT) 0.4 MG SL tablet Place 1 tablet (0.4 mg total) under the tongue every 5 (five) minutes as needed for chest pain. 100 tablet 1  . pantoprazole (PROTONIX) 40 MG tablet Take 1 tablet (40 mg total) by mouth 2 (two) times daily.  180  tablet 3  . Respiratory Therapy Supplies (FLUTTER) DEVI Use as directed 1 each 0  . STIOLTO RESPIMAT 2.5-2.5 MCG/ACT AERS INHALE 2 PUFFS BY MOUTH INTO THE LUNGS DAILY. 4 g 3  . torsemide (DEMADEX) 20 MG tablet Take 2 tablets (40 mg total) by mouth daily. Take 40 mg ( 2 tablet ) on M,W,F and 60 MG ( 3 tablets) on Tu, th, sat and sun. 180 tablet 3  . Treprostinil (TYVASO) 0.6 MG/ML SOLN Inhale 18 mcg into the lungs 4 (four) times daily.     No current facility-administered medications for this encounter.   Vitals:   01/24/20 1105  BP: (!) 144/88  Pulse: 88  SpO2: (!) 88%  Weight: 73.1 kg (161 lb 3.2 oz)   Wt Readings from Last 3 Encounters:  01/24/20 73.1 kg (161 lb 3.2 oz)  01/11/20 71.6 kg (157 lb 12.8 oz)  01/04/20 70.6 kg (155 lb 9.6 oz)    PHYSICAL EXAM: General:  Chronically ill appearing, WF, looks much older than actual age. On [employer] but no increased respiratory difficulty HEENT: normal Neck: supple. no JVD. Carotids 2+ bilat; no bruits. No lymphadenopathy or thyromegaly appreciated. Cor: PMI nondisplaced. Regular rate & rhythm. 3/6 systolic murmur, loudest at RUSB  Lungs: decreases BS at the bases bilaterally  Abdomen: soft, nontender, nondistended. No hepatosplenomegaly. No bruits or masses. Good bowel sounds. Extremities: no cyanosis, clubbing, rash, 1+ bilateral edema Neuro: alert & oriented x 3, cranial nerves grossly intact. moves all 4 extremities w/o difficulty. Affect pleasant.    ASSESSMENT & PLAN: 1. Chronic Diastolic HF/ RV Failure  - Echo 01/04/20: EF 60-65%. RV severely enlarged w/ moderately reduced systolic function w/ severe pulmonary hypertension with PA systolic pressure of 263-785 mmH - volume overloaded w/ 1+ bilateral LEE, no increased respiratory difficulty c/w R>L sided heart failure - Will increase torsemide to 60 mg daily (currently alternating between 40-60 mg qod). Check BMP today and again in 7 days.   - unfortunately, due to PAD, unable to use TED  hoses nor unna boots for LEE. She was advised to elevated legs above level of heart frequently to help w/ edema  - Reinforced importance of fluid restriction and low sodium diet    2. PAH with cor pulmonale   - Peak PA pressure 92 on [facility]HC 01/22/18, has been chronically high.  - recent 2D echo w/ severe pulmonary hypertension with PA systolic pressure of 885-027 mmH - likely Who Group 3. Not candidate for selective pulmonary vasodilators - Primarily WHO Group 3 with chronic hypoxic lung disease, but PA pressures out of proportion to her reduction in FEV1. Suspect component of FEV1. - No longer on sildenfail - there was concern for shunting, so will keep her off for now. - Continue Tyvaso    3. Severe COPD/ Chronic Hypoxic Respiratory Failure - on chronic supp O2 - FVC 1.79 (61%), FEV1 1.21 (53%)  - no change   4. CKD IV - baseline SCr ~1.8-2.2 - Check BMP today and again in 7 days w/ diuretic increase   5. Aortic Stenosis - recent echo showed disease progression, now moderate to severe, Mean gradient 33.  - Not a surgical candidate given multiple co morbidities including chronic respiratory failure and severe RV failure. May consider TAVR but not sure it would result in much symptomatic improvement/ improvement in quality of life. She has had recent exacerbation of HF, but no angina or syncope.    F/u with Dr. Haroldine Laws again in 4  weeks.    Lyda Jester, PA-C  11:22 AM

## 2020-01-25 ENCOUNTER — Other Ambulatory Visit: Payer: Self-pay | Admitting: *Deleted

## 2020-01-25 ENCOUNTER — Telehealth: Payer: Self-pay | Admitting: *Deleted

## 2020-01-25 MED ORDER — METHADONE HCL 10 MG PO TABS: 20.0000 mg | ORAL_TABLET | Freq: Four times a day (QID) | ORAL | 0 refills | Status: DC | PRN

## 2020-01-25 NOTE — Telephone Encounter (Signed)
Patient left a voicemail requesting a refill   Name of Medication: Methadone Name of Pharmacy: [facility] or Written Date and Quantity: 12/03/19 #240 Last Office Visit and Type: 10/27/19 acute Next Office Visit and Type: 02/02/20 Last Controlled Substance Agreement Date:11/16/17  Last UDS: 09/27/19

## 2020-01-25 NOTE — Telephone Encounter (Signed)
Elisa from [facility] left a voicemail stating that they received a script for Methadone 10 mg. Elisa stated that since the script is written for as needed they need to know how many days # 240 pills will last the patient. Elisa requested a call back with this information. When leaving the response the first and last name of caller is required per the state of [place].

## 2020-01-26 NOTE — Telephone Encounter (Signed)
VM left with Dr Alla German clarification message

## 2020-01-26 NOTE — Telephone Encounter (Signed)
She takes up to 8 a day---so that is theoretically a 30 days supply. Occasionally it lasts longer (it used to be written as 2 four times daiy without the prn, but she tries to take less if she can)

## 2020-02-02 ENCOUNTER — Encounter: Payer: Self-pay | Admitting: Internal Medicine

## 2020-02-02 ENCOUNTER — Other Ambulatory Visit: Payer: Self-pay

## 2020-02-02 ENCOUNTER — Ambulatory Visit (INDEPENDENT_AMBULATORY_CARE_PROVIDER_SITE_OTHER): Payer: PPO | Admitting: Internal Medicine

## 2020-02-02 DIAGNOSIS — Z0000 Encounter for general adult medical examination without abnormal findings: Secondary | ICD-10-CM

## 2020-02-02 DIAGNOSIS — Z7189 Other specified counseling: Secondary | ICD-10-CM

## 2020-02-02 DIAGNOSIS — I739 Peripheral vascular disease, unspecified: Secondary | ICD-10-CM | POA: Diagnosis not present

## 2020-02-02 DIAGNOSIS — E1151 Type 2 diabetes mellitus with diabetic peripheral angiopathy without gangrene: Secondary | ICD-10-CM | POA: Diagnosis not present

## 2020-02-02 DIAGNOSIS — N184 Chronic kidney disease, stage 4 (severe): Secondary | ICD-10-CM | POA: Diagnosis not present

## 2020-02-02 DIAGNOSIS — F39 Unspecified mood [affective] disorder: Secondary | ICD-10-CM | POA: Diagnosis not present

## 2020-02-02 DIAGNOSIS — I272 Pulmonary hypertension, unspecified: Secondary | ICD-10-CM

## 2020-02-02 DIAGNOSIS — D692 Other nonthrombocytopenic purpura: Secondary | ICD-10-CM

## 2020-02-02 DIAGNOSIS — E1165 Type 2 diabetes mellitus with hyperglycemia: Secondary | ICD-10-CM | POA: Diagnosis not present

## 2020-02-02 DIAGNOSIS — I5032 Chronic diastolic (congestive) heart failure: Secondary | ICD-10-CM

## 2020-02-02 DIAGNOSIS — F1120 Opioid dependence, uncomplicated: Secondary | ICD-10-CM

## 2020-02-02 DIAGNOSIS — IMO0002 Reserved for concepts with insufficient information to code with codable children: Secondary | ICD-10-CM

## 2020-02-02 NOTE — Progress Notes (Signed)
Subjective:    Patient ID: Lindsay Harmon, female    DOB: 02-03-1956, 64 y.o.   MRN: [id number]  HPI Here for Medicare wellness visit and follow up of chronic health conditions This visit occurred during the SARS-CoV-2 public health emergency.  Safety protocols were in place, including screening questions prior to the visit, additional usage of staff PPE, and extensive cleaning of exam room while observing appropriate contact time as indicated for disinfecting solutions.   Reviewed form and advanced directives Reviewed other doctors No alcohol or tobacco Not able to exercise No falls Vision and hearing are okay No depression or anhedonia Independent with instrumental ADLs Mild memory issues--loses train of thought easily or forgets names  Feels better from recent hospital stay Exacerbation of CHF Was diuresed and felt better Monitoring weight at home--- 156-159# Breathing back to baseline sats okay---on continuous oxygen No chest pain or palpitations No dizziness or syncope No leg pain Torsemide is up to 3 per day  Sugars okay Frequently under 100 No clinical hypoglycemic reactions Chronic kidney disease ---keeps up with Dr Holley Raring (every 3 months)  Bruises easily Constant ecchymoses --and seem to be getting worse Only on ASA  Back pain persists Can sometimes get by with 2 methadone twice a day but needs it more on some bad days  Current Outpatient Medications on File Prior to Visit  Medication Sig Dispense Refill  . albuterol (PROVENTIL) (2.5 MG/3ML) 0.083% nebulizer solution USE 1 VIAL (3ML) BY NEBULIZATION EVERY 6HOURS AS NEEDED FOR WHEEZING OR SHORTNESS OF BREATH 360 mL 0  . albuterol (VENTOLIN HFA) 108 (90 Base) MCG/ACT inhaler USE 2 PUFFS EVERY SIX HOURS AS NEEDED FOR WHEEZING OR SHORTNESS OF BREATH 18 g 5  . ALPRAZolam (XANAX) 0.25 MG tablet Take 1 tablet (0.25 mg total) by mouth daily as needed for anxiety. 30 tablet 0  . aspirin EC 81 MG tablet Take 1 tablet  (81 mg total) by mouth daily. 90 tablet 3  . calcitRIOL (ROCALTROL) 0.5 MCG capsule Take 0.5 mcg by mouth daily.     . cholecalciferol (VITAMIN D) 1000 units tablet Take 1,000 Units by mouth at bedtime.     . ferrous sulfate 325 (65 FE) MG tablet Take 325 mg by mouth daily with breakfast.    . gabapentin (NEURONTIN) 300 MG capsule Take 1 capsule in the am and 2 at bedtime. 90 capsule 11  . glucose blood (ONE TOUCH ULTRA TEST) test strip USE 1 STRIP TO TEST BLOOD SUGAR ONCE DAILY Dx Code E11.51 100 each 3  . Insulin Pen Needle (B-D UF III MINI PEN NEEDLES) 31G X 5 MM MISC USE 1 PEN NEEDLE TO INJECT INSULIN 100 each 5  . LANTUS SOLOSTAR 100 UNIT/ML Solostar Pen INJECT 20 UNITS INTO THE SKIN AT BEDTIME (Patient taking differently: Inject 20 Units into the skin daily. ) 15 mL 0  . loratadine (CLARITIN) 10 MG tablet Take 10 mg by mouth 2 (two) times daily.    Marland Kitchen lovastatin (MEVACOR) 40 MG tablet Take 2 tablets (80 mg total) by mouth at bedtime. 180 tablet 3  . methadone (DOLOPHINE) 10 MG tablet Take 2 tablets (20 mg total) by mouth every 6 (six) hours as needed. 240 tablet 0  . metoprolol tartrate (LOPRESSOR) 25 MG tablet Take 0.5 tablets (12.5 mg total) by mouth 2 (two) times daily. 90 tablet 3  . mometasone (ASMANEX, 120 METERED DOSES,) 220 MCG/INH inhaler Inhale 2 puffs into the lungs 2 (two) times daily. 1 Inhaler 6  .  nitroGLYCERIN (NITROSTAT) 0.4 MG SL tablet Place 1 tablet (0.4 mg total) under the tongue every 5 (five) minutes as needed for chest pain. 100 tablet 1  . pantoprazole (PROTONIX) 40 MG tablet Take 1 tablet (40 mg total) by mouth 2 (two) times daily. 180 tablet 3  . Respiratory Therapy Supplies (FLUTTER) DEVI Use as directed 1 each 0  . STIOLTO RESPIMAT 2.5-2.5 MCG/ACT AERS INHALE 2 PUFFS BY MOUTH INTO THE LUNGS DAILY. 4 g 3  . torsemide (DEMADEX) 20 MG tablet Take 3 tablets (60 mg total) by mouth daily. 270 tablet 1  . Treprostinil (TYVASO) 0.6 MG/ML SOLN Inhale 18 mcg into the lungs 4  (four) times daily.     No current facility-administered medications on file prior to visit.    Allergies  Allergen Reactions  . Sertraline Hcl Other (See Comments)    Altered Mental Status    Past Medical History:  Diagnosis Date  . Allergy   . Aortic atherosclerosis ([place])   . Arthritis   . CKD (chronic kidney disease), stage IV ([place])   . COPD (chronic obstructive pulmonary disease) ([place])    a. 11/2014 PFT's: FEV1 0.87 (38%), FVC 1.41 (48%), FEF 25-75 0.41 (19%). Unable to perform DLCO; b. 12/2017 Simple spirometry ration 67%. FEV1 1.21 L+53% predicted. FVC 1.79L 61% predicted.  . Coronary artery calcification seen on CT scan    a. 03/2017 CT Chest.  . Depression   . Diabetic neuropathy ([place])   . Erythrocytosis 12/13/2019  . Esophageal stricture   . Familial hematuria   . GERD (gastroesophageal reflux disease)   . Hyperlipidemia   . Hypertension   . IBS (irritable bowel syndrome)   . Nephrolithiasis   . NIDDM (non-insulin dependent diabetes mellitus)   . Obesity, unspecified   . Pulmonary hypertension ([place])    a. WHO Group 3; b. 10/2016 Echo: EF 55-60%, no rwma, Gr1 DD, mild to mod AS, mean grad 44mHg, mildly dil RV w/ mildly reduced RV fxn, mildly dil RA. PASP 1121mg; c. 12/2017 RHC: RA 11, RV 92/12, PA 92/33(57), PCWP 15, Fick CO/CI 5.9/3.3. PVR 7.2 WU. Ao 93%, PA 62/64%.  . Marland KitchenVD (peripheral vascular disease) ([facility] [place]    Past Surgical History:  Procedure Laterality Date  . ABDOMINAL HYSTERECTOMY    . CARDIAC CATHETERIZATION    . CARPAL TUNNEL RELEASE    . CESAREAN SECTION    . ERCP N/A 03/17/2017   Procedure: ENDOSCOPIC RETROGRADE CHOLANGIOPANCREATOGRAPHY (ERCP);  Surgeon: WoLucilla LameMD;  [facility];  Service: Endoscopy;  Laterality: N/A;  . EUS N/A 03/13/2017   Procedure: FULL UPPER ENDOSCOPIC ULTRASOUND (EUS) RADIAL;  Surgeon: Burbridge, ReMurray HodgkinsMD;  Location: [facility];  Service: Endoscopy;  Laterality: N/A;  . RIGHT HEART CATH N/A 01/22/2018    Procedure: RIGHT HEART CATH;  Surgeon: BeJolaine ArtistMD;  [facility];  Service: Cardiovascular;  Laterality: N/A;  . RIGHT HEART CATHETERIZATION N/A 12/05/2014   Procedure: RIGHT HEART CATH;  Surgeon: HeSinclair GroomsMD;  [facility];  Service: Cardiovascular;  Laterality: N/A;    Family History  Problem Relation Age of Onset  . Diabetes Mother   . Cancer Mother        ovarian,melanoma  . Allergies Father   . Cancer Father        lung  . Cancer Maternal Grandmother        uterine  . Emphysema Paternal Grandfather   . Allergies Sister   .  Breast cancer Neg Hx     Social History   Socioeconomic History  . Marital status: Legally Separated    Spouse name: Not on file  . Number of children: 1  . Years of education: Not on file  . Highest education level: Not on file  Occupational History  . 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 Smoking status: Former Smoker    Packs/day: 1.50    Years: 33.00    Pack years: 49.50    Types: Cigarettes    Quit date: 07/28/2005    Years since quitting: 14.5  . Smokeless tobacco: Never Used  Substance and Sexual Activity  . Alcohol use: No    Alcohol/week: 0.0 standard drinks    Comment: heavy in the past  . Drug use: No  . Sexual activity: Never  Other Topics Concern  . Not on file  Social History Narrative   No living will   Son [name] (then mom) should make decisions for her if she is unable.    Would accept resuscitation attempts but no prolonged ventilation   Not sure about tube feeds but probably wouldn't want them if cognitively unaware   Social Determinants of Health   Financial Resource Strain:   . Difficulty of Paying Living Expenses:   Food Insecurity:   . Worried About Charity fundraiser in the Last Year:   . Arboriculturist in the Last Year:   Transportation Needs:   . Film/video editor (Medical):   Marland Kitchen Lack of Transportation  (Non-Medical):   Physical Activity:   . Days of Exercise per Week:   . Minutes of Exercise per Session:   Stress:   . Feeling of Stress :   Social Connections:   . Frequency of Communication with Friends and Family:   . Frequency of Social Gatherings with Friends and Family:   . Attends Religious Services:   . Active Member of Clubs or Organizations:   . Attends Archivist Meetings:   Marland Kitchen Marital Status:   Intimate Partner Violence:   . Fear of Current or Ex-Partner:   . Emotionally Abused:   Marland Kitchen Physically Abused:   . Sexually Abused:    Review of Systems Appetite is okay Sleeps well Wears seat belt No heartburn or dysphagia Bowels move fine--no blood No problems voiding. No incontinence No suspicious skin lesions Only a few teeth left--has top denture. Doesn't see dentist often    Objective:   Physical Exam  Constitutional: She is oriented to person, place, and time. She appears well-developed. No distress.  HENT:  Upper plate Few lower incisors No oral lesions  Neck: No thyromegaly present.  Cardiovascular: Normal rate and regular rhythm. Exam reveals no gallop.  No pedal pulses IH4/7 systolic murmur along left sternal border  Respiratory: Effort normal and breath sounds normal. No respiratory distress. She has no wheezes. She has no rales.  GI: Soft. There is no abdominal tenderness.  Musculoskeletal:        General: No edema.  Lymphadenopathy:    She has no cervical adenopathy.  Neurological: She is alert and oriented to person, place, and time.  President--- "Zoila Shutter, ?" [phone] [id number] Recall 3/3  Skin:  Ecchymotic lesions all over arms Feet are cool and purplish discoloration  Psychiatric: She has a normal mood and affect. Her behavior is normal.           Assessment & Plan:

## 2020-02-02 NOTE — Assessment & Plan Note (Signed)
Continues on the tyvaso with reasonable control

## 2020-02-02 NOTE — Assessment & Plan Note (Signed)
Recent exacerbation improved Increased diuretic now Home weight stable

## 2020-02-02 NOTE — Assessment & Plan Note (Signed)
Keeps up with Dr Holley Raring

## 2020-02-02 NOTE — Assessment & Plan Note (Signed)
Mostly on arms Only on aspirin---but given her vascular disease, will continue this

## 2020-02-02 NOTE — Assessment & Plan Note (Signed)
Lab Results  Component Value Date   HGBA1C 8.3 (H) 01/03/2020   This is acceptable for her--especially since fingersticks often under 100

## 2020-02-02 NOTE — Progress Notes (Signed)
Hearing Screening   Method: Audiometry   125Hz 250Hz 500Hz 1000Hz 2000Hz 3000Hz 4000Hz 6000Hz 8000Hz  Right ear:   _0 Left ear:   _1 Vision Screening Comments: October 2020

## 2020-02-02 NOTE — Assessment & Plan Note (Signed)
Mild dysthymia at times No medications indicated

## 2020-02-02 NOTE — Assessment & Plan Note (Signed)
See social history

## 2020-02-02 NOTE — Assessment & Plan Note (Signed)
No pulses but no pain No action for now

## 2020-02-02 NOTE — Assessment & Plan Note (Signed)
For the chronic back pain PDMP reviewed --no concerns

## 2020-02-02 NOTE — Assessment & Plan Note (Signed)
I have personally reviewed the Medicare Annual Wellness questionnaire and have noted 1. The patient's medical and social history 2. Their use of alcohol, tobacco or illicit drugs 3. Their current medications and supplements 4. The patient's functional ability including ADL's, fall risks, home safety risks and hearing or visual             impairment. 5. Diet and physical activities 6. Evidence for depression or mood disorders  The patients weight, height, BMI and visual acuity have been recorded in the chart I have made referrals, counseling and provided education to the patient based review of the above and I have provided the pt with a written personalized care plan for preventive services.  I have provided you with a copy of your personalized plan for preventive services. Please take the time to review along with your updated medication list.  Avoiding cancer screening due to her overall status Flu vaccine in the fall Try to stay as active as she can tolerate

## 2020-02-08 ENCOUNTER — Telehealth: Payer: Self-pay | Admitting: Internal Medicine

## 2020-02-08 NOTE — Chronic Care Management (AMB) (Signed)
  Chronic Care Management   Note  02/08/2020 Name: JULIO STORR MRN: [id number] DOB: 12-29-1955  ANIHYA TUMA is a 64 y.o. year old female who is a primary care patient of Letvak, Theophilus Kinds, MD. I reached out to [name] by phone today in response to a referral sent by Ms. Alain Marion Sianez's PCP, Venia Carbon, MD.   Ms. Douthitt was given information about Chronic Care Management services today including:  1. CCM service includes personalized support from designated clinical staff supervised by her physician, including individualized plan of care and coordination with other care providers 2. 24/7 contact phone numbers for assistance for urgent and routine care needs. 3. Service will only be billed when office clinical staff spend 20 minutes or more in a month to coordinate care. 4. Only one practitioner may furnish and bill the service in a calendar month. 5. The patient may stop CCM services at any time (effective at the end of the month) by phone call to the office staff.   Patient agreed to services and verbal consent obtained.   Follow up plan:   Raynicia Dukes [employer] Scheduler

## 2020-02-11 ENCOUNTER — Other Ambulatory Visit: Payer: Self-pay

## 2020-02-11 ENCOUNTER — Inpatient Hospital Stay: Payer: PPO

## 2020-02-11 ENCOUNTER — Inpatient Hospital Stay: Payer: PPO | Attending: Oncology

## 2020-02-11 DIAGNOSIS — D751 Secondary polycythemia: Secondary | ICD-10-CM

## 2020-02-11 LAB — HEMOGLOBIN AND HEMATOCRIT, BLOOD
HCT: 48.8 % — ABNORMAL HIGH (ref 36.0–46.0)
Hemoglobin: 15.7 g/dL — ABNORMAL HIGH (ref 12.0–15.0)

## 2020-02-15 ENCOUNTER — Other Ambulatory Visit (HOSPITAL_COMMUNITY): Payer: Self-pay | Admitting: *Deleted

## 2020-02-15 MED ORDER — TORSEMIDE 20 MG PO TABS: 60.0000 mg | ORAL_TABLET | Freq: Every day | ORAL | 1 refills | Status: DC

## 2020-02-21 ENCOUNTER — Other Ambulatory Visit: Payer: Self-pay | Admitting: Internal Medicine

## 2020-02-24 ENCOUNTER — Encounter: Payer: Self-pay | Admitting: Podiatry

## 2020-02-24 ENCOUNTER — Ambulatory Visit: Payer: PPO | Admitting: Podiatry

## 2020-02-24 ENCOUNTER — Other Ambulatory Visit: Payer: Self-pay

## 2020-02-24 ENCOUNTER — Ambulatory Visit (INDEPENDENT_AMBULATORY_CARE_PROVIDER_SITE_OTHER): Payer: PPO

## 2020-02-24 VITALS — Temp 97.3°F

## 2020-02-24 DIAGNOSIS — M778 Other enthesopathies, not elsewhere classified: Secondary | ICD-10-CM | POA: Diagnosis not present

## 2020-02-24 DIAGNOSIS — Q828 Other specified congenital malformations of skin: Secondary | ICD-10-CM | POA: Diagnosis not present

## 2020-02-24 DIAGNOSIS — I739 Peripheral vascular disease, unspecified: Secondary | ICD-10-CM | POA: Diagnosis not present

## 2020-02-24 DIAGNOSIS — M779 Enthesopathy, unspecified: Secondary | ICD-10-CM

## 2020-02-24 NOTE — Progress Notes (Signed)
.  .  This patient presents the office with chief complaint of a red swollen painful right foot.  She says this area is painful on the top of her foot above the callus on the outside border of her right foot.  She presents the office today with no evidence of any redness swelling or inflammation noted to her right foot.  Patient does have reoccurrence of a callus developing under the outside ball of her right foot.  She presents the office today for an evaluation and treatment of this painful callus. Patient has a history of chronic kidney disease and diabetes with vascular complications and diabetic neuropathy.  She presents the office today for an evaluation and treatment of this painful area.  General Appearance  Alert, conversant and in no acute stress.  Vascular  Dorsalis pedis and posterior tibial  pulses are not  palpable  bilaterally.  Capillary return is diminished   Bilaterally  Cold feet noted  B/L.  Red inflamed anterior aspect of both legs noted.  The feet also are reddish in color.  Neurologic  Senn-Weinstein monofilament wire test diminished/absent  ilaterally. Muscle power within normal limits bilaterally.  Nails Thick disfigured discolored nails with subungual debris  from hallux to fifth toes bilaterally. No evidence of bacterial infection or drainage bilaterally.  Orthopedic  No limitations of motion  feet .  No crepitus or effusions noted.  No bony pathology or digital deformities noted.  Plantar flexed fifth metatarsal right foot.  Skin  normotropic skin with no porokeratosis noted bilaterally.  No signs of infections or ulcers noted.  Callus sub 5th right foot.   Redness persists right foot but no evidence of fluctuance streaking or drainage noted.    Callus secondary to plantar flexed fifth metatarsal right foot.  ROV.  Debride callus with # 15 blade. Xrays reveal no bony pathology.    I am concerned about the pain in the quick return of the callus on the right foot.  I am  concerned she may be susceptible to an ulcer which will then not be able to heal due to her circulatory status.  Therefore I asked her to be seen at the vascular doctor for an evaluation prior to ulcer formation on her right foot.  Padding was applied both to her foot as well as to her shoe.  Upon leaving the office she said there is no evidence of any pain .  Patient was told to return to the office at her previously scheduled preventative foot care services appointment.   Gardiner Barefoot DPM

## 2020-02-28 ENCOUNTER — Telehealth: Payer: Self-pay

## 2020-02-28 DIAGNOSIS — I10 Essential (primary) hypertension: Secondary | ICD-10-CM

## 2020-02-28 DIAGNOSIS — IMO0002 Reserved for concepts with insufficient information to code with codable children: Secondary | ICD-10-CM

## 2020-02-28 DIAGNOSIS — E1151 Type 2 diabetes mellitus with diabetic peripheral angiopathy without gangrene: Secondary | ICD-10-CM

## 2020-02-28 NOTE — Telephone Encounter (Signed)
Per written referral from PCP, requesting referral in Epic for BRAIDYN PEACE to chronic care management pharmacy services for the following conditions:   Essential hypertension, benign  [I10]  DM (diabetes mellitus), type 2, uncontrolled, periph vascular complic [D82.64, B58.30]  Debbora Dus, PharmD Clinical Pharmacist [facility] [phone]

## 2020-02-29 ENCOUNTER — Other Ambulatory Visit: Payer: Self-pay

## 2020-02-29 ENCOUNTER — Encounter: Payer: Self-pay | Admitting: Cardiovascular Disease

## 2020-02-29 ENCOUNTER — Ambulatory Visit (INDEPENDENT_AMBULATORY_CARE_PROVIDER_SITE_OTHER): Payer: PPO | Admitting: Cardiovascular Disease

## 2020-02-29 VITALS — BP 138/78 | HR 78 | Ht 61.0 in | Wt 161.5 lb

## 2020-02-29 DIAGNOSIS — I5032 Chronic diastolic (congestive) heart failure: Secondary | ICD-10-CM

## 2020-02-29 DIAGNOSIS — E785 Hyperlipidemia, unspecified: Secondary | ICD-10-CM

## 2020-02-29 DIAGNOSIS — I739 Peripheral vascular disease, unspecified: Secondary | ICD-10-CM | POA: Diagnosis not present

## 2020-02-29 NOTE — Progress Notes (Signed)
Cardiology Office Note   Date:  02/29/2020   ID:  Lindsay Harmon, DOB Nov 10, 1955, MRN [id number]  PCP:  Venia Carbon, MD  Cardiologist:   Kathlyn Sacramento, MD   Chief Complaint  Patient presents with  . OTHER    C/o sensitive feet and lower right leg pain when standing. Meds reviewed verbally with pt.      History of Present Illness: Lindsay Harmon is a 64 y.o. female who is here today for follow-up visit regarding peripheral arterial disease. She has known history of essential hypertension, diabetes mellitus, irritable bowel syndrome, previous tobacco use, depression, GERD, severe COPD, severe pulmonary hypertension with RV failure, moderate to severe aortic stenosis, carotid disease status post left carotid endarterectomy, chronic kidney disease with GFR of 22 and chronic diastolic heart failure.  She is followed by the heart failure clinic with Dr. Haroldine Laws.  She was seen last year for bilateral leg claudication worse on the left side with chronic discoloration of both feet.  No ulceration. She underwent recent noninvasive vascular studies which showed an ABI of 0.78 on the right and 0.73 on the left.  Duplex showed moderate diffuse right SFA disease and occluded mid left SFA.  She is limited by significant dyspnea and uses 4 or 5 L of oxygen.    She was hospitalized in March with heart failure exacerbation.  She has been complaining of increased discomfort affecting her right leg and foot.  That area seems to be sensitive.  She also has a callus on the bottom of the foot but no open ulceration.    Past Medical History:  Diagnosis Date  . Allergy   . Aortic atherosclerosis ([place])   . Arthritis   . CKD (chronic kidney disease), stage IV ([place])   . COPD (chronic obstructive pulmonary disease) ([place])    a. 11/2014 PFT's: FEV1 0.87 (38%), FVC 1.41 (48%), FEF 25-75 0.41 (19%). Unable to perform DLCO; b. 12/2017 Simple spirometry ration 67%. FEV1 1.21 L+53% predicted. FVC 1.79L 61%  predicted.  . Coronary artery calcification seen on CT scan    a. 03/2017 CT Chest.  . Depression   . Diabetic neuropathy ([place])   . Erythrocytosis 12/13/2019  . Esophageal stricture   . Familial hematuria   . GERD (gastroesophageal reflux disease)   . Hyperlipidemia   . Hypertension   . IBS (irritable bowel syndrome)   . Nephrolithiasis   . NIDDM (non-insulin dependent diabetes mellitus)   . Obesity, unspecified   . Pulmonary hypertension ([place])    a. WHO Group 3; b. 10/2016 Echo: EF 55-60%, no rwma, Gr1 DD, mild to mod AS, mean grad 25mHg, mildly dil RV w/ mildly reduced RV fxn, mildly dil RA. PASP 1115mg; c. 12/2017 RHC: RA 11, RV 92/12, PA 92/33(57), PCWP 15, Fick CO/CI 5.9/3.3. PVR 7.2 WU. Ao 93%, PA 62/64%.  . Marland KitchenVD (peripheral vascular disease) [place]    Past Surgical History:  Procedure Laterality Date  . ABDOMINAL HYSTERECTOMY    . CARDIAC CATHETERIZATION    . CARPAL TUNNEL RELEASE    . CESAREAN SECTION    . ERCP N/A 03/17/2017   Procedure: ENDOSCOPIC RETROGRADE CHOLANGIOPANCREATOGRAPHY (ERCP);  Surgeon: WoLucilla LameMD;  [facility];  Service: Endoscopy;  Laterality: N/A;  . EUS N/A 03/13/2017   Procedure: FULL UPPER ENDOSCOPIC ULTRASOUND (EUS) RADIAL;  Surgeon: Burbridge, ReMurray HodgkinsMD;  Location: [facility] ENDOSCOPY;  Service: Endoscopy;  Laterality: N/A;  . RIGHT HEART CATH N/A 01/22/2018  Procedure: RIGHT HEART CATH;  Surgeon: Jolaine Artist, MD;  Location: [place] CV LAB;  Service: Cardiovascular;  Laterality: N/A;  . RIGHT HEART CATHETERIZATION N/A 12/05/2014   Procedure: RIGHT HEART CATH;  Surgeon: Sinclair Grooms, MD;  Location[facility] C[facility] LAB;  Service: Cardiovascular;  Laterality: N/A;     Current Outpatient Medications  Medication Sig Dispense Refill  . albuterol (PROVENTIL) (2.5 MG/3ML) 0.083% nebulizer solution USE 1 VIAL (3ML) BY NEBULIZATION EVERY 6HOURS AS NEEDED FOR WHEEZING OR SHORTNESS OF BREATH 360 mL 0  . albuterol (VENTOLIN HFA) 108 (90  Base) MCG/ACT inhaler USE 2 PUFFS EVERY SIX HOURS AS NEEDED FOR WHEEZING OR SHORTNESS OF BREATH 18 g 5  . ALPRAZolam (XANAX) 0.25 MG tablet Take 1 tablet (0.25 mg total) by mouth daily as needed for anxiety. 30 tablet 0  . aspirin EC 81 MG tablet Take 1 tablet (81 mg total) by mouth daily. 90 tablet 3  . calcitRIOL (ROCALTROL) 0.5 MCG capsule Take 0.5 mcg by mouth daily.     . cholecalciferol (VITAMIN D) 1000 units tablet Take 1,000 Units by mouth at bedtime.     . ferrous sulfate 325 (65 FE) MG tablet Take 325 mg by mouth daily with breakfast.    . gabapentin (NEURONTIN) 300 MG capsule Take 1 capsule in the am and 2 at bedtime. 90 capsule 11  . glucose blood (ONE TOUCH ULTRA TEST) test strip USE 1 STRIP TO TEST BLOOD SUGAR ONCE DAILY Dx Code E11.51 100 each 3  . Insulin Pen Needle (B-D UF III MINI PEN NEEDLES) 31G X 5 MM MISC USE 1 PEN NEEDLE TO INJECT INSULIN 100 each 5  . LANTUS SOLOSTAR 100 UNIT/ML Solostar Pen INJECT 20 UNITS INTO THE SKIN AT BEDTIME 15 mL 5  . loratadine (CLARITIN) 10 MG tablet Take 10 mg by mouth 2 (two) times daily.    Marland Kitchen lovastatin (MEVACOR) 40 MG tablet Take 2 tablets (80 mg total) by mouth at bedtime. 180 tablet 3  . methadone (DOLOPHINE) 10 MG tablet Take 2 tablets (20 mg total) by mouth every 6 (six) hours as needed. 240 tablet 0  . metoprolol tartrate (LOPRESSOR) 25 MG tablet Take 0.5 tablets (12.5 mg total) by mouth 2 (two) times daily. 90 tablet 3  . mometasone (ASMANEX, 120 METERED DOSES,) 220 MCG/INH inhaler Inhale 2 puffs into the lungs 2 (two) times daily. 1 Inhaler 6  . nitroGLYCERIN (NITROSTAT) 0.4 MG SL tablet Place 1 tablet (0.4 mg total) under the tongue every 5 (five) minutes as needed for chest pain. 100 tablet 1  . pantoprazole (PROTONIX) 40 MG tablet Take 1 tablet (40 mg total) by mouth 2 (two) times daily. 180 tablet 3  . Respiratory Therapy Supplies (FLUTTER) DEVI Use as directed 1 each 0  . STIOLTO RESPIMAT 2.5-2.5 MCG/ACT AERS INHALE 2 PUFFS BY MOUTH  INTO THE LUNGS DAILY. 4 g 3  . torsemide (DEMADEX) 20 MG tablet Take 3 tablets (60 mg total) by mouth daily. 270 tablet 1  . Treprostinil (TYVASO) 0.6 MG/ML SOLN Inhale 18 mcg into the lungs 4 (four) times daily.     No current facility-administered medications for this visit.    Allergies:   Sertraline hcl    Social History:  The patient  reports that she quit smoking about 14 years ago. Her smoking use included cigarettes. She has a 49.50 pack-year smoking history. She has never used smokeless tobacco. She reports that she does not drink alcohol or use drugs.  Family History:  The patient's family history includes Allergies in her father and sister; Cancer in her father, maternal grandmother, and mother; Diabetes in her mother; Emphysema in her paternal grandfather.    ROS:  Please see the history of present illness.   Otherwise, review of systems are positive for none.   All other systems are reviewed and negative.    PHYSICAL EXAM: VS:  BP 138/78 (BP Location: Right Arm, Patient Position: Sitting, Cuff Size: Normal)   Pulse 78   Ht _0  (1.549 m)   Wt 161 lb 8 oz (73.3 kg)   SpO2 94%   BMI 30.52 kg/m  , BMI Body mass index is 30.52 kg/m. GEN: Frail looking female in no acute distress.  On supplemental oxygen. HEENT: normal  Neck: no JVD.  Positive carotid bruits Cardiac: RRR; no  rubs, or gallops.  3 out of 6 crescendo decrescendo systolic murmur in the aortic area which is late peaking with diminished S2.  There is trace bilateral leg edema Respiratory:   Diminished breath sounds bilaterally GI: soft, nontender, nondistended, + BS MS: no deformity or atrophy  Skin: warm and dry, no rash Neuro:  Strength and sensation are intact Psych: euthymic mood, full affect Vascular: Distal pulses are not palpable.  She has  dependent rubor but she also has significant varicose veins with chronic discoloration.   EKG:  EKG is ordered today. The ekg ordered today demonstrates normal  sinus rhythm with biatrial enlargement and nonspecific ST and T wave changes.   Recent Labs: 06/17/2019: Pro B Natriuretic peptide (BNP) 962.0 10/27/2019: ALT 22 01/03/2020: B Natriuretic Peptide 1,855.0; TSH 1.613 01/04/2020: Magnesium 1.9; Platelets 183 01/24/2020: BUN 26; Creatinine, Ser 2.28; Potassium 3.9; Sodium 139 02/11/2020: Hemoglobin 15.7    Lipid Panel    Component Value Date/Time   CHOL 143 01/03/2020 2232   TRIG 59 01/03/2020 2232   HDL 58 01/03/2020 2232   CHOLHDL 2.5 01/03/2020 2232   VLDL 12 01/03/2020 2232   LDLCALC 73 01/03/2020 2232   LDLDIRECT 65.0 10/23/2016 1044      Wt Readings from Last 3 Encounters:  02/29/20 161 lb 8 oz (73.3 kg)  02/02/20 158 lb (71.7 kg)  01/24/20 161 lb 3.2 oz (73.1 kg)        PAD Screen 03/23/2019  Previous PAD dx? No  Previous surgical procedure? No  Pain with walking? No  Subsides with rest? No  Feet/toe relief with dangling? No  Painful, non-healing ulcers? No  Extremities discolored? Yes      ASSESSMENT AND PLAN:  1.  Peripheral arterial disease: Moderately reduced ABI bilaterally with bilateral SFA disease.  The patient likely has progression of peripheral arterial disease since last year with increased discoloration and more discomfort affecting the right leg.  I am going to repeat lower extremity arterial Doppler but I am afraid that she is very high risk for any angiography or intervention given underlying cardiopulmonary disease and also advanced chronic kidney disease with high chance of contrast-induced nephropathy which would be detrimental considering her other comorbidities.  We will follow her closely and reserve angiography for critical limb ischemia.  2.  Severe COPD with pulmonary hypertension: Followed by Dr. Haroldine Laws and Dr. Lake Bells.  3.  Chronic diastolic heart failure: She seems to be mildly volume overloaded on current dose of torsemide.  The swelling in the right leg might be causing some of the  discomfort.  4.  Hyperlipidemia: Continue treatment with lovastatin.  Most recent LDL was  54.    Disposition:   FU with me in 3 months  Signed,  Kathlyn Sacramento, MD  02/29/2020 4:32 PM    [place]

## 2020-02-29 NOTE — Telephone Encounter (Signed)
Referral created.

## 2020-02-29 NOTE — Patient Instructions (Signed)
Medication Instructions:  Your physician recommends that you continue on your current medications as directed. Please refer to the Current Medication list given to you today.  *If you need a refill on your cardiac medications before your next appointment, please call your pharmacy*   Lab Work: None ordered If you have labs (blood work) drawn today and your tests are completely normal, you will receive your results only by: Marland Kitchen MyChart Message (if you have MyChart) OR . A paper copy in the mail If you have any lab test that is abnormal or we need to change your treatment, we will call you to review the results.   Testing/Procedures: Your physician has requested that you have a lower extremity arterial exercise duplex. During this test, exercise and ultrasound are used to evaluate arterial blood flow in the legs. Allow one hour for this exam. There are no restrictions or special instructions.    Follow-Up: At [facility], you and your health needs are our priority.  As part of our continuing mission to provide you with exceptional heart care, we have created designated Provider Care Teams.  These Care Teams include your primary Cardiologist (physician) and Advanced Practice Providers (APPs -  Physician Assistants and Nurse Practitioners) who all work together to provide you with the care you need, when you need it.  We recommend signing up for the patient portal called "MyChart".  Sign up information is provided on this After Visit Summary.  MyChart is used to connect with patients for Virtual Visits (Telemedicine).  Patients are able to view lab/test results, encounter notes, upcoming appointments, etc.  Non-urgent messages can be sent to your provider as well.   To learn more about what you can do with MyChart, go to [email or web address].    Your next appointment:   3 month(s)  The format for your next appointment:   In Person  Provider:    You may see Dr. Fletcher Anon or one of the  following Advanced Practice Providers on your designated Care Team:    Murray Hodgkins, NP  Christell Faith, PA-C  Marrianne Mood, PA-C    Other Instructions N/A

## 2020-03-02 ENCOUNTER — Other Ambulatory Visit: Payer: Self-pay

## 2020-03-02 ENCOUNTER — Ambulatory Visit: Payer: PPO

## 2020-03-02 DIAGNOSIS — I5032 Chronic diastolic (congestive) heart failure: Secondary | ICD-10-CM

## 2020-03-02 DIAGNOSIS — IMO0002 Reserved for concepts with insufficient information to code with codable children: Secondary | ICD-10-CM

## 2020-03-02 DIAGNOSIS — E1151 Type 2 diabetes mellitus with diabetic peripheral angiopathy without gangrene: Secondary | ICD-10-CM

## 2020-03-02 NOTE — Chronic Care Management (AMB) (Signed)
Chronic Care Management Pharmacy  Name: Lindsay Harmon  MRN: [id number] DOB: December 23, 1955  Chief Complaint/ HPI  Lindsay Harmon,  64 y.o., female presents for their Initial CCM visit with the clinical pharmacist via telephone.  PCP : Venia Carbon, MD   Their chronic conditions include: type 2 diabetes, heart failure, hypertension, PVD, COPD, GERD, chronic renal disease, episodic mood disorder, chronic back pain, pulmonary hypertension  Patient concerns: denies medication concerns/questions  Office Visits:  02/02/20: Silvio Pate - cont current meds  Consult Visit:  02/29/20: Cardiology - PAD progressing, repeat LE arterial doppler, CHF exacerbation March 2021 - current mild volume overload  01/11/20: Pulmonary - recent hospitalization for hypoxemic resp. failure, improving, cont current meds, rtc 3 months  01/03/20: ED - dyspnea  12/10/19: Pulmonary HTN - try flutter valve for congestion/loosen secretions  Allergies  Allergen Reactions  . Sertraline Hcl Other (See Comments)    Altered Mental Status    Medications: Outpatient Encounter Medications as of 03/02/2020  Medication Sig  . albuterol (PROVENTIL) (2.5 MG/3ML) 0.083% nebulizer solution USE 1 VIAL (3ML) BY NEBULIZATION EVERY 6HOURS AS NEEDED FOR WHEEZING OR SHORTNESS OF BREATH  . albuterol (VENTOLIN HFA) 108 (90 Base) MCG/ACT inhaler USE 2 PUFFS EVERY SIX HOURS AS NEEDED FOR WHEEZING OR SHORTNESS OF BREATH  . ALPRAZolam (XANAX) 0.25 MG tablet Take 1 tablet (0.25 mg total) by mouth daily as needed for anxiety.  Marland Kitchen aspirin EC 81 MG tablet Take 1 tablet (81 mg total) by mouth daily.  . calcitRIOL (ROCALTROL) 0.5 MCG capsule Take 0.5 mcg by mouth daily.   . cholecalciferol (VITAMIN D) 1000 units tablet Take 1,000 Units by mouth at bedtime.   . ferrous sulfate 325 (65 FE) MG tablet Take 325 mg by mouth daily with breakfast.  . gabapentin (NEURONTIN) 300 MG capsule Take 1 capsule in the am and 2 at bedtime.  Marland Kitchen glucose blood (ONE  TOUCH ULTRA TEST) test strip USE 1 STRIP TO TEST BLOOD SUGAR ONCE DAILY Dx Code E11.51  . Insulin Pen Needle (B-D UF III MINI PEN NEEDLES) 31G X 5 MM MISC USE 1 PEN NEEDLE TO INJECT INSULIN  . LANTUS SOLOSTAR 100 UNIT/ML Solostar Pen INJECT 20 UNITS INTO THE SKIN AT BEDTIME  . loratadine (CLARITIN) 10 MG tablet Take 10 mg by mouth 2 (two) times daily.  Marland Kitchen lovastatin (MEVACOR) 40 MG tablet Take 2 tablets (80 mg total) by mouth at bedtime.  . methadone (DOLOPHINE) 10 MG tablet Take 2 tablets (20 mg total) by mouth every 6 (six) hours as needed.  . metoprolol tartrate (LOPRESSOR) 25 MG tablet Take 0.5 tablets (12.5 mg total) by mouth 2 (two) times daily.  . mometasone (ASMANEX, 120 METERED DOSES,) 220 MCG/INH inhaler Inhale 2 puffs into the lungs 2 (two) times daily.  . nitroGLYCERIN (NITROSTAT) 0.4 MG SL tablet Place 1 tablet (0.4 mg total) under the tongue every 5 (five) minutes as needed for chest pain.  . pantoprazole (PROTONIX) 40 MG tablet Take 1 tablet (40 mg total) by mouth 2 (two) times daily.  Marland Kitchen Respiratory Therapy Supplies (FLUTTER) DEVI Use as directed  . STIOLTO RESPIMAT 2.5-2.5 MCG/ACT AERS INHALE 2 PUFFS BY MOUTH INTO THE LUNGS DAILY.  Marland Kitchen torsemide (DEMADEX) 20 MG tablet Take 3 tablets (60 mg total) by mouth daily.  . Treprostinil (TYVASO) 0.6 MG/ML SOLN Inhale 18 mcg into the lungs 4 (four) times daily.   No facility-administered encounter medications on file as of 03/02/2020.   Current Diagnosis/Assessment:  SDOH:  Financial Resource Strain: Low Risk   . Difficulty of Paying Living Expenses: Not very hard   Goals Addressed            This Visit's Progress   . Pharmacy Care Plan       CARE PLAN ENTRY  Current Barriers:  . Chronic Disease Management support, education, and care coordination needs related to Diabetes and Heart Failure   Heart Failure . Pharmacist Clinical Goal(s): o Over the next 3 months, patient will work with PharmD and providers to prevent fluid  overload and heart failure exacerbations . Current regimen:   Metoprolol tartrate 25 mg - 1/2 tablet twice daily  Torsemide 20 mg - 3 tablets daily . Interventions: o Continue current medications . Patient self care activities - Over the next 3 months, patient will: o Continue to check weight daily, monitoring for excess fluid. Call if weight gain of 3 pounds over night or 5 pounds in 1 week.  Diabetes . Pharmacist Clinical Goal(s): o Over the next 3 months, patient will work with PharmD and providers to achieve A1c goal <8% . Current regimen:  o Lantus - Inject 16 units qAM . Interventions: o Continue current medications . Patient self care activities - Over the next 3 months, patient will: o Check blood sugar once daily, document, and provide at future appointments; Once your morning glucose readings are more stable, may try checking at other times of day (before or after meals and bedtime). o Contact provider with any episodes of hypoglycemia  Nerve Pain: . Pharmacist Clinical Goal(s) o Over the next 3 months, patient will work with PharmD and providers to improve nerve pain . Current regimen:  o Gabapentin 300 mg - 1 capsule in the morning, 2 capsules at bedtime  . Interventions: o Discussed over the counter topical treatments . Patient self care activities - Over the next 3 months, patient will: o May try over the counter topical cream with lidocaine such as Aspercreme. Follow directions on the box and do not apply if any cuts or open wounds.   Additional recommendations:  - Recommend Shingrix (2-dose) vaccine from pharmacy  Initial goal documentation      Hypertension   CMP Latest Ref Rng & Units 01/24/2020 01/04/2020 01/03/2020  Glucose 70 - 99 mg/dL 155(H) 175(H) 141(H)  BUN 8 - 23 mg/dL 26(H) 27(H) 26(H)  Creatinine 0.44 - 1.00 mg/dL 2.28(H) 1.86(H) 1.97(H)  Sodium 135 - 145 mmol/L 139 141 139  Potassium 3.5 - 5.1 mmol/L 3.9 3.8 3.0(L)  Chloride 98 - 111 mmol/L  92(L) 93(L) 92(L)  CO2 22 - 32 mmol/L 35(H) 34(H) 35(H)  Calcium 8.9 - 10.3 mg/dL 9.7 9.7 9.1  Total Protein 6.0 - 8.3 g/dL - - -  Total Bilirubin 0.2 - 1.2 mg/dL - - -  Alkaline Phos 39 - 117 U/L - - -  AST 0 - 37 U/L - - -  ALT 0 - 35 U/L - - -   Office blood pressures are: BP Readings from Last 3 Encounters:  02/29/20 138/78  02/02/20 116/78  01/24/20 (!) 144/88   Patient has failed these meds in the past: none reported Patient checks BP at home when feeling symptomatic Patient home BP readings are ranging: none reported  Patient is currently controlled on the following medications:   Metoprolol tartrate 25 mg - 1/2 tablet BID   Torsemide 20 mg - 3 tablets daily   Plan: Continue current medications  Hyperlipidemia   Lipid Panel  Component Value Date/Time   CHOL 143 01/03/2020 2232   TRIG 59 01/03/2020 2232   HDL 58 01/03/2020 2232   CHOLHDL 2.5 01/03/2020 2232   VLDL 12 01/03/2020 2232   LDLCALC 73 01/03/2020 2232   LDLDIRECT 65.0 10/23/2016 1044   The 10-year ASCVD risk score Mikey Bussing DC Jr., et al., 2013) is: 14.9%   Values used to calculate the score:     Age: 8 years     Sex: Female     Is Non-Hispanic African American: No     Diabetic: Yes     Tobacco smoker: No     Systolic Blood Pressure: 818 mmHg     Is BP treated: Yes     HDL Cholesterol: 58 mg/dL     Total Cholesterol: 143 mg/dL   CBC Latest Ref Rng & Units 02/11/2020 01/10/2020 01/04/2020  WBC 4.0 - 10.5 K/uL - - 9.7  Hemoglobin 12.0 - 15.0 g/dL 15.7(H) 16.1(H) 15.9(H)  Hematocrit 36.0 - 46.0 % 48.8(H) 49.8(H) 49.6(H)  Platelets 150 - 400 K/uL - - 183   LDL goal < 100 Patient has failed these meds in past: none reported Patient is currently controlled on the following medications:   Aspirin 81 mg - 1 tablet daily  Lovastatin 40 mg - 1 tablet daily  We discussed: confirmed adherence; CBC stable  Plan: Continue current medications  COPD/ Tobacco   Last spirometry score: 02/20 - FEV1  57% Gold Grade: Gold 2 (FEV1 50-79%)  Eosinophil count:   Lab Results  Component Value Date/Time   EOSPCT 1 12/13/2019 09:40 AM  %                               Eos (Absolute):  Lab Results  Component Value Date/Time   EOSABS 0.1 12/13/2019 09:40 AM   Tobacco Status:  Social History   Tobacco Use  Smoking Status Former Smoker  . Packs/day: 1.50  . Years: 33.00  . Pack years: 49.50  . Types: Cigarettes  . Quit date: 07/28/2005  . Years since quitting: 14.6  Smokeless Tobacco Never Used   Oxygen is usually 93-94% Patient has failed these meds in past: none  Patient is currently controlled on the following medications:   Albuterol HFA 90 mcg - PRN  Albuterol nebulizer - 2 puffs every 4-6 hours PRN  Stiolto 2.5-2.5 mcg - 2 puffs daily  Mometasone 220 mcg - 2 puffs BID  Flutter device (green device) - phlegm, cough and congestion  Supplemental oxygen - 24/7  Using maintenance inhaler regularly? Yes Frequency of rescue inhaler use: nebulizer - rare occasions; albuterol inhaler 1-2x per week; confirmed appropriate inhaler technique Reports less SOB lately and much improved since hospitalization in March 2021.  Plan: Continue current medications  Diabetes   Recent Relevant Labs: Lab Results  Component Value Date/Time   HGBA1C 8.3 (H) 01/03/2020 10:32 PM   HGBA1C 8.8 (H) 09/27/2019 03:29 PM   HGBA1C 7.0 03/19/2019 12:00 AM   MICROALBUR 1939m/g creat 12/12/2015 12:00 AM   MICROALBUR 0.8 09/08/2012 09:33 AM    A1c goal < 8% Checking BG: Daily Recent FBG Readings: 77, 102, 109, 78, 64 (reduced insulin to 18 units), 71 (reduced insulin 16 units - yesterday), 149 (today) Hypoglycemia: takes 1-2 glucose tabs  Patient has failed these meds in past: none reported - renal impairment (CrCL 28 ml/min) Patient is currently uncontrolled on the following medications:   Lantus - 16 units  qAM  Last diabetic eye exam:  Lab Results  Component Value Date/Time    HMDIABEYEEXA No Retinopathy 08/24/2019 12:00 AM    Last diabetic foot exam:  Lab Results  Component Value Date/Time   HMDIABFOOTEX done 09/18/2018 12:00 AM   Diet: denies any change in appetite or diet; eats breakfast daily, snack at lunch (PB crackers, fruit) and supper; eats at home mostly - love vegetables, pork or chicken, baked potato, green peas; denies bedtime snacking; drinks - water, tea with artificial sweetener  Assessment: Reports fasting BG lower lately. Pt self-reduced insulin by 2 units the past 2 days; This is appropriate due to multiple readings in low 70s. We discussed she may increase back to 18 units if BG remains near 150 as it was today. Due to renal impairment and frequent lows, targeting higher A1c goal of < 8%.  Plan: Continue current medications Follow up in 3 months to assess.  Heart Failure   Type: Diastolic Last ejection fraction: 01/04/20: 60-65% NYHA Class: II (slight limitation of activity) AHA HF Stage: C (Heart disease and symptoms present)  Patient has failed these meds in past: none reported Patient is currently controlled on the following medications:   Metoprolol tartrate 25 mg - 1/2 tablet BID   Torsemide 20 mg - 3 tablets daily  We discussed: confirmed adherence, legs and feet swollen/painful lately, but stabl - raises feet when sitting, reports her legs are not swollen in the mornings, but worsens over the course of the day; weight is not fluctuating; checks every morning, pt able to state weight cut points for fluid overload (3 lbs in 24 hours, 5 lbs in 1 week) Exercise: minimal due to back pain  Plan: Continue current medications   Chronic Pain   Symptoms: reports pain is fairly constant, primarily in lower back/right hip Patient has failed these meds in past: none  Patient is currently controlled on the following medications:   Gabapentin 300 mg - 1 capsule AM, 2 capsules bedtime  Methadone 10 mg - 2 tablets q6h PRN  CrCl: 28 ml/min  (Scr 2.28, Wt 73 kg) We discussed: methadone - takes at least twice a day, on bad takes takes TID (pain managed by Dr. Silvio Pate); max gabapentin dose per renal function; reports nerve pain (burning/tingling in lower legs and right foot) seems worse lately; denies any OTC pain medications    Plan: Continue current medications; May try over the counter topical cream with lidocaine on feet for nerve pain.   Mood    Patient has failed these meds in past: none reported Patient is currently controlled on the following medications:   Xanax 0.25 mg - 1 tablet daily PRN anxiety  We discussed: reports very rare use of Xanax; cant remember last dose  Plan: Continue current medications   GERD   Patient has failed these meds in past: none Patient is currently controlled on the following medications:   Pantoprazole 40 mg - 1 tablet BID   We discussed: reports history of severe symptoms; denies current symptoms   Plan: Continue current medications  Vaccines   Reviewed and discussed patient's vaccination history.    Immunization History  Administered Date(s) Administered  . Fluad Quad(high Dose 65+) 06/17/2019  . Influenza Split 08/13/2011  . Influenza Whole 09/16/2007, 08/17/2009, 07/31/2010  . Influenza, High Dose Seasonal PF 06/17/2019  . Influenza,inj,Quad PF,6+ Mos 07/12/2013, 08/09/2014, 08/10/2015, 07/23/2016, 07/23/2017, 06/19/2018  . PFIZER SARS-COV-2 Vaccination 12/15/2019, 12/29/2019  . Pneumococcal Conjugate-13 11/08/2014  . Pneumococcal Polysaccharide-23 08/05/2002,  02/27/2016  . Td 05/19/2006  . Tdap 04/26/2017  . Zoster 11/23/2014   Plan: Recommended patient receive Shingrix (would like to get soon, has been uncomfortable going in pharmacy due to [place]).  Medication Management  Misc: Tyvaso inhaler - 7 puffs QID (PAH), calcitriol 0.5 mcg - 1 daily    OTCs: loratadine 10 mg - 1 tablet daily (confirmed),  ferrous sulfate 325 mg - daily with BF (per nephrology), vitamin D  1000 IU - 1 at bedtime (per nephrology)  Pharmacy/Benefits:[facility]/Mail order & [facility] (slowly switching all over to mail)  Adherence: fills weekly pillbox, no concerns  Affordability: medication costs are affordable/has LIS   CCM Follow Up: 8/3/21at 9:00 AM (telephone)   Debbora Dus, PharmD Clinical Pharmacist [facility] [phone]

## 2020-03-02 NOTE — Patient Instructions (Signed)
Mar 02, 2020  Dear Lindsay Harmon,  It was a pleasure meeting you during our initial appointment on Mar 02, 2020. Below is a summary of the goals we discussed and components of chronic care management. Please contact me anytime with questions or concerns.   Visit Information  Goals Addressed            This Visit's Progress   . Pharmacy Care Plan       CARE PLAN ENTRY  Current Barriers:  . Chronic Disease Management support, education, and care coordination needs related to Diabetes and Heart Failure   Heart Failure . Pharmacist Clinical Goal(s): o Over the next 3 months, patient will work with PharmD and providers to prevent fluid overload and heart failure exacerbations . Current regimen:   Metoprolol tartrate 25 mg - 1/2 tablet twice daily  Torsemide 20 mg - 3 tablets daily . Interventions: o Continue current medications . Patient self care activities - Over the next 3 months, patient will: o Continue to check weight daily, monitoring for excess fluid. Call if weight gain of 3 pounds over night or 5 pounds in 1 week.  Diabetes . Pharmacist Clinical Goal(s): o Over the next 3 months, patient will work with PharmD and providers to achieve A1c goal <8% . Current regimen:  o Lantus - Inject 16 units qAM . Interventions: o Continue current medications . Patient self care activities - Over the next 3 months, patient will: o Check blood sugar once daily, document, and provide at future appointments; Once your morning glucose readings are more stable, may try checking at other times of day (before or after meals and bedtime). o Contact provider with any episodes of hypoglycemia  Nerve Pain: . Pharmacist Clinical Goal(s) o Over the next 3 months, patient will work with PharmD and providers to improve nerve pain . Current regimen:  o Gabapentin 300 mg - 1 capsule in the morning, 2 capsules at bedtime  . Interventions: o Discussed over the counter topical  treatments . Patient self care activities - Over the next 3 months, patient will: o May try over the counter topical cream with lidocaine such as Aspercreme. Follow directions on the box and do not apply if any cuts or open wounds.   Additional recommendations:  - Recommend Shingrix (2-dose) vaccine from pharmacy  Initial goal documentation      Lindsay Harmon was given information about Chronic Care Management services today including:  1. CCM service includes personalized support from designated clinical staff supervised by her physician, including individualized plan of care and coordination with other care providers 2. 24/7 contact phone numbers for assistance for urgent and routine care needs. 3. Standard insurance, coinsurance, copays and deductibles apply for chronic care management only during months in which we provide at least 20 minutes of these services. Most insurances cover these services at 100%, however patients may be responsible for any copay, coinsurance and/or deductible if applicable. This service may help you avoid the need for more expensive face-to-face services. 4. Only one practitioner may furnish and bill the service in a calendar month. 5. The patient may stop CCM services at any time (effective at the end of the month) by phone call to the office staff.  Patient agreed to services and verbal consent obtained.   The patient verbalized understanding of instructions provided today and agreed to receive a mailed copy of patient instruction and/or educational materials. Telephone follow up appointment with pharmacy team member scheduled for:  8/3/21at  9:00 AM (telephone)   Debbora Dus, PharmD Clinical Pharmacist [facility] [phone]  Living With Heart Failure  Heart failure is a long-term (chronic) condition in which the heart cannot pump enough blood through the body. When this happens, parts of the body do not get the blood and  oxygen they need. There is no cure for heart failure at this time, so it is important for you to take good care of yourself and follow the treatment plan set by your health care provider. If you are living with heart failure, there are ways to help you manage the disease. Follow these instructions at home: Living with heart failure requires you to make changes in your life. Your health care team will teach you about the changes you need to make in order to relieve your symptoms and lower your risk of going to the hospital. Follow the treatment plan as set by your health care provider. Medicines Medicines are important in reducing your heart's workload, slowing the progression of heart failure, and improving your symptoms.  Take over-the-counter and prescription medicines only as told by your health care provider.  Do not stop taking your medicine unless your health care provider tells you to do that.  Do not skip any dose of your medicine.  Refill prescriptions before you run out of medicine. You need your medicines every day. Eating and drinking   Eat heart-healthy foods. Talk with a dietitian to make an eating plan that is right for you. ? If directed by your health care provider:  Limit salt (sodium). Lowering your sodium intake may reduce symptoms of heart failure. Ask a dietitian to recommend heart-healthy seasonings.  Limit your fluid intake. Fluid restriction may reduce symptoms of heart failure. ? Use low-fat cooking methods instead of frying. Low-fat methods include roasting, grilling, broiling, baking, poaching, steaming, and stir-frying. ? Choose foods that contain no trans fat and are low in saturated fat and cholesterol. Healthy choices include fresh or frozen fruits and vegetables, fish, lean meats, legumes, fat-free or low-fat dairy products, and whole-grain or high-fiber foods.  Limit alcohol intake to no more than 1 drink a day for nonpregnant women and 2 drinks a day for  men. One drink equals 12 oz of beer, 5 oz of wine, or 1 oz of hard liquor. ? Drinking more than that is harmful to your heart. Tell your health care provider if you drink alcohol several times a week. ? Talk with your health care provider about whether any level of alcohol use is safe for you. Activity   Ask your health care provider about attending cardiac rehabilitation. These programs include aerobic physical activity, which provides many benefits for your heart.  If no cardiac rehabilitation program is available, ask your health care provider what aerobic exercises are safe for you to do. Lifestyle Make the lifestyle changes recommended by your health care provider. In general:  Lose weight if your health care provider tells you to do that. Weight loss may reduce symptoms of heart failure.  Do not use any products that contain nicotine or tobacco, such as cigarettes or e-cigarettes. If you need help quitting, ask your health care provider.  Do not use street (illegal) drugs.  Return to your normal activities as told by your health care provider. Ask your health care provider what activities are safe for you. General instructions   Make sure you weigh yourself every day to track your weight. Rapid weight gain may indicate an  increase in fluid in your body and may increase the workload of your heart. ? Weigh yourself every morning. Do this after you urinate but before you eat breakfast. ? Wear the same type of clothing, without shoes, each time you weigh yourself. ? Weigh yourself on the same scale and in the same spot each time.  Living with chronic heart failure often leads to emotions such as fear, stress, anxiety, and depression. If you feel any of these emotions and need help coping, contact your health care provider. Other ways to get help include: ? Talking to friends and family members about your condition. They can give you support and guidance. Explain your symptoms to them  and, if comfortable, invite them to attend appointments or rehabilitation with you. ? Joining a support group for people with chronic heart failure. Talking with other people who have the same symptoms may give you new ways of coping with your disease and your emotions.  Stay up to date with your shots (vaccines). Staying current on pneumococcal and influenza vaccines is especially important in preventing germs from attacking your airways (respiratory infections).  Keep all follow-up visits as told by your health care provider. This is important. How to recognize changes in your condition You and your family members need to know what changes to watch for in your condition. Watch for the following changes and report them to your health care provider:  Sudden weight gain. Ask your health care provider what amount of weight gain to report.  Shortness of breath: ? Feeling short of breath while at rest, with no exercise or activity that required great effort. ? Feeling breathless with activity.  Swelling of your lower legs or ankles.  Difficulty sleeping: ? You wake up feeling short of breath. ? You have to use more pillows to raise your head in order to sleep.  Frequent, dry, hacking cough.  Loss of appetite.  Feeling more tired all the time.  Depression or feelings of sadness or hopelessness.  Bloating in the stomach. Where to find more information  Local support groups. Ask your health care provider about groups near you.  The American Heart Association: www.heart.org Contact a health care provider if:  You have a rapid weight gain.  You have increasing shortness of breath that is unusual for you.  You are unable to participate in your usual physical activities.  You tire easily.  You cough more than normal, especially with physical activity.  You have any swelling or more swelling in areas such as your hands, feet, ankles, or abdomen.  You feel like your heart is  beating quickly (palpitations).  You become dizzy or light-headed when you stand up. Get help right away if:  You have difficulty breathing.  You notice or your family notices a change in your awareness, such as having trouble staying awake or having difficulty with concentration.  You have pain or discomfort in your chest.  You have an episode of fainting (syncope). Summary  There is no cure for heart failure, so it is important for you to take good care of yourself and follow the treatment plan set by your health care provider.  Medicines are important in reducing your heart's workload, slowing the progression of heart failure, and improving your symptoms.  Living with chronic heart failure often leads to emotions such as fear, stress, anxiety, and depression. If you are feeling any of these emotions and need help coping, contact your health care provider. This information is not  intended to replace advice given to you by your health care provider. Make sure you discuss any questions you have with your health care provider. Document Revised: 09/26/2017 Document Reviewed: 02/26/2017 Elsevier Patient Education  2020 [employer].

## 2020-03-08 ENCOUNTER — Encounter (HOSPITAL_COMMUNITY): Payer: Self-pay | Admitting: Internal Medicine

## 2020-03-08 ENCOUNTER — Other Ambulatory Visit: Payer: Self-pay

## 2020-03-08 ENCOUNTER — Ambulatory Visit (HOSPITAL_COMMUNITY)
Admission: RE | Admit: 2020-03-08 | Discharge: 2020-03-08 | Disposition: A | Discharge status: Home / Self Care | Payer: PPO | Source: Ambulatory Visit | Attending: Internal Medicine | Admitting: Internal Medicine

## 2020-03-08 VITALS — BP 116/72 | HR 92 | Wt 161.8 lb

## 2020-03-08 DIAGNOSIS — I130 Hypertensive heart and chronic kidney disease with heart failure and stage 1 through stage 4 chronic kidney disease, or unspecified chronic kidney disease: Secondary | ICD-10-CM | POA: Diagnosis not present

## 2020-03-08 DIAGNOSIS — I272 Pulmonary hypertension, unspecified: Secondary | ICD-10-CM | POA: Diagnosis not present

## 2020-03-08 DIAGNOSIS — E1122 Type 2 diabetes mellitus with diabetic chronic kidney disease: Secondary | ICD-10-CM | POA: Insufficient documentation

## 2020-03-08 DIAGNOSIS — I2510 Atherosclerotic heart disease of native coronary artery without angina pectoris: Secondary | ICD-10-CM | POA: Diagnosis not present

## 2020-03-08 DIAGNOSIS — F329 Major depressive disorder, single episode, unspecified: Secondary | ICD-10-CM | POA: Diagnosis not present

## 2020-03-08 DIAGNOSIS — Z87891 Personal history of nicotine dependence: Secondary | ICD-10-CM | POA: Diagnosis not present

## 2020-03-08 DIAGNOSIS — J449 Chronic obstructive pulmonary disease, unspecified: Secondary | ICD-10-CM | POA: Diagnosis not present

## 2020-03-08 DIAGNOSIS — K589 Irritable bowel syndrome without diarrhea: Secondary | ICD-10-CM | POA: Diagnosis not present

## 2020-03-08 DIAGNOSIS — Z794 Long term (current) use of insulin: Secondary | ICD-10-CM | POA: Insufficient documentation

## 2020-03-08 DIAGNOSIS — Z7982 Long term (current) use of aspirin: Secondary | ICD-10-CM | POA: Diagnosis not present

## 2020-03-08 DIAGNOSIS — Z79899 Other long term (current) drug therapy: Secondary | ICD-10-CM | POA: Insufficient documentation

## 2020-03-08 DIAGNOSIS — Z833 Family history of diabetes mellitus: Secondary | ICD-10-CM | POA: Insufficient documentation

## 2020-03-08 DIAGNOSIS — I5032 Chronic diastolic (congestive) heart failure: Secondary | ICD-10-CM | POA: Diagnosis not present

## 2020-03-08 DIAGNOSIS — E1151 Type 2 diabetes mellitus with diabetic peripheral angiopathy without gangrene: Secondary | ICD-10-CM | POA: Diagnosis not present

## 2020-03-08 DIAGNOSIS — E669 Obesity, unspecified: Secondary | ICD-10-CM | POA: Insufficient documentation

## 2020-03-08 DIAGNOSIS — Z7951 Long term (current) use of inhaled steroids: Secondary | ICD-10-CM | POA: Insufficient documentation

## 2020-03-08 DIAGNOSIS — K219 Gastro-esophageal reflux disease without esophagitis: Secondary | ICD-10-CM | POA: Insufficient documentation

## 2020-03-08 DIAGNOSIS — E785 Hyperlipidemia, unspecified: Secondary | ICD-10-CM | POA: Insufficient documentation

## 2020-03-08 DIAGNOSIS — I350 Nonrheumatic aortic (valve) stenosis: Secondary | ICD-10-CM | POA: Insufficient documentation

## 2020-03-08 DIAGNOSIS — E1140 Type 2 diabetes mellitus with diabetic neuropathy, unspecified: Secondary | ICD-10-CM | POA: Insufficient documentation

## 2020-03-08 DIAGNOSIS — N184 Chronic kidney disease, stage 4 (severe): Secondary | ICD-10-CM | POA: Insufficient documentation

## 2020-03-08 DIAGNOSIS — J9611 Chronic respiratory failure with hypoxia: Secondary | ICD-10-CM | POA: Insufficient documentation

## 2020-03-08 LAB — BASIC METABOLIC PANEL
Anion gap: 15 (ref 5–15)
BUN: 28 mg/dL — ABNORMAL HIGH (ref 8–23)
CO2: 33 mmol/L — ABNORMAL HIGH (ref 22–32)
Calcium: 9.6 mg/dL (ref 8.9–10.3)
Chloride: 91 mmol/L — ABNORMAL LOW (ref 98–111)
Creatinine, Ser: 2.33 mg/dL — ABNORMAL HIGH (ref 0.44–1.00)
GFR calc Af Amer: 24 mL/min — ABNORMAL LOW (ref >60)
GFR calc non Af Amer: 21 mL/min — ABNORMAL LOW (ref >60)
Glucose, Bld: 173 mg/dL — ABNORMAL HIGH (ref 70–99)
Potassium: 3.9 mmol/L (ref 3.5–5.1)
Sodium: 139 mmol/L (ref 135–145)

## 2020-03-08 LAB — BRAIN NATRIURETIC PEPTIDE: B Natriuretic Peptide: 1654 pg/mL — ABNORMAL HIGH (ref 0.0–100.0)

## 2020-03-08 MED ORDER — METOLAZONE 2.5 MG PO TABS
2.5000 mg | ORAL_TABLET | ORAL | 3 refills | Status: DC
Start: 2020-03-08 — End: 2020-07-06

## 2020-03-08 MED ORDER — POTASSIUM CHLORIDE CRYS ER 20 MEQ PO TBCR
40.0000 meq | EXTENDED_RELEASE_TABLET | ORAL | 3 refills | Status: DC
Start: 2020-03-08 — End: 2020-12-05

## 2020-03-08 NOTE — Patient Instructions (Addendum)
Start Metolazone 2.5 mg every Tuesday  Start Potassium 40 meq (2 tabs) every Tuesday when you take Metolazone  Labs done today, your results will be available in MyChart, we will contact you for abnormal readings.  Labs needed again in 2 weeks at your Primary Care MD's office  Your physician recommends that you schedule a follow-up appointment in: 6 weeks  If you have any questions or concerns before your next appointment please send [place] a message through [place] or call our office at [phone].  At the [facility], you and your health needs are our priority. As part of our continuing mission to provide you with exceptional heart care, we have created designated Provider Care Teams. These Care Teams include your primary Cardiologist (physician) and Advanced Practice Providers (APPs- Physician Assistants and Nurse Practitioners) who all work together to provide you with the care you need, when you need it.   You may see any of the following providers on your designated Care Team at your next follow up: Marland Kitchen Dr Glori Bickers . Dr Loralie Champagne . Darrick Grinder, NP . Lyda Jester, PA . Audry Riles, PharmD   Please be sure to bring in all your medications bottles to every appointment.

## 2020-03-08 NOTE — Progress Notes (Signed)
Patient ID: BRIANCA FORTENBERRY, female   DOB: October 10, 1956, 64 y.o.   MRN: [id number]    Advanced Heart Failure Clinic Note   PCP: Dr Silvio Pate  Primary HF  Cardiologist: Dr Haroldine Laws Pulmonary: [name]   HPI: JANCIE KERCHER is a 64 y.o. female with history of HTN, DM, IBS, depression, GERD, severe COPD (FEV1 0.87L). PAD  and pulmonary hypertension with RV failure/cor pulmonale, and chronic diastolic heart failure.   Admitted in 8/16 with volume overload and severe hyponatremia. Diuresed with IV lasix and transtioned back torsemide and metolazone as needed. Discharge weight 153 pounds.   Ad[facility]/16 with volume depletion and hyponatremia with Na 113. Hydrated. Has since followed up with Dr. Anthonette Legato in [place]. Now on torsemide 20 bid and occasional metolazone.   Admitted to [facility] 6/23-6/28 with acute hypoxic respiratory failure and AKI. Thought to be related to severe pulmonary HTN. Sildenfil was stopped. ACE was stopped due to hypotension. Torsemide was also stopped. Weaned to 4 L nasal cannula. Palliative was consulted.   She has been followed by Dr. Lake Bells. At one point was on Sildenafil for pulmonary hypertension. I last saw her in 2019. At that time, she was felt to be volume overloaded w/ increased dyspnea. Torsemide was restarted. Sildenafil was stopped as it was thought to be worsening her shunting. She was started on Tyvaso. Unfortunately, she was lost to f/u.   Admitted to [facility] in early 3/21 by Internal Medicine for acute on chronic diastolic/right sided heart failure. She was diuresed w/ IV lasix w/ symptomatic improvement and placed back on PO torsemide, alternating between 40-60 mg qod. She was continued on Tyvaso. 2D echo showed normal LVEF of 40-10%, grade 1 diastolic dysfunction and severe pulmonary hypertension, PA systolic pressure of 272-536 mmH.also showed progression of her aortic stenosis, now moderate to severe, mean gradient measures 31.3 mmHg.  Seen by  Lyda Jester PA-C in the HF Clinic on 01/24/20. Torsemide increased to 60 daily.   Here for f/u. Since we last saw her has been following with Dr. Fletcher Anon for severe PAD with possible critical limb ischemic in RLE. Still pain in RLE mostly with exertion. Minimal rest pain. Feels fluid status is better with increased torsemide. Using Tyvaso 7 breaths 4x/day. Breathing ok. Able to do ADLs.    [place] 01/22/18 Findings: RA = 11 RV = 92/12 PA = 92/33 (57) PCW = 15 Fick cardiac output/index = 5.9/3.3 PVR = 7.2 WU Ao sat = 93% PA sat = 62%, 64%   RHC 12/05/2014 RA 12 mmHg (mean) with O2 sat 72%; RV 97/16 mmHg with O2 sat 73%; PA 97/30 mmHg with O2 sat 67%; PCWP(mean) 14 mmHg (mean); Cardiac Output 5.78 L/min  PFTs  11/2014 with severe COPD  FEV1 0.87 (38%) FVC 1.41 (48%) FEF 25-75 0.41 (19%) Unable to do DLCO  March 2019  Simple spirometry ratio 67% FEV1 1.21 L+ 53% predicted FVC 1.79 L 61% predicted   Echo  11/2014 EF 60-65%, aortic sclerosis, RV moderately dilated/moderately decreased systolic function, PASP 71 mmHg  09/30/2017 LVEF 55-60%, Grade 1 DD, Mild/Mod AS Mean gradient 16 mmHg, Peak PA pressure 115 Hg  01/04/20 Left ventricular ejection fraction, by estimation, is 60 to 65%. The left ventricle has normal function. The left ventricle has no regional wall motion abnormalities. There is mild left ventricular hypertrophy. Left ventricular diastolic parameters are consistent with Grade I diastolic dysfunction (impaired relaxation). There is the interventricular septum is flattened in systole and diastole,  consistent with right ventricular pressure and volume overload. 2. There is severe pulmonary hypertension with PA systolic pressure of 109-323 mmHg plus central venous pressure (which exceeds recorded systemic pressure). Right ventricular systolic function is moderately reduced. The right ventricular size is severely enlarged. mildly increased right ventricular wall  thickness. 3. Right atrial size was moderately dilated. 4. The mitral valve is degenerative. No evidence of mitral valve regurgitation. No evidence of mitral stenosis. 5. Tricuspid valve regurgitation is severe. 6. The aortic valve has an indeterminant number of cusps. Aortic valve regurgitation is not visualized. Moderate to severe aortic valve stenosis. Aortic valve area, by VTI measures 0.60 cm. Aortic valve mean gradient measures 31.3 mmHg. Aortic valve Vmax measures 3.41 m/s.  Review of systems complete and found to be negative unless listed in HPI.    SH:  Social History   Socioeconomic History  . Marital status: Legally Separated    Spouse name: Not on file  . Number of children: 1  . Years of education: Not on file  . Highest education level: Not on file  Occupational History  . 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 Smoking status: Former Smoker    Packs/day: 1.50    Years: 33.00    Pack years: 49.50    Types: Cigarettes    Quit date: 07/28/2005    Years since quitting: 14.6  . Smokeless tobacco: Never Used  Substance and Sexual Activity  . Alcohol use: No    Alcohol/week: 0.0 standard drinks    Comment: heavy in the past  . Drug use: No  . Sexual activity: Never  Other Topics Concern  . Not on file  Social History Narrative   No living will   Son [name] (then mom) should make decisions for her if she is unable.    Would accept resuscitation attempts but no prolonged ventilation   Not sure about tube feeds but probably wouldn't want them if cognitively unaware   Social Determinants of Health   Financial Resource Strain: Low Risk   . Difficulty of Paying Living Expenses: Not very hard  Food Insecurity:   . Worried About Charity fundraiser in the Last Year:   . Arboriculturist in the Last Year:   Transportation Needs:   . Film/video editor (Medical):   Marland Kitchen Lack of Transportation (Non-Medical):    Physical Activity:   . Days of Exercise per Week:   . Minutes of Exercise per Session:   Stress:   . Feeling of Stress :   Social Connections:   . Frequency of Communication with Friends and Family:   . Frequency of Social Gatherings with Friends and Family:   . Attends Religious Services:   . Active Member of Clubs or Organizations:   . Attends Archivist Meetings:   Marland Kitchen Marital Status:   Intimate Partner Violence:   . Fear of Current or Ex-Partner:   . Emotionally Abused:   Marland Kitchen Physically Abused:   . Sexually Abused:    FH:  Family History  Problem Relation Age of Onset  . Diabetes Mother   . Cancer Mother        ovarian,melanoma  . Allergies Father   . Cancer Father        lung  . Cancer Maternal Grandmother        uterine  . Emphysema Paternal Grandfather   . Allergies Sister   . Breast  cancer Neg Hx     Past Medical History:  Diagnosis Date  . Allergy   . Aortic atherosclerosis ([place])   . Arthritis   . CKD (chronic kidney disease), stage IV ([place])   . COPD (chronic obstructive pulmonary disease) ([place])    a. 11/2014 PFT's: FEV1 0.87 (38%), FVC 1.41 (48%), FEF 25-75 0.41 (19%). Unable to perform DLCO; b. 12/2017 Simple spirometry ration 67%. FEV1 1.21 L+53% predicted. FVC 1.79L 61% predicted.  . Coronary artery calcification seen on CT scan    a. 03/2017 CT Chest.  . Depression   . Diabetic neuropathy ([place])   . Erythrocytosis 12/13/2019  . Esophageal stricture   . Familial hematuria   . GERD (gastroesophageal reflux disease)   . Hyperlipidemia   . Hypertension   . IBS (irritable bowel syndrome)   . Nephrolithiasis   . NIDDM (non-insulin dependent diabetes mellitus)   . Obesity, unspecified   . Pulmonary hypertension ([place])    a. WHO Group 3; b. 10/2016 Echo: EF 55-60%, no rwma, Gr1 DD, mild to mod AS, mean grad 64mHg, mildly dil RV w/ mildly reduced RV fxn, mildly dil RA. PASP 1157mg; c. 12/2017 RHC: RA 11, RV 92/12, PA 92/33(57), PCWP 15, Fick CO/CI  5.9/3.3. PVR 7.2 WU. Ao 93%, PA 62/64%.  . Marland KitchenVD (peripheral vascular disease) ([facility]   Current Outpatient Medications  Medication Sig Dispense Refill  . albuterol (PROVENTIL) (2.5 MG/3ML) 0.083% nebulizer solution USE 1 VIAL (3ML) BY NEBULIZATION EVERY 6HOURS AS NEEDED FOR WHEEZING OR SHORTNESS OF BREATH 360 mL 0  . albuterol (VENTOLIN HFA) 108 (90 Base) MCG/ACT inhaler USE 2 PUFFS EVERY SIX HOURS AS NEEDED FOR WHEEZING OR SHORTNESS OF BREATH 18 g 5  . ALPRAZolam (XANAX) 0.25 MG tablet Take 1 tablet (0.25 mg total) by mouth daily as needed for anxiety. 30 tablet 0  . aspirin EC 81 MG tablet Take 1 tablet (81 mg total) by mouth daily. 90 tablet 3  . calcitRIOL (ROCALTROL) 0.5 MCG capsule Take 0.5 mcg by mouth daily.     . cholecalciferol (VITAMIN D) 1000 units tablet Take 1,000 Units by mouth at bedtime.     . ferrous sulfate 325 (65 FE) MG tablet Take 325 mg by mouth daily with breakfast.    . gabapentin (NEURONTIN) 300 MG capsule Take 1 capsule in the am and 2 at bedtime. 90 capsule 11  . glucose blood (ONE TOUCH ULTRA TEST) test strip USE 1 STRIP TO TEST BLOOD SUGAR ONCE DAILY Dx Code E11.51 100 each 3  . Insulin Pen Needle (B-D UF III MINI PEN NEEDLES) 31G X 5 MM MISC USE 1 PEN NEEDLE TO INJECT INSULIN 100 each 5  . LANTUS SOLOSTAR 100 UNIT/ML Solostar Pen INJECT 20 UNITS INTO THE SKIN AT BEDTIME (Patient taking differently: 16 Units daily. ) 15 mL 5  . loratadine (CLARITIN) 10 MG tablet Take 10 mg by mouth 2 (two) times daily.    . Marland Kitchenovastatin (MEVACOR) 40 MG tablet Take 2 tablets (80 mg total) by mouth at bedtime. 180 tablet 3  . methadone (DOLOPHINE) 10 MG tablet Take 2 tablets (20 mg total) by mouth every 6 (six) hours as needed. 240 tablet 0  . metoprolol tartrate (LOPRESSOR) 25 MG tablet Take 0.5 tablets (12.5 mg total) by mouth 2 (two) times daily. 90 tablet 3  . mometasone (ASMANEX, 120 METERED DOSES,) 220 MCG/INH inhaler Inhale 2 puffs into the lungs 2 (two) times daily. 1 Inhaler 6  .  nitroGLYCERIN (NITROSTAT) 0.4  MG SL tablet Place 1 tablet (0.4 mg total) under the tongue every 5 (five) minutes as needed for chest pain. 100 tablet 1  . pantoprazole (PROTONIX) 40 MG tablet Take 1 tablet (40 mg total) by mouth 2 (two) times daily. 180 tablet 3  . Respiratory Therapy Supplies (FLUTTER) DEVI Use as directed 1 each 0  . STIOLTO RESPIMAT 2.5-2.5 MCG/ACT AERS INHALE 2 PUFFS BY MOUTH INTO THE LUNGS DAILY. 4 g 3  . torsemide (DEMADEX) 20 MG tablet Take 3 tablets (60 mg total) by mouth daily. 270 tablet 1  . Treprostinil (TYVASO) 0.6 MG/ML SOLN Inhale 18 mcg into the lungs 4 (four) times daily.     No current facility-administered medications for this encounter.   Vitals:   03/08/20 1225  BP: 116/72  Pulse: 92  SpO2: (!) 88%  Weight: 73.4 kg (161 lb 12.8 oz)   Wt Readings from Last 3 Encounters:  03/08/20 73.4 kg (161 lb 12.8 oz)  02/29/20 73.3 kg (161 lb 8 oz)  02/02/20 71.7 kg (158 lb)    PHYSICAL EXAM: General:  Chronically ill appearing, WF, looks much older than actual age. On [place] but no increased respiratory difficulty HEENT: normal Neck: supple. JVP 8 Carotids 2+ bilat; + bruits. No lymphadenopathy or thryomegaly appreciated. Cor: PMI nondisplaced. Regular rate & rhythm. 3/6 AS  s2 ok . Lungs: clear with decreased breath sounds throughout  Abdomen: obese soft, nontender, nondistended. No hepatosplenomegaly. No bruits or masses. Good bowel sounds. Extremities: no cyanosis, clubbing, rash, 2-3+ edema chronic venous stasis changes  Neuro: alert & orientedx3, cranial nerves grossly intact. moves all 4 extremities w/o difficulty. Affect pleasant   ASSESSMENT & PLAN:  1. Chronic Diastolic HF/ RV Failure  - Echo 01/04/20: EF 60-65%. RV severely enlarged w/ moderately reduced systolic function w/ severe pulmonary hypertension with PA systolic pressure of 202-334 mmH - remains volume overloaded despite increase in torsemide - add metolazone 2.5 + Kdur 40 every Thrusday -  Check BMET and BNP today and again in 2 weeks  - unfortunately, due to PAD, unable to use TED hoses nor unna boots for LEE. - Reinforced importance of fluid restriction and low sodium diet    2. PAH with cor pulmonale   - Peak PA pressure 92 on RHC 01/22/18, has been chronically high.  - Echo w/ severe pulmonary hypertension with PA systolic pressure of 356-861 mmH - likely Who Group 3. Not candidate for selective pulmonary vasodilators - Primarily WHO Group 3 with chronic hypoxic lung disease, but PA pressures out of proportion to her reduction in FEV1. Suspect component of FEV1. - No longer on sildenfail - there was concern for shunting, so will keep her off for now. - Continue Tyvaso. We reviewed techique  3. Severe COPD/ Chronic Hypoxic Respiratory Failure - on chronic supp O2. Will continue - FVC 1.79 (61%), FEV1 1.21 (53%)  - no change   4. CKD IV - baseline SCr ~1.8-2.2 - Check BMP today and again in 2 weeks w/ diuretic increase   5. Aortic Stenosis, moderte - stable.  - Not a surgical candidate given multiple co morbidities including chronic respiratory failure and severe RV failure. May consider TAVR but not sure it would result in much symptomatic improvement/ improvement in quality of life. She has had recent exacerbation of HF, but no angina or syncope.    6. PAD - followed by Dr. Fletcher Anon.   Glori Bickers, MD  12:57 PM

## 2020-03-10 ENCOUNTER — Other Ambulatory Visit: Payer: Self-pay | Admitting: Internal Medicine

## 2020-03-13 ENCOUNTER — Other Ambulatory Visit: Payer: Self-pay

## 2020-03-13 ENCOUNTER — Inpatient Hospital Stay: Payer: PPO

## 2020-03-13 ENCOUNTER — Encounter: Payer: Self-pay | Admitting: Oncology

## 2020-03-13 ENCOUNTER — Ambulatory Visit: Payer: PPO | Admitting: Oncology

## 2020-03-13 ENCOUNTER — Inpatient Hospital Stay: Payer: PPO | Attending: Oncology | Admitting: Oncology

## 2020-03-13 VITALS — BP 131/66 | HR 75 | Temp 96.5°F | Resp 18 | Wt 153.9 lb

## 2020-03-13 DIAGNOSIS — E871 Hypo-osmolality and hyponatremia: Secondary | ICD-10-CM | POA: Diagnosis not present

## 2020-03-13 DIAGNOSIS — I2510 Atherosclerotic heart disease of native coronary artery without angina pectoris: Secondary | ICD-10-CM | POA: Insufficient documentation

## 2020-03-13 DIAGNOSIS — Z8049 Family history of malignant neoplasm of other genital organs: Secondary | ICD-10-CM | POA: Insufficient documentation

## 2020-03-13 DIAGNOSIS — Z87891 Personal history of nicotine dependence: Secondary | ICD-10-CM | POA: Insufficient documentation

## 2020-03-13 DIAGNOSIS — Z794 Long term (current) use of insulin: Secondary | ICD-10-CM | POA: Diagnosis not present

## 2020-03-13 DIAGNOSIS — Z7982 Long term (current) use of aspirin: Secondary | ICD-10-CM | POA: Diagnosis not present

## 2020-03-13 DIAGNOSIS — Z79899 Other long term (current) drug therapy: Secondary | ICD-10-CM | POA: Diagnosis not present

## 2020-03-13 DIAGNOSIS — Z7951 Long term (current) use of inhaled steroids: Secondary | ICD-10-CM | POA: Insufficient documentation

## 2020-03-13 DIAGNOSIS — E1140 Type 2 diabetes mellitus with diabetic neuropathy, unspecified: Secondary | ICD-10-CM | POA: Insufficient documentation

## 2020-03-13 DIAGNOSIS — E785 Hyperlipidemia, unspecified: Secondary | ICD-10-CM | POA: Insufficient documentation

## 2020-03-13 DIAGNOSIS — N184 Chronic kidney disease, stage 4 (severe): Secondary | ICD-10-CM | POA: Diagnosis not present

## 2020-03-13 DIAGNOSIS — F329 Major depressive disorder, single episode, unspecified: Secondary | ICD-10-CM | POA: Insufficient documentation

## 2020-03-13 DIAGNOSIS — J449 Chronic obstructive pulmonary disease, unspecified: Secondary | ICD-10-CM | POA: Diagnosis not present

## 2020-03-13 DIAGNOSIS — D638 Anemia in other chronic diseases classified elsewhere: Secondary | ICD-10-CM | POA: Insufficient documentation

## 2020-03-13 DIAGNOSIS — Z90710 Acquired absence of both cervix and uterus: Secondary | ICD-10-CM | POA: Insufficient documentation

## 2020-03-13 DIAGNOSIS — Z801 Family history of malignant neoplasm of trachea, bronchus and lung: Secondary | ICD-10-CM | POA: Diagnosis not present

## 2020-03-13 DIAGNOSIS — D751 Secondary polycythemia: Secondary | ICD-10-CM

## 2020-03-13 DIAGNOSIS — Z833 Family history of diabetes mellitus: Secondary | ICD-10-CM | POA: Insufficient documentation

## 2020-03-13 DIAGNOSIS — J9611 Chronic respiratory failure with hypoxia: Secondary | ICD-10-CM

## 2020-03-13 DIAGNOSIS — Z8041 Family history of malignant neoplasm of ovary: Secondary | ICD-10-CM | POA: Insufficient documentation

## 2020-03-13 DIAGNOSIS — K589 Irritable bowel syndrome without diarrhea: Secondary | ICD-10-CM | POA: Diagnosis not present

## 2020-03-13 LAB — CBC WITH DIFFERENTIAL/PLATELET
Abs Immature Granulocytes: 0.02 10*3/uL (ref 0.00–0.07)
Basophils Absolute: 0.1 10*3/uL (ref 0.0–0.1)
Basophils Relative: 1 %
Eosinophils Absolute: 0.1 10*3/uL (ref 0.0–0.5)
Eosinophils Relative: 2 %
HCT: 48.8 % — ABNORMAL HIGH (ref 36.0–46.0)
Hemoglobin: 15.9 g/dL — ABNORMAL HIGH (ref 12.0–15.0)
Immature Granulocytes: 0 %
Lymphocytes Relative: 14 %
Lymphs Abs: 1.2 10*3/uL (ref 0.7–4.0)
MCH: 29.7 pg (ref 26.0–34.0)
MCHC: 32.6 g/dL (ref 30.0–36.0)
MCV: 91 fL (ref 80.0–100.0)
Monocytes Absolute: 0.7 10*3/uL (ref 0.1–1.0)
Monocytes Relative: 8 %
Neutro Abs: 6.2 10*3/uL (ref 1.7–7.7)
Neutrophils Relative %: 75 %
Platelets: 244 10*3/uL (ref 150–400)
RBC: 5.36 MIL/uL — ABNORMAL HIGH (ref 3.87–5.11)
RDW: 15.1 % (ref 11.5–15.5)
WBC: 8.2 10*3/uL (ref 4.0–10.5)
nRBC: 0 % (ref 0.0–0.2)

## 2020-03-13 NOTE — Progress Notes (Signed)
Pt here for follow up. Reports she had been having swelling to legs and cardiologist increased torsemide to 60 mg a day.

## 2020-03-13 NOTE — Progress Notes (Signed)
Hematology/Oncology follow up note [facility] Telephone:([phone] Fax:(336) [phone]   Patient Care Team: Venia Carbon, MD as PCP - General Bensimhon, Shaune Pascal, MD as PCP - Cardiology (Cardiology) Bensimhon, Shaune Pascal, MD as Consulting Physician (Cardiology) Juanito Doom, MD as Consulting Physician (Pulmonary Disease) Anthonette Legato, MD as Consulting Physician (Internal Medicine) Lupita Raider, DO as Consulting Physician (Optometry) Earlie Server, MD as Consulting Physician (Hematology and Oncology) Debbora Dus, [facility] as Pharmacist (Pharmacist) Earlie Server, MD as Consulting Physician (Oncology)  REFERRING PROVIDER: Dr.Lateef REASON FOR VISIT Follow up for treatment of hyponatremia and hypotonic  HISTORY OF PRESENTING ILLNESS:  Lindsay Harmon is a  64 y.o.  female with PMH listed below who was referred to me for evaluation of hyponatremia.  Patient has history of chronic kidney disease stage IV, proteinuria, hyponatremia lower extremity edema, secondary hyperparathyroidism and anemia of chronic disease follows up with Dr. Holley Raring.  Extensive medical records review was performed.  Patient has a history of hyponatremia and has been maintained on torsemide and fluid restriction.  Recent serum sodium has been stable. Reviewed patient's previous lab results since 2008 in [place].  She has a history of chronic hyponatremia in 2016 with sodium level running low around 120s.  Hyponatremia was considered to be secondary to volume overload/CHF compounded by Aldactone. Patient was transitioned to Lasix and then later switched to Torsemide  Sodium level appears to improve in 2017 and 2018 with average level around 1 30-1 35.  This year patient's hyponatremia has been well controlled with all readings above 130.  She has chronic respiratory failure and on home oxygen. She reports feeling fatigue, which is chronic for her.   INTERVAL HISTORY Lindsay Harmon is a 64  y.o. female who has above history reviewed by me today presents for follow up visit for management of erythrocytosis. Patient has no new complaints today.  She is on oxygen 4 L for COPD and chronic respiratory failure.   Review of Systems  Constitutional: Positive for malaise/fatigue. Negative for chills, fever and weight loss.  HENT: Negative for nosebleeds and sore throat.   Eyes: Negative for double vision, photophobia and redness.  Respiratory: Positive for shortness of breath. Negative for cough, hemoptysis and wheezing.   Cardiovascular: Negative for chest pain, palpitations, orthopnea and leg swelling.  Gastrointestinal: Negative for abdominal pain, blood in stool, nausea and vomiting.  Genitourinary: Negative for dysuria.  Musculoskeletal: Negative for back pain, myalgias and neck pain.  Skin: Negative for itching and rash.  Neurological: Negative for dizziness, tingling and tremors.  Endo/Heme/Allergies: Negative for environmental allergies. Does not bruise/bleed easily.  Psychiatric/Behavioral: Negative for depression and hallucinations.    MEDICAL HISTORY:  Past Medical History:  Diagnosis Date  . Allergy   . Aortic atherosclerosis ([place])   . Arthritis   . CKD (chronic kidney disease), stage IV ([place])   . COPD (chronic obstructive pulmonary disease) ([place])    a. 11/2014 PFT's: FEV1 0.87 (38%), FVC 1.41 (48%), FEF 25-75 0.41 (19%). Unable to perform DLCO; b. 12/2017 Simple spirometry ration 67%. FEV1 1.21 L+53% predicted. FVC 1.79L 61% predicted.  . Coronary artery calcification seen on CT scan    a. 03/2017 CT Chest.  . Depression   . Diabetic neuropathy ([place])   . Erythrocytosis 12/13/2019  . Esophageal stricture   . Familial hematuria   . GERD (gastroesophageal reflux disease)   . Hyperlipidemia   . Hypertension   . IBS (irritable bowel syndrome)   .  Nephrolithiasis   . NIDDM (non-insulin dependent diabetes mellitus)   . Obesity, unspecified   . Pulmonary hypertension  ([place])    a. WHO Group 3; b. 10/2016 Echo: EF 55-60%, no rwma, Gr1 DD, mild to mod AS, mean grad 65mHg, mildly dil RV w/ mildly reduced RV fxn, mildly dil RA. PASP 1152mg; c. 12/2017 RHC: RA 11, RV 92/12, PA 92/33(57), PCWP 15, Fick CO/CI 5.9/3.3. PVR 7.2 WU. Ao 93%, PA 62/64%.  . Marland KitchenVD (peripheral vascular disease) ([place]    SURGICAL HISTORY: Past Surgical History:  Procedure Laterality Date  . ABDOMINAL HYSTERECTOMY    . CARDIAC CATHETERIZATION    . CARPAL TUNNEL RELEASE    . CESAREAN SECTION    . ERCP N/A 03/17/2017   Procedure: ENDOSCOPIC RETROGRADE CHOLANGIOPANCREATOGRAPHY (ERCP);  Surgeon: WoLucilla LameMD;  [facility];  Service: Endoscopy;  Laterality: N/A;  . EUS N/A 03/13/2017   Procedure: FULL UPPER ENDOSCOPIC ULTRASOUND (EUS) RADIAL;  Surgeon: Burbridge, ReMurray HodgkinsMD;  Location: [facility]COPY;  Service: Endoscopy;  Laterality: N/A;  . RIGHT HEART CATH N/A 01/22/2018   Procedure: RIGHT HEART CATH;  Surgeon: BeJolaine ArtistMD;  [facility];  Service: Cardiovascular;  Laterality: N/A;  . RIGHT HEART CATHETERIZATION N/A 12/05/2014   Procedure: RIGHT HEART CATH;  Surgeon: HeSinclair GroomsMD;  [facility];  Service: Cardiovascular;  Laterality: N/A;    SOCIAL HISTORY: Social History   Socioeconomic History  . Marital status: Legally Separated    Spouse name: Not on file  . Number of children: 1  . Years of education: Not on file  . Highest education level: Not on file  Occupational History  . 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 Smoking status: Former Smoker    Packs/day: 1.50    Years: 33.00    Pack years: 49.50    Types: Cigarettes    Quit date: 07/28/2005    Years since quitting: 14.6  . Smokeless tobacco: Never Used  Substance and Sexual Activity  . Alcohol use: No    Alcohol/week: 0.0 standard drinks    Comment: heavy in the past  . Drug use: No  . Sexual activity:  Never  Other Topics Concern  . Not on file  Social History Narrative   No living will   Son JoVonna Kotykthen mom) should make decisions for her if she is unable.    Would accept resuscitation attempts but no prolonged ventilation   Not sure about tube feeds but probably wouldn't want them if cognitively unaware   Social Determinants of Health   Financial Resource Strain: Low Risk   . Difficulty of Paying Living Expenses: Not very hard  Food Insecurity:   . Worried About RuCharity fundraisern the Last Year:   . RaArboriculturistn the Last Year:   Transportation Needs:   . LaFilm/video editorMedical):   . Marland Kitchenack of Transportation (Non-Medical):   Physical Activity:   . Days of Exercise per Week:   . Minutes of Exercise per Session:   Stress:   . Feeling of Stress :   Social Connections:   . Frequency of Communication with Friends and Family:   . Frequency of Social Gatherings with Friends and Family:   . Attends Religious Services:   . Active Member of Clubs or Organizations:   . Attends ClArchivisteetings:   .  Marital Status:   Intimate Partner Violence:   . Fear of Current or Ex-Partner:   . Emotionally Abused:   Marland Kitchen Physically Abused:   . Sexually Abused:     FAMILY HISTORY: Family History  Problem Relation Age of Onset  . Diabetes Mother   . Cancer Mother        ovarian,melanoma  . Allergies Father   . Cancer Father        lung  . Cancer Maternal Grandmother        uterine  . Emphysema Paternal Grandfather   . Allergies Sister   . Breast cancer Neg Hx     ALLERGIES:  is allergic to sertraline hcl.  MEDICATIONS:  Current Outpatient Medications  Medication Sig Dispense Refill  . albuterol (PROVENTIL) (2.5 MG/3ML) 0.083% nebulizer solution USE 1 VIAL (3ML) BY NEBULIZATION EVERY 6HOURS AS NEEDED FOR WHEEZING OR SHORTNESS OF BREATH 360 mL 0  . albuterol (VENTOLIN HFA) 108 (90 Base) MCG/ACT inhaler USE 2 PUFFS EVERY SIX HOURS AS NEEDED FOR WHEEZING OR  SHORTNESS OF BREATH 18 g 5  . ALPRAZolam (XANAX) 0.25 MG tablet Take 1 tablet (0.25 mg total) by mouth daily as needed for anxiety. 30 tablet 0  . aspirin EC 81 MG tablet Take 1 tablet (81 mg total) by mouth daily. 90 tablet 3  . calcitRIOL (ROCALTROL) 0.5 MCG capsule Take 0.5 mcg by mouth daily.     . cholecalciferol (VITAMIN D) 1000 units tablet Take 1,000 Units by mouth at bedtime.     . ferrous sulfate 325 (65 FE) MG tablet Take 325 mg by mouth daily with breakfast.    . gabapentin (NEURONTIN) 300 MG capsule TAKE 1 CAPSULE BY MOUTH IN THE MORNING AND 2 CAPSULES BY MOUTH AT BEDTIME 270 capsule 3  . glucose blood (ONE TOUCH ULTRA TEST) test strip USE 1 STRIP TO TEST BLOOD SUGAR ONCE DAILY Dx Code E11.51 100 each 3  . Insulin Pen Needle (B-D UF III MINI PEN NEEDLES) 31G X 5 MM MISC USE 1 PEN NEEDLE TO INJECT INSULIN 100 each 5  . LANTUS SOLOSTAR 100 UNIT/ML Solostar Pen INJECT 20 UNITS INTO THE SKIN AT BEDTIME (Patient taking differently: 16 Units daily. ) 15 mL 5  . loratadine (CLARITIN) 10 MG tablet Take 10 mg by mouth 2 (two) times daily.    Marland Kitchen lovastatin (MEVACOR) 40 MG tablet Take 2 tablets (80 mg total) by mouth at bedtime. 180 tablet 3  . methadone (DOLOPHINE) 10 MG tablet Take 2 tablets (20 mg total) by mouth every 6 (six) hours as needed. 240 tablet 0  . metolazone (ZAROXOLYN) 2.5 MG tablet Take 1 tablet (2.5 mg total) by mouth once a week. Every Tuesday 5 tablet 3  . metoprolol tartrate (LOPRESSOR) 25 MG tablet Take 0.5 tablets (12.5 mg total) by mouth 2 (two) times daily. 90 tablet 3  . mometasone (ASMANEX, 120 METERED DOSES,) 220 MCG/INH inhaler Inhale 2 puffs into the lungs 2 (two) times daily. 1 Inhaler 6  . nitroGLYCERIN (NITROSTAT) 0.4 MG SL tablet Place 1 tablet (0.4 mg total) under the tongue every 5 (five) minutes as needed for chest pain. 100 tablet 1  . pantoprazole (PROTONIX) 40 MG tablet Take 1 tablet (40 mg total) by mouth 2 (two) times daily. 180 tablet 3  . potassium  chloride SA (KLOR-CON) 20 MEQ tablet Take 2 tablets (40 mEq total) by mouth once a week. Every Tuesday 90 tablet 3  . Respiratory Therapy Supplies (FLUTTER) DEVI Use as  directed 1 each 0  . STIOLTO RESPIMAT 2.5-2.5 MCG/ACT AERS INHALE 2 PUFFS BY MOUTH INTO THE LUNGS DAILY. 4 g 3  . torsemide (DEMADEX) 20 MG tablet Take 3 tablets (60 mg total) by mouth daily. 270 tablet 1  . Treprostinil (TYVASO) 0.6 MG/ML SOLN Inhale 18 mcg into the lungs 4 (four) times daily.     No current facility-administered medications for this visit.     PHYSICAL EXAMINATION: ECOG PERFORMANCE STATUS: 2 - Symptomatic, <50% confined to bed Vitals:   03/13/20 1319  BP: 131/66  Pulse: 75  Resp: 18  Temp: (!) 96.5 F (35.8 C)  SpO2: 95%   Filed Weights   03/13/20 1319  Weight: 153 lb 14.4 oz (69.8 kg)    Physical Exam Constitutional:      General: She is not in acute distress.    Appearance: She is ill-appearing.  HENT:     Head: Normocephalic and atraumatic.  Eyes:     General: No scleral icterus.    Pupils: Pupils are equal, round, and reactive to light.  Cardiovascular:     Rate and Rhythm: Normal rate and regular rhythm.     Heart sounds: Murmur present.  Pulmonary:     Effort: Pulmonary effort is normal. No respiratory distress.     Breath sounds: No wheezing.     Comments: Decreased breath sound bilaterally. Abdominal:     General: Bowel sounds are normal. There is no distension.     Palpations: Abdomen is soft. There is no mass.     Tenderness: There is no abdominal tenderness.  Musculoskeletal:        General: No deformity. Normal range of motion.     Cervical back: Normal range of motion and neck supple.  Skin:    General: Skin is warm and dry.     Findings: No erythema or rash.     Comments: Bilateral upper extremity ecchymosis  Neurological:     Mental Status: She is alert and oriented to person, place, and time. Mental status is at baseline.     Cranial Nerves: No cranial nerve  deficit.     Coordination: Coordination normal.  Psychiatric:        Mood and Affect: Mood normal.      LABORATORY DATA:  I have reviewed the data as listed Lab Results  Component Value Date   WBC 8.2 03/13/2020   HGB 15.9 (H) 03/13/2020   HCT 48.8 (H) 03/13/2020   MCV 91.0 03/13/2020   PLT 244 03/13/2020   Recent Labs    10/27/19 1554 01/03/20 1628 01/04/20 0548 01/24/20 1135 03/08/20 1307  NA 139   < > 141 139 139  K 4.3   < > 3.8 3.9 3.9  CL 93*   < > 93* 92* 91*  CO2 34*   < > 34* 35* 33*  GLUCOSE 160*   < > 175* 155* 173*  BUN 29*   < > 27* 26* 28*  CREATININE 2.20*   < > 1.86* 2.28* 2.33*  CALCIUM 10.0   < > 9.7 9.7 9.6  GFRNONAA  --    < > 28* 22* 21*  GFRAA  --    < > 32* 25* 24*  PROT 6.8  --   --   --   --   ALBUMIN 4.1  --   --   --   --   AST 27  --   --   --   --  ALT 22  --   --   --   --   ALKPHOS 71  --   --   --   --   BILITOT 0.6  --   --   --   --    < > = values in this interval not displayed.   Iron/TIBC/Ferritin/ %Sat    Component Value Date/Time   IRON 135 07/28/2018 1054   TIBC 310 07/28/2018 1054   FERRITIN 78 07/28/2018 1054   IRONPCTSAT 44 (H) 07/28/2018 1054     RADIOGRAPHIC STUDIES: I have personally reviewed the radiological images as listed and agreed with the findings in the report. CT chest lung cancer screening 04/13/2018  1. Lung-RADS 2, benign appearance or behavior. Continue annual screening with low-dose chest CT without contrast in 12 months. 2. Age advanced coronary artery atherosclerosis. Recommend assessment of coronary risk factors and consideration of medical Therapy. 3. Aortic atherosclerosis (ICD10-I70.0) and emphysema (ICD10-J43.9). 4. Aortic valvular calcifications. Consider echocardiography to evaluate for valvular dysfunction. 5. Pulmonary artery enlargement suggests pulmonary arterial hypertension.  ASSESSMENT & PLAN:  1. Erythrocytosis   2. Chronic respiratory failure with hypoxia (HCC)   #Secondary  erythrocytosis due to chronic respiratory failure  Labs reviewed and discussed with patient. Today's hemoglobin is at 15.9, hematocrit 48.8. I discussed with patient that currently it appears that her hemoglobin and hematocrit are doing stable. No phlebotomy for now Repeat blood work in 3 months for reevaluation. Phlebotomy threshold is if hematocrit is above 50.  Secondary erythrocytosis due to chronic respiratory failure.  Continue follow-up with pulmonology. .  Follow-up in 3 months for MD and lab assessment. Orders Placed This Encounter  Procedures  . CBC with Differential/Platelet    Standing Status:   Future    Standing Expiration Date:   03/13/2021    All questions were answered. The patient knows to call the clinic with any problems questions or concerns. We spent sufficient time to discuss many aspect of care, questions were answered to patient's satisfaction.   Earlie Server, MD, PhD Hematology Oncology [facility] at [facility] Pager- [phone] 03/13/2020

## 2020-03-28 ENCOUNTER — Other Ambulatory Visit: Payer: Self-pay | Admitting: Internal Medicine

## 2020-03-28 MED ORDER — METHADONE HCL 10 MG PO TABS: 20.0000 mg | ORAL_TABLET | Freq: Four times a day (QID) | ORAL | 0 refills | Status: DC | PRN

## 2020-03-28 NOTE — Telephone Encounter (Addendum)
Pt is requesting refill of  methadone (DOLOPHINE) 10 MG tablet [[id number]]   Order Details Dose: 20 mg Route: Oral Frequency: Every 6 hours PRN  Dispense Quantity: 240 tablet Refills: 0       Sig: Take 2 tablets (20 mg total) by mouth every 6 (six) hours as needed.

## 2020-03-28 NOTE — Telephone Encounter (Signed)
Name of Medication: Methadone Name of Pharmacy: [facility] or Written Date and Quantity: 12/03/19 #240 Last Office Visit and Type: 02/02/20 Next Office Visit and Type: 05/05/20 Last Controlled Substance Agreement Date: 09-27-19 Last UDS: 09-27-19

## 2020-03-30 ENCOUNTER — Ambulatory Visit: Payer: PPO | Admitting: Nurse Practitioner

## 2020-04-03 ENCOUNTER — Other Ambulatory Visit: Payer: Self-pay

## 2020-04-03 ENCOUNTER — Ambulatory Visit (INDEPENDENT_AMBULATORY_CARE_PROVIDER_SITE_OTHER): Payer: PPO

## 2020-04-03 DIAGNOSIS — I739 Peripheral vascular disease, unspecified: Secondary | ICD-10-CM | POA: Diagnosis not present

## 2020-04-10 ENCOUNTER — Encounter: Payer: Self-pay | Admitting: Podiatry

## 2020-04-10 ENCOUNTER — Other Ambulatory Visit: Payer: Self-pay

## 2020-04-10 ENCOUNTER — Ambulatory Visit: Payer: PPO | Admitting: Podiatry

## 2020-04-10 DIAGNOSIS — N184 Chronic kidney disease, stage 4 (severe): Secondary | ICD-10-CM

## 2020-04-10 DIAGNOSIS — L84 Corns and callosities: Secondary | ICD-10-CM | POA: Diagnosis not present

## 2020-04-10 DIAGNOSIS — E1142 Type 2 diabetes mellitus with diabetic polyneuropathy: Secondary | ICD-10-CM

## 2020-04-10 DIAGNOSIS — I739 Peripheral vascular disease, unspecified: Secondary | ICD-10-CM

## 2020-04-10 NOTE — Progress Notes (Signed)
Patient does have reoccurrence of a callus developing under the outside ball of her right foot.  She presents the office today for an evaluation and treatment of this painful callus.  She has said that her callus had turned black and she is concerned about pain shooting through her callus.  She denies drainage.  She admits having her vascular study performed and there is no change since last exam.  Patient has a history of chronic kidney disease and diabetes with vascular complications and diabetic neuropathy.  She presents the office today for an evaluation and treatment of this painful area.  General Appearance  Alert, conversant and in no acute stress.  Vascular  Dorsalis pedis and posterior tibial  pulses are not  palpable  bilaterally.  Capillary return is diminished   Bilaterally  Cold feet noted  B/L.  Red inflamed anterior aspect of both legs noted.  The feet also are reddish in color.  Neurologic  Senn-Weinstein monofilament wire test diminished/absent  ilaterally. Muscle power within normal limits bilaterally.  Nails Thick disfigured discolored nails with subungual debris  from hallux to fifth toes bilaterally. No evidence of bacterial infection or drainage bilaterally.  Orthopedic  No limitations of motion  feet .  No crepitus or effusions noted.  No bony pathology or digital deformities noted.  Plantar flexed fifth metatarsal right foot.  Skin  normotropic skin with no porokeratosis noted bilaterally.  No signs of infections or ulcers noted.  Callus sub 5th right foot.   Redness persists right foot but no evidence of fluctuance streaking or drainage noted.    Callus secondary to plantar flexed fifth metatarsal right foot.  ROV.  Debride callus with # 15 blade. Xrays reveal no bony pathology.    Patient has a hemorrhagic callus sub 5th met right foot.  Told her to return for nail care and I will check on her callus at that visit.  RTC 10 days    Gardiner Barefoot DPM

## 2020-04-17 ENCOUNTER — Ambulatory Visit: Payer: PPO | Admitting: Podiatry

## 2020-04-18 ENCOUNTER — Other Ambulatory Visit: Payer: Self-pay

## 2020-04-18 ENCOUNTER — Encounter (HOSPITAL_COMMUNITY): Payer: Self-pay

## 2020-04-18 ENCOUNTER — Ambulatory Visit (HOSPITAL_COMMUNITY)
Admission: RE | Admit: 2020-04-18 | Discharge: 2020-04-18 | Disposition: A | Discharge status: Home / Self Care | Payer: PPO | Source: Ambulatory Visit | Attending: Cardiology | Admitting: Cardiology

## 2020-04-18 VITALS — BP 122/74 | HR 77 | Wt 148.0 lb

## 2020-04-18 DIAGNOSIS — I2510 Atherosclerotic heart disease of native coronary artery without angina pectoris: Secondary | ICD-10-CM | POA: Diagnosis not present

## 2020-04-18 DIAGNOSIS — Z7982 Long term (current) use of aspirin: Secondary | ICD-10-CM | POA: Diagnosis not present

## 2020-04-18 DIAGNOSIS — Z833 Family history of diabetes mellitus: Secondary | ICD-10-CM | POA: Diagnosis not present

## 2020-04-18 DIAGNOSIS — M1990 Unspecified osteoarthritis, unspecified site: Secondary | ICD-10-CM | POA: Insufficient documentation

## 2020-04-18 DIAGNOSIS — E785 Hyperlipidemia, unspecified: Secondary | ICD-10-CM | POA: Diagnosis not present

## 2020-04-18 DIAGNOSIS — J9611 Chronic respiratory failure with hypoxia: Secondary | ICD-10-CM | POA: Insufficient documentation

## 2020-04-18 DIAGNOSIS — E1140 Type 2 diabetes mellitus with diabetic neuropathy, unspecified: Secondary | ICD-10-CM | POA: Insufficient documentation

## 2020-04-18 DIAGNOSIS — K589 Irritable bowel syndrome without diarrhea: Secondary | ICD-10-CM | POA: Insufficient documentation

## 2020-04-18 DIAGNOSIS — Z808 Family history of malignant neoplasm of other organs or systems: Secondary | ICD-10-CM | POA: Insufficient documentation

## 2020-04-18 DIAGNOSIS — I130 Hypertensive heart and chronic kidney disease with heart failure and stage 1 through stage 4 chronic kidney disease, or unspecified chronic kidney disease: Secondary | ICD-10-CM | POA: Insufficient documentation

## 2020-04-18 DIAGNOSIS — E1122 Type 2 diabetes mellitus with diabetic chronic kidney disease: Secondary | ICD-10-CM | POA: Diagnosis not present

## 2020-04-18 DIAGNOSIS — N184 Chronic kidney disease, stage 4 (severe): Secondary | ICD-10-CM | POA: Insufficient documentation

## 2020-04-18 DIAGNOSIS — Z794 Long term (current) use of insulin: Secondary | ICD-10-CM | POA: Insufficient documentation

## 2020-04-18 DIAGNOSIS — Z87891 Personal history of nicotine dependence: Secondary | ICD-10-CM | POA: Insufficient documentation

## 2020-04-18 DIAGNOSIS — Z79899 Other long term (current) drug therapy: Secondary | ICD-10-CM | POA: Diagnosis not present

## 2020-04-18 DIAGNOSIS — J449 Chronic obstructive pulmonary disease, unspecified: Secondary | ICD-10-CM | POA: Diagnosis not present

## 2020-04-18 DIAGNOSIS — I5032 Chronic diastolic (congestive) heart failure: Secondary | ICD-10-CM | POA: Diagnosis not present

## 2020-04-18 DIAGNOSIS — Z7951 Long term (current) use of inhaled steroids: Secondary | ICD-10-CM | POA: Diagnosis not present

## 2020-04-18 DIAGNOSIS — K219 Gastro-esophageal reflux disease without esophagitis: Secondary | ICD-10-CM | POA: Diagnosis not present

## 2020-04-18 DIAGNOSIS — Z801 Family history of malignant neoplasm of trachea, bronchus and lung: Secondary | ICD-10-CM | POA: Insufficient documentation

## 2020-04-18 DIAGNOSIS — E1151 Type 2 diabetes mellitus with diabetic peripheral angiopathy without gangrene: Secondary | ICD-10-CM | POA: Diagnosis not present

## 2020-04-18 DIAGNOSIS — I5082 Biventricular heart failure: Secondary | ICD-10-CM | POA: Diagnosis not present

## 2020-04-18 DIAGNOSIS — F329 Major depressive disorder, single episode, unspecified: Secondary | ICD-10-CM | POA: Insufficient documentation

## 2020-04-18 DIAGNOSIS — I2721 Secondary pulmonary arterial hypertension: Secondary | ICD-10-CM | POA: Insufficient documentation

## 2020-04-18 DIAGNOSIS — Z8041 Family history of malignant neoplasm of ovary: Secondary | ICD-10-CM | POA: Insufficient documentation

## 2020-04-18 DIAGNOSIS — I2781 Cor pulmonale (chronic): Secondary | ICD-10-CM | POA: Diagnosis not present

## 2020-04-18 DIAGNOSIS — I350 Nonrheumatic aortic (valve) stenosis: Secondary | ICD-10-CM | POA: Diagnosis not present

## 2020-04-18 LAB — BASIC METABOLIC PANEL
Anion gap: 14 (ref 5–15)
BUN: 31 mg/dL — ABNORMAL HIGH (ref 8–23)
CO2: 35 mmol/L — ABNORMAL HIGH (ref 22–32)
Calcium: 9.6 mg/dL (ref 8.9–10.3)
Chloride: 91 mmol/L — ABNORMAL LOW (ref 98–111)
Creatinine, Ser: 2.43 mg/dL — ABNORMAL HIGH (ref 0.44–1.00)
GFR calc Af Amer: 23 mL/min — ABNORMAL LOW (ref >60)
GFR calc non Af Amer: 20 mL/min — ABNORMAL LOW (ref >60)
Glucose, Bld: 206 mg/dL — ABNORMAL HIGH (ref 70–99)
Potassium: 3.5 mmol/L (ref 3.5–5.1)
Sodium: 140 mmol/L (ref 135–145)

## 2020-04-18 LAB — BRAIN NATRIURETIC PEPTIDE: B Natriuretic Peptide: 1115.1 pg/mL — ABNORMAL HIGH (ref 0.0–100.0)

## 2020-04-18 NOTE — Progress Notes (Signed)
Patient ID: Lindsay Harmon, female   DOB: 11/18/1955, 64 y.o.   MRN: [id number]    Advanced Heart Failure Clinic Note   PCP: Dr Lindsay Harmon  Primary HF  Cardiologist: Dr Lindsay Harmon Pulmonary: Dr Lindsay Harmon   HPI: Lindsay Harmon is a 64 y.o. female with history of HTN, DM, IBS, depression, GERD, severe COPD (FEV1 0.87L). PAD  and pulmonary hypertension with RV failure/cor pulmonale, and chronic diastolic heart failure.   Admitted in 8/16 with volume overload and severe hyponatremia. Diuresed with IV lasix and transtioned back torsemide and metolazone as needed. Discharge weight 153 pounds.   Admitted to [facility] in 12/16 with volume depletion and hyponatremia with Na 113. Hydrated. Has since followed up with Dr. Anthonette Harmon in [place]. Now on torsemide 20 bid and occasional metolazone.   Admitted to [facility] 6/23-6/28 with acute hypoxic respiratory failure and AKI. Thought to be related to severe pulmonary HTN. Sildenfil was stopped. ACE was stopped due to hypotension. Torsemide was also stopped. Weaned to 4 L nasal cannula. Palliative was consulted.   She has been followed by Dr. [place] Harmon. At one point was on Sildenafil for pulmonary hypertension. I last saw her in 2019. At that time, she was felt to be volume overloaded w/ increased dyspnea. Torsemide was restarted. Sildenafil was stopped as it was thought to be worsening her shunting. She was started on Tyvaso. Unfortunately, she was lost to f/u.   Admitted to [facility] in early 3/21 by Internal Medicine for acute on chronic diastolic/right sided heart failure. She was diuresed w/ IV lasix w/ symptomatic improvement and placed back on PO torsemide, alternating between 40-60 mg qod. She was continued on Tyvaso. 2D echo showed normal LVEF of 15-17%, grade 1 diastolic dysfunction and severe pulmonary hypertension, PA systolic pressure of 616-073 mmH.also showed progression of her aortic stenosis, now moderate to severe, mean gradient measures 31.3 mmHg.  I evaluated  her for post hospital f/u 01/24/20. She was still mildly volume overload and torsemide was further increased to 60 daily. She had return f/u w/ Dr. Haroldine Harmon 03/08/20. Still volume overloaded. BNP was elevated at 1,654. Metolazone 2.5 + Kdur 40 mEq added to regimen every Thrusday.   She returns again for f/u. Wt is down 13 lb since diuretic increase. Feels a bit better. On chronic supp O2 at 4L/min. Respiratory status stable. Denies CP. Compliant w/ meds including Tyvaso.     [place] 01/22/18 Findings: RA = 11 RV = 92/12 PA = 92/33 (57) PCW = 15 Fick cardiac output/index = 5.9/3.3 PVR = 7.2 WU Ao sat = 93% PA sat = 62%, 64%   RHC 12/05/2014 RA 12 mmHg (mean) with O2 sat 72%; RV 97/16 mmHg with O2 sat 73%; PA 97/30 mmHg with O2 sat 67%; PCWP(mean) 14 mmHg (mean); Cardiac Output 5.78 L/min  PFTs  11/2014 with severe COPD  FEV1 0.87 (38%) FVC 1.41 (48%) FEF 25-75 0.41 (19%) Unable to do DLCO  March 2019  Simple spirometry ratio 67% FEV1 1.21 L+ 53% predicted FVC 1.79 L 61% predicted   Echo  11/2014 EF 60-65%, aortic sclerosis, RV moderately dilated/moderately decreased systolic function, PASP 71 mmHg  09/30/2017 LVEF 55-60%, Grade 1 DD, Mild/Mod AS Mean gradient 16 mmHg, Peak PA pressure 115 Hg  01/04/20 Left ventricular ejection fraction, by estimation, is 60 to 65%. The left ventricle has normal function. The left ventricle has no regional wall motion abnormalities. There is mild left ventricular hypertrophy. Left ventricular diastolic parameters are consistent with Grade I diastolic  dysfunction (impaired relaxation). There is the interventricular septum is flattened in systole and diastole, consistent with right ventricular pressure and volume overload. 2. There is severe pulmonary hypertension with PA systolic pressure of 349-179 mmHg plus central venous pressure (which exceeds recorded systemic pressure). Right ventricular systolic function is moderately reduced. The right  ventricular size is severely enlarged. mildly increased right ventricular wall thickness. 3. Right atrial size was moderately dilated. 4. The mitral valve is degenerative. No evidence of mitral valve regurgitation. No evidence of mitral stenosis. 5. Tricuspid valve regurgitation is severe. 6. The aortic valve has an indeterminant number of cusps. Aortic valve regurgitation is not visualized. Moderate to severe aortic valve stenosis. Aortic valve area, by VTI measures 0.60 cm. Aortic valve mean gradient measures 31.3 mmHg. Aortic valve Vmax measures 3.41 m/s.  Review of systems complete and found to be negative unless listed in HPI.    SH:  Social History   Socioeconomic History  . Marital status: Legally Separated    Spouse name: Not on file  . Number of children: 1  . Years of education: Not on file  . Highest education level: Not on file  Occupational History  . 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 Smoking status: Former Smoker    Packs/day: 1.50    Years: 33.00    Pack years: 49.50    Types: Cigarettes    Quit date: 07/28/2005    Years since quitting: 14.7  . Smokeless tobacco: Never Used  Vaping Use  . Vaping Use: Never used  Substance and Sexual Activity  . Alcohol use: No    Alcohol/week: 0.0 standard drinks    Comment: heavy in the past  . Drug use: No  . Sexual activity: Never  Other Topics Concern  . Not on file  Social History Narrative   No living will   Son [name] (then mom) should make decisions for her if she is unable.    Would accept resuscitation attempts but no prolonged ventilation   Not sure about tube feeds but probably wouldn't want them if cognitively unaware   Social Determinants of Health   Financial Resource Strain: Low Risk   . Difficulty of Paying Living Expenses: Not very hard  Food Insecurity:   . Worried About Charity fundraiser in the Last Year:   . Arboriculturist in the Last  Year:   Transportation Needs:   . Film/video editor (Medical):   Marland Kitchen Lack of Transportation (Non-Medical):   Physical Activity:   . Days of Exercise per Week:   . Minutes of Exercise per Session:   Stress:   . Feeling of Stress :   Social Connections:   . Frequency of Communication with Friends and Family:   . Frequency of Social Gatherings with Friends and Family:   . Attends Religious Services:   . Active Member of Clubs or Organizations:   . Attends Archivist Meetings:   Marland Kitchen Marital Status:   Intimate Partner Violence:   . Fear of Current or Ex-Partner:   . Emotionally Abused:   Marland Kitchen Physically Abused:   . Sexually Abused:    FH:  Family History  Problem Relation Age of Onset  . Diabetes Mother   . Cancer Mother        ovarian,melanoma  . Allergies Father   . Cancer Father        lung  . Cancer Maternal  Grandmother        uterine  . Emphysema Paternal Grandfather   . Allergies Sister   . Breast cancer Neg Hx     Past Medical History:  Diagnosis Date  . Allergy   . Aortic atherosclerosis ([place])   . Arthritis   . CKD (chronic kidney disease), stage IV ([place])   . COPD (chronic obstructive pulmonary disease) ([place])    a. 11/2014 PFT's: FEV1 0.87 (38%), FVC 1.41 (48%), FEF 25-75 0.41 (19%). Unable to perform DLCO; b. 12/2017 Simple spirometry ration 67%. FEV1 1.21 L+53% predicted. FVC 1.79L 61% predicted.  . Coronary artery calcification seen on CT scan    a. 03/2017 CT Chest.  . Depression   . Diabetic neuropathy ([place])   . Erythrocytosis 12/13/2019  . Esophageal stricture   . Familial hematuria   . GERD (gastroesophageal reflux disease)   . Hyperlipidemia   . Hypertension   . IBS (irritable bowel syndrome)   . Nephrolithiasis   . NIDDM (non-insulin dependent diabetes mellitus)   . Obesity, unspecified   . Pulmonary hypertension ([place])    a. WHO Group 3; b. 10/2016 Echo: EF 55-60%, no rwma, Gr1 DD, mild to mod AS, mean grad 5mHg, mildly dil RV w/ mildly  reduced RV fxn, mildly dil RA. PASP 1160mg; c. 12/2017 RHC: RA 11, RV 92/12, PA 92/33(57), PCWP 15, Fick CO/CI 5.9/3.3. PVR 7.2 WU. Ao 93%, PA 62/64%.  . Marland KitchenVD (peripheral vascular disease) ([place]   Current Outpatient Medications  Medication Sig Dispense Refill  . albuterol (PROVENTIL) (2.5 MG/3ML) 0.083% nebulizer solution USE 1 VIAL (3ML) BY NEBULIZATION EVERY 6HOURS AS NEEDED FOR WHEEZING OR SHORTNESS OF BREATH 360 mL 0  . albuterol (VENTOLIN HFA) 108 (90 Base) MCG/ACT inhaler USE 2 PUFFS EVERY SIX HOURS AS NEEDED FOR WHEEZING OR SHORTNESS OF BREATH 18 g 5  . ALPRAZolam (XANAX) 0.25 MG tablet Take 1 tablet (0.25 mg total) by mouth daily as needed for anxiety. 30 tablet 0  . aspirin EC 81 MG tablet Take 1 tablet (81 mg total) by mouth daily. 90 tablet 3  . calcitRIOL (ROCALTROL) 0.5 MCG capsule Take 0.5 mcg by mouth daily.     . cholecalciferol (VITAMIN D) 1000 units tablet Take 1,000 Units by mouth at bedtime.     . ferrous sulfate 325 (65 FE) MG tablet Take 325 mg by mouth daily with breakfast.    . gabapentin (NEURONTIN) 300 MG capsule TAKE 1 CAPSULE BY MOUTH IN THE MORNING AND 2 CAPSULES BY MOUTH AT BEDTIME 270 capsule 3  . glucose blood (ONE TOUCH ULTRA TEST) test strip USE 1 STRIP TO TEST BLOOD SUGAR ONCE DAILY Dx Code E11.51 100 each 3  . Insulin Pen Needle (B-D UF III MINI PEN NEEDLES) 31G X 5 MM MISC USE 1 PEN NEEDLE TO INJECT INSULIN 100 each 5  . LANTUS SOLOSTAR 100 UNIT/ML Solostar Pen INJECT 20 UNITS INTO THE SKIN AT BEDTIME (Patient taking differently: 12 Units daily. ) 15 mL 5  . loratadine (CLARITIN) 10 MG tablet Take 10 mg by mouth 2 (two) times daily.    . Marland Kitchenovastatin (MEVACOR) 40 MG tablet Take 2 tablets (80 mg total) by mouth at bedtime. 180 tablet 3  . methadone (DOLOPHINE) 10 MG tablet Take 2 tablets (20 mg total) by mouth every 6 (six) hours as needed. 240 tablet 0  . metolazone (ZAROXOLYN) 2.5 MG tablet Take 1 tablet (2.5 mg total) by mouth once a week. Every Tuesday 5  tablet 3  .  metoprolol tartrate (LOPRESSOR) 25 MG tablet Take 0.5 tablets (12.5 mg total) by mouth 2 (two) times daily. 90 tablet 3  . mometasone (ASMANEX, 120 METERED DOSES,) 220 MCG/INH inhaler Inhale 2 puffs into the lungs 2 (two) times daily. 1 Inhaler 6  . nitroGLYCERIN (NITROSTAT) 0.4 MG SL tablet Place 1 tablet (0.4 mg total) under the tongue every 5 (five) minutes as needed for chest pain. 100 tablet 1  . pantoprazole (PROTONIX) 40 MG tablet Take 1 tablet (40 mg total) by mouth 2 (two) times daily. 180 tablet 3  . potassium chloride SA (KLOR-CON) 20 MEQ tablet Take 2 tablets (40 mEq total) by mouth once a week. Every Tuesday 90 tablet 3  . Respiratory Therapy Supplies (FLUTTER) DEVI Use as directed 1 each 0  . STIOLTO RESPIMAT 2.5-2.5 MCG/ACT AERS INHALE 2 PUFFS BY MOUTH INTO THE LUNGS DAILY. 4 g 3  . torsemide (DEMADEX) 20 MG tablet Take 3 tablets (60 mg total) by mouth daily. 270 tablet 1  . Treprostinil (TYVASO) 0.6 MG/ML SOLN Inhale 18 mcg into the lungs 4 (four) times daily.     No current facility-administered medications for this encounter.   Vitals:   04/18/20 1159  BP: 122/74  Pulse: 77  SpO2: 93%  Weight: 67.1 kg (148 lb)   Wt Readings from Last 3 Encounters:  04/18/20 67.1 kg (148 lb)  03/13/20 69.8 kg (153 lb 14.4 oz)  03/08/20 73.4 kg (161 lb 12.8 oz)    PHYSICAL EXAM: General:  Looks much older than actual age. On supp O2 No respiratory difficulty HEENT: normal Neck: supple. JVD ~9 cm. Carotids 2+ bilat; no bruits. No lymphadenopathy or thyromegaly appreciated. Cor: PMI nondisplaced. Regular rate & rhythm. 3/6 SM, loudest RUSB Lungs: clear Abdomen: soft, nontender, nondistended. No hepatosplenomegaly. No bruits or masses. Good bowel sounds. Extremities: no cyanosis, clubbing, rash, trace bilateral LEE edema + bilateral rubor and decreased DPs Neuro: alert & oriented x 3, cranial nerves grossly intact. moves all 4 extremities w/o difficulty. Affect  pleasant.   ASSESSMENT & PLAN:  1. Chronic Diastolic HF/ RV Failure  - Echo 01/04/20: EF 60-65%. RV severely enlarged w/ moderately reduced systolic function w/ severe pulmonary hypertension with PA systolic pressure of 096-045 mmH - Volume status improved w/ recent diuretic dose increases. Wt down 12 lb - Continue torsemide 60 mg daily  - Continue metolazone 2.5 + Kdur 40 every Thrusday - Repeat BMET and BNP today  - unfortunately, due to PAD, unable to use TED hoses nor unna boots for LEE. - Reinforced importance of fluid restriction and low sodium diet - Continue daily wts.    2. PAH with cor pulmonale   - Peak PA pressure 92 on RHC 01/22/18, has been chronically high.  - Echo w/ severe pulmonary hypertension with PA systolic pressure of 409-811 mmH - likely Who Group 3. Not candidate for selective pulmonary vasodilators - Primarily WHO Group 3 with chronic hypoxic lung disease, but PA pressures out of proportion to her reduction in FEV1. Suspect component of FEV1. - No longer on sildenfail - there was concern for shunting, so will keep her off for now. - Continue Tyvaso. She reports full compliance  - Continue home O2 4L/min baseline   3. Severe COPD/ Chronic Hypoxic Respiratory Failure - on chronic supp O2. Will continue - FVC 1.79 (61%), FEV1 1.21 (53%)  - no change   4. CKD IV - baseline SCr ~1.8-2.2 - Check BMP today   5. Aortic Stenosis, moderte -  stable. No angina or syncope.  - Not a surgical candidate given multiple co morbidities including chronic respiratory failure and severe RV failure. May consider TAVR but not sure it would result in much symptomatic improvement/ improvement in quality of life.    6. PAD - followed by Dr. Fletcher Anon.   F/u w/ Dr. Haroldine Harmon in 2 months or sooner if needed.   Kharter Brew, PA-C  12:12 PM

## 2020-04-18 NOTE — Patient Instructions (Signed)
It was great to see you today! No medication changes are needed at this time.  Labs today We will only contact you if something comes back abnormal or we need to make some changes. Otherwise no news is good news!  Your physician recommends that you schedule a follow-up appointment in: 2-3 months with Dr Haroldine Laws  At the [facility], you and your health needs are our priority. As part of our continuing mission to provide you with exceptional heart care, we have created designated Provider Care Teams. These Care Teams include your primary Cardiologist (physician) and Advanced Practice Providers (APPs- Physician Assistants and Nurse Practitioners) who all work together to provide you with the care you need, when you need it.   You may see any of the following providers on your designated Care Team at your next follow up: Marland Kitchen Dr Glori Bickers . Dr Loralie Champagne . Darrick Grinder, NP . Lyda Jester, PA . Audry Riles, PharmD   Please be sure to bring in all your medications bottles to every appointment.  Marland Kitchen

## 2020-04-20 ENCOUNTER — Ambulatory Visit: Payer: PPO | Admitting: Podiatry

## 2020-04-20 ENCOUNTER — Encounter: Payer: Self-pay | Admitting: Podiatry

## 2020-04-20 ENCOUNTER — Other Ambulatory Visit: Payer: Self-pay

## 2020-04-20 DIAGNOSIS — M79675 Pain in left toe(s): Secondary | ICD-10-CM | POA: Diagnosis not present

## 2020-04-20 DIAGNOSIS — B351 Tinea unguium: Secondary | ICD-10-CM

## 2020-04-20 DIAGNOSIS — M79674 Pain in right toe(s): Secondary | ICD-10-CM | POA: Diagnosis not present

## 2020-04-20 DIAGNOSIS — N184 Chronic kidney disease, stage 4 (severe): Secondary | ICD-10-CM | POA: Diagnosis not present

## 2020-04-20 DIAGNOSIS — L84 Corns and callosities: Secondary | ICD-10-CM

## 2020-04-20 DIAGNOSIS — I739 Peripheral vascular disease, unspecified: Secondary | ICD-10-CM | POA: Diagnosis not present

## 2020-04-20 NOTE — Progress Notes (Signed)
This patient returns to my office for at risk foot care.  This patient requires this care by a professional since this patient will be at risk due to having chronic kidney disease and diabetes with vascular complications and neuropathy.  She was seen 10 days ago and her painful precallus was debrided and padding placed in her shoe.  Patient says her callus right foot has healed with no pain present. This patient is unable to cut nails herself since the patient cannot reach her nails.These nails are painful walking and wearing shoes.  This patient presents for at risk foot care today.  General Appearance  Alert, conversant and in no acute stress.  Vascular  Dorsalis pedis and posterior tibial  pulses are not  palpable  bilaterally.  Capillary return is within normal limits  bilaterally. Cold feet  bilaterally.  Neurologic  Senn-Weinstein monofilament wire test diminished/absent  bilaterally. Muscle power within normal limits bilaterally.  Nails Thick disfigured discolored nails with subungual debris  from hallux to fifth toes bilaterally. No evidence of bacterial infection or drainage bilaterally.  Orthopedic  No limitations of motion  feet .  No crepitus or effusions noted.  No bony pathology or digital deformities noted. Plantar flexed fifth metatarsal right foot.  Skin  normotropic skin with no porokeratosis noted bilaterally.  No signs of infections or ulcers noted.  Healing hemorrhagic callus noted sub 5th right.  No drainage or infection.   Onychomycosis  Pain in right toes  Pain in left toes  Healing callus right foot.  ROV to discuss preulcerous callus right foot.   Mechanical debridement of nails 1-5  bilaterally performed with a nail nipper.  Filed with dremel without incident.    RTC 4 weeks for preulcerous lesion.                 Told patient to return for periodic foot care and evaluation due to potential at risk complications.   Gardiner Barefoot DPM

## 2020-05-05 ENCOUNTER — Ambulatory Visit (INDEPENDENT_AMBULATORY_CARE_PROVIDER_SITE_OTHER): Payer: PPO | Admitting: Internal Medicine

## 2020-05-05 ENCOUNTER — Other Ambulatory Visit: Payer: Self-pay

## 2020-05-05 ENCOUNTER — Encounter: Payer: Self-pay | Admitting: Internal Medicine

## 2020-05-05 DIAGNOSIS — M7022 Olecranon bursitis, left elbow: Secondary | ICD-10-CM | POA: Diagnosis not present

## 2020-05-05 DIAGNOSIS — M5441 Lumbago with sciatica, right side: Secondary | ICD-10-CM

## 2020-05-05 DIAGNOSIS — M7032 Other bursitis of elbow, left elbow: Secondary | ICD-10-CM | POA: Insufficient documentation

## 2020-05-05 DIAGNOSIS — F1120 Opioid dependence, uncomplicated: Secondary | ICD-10-CM | POA: Diagnosis not present

## 2020-05-05 DIAGNOSIS — G8929 Other chronic pain: Secondary | ICD-10-CM | POA: Diagnosis not present

## 2020-05-05 DIAGNOSIS — M25551 Pain in right hip: Secondary | ICD-10-CM | POA: Diagnosis not present

## 2020-05-05 NOTE — Assessment & Plan Note (Signed)
Looks more inflamed than infected Hold off on antibiotic Discussed ice Can use tylenol If worsens, would try antibiotic

## 2020-05-05 NOTE — Assessment & Plan Note (Signed)
Ongoing pain Maintaining her functional independence with methadone 10 tid - qid

## 2020-05-05 NOTE — Assessment & Plan Note (Signed)
PDMP reviewed No concerns 

## 2020-05-05 NOTE — Assessment & Plan Note (Signed)
Could be referred from her back but may be arthritis Can try heat or tylenol

## 2020-05-05 NOTE — Progress Notes (Signed)
Subjective:    Patient ID: Lindsay Harmon, female    DOB: 1956-07-21, 64 y.o.   MRN: [id number]  HPI Here for review of chronic pain and other issues This visit occurred during the SARS-CoV-2 public health emergency.  Safety protocols were in place, including screening questions prior to the visit, additional usage of staff PPE, and extensive cleaning of exam room while observing appropriate contact time as indicated for disinfecting solutions.   Having some trouble with left elbow New issue for 2-3 weeks No injury No new activities Aching even at rest---not restricting her Feels warm at times  Right hip pain Down the leg  Relates that to the back pain Continues on the methadone--tries to get by with three a day (occ 4)  Current Outpatient Medications on File Prior to Visit  Medication Sig Dispense Refill  . albuterol (PROVENTIL) (2.5 MG/3ML) 0.083% nebulizer solution USE 1 VIAL (3ML) BY NEBULIZATION EVERY 6HOURS AS NEEDED FOR WHEEZING OR SHORTNESS OF BREATH 360 mL 0  . albuterol (VENTOLIN HFA) 108 (90 Base) MCG/ACT inhaler USE 2 PUFFS EVERY SIX HOURS AS NEEDED FOR WHEEZING OR SHORTNESS OF BREATH 18 g 5  . ALPRAZolam (XANAX) 0.25 MG tablet Take 1 tablet (0.25 mg total) by mouth daily as needed for anxiety. 30 tablet 0  . aspirin EC 81 MG tablet Take 1 tablet (81 mg total) by mouth daily. 90 tablet 3  . calcitRIOL (ROCALTROL) 0.5 MCG capsule Take 0.5 mcg by mouth daily.     . cholecalciferol (VITAMIN D) 1000 units tablet Take 1,000 Units by mouth at bedtime.     . ferrous sulfate 325 (65 FE) MG tablet Take 325 mg by mouth daily with breakfast.    . gabapentin (NEURONTIN) 300 MG capsule TAKE 1 CAPSULE BY MOUTH IN THE MORNING AND 2 CAPSULES BY MOUTH AT BEDTIME 270 capsule 3  . glucose blood (ONE TOUCH ULTRA TEST) test strip USE 1 STRIP TO TEST BLOOD SUGAR ONCE DAILY Dx Code E11.51 100 each 3  . Insulin Pen Needle (B-D UF III MINI PEN NEEDLES) 31G X 5 MM MISC USE 1 PEN NEEDLE TO INJECT  INSULIN 100 each 5  . LANTUS SOLOSTAR 100 UNIT/ML Solostar Pen INJECT 20 UNITS INTO THE SKIN AT BEDTIME (Patient taking differently: 12 Units daily. ) 15 mL 5  . loratadine (CLARITIN) 10 MG tablet Take 10 mg by mouth 2 (two) times daily.    Marland Kitchen lovastatin (MEVACOR) 40 MG tablet Take 2 tablets (80 mg total) by mouth at bedtime. 180 tablet 3  . methadone (DOLOPHINE) 10 MG tablet Take 2 tablets (20 mg total) by mouth every 6 (six) hours as needed. 240 tablet 0  . metolazone (ZAROXOLYN) 2.5 MG tablet Take 1 tablet (2.5 mg total) by mouth once a week. Every Tuesday 5 tablet 3  . metoprolol tartrate (LOPRESSOR) 25 MG tablet Take 0.5 tablets (12.5 mg total) by mouth 2 (two) times daily. 90 tablet 3  . mometasone (ASMANEX, 120 METERED DOSES,) 220 MCG/INH inhaler Inhale 2 puffs into the lungs 2 (two) times daily. 1 Inhaler 6  . nitroGLYCERIN (NITROSTAT) 0.4 MG SL tablet Place 1 tablet (0.4 mg total) under the tongue every 5 (five) minutes as needed for chest pain. 100 tablet 1  . pantoprazole (PROTONIX) 40 MG tablet Take 1 tablet (40 mg total) by mouth 2 (two) times daily. 180 tablet 3  . potassium chloride SA (KLOR-CON) 20 MEQ tablet Take 2 tablets (40 mEq total) by mouth once a week.  Every Tuesday 90 tablet 3  . Respiratory Therapy Supplies (FLUTTER) DEVI Use as directed 1 each 0  . STIOLTO RESPIMAT 2.5-2.5 MCG/ACT AERS INHALE 2 PUFFS BY MOUTH INTO THE LUNGS DAILY. 4 g 3  . torsemide (DEMADEX) 20 MG tablet Take 3 tablets (60 mg total) by mouth daily. 270 tablet 1  . Treprostinil (TYVASO) 0.6 MG/ML SOLN Inhale 18 mcg into the lungs 4 (four) times daily.     No current facility-administered medications on file prior to visit.    Allergies  Allergen Reactions  . Sertraline Hcl Other (See Comments)    Altered Mental Status    Past Medical History:  Diagnosis Date  . Allergy   . Aortic atherosclerosis ([place])   . Arthritis   . CKD (chronic kidney disease), stage IV ([place])   . COPD (chronic obstructive  pulmonary disease) ([place])    a. 11/2014 PFT's: FEV1 0.87 (38%), FVC 1.41 (48%), FEF 25-75 0.41 (19%). Unable to perform DLCO; b. 12/2017 Simple spirometry ration 67%. FEV1 1.21 L+53% predicted. FVC 1.79L 61% predicted.  . Coronary artery calcification seen on CT scan    a. 03/2017 CT Chest.  . Depression   . Diabetic neuropathy ([place])   . Erythrocytosis 12/13/2019  . Esophageal stricture   . Familial hematuria   . GERD (gastroesophageal reflux disease)   . Hyperlipidemia   . Hypertension   . IBS (irritable bowel syndrome)   . Nephrolithiasis   . NIDDM (non-insulin dependent diabetes mellitus)   . Obesity, unspecified   . Pulmonary hypertension ([place])    a. WHO Group 3; b. 10/2016 Echo: EF 55-60%, no rwma, Gr1 DD, mild to mod AS, mean grad 56mHg, mildly dil RV w/ mildly reduced RV fxn, mildly dil RA. PASP 118mg; c. 12/2017 RHC: RA 11, RV 92/12, PA 92/33(57), PCWP 15, Fick CO/CI 5.9/3.3. PVR 7.2 WU. Ao 93%, PA 62/64%.  . Marland KitchenVD (peripheral vascular disease) ([place]    Past Surgical History:  Procedure Laterality Date  . ABDOMINAL HYSTERECTOMY    . CARDIAC CATHETERIZATION    . CARPAL TUNNEL RELEASE    . CESAREAN SECTION    . ERCP N/A 03/17/2017   Procedure: ENDOSCOPIC RETROGRADE CHOLANGIOPANCREATOGRAPHY (ERCP);  Surgeon: WoLucilla LameMD;  [facility];  Service: Endoscopy;  Laterality: N/A;  . EUS N/A 03/13/2017   Procedure: FULL UPPER ENDOSCOPIC ULTRASOUND (EUS) RADIAL;  Surgeon: Burbridge, ReMurray HodgkinsMD;  [facility];  Service: Endoscopy;  Laterality: N/A;  . RIGHT HEART CATH N/A 01/22/2018   Procedure: RIGHT HEART CATH;  Surgeon: BeJolaine ArtistMD;  [facility];  Service: Cardiovascular;  Laterality: N/A;  . RIGHT HEART CATHETERIZATION N/A 12/05/2014   Procedure: RIGHT HEART CATH;  Surgeon: HeSinclair GroomsMD;  [facility];  Service: Cardiovascular;  Laterality: N/A;    Family History  Problem Relation Age of Onset  . Diabetes  Mother   . Cancer Mother        ovarian,melanoma  . Allergies Father   . Cancer Father        lung  . Cancer Maternal Grandmother        uterine  . Emphysema Paternal Grandfather   . Allergies Sister   . Breast cancer Neg Hx     Social History   Socioeconomic History  . Marital status: Legally Separated    Spouse name: Not on file  . Number of children: 1  . Years of education: Not on file  .  Highest education level: Not on file  Occupational History  . 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 Smoking status: Former Smoker    Packs/day: 1.50    Years: 33.00    Pack years: 49.50    Types: Cigarettes    Quit date: 07/28/2005    Years since quitting: 14.7  . Smokeless tobacco: Never Used  Vaping Use  . Vaping Use: Never used  Substance and Sexual Activity  . Alcohol use: No    Alcohol/week: 0.0 standard drinks    Comment: heavy in the past  . Drug use: No  . Sexual activity: Never  Other Topics Concern  . Not on file  Social History Narrative   No living will   Son [name] (then mom) should make decisions for her if she is unable.    Would accept resuscitation attempts but no prolonged ventilation   Not sure about tube feeds but probably wouldn't want them if cognitively unaware   Social Determinants of Health   Financial Resource Strain: Low Risk   . Difficulty of Paying Living Expenses: Not very hard  Food Insecurity:   . Worried About Charity fundraiser in the Last Year:   . Arboriculturist in the Last Year:   Transportation Needs:   . Film/video editor (Medical):   Marland Kitchen Lack of Transportation (Non-Medical):   Physical Activity:   . Days of Exercise per Week:   . Minutes of Exercise per Session:   Stress:   . Feeling of Stress :   Social Connections:   . Frequency of Communication with Friends and Family:   . Frequency of Social Gatherings with Friends and Family:   . Attends Religious Services:   .  Active Member of Clubs or Organizations:   . Attends Archivist Meetings:   Marland Kitchen Marital Status:   Intimate Partner Violence:   . Fear of Current or Ex-Partner:   . Emotionally Abused:   Marland Kitchen Physically Abused:   . Sexually Abused:    Review of Systems  Breathing is about the same No chest pain Has adjusted her insulin some--had low sugars. Now on 16 again and sugars 114-156  Had plantar callous debrided somewhat by podiatrist     Objective:   Physical Exam Constitutional:      Appearance: Normal appearance.  Musculoskeletal:     Comments: Left elbow has redness and slight warmth at bursa. Normal ROM without pain.   Decreased internal rotation of right hip--not really painful though. No bursa tenderness Left hip fairly normal ROM  Skin:    Comments: Plantar callous without inflammation  Neurological:     Mental Status: She is alert.            Assessment & Plan:

## 2020-05-09 ENCOUNTER — Other Ambulatory Visit: Payer: Self-pay | Admitting: Internal Medicine

## 2020-05-10 ENCOUNTER — Telehealth: Payer: Self-pay

## 2020-05-10 DIAGNOSIS — Z122 Encounter for screening for malignant neoplasm of respiratory organs: Secondary | ICD-10-CM

## 2020-05-10 DIAGNOSIS — Z87891 Personal history of nicotine dependence: Secondary | ICD-10-CM

## 2020-05-10 NOTE — Telephone Encounter (Signed)
Patient has been notified that the low dose lung cancer screening CT scan is due currently or will be in near future.  Confirmed that patient is within the appropriate age range and asymptomatic, (no signs or symptoms of lung cancer).  Patient denies illness that would prevent curative treatment for lung cancer if found.  Patient is agreeable for CT scan being scheduled.    Verified smoking history (former smoker, quit 07/2005 with 33 year 1.5 ppd history).   CT scheduled for 05/24/2020 @ 11:35.

## 2020-05-11 NOTE — Telephone Encounter (Signed)
Smoking history: former,quit 01/04/09, 30 pack year

## 2020-05-11 NOTE — Addendum Note (Signed)
Addended by: Lieutenant Diego on: 05/11/2020 09:12 AM   Modules accepted: Orders

## 2020-05-15 ENCOUNTER — Other Ambulatory Visit: Payer: Self-pay | Admitting: *Deleted

## 2020-05-15 MED ORDER — METHADONE HCL 10 MG PO TABS: 20.0000 mg | ORAL_TABLET | Freq: Four times a day (QID) | ORAL | 0 refills | Status: DC | PRN

## 2020-05-15 NOTE — Telephone Encounter (Signed)
Patient left a voicemail requesting a refill\ Name of Medication: Methadone Name of Pharmacy: [facility] or Written Date and Quantity: 03/28/20 #240 Last Office Visit and Type: 05/05/20 Next Office Visit and Type: 08/04/20 Last Controlled Substance Agreement Date: 09/27/19 Last UDS:09/27/19

## 2020-05-16 ENCOUNTER — Other Ambulatory Visit: Payer: Self-pay

## 2020-05-16 ENCOUNTER — Other Ambulatory Visit: Payer: Self-pay | Admitting: Primary Care

## 2020-05-16 MED ORDER — GLUCOSE BLOOD VI STRP: ORAL_STRIP | 3 refills | Status: DC

## 2020-05-24 ENCOUNTER — Other Ambulatory Visit: Payer: Self-pay

## 2020-05-24 ENCOUNTER — Ambulatory Visit
Admission: RE | Admit: 2020-05-24 | Discharge: 2020-05-24 | Disposition: A | Discharge status: Home / Self Care | Payer: PPO | Source: Ambulatory Visit | Attending: Nurse Practitioner | Admitting: Nurse Practitioner

## 2020-05-24 DIAGNOSIS — Z122 Encounter for screening for malignant neoplasm of respiratory organs: Secondary | ICD-10-CM | POA: Diagnosis not present

## 2020-05-24 DIAGNOSIS — Z87891 Personal history of nicotine dependence: Secondary | ICD-10-CM | POA: Diagnosis not present

## 2020-05-25 ENCOUNTER — Ambulatory Visit: Payer: PPO | Admitting: Podiatry

## 2020-05-25 ENCOUNTER — Encounter: Payer: Self-pay | Admitting: Podiatry

## 2020-05-25 ENCOUNTER — Other Ambulatory Visit: Payer: Self-pay

## 2020-05-25 DIAGNOSIS — E1142 Type 2 diabetes mellitus with diabetic polyneuropathy: Secondary | ICD-10-CM

## 2020-05-25 DIAGNOSIS — N184 Chronic kidney disease, stage 4 (severe): Secondary | ICD-10-CM | POA: Diagnosis not present

## 2020-05-25 DIAGNOSIS — L84 Corns and callosities: Secondary | ICD-10-CM

## 2020-05-25 DIAGNOSIS — I739 Peripheral vascular disease, unspecified: Secondary | ICD-10-CM

## 2020-05-25 NOTE — Progress Notes (Signed)
This patient returns to my office for at risk foot care.  This patient requires this care by a professional since this patient will be at risk due to having chronic kidney disease and diabetes with vascular complications and neuropathy.  She was seen 4 weeks  and her painful precallus was debrided and padding placed in her shoe.  She presents the office today stating that she is having pain and discomfort but there is no drainage bleeding or infection at the site of the painful callus under the outside ball of her right foot.   This patient presents for at risk foot care today.  General Appearance  Alert, conversant and in no acute stress.  Vascular  Dorsalis pedis and posterior tibial  pulses are not  palpable  bilaterally.  Capillary return is within normal limits  bilaterally. Cold feet  bilaterally.  Neurologic  Senn-Weinstein monofilament wire test diminished/absent  bilaterally. Muscle power within normal limits bilaterally.  Nails Thick disfigured discolored nails with subungual debris  from hallux to fifth toes bilaterally. No evidence of bacterial infection or drainage bilaterally.  Orthopedic  No limitations of motion  feet .  No crepitus or effusions noted.  No bony pathology or digital deformities noted. Plantar flexed fifth metatarsal right foot greater than left foot.  Skin  normotropic skin with no porokeratosis noted bilaterally.  No signs of infections or ulcers noted.  Healing hemorrhagic callus noted sub 5th right.  No drainage or infection.   Pre- callus right foot.  ROV to discuss preulcerous callus right foot.  Debridement of callus under the fifth MPJ of the right foot using a #15 blade.  No evidence of any ulceration infection or drainage noted.  Patient was given additional padding for her to place the padding and different shoes.  She is to return to the office in 4 weeks for continued evaluation and treatment.   RTC 4 weeks for preulcerous lesion.                 Told  patient to return for periodic foot care and evaluation due to potential at risk complications.   Gardiner Barefoot DPM

## 2020-05-26 ENCOUNTER — Encounter: Payer: Self-pay | Admitting: *Deleted

## 2020-05-30 ENCOUNTER — Other Ambulatory Visit: Payer: Self-pay

## 2020-05-30 ENCOUNTER — Ambulatory Visit: Payer: PPO

## 2020-05-30 ENCOUNTER — Other Ambulatory Visit: Payer: Self-pay | Admitting: Primary Care

## 2020-05-30 DIAGNOSIS — E1149 Type 2 diabetes mellitus with other diabetic neurological complication: Secondary | ICD-10-CM

## 2020-05-30 DIAGNOSIS — I5032 Chronic diastolic (congestive) heart failure: Secondary | ICD-10-CM

## 2020-05-30 NOTE — Chronic Care Management (AMB) (Signed)
Chronic Care Management Pharmacy  Name: ZHURI KRASS  MRN: [id number] DOB: 18-Jun-1956  Chief Complaint/ HPI  [name],  64 y.o., female presents for their Follow-Up CCM visit with the clinical pharmacist via telephone.  PCP : Venia Carbon, MD   Their chronic conditions include: type 2 diabetes, heart failure, hypertension, PVD, COPD, GERD, chronic renal disease, episodic mood disorder, chronic back pain, pulmonary hypertension  Patient concerns: denies medication concerns/questions  Last CCM visit: 03/02/20, no medication changes   Office Visits:  05/05/20: Silvio Pate - right hip pain, try heat/tylenol, bursitis left elbow inflamed, ice, tylenol, if worsens, antibiotic, refill methadone 10 mg TID-QID  02/02/20: Silvio Pate - cont current meds  Consult Visit:  04/18/20: CHF - weight is down 13 lbs, continue torsemide 60 mg daily, metolazone 2.5 mg and Kdur 40 once weekly  03/08/20: CHF - volume overload, add metolazone 2.5 mg and Kdur 40 every Tuesday, check BMP today and again in 2 weeks   02/29/20: Cardiology - PAD progressing, repeat LE arterial doppler, CHF exacerbation March 2021 - current mild volume overload  01/11/20: Pulmonary - recent hospitalization for hypoxemic resp. failure, improving, cont current meds, rtc 3 months  01/03/20: ED - dyspnea  12/10/19: Pulmonary HTN - try flutter valve for congestion/loosen secretions  Allergies  Allergen Reactions  . Sertraline Hcl Other (See Comments)    Altered Mental Status    Medications: Outpatient Encounter Medications as of 05/30/2020  Medication Sig  . albuterol (PROVENTIL) (2.5 MG/3ML) 0.083% nebulizer solution USE 1 VIAL (3ML) BY NEBULIZATION EVERY 6HOURS AS NEEDED FOR WHEEZING OR SHORTNESS OF BREATH  . ALPRAZolam (XANAX) 0.25 MG tablet Take 1 tablet (0.25 mg total) by mouth daily as needed for anxiety.  Marland Kitchen aspirin EC 81 MG tablet Take 1 tablet (81 mg total) by mouth daily.  . calcitRIOL (ROCALTROL) 0.5 MCG capsule Take  0.5 mcg by mouth daily.   . cholecalciferol (VITAMIN D) 1000 units tablet Take 1,000 Units by mouth at bedtime.   . ferrous sulfate 325 (65 FE) MG tablet Take 325 mg by mouth daily with breakfast.  . gabapentin (NEURONTIN) 300 MG capsule TAKE 1 CAPSULE BY MOUTH IN THE MORNING AND 2 CAPSULES BY MOUTH AT BEDTIME  . glucose blood (ONE TOUCH ULTRA TEST) test strip USE 1 STRIP TO TEST BLOOD SUGAR ONCE DAILY Dx Code E11.51  . Insulin Pen Needle (B-D UF III MINI PEN NEEDLES) 31G X 5 MM MISC USE 1 PEN NEEDLE TO INJECT INSULIN  . LANTUS SOLOSTAR 100 UNIT/ML Solostar Pen INJECT 20 UNITS INTO THE SKIN AT BEDTIME (Patient taking differently: 12 Units daily. )  . loratadine (CLARITIN) 10 MG tablet Take 10 mg by mouth 2 (two) times daily.  Marland Kitchen lovastatin (MEVACOR) 40 MG tablet Take 2 tablets (80 mg total) by mouth at bedtime.  . methadone (DOLOPHINE) 10 MG tablet Take 2 tablets (20 mg total) by mouth every 6 (six) hours as needed.  . metolazone (ZAROXOLYN) 2.5 MG tablet Take 1 tablet (2.5 mg total) by mouth once a week. Every Tuesday  . metoprolol tartrate (LOPRESSOR) 25 MG tablet Take 1/2 tablet by mouth twice a day  . mometasone (ASMANEX, 120 METERED DOSES,) 220 MCG/INH inhaler Inhale 2 puffs into the lungs 2 (two) times daily.  . nitroGLYCERIN (NITROSTAT) 0.4 MG SL tablet Place 1 tablet (0.4 mg total) under the tongue every 5 (five) minutes as needed for chest pain.  . pantoprazole (PROTONIX) 40 MG tablet Take 1 tablet (40 mg total)  by mouth 2 (two) times daily.  . potassium chloride SA (KLOR-CON) 20 MEQ tablet Take 2 tablets (40 mEq total) by mouth once a week. Every Tuesday  . PROAIR HFA 108 (90 Base) MCG/ACT inhaler USE 2 PUFFS EVERY 6 HOURS AS NEEDED FOR WHEEZING OR SHORTNESS OF BREATH  . Respiratory Therapy Supplies (FLUTTER) DEVI Use as directed  . STIOLTO RESPIMAT 2.5-2.5 MCG/ACT AERS INHALE 2 PUFFS BY MOUTH INTO THE LUNGS DAILY.  Marland Kitchen torsemide (DEMADEX) 20 MG tablet Take 3 tablets (60 mg total) by mouth  daily.  . Treprostinil (TYVASO) 0.6 MG/ML SOLN Inhale 18 mcg into the lungs 4 (four) times daily.   No facility-administered encounter medications on file as of 05/30/2020.   Current Diagnosis/Assessment:  SDOH:   Financial Resource Strain: Low Risk   . Difficulty of Paying Living Expenses: Not very hard   Goals    . Increase physical activity     Starting 02/27/2016, I will attempt to do at least 5 minutes of chair exercises daily.     Marland Kitchen Pharmacy Care Plan     CARE PLAN ENTRY  Current Barriers:  . Chronic Disease Management support, education, and care coordination needs related to Diabetes and Heart Failure   Heart Failure . Pharmacist Clinical Goal(s): o Over the next 6 months, patient will work with PharmD and providers to prevent fluid overload and heart failure exacerbations . Current regimen:   Metoprolol tartrate 25 mg - 1/2 tablet twice daily  Torsemide 20 mg - 3 tablets daily  Metolazone 2.5 mg - 1 tablet every Friday  Potassium chloride SA 20 mEq - 2 tablets with metolazone every Friday . Interventions: o Continue current medications o Reviewed home weight monitoring  . Patient self care activities - Over the next 6 months, patient will: o Continue to check weight daily, monitoring for excess fluid. Call if weight gain of 3 pounds over night or 5 pounds in 1 week.  Diabetes . Pharmacist Clinical Goal(s): o Over the next 6 months, patient will work with PharmD and providers to achieve A1c goal <8% . Current regimen:  o Lantus - Inject 17 units every morning . Interventions: o Recommend checking blood glucose 2 hours after first bite of meal and bedtime on occasion to further assess control  . Patient self care activities - Over the next 6 months, patient will: o Check blood sugar once daily, document, and provide at future appointments; As your morning glucose readings are more stable, may try checking at other times of day (before or after meals and  bedtime). o Contact provider with any episodes of hypoglycemia  Additional recommendations: . Recommend Shingrix (2-dose) vaccine from pharmacy  Please see past updates related to this goal by clicking on the "Past Updates" button in the selected goal        Diabetes   Recent Relevant Labs: Lab Results  Component Value Date/Time   HGBA1C 8.3 (H) 01/03/2020 10:32 PM   HGBA1C 8.8 (H) 09/27/2019 03:29 PM   HGBA1C 7.0 03/19/2019 12:00 AM   MICROALBUR 1969m/g creat 12/12/2015 12:00 AM   MICROALBUR 0.8 09/08/2012 09:33 AM    A1c goal < 8% Checking BG: Daily and with symptoms of hypoglycemia  Recent FBG Readings: 100-140 (usually 133, 132 or lower) Hypoglycemia: takes 3-4 glucose tabs Reports a couple of lows this month in the late afternoon, report she adjusted her dinner time and this has resolved   Patient has failed these meds in past: none reported - renal impairment (  CrCL 28 ml/min) Patient is currently controlled (fasting BG) on the following medications:   Lantus - 17 units qAM  Last diabetic eye exam:  Lab Results  Component Value Date/Time   HMDIABEYEEXA No Retinopathy 08/24/2019 12:00 AM    Last diabetic foot exam:  Lab Results  Component Value Date/Time   HMDIABFOOTEX done 09/18/2018 12:00 AM   Assessment: Patient increased insulin from 16 to 17 units since last visit, fasting BG are within goal of < 150 for an A1c goal of < 8%. Due for updated A1c. Due to renal impairment and frequent lows, targeting higher A1c goal of < 8%.  Diet: denies any change in diet; eats breakfast daily, snack at lunch (PB crackers, fruit) and supper; eats at home mostly - loves fresh vegetables, pork or chicken, baked potato, green peas; denies bedtime snacking; drinks - water, tea with artificial sweetener  Plan: Continue current medications; Recommended checking blood glucose after meals and at bedtime on occasion to further assess control.   Heart Failure   Type: Diastolic Last  ejection fraction: 01/04/20: 60-65% NYHA Class: II (slight limitation of activity) AHA HF Stage: C (Heart disease and symptoms present)  Patient has failed these meds in past: none reported Patient is currently controlled on the following medications:   Metoprolol tartrate 25 mg - 1/2 tablet BID   Torsemide 20 mg - 3 tablets daily  Metolazone 2.5 mg - 1 tablet every Friday  Potassium chloride SA 20 mEq - 2 tablets with metolazone every Friday  We discussed: Confirms med changes (started metolazone and potassium chloride once weekly). Checks weight every morning after bathroom, very stable 145-147 lbs, able to state weight cut points for fluid overload (3 lbs in 24 hours, 5 lbs in 1 week)  Plan: Continue current medications; Contact clinic if weight gain.   Chronic Pain   Symptoms: reports pain is fairly constant, primarily in lower back/right hip Patient has failed these meds in past: none  Patient is currently controlled on the following medications:   Gabapentin 300 mg - 1 capsule AM, 2 capsules bedtime  Methadone 10 mg - 2 tablets q6h PRN  CrCl: 28 ml/min (Scr 2.28, Wt 73 kg) We discussed: Methadone -continues to take at least twice a day, on bad days takes takes TID (pain managed by Dr. Silvio Pate); Gabapentin - on max dose per renal function. At last visit she reported nerve pain (burning/tingling in lower legs and right foot) seemed worse lately but reports this has improved on its own today. She has not tried any OTC medications as discussed as last visit such as topical lidocaine cream or patches.   Plan: Continue current medications  Vaccines   Reviewed and discussed patient's vaccination history.    Immunization History  Administered Date(s) Administered  . Fluad Quad(high Dose 65+) 06/17/2019  . Influenza Split 08/13/2011  . Influenza Whole 09/16/2007, 08/17/2009, 07/31/2010  . Influenza, High Dose Seasonal PF 06/17/2019  . Influenza,inj,Quad PF,6+ Mos 07/12/2013,  08/09/2014, 08/10/2015, 07/23/2016, 07/23/2017, 06/19/2018  . PFIZER SARS-COV-2 Vaccination 12/15/2019, 12/29/2019  . Pneumococcal Conjugate-13 11/08/2014  . Pneumococcal Polysaccharide-23 08/05/2002, 02/27/2016  . Td 05/19/2006  . Tdap 04/26/2017  . Zoster 11/23/2014   Plan: Recommended patient receive Shingrix (pt plans to get soon, reports pharmacy was out last time she checked).   Medication Management  Pharmacy/Benefits:[facility]/Mail order & [facility] (slowly switching all over to mail)  Adherence: fills weekly pillbox, no concerns  Affordability: medication costs are affordable/has LIS   CCM Follow  Up: 6 months, telephone  Debbora Dus, PharmD Clinical Pharmacist [facility] [phone]

## 2020-06-01 NOTE — Patient Instructions (Addendum)
Dear Lindsay Harmon,  Below is a summary of the goals we discussed during our follow up appointment on May 30, 2020. Please contact me anytime with questions or concerns.   Visit Information  Goals Addressed            This Visit's Progress   . Pharmacy Care Plan       CARE PLAN ENTRY  Current Barriers:  . Chronic Disease Management support, education, and care coordination needs related to Diabetes and Heart Failure   Heart Failure . Pharmacist Clinical Goal(s): o Over the next 6 months, patient will work with PharmD and providers to prevent fluid overload and heart failure exacerbations . Current regimen:   Metoprolol tartrate 25 mg - 1/2 tablet twice daily  Torsemide 20 mg - 3 tablets daily  Metolazone 2.5 mg - 1 tablet every Friday  Potassium chloride SA 20 mEq - 2 tablets with metolazone every Friday . Interventions: o Continue current medications o Reviewed home weight monitoring  . Patient self care activities - Over the next 6 months, patient will: o Continue to check weight daily, monitoring for excess fluid. Call if weight gain of 3 pounds over night or 5 pounds in 1 week.  Diabetes . Pharmacist Clinical Goal(s): o Over the next 6 months, patient will work with PharmD and providers to achieve A1c goal <8% . Current regimen:  o Lantus - Inject 17 units every morning . Interventions: o Recommend checking blood glucose 2 hours after first bite of meal and bedtime on occasion to further assess control  . Patient self care activities - Over the next 6 months, patient will: o Check blood sugar once daily, document, and provide at future appointments; As your morning glucose readings are more stable, may try checking at other times of day (before or after meals and bedtime). o Contact provider with any episodes of hypoglycemia  Additional recommendations: . Recommend Shingrix (2-dose) vaccine from pharmacy  Please see past updates related to this goal by  clicking on the "Past Updates" button in the selected goal       The patient verbalized understanding of instructions provided today and agreed to receive a mailed copy of patient instruction and/or educational materials.  Telephone follow up appointment with pharmacy team member scheduled for: December 06, 2020 at 1:00 PM (telephone visit). *Please contact Sharyn Lull directly if you need to reschedule.  Debbora Dus, PharmD Clinical Pharmacist [facility] [phone]   Carbohydrate Counting for Diabetes Mellitus, Adult  Carbohydrate counting is a method of keeping track of how many carbohydrates you eat. Eating carbohydrates naturally increases the amount of sugar (glucose) in the blood. Counting how many carbohydrates you eat helps keep your blood glucose within normal limits, which helps you manage your diabetes (diabetes mellitus). It is important to know how many carbohydrates you can safely have in each meal. This is different for every person. A diet and nutrition specialist (registered dietitian) can help you make a meal plan and calculate how many carbohydrates you should have at each meal and snack. Carbohydrates are found in the following foods:  Grains, such as breads and cereals.  Dried beans and soy products.  Starchy vegetables, such as potatoes, peas, and corn.  Fruit and fruit juices.  Milk and yogurt.  Sweets and snack foods, such as cake, cookies, candy, chips, and soft drinks. How do I count carbohydrates? There are two ways to count carbohydrates in food. You can use either of the  methods or a combination of both. Reading "Nutrition Facts" on packaged food The "Nutrition Facts" list is included on the labels of almost all packaged foods and beverages in the [place] It includes:  The serving size.  Information about nutrients in each serving, including the grams (g) of carbohydrate per serving. To use the "Nutrition Facts":  Decide  how many servings you will have.  Multiply the number of servings by the number of carbohydrates per serving.  The resulting number is the total amount of carbohydrates that you will be having. Learning standard serving sizes of other foods When you eat carbohydrate foods that are not packaged or do not include "Nutrition Facts" on the label, you need to measure the servings in order to count the amount of carbohydrates:  Measure the foods that you will eat with a food scale or measuring cup, if needed.  Decide how many standard-size servings you will eat.  Multiply the number of servings by 15. Most carbohydrate-rich foods have about 15 g of carbohydrates per serving. ? For example, if you eat 8 oz (170 g) of strawberries, you will have eaten 2 servings and 30 g of carbohydrates (2 servings x 15 g = 30 g).  For foods that have more than one food mixed, such as soups and casseroles, you must count the carbohydrates in each food that is included. The following list contains standard serving sizes of common carbohydrate-rich foods. Each of these servings has about 15 g of carbohydrates:   hamburger bun or  English muffin.   oz (15 mL) syrup.   oz (14 g) jelly.  1 slice of bread.  1 six-inch tortilla.  3 oz (85 g) cooked rice or pasta.  4 oz (113 g) cooked dried beans.  4 oz (113 g) starchy vegetable, such as peas, corn, or potatoes.  4 oz (113 g) hot cereal.  4 oz (113 g) mashed potatoes or  of a large baked potato.  4 oz (113 g) canned or frozen fruit.  4 oz (120 mL) fruit juice.  4-6 crackers.  6 chicken nuggets.  6 oz (170 g) unsweetened dry cereal.  6 oz (170 g) plain fat-free yogurt or yogurt sweetened with artificial sweeteners.  8 oz (240 mL) milk.  8 oz (170 g) fresh fruit or one small piece of fruit.  24 oz (680 g) popped popcorn. Example of carbohydrate counting Sample meal  3 oz (85 g) chicken breast.  6 oz (170 g) brown rice.  4 oz (113 g)  corn.  8 oz (240 mL) milk.  8 oz (170 g) strawberries with sugar-free whipped topping. Carbohydrate calculation 1. Identify the foods that contain carbohydrates: ? Rice. ? Corn. ? Milk. ? Strawberries. 2. Calculate how many servings you have of each food: ? 2 servings rice. ? 1 serving corn. ? 1 serving milk. ? 1 serving strawberries. 3. Multiply each number of servings by 15 g: ? 2 servings rice x 15 g = 30 g. ? 1 serving corn x 15 g = 15 g. ? 1 serving milk x 15 g = 15 g. ? 1 serving strawberries x 15 g = 15 g. 4. Add together all of the amounts to find the total grams of carbohydrates eaten: ? 30 g + 15 g + 15 g + 15 g = 75 g of carbohydrates total. Summary  Carbohydrate counting is a method of keeping track of how many carbohydrates you eat.  Eating carbohydrates naturally increases the amount of sugar (  glucose) in the blood.  Counting how many carbohydrates you eat helps keep your blood glucose within normal limits, which helps you manage your diabetes.  A diet and nutrition specialist (registered dietitian) can help you make a meal plan and calculate how many carbohydrates you should have at each meal and snack. This information is not intended to replace advice given to you by your health care provider. Make sure you discuss any questions you have with your health care provider. Document Revised: 05/08/2017 Document Reviewed: 03/27/2016 Elsevier Patient Education  [place].

## 2020-06-06 ENCOUNTER — Encounter: Payer: Self-pay | Admitting: Cardiovascular Disease

## 2020-06-06 ENCOUNTER — Ambulatory Visit (INDEPENDENT_AMBULATORY_CARE_PROVIDER_SITE_OTHER): Payer: PPO | Admitting: Cardiovascular Disease

## 2020-06-06 ENCOUNTER — Other Ambulatory Visit: Payer: Self-pay

## 2020-06-06 VITALS — BP 112/60 | HR 75 | Ht 61.0 in | Wt 150.0 lb

## 2020-06-06 DIAGNOSIS — I739 Peripheral vascular disease, unspecified: Secondary | ICD-10-CM

## 2020-06-06 DIAGNOSIS — I359 Nonrheumatic aortic valve disorder, unspecified: Secondary | ICD-10-CM | POA: Diagnosis not present

## 2020-06-06 DIAGNOSIS — E785 Hyperlipidemia, unspecified: Secondary | ICD-10-CM | POA: Diagnosis not present

## 2020-06-06 DIAGNOSIS — I5032 Chronic diastolic (congestive) heart failure: Secondary | ICD-10-CM

## 2020-06-06 MED ORDER — NITROGLYCERIN 0.4 MG SL SUBL: 0.4000 mg | SUBLINGUAL_TABLET | SUBLINGUAL | 1 refills | Status: AC | PRN

## 2020-06-06 NOTE — Patient Instructions (Signed)
Medication Instructions:  Your physician recommends that you continue on your current medications as directed. Please refer to the Current Medication list given to you today.  *If you need a refill on your cardiac medications before your next appointment, please call your pharmacy*   Lab Work: None ordered If you have labs (blood work) drawn today and your tests are completely normal, you will receive your results only by: Marland Kitchen MyChart Message (if you have MyChart) OR . A paper copy in the mail If you have any lab test that is abnormal or we need to change your treatment, we will call you to review the results.   Testing/Procedures: None ordered   Follow-Up: At [facility], you and your health needs are our priority.  As part of our continuing mission to provide you with exceptional heart care, we have created designated Provider Care Teams.  These Care Teams include your primary Cardiologist (physician) and Advanced Practice Providers (APPs -  Physician Assistants and Nurse Practitioners) who all work together to provide you with the care you need, when you need it.  We recommend signing up for the patient portal called "MyChart".  Sign up information is provided on this After Visit Summary.  MyChart is used to connect with patients for Virtual Visits (Telemedicine).  Patients are able to view lab/test results, encounter notes, upcoming appointments, etc.  Non-urgent messages can be sent to your provider as well.   To learn more about what you can do with MyChart, go to [email or web address].    Your next appointment:   6 month(s)  The format for your next appointment:   In Person  Provider:    You may see Dr. Fletcher Anon or one of the following Advanced Practice Providers on your designated Care Team:    Murray Hodgkins, NP  Christell Faith, PA-C  Marrianne Mood, PA-C    Other Instructions N/A

## 2020-06-06 NOTE — Progress Notes (Signed)
Cardiology Office Note   Date:  06/06/2020   ID:  Lindsay Harmon, DOB 1956/03/16, MRN [id number]  PCP:  Lindsay Carbon, MD  Cardiologist:   Lindsay Sacramento, MD   Chief Complaint  Patient presents with  . OTher    3 month follow up. Patient c./o some SOB at times and some swelling at times. Meds reviewed verbally with patient.       History of Present Illness: Lindsay Harmon is a 64 y.o. female who is here today for follow-up visit regarding peripheral arterial disease. She has known history of essential hypertension, diabetes mellitus, irritable bowel syndrome, previous tobacco use, depression, GERD, severe COPD, severe pulmonary hypertension with RV failure, moderate to severe aortic stenosis, carotid disease status post left carotid endarterectomy, chronic kidney disease with GFR of 22 and chronic diastolic heart failure.  She is followed by the heart failure clinic with Dr. Haroldine Harmon.  She was seen last year for bilateral leg claudication worse on the left side with chronic discoloration of both feet.  No ulceration. She underwent recent noninvasive vascular studies which showed an ABI of 0.78 on the right and 0.73 on the left.  Duplex showed moderate diffuse right SFA disease and occluded mid left SFA.  She is limited by significant dyspnea and uses 4 or 5 L of oxygen.    She complained of worsening bilateral leg claudication and thus I repeated her ABI in June which overall was stable at 0.88 on the right and 0.87 on the left.  She reports stable symptoms overall.  She continues to be limited by shortness of breath.   Past Medical History:  Diagnosis Date  . Allergy   . Aortic atherosclerosis ([place])   . Arthritis   . CKD (chronic kidney disease), stage IV (Lindsay Harmon)   . COPD (chronic obstructive pulmonary disease) ([place])    a. 11/2014 PFT's: FEV1 0.87 (38%), FVC 1.41 (48%), FEF 25-75 0.41 (19%). Unable to perform DLCO; b. 12/2017 Simple spirometry ration 67%. FEV1 1.21 L+53%  predicted. FVC 1.79L 61% predicted.  . Coronary artery calcification seen on CT scan    a. 03/2017 CT Chest.  . Depression   . Diabetic neuropathy (Lindsay Harmon)   . Erythrocytosis 12/13/2019  . Esophageal stricture   . Familial hematuria   . GERD (gastroesophageal reflux disease)   . Hyperlipidemia   . Hypertension   . IBS (irritable bowel syndrome)   . Nephrolithiasis   . NIDDM (non-insulin dependent diabetes mellitus)   . Obesity, unspecified   . Pulmonary hypertension (Lindsay Harmon)    a. WHO Group 3; b. 10/2016 Echo: EF 55-60%, no rwma, Gr1 DD, mild to mod AS, mean grad 57mHg, mildly dil RV w/ mildly reduced RV fxn, mildly dil RA. PASP 1148mg; c. 12/2017 RHC: RA 11, RV 92/12, PA 92/33(57), PCWP 15, Fick CO/CI 5.9/3.3. PVR 7.2 WU. Ao 93%, PA 62/64%.  . Marland KitchenVD (peripheral vascular disease) ([facility]    Past Surgical History:  Procedure Laterality Date  . ABDOMINAL HYSTERECTOMY    . CARDIAC CATHETERIZATION    . CARPAL TUNNEL RELEASE    . CESAREAN SECTION    . ERCP N/A 03/17/2017   Procedure: ENDOSCOPIC RETROGRADE CHOLANGIOPANCREATOGRAPHY (ERCP);  Surgeon: Lindsay LameMD;  [facility];  Service: Endoscopy;  Laterality: N/A;  . EUS N/A 03/13/2017   Procedure: FULL UPPER ENDOSCOPIC ULTRASOUND (EUS) RADIAL;  Surgeon: Burbridge, Lindsay HodgkinsMD;  Location: [facility] ENDOSCOPY;  Service: Endoscopy;  Laterality: N/A;  . RIGHT HEART CATH  N/A 01/22/2018   Procedure: RIGHT HEART CATH;  Surgeon: Lindsay Artist, MD;  Location: [facility] CV LAB;  Service: Cardiovascular;  Laterality: N/A;  . RIGHT HEART CATHETERIZATION N/A 12/05/2014   Procedure: RIGHT HEART CATH;  Surgeon: Lindsay Grooms, MD;  Location: [facility] CATH LAB;  Service: Cardiovascular;  Laterality: N/A;     Current Outpatient Medications  Medication Sig Dispense Refill  . albuterol (PROVENTIL) (2.5 MG/3ML) 0.083% nebulizer solution USE 1 VIAL (3ML) BY NEBULIZATION EVERY 6HOURS AS NEEDED FOR WHEEZING OR SHORTNESS OF BREATH 360 mL 0  .  ALPRAZolam (XANAX) 0.25 MG tablet Take 1 tablet (0.25 mg total) by mouth daily as needed for anxiety. 30 tablet 0  . aspirin EC 81 MG tablet Take 1 tablet (81 mg total) by mouth daily. 90 tablet 3  . calcitRIOL (ROCALTROL) 0.5 MCG capsule Take 0.5 mcg by mouth daily.     . cholecalciferol (VITAMIN D) 1000 units tablet Take 1,000 Units by mouth at bedtime.     . ferrous sulfate 325 (65 FE) MG tablet Take 325 mg by mouth daily with breakfast.    . gabapentin (NEURONTIN) 300 MG capsule TAKE 1 CAPSULE BY MOUTH IN THE MORNING AND 2 CAPSULES BY MOUTH AT BEDTIME 270 capsule 3  . glucose blood (ONE TOUCH ULTRA TEST) test strip USE 1 STRIP TO TEST BLOOD SUGAR ONCE DAILY Dx Code E11.51 100 each 3  . Insulin Pen Needle (B-D UF III MINI PEN NEEDLES) 31G X 5 MM MISC USE 1 PEN NEEDLE TO INJECT INSULIN 100 each 5  . LANTUS SOLOSTAR 100 UNIT/ML Solostar Pen INJECT 20 UNITS INTO THE SKIN AT BEDTIME (Patient taking differently: 12 Units daily. ) 15 mL 5  . loratadine (CLARITIN) 10 MG tablet Take 10 mg by mouth 2 (two) times daily.    Marland Kitchen lovastatin (MEVACOR) 40 MG tablet Take 2 tablets (80 mg total) by mouth at bedtime. 180 tablet 3  . methadone (DOLOPHINE) 10 MG tablet Take 2 tablets (20 mg total) by mouth every 6 (six) hours as needed. 240 tablet 0  . metolazone (ZAROXOLYN) 2.5 MG tablet Take 1 tablet (2.5 mg total) by mouth once a week. Every Tuesday 5 tablet 3  . metoprolol tartrate (LOPRESSOR) 25 MG tablet Take 1/2 tablet by mouth twice a day 90 tablet 3  . mometasone (ASMANEX, 120 METERED DOSES,) 220 MCG/INH inhaler Inhale 2 puffs into the lungs 2 (two) times daily. 1 Inhaler 6  . nitroGLYCERIN (NITROSTAT) 0.4 MG SL tablet Place 1 tablet (0.4 mg total) under the tongue every 5 (five) minutes as needed for chest pain. 100 tablet 1  . pantoprazole (PROTONIX) 40 MG tablet Take 1 tablet (40 mg total) by mouth 2 (two) times daily. 180 tablet 3  . potassium chloride SA (KLOR-CON) 20 MEQ tablet Take 2 tablets (40 mEq  total) by mouth once a week. Every Tuesday 90 tablet 3  . PROAIR HFA 108 (90 Base) MCG/ACT inhaler USE 2 PUFFS EVERY 6 HOURS AS NEEDED FOR WHEEZING OR SHORTNESS OF BREATH 8.5 g 3  . Respiratory Therapy Supplies (FLUTTER) DEVI Use as directed 1 each 0  . STIOLTO RESPIMAT 2.5-2.5 MCG/ACT AERS INHALE 2 PUFFS BY MOUTH INTO THE LUNGS DAILY. 4 g 3  . torsemide (DEMADEX) 20 MG tablet Take 3 tablets (60 mg total) by mouth daily. 270 tablet 1  . Treprostinil (TYVASO) 0.6 MG/ML SOLN Inhale 18 mcg into the lungs 4 (four) times daily.     No current facility-administered  medications for this visit.    Allergies:   Sertraline hcl    Social History:  The patient  reports that she quit smoking about 14 years ago. Her smoking use included cigarettes. She has a 49.50 pack-year smoking history. She has never used smokeless tobacco. She reports that she does not drink alcohol and does not use drugs.   Family History:  The patient's family history includes Allergies in her father and sister; Cancer in her father, maternal grandmother, and mother; Diabetes in her mother; Emphysema in her paternal grandfather.    ROS:  Please see the history of present illness.   Otherwise, review of systems are positive for none.   All other systems are reviewed and negative.    PHYSICAL EXAM: VS:  BP 112/60 (BP Location: Right Arm, Patient Position: Sitting, Cuff Size: Normal)   Pulse 75   Ht _0  (1.549 m)   Wt 150 lb (68 kg)   SpO2 90%   BMI 28.34 kg/m  , BMI Body mass index is 28.34 kg/m. GEN: Frail looking female in no acute distress.  On supplemental oxygen. HEENT: normal  Neck: no JVD.  Positive carotid bruits Cardiac: RRR; no  rubs, or gallops.  3 out of 6 crescendo decrescendo systolic murmur in the aortic area which is late peaking with diminished S2.  There is trace bilateral leg edema Respiratory:   Diminished breath sounds bilaterally GI: soft, nontender, nondistended, + BS MS: no deformity or atrophy    Skin: warm and dry, no rash Neuro:  Strength and sensation are intact Psych: euthymic mood, full affect Vascular: Distal pulses are not palpable.  She has  dependent rubor but she also has significant varicose veins with chronic discoloration.   EKG:  EKG is not ordered today.   Recent Labs: 06/17/2019: Pro B Natriuretic peptide (BNP) 962.0 10/27/2019: ALT 22 01/03/2020: TSH 1.613 01/04/2020: Magnesium 1.9 03/13/2020: Hemoglobin 15.9; Platelets 244 04/18/2020: B Natriuretic Peptide 1,115.1; BUN 31; Creatinine, Ser 2.43; Potassium 3.5; Sodium 140    Lipid Panel    Component Value Date/Time   CHOL 143 01/03/2020 2232   TRIG 59 01/03/2020 2232   HDL 58 01/03/2020 2232   CHOLHDL 2.5 01/03/2020 2232   VLDL 12 01/03/2020 2232   LDLCALC 73 01/03/2020 2232   LDLDIRECT 65.0 10/23/2016 1044      Wt Readings from Last 3 Encounters:  06/06/20 150 lb (68 kg)  05/24/20 146 lb (66.2 kg)  05/05/20 147 lb (66.7 kg)        PAD Screen 03/23/2019  Previous PAD dx? No  Previous surgical procedure? No  Pain with walking? No  Subsides with rest? No  Feet/toe relief with dangling? No  Painful, non-healing ulcers? No  Extremities discolored? Yes      ASSESSMENT AND PLAN:  1.  Peripheral arterial disease: Moderately reduced ABI bilaterally with bilateral SFA disease.  Recent ABI was stable compared to before.  She has no evidence of critical limb ischemia.  she is very high risk for any angiography or intervention given underlying cardiopulmonary disease and also advanced chronic kidney disease with high chance of contrast-induced nephropathy which would be detrimental considering her other comorbidities.  We will follow her closely and reserve angiography for critical limb ischemia.  2.  Severe COPD with pulmonary hypertension: Followed by Dr. Haroldine Harmon and Dr. Lake Bells.  3.  Chronic diastolic heart failure: She seems to be mildly volume overloaded on current dose of torsemide.  The  swelling in the right  leg might be causing some of the discomfort.  4.  Hyperlipidemia: Continue treatment with lovastatin.  Most recent LDL was 54.  5.  Moderate severe aortic stenosis: I do not think she will be a candidate for TAVR considering her comorbidities.   Disposition:   FU with me in 6 months  Signed,  Lindsay Sacramento, MD  06/06/2020 2:33 PM    [place]

## 2020-06-07 ENCOUNTER — Telehealth: Payer: Self-pay | Admitting: Pulmonary Disease

## 2020-06-07 NOTE — Telephone Encounter (Signed)
I do not see that we prescribed Tyvaso  It is listed as historical med  Called number provided and followed prompts- placed on hold x 8 min- WCB

## 2020-06-08 NOTE — Telephone Encounter (Signed)
Per the OV note from 03/11/18 Dr. Lake Bells prescribed Tyvaso. Per Dr. Judson Roch last [place] note he advised pt to continue this medication. Called [facility] and spoke with rep, Matt. [facility] and stated since starting Tyvaso she has noticed an increase in bruising. Per Catalina Antigua, there is a moderate drug to drug interaction between ASA and Tyvaso, causing an increase effectiveness of ASA. Pt is taking ASA 71m. Pt will still receive medication refill, no need to call [facility].   Dr. OAnder Slade please advise if you would like to change anything. Thanks.

## 2020-06-09 ENCOUNTER — Telehealth: Payer: Self-pay

## 2020-06-09 NOTE — Telephone Encounter (Signed)
Pt left v/m that pt was incorrect that pt does not need methadone now and will cb later when needs refill of methadone. Pt request to disregard last call. FYI to [facility] CMA and [facility] CMA.

## 2020-06-09 NOTE — Telephone Encounter (Signed)
Left message on voicemail requesting refill of Methadone, advised pt last Rx was filled 05/15/2020 so is too soon... pt stated that she does not believe she pick up that Rx and she will be going to the pharmacy and she will see if they have the Rx... If they do not have the Rx she will call the office back to request refill

## 2020-06-13 ENCOUNTER — Inpatient Hospital Stay: Payer: PPO | Admitting: Oncology

## 2020-06-13 ENCOUNTER — Inpatient Hospital Stay: Payer: PPO | Attending: Oncology

## 2020-06-16 NOTE — Telephone Encounter (Signed)
No change  Aspirin can always be stopped but that means you lose the benefit of using aspirin- -there is no replacement medication for it  If she is significantly bothered by the bruising she may stop

## 2020-06-19 NOTE — Telephone Encounter (Signed)
Called patient let her know Dr. Andy Gauss recommendations. Patient voiced understanding, nothing further needed at this time.

## 2020-06-20 ENCOUNTER — Other Ambulatory Visit: Payer: Self-pay | Admitting: Internal Medicine

## 2020-06-22 ENCOUNTER — Ambulatory Visit: Payer: PPO | Admitting: Podiatry

## 2020-06-22 NOTE — Telephone Encounter (Signed)
Please advise.  methadone 10 mg Last prescribed on 05/15/2020 #240 Last OV (follow up ) on 05/05/2020   No future OV scheduled

## 2020-06-26 ENCOUNTER — Encounter: Payer: Self-pay | Admitting: Podiatry

## 2020-06-26 ENCOUNTER — Ambulatory Visit: Payer: PPO | Admitting: Podiatry

## 2020-06-26 ENCOUNTER — Other Ambulatory Visit: Payer: Self-pay

## 2020-06-26 DIAGNOSIS — M79674 Pain in right toe(s): Secondary | ICD-10-CM

## 2020-06-26 DIAGNOSIS — E1142 Type 2 diabetes mellitus with diabetic polyneuropathy: Secondary | ICD-10-CM

## 2020-06-26 DIAGNOSIS — N184 Chronic kidney disease, stage 4 (severe): Secondary | ICD-10-CM

## 2020-06-26 DIAGNOSIS — M79675 Pain in left toe(s): Secondary | ICD-10-CM | POA: Diagnosis not present

## 2020-06-26 DIAGNOSIS — B351 Tinea unguium: Secondary | ICD-10-CM | POA: Diagnosis not present

## 2020-06-26 DIAGNOSIS — L84 Corns and callosities: Secondary | ICD-10-CM

## 2020-06-26 DIAGNOSIS — I739 Peripheral vascular disease, unspecified: Secondary | ICD-10-CM

## 2020-06-26 NOTE — Progress Notes (Addendum)
This patient presents  to my office for at risk foot care.  This patient requires this care by a professional since this patient will be at risk due to having CKD-4, diabetes with vascular complications and diabetes neuropathy.This patient is unable to cut nails himself since the patient cannot reach her nails.These nails are painful walking and wearing shoes.  This patient presents for at risk foot care today.  She also has painful callus under the outside ball of her right foot.  General Appearance  Alert, conversant and in no acute stress.  Vascular  Dorsalis pedis and posterior tibial  pulses are not  palpable  bilaterally.  Capillary return is within normal limits  bilaterally. Temperature is within normal limits  bilaterally.  Neurologic  Senn-Weinstein monofilament wire test diminished/absent   bilaterally. Muscle power within normal limits bilaterally.  Nails Thick disfigured discolored nails with subungual debris  from hallux to fifth toes bilaterally. No evidence of bacterial infection or drainage bilaterally.  Orthopedic  No limitations of motion  feet .  No crepitus or effusions noted.  No bony pathology or digital deformities noted.  Plantarflexed fifth metatarsal head right foot.    Skin  normotropic skin with no porokeratosis noted bilaterally.  No signs of infections or ulcers noted.   Callus sub 5th right  Onychomycosis  Pain in right toes  Pain in left toes  Callus sub 5th right foot.  Consent was obtained for treatment procedures.   Mechanical debridement of nails 1-5  bilaterally performed with a nail nipper.  Filed with dremel without incident. No infection or ulcer.  Porokeratosis debrided with #15 blade and dremel tool.  No incidents noted.  Patient has worn down her padding.  She was told to apply new padding.  Return office visit    3 months                  Told patient to return for periodic foot care and evaluation due to potential at risk complications.   Gardiner Barefoot DPM

## 2020-06-28 ENCOUNTER — Emergency Department
Admission: EM | Admit: 2020-06-28 | Discharge: 2020-06-28 | Disposition: A | Discharge status: Home / Self Care | Payer: PPO | Attending: Emergency Medicine | Admitting: Emergency Medicine

## 2020-06-28 ENCOUNTER — Other Ambulatory Visit: Payer: Self-pay

## 2020-06-28 ENCOUNTER — Telehealth: Payer: Self-pay

## 2020-06-28 ENCOUNTER — Encounter: Payer: Self-pay | Admitting: Emergency Medicine

## 2020-06-28 DIAGNOSIS — I5032 Chronic diastolic (congestive) heart failure: Secondary | ICD-10-CM | POA: Insufficient documentation

## 2020-06-28 DIAGNOSIS — Z79899 Other long term (current) drug therapy: Secondary | ICD-10-CM | POA: Diagnosis not present

## 2020-06-28 DIAGNOSIS — Z87891 Personal history of nicotine dependence: Secondary | ICD-10-CM | POA: Insufficient documentation

## 2020-06-28 DIAGNOSIS — J441 Chronic obstructive pulmonary disease with (acute) exacerbation: Secondary | ICD-10-CM | POA: Insufficient documentation

## 2020-06-28 DIAGNOSIS — R040 Epistaxis: Secondary | ICD-10-CM | POA: Diagnosis not present

## 2020-06-28 DIAGNOSIS — Z7982 Long term (current) use of aspirin: Secondary | ICD-10-CM | POA: Insufficient documentation

## 2020-06-28 DIAGNOSIS — E1121 Type 2 diabetes mellitus with diabetic nephropathy: Secondary | ICD-10-CM | POA: Diagnosis not present

## 2020-06-28 DIAGNOSIS — Z794 Long term (current) use of insulin: Secondary | ICD-10-CM | POA: Diagnosis not present

## 2020-06-28 DIAGNOSIS — J9611 Chronic respiratory failure with hypoxia: Secondary | ICD-10-CM | POA: Diagnosis not present

## 2020-06-28 DIAGNOSIS — E1122 Type 2 diabetes mellitus with diabetic chronic kidney disease: Secondary | ICD-10-CM | POA: Diagnosis not present

## 2020-06-28 DIAGNOSIS — N184 Chronic kidney disease, stage 4 (severe): Secondary | ICD-10-CM | POA: Diagnosis not present

## 2020-06-28 DIAGNOSIS — I130 Hypertensive heart and chronic kidney disease with heart failure and stage 1 through stage 4 chronic kidney disease, or unspecified chronic kidney disease: Secondary | ICD-10-CM | POA: Insufficient documentation

## 2020-06-28 MED ORDER — METHADONE HCL 10 MG PO TABS: ORAL_TABLET | ORAL | 0 refills | Status: DC

## 2020-06-28 NOTE — Discharge Instructions (Addendum)
As we discussed, there are several techniques you can use to prevent or stop nosebleeds in the future.  Keep your nose moist either with saline spray several times a day or by applying a thin layer of Neosporin, bacitracin, or other antibiotic ointment to the inside of your nose once or twice a day.  If the bleeding starts up again, gently blow your nose into a tissue to clear the blood and clots, then apply 1-2 sprays to each affected nostril of over-the-counter Afrin nasal spray (oxymetazoline).   Then squeeze your nose shut tightly and DO NOT PEEK for at least 15 minutes.  This will resolve most nosebleeds.  If you continue to have trouble after trying these techniques, or anything seems out of the ordinary or concerns you, please return tot he Emergency Department.

## 2020-06-28 NOTE — ED Triage Notes (Signed)
Presents with nose bleed  States her nose has been bleeding all night

## 2020-06-28 NOTE — ED Notes (Signed)
Pt O2 sats low in triage, 87% on 5Lnc via patient's personal tank. Pt reports wearing 5Lnc while moving and 4L while sitting at home. This RN placed patient on 6Lnc and patient's sats improved from 87% to 95%

## 2020-06-28 NOTE — Telephone Encounter (Signed)
Rx sent 

## 2020-06-28 NOTE — ED Provider Notes (Signed)
[facility] Emergency Department Provider Note   ____________________________________________   First MD Initiated Contact with Patient 06/28/20 [phone]     (approximate)  I have reviewed the triage vital signs and the nursing notes.   HISTORY  Chief Complaint Epistaxis    HPI Lindsay Harmon is a 64 y.o. female history of CKD, COPD, coronary disease, pulmonary hypertension COPD.  Patient uses 4 L nasal cannula baseline  About 10 AM last night she began to have bleeding from the right side the nose.  She tried to use compression to relieve it, seem to consistently or persistently want to bleed   The bleeding has since stopped.  She denies any recent illness.  She denies any shortness of breath.  She reports she had bleeding once before and it was cauterized.  She was told it may be because of her oxygen use.  She does not take any blood thinners.  Takes a baby aspirin daily.  No fevers or chills.  Denies any chest pain.  Had some blood drawn down the back of her throat and a few clots earlier, these have improved.  She denies any difficulty with breathing, and is on her normal oxygen level at this time.  Past Medical History:  Diagnosis Date  . Allergy   . Aortic atherosclerosis ([place])   . Arthritis   . CKD (chronic kidney disease), stage IV ([place])   . COPD (chronic obstructive pulmonary disease) ([place])    a. 11/2014 PFT's: FEV1 0.87 (38%), FVC 1.41 (48%), FEF 25-75 0.41 (19%). Unable to perform DLCO; b. 12/2017 Simple spirometry ration 67%. FEV1 1.21 L+53% predicted. FVC 1.79L 61% predicted.  . Coronary artery calcification seen on CT scan    a. 03/2017 CT Chest.  . Depression   . Diabetic neuropathy ([place])   . Erythrocytosis 12/13/2019  . Esophageal stricture   . Familial hematuria   . GERD (gastroesophageal reflux disease)   . Hyperlipidemia   . Hypertension   . IBS (irritable bowel syndrome)   . Nephrolithiasis   . NIDDM (non-insulin dependent  diabetes mellitus)   . Obesity, unspecified   . Pulmonary hypertension ([place])    a. WHO Group 3; b. 10/2016 Echo: EF 55-60%, no rwma, Gr1 DD, mild to mod AS, mean grad 75mHg, mildly dil RV w/ mildly reduced RV fxn, mildly dil RA. PASP 111mg; c. 12/2017 RHC: RA 11, RV 92/12, PA 92/33(57), PCWP 15, Fick CO/CI 5.9/3.3. PVR 7.2 WU. Ao 93%, PA 62/64%.  . Marland KitchenVD (peripheral vascular disease) ([facility]    Patient Active Problem List   Diagnosis Date Noted  . Bursitis of left elbow 05/05/2020  . Right hip pain 05/05/2020  . Pre-ulcerative calluses 04/10/2020  . Pain due to onychomycosis of toenails of both feet 01/13/2020  . Porokeratosis 01/13/2020  . Erythrocytosis 12/13/2019  . Senile purpura (HCHighland Meadows11/30/2020  . Foot pain, bilateral 03/03/2019  . Aortic atherosclerosis (HCStryker  . Narcotic dependence (HCFrederick09/26/2018  . Encounter for chronic pain management 03/21/2017  . Cor pulmonale, chronic (HCMarion  . Pulmonary hypertension (HCVictoria  . RVF (right ventricular failure) (HCFort Payne03/22/2016  . Chronic respiratory failure with hypoxia (HCPort [place]02/18/2016  . COPD (chronic obstructive pulmonary disease) (HCMustang02/18/2016  . Chronic diastolic heart failure (HCPlaya Fortuna02/05/2015  . Chronic renal disease, stage IV (HCSalem02/01/2015  . Advance directive discussed with patient 11/08/2014  . DM (diabetes mellitus), type 2, uncontrolled, periph vascular complic (HCMalcom0520/07/711. Diabetes, polyneuropathy (HCCedar Highlands  09/15/2013  . Routine general medical examination at a health care facility 08/13/2011  . Atherosclerotic heart disease of native coronary artery with angina pectoris ([place]) 02/27/2007  . Chronic back pain 02/27/2007  . Type 2 diabetes mellitus with neurological manifestations, controlled ([place]) 02/26/2007  . Episodic mood disorder ([place]) 02/26/2007  . Essential hypertension, benign 02/26/2007  . PVD (peripheral vascular disease) ([place]) 02/26/2007  . GERD 02/26/2007  . DEGENERATIVE JOINT DISEASE 02/26/2007     Past Surgical History:  Procedure Laterality Date  . ABDOMINAL HYSTERECTOMY    . CARDIAC CATHETERIZATION    . CARPAL TUNNEL RELEASE    . CESAREAN SECTION    . ERCP N/A 03/17/2017   Procedure: ENDOSCOPIC RETROGRADE CHOLANGIOPANCREATOGRAPHY (ERCP);  Surgeon: [name], MD;  Location: [facility] ENDOSCOPY;  Service: Endoscopy;  Laterality: N/A;  . EUS N/A 03/13/2017   Procedure: FULL UPPER ENDOSCOPIC ULTRASOUND (EUS) RADIAL;  Surgeon: Burbridge, Murray Hodgkins, MD;  Location: [facility] ENDOSCOPY;  Service: Endoscopy;  Laterality: N/A;  . RIGHT HEART CATH N/A 01/22/2018   Procedure: RIGHT HEART CATH;  Surgeon: Jolaine Artist, MD;  Location: [place] CV LAB;  Service: Cardiovascular;  Laterality: N/A;  . RIGHT HEART CATHETERIZATION N/A 12/05/2014   Procedure: RIGHT HEART CATH;  Surgeon: Sinclair Grooms, MD;  Location: [facility] CATH LAB;  Service: Cardiovascular;  Laterality: N/A;    Prior to Admission medications   Medication Sig Start Date End Date Taking? Authorizing Provider  albuterol (PROVENTIL) (2.5 MG/3ML) 0.083% nebulizer solution USE 1 VIAL (3ML) BY NEBULIZATION EVERY 6HOURS AS NEEDED FOR WHEEZING OR SHORTNESS OF BREATH 11/10/19   Martyn Ehrich, NP  ALPRAZolam Duanne Moron) 0.25 MG tablet Take 1 tablet (0.25 mg total) by mouth daily as needed for anxiety. 07/24/18   Venia Carbon, MD  aspirin EC 81 MG tablet Take 1 tablet (81 mg total) by mouth daily. 03/23/19   Wellington Hampshire, MD  calcitRIOL (ROCALTROL) 0.5 MCG capsule Take 0.5 mcg by mouth daily.  02/03/17   [provider]  cholecalciferol (VITAMIN D) 1000 units tablet Take 1,000 Units by mouth at bedtime.     [provider]  ferrous sulfate 325 (65 FE) MG tablet Take 325 mg by mouth daily with breakfast.    [provider]  gabapentin (NEURONTIN) 300 MG capsule TAKE 1 CAPSULE BY MOUTH IN THE MORNING AND 2 CAPSULES BY MOUTH AT BEDTIME 03/10/20   Viviana Simpler I, MD  glucose blood (ONE TOUCH ULTRA TEST) test strip  USE 1 STRIP TO TEST BLOOD SUGAR ONCE DAILY Dx Code E11.51 05/16/20   Viviana Simpler I, MD  Insulin Pen Needle (B-D UF III MINI PEN NEEDLES) 31G X 5 MM MISC USE 1 PEN NEEDLE TO INJECT INSULIN 01/11/20   Venia Carbon, MD  LANTUS SOLOSTAR 100 UNIT/ML Solostar Pen INJECT 20 UNITS INTO THE SKIN AT BEDTIME Patient taking differently: 12 Units daily.  02/21/20   Venia Carbon, MD  loratadine (CLARITIN) 10 MG tablet Take 10 mg by mouth 2 (two) times daily.    Tisovec, Fransico Him, MD  lovastatin (MEVACOR) 40 MG tablet Take 2 tablets (80 mg total) by mouth at bedtime. 09/16/19   Venia Carbon, MD  methadone (DOLOPHINE) 10 MG tablet Take 2 tablets (82m total) by mouth every 6 hours as needed (30 day supply) 06/22/20   LVenia Carbon MD  metolazone (ZAROXOLYN) 2.5 MG tablet Take 1 tablet (2.5 mg total) by mouth once a week. Every Tuesday 03/08/20  Bensimhon, Shaune Pascal, MD  metoprolol tartrate (LOPRESSOR) 25 MG tablet Take 1/2 tablet by mouth twice a day 05/09/20   Venia Carbon, MD  mometasone (ASMANEX, 120 METERED DOSES,) 220 MCG/INH inhaler Inhale 2 puffs into the lungs 2 (two) times daily. 12/23/19   Martyn Ehrich, NP  nitroGLYCERIN (NITROSTAT) 0.4 MG SL tablet Place 1 tablet (0.4 mg total) under the tongue every 5 (five) minutes as needed for chest pain. 06/06/20   Wellington Hampshire, MD  pantoprazole (PROTONIX) 40 MG tablet Take 1 tablet by mouth twice a day 06/22/20   Venia Carbon, MD  potassium chloride SA (KLOR-CON) 20 MEQ tablet Take 2 tablets (40 mEq total) by mouth once a week. Every Tuesday 03/08/20   Bensimhon, Shaune Pascal, MD  PROAIR HFA 108 [phone] Base) MCG/ACT inhaler USE 2 PUFFS EVERY 6 HOURS AS NEEDED FOR WHEEZING OR SHORTNESS OF BREATH 05/16/20   Laurin Coder, MD  Respiratory Therapy Supplies (FLUTTER) DEVI Use as directed 12/10/19   Tyler Pita, MD  STIOLTO RESPIMAT 2.5-2.5 MCG/ACT AERS INHALE 2 PUFFS BY MOUTH INTO THE LUNGS DAILY. 05/30/20   Sherrilyn Rist A, MD   torsemide (DEMADEX) 20 MG tablet Take 3 tablets (60 mg total) by mouth daily. 02/15/20   Clegg, Amy D, NP  Treprostinil (TYVASO) 0.6 MG/ML SOLN Inhale 18 mcg into the lungs 4 (four) times daily.    [provider]    Allergies Sertraline hcl  Family History  Problem Relation Age of Onset  . Diabetes Mother   . Cancer Mother        ovarian,melanoma  . Allergies Father   . Cancer Father        lung  . Cancer Maternal Grandmother        uterine  . Emphysema Paternal Grandfather   . Allergies Sister   . Breast cancer Neg Hx     Social History Social History   Tobacco Use  . Smoking status: Former Smoker    Packs/day: 1.50    Years: 33.00    Pack years: 49.50    Types: Cigarettes    Quit date: 07/28/2005    Years since quitting: 14.9  . Smokeless tobacco: Never Used  Vaping Use  . Vaping Use: Never used  Substance Use Topics  . Alcohol use: No    Alcohol/week: 0.0 standard drinks    Comment: heavy in the past  . Drug use: No    Review of Systems Constitutional: No fever/chills Eyes: No visual changes. ENT: No sore throat.  See HPI Cardiovascular: Denies chest pain. Respiratory: Denies shortness of breath. Gastrointestinal: No abdominal pain.   Skin: Negative for rash. Neurological: Negative for headaches.    ____________________________________________   PHYSICAL EXAM:  VITAL SIGNS: ED Triage Vitals  Enc Vitals Group     BP 06/28/20 0652 (!) 160/86     Pulse Rate 06/28/20 0652 88     Resp 06/28/20 0652 20     Temp 06/28/20 0652 98 F (36.7 C)     Temp Source 06/28/20 0652 Oral     SpO2 06/28/20 0652 92 %     Weight 06/28/20 0654 150 lb (68 kg)     Height 06/28/20 0654 5' 1" (1.549 m)     Head Circumference --      Peak Flow --      Pain Score 06/28/20 0654 0     Pain Loc --      Pain Edu? --  Excl. in [place]? --     Constitutional: Alert and oriented. Well appearing and in no acute distress. Eyes: Conjunctivae are normal. Head:  Atraumatic. Nose: No congestion/rhinnorhea. Mouth/Throat: Mucous membranes are somewhat dry and her nasal canals bilaterally.  Right nare demonstrates small adherent clot in the inferior aspect of the anterior region of Hesselbach's plexus.  The left nare is free and clear of any blood or noted abnormality.  Slight irritation of the mucous membranes noted on the right side of the nasal canal.  No active bleeding.  Small amounts of clot are noted in the posterior oropharynx, but no evidence of active bleeding. Neck: No stridor.  Cardiovascular: Normal rate, regular rhythm.  Moderate systolic and slight diastolic ejection murmurs.  Good peripheral circulation. Respiratory: Normal respiratory effort.  No retractions. Lungs CTAB.  Using 4 L nasal cannula without any distress.  No focally abnormal lung sounds. Gastrointestinal: Soft and nontender. No distention. Musculoskeletal: No lower extremity tenderness nor edema. Neurologic:  Normal speech and language. No gross focal neurologic deficits are appreciated.  Skin:  Skin is warm, dry and intact. No rash noted. Psychiatric: Mood and affect are normal. Speech and behavior are normal.  ____________________________________________   LABS (all labs ordered are listed, but only abnormal results are displayed)  Labs Reviewed - No data to display ____________________________________________  EKG   ____________________________________________  RADIOLOGY   ____________________________________________   PROCEDURES  Procedure(s) performed: None  Procedures  Critical Care performed: No  ____________________________________________   INITIAL IMPRESSION / ASSESSMENT AND PLAN / ED COURSE  Pertinent labs & imaging results that were available during my care of the patient were reviewed by me and considered in my medical decision making (see chart for details).   Patient had epistaxis.  Likely originating from the right anterior nare.  No  evidence of ongoing bleeding.  It seems to have subsided with use of pressure.  Discussed with the patient careful return precautions, treatment recommendations including how to use pressure and Afrin to attempt to relieve minor to moderate nosebleed at home.  Recommended the patient follow-up with ear nose and throat for further evaluation.  She is not anticoagulated, blood pressure controlled.  Her oxygen levels are normal given her 4 L, and she has no acute pulmonary complaints.  Lung sounds clear.  Noted cardiac murmur which is known.  ----------------------------------------- 7:39 AM on 06/28/2020 -----------------------------------------  No ongoing epistaxis.  Patient resting comfortably  Return precautions and treatment recommendations and follow-up discussed with the patient who is agreeable with the plan.       ____________________________________________   FINAL CLINICAL IMPRESSION(S) / ED DIAGNOSES  Final diagnoses:  Right-sided epistaxis        Note:  This document was prepared using Dragon voice recognition software and may include unintentional dictation errors       Delman Kitten, MD 06/28/20 [phone]

## 2020-06-28 NOTE — Telephone Encounter (Signed)
Received fax from [place] given to PCP to provide medical records and treatment plans and reviews and consultation notes in order for them to consider covering the methadone script. PCP reports pt should try to get this filled at a local pharmacy.    Contacted pt and advised it would be best to send locally. Pt agreed and would like sent locally. Pt would like it sent to [facility]. Advised a msg would be sent to PCP to resend script to new pharmacy. Pt verbalized understanding.

## 2020-06-28 NOTE — ED Notes (Addendum)
Pt found on 6 L [place] sating at 97%. HR 81 bpm Bleeding seem to have stopped at this time.  Pt provided with water.

## 2020-06-29 ENCOUNTER — Telehealth: Payer: Self-pay

## 2020-06-29 NOTE — Telephone Encounter (Signed)
Left message to see how pt is doing after ER visit for nosebleeds. Dr Silvio Pate said if she does not go to ENT, she will need to F/U here.

## 2020-07-04 ENCOUNTER — Other Ambulatory Visit: Payer: Self-pay | Admitting: Family

## 2020-07-04 ENCOUNTER — Telehealth: Payer: Self-pay | Admitting: Pulmonary Disease

## 2020-07-04 MED ORDER — ASMANEX (120 METERED DOSES) 220 MCG/INH IN AEPB: 2.0000 | INHALATION_SPRAY | Freq: Two times a day (BID) | RESPIRATORY_TRACT | 6 refills | Status: DC

## 2020-07-04 NOTE — Telephone Encounter (Signed)
Spoke with pt and advised that Asmanex refill was sent to pharmacy. Pt verbalized understanding. Nothing further needed.

## 2020-07-05 ENCOUNTER — Other Ambulatory Visit (HOSPITAL_COMMUNITY): Payer: Self-pay | Admitting: Internal Medicine

## 2020-07-06 DIAGNOSIS — J342 Deviated nasal septum: Secondary | ICD-10-CM | POA: Diagnosis not present

## 2020-07-06 DIAGNOSIS — R040 Epistaxis: Secondary | ICD-10-CM | POA: Diagnosis not present

## 2020-07-07 NOTE — Telephone Encounter (Signed)
Spoke to pt. She went to ENT yesterday. Saw Dr Richardson Landry. They did a scope and saw some areas of dryness. She will use gel a few times a day.

## 2020-07-10 ENCOUNTER — Ambulatory Visit: Payer: PPO | Admitting: Podiatry

## 2020-07-20 ENCOUNTER — Telehealth (HOSPITAL_COMMUNITY): Payer: Self-pay | Admitting: *Deleted

## 2020-07-20 NOTE — Telephone Encounter (Signed)
Pt aware and verbalized understanding.

## 2020-07-20 NOTE — Telephone Encounter (Signed)
Pt left VM stating she is retaining fluid and wanted to know if she should make medication changes. Called pt she said for the last 3-4 days her she has been more short of breath, weight is up about 3lbs, swelling in legs. Tomorrow is patients metolazone day but pt said she only loses about 1lb and and still has swelling.   Routed to [place] for advice

## 2020-07-20 NOTE — Telephone Encounter (Signed)
Take an extra 40 mg torsemide tonight + extra 20 meq potassium   Kalis Friese NP-C  4:59 PM

## 2020-07-24 ENCOUNTER — Encounter: Payer: Self-pay | Admitting: Podiatry

## 2020-07-24 ENCOUNTER — Other Ambulatory Visit: Payer: Self-pay

## 2020-07-24 ENCOUNTER — Ambulatory Visit: Payer: PPO | Admitting: Podiatry

## 2020-07-24 DIAGNOSIS — L84 Corns and callosities: Secondary | ICD-10-CM | POA: Diagnosis not present

## 2020-07-24 DIAGNOSIS — E1142 Type 2 diabetes mellitus with diabetic polyneuropathy: Secondary | ICD-10-CM

## 2020-07-24 DIAGNOSIS — N184 Chronic kidney disease, stage 4 (severe): Secondary | ICD-10-CM | POA: Diagnosis not present

## 2020-07-24 DIAGNOSIS — I739 Peripheral vascular disease, unspecified: Secondary | ICD-10-CM

## 2020-07-24 NOTE — Progress Notes (Signed)
This patient returns to my office for at risk foot care.  This patient requires this care by a professional since this patient will be at risk due to having chronic kidney disease and diabetes with vascular complications and neuropathy.  She was seen 4 weeks  and her painful precallus was debrided and padding placed in her shoe.  She presents the office today stating that she is having pain and discomfort but there is no drainage bleeding or infection at the site of the painful callus under the outside ball of her right foot.   This patient presents for at risk foot care today.  General Appearance  Alert, conversant and in no acute stress.  Vascular  Dorsalis pedis and posterior tibial  pulses are not  palpable  bilaterally.  Capillary return is within normal limits  bilaterally. Cold feet  bilaterally.  Neurologic  Senn-Weinstein monofilament wire test diminished/absent  bilaterally. Muscle power within normal limits bilaterally.  Nails Thick disfigured discolored nails with subungual debris  from hallux to fifth toes bilaterally. No evidence of bacterial infection or drainage bilaterally.  Orthopedic  No limitations of motion  feet .  No crepitus or effusions noted.  No bony pathology or digital deformities noted. Plantar flexed fifth metatarsal right foot greater than left foot.  Skin  normotropic skin with no porokeratosis noted bilaterally.  No signs of infections or ulcers noted.  Healing hemorrhagic callus noted sub 5th right.  No drainage or infection.   Pre- callus right foot.  ROV to discuss preulcerous callus right foot.  Debridement of callus under the fifth MPJ of the right foot using a #15 blade.  No evidence of any ulceration infection or drainage noted.  Patient was given additional padding for her to place the padding and different shoes.  She is to return to the office in 4 weeks for continued evaluation and treatment.   RTC 4 weeks for preulcerous lesion.                 Told  patient to return for periodic foot care and evaluation due to potential at risk complications.   Gardiner Barefoot DPM

## 2020-08-02 ENCOUNTER — Ambulatory Visit (INDEPENDENT_AMBULATORY_CARE_PROVIDER_SITE_OTHER): Payer: PPO | Admitting: Internal Medicine

## 2020-08-02 ENCOUNTER — Other Ambulatory Visit: Payer: Self-pay | Admitting: *Deleted

## 2020-08-02 ENCOUNTER — Encounter: Payer: Self-pay | Admitting: Internal Medicine

## 2020-08-02 ENCOUNTER — Other Ambulatory Visit: Payer: Self-pay

## 2020-08-02 VITALS — BP 130/90 | HR 91 | Temp 97.7°F | Ht 61.0 in | Wt 155.0 lb

## 2020-08-02 DIAGNOSIS — M549 Dorsalgia, unspecified: Secondary | ICD-10-CM | POA: Diagnosis not present

## 2020-08-02 DIAGNOSIS — E1149 Type 2 diabetes mellitus with other diabetic neurological complication: Secondary | ICD-10-CM

## 2020-08-02 DIAGNOSIS — F1120 Opioid dependence, uncomplicated: Secondary | ICD-10-CM | POA: Diagnosis not present

## 2020-08-02 DIAGNOSIS — G8929 Other chronic pain: Secondary | ICD-10-CM

## 2020-08-02 DIAGNOSIS — I5032 Chronic diastolic (congestive) heart failure: Secondary | ICD-10-CM

## 2020-08-02 DIAGNOSIS — Z23 Encounter for immunization: Secondary | ICD-10-CM | POA: Diagnosis not present

## 2020-08-02 LAB — POCT GLYCOSYLATED HEMOGLOBIN (HGB A1C): Hemoglobin A1C: 6.5 % — AB (ref 4.0–5.6)

## 2020-08-02 MED ORDER — LANTUS SOLOSTAR 100 UNIT/ML ~~LOC~~ SOPN: 15.0000 [IU] | PEN_INJECTOR | Freq: Every day | SUBCUTANEOUS | 0 refills | Status: AC

## 2020-08-02 NOTE — Assessment & Plan Note (Signed)
About the same Doing well with the methadone

## 2020-08-02 NOTE — Assessment & Plan Note (Signed)
Weight is up some Will ask Dr Sung Amabile to decide how to adjust diuretics ?increase metolazone vs torsemide

## 2020-08-02 NOTE — Telephone Encounter (Signed)
Patient left a voicemail requesting a refill Name of Medication: Methadone Name of Pharmacy: [facility] or Written Date and Quantity: 06/28/20 #240/0 Last Office Visit and Type: 08/02/20 Next Office Visit and Type: 11/07/20 Last Controlled Substance Agreement Date: 10/19/19 Last UDS:09/27/19

## 2020-08-02 NOTE — Assessment & Plan Note (Signed)
Lab Results  Component Value Date   HGBA1C 6.5 (A) 08/02/2020   Decreased lantus to 15 daily due to low sugars She can decrease further if consistently under 120 No other meds

## 2020-08-02 NOTE — Progress Notes (Signed)
Subjective:    Patient ID: Lindsay Harmon, female    DOB: [birth year], 64 y.o.   MRN: [id number]  HPI Here for follow up of chronic pain and diabetes This visit occurred during the SARS-CoV-2 public health emergency.  Safety protocols were in place, including screening questions prior to the visit, additional usage of staff PPE, and extensive cleaning of exam room while observing appropriate contact time as indicated for disinfecting solutions.   Breathing may be slightly worse Has been retaining some more fluid---discussed with the pulmonary doctor Did try extra torsemide one day (may need that more) Takes the metalozone once a week--may need to increase that  Chronic pain is stable Mostly back Methadone usually three times a day---rarely needs it 4 times Still helpful  Sugars have been good Using 15 units of lantus daily No other Rx Did have brief low sugar reactions---better now that she cut it to 15  Current Outpatient Medications on File Prior to Visit  Medication Sig Dispense Refill  . albuterol (PROVENTIL) (2.5 MG/3ML) 0.083% nebulizer solution USE 1 VIAL (3ML) BY NEBULIZATION EVERY 6HOURS AS NEEDED FOR WHEEZING OR SHORTNESS OF BREATH 360 mL 0  . ALPRAZolam (XANAX) 0.25 MG tablet Take 1 tablet (0.25 mg total) by mouth daily as needed for anxiety. 30 tablet 0  . aspirin EC 81 MG tablet Take 1 tablet (81 mg total) by mouth daily. 90 tablet 3  . calcitRIOL (ROCALTROL) 0.5 MCG capsule Take 0.5 mcg by mouth daily.     . cholecalciferol (VITAMIN D) 1000 units tablet Take 1,000 Units by mouth at bedtime.     . ferrous sulfate 325 (65 FE) MG tablet Take 325 mg by mouth daily with breakfast.    . gabapentin (NEURONTIN) 300 MG capsule TAKE 1 CAPSULE BY MOUTH IN THE MORNING AND 2 CAPSULES BY MOUTH AT BEDTIME 270 capsule 3  . glucose blood (ONE TOUCH ULTRA TEST) test strip USE 1 STRIP TO TEST BLOOD SUGAR ONCE DAILY Dx Code E11.51 100 each 3  . Insulin Pen Needle (B-D UF III MINI PEN  NEEDLES) 31G X 5 MM MISC USE 1 PEN NEEDLE TO INJECT INSULIN 100 each 5  . LANTUS SOLOSTAR 100 UNIT/ML Solostar Pen INJECT 20 UNITS INTO THE SKIN AT BEDTIME (Patient taking differently: 15 Units daily. ) 15 mL 5  . loratadine (CLARITIN) 10 MG tablet Take 10 mg by mouth 2 (two) times daily.    Marland Kitchen lovastatin (MEVACOR) 40 MG tablet Take 2 tablets (80 mg total) by mouth at bedtime. 180 tablet 3  . methadone (DOLOPHINE) 10 MG tablet Take 2 tablets (55m total) by mouth every 6 hours as needed (30 day supply) 240 tablet 0  . metolazone (ZAROXOLYN) 2.5 MG tablet TAKE 1 TABLET BY MOUTH ONCE A WEEK EVERYTUESDAY 5 tablet 3  . metoprolol tartrate (LOPRESSOR) 25 MG tablet Take 1/2 tablet by mouth twice a day 90 tablet 3  . mometasone (ASMANEX, 120 METERED DOSES,) 220 MCG/INH inhaler Inhale 2 puffs into the lungs 2 (two) times daily. 1 each 6  . nitroGLYCERIN (NITROSTAT) 0.4 MG SL tablet Place 1 tablet (0.4 mg total) under the tongue every 5 (five) minutes as needed for chest pain. 90 tablet 1  . pantoprazole (PROTONIX) 40 MG tablet Take 1 tablet by mouth twice a day 180 tablet 3  . potassium chloride SA (KLOR-CON) 20 MEQ tablet Take 2 tablets (40 mEq total) by mouth once a week. Every Tuesday 90 tablet 3  . PROAIR HFA 108 (  90 Base) MCG/ACT inhaler USE 2 PUFFS EVERY 6 HOURS AS NEEDED FOR WHEEZING OR SHORTNESS OF BREATH 8.5 g 3  . Respiratory Therapy Supplies (FLUTTER) DEVI Use as directed 1 each 0  . STIOLTO RESPIMAT 2.5-2.5 MCG/ACT AERS INHALE 2 PUFFS BY MOUTH INTO THE LUNGS DAILY. 4 g 3  . torsemide (DEMADEX) 20 MG tablet Take 3 tablets (60 mg total) by mouth daily. 270 tablet 1  . Treprostinil (TYVASO) 0.6 MG/ML SOLN Inhale 18 mcg into the lungs 4 (four) times daily.     No current facility-administered medications on file prior to visit.    Allergies  Allergen Reactions  . Sertraline Hcl Other (See Comments)    Altered Mental Status    Past Medical History:  Diagnosis Date  . Allergy   . Aortic  atherosclerosis ([place])   . Arthritis   . CKD (chronic kidney disease), stage IV ([place])   . COPD (chronic obstructive pulmonary disease) ([place])    a. 11/2014 PFT's: FEV1 0.87 (38%), FVC 1.41 (48%), FEF 25-75 0.41 (19%). Unable to perform DLCO; b. 12/2017 Simple spirometry ration 67%. FEV1 1.21 L+53% predicted. FVC 1.79L 61% predicted.  . Coronary artery calcification seen on CT scan    a. 03/2017 CT Chest.  . Depression   . Diabetic neuropathy ([place])   . Erythrocytosis 12/13/2019  . Esophageal stricture   . Familial hematuria   . GERD (gastroesophageal reflux disease)   . Hyperlipidemia   . Hypertension   . IBS (irritable bowel syndrome)   . Nephrolithiasis   . NIDDM (non-insulin dependent diabetes mellitus)   . Obesity, unspecified   . Pulmonary hypertension ([place])    a. WHO Group 3; b. 10/2016 Echo: EF 55-60%, no rwma, Gr1 DD, mild to mod AS, mean grad 63mHg, mildly dil RV w/ mildly reduced RV fxn, mildly dil RA. PASP 1130mg; c. 12/2017 RHC: RA 11, RV 92/12, PA 92/33(57), PCWP 15, Fick CO/CI 5.9/3.3. PVR 7.2 WU. Ao 93%, PA 62/64%.  . Marland KitchenVD (peripheral vascular disease) ([place]    Past Surgical History:  Procedure Laterality Date  . ABDOMINAL HYSTERECTOMY    . CARDIAC CATHETERIZATION    . CARPAL TUNNEL RELEASE    . CESAREAN SECTION    . ERCP N/A 03/17/2017   Procedure: ENDOSCOPIC RETROGRADE CHOLANGIOPANCREATOGRAPHY (ERCP);  Surgeon: WoLucilla LameMD;  [facility];  Service: Endoscopy;  Laterality: N/A;  . EUS N/A 03/13/2017   Procedure: FULL UPPER ENDOSCOPIC ULTRASOUND (EUS) RADIAL;  Surgeon: Burbridge, ReMurray HodgkinsMD;  Location: [facility] ENDOSCOPY;  Service: Endoscopy;  Laterality: N/A;  . RIGHT HEART CATH N/A 01/22/2018   Procedure: RIGHT HEART CATH;  Surgeon: BeJolaine ArtistMD;  [facility];  Service: Cardiovascular;  Laterality: N/A;  . RIGHT HEART CATHETERIZATION N/A 12/05/2014   Procedure: RIGHT HEART CATH;  Surgeon: HeSinclair GroomsMD;  [facility];  Service: Cardiovascular;  Laterality: N/A;    Family History  Problem Relation Age of Onset  . Diabetes Mother   . Cancer Mother        ovarian,melanoma  . Allergies Father   . Cancer Father        lung  . Cancer Maternal Grandmother        uterine  . Emphysema Paternal Grandfather   . Allergies Sister   . Breast cancer Neg Hx     Social History   Socioeconomic History  . Marital status: Legally Separated    Spouse name: Not on  file  . Number of children: 1  . Years of education: Not on file  . Highest education level: Not on file  Occupational History  . 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 Smoking status: Former Smoker    Packs/day: 1.50    Years: 33.00    Pack years: 49.50    Types: Cigarettes    Quit date: 07/28/2005    Years since quitting: 15.0  . Smokeless tobacco: Never Used  Vaping Use  . Vaping Use: Never used  Substance and Sexual Activity  . Alcohol use: No    Alcohol/week: 0.0 standard drinks    Comment: heavy in the past  . Drug use: No  . Sexual activity: Never  Other Topics Concern  . Not on file  Social History Narrative   No living will   Son [name] (then mom) should make decisions for her if she is unable.    Would accept resuscitation attempts but no prolonged ventilation   Not sure about tube feeds but probably wouldn't want them if cognitively unaware   Social Determinants of Health   Financial Resource Strain: Low Risk   . Difficulty of Paying Living Expenses: Not very hard  Food Insecurity:   . Worried About Charity fundraiser in the Last Year: Not on file  . Ran Out of Food in the Last Year: Not on file  Transportation Needs:   . Lack of Transportation (Medical): Not on file  . Lack of Transportation (Non-Medical): Not on file  Physical Activity:   . Days of Exercise per Week: Not on file  . Minutes of Exercise per Session: Not on file  Stress:   . Feeling of Stress :  Not on file  Social Connections:   . Frequency of Communication with Friends and Family: Not on file  . Frequency of Social Gatherings with Friends and Family: Not on file  . Attends Religious Services: Not on file  . Active Member of Clubs or Organizations: Not on file  . Attends Archivist Meetings: Not on file  . Marital Status: Not on file  Intimate Partner Violence:   . Fear of Current or Ex-Partner: Not on file  . Emotionally Abused: Not on file  . Physically Abused: Not on file  . Sexually Abused: Not on file   Review of Systems Weight is up 5# Sleeps okay    Objective:   Physical Exam Constitutional:      Appearance: Normal appearance.  Cardiovascular:     Rate and Rhythm: Normal rate and regular rhythm.     Heart sounds: No gallop.      Comments: Coarse systolic murmur Pulmonary:     Effort: Pulmonary effort is normal.     Breath sounds: No wheezing or rales.     Comments: Some basilar rhonchi Musculoskeletal:     Cervical back: Neck supple.     Right lower leg: No edema.     Left lower leg: No edema.  Lymphadenopathy:     Cervical: No cervical adenopathy.  Neurological:     Mental Status: She is alert.  Psychiatric:        Mood and Affect: Mood normal.        Behavior: Behavior normal.            Assessment & Plan:

## 2020-08-02 NOTE — Assessment & Plan Note (Signed)
PDMP reviewed No concerns 

## 2020-08-02 NOTE — Progress Notes (Signed)
Hey Rich.  We will reach out to her. Thanks -dan

## 2020-08-03 ENCOUNTER — Telehealth (HOSPITAL_COMMUNITY): Payer: Self-pay | Admitting: *Deleted

## 2020-08-03 MED ORDER — METHADONE HCL 10 MG PO TABS: ORAL_TABLET | ORAL | 0 refills | Status: DC

## 2020-08-03 NOTE — Telephone Encounter (Signed)
-----  Message from Jolaine Artist, MD sent at 08/02/2020  8:47 PM EDT ----- Lindsay Harmon - can you call her and check in on her to see how her fluid is. If she is struggling we can ask her to take an extra metolazone. Thanks

## 2020-08-03 NOTE — Progress Notes (Signed)
Left vm requesting pt return call

## 2020-08-03 NOTE — Telephone Encounter (Signed)
Attempted to reach pt phone. No answer/left vm requesting return call.     Bensimhon, Shaune Pascal, MD  Harvie Junior, CMA Hey - can you call her and check in on her to see how her fluid is. If she is struggling we can ask her to take an extra metolazone. Thanks

## 2020-08-04 ENCOUNTER — Ambulatory Visit: Payer: PPO | Admitting: Internal Medicine

## 2020-08-10 ENCOUNTER — Telehealth: Payer: Self-pay

## 2020-08-10 MED ORDER — GLUCOSE BLOOD VI STRP: ORAL_STRIP | 3 refills | Status: AC

## 2020-08-10 NOTE — Addendum Note (Signed)
Addended by: Pilar Grammes on: 08/10/2020 03:48 PM   Modules accepted: Orders

## 2020-08-10 NOTE — Telephone Encounter (Signed)
Spoke to pt. I sent the rx to [place] in July. She has that rx. Dr Silvio Pate wants her checking more than once a day due to dropping numbers. I advised her that usually Medicare only covers 1 strip a day if she is not on regular insulin. Usually they do not consider Lantus a regular insulin. I went ahead and sent in a new rx to mail order to see if it would work.

## 2020-08-10 NOTE — Telephone Encounter (Signed)
Received vm from pt stating Dr. Silvio Pate was going to have [facility] send refill for her test strips.  Pt states pharmacy has not received them yet.  Looks like rx was sent to [facility] on 05/16/20, #100/3.

## 2020-08-21 ENCOUNTER — Other Ambulatory Visit (HOSPITAL_COMMUNITY): Payer: Self-pay | Admitting: Adult Health

## 2020-08-24 ENCOUNTER — Encounter: Payer: Self-pay | Admitting: Podiatry

## 2020-08-24 ENCOUNTER — Ambulatory Visit: Payer: PPO | Admitting: Podiatry

## 2020-08-24 ENCOUNTER — Other Ambulatory Visit: Payer: Self-pay

## 2020-08-24 DIAGNOSIS — N184 Chronic kidney disease, stage 4 (severe): Secondary | ICD-10-CM | POA: Diagnosis not present

## 2020-08-24 DIAGNOSIS — L84 Corns and callosities: Secondary | ICD-10-CM | POA: Diagnosis not present

## 2020-08-24 DIAGNOSIS — I739 Peripheral vascular disease, unspecified: Secondary | ICD-10-CM

## 2020-08-24 NOTE — Progress Notes (Signed)
This patient returns to my office for at risk foot care.  This patient requires this care by a professional since this patient will be at risk due to having chronic kidney disease and diabetes with vascular complications and neuropathy.  She was seen 4 weeks  and her painful precallus was debrided and padding placed in her shoe.  She presents the office today stating that she is having pain and discomfort but there is no drainage bleeding or infection at the site of the painful callus under the outside ball of her right foot.   This patient presents for at risk foot care today.  General Appearance  Alert, conversant and in no acute stress.  Vascular  Dorsalis pedis and posterior tibial  pulses are not  palpable  bilaterally.  Capillary return is within normal limits  bilaterally. Cold feet  bilaterally.  Neurologic  Senn-Weinstein monofilament wire test diminished/absent  bilaterally. Muscle power within normal limits bilaterally.  Nails Thick disfigured discolored nails with subungual debris  from hallux to fifth toes bilaterally. No evidence of bacterial infection or drainage bilaterally.  Orthopedic  No limitations of motion  feet .  No crepitus or effusions noted.  No bony pathology or digital deformities noted. Plantar flexed fifth metatarsal right foot greater than left foot.  Skin  normotropic skin with no porokeratosis noted bilaterally.  No signs of infections or ulcers noted.  Healing hemorrhagic callus noted sub 5th right.  No drainage or infection.   Pre- callus right foot.  ROV to discuss preulcerous callus right foot.  Debridement of callus under the fifth MPJ of the right foot using a #15 blade.  No evidence of any ulceration infection or drainage noted.  Patient was given additional padding for her to place the padding and different shoes.  She is to return to the office in 4 weeks for continued evaluation and treatment.   RTC 4 weeks for preulcerous lesion.                 Told  patient to return for periodic foot care and evaluation due to potential at risk complications.   Gardiner Barefoot DPM

## 2020-08-28 DIAGNOSIS — E119 Type 2 diabetes mellitus without complications: Secondary | ICD-10-CM | POA: Diagnosis not present

## 2020-08-28 LAB — HM DIABETES EYE EXAM

## 2020-08-31 ENCOUNTER — Telehealth: Payer: Self-pay | Admitting: Primary Care

## 2020-08-31 NOTE — Telephone Encounter (Signed)
Spoke with pharm- they got the medication in stock and nothing needed at this time.

## 2020-09-12 ENCOUNTER — Other Ambulatory Visit: Payer: Self-pay | Admitting: Internal Medicine

## 2020-09-12 NOTE — Telephone Encounter (Signed)
Patient left a voicemail requesting a refill on Methadone  Name of Medication: Methadone Name of Pharmacy: [facility] Date and Quantity: 08/03/20 #240 Last Office Visit and Type: 08/02/20 Next Office Visit and Type: 11/07/20 Last Controlled Substance Agreement Date: 10/19/19 Last UDS:09/27/19

## 2020-09-25 ENCOUNTER — Ambulatory Visit: Payer: PPO | Admitting: Podiatry

## 2020-09-25 ENCOUNTER — Encounter: Payer: Self-pay | Admitting: Podiatry

## 2020-09-25 ENCOUNTER — Other Ambulatory Visit: Payer: Self-pay

## 2020-09-25 DIAGNOSIS — IMO0002 Reserved for concepts with insufficient information to code with codable children: Secondary | ICD-10-CM

## 2020-09-25 DIAGNOSIS — L84 Corns and callosities: Secondary | ICD-10-CM

## 2020-09-25 DIAGNOSIS — N184 Chronic kidney disease, stage 4 (severe): Secondary | ICD-10-CM | POA: Diagnosis not present

## 2020-09-25 DIAGNOSIS — I739 Peripheral vascular disease, unspecified: Secondary | ICD-10-CM

## 2020-09-25 DIAGNOSIS — E1151 Type 2 diabetes mellitus with diabetic peripheral angiopathy without gangrene: Secondary | ICD-10-CM | POA: Diagnosis not present

## 2020-09-25 DIAGNOSIS — E1165 Type 2 diabetes mellitus with hyperglycemia: Secondary | ICD-10-CM

## 2020-09-25 DIAGNOSIS — M79674 Pain in right toe(s): Secondary | ICD-10-CM

## 2020-09-25 NOTE — Progress Notes (Signed)
This patient returns to my office for at risk foot care.  This patient requires this care by a professional since this patient will be at risk due to having chronic kidney disease and diabetes with vascular complications and neuropathy.  She was seen 4 weeks  and her painful precallus was debrided and padding placed in her shoe.  She presents the office today stating that she is having  No pain and discomfort and there is no drainage bleeding or infection at the site of the painful callus under the outside ball of her right foot.   This patient presents for at risk foot care today.  General Appearance  Alert, conversant and in no acute stress.  Vascular  Dorsalis pedis and posterior tibial  pulses are not  palpable  bilaterally.  Capillary return is within normal limits  bilaterally. Cold feet  bilaterally.  Neurologic  Senn-Weinstein monofilament wire test diminished/absent  bilaterally. Muscle power within normal limits bilaterally.  Nails Thick disfigured discolored nails with subungual debris  from hallux to fifth toes bilaterally. No evidence of bacterial infection or drainage bilaterally.  Orthopedic  No limitations of motion  feet .  No crepitus or effusions noted.  No bony pathology or digital deformities noted. Plantar flexed fifth metatarsal right foot greater than left foot.  Skin  normotropic skin with no porokeratosis noted bilaterally.  No signs of infections or ulcers noted.  Healing hemorrhagic callus noted sub 5th right.  No drainage or infection.   Pre- callus right foot.  Onychomycosis  B/L.  ROV   Debridement of callus under the fifth MPJ of the right foot using a dremel tool.  No evidence of any ulceration infection or drainage noted.  Patient was given additional padding for her to place the padding and different shoes.  She is to return to the office in 6 weeks for continued evaluation and treatment.  Debridement of nails with nail nipper and dremel tool.   RTC 6 weeks for  preulcerous lesion.                 Told patient to return for periodic foot care and evaluation due to potential at risk complications.   Gardiner Barefoot DPM

## 2020-10-02 ENCOUNTER — Other Ambulatory Visit: Payer: Self-pay | Admitting: Pulmonary Disease

## 2020-10-10 ENCOUNTER — Ambulatory Visit (INDEPENDENT_AMBULATORY_CARE_PROVIDER_SITE_OTHER): Payer: PPO | Admitting: Internal Medicine

## 2020-10-10 ENCOUNTER — Other Ambulatory Visit: Payer: Self-pay

## 2020-10-10 ENCOUNTER — Encounter: Payer: Self-pay | Admitting: Internal Medicine

## 2020-10-10 VITALS — BP 124/78 | HR 72 | Temp 97.5°F | Ht 61.0 in | Wt 158.0 lb

## 2020-10-10 DIAGNOSIS — I5033 Acute on chronic diastolic (congestive) heart failure: Secondary | ICD-10-CM | POA: Insufficient documentation

## 2020-10-10 MED ORDER — TORSEMIDE 20 MG PO TABS: 60.0000 mg | ORAL_TABLET | Freq: Two times a day (BID) | ORAL | 0 refills | Status: AC

## 2020-10-10 NOTE — Patient Instructions (Signed)
Please take a second dose of 78m torsemide (3-4 hours after the first) on any day your home weight is 150# or higher.

## 2020-10-10 NOTE — Progress Notes (Signed)
Subjective:    Patient ID: Lindsay Harmon, female    DOB: Mar 24, 1956, 64 y.o.   MRN: [id number]  HPI Here due to weeping in her legs This visit occurred during the SARS-CoV-2 public health emergency.  Safety protocols were in place, including screening questions prior to the visit, additional usage of staff PPE, and extensive cleaning of exam room while observing appropriate contact time as indicated for disinfecting solutions.   Mild weeping from both legs for about a week Only intermittently Not severe  Continues torsemide 34m daily Checks weight daily  148-159# at home in past few months  Has had some spells of SOB Usually with activity No chest pain Functional level has declined slightly  Current Outpatient Medications on File Prior to Visit  Medication Sig Dispense Refill  . albuterol (PROVENTIL) (2.5 MG/3ML) 0.083% nebulizer solution USE 1 VIAL (3ML) BY NEBULIZATION EVERY 6HOURS AS NEEDED FOR WHEEZING OR SHORTNESS OF BREATH 360 mL 0  . ALPRAZolam (XANAX) 0.25 MG tablet Take 1 tablet (0.25 mg total) by mouth daily as needed for anxiety. 30 tablet 0  . aspirin EC 81 MG tablet Take 1 tablet (81 mg total) by mouth daily. 90 tablet 3  . calcitRIOL (ROCALTROL) 0.5 MCG capsule Take 0.5 mcg by mouth daily.     . cholecalciferol (VITAMIN D) 1000 units tablet Take 1,000 Units by mouth at bedtime.     . ferrous sulfate 325 (65 FE) MG tablet Take 1 tablet by mouth daily.    .Marland Kitchengabapentin (NEURONTIN) 300 MG capsule TAKE 1 CAPSULE BY MOUTH IN THE MORNING AND 2 CAPSULES BY MOUTH AT BEDTIME 270 capsule 3  . glucose blood (ONE TOUCH ULTRA TEST) test strip USE 1 STRIP TO TEST BLOOD SUGAR TWICE DAILY DUE TO LOW BLOOD SUGAR. TAKES LANTUS INSULIN Dx Code E11.51 200 each 3  . insulin glargine (LANTUS SOLOSTAR) 100 UNIT/ML Solostar Pen Inject 15 Units into the skin daily. 1 mL 0  . Insulin Pen Needle (B-D UF III MINI PEN NEEDLES) 31G X 5 MM MISC USE 1 PEN NEEDLE TO INJECT INSULIN 100 each 5  .  loratadine (CLARITIN) 10 MG tablet Take 10 mg by mouth 2 (two) times daily.    .Marland Kitchenlovastatin (MEVACOR) 40 MG tablet Take 2 tablets (80 mg total) by mouth at bedtime. 180 tablet 3  . methadone (DOLOPHINE) 10 MG tablet TAKE 2 TABLETS BY MOUTH EVERY 6 HOURS ASNEEDED 240 tablet 0  . metolazone (ZAROXOLYN) 2.5 MG tablet TAKE 1 TABLET BY MOUTH ONCE A WEEK EVERYTUESDAY 5 tablet 3  . metoprolol tartrate (LOPRESSOR) 25 MG tablet Take 1/2 tablet by mouth twice a day 90 tablet 3  . mometasone (ASMANEX, 120 METERED DOSES,) 220 MCG/INH inhaler Inhale 2 puffs into the lungs 2 (two) times daily. 1 each 6  . nitroGLYCERIN (NITROSTAT) 0.4 MG SL tablet Place 1 tablet (0.4 mg total) under the tongue every 5 (five) minutes as needed for chest pain. 90 tablet 1  . pantoprazole (PROTONIX) 40 MG tablet Take 1 tablet by mouth twice a day 180 tablet 3  . potassium chloride SA (KLOR-CON) 20 MEQ tablet Take 2 tablets (40 mEq total) by mouth once a week. Every Tuesday 90 tablet 3  . PROAIR HFA 108 (90 Base) MCG/ACT inhaler USE 2 PUFFS EVERY 6 HOURS AS NEEDED FOR WHEEZING OR SHORTNESS OF BREATH 8.5 g 3  . Respiratory Therapy Supplies (FLUTTER) DEVI Use as directed 1 each 0  . STIOLTO RESPIMAT 2.5-2.5 MCG/ACT AERS INHALE  2 PUFFS BY MOUTH INTO THE LUNGS DAILY 4 g 3  . torsemide (DEMADEX) 20 MG tablet Take 3 tablets by mouth daily 270 tablet 0  . Treprostinil (TYVASO) 0.6 MG/ML SOLN Inhale 18 mcg into the lungs 4 (four) times daily.     No current facility-administered medications on file prior to visit.    Allergies  Allergen Reactions  . Sertraline Hcl Other (See Comments)    Altered Mental Status    Past Medical History:  Diagnosis Date  . Allergy   . Aortic atherosclerosis ([place])   . Arthritis   . CKD (chronic kidney disease), stage IV ([place])   . COPD (chronic obstructive pulmonary disease) ([place])    a. 11/2014 PFT's: FEV1 0.87 (38%), FVC 1.41 (48%), FEF 25-75 0.41 (19%). Unable to perform DLCO; b. 12/2017 Simple  spirometry ration 67%. FEV1 1.21 L+53% predicted. FVC 1.79L 61% predicted.  . Coronary artery calcification seen on CT scan    a. 03/2017 CT Chest.  . Depression   . Diabetic neuropathy ([place])   . Erythrocytosis 12/13/2019  . Esophageal stricture   . Familial hematuria   . GERD (gastroesophageal reflux disease)   . Hyperlipidemia   . Hypertension   . IBS (irritable bowel syndrome)   . Nephrolithiasis   . NIDDM (non-insulin dependent diabetes mellitus)   . Obesity, unspecified   . Pulmonary hypertension ([place])    a. WHO Group 3; b. 10/2016 Echo: EF 55-60%, no rwma, Gr1 DD, mild to mod AS, mean grad 75mHg, mildly dil RV w/ mildly reduced RV fxn, mildly dil RA. PASP 1144mg; c. 12/2017 RHC: RA 11, RV 92/12, PA 92/33(57), PCWP 15, Fick CO/CI 5.9/3.3. PVR 7.2 WU. Ao 93%, PA 62/64%.  . Marland KitchenVD (peripheral vascular disease) ([place]    Past Surgical History:  Procedure Laterality Date  . ABDOMINAL HYSTERECTOMY    . CARDIAC CATHETERIZATION    . CARPAL TUNNEL RELEASE    . CESAREAN SECTION    . ERCP N/A 03/17/2017   Procedure: ENDOSCOPIC RETROGRADE CHOLANGIOPANCREATOGRAPHY (ERCP);  Surgeon: WoLucilla LameMD;  [facility];  Service: Endoscopy;  Laterality: N/A;  . EUS N/A 03/13/2017   Procedure: FULL UPPER ENDOSCOPIC ULTRASOUND (EUS) RADIAL;  Surgeon: Burbridge, ReMurray HodgkinsMD;  Location: [facility] ENDOSCOPY;  Service: Endoscopy;  Laterality: N/A;  . RIGHT HEART CATH N/A 01/22/2018   Procedure: RIGHT HEART CATH;  Surgeon: BeJolaine ArtistMD;  [facility];  Service: Cardiovascular;  Laterality: N/A;  . RIGHT HEART CATHETERIZATION N/A 12/05/2014   Procedure: RIGHT HEART CATH;  Surgeon: HeSinclair GroomsMD;  [facility];  Service: Cardiovascular;  Laterality: N/A;    Family History  Problem Relation Age of Onset  . Diabetes Mother   . Cancer Mother        ovarian,melanoma  . Allergies Father   . Cancer Father        lung  . Cancer Maternal Grandmother         uterine  . Emphysema Paternal Grandfather   . Allergies Sister   . Breast cancer Neg Hx     Social History   Socioeconomic History  . Marital status: Legally Separated    Spouse name: Not on file  . Number of children: 1  . Years of education: Not on file  . Highest education level: Not on file  Occupational History  . 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  Smoking status: Former Smoker    Packs/day: 1.50    Years: 33.00    Pack years: 49.50    Types: Cigarettes    Quit date: 07/28/2005    Years since quitting: 15.2  . Smokeless tobacco: Never Used  Vaping Use  . Vaping Use: Never used  Substance and Sexual Activity  . Alcohol use: No    Alcohol/week: 0.0 standard drinks    Comment: heavy in the past  . Drug use: No  . Sexual activity: Never  Other Topics Concern  . Not on file  Social History Narrative   No living will   Son [name] (then mom) should make decisions for her if she is unable.    Would accept resuscitation attempts but no prolonged ventilation   Not sure about tube feeds but probably wouldn't want them if cognitively unaware   Social Determinants of Health   Financial Resource Strain: Low Risk   . Difficulty of Paying Living Expenses: Not very hard  Food Insecurity: Not on file  Transportation Needs: Not on file  Physical Activity: Not on file  Stress: Not on file  Social Connections: Not on file  Intimate Partner Violence: Not on file   Review of Systems  Appetite is okay Oxygen levels are stable Sleeping on couch--with head elevated. No PND     Objective:   Physical Exam Constitutional:      Appearance: Normal appearance.  Cardiovascular:     Rate and Rhythm: Normal rate and regular rhythm.     Heart sounds: No gallop.      Comments: Gr 2/6 coarse systolic murmur along LSB mostly. ?soft diastolic murmur Pulmonary:     Effort: Pulmonary effort is normal.     Comments: Slight exp  wheeze Musculoskeletal:     Comments: 1-2+ tense edema mostly in upper calves Mild stasis changes and slight ulceration along lateral ankles  Neurological:     Mental Status: She is alert.            Assessment & Plan:

## 2020-10-10 NOTE — Assessment & Plan Note (Signed)
Some pulmonary decompensation and weeping from legs Her dry weight appears to be under 150# at home Will have her take second dose of the torsemide (48m) when her weight is up

## 2020-10-24 ENCOUNTER — Other Ambulatory Visit: Payer: PPO

## 2020-10-31 ENCOUNTER — Other Ambulatory Visit: Payer: Self-pay

## 2020-10-31 NOTE — Telephone Encounter (Signed)
Name of Medication: methadone 10 mg for  Back pain Name of Pharmacy: [facility] or [facility] Date and Quantity: # 240 on 09/12/2020 Last Office Visit and Type:10/10/20 acute visit; 08/02/20 for 3 mth FU Next Office Visit and Type: 11/07/20 for 3 mth FU Last Controlled Substance Agreement Date: 10/19/19 Last UDS:09/27/19

## 2020-11-01 MED ORDER — METHADONE HCL 10 MG PO TABS: ORAL_TABLET | ORAL | 0 refills | Status: AC

## 2020-11-06 ENCOUNTER — Encounter: Payer: Self-pay | Admitting: Podiatry

## 2020-11-06 ENCOUNTER — Other Ambulatory Visit: Payer: Self-pay

## 2020-11-06 ENCOUNTER — Ambulatory Visit: Payer: PPO | Admitting: Podiatry

## 2020-11-06 DIAGNOSIS — I739 Peripheral vascular disease, unspecified: Secondary | ICD-10-CM

## 2020-11-06 DIAGNOSIS — N184 Chronic kidney disease, stage 4 (severe): Secondary | ICD-10-CM | POA: Diagnosis not present

## 2020-11-06 DIAGNOSIS — L84 Corns and callosities: Secondary | ICD-10-CM | POA: Diagnosis not present

## 2020-11-06 DIAGNOSIS — IMO0002 Reserved for concepts with insufficient information to code with codable children: Secondary | ICD-10-CM

## 2020-11-06 DIAGNOSIS — E1165 Type 2 diabetes mellitus with hyperglycemia: Secondary | ICD-10-CM

## 2020-11-06 DIAGNOSIS — E1151 Type 2 diabetes mellitus with diabetic peripheral angiopathy without gangrene: Secondary | ICD-10-CM | POA: Diagnosis not present

## 2020-11-06 NOTE — Progress Notes (Signed)
This patient returns to my office for at risk foot care.  This patient requires this care by a professional since this patient will be at risk due to having chronic kidney disease and diabetes with vascular complications and neuropathy.  She was seen 6 weeks  and her painful precallus was debrided and padding .  She presents the office today stating that she is having no pain or discomfort.  there is no drainage bleeding or infection at the site of the painful callus under the outside ball of her right foot.   This patient presents for at risk foot care today.  General Appearance  Alert, conversant and in no acute stress.  Vascular  Dorsalis pedis and posterior tibial  pulses are not  palpable  bilaterally.  Capillary return is within normal limits  bilaterally. Cold feet  bilaterally.  Neurologic  Senn-Weinstein monofilament wire test diminished/absent  bilaterally. Muscle power within normal limits bilaterally.  Nails Thick disfigured discolored nails with subungual debris  from hallux to fifth toes bilaterally. No evidence of bacterial infection or drainage bilaterally.  Orthopedic  No limitations of motion  feet .  No crepitus or effusions noted.  No bony pathology or digital deformities noted. Plantar flexed fifth metatarsal right foot greater than left foot.  Skin Thin shiny skin  noted bilaterally.  No signs of infections or ulcers noted.  Healing hemorrhagic callus noted sub 5th right. Porokeratosis sub 5th met left foot. No drainage or infection.   Pre- callus right foot.  Porokeratosis sub 5th left foot.  ROV to discuss preulcerous callus right foot.  Debridement of callus under the fifth MPJ of the right foot using a #15 blade.  No evidence of any ulceration infection or drainage noted.  Patient was given additional padding for her to place the padding and different shoes.  She is to return to the office in 6 weeks for continued evaluation and treatment.   RTC 6 weeks for preulcerous  lesion.                 Told patient to return for periodic foot care and evaluation due to potential at risk complications.   Gardiner Barefoot DPM

## 2020-11-07 ENCOUNTER — Encounter: Payer: Self-pay | Admitting: Internal Medicine

## 2020-11-07 ENCOUNTER — Ambulatory Visit (INDEPENDENT_AMBULATORY_CARE_PROVIDER_SITE_OTHER): Payer: PPO | Admitting: Internal Medicine

## 2020-11-07 VITALS — BP 116/72 | HR 77 | Temp 97.6°F | Ht 61.0 in | Wt 153.8 lb

## 2020-11-07 DIAGNOSIS — G8929 Other chronic pain: Secondary | ICD-10-CM

## 2020-11-07 DIAGNOSIS — M549 Dorsalgia, unspecified: Secondary | ICD-10-CM

## 2020-11-07 DIAGNOSIS — I5032 Chronic diastolic (congestive) heart failure: Secondary | ICD-10-CM

## 2020-11-07 DIAGNOSIS — F1120 Opioid dependence, uncomplicated: Secondary | ICD-10-CM | POA: Diagnosis not present

## 2020-11-07 DIAGNOSIS — E1149 Type 2 diabetes mellitus with other diabetic neurological complication: Secondary | ICD-10-CM

## 2020-11-07 DIAGNOSIS — J9611 Chronic respiratory failure with hypoxia: Secondary | ICD-10-CM | POA: Diagnosis not present

## 2020-11-07 DIAGNOSIS — J449 Chronic obstructive pulmonary disease, unspecified: Secondary | ICD-10-CM

## 2020-11-07 DIAGNOSIS — I272 Pulmonary hypertension, unspecified: Secondary | ICD-10-CM

## 2020-11-07 NOTE — Assessment & Plan Note (Signed)
Continues with the stiolto

## 2020-11-07 NOTE — Progress Notes (Signed)
Subjective:    Patient ID: Lindsay Harmon, female    DOB: 1956-08-21, 64 y.o.   MRN: [id number]  HPI Here for follow up of chronic pain and other medical conditions This visit occurred during the SARS-CoV-2 public health emergency.  Safety protocols were in place, including screening questions prior to the visit, additional usage of staff PPE, and extensive cleaning of exam room while observing appropriate contact time as indicated for disinfecting solutions.   Her legs are better Using the 2nd torsemide dose 1/3rd to 1/2 the time Legs are better---not weeping DOE is somewhat improved Maintains oxygen 4l/min at home--- 5l/min when out No cough  No chest pain No dizziness or syncope  Checks sugars daily 81-155 without low sugar reactions GFR still around 20 Known vascular complications as well  Back pain is about the same Usually takes 4 methadone a day---occasionally needs the 3rd dose (takes 2 at a time)  Current Outpatient Medications on File Prior to Visit  Medication Sig Dispense Refill  . albuterol (PROVENTIL) (2.5 MG/3ML) 0.083% nebulizer solution USE 1 VIAL (3ML) BY NEBULIZATION EVERY 6HOURS AS NEEDED FOR WHEEZING OR SHORTNESS OF BREATH 360 mL 0  . ALPRAZolam (XANAX) 0.25 MG tablet Take 1 tablet (0.25 mg total) by mouth daily as needed for anxiety. 30 tablet 0  . aspirin EC 81 MG tablet Take 1 tablet (81 mg total) by mouth daily. 90 tablet 3  . calcitRIOL (ROCALTROL) 0.5 MCG capsule Take 0.5 mcg by mouth daily.     . cholecalciferol (VITAMIN D) 1000 units tablet Take 1,000 Units by mouth at bedtime.     . ferrous sulfate 325 (65 FE) MG tablet Take 1 tablet by mouth daily.    Marland Kitchen gabapentin (NEURONTIN) 300 MG capsule TAKE 1 CAPSULE BY MOUTH IN THE MORNING AND 2 CAPSULES BY MOUTH AT BEDTIME 270 capsule 3  . glucose blood (ONE TOUCH ULTRA TEST) test strip USE 1 STRIP TO TEST BLOOD SUGAR TWICE DAILY DUE TO LOW BLOOD SUGAR. TAKES LANTUS INSULIN Dx Code E11.51 200 each 3  .  insulin glargine (LANTUS SOLOSTAR) 100 UNIT/ML Solostar Pen Inject 15 Units into the skin daily. 1 mL 0  . Insulin Pen Needle (B-D UF III MINI PEN NEEDLES) 31G X 5 MM MISC USE 1 PEN NEEDLE TO INJECT INSULIN 100 each 5  . loratadine (CLARITIN) 10 MG tablet Take 10 mg by mouth 2 (two) times daily.    Marland Kitchen lovastatin (MEVACOR) 40 MG tablet Take 2 tablets (80 mg total) by mouth at bedtime. 180 tablet 3  . methadone (DOLOPHINE) 10 MG tablet TAKE 2 TABLETS BY MOUTH EVERY 6 HOURS ASNEEDED 240 tablet 0  . metolazone (ZAROXOLYN) 2.5 MG tablet TAKE 1 TABLET BY MOUTH ONCE A WEEK EVERYTUESDAY 5 tablet 3  . metoprolol tartrate (LOPRESSOR) 25 MG tablet Take 1/2 tablet by mouth twice a day 90 tablet 3  . mometasone (ASMANEX, 120 METERED DOSES,) 220 MCG/INH inhaler Inhale 2 puffs into the lungs 2 (two) times daily. 1 each 6  . nitroGLYCERIN (NITROSTAT) 0.4 MG SL tablet Place 1 tablet (0.4 mg total) under the tongue every 5 (five) minutes as needed for chest pain. 90 tablet 1  . pantoprazole (PROTONIX) 40 MG tablet Take 1 tablet by mouth twice a day 180 tablet 3  . potassium chloride SA (KLOR-CON) 20 MEQ tablet Take 2 tablets (40 mEq total) by mouth once a week. Every Tuesday 90 tablet 3  . PROAIR HFA 108 (90 Base) MCG/ACT inhaler USE  2 PUFFS EVERY 6 HOURS AS NEEDED FOR WHEEZING OR SHORTNESS OF BREATH 8.5 g 3  . Respiratory Therapy Supplies (FLUTTER) DEVI Use as directed 1 each 0  . STIOLTO RESPIMAT 2.5-2.5 MCG/ACT AERS INHALE 2 PUFFS BY MOUTH INTO THE LUNGS DAILY 4 g 3  . torsemide (DEMADEX) 20 MG tablet Take 3 tablets (60 mg total) by mouth 2 (two) times daily. Hold afternoon dose if your weight is under 150# at home 540 tablet 0  . Treprostinil (TYVASO) 0.6 MG/ML SOLN Inhale 18 mcg into the lungs 4 (four) times daily.     No current facility-administered medications on file prior to visit.    Allergies  Allergen Reactions  . Sertraline Hcl Other (See Comments)    Altered Mental Status    Past Medical  History:  Diagnosis Date  . Allergy   . Aortic atherosclerosis ([place])   . Arthritis   . CKD (chronic kidney disease), stage IV ([place])   . COPD (chronic obstructive pulmonary disease) ([place])    a. 11/2014 PFT's: FEV1 0.87 (38%), FVC 1.41 (48%), FEF 25-75 0.41 (19%). Unable to perform DLCO; b. 12/2017 Simple spirometry ration 67%. FEV1 1.21 L+53% predicted. FVC 1.79L 61% predicted.  . Coronary artery calcification seen on CT scan    a. 03/2017 CT Chest.  . Depression   . Diabetic neuropathy ([place])   . Erythrocytosis 12/13/2019  . Esophageal stricture   . Familial hematuria   . GERD (gastroesophageal reflux disease)   . Hyperlipidemia   . Hypertension   . IBS (irritable bowel syndrome)   . Nephrolithiasis   . NIDDM (non-insulin dependent diabetes mellitus)   . Obesity, unspecified   . Pulmonary hypertension ([place])    a. WHO Group 3; b. 10/2016 Echo: EF 55-60%, no rwma, Gr1 DD, mild to mod AS, mean grad 1mHg, mildly dil RV w/ mildly reduced RV fxn, mildly dil RA. PASP 1187mg; c. 12/2017 RHC: RA 11, RV 92/12, PA 92/33(57), PCWP 15, Fick CO/CI 5.9/3.3. PVR 7.2 WU. Ao 93%, PA 62/64%.  . Marland KitchenVD (peripheral vascular disease) ([place] [place]    Past Surgical History:  Procedure Laterality Date  . ABDOMINAL HYSTERECTOMY    . CARDIAC CATHETERIZATION    . CARPAL TUNNEL RELEASE    . CESAREAN SECTION    . ERCP N/A 03/17/2017   Procedure: ENDOSCOPIC RETROGRADE CHOLANGIOPANCREATOGRAPHY (ERCP);  Surgeon: WoLucilla LameMD;  [facility];  Service: Endoscopy;  Laterality: N/A;  . EUS N/A 03/13/2017   Procedure: FULL UPPER ENDOSCOPIC ULTRASOUND (EUS) RADIAL;  Surgeon: Burbridge, ReMurray HodgkinsMD;  Location: [facility] ENDOSCOPY;  Service: Endoscopy;  Laterality: N/A;  . RIGHT HEART CATH N/A 01/22/2018   Procedure: RIGHT HEART CATH;  Surgeon: BeJolaine ArtistMD;  [facility];  Service: Cardiovascular;  Laterality: N/A;  . RIGHT HEART CATHETERIZATION N/A 12/05/2014   Procedure: RIGHT HEART CATH;   Surgeon: HeSinclair GroomsMD;  [facility];  Service: Cardiovascular;  Laterality: N/A;    Family History  Problem Relation Age of Onset  . Diabetes Mother   . Cancer Mother        ovarian,melanoma  . Allergies Father   . Cancer Father        lung  . Cancer Maternal Grandmother        uterine  . Emphysema Paternal Grandfather   . Allergies Sister   . Breast cancer Neg Hx     Social History   Socioeconomic History  . Marital status:  Legally Separated    Spouse name: Not on file  . Number of children: 1  . Years of education: Not on file  . Highest education level: Not on file  Occupational History  . 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 Smoking status: Former Smoker    Packs/day: 1.50    Years: 33.00    Pack years: 49.50    Types: Cigarettes    Quit date: 07/28/2005    Years since quitting: 15.2  . Smokeless tobacco: Never Used  Vaping Use  . Vaping Use: Never used  Substance and Sexual Activity  . Alcohol use: No    Alcohol/week: 0.0 standard drinks    Comment: heavy in the past  . Drug use: No  . Sexual activity: Never  Other Topics Concern  . Not on file  Social History Narrative   No living will   Son [name] (then mom) should make decisions for her if she is unable.    Would accept resuscitation attempts but no prolonged ventilation   Not sure about tube feeds but probably wouldn't want them if cognitively unaware   Social Determinants of Health   Financial Resource Strain: Low Risk   . Difficulty of Paying Living Expenses: Not very hard  Food Insecurity: Not on file  Transportation Needs: Not on file  Physical Activity: Not on file  Stress: Not on file  Social Connections: Not on file  Intimate Partner Violence: Not on file   Current Outpatient Medications on File Prior to Visit  Medication Sig Dispense Refill  . albuterol (PROVENTIL) (2.5 MG/3ML) 0.083% nebulizer solution USE 1 VIAL  (3ML) BY NEBULIZATION EVERY 6HOURS AS NEEDED FOR WHEEZING OR SHORTNESS OF BREATH 360 mL 0  . ALPRAZolam (XANAX) 0.25 MG tablet Take 1 tablet (0.25 mg total) by mouth daily as needed for anxiety. 30 tablet 0  . aspirin EC 81 MG tablet Take 1 tablet (81 mg total) by mouth daily. 90 tablet 3  . calcitRIOL (ROCALTROL) 0.5 MCG capsule Take 0.5 mcg by mouth daily.     . cholecalciferol (VITAMIN D) 1000 units tablet Take 1,000 Units by mouth at bedtime.     . ferrous sulfate 325 (65 FE) MG tablet Take 1 tablet by mouth daily.    Marland Kitchen gabapentin (NEURONTIN) 300 MG capsule TAKE 1 CAPSULE BY MOUTH IN THE MORNING AND 2 CAPSULES BY MOUTH AT BEDTIME 270 capsule 3  . glucose blood (ONE TOUCH ULTRA TEST) test strip USE 1 STRIP TO TEST BLOOD SUGAR TWICE DAILY DUE TO LOW BLOOD SUGAR. TAKES LANTUS INSULIN Dx Code E11.51 200 each 3  . insulin glargine (LANTUS SOLOSTAR) 100 UNIT/ML Solostar Pen Inject 15 Units into the skin daily. 1 mL 0  . Insulin Pen Needle (B-D UF III MINI PEN NEEDLES) 31G X 5 MM MISC USE 1 PEN NEEDLE TO INJECT INSULIN 100 each 5  . loratadine (CLARITIN) 10 MG tablet Take 10 mg by mouth 2 (two) times daily.    Marland Kitchen lovastatin (MEVACOR) 40 MG tablet Take 2 tablets (80 mg total) by mouth at bedtime. 180 tablet 3  . methadone (DOLOPHINE) 10 MG tablet TAKE 2 TABLETS BY MOUTH EVERY 6 HOURS ASNEEDED 240 tablet 0  . metolazone (ZAROXOLYN) 2.5 MG tablet TAKE 1 TABLET BY MOUTH ONCE A WEEK EVERYTUESDAY 5 tablet 3  . metoprolol tartrate (LOPRESSOR) 25 MG tablet Take 1/2 tablet by mouth twice a day 90 tablet 3  . mometasone (  ASMANEX, 120 METERED DOSES,) 220 MCG/INH inhaler Inhale 2 puffs into the lungs 2 (two) times daily. 1 each 6  . nitroGLYCERIN (NITROSTAT) 0.4 MG SL tablet Place 1 tablet (0.4 mg total) under the tongue every 5 (five) minutes as needed for chest pain. 90 tablet 1  . pantoprazole (PROTONIX) 40 MG tablet Take 1 tablet by mouth twice a day 180 tablet 3  . potassium chloride SA (KLOR-CON) 20 MEQ  tablet Take 2 tablets (40 mEq total) by mouth once a week. Every Tuesday 90 tablet 3  . PROAIR HFA 108 (90 Base) MCG/ACT inhaler USE 2 PUFFS EVERY 6 HOURS AS NEEDED FOR WHEEZING OR SHORTNESS OF BREATH 8.5 g 3  . Respiratory Therapy Supplies (FLUTTER) DEVI Use as directed 1 each 0  . STIOLTO RESPIMAT 2.5-2.5 MCG/ACT AERS INHALE 2 PUFFS BY MOUTH INTO THE LUNGS DAILY 4 g 3  . torsemide (DEMADEX) 20 MG tablet Take 3 tablets (60 mg total) by mouth 2 (two) times daily. Hold afternoon dose if your weight is under 150# at home 540 tablet 0  . Treprostinil (TYVASO) 0.6 MG/ML SOLN Inhale 18 mcg into the lungs 4 (four) times daily.     No current facility-administered medications on file prior to visit.    Allergies  Allergen Reactions  . Sertraline Hcl Other (See Comments)    Altered Mental Status    Past Medical History:  Diagnosis Date  . Allergy   . Aortic atherosclerosis ([place])   . Arthritis   . CKD (chronic kidney disease), stage IV ([place])   . COPD (chronic obstructive pulmonary disease) ([place])    a. 11/2014 PFT's: FEV1 0.87 (38%), FVC 1.41 (48%), FEF 25-75 0.41 (19%). Unable to perform DLCO; b. 12/2017 Simple spirometry ration 67%. FEV1 1.21 L+53% predicted. FVC 1.79L 61% predicted.  . Coronary artery calcification seen on CT scan    a. 03/2017 CT Chest.  . Depression   . Diabetic neuropathy ([place])   . Erythrocytosis 12/13/2019  . Esophageal stricture   . Familial hematuria   . GERD (gastroesophageal reflux disease)   . Hyperlipidemia   . Hypertension   . IBS (irritable bowel syndrome)   . Nephrolithiasis   . NIDDM (non-insulin dependent diabetes mellitus)   . Obesity, unspecified   . Pulmonary hypertension ([place])    a. WHO Group 3; b. 10/2016 Echo: EF 55-60%, no rwma, Gr1 DD, mild to mod AS, mean grad 64mHg, mildly dil RV w/ mildly reduced RV fxn, mildly dil RA. PASP 1176mg; c. 12/2017 RHC: RA 11, RV 92/12, PA 92/33(57), PCWP 15, Fick CO/CI 5.9/3.3. PVR 7.2 WU. Ao 93%, PA 62/64%.  . Marland KitchenVD  (peripheral vascular disease) ([place]    Past Surgical History:  Procedure Laterality Date  . ABDOMINAL HYSTERECTOMY    . CARDIAC CATHETERIZATION    . CARPAL TUNNEL RELEASE    . CESAREAN SECTION    . ERCP N/A 03/17/2017   Procedure: ENDOSCOPIC RETROGRADE CHOLANGIOPANCREATOGRAPHY (ERCP);  Surgeon: WoLucilla LameMD;  [facility];  Service: Endoscopy;  Laterality: N/A;  . EUS N/A 03/13/2017   Procedure: FULL UPPER ENDOSCOPIC ULTRASOUND (EUS) RADIAL;  Surgeon: Burbridge, ReMurray HodgkinsMD;  Location: [facility] ENDOSCOPY;  Service: Endoscopy;  Laterality: N/A;  . RIGHT HEART CATH N/A 01/22/2018   Procedure: RIGHT HEART CATH;  Surgeon: BeJolaine ArtistMD;  [facility];  Service: Cardiovascular;  Laterality: N/A;  . RIGHT HEART CATHETERIZATION N/A 12/05/2014   Procedure: RIGHT HEART CATH;  Surgeon: HeBelva Crome  III, MD;  Location: [facility] CATH LAB;  Service: Cardiovascular;  Laterality: N/A;    Family History  Problem Relation Age of Onset  . Diabetes Mother   . Cancer Mother        ovarian,melanoma  . Allergies Father   . Cancer Father        lung  . Cancer Maternal Grandmother        uterine  . Emphysema Paternal Grandfather   . Allergies Sister   . Breast cancer Neg Hx     Social History   Socioeconomic History  . Marital status: Legally Separated    Spouse name: Not on file  . Number of children: 1  . Years of education: Not on file  . Highest education level: Not on file  Occupational History  . 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 Smoking status: Former Smoker    Packs/day: 1.50    Years: 33.00    Pack years: 49.50    Types: Cigarettes    Quit date: 07/28/2005    Years since quitting: 15.2  . Smokeless tobacco: Never Used  Vaping Use  . Vaping Use: Never used  Substance and Sexual Activity  . Alcohol use: No    Alcohol/week: 0.0 standard drinks    Comment: heavy in the past  . Drug use: No  .  Sexual activity: Never  Other Topics Concern  . Not on file  Social History Narrative   No living will   Son [name] (then mom) should make decisions for her if she is unable.    Would accept resuscitation attempts but no prolonged ventilation   Not sure about tube feeds but probably wouldn't want them if cognitively unaware   Social Determinants of Health   Financial Resource Strain: Low Risk   . Difficulty of Paying Living Expenses: Not very hard  Food Insecurity: Not on file  Transportation Needs: Not on file  Physical Activity: Not on file  Stress: Not on file  Social Connections: Not on file  Intimate Partner Violence: Not on file   Review of Systems  Sleeps a bit better now Appetite is fine Did lose ~5# with the extra torsemide     Objective:   Physical Exam Constitutional:      Appearance: Normal appearance.     Comments: Oxygen on  Cardiovascular:     Rate and Rhythm: Normal rate and regular rhythm.     Heart sounds: No gallop.      Comments: No pedal pulses Gr 3/6 coarse systolic murmur towards the base Pulmonary:     Effort: Pulmonary effort is normal.     Breath sounds: Normal breath sounds. No wheezing or rales.  Musculoskeletal:     Cervical back: Neck supple.     Comments: Tense calves but not overly swollen. No weeping Purplish feet  Lymphadenopathy:     Cervical: No cervical adenopathy.  Skin:    Comments: No foot lesions  Neurological:     Mental Status: She is alert.  Psychiatric:        Mood and Affect: Mood normal.        Behavior: Behavior normal.            Assessment & Plan:

## 2020-11-07 NOTE — Assessment & Plan Note (Signed)
Doing better with the prn 2nd dose of the torsemide

## 2020-11-07 NOTE — Assessment & Plan Note (Signed)
Stable with the tyvaso

## 2020-11-07 NOTE — Assessment & Plan Note (Signed)
Does okay with 2-3 doses of the methadone most days

## 2020-11-07 NOTE — Assessment & Plan Note (Signed)
On oxygen 4-5 liter/min all the time

## 2020-11-07 NOTE — Assessment & Plan Note (Signed)
PDMP reviewed No concerns 

## 2020-11-07 NOTE — Assessment & Plan Note (Addendum)
Doing well on low dose lantus---13 units daily Lab Results  Component Value Date   HGBA1C 6.5 (A) 08/02/2020   Will recheck next time

## 2020-11-08 LAB — DRUG MONITORING, PANEL 8 WITH CONFIRMATION, URINE
6 Acetylmorphine: NEGATIVE ng/mL (ref <10)
Alcohol Metabolites: NEGATIVE ng/mL
Amphetamines: NEGATIVE ng/mL (ref <500)
Benzodiazepines: NEGATIVE ng/mL (ref <100)
Buprenorphine, Urine: NEGATIVE ng/mL (ref <5)
Cocaine Metabolite: NEGATIVE ng/mL (ref <150)
Creatinine: 44.3 mg/dL
MDMA: NEGATIVE ng/mL (ref <500)
Marijuana Metabolite: NEGATIVE ng/mL (ref <20)
Opiates: NEGATIVE ng/mL (ref <100)
Oxidant: NEGATIVE ug/mL
Oxycodone: NEGATIVE ng/mL (ref <100)
pH: 7.3 (ref 4.5–9.0)

## 2020-11-08 LAB — DM TEMPLATE

## 2020-11-24 ENCOUNTER — Inpatient Hospital Stay
Admission: EM | Admit: 2020-11-24 | Discharge: 2020-11-28 | DRG: 872 | Disposition: A | Discharge status: Home / Self Care | Payer: PPO | Attending: Internal Medicine | Admitting: Internal Medicine

## 2020-11-24 ENCOUNTER — Emergency Department: Payer: PPO

## 2020-11-24 ENCOUNTER — Ambulatory Visit: Payer: PPO | Admitting: Podiatry

## 2020-11-24 ENCOUNTER — Ambulatory Visit (INDEPENDENT_AMBULATORY_CARE_PROVIDER_SITE_OTHER): Payer: PPO

## 2020-11-24 ENCOUNTER — Encounter: Payer: Self-pay | Admitting: Emergency Medicine

## 2020-11-24 ENCOUNTER — Encounter: Payer: Self-pay | Admitting: Podiatry

## 2020-11-24 ENCOUNTER — Other Ambulatory Visit: Payer: Self-pay

## 2020-11-24 DIAGNOSIS — K219 Gastro-esophageal reflux disease without esophagitis: Secondary | ICD-10-CM | POA: Diagnosis not present

## 2020-11-24 DIAGNOSIS — I10 Essential (primary) hypertension: Secondary | ICD-10-CM | POA: Diagnosis not present

## 2020-11-24 DIAGNOSIS — J449 Chronic obstructive pulmonary disease, unspecified: Secondary | ICD-10-CM | POA: Diagnosis present

## 2020-11-24 DIAGNOSIS — A419 Sepsis, unspecified organism: Principal | ICD-10-CM | POA: Diagnosis present

## 2020-11-24 DIAGNOSIS — Z794 Long term (current) use of insulin: Secondary | ICD-10-CM | POA: Diagnosis not present

## 2020-11-24 DIAGNOSIS — Z7189 Other specified counseling: Secondary | ICD-10-CM

## 2020-11-24 DIAGNOSIS — E1142 Type 2 diabetes mellitus with diabetic polyneuropathy: Secondary | ICD-10-CM | POA: Diagnosis not present

## 2020-11-24 DIAGNOSIS — M549 Dorsalgia, unspecified: Secondary | ICD-10-CM | POA: Diagnosis not present

## 2020-11-24 DIAGNOSIS — L03115 Cellulitis of right lower limb: Secondary | ICD-10-CM | POA: Diagnosis present

## 2020-11-24 DIAGNOSIS — L02416 Cutaneous abscess of left lower limb: Secondary | ICD-10-CM | POA: Diagnosis present

## 2020-11-24 DIAGNOSIS — I130 Hypertensive heart and chronic kidney disease with heart failure and stage 1 through stage 4 chronic kidney disease, or unspecified chronic kidney disease: Secondary | ICD-10-CM | POA: Diagnosis present

## 2020-11-24 DIAGNOSIS — E1149 Type 2 diabetes mellitus with other diabetic neurological complication: Secondary | ICD-10-CM | POA: Diagnosis not present

## 2020-11-24 DIAGNOSIS — M7989 Other specified soft tissue disorders: Secondary | ICD-10-CM | POA: Diagnosis not present

## 2020-11-24 DIAGNOSIS — Z79899 Other long term (current) drug therapy: Secondary | ICD-10-CM | POA: Diagnosis not present

## 2020-11-24 DIAGNOSIS — I5032 Chronic diastolic (congestive) heart failure: Secondary | ICD-10-CM | POA: Diagnosis not present

## 2020-11-24 DIAGNOSIS — Z833 Family history of diabetes mellitus: Secondary | ICD-10-CM

## 2020-11-24 DIAGNOSIS — F419 Anxiety disorder, unspecified: Secondary | ICD-10-CM | POA: Diagnosis not present

## 2020-11-24 DIAGNOSIS — I2729 Other secondary pulmonary hypertension: Secondary | ICD-10-CM | POA: Diagnosis not present

## 2020-11-24 DIAGNOSIS — Z872 Personal history of diseases of the skin and subcutaneous tissue: Secondary | ICD-10-CM | POA: Diagnosis not present

## 2020-11-24 DIAGNOSIS — L03116 Cellulitis of left lower limb: Secondary | ICD-10-CM | POA: Diagnosis present

## 2020-11-24 DIAGNOSIS — N184 Chronic kidney disease, stage 4 (severe): Secondary | ICD-10-CM | POA: Diagnosis not present

## 2020-11-24 DIAGNOSIS — Z825 Family history of asthma and other chronic lower respiratory diseases: Secondary | ICD-10-CM

## 2020-11-24 DIAGNOSIS — M1990 Unspecified osteoarthritis, unspecified site: Secondary | ICD-10-CM | POA: Diagnosis not present

## 2020-11-24 DIAGNOSIS — L03119 Cellulitis of unspecified part of limb: Secondary | ICD-10-CM | POA: Diagnosis present

## 2020-11-24 DIAGNOSIS — J9611 Chronic respiratory failure with hypoxia: Secondary | ICD-10-CM | POA: Diagnosis present

## 2020-11-24 DIAGNOSIS — E785 Hyperlipidemia, unspecified: Secondary | ICD-10-CM | POA: Diagnosis present

## 2020-11-24 DIAGNOSIS — Z20822 Contact with and (suspected) exposure to covid-19: Secondary | ICD-10-CM | POA: Diagnosis not present

## 2020-11-24 DIAGNOSIS — E11628 Type 2 diabetes mellitus with other skin complications: Principal | ICD-10-CM | POA: Diagnosis present

## 2020-11-24 DIAGNOSIS — Z9049 Acquired absence of other specified parts of digestive tract: Secondary | ICD-10-CM

## 2020-11-24 DIAGNOSIS — I272 Pulmonary hypertension, unspecified: Secondary | ICD-10-CM

## 2020-11-24 DIAGNOSIS — R52 Pain, unspecified: Secondary | ICD-10-CM

## 2020-11-24 DIAGNOSIS — G8929 Other chronic pain: Secondary | ICD-10-CM | POA: Diagnosis present

## 2020-11-24 DIAGNOSIS — Z87891 Personal history of nicotine dependence: Secondary | ICD-10-CM

## 2020-11-24 DIAGNOSIS — Z66 Do not resuscitate: Secondary | ICD-10-CM | POA: Diagnosis not present

## 2020-11-24 DIAGNOSIS — I739 Peripheral vascular disease, unspecified: Secondary | ICD-10-CM

## 2020-11-24 DIAGNOSIS — E11621 Type 2 diabetes mellitus with foot ulcer: Secondary | ICD-10-CM | POA: Diagnosis not present

## 2020-11-24 DIAGNOSIS — Z7982 Long term (current) use of aspirin: Secondary | ICD-10-CM

## 2020-11-24 DIAGNOSIS — E1122 Type 2 diabetes mellitus with diabetic chronic kidney disease: Secondary | ICD-10-CM | POA: Diagnosis not present

## 2020-11-24 DIAGNOSIS — R600 Localized edema: Secondary | ICD-10-CM | POA: Diagnosis not present

## 2020-11-24 DIAGNOSIS — I2781 Cor pulmonale (chronic): Secondary | ICD-10-CM | POA: Diagnosis not present

## 2020-11-24 LAB — URINALYSIS, COMPLETE (UACMP) WITH MICROSCOPIC
Bacteria, UA: NONE SEEN
Bilirubin Urine: NEGATIVE
Glucose, UA: NEGATIVE mg/dL
Hgb urine dipstick: NEGATIVE
Ketones, ur: NEGATIVE mg/dL
Leukocytes,Ua: NEGATIVE
Nitrite: NEGATIVE
Protein, ur: 30 mg/dL — AB
Specific Gravity, Urine: 1.006 (ref 1.005–1.030)
pH: 6 (ref 5.0–8.0)

## 2020-11-24 LAB — COMPREHENSIVE METABOLIC PANEL
ALT: 35 U/L (ref 0–44)
AST: 44 U/L — ABNORMAL HIGH (ref 15–41)
Albumin: 3.7 g/dL (ref 3.5–5.0)
Alkaline Phosphatase: 65 U/L (ref 38–126)
Anion gap: 15 (ref 5–15)
BUN: 49 mg/dL — ABNORMAL HIGH (ref 8–23)
CO2: 35 mmol/L — ABNORMAL HIGH (ref 22–32)
Calcium: 10.3 mg/dL (ref 8.9–10.3)
Chloride: 88 mmol/L — ABNORMAL LOW (ref 98–111)
Creatinine, Ser: 2.87 mg/dL — ABNORMAL HIGH (ref 0.44–1.00)
GFR, Estimated: 18 mL/min — ABNORMAL LOW (ref >60)
Glucose, Bld: 184 mg/dL — ABNORMAL HIGH (ref 70–99)
Potassium: 3.9 mmol/L (ref 3.5–5.1)
Sodium: 138 mmol/L (ref 135–145)
Total Bilirubin: 2 mg/dL — ABNORMAL HIGH (ref 0.3–1.2)
Total Protein: 7.1 g/dL (ref 6.5–8.1)

## 2020-11-24 LAB — CBC WITH DIFFERENTIAL/PLATELET
Abs Immature Granulocytes: 0.02 10*3/uL (ref 0.00–0.07)
Basophils Absolute: 0.1 10*3/uL (ref 0.0–0.1)
Basophils Relative: 1 %
Eosinophils Absolute: 0 10*3/uL (ref 0.0–0.5)
Eosinophils Relative: 0 %
HCT: 47.1 % — ABNORMAL HIGH (ref 36.0–46.0)
Hemoglobin: 15.4 g/dL — ABNORMAL HIGH (ref 12.0–15.0)
Immature Granulocytes: 0 %
Lymphocytes Relative: 13 %
Lymphs Abs: 1.1 10*3/uL (ref 0.7–4.0)
MCH: 30.1 pg (ref 26.0–34.0)
MCHC: 32.7 g/dL (ref 30.0–36.0)
MCV: 92.2 fL (ref 80.0–100.0)
Monocytes Absolute: 0.6 10*3/uL (ref 0.1–1.0)
Monocytes Relative: 8 %
Neutro Abs: 6.3 10*3/uL (ref 1.7–7.7)
Neutrophils Relative %: 78 %
Platelets: 174 10*3/uL (ref 150–400)
RBC: 5.11 MIL/uL (ref 3.87–5.11)
RDW: 17.2 % — ABNORMAL HIGH (ref 11.5–15.5)
WBC: 8.1 10*3/uL (ref 4.0–10.5)
nRBC: 0 % (ref 0.0–0.2)

## 2020-11-24 LAB — LACTIC ACID, PLASMA
Lactic Acid, Venous: 2 mmol/L (ref 0.5–1.9)
Lactic Acid, Venous: 4.3 mmol/L (ref 0.5–1.9)

## 2020-11-24 MED ORDER — ACETAMINOPHEN 325 MG PO TABS
325.0000 mg | ORAL_TABLET | Freq: Four times a day (QID) | ORAL | Status: DC | PRN
  Administered 2020-11-25 – 2020-11-27 (×4): 325 mg via ORAL
  Filled 2020-11-24 (×5): qty 1

## 2020-11-24 MED ORDER — SODIUM CHLORIDE 0.9 % IV SOLN
2.0000 g | Freq: Once | INTRAVENOUS | Status: AC
  Administered 2020-11-25: 2 g via INTRAVENOUS
  Filled 2020-11-24: qty 2

## 2020-11-24 MED ORDER — ALPRAZOLAM 0.5 MG PO TABS: 0.2500 mg | ORAL_TABLET | Freq: Every day | ORAL | Status: DC | PRN

## 2020-11-24 MED ORDER — PRAVASTATIN SODIUM 20 MG PO TABS
40.0000 mg | ORAL_TABLET | Freq: Every day | ORAL | Status: DC
  Administered 2020-11-25 – 2020-11-27 (×3): 40 mg via ORAL
  Filled 2020-11-24: qty 2
  Filled 2020-11-24 (×2): qty 1

## 2020-11-24 MED ORDER — MOMETASONE FUROATE 220 MCG/INH IN AEPB: 2.0000 | INHALATION_SPRAY | Freq: Two times a day (BID) | RESPIRATORY_TRACT | Status: DC

## 2020-11-24 MED ORDER — PANTOPRAZOLE SODIUM 40 MG PO TBEC
40.0000 mg | DELAYED_RELEASE_TABLET | Freq: Two times a day (BID) | ORAL | Status: DC
  Administered 2020-11-25 – 2020-11-28 (×8): 40 mg via ORAL
  Filled 2020-11-24 (×8): qty 1

## 2020-11-24 MED ORDER — ONDANSETRON HCL 4 MG/2ML IJ SOLN: 4.0000 mg | Freq: Four times a day (QID) | INTRAMUSCULAR | Status: DC | PRN

## 2020-11-24 MED ORDER — VITAMIN D 25 MCG (1000 UNIT) PO TABS
1000.0000 [IU] | ORAL_TABLET | Freq: Every day | ORAL | Status: DC
  Administered 2020-11-25 – 2020-11-27 (×3): 1000 [IU] via ORAL
  Filled 2020-11-24 (×3): qty 1

## 2020-11-24 MED ORDER — CALCITRIOL 0.25 MCG PO CAPS
0.5000 ug | ORAL_CAPSULE | Freq: Every day | ORAL | Status: DC
  Administered 2020-11-25 – 2020-11-28 (×4): 0.5 ug via ORAL
  Filled 2020-11-24 (×4): qty 2

## 2020-11-24 MED ORDER — INSULIN GLARGINE 100 UNIT/ML ~~LOC~~ SOLN
13.0000 [IU] | Freq: Every day | SUBCUTANEOUS | Status: DC
  Administered 2020-11-25 – 2020-11-28 (×4): 13 [IU] via SUBCUTANEOUS
  Filled 2020-11-24 (×5): qty 0.13

## 2020-11-24 MED ORDER — INSULIN ASPART 100 UNIT/ML ~~LOC~~ SOLN: 0.0000 [IU] | Freq: Every day | SUBCUTANEOUS | Status: DC

## 2020-11-24 MED ORDER — HEPARIN SODIUM (PORCINE) 5000 UNIT/ML IJ SOLN
5000.0000 [IU] | Freq: Three times a day (TID) | INTRAMUSCULAR | Status: DC
  Administered 2020-11-25 – 2020-11-28 (×10): 5000 [IU] via SUBCUTANEOUS
  Filled 2020-11-24 (×9): qty 1

## 2020-11-24 MED ORDER — METOPROLOL TARTRATE 25 MG PO TABS
12.5000 mg | ORAL_TABLET | Freq: Two times a day (BID) | ORAL | Status: DC
  Administered 2020-11-25 – 2020-11-28 (×6): 12.5 mg via ORAL
  Filled 2020-11-24 (×8): qty 1

## 2020-11-24 MED ORDER — VANCOMYCIN HCL IN DEXTROSE 1-5 GM/200ML-% IV SOLN
1000.0000 mg | Freq: Once | INTRAVENOUS | Status: AC
  Administered 2020-11-24: 1000 mg via INTRAVENOUS
  Filled 2020-11-24: qty 200

## 2020-11-24 MED ORDER — SODIUM CHLORIDE 0.9 % IV BOLUS
1000.0000 mL | Freq: Once | INTRAVENOUS | Status: AC
  Administered 2020-11-24: 1000 mL via INTRAVENOUS

## 2020-11-24 MED ORDER — ONDANSETRON HCL 4 MG PO TABS: 4.0000 mg | ORAL_TABLET | Freq: Four times a day (QID) | ORAL | Status: DC | PRN

## 2020-11-24 MED ORDER — ALBUTEROL SULFATE (2.5 MG/3ML) 0.083% IN NEBU: 2.5000 mg | INHALATION_SOLUTION | RESPIRATORY_TRACT | Status: DC | PRN

## 2020-11-24 MED ORDER — LACTATED RINGERS IV SOLN: INTRAVENOUS | Status: AC

## 2020-11-24 MED ORDER — INSULIN ASPART 100 UNIT/ML ~~LOC~~ SOLN
0.0000 [IU] | Freq: Three times a day (TID) | SUBCUTANEOUS | Status: DC
  Administered 2020-11-25: 3 [IU] via SUBCUTANEOUS
  Administered 2020-11-25 – 2020-11-26 (×2): 1 [IU] via SUBCUTANEOUS
  Administered 2020-11-26 – 2020-11-27 (×3): 2 [IU] via SUBCUTANEOUS
  Administered 2020-11-28: 3 [IU] via SUBCUTANEOUS
  Filled 2020-11-24 (×7): qty 1

## 2020-11-24 MED ORDER — GABAPENTIN 300 MG PO CAPS: 300.0000 mg | ORAL_CAPSULE | Freq: Three times a day (TID) | ORAL | Status: DC

## 2020-11-24 MED ORDER — ASPIRIN EC 81 MG PO TBEC
81.0000 mg | DELAYED_RELEASE_TABLET | Freq: Every day | ORAL | Status: DC
  Administered 2020-11-25 – 2020-11-28 (×3): 81 mg via ORAL
  Filled 2020-11-24 (×3): qty 1

## 2020-11-24 MED ORDER — TORSEMIDE 20 MG PO TABS
60.0000 mg | ORAL_TABLET | Freq: Two times a day (BID) | ORAL | Status: DC
  Administered 2020-11-25 – 2020-11-26 (×4): 60 mg via ORAL
  Filled 2020-11-24 (×5): qty 3

## 2020-11-24 MED ORDER — NITROGLYCERIN 0.4 MG SL SUBL: 0.4000 mg | SUBLINGUAL_TABLET | SUBLINGUAL | Status: DC | PRN

## 2020-11-24 MED ORDER — LACTATED RINGERS IV BOLUS
1000.0000 mL | Freq: Once | INTRAVENOUS | Status: AC
  Administered 2020-11-25: 1000 mL via INTRAVENOUS

## 2020-11-24 MED ORDER — ACETAMINOPHEN 650 MG RE SUPP: 325.0000 mg | Freq: Four times a day (QID) | RECTAL | Status: DC | PRN

## 2020-11-24 MED ORDER — TREPROSTINIL 0.6 MG/ML IN SOLN: 18.0000 ug | Freq: Four times a day (QID) | RESPIRATORY_TRACT | Status: DC

## 2020-11-24 MED ORDER — ALBUTEROL SULFATE HFA 108 (90 BASE) MCG/ACT IN AERS
2.0000 | INHALATION_SPRAY | RESPIRATORY_TRACT | Status: DC | PRN
  Filled 2020-11-24: qty 6.7

## 2020-11-24 MED ORDER — VANCOMYCIN HCL IN DEXTROSE 1-5 GM/200ML-% IV SOLN: 1000.0000 mg | Freq: Once | INTRAVENOUS | Status: DC

## 2020-11-24 NOTE — ED Notes (Signed)
Pt given drink and dinner tray with verbal okay from provider [place].

## 2020-11-24 NOTE — ED Notes (Signed)
Have called lab 3 times at [phone] and [phone] without answer. If lactic doesn't result soon will recollect.

## 2020-11-24 NOTE — ED Notes (Signed)
Pt remains on her normal home O2.

## 2020-11-24 NOTE — ED Provider Notes (Signed)
[facility] Emergency Department Provider Note  ____________________________________________  Time seen: Approximately 6:07 PM  I have reviewed the triage vital signs and the nursing notes.   HISTORY  Chief Complaint Foot Pain    HPI Lindsay Harmon is a 65 y.o. female who presents the emergency department from podiatry for evaluation for IV antibiotics and admission.  Patient is complaining of right lower extremity pain, erythema, edema.  Patient has a history of diabetes and states that she has had multiple family members with infections that required amputation.  She follows her foot care religiously.  She denies any injury or ulcerations prior to the onset of pain and erythema.  She states that this first presented roughly 2 days ago and has rapidly worsened.  No fevers or chills.  She states that the erythema began on the dorsal aspect of the foot now encompasses the foot, lower portion of her leg.  She saw podiatry earlier today and they recommended presentation to the emergency department for admission for IV antibiotics.  Patient has a history of arthritis, chronic kidney disease, COPD, diabetes, diabetic neuropathy, GERD, hyperlipidemia, hypertension.  No complaints with chronic medical issues.         Past Medical History:  Diagnosis Date  . Allergy   . Aortic atherosclerosis ([place])   . Arthritis   . CKD (chronic kidney disease), stage IV ([place])   . COPD (chronic obstructive pulmonary disease) ([place])    a. 11/2014 PFT's: FEV1 0.87 (38%), FVC 1.41 (48%), FEF 25-75 0.41 (19%). Unable to perform DLCO; b. 12/2017 Simple spirometry ration 67%. FEV1 1.21 L+53% predicted. FVC 1.79L 61% predicted.  . Coronary artery calcification seen on CT scan    a. 03/2017 CT Chest.  . Depression   . Diabetic neuropathy ([place])   . Erythrocytosis 12/13/2019  . Esophageal stricture   . Familial hematuria   . GERD (gastroesophageal reflux disease)   . Hyperlipidemia   .  Hypertension   . IBS (irritable bowel syndrome)   . Nephrolithiasis   . NIDDM (non-insulin dependent diabetes mellitus)   . Obesity, unspecified   . Pulmonary hypertension ([place])    a. WHO Group 3; b. 10/2016 Echo: EF 55-60%, no rwma, Gr1 DD, mild to mod AS, mean grad 45mHg, mildly dil RV w/ mildly reduced RV fxn, mildly dil RA. PASP 1131mg; c. 12/2017 RHC: RA 11, RV 92/12, PA 92/33(57), PCWP 15, Fick CO/CI 5.9/3.3. PVR 7.2 WU. Ao 93%, PA 62/64%.  . Marland KitchenVD (peripheral vascular disease) ([facility]    Patient Active Problem List   Diagnosis Date Noted  . Cellulitis and abscess of left leg 11/24/2020  . Bursitis of left elbow 05/05/2020  . Right hip pain 05/05/2020  . Pre-ulcerative calluses 04/10/2020  . Pain due to onychomycosis of toenails of both feet 01/13/2020  . Porokeratosis 01/13/2020  . Erythrocytosis 12/13/2019  . Senile purpura (HCBagley11/30/2020  . Foot pain, bilateral 03/03/2019  . Aortic atherosclerosis (HCExira  . Narcotic dependence (HCCrescent09/26/2018  . Encounter for chronic pain management 03/21/2017  . Cor pulmonale, chronic (HCPlandome Heights  . Pulmonary hypertension (HCPlain[place]  . RVF (right ventricular failure) (HCPembina03/22/2016  . Chronic respiratory failure with hypoxia (HCEdmonson02/18/2016  . COPD (chronic obstructive pulmonary disease) (HCCologne02/18/2016  . Chronic diastolic heart failure (HCEpes02/05/2015  . Chronic renal disease, stage IV (HCScotland Neck02/01/2015  . Advance directive discussed with patient 11/08/2014  . Diabetes, polyneuropathy (HCHillsboro11/19/2014  . Routine general medical examination at  a health care facility 08/13/2011  . Atherosclerotic heart disease of native coronary artery with angina pectoris ([place]) 02/27/2007  . Chronic back pain 02/27/2007  . Type 2 diabetes mellitus with neurological manifestations, controlled ([place]) 02/26/2007  . Episodic mood disorder ([place]) 02/26/2007  . Essential hypertension, benign 02/26/2007  . PVD (peripheral vascular disease) ([place]) 02/26/2007  .  GERD 02/26/2007  . DEGENERATIVE JOINT DISEASE 02/26/2007    Past Surgical History:  Procedure Laterality Date  . ABDOMINAL HYSTERECTOMY    . CARDIAC CATHETERIZATION    . CARPAL TUNNEL RELEASE    . CESAREAN SECTION    . ERCP N/A 03/17/2017   Procedure: ENDOSCOPIC RETROGRADE CHOLANGIOPANCREATOGRAPHY (ERCP);  Surgeon: [name], MD;  Location: [facility] ENDOSCOPY;  Service: Endoscopy;  Laterality: N/A;  . EUS N/A 03/13/2017   Procedure: FULL UPPER ENDOSCOPIC ULTRASOUND (EUS) RADIAL;  Surgeon: Burbridge, Murray Hodgkins, MD;  Location: [facility] ENDOSCOPY;  Service: Endoscopy;  Laterality: N/A;  . RIGHT HEART CATH N/A 01/22/2018   Procedure: RIGHT HEART CATH;  Surgeon: Jolaine Artist, MD;  Location: [facility] CV LAB;  Service: Cardiovascular;  Laterality: N/A;  . RIGHT HEART CATHETERIZATION N/A 12/05/2014   Procedure: RIGHT HEART CATH;  Surgeon: Sinclair Grooms, MD;  Location[facility] CATH LAB;  Service: Cardiovascular;  Laterality: N/A;    Prior to Admission medications   Medication Sig Start Date End Date Taking? Authorizing Provider  albuterol (PROVENTIL) (2.5 MG/3ML) 0.083% nebulizer solution USE 1 VIAL (3ML) BY NEBULIZATION EVERY 6HOURS AS NEEDED FOR WHEEZING OR SHORTNESS OF BREATH 11/10/19   Martyn Ehrich, NP  ALPRAZolam Duanne Moron) 0.25 MG tablet Take 1 tablet (0.25 mg total) by mouth daily as needed for anxiety. 07/24/18   Venia Carbon, MD  aspirin EC 81 MG tablet Take 1 tablet (81 mg total) by mouth daily. 03/23/19   Wellington Hampshire, MD  calcitRIOL (ROCALTROL) 0.5 MCG capsule Take 0.5 mcg by mouth daily.  02/03/17   [provider]  cholecalciferol (VITAMIN D) 1000 units tablet Take 1,000 Units by mouth at bedtime.     [provider]  ferrous sulfate 325 (65 FE) MG tablet Take 1 tablet by mouth daily. 08/03/20 08/03/21  [provider]  gabapentin (NEURONTIN) 300 MG capsule TAKE 1 CAPSULE BY MOUTH IN THE MORNING AND 2 CAPSULES BY MOUTH AT BEDTIME 03/10/20   Viviana Simpler I, MD  glucose blood (ONE TOUCH ULTRA TEST) test strip USE 1 STRIP TO TEST BLOOD SUGAR TWICE DAILY DUE TO LOW BLOOD SUGAR. TAKES LANTUS INSULIN Dx Code E11.51 08/10/20   Viviana Simpler I, MD  insulin glargine (LANTUS SOLOSTAR) 100 UNIT/ML Solostar Pen Inject 15 Units into the skin daily. 08/02/20   Venia Carbon, MD  Insulin Pen Needle (B-D UF III MINI PEN NEEDLES) 31G X 5 MM MISC USE 1 PEN NEEDLE TO INJECT INSULIN 01/11/20   Venia Carbon, MD  loratadine (CLARITIN) 10 MG tablet Take 10 mg by mouth 2 (two) times daily.    Tisovec, Fransico Him, MD  lovastatin (MEVACOR) 40 MG tablet Take 2 tablets (80 mg total) by mouth at bedtime. 09/16/19   Venia Carbon, MD  methadone (DOLOPHINE) 10 MG tablet TAKE 2 TABLETS BY MOUTH EVERY 6 HOURS ASNEEDED 11/01/20   Venia Carbon, MD  metolazone (ZAROXOLYN) 2.5 MG tablet TAKE 1 TABLET BY MOUTH ONCE A WEEK EVERYTUESDAY 07/06/20   Bensimhon, Shaune Pascal, MD  metoprolol tartrate (LOPRESSOR) 25 MG tablet Take 1/2 tablet by mouth twice a  day 05/09/20   Venia Carbon, MD  mometasone (ASMANEX, 120 METERED DOSES,) 220 MCG/INH inhaler Inhale 2 puffs into the lungs 2 (two) times daily. 07/04/20   Martyn Ehrich, NP  nitroGLYCERIN (NITROSTAT) 0.4 MG SL tablet Place 1 tablet (0.4 mg total) under the tongue every 5 (five) minutes as needed for chest pain. 06/06/20   Wellington Hampshire, MD  pantoprazole (PROTONIX) 40 MG tablet Take 1 tablet by mouth twice a day 06/22/20   Venia Carbon, MD  potassium chloride SA (KLOR-CON) 20 MEQ tablet Take 2 tablets (40 mEq total) by mouth once a week. Every Tuesday 03/08/20   Bensimhon, Shaune Pascal, MD  PROAIR HFA 108 [phone] Base) MCG/ACT inhaler USE 2 PUFFS EVERY 6 HOURS AS NEEDED FOR WHEEZING OR SHORTNESS OF BREATH 05/16/20   Laurin Coder, MD  Respiratory Therapy Supplies (FLUTTER) DEVI Use as directed 12/10/19   Tyler Pita, MD  STIOLTO RESPIMAT 2.5-2.5 MCG/ACT AERS INHALE 2 PUFFS BY MOUTH INTO THE LUNGS DAILY 10/02/20    Sherrilyn Rist A, MD  torsemide (DEMADEX) 20 MG tablet Take 3 tablets (60 mg total) by mouth 2 (two) times daily. Hold afternoon dose if your weight is under 150# at home 10/10/20   Viviana Simpler I, MD  Treprostinil (TYVASO) 0.6 MG/ML SOLN Inhale 18 mcg into the lungs 4 (four) times daily.    [provider]    Allergies Sertraline hcl  Family History  Problem Relation Age of Onset  . Diabetes Mother   . Cancer Mother        ovarian,melanoma  . Allergies Father   . Cancer Father        lung  . Cancer Maternal Grandmother        uterine  . Emphysema Paternal Grandfather   . Allergies Sister   . Breast cancer Neg Hx     Social History Social History   Tobacco Use  . Smoking status: Former Smoker    Packs/day: 1.50    Years: 33.00    Pack years: 49.50    Types: Cigarettes    Quit date: 07/28/2005    Years since quitting: 15.3  . Smokeless tobacco: Never Used  Vaping Use  . Vaping Use: Never used  Substance Use Topics  . Alcohol use: No    Alcohol/week: 0.0 standard drinks    Comment: heavy in the past  . Drug use: No     Review of Systems  Constitutional: No fever/chills Eyes: No visual changes. No discharge ENT: No upper respiratory complaints. Cardiovascular: no chest pain. Respiratory: no cough. No SOB. Gastrointestinal: No abdominal pain.  No nausea, no vomiting.  No diarrhea.  No constipation. Musculoskeletal: Possible infection to the right foot and lower leg Skin: Negative for rash, abrasions, lacerations, ecchymosis. Neurological: Negative for headaches, focal weakness or numbness.  10 System ROS otherwise negative.  ____________________________________________   PHYSICAL EXAM:  VITAL SIGNS: ED Triage Vitals  Enc Vitals Group     BP 11/24/20 1725 (!) 103/58     Pulse Rate 11/24/20 1725 80     Resp 11/24/20 1725 18     Temp 11/24/20 1725 98.8 F (37.1 C)     Temp Source 11/24/20 1725 Oral     SpO2 11/24/20 1725 90 %     Weight  11/24/20 1726 150 lb (68 kg)     Height 11/24/20 1726 _0  (1.549 m)     Head Circumference --      Peak  Flow --      Pain Score 11/24/20 1726 3     Pain Loc --      Pain Edu? --      Excl. in [place]? --      Constitutional: Alert and oriented. Well appearing and in no acute distress. Eyes: Conjunctivae are normal. PERRL. EOMI. Head: Atraumatic. ENT:      Ears:       Nose: No congestion/rhinnorhea.      Mouth/Throat: Mucous membranes are moist.  Neck: No stridor.    Cardiovascular: Normal rate, regular rhythm. Normal S1 and S2.  Good peripheral circulation. Respiratory: Normal respiratory effort without tachypnea or retractions. Lungs CTAB. Good air entry to the bases with no decreased or absent breath sounds. Musculoskeletal: Full range of motion to all extremities. No gross deformities appreciated.  Visualization of the right lower extremity reveals erythema, mild edema of the foot, lower portion of the right leg.  No open wounds or ulcers identified.  Patient with generalized soft tissue tenderness over the foot and calf.  Mild warmth to palpation.  Patient does have significant erythema in this area.  There is some mild erythema along the shin of the left lower extremity which she states is chronic and unchanged.  She states that both lower extremities have some venous stasis with this chronic erythema but the erythema of the right lower extremity has changed and involves the foot and calf.  Dorsalis pedis pulse intact.  Sensation decreased bilaterally in her toes but present bilaterally.  Sensation is roughly equal bilaterally. Neurologic:  Normal speech and language. No gross focal neurologic deficits are appreciated.  Skin:  Skin is warm, dry and intact. No rash noted. Psychiatric: Mood and affect are normal. Speech and behavior are normal. Patient exhibits appropriate insight and judgement.   ____________________________________________   LABS (all labs ordered are listed, but only  abnormal results are displayed)  Labs Reviewed  LACTIC ACID, PLASMA - Abnormal; Notable for the following components:      Result Value   Lactic Acid, Venous 2.0 (*)    All other components within normal limits  LACTIC ACID, PLASMA - Abnormal; Notable for the following components:   Lactic Acid, Venous 4.3 (*)    All other components within normal limits  COMPREHENSIVE METABOLIC PANEL - Abnormal; Notable for the following components:   Chloride 88 (*)    CO2 35 (*)    Glucose, Bld 184 (*)    BUN 49 (*)    Creatinine, Ser 2.87 (*)    AST 44 (*)    Total Bilirubin 2.0 (*)    GFR, Estimated 18 (*)    All other components within normal limits  CBC WITH DIFFERENTIAL/PLATELET - Abnormal; Notable for the following components:   Hemoglobin 15.4 (*)    HCT 47.1 (*)    RDW 17.2 (*)    All other components within normal limits  URINALYSIS, COMPLETE (UACMP) WITH MICROSCOPIC - Abnormal; Notable for the following components:   Color, Urine YELLOW (*)    APPearance CLEAR (*)    Protein, ur 30 (*)    All other components within normal limits  CULTURE, BLOOD (ROUTINE X 2)  CULTURE, BLOOD (ROUTINE X 2)  SARS CORONAVIRUS 2 (TAT 6-24 HRS)   ____________________________________________  EKG   ____________________________________________  RADIOLOGY I personally viewed and evaluated these images as part of my medical decision making, as well as reviewing the written report by the radiologist.  ED Provider Interpretation: No evidence of  osteomyelitis on x-ray of the foot.  Ultrasound revealed no evidence of DVT.  Both x-ray and ultrasound revealed soft tissue edema  US Venous Img Lower Unilateral Right  Result Date: 11/24/2020 CLINICAL DATA:  Erythema of the right lower extremity EXAM: RIGHT LOWER EXTREMITY VENOUS DOPPLER ULTRASOUND TECHNIQUE: Gray-scale sonography with compression, as well as color and duplex ultrasound, were performed to evaluate the deep venous system(s) from the level of  the common femoral vein through the popliteal and proximal calf veins. COMPARISON:  None. FINDINGS: VENOUS Normal compressibility of the common femoral, superficial femoral, and popliteal veins, as well as the visualized calf veins. Visualized portions of profunda femoral vein and great saphenous vein unremarkable. No filling defects to suggest DVT on grayscale or color Doppler imaging. Doppler waveforms show normal direction of venous flow, normal respiratory plasticity and response to augmentation. Limited views of the contralateral common femoral vein are unremarkable. OTHER Lower extremity edema is noted. Limitations: none IMPRESSION: 1. No evidence for DVT. 2. There is nonspecific lower extremity edema. Electronically Signed   By: Constance Holster M.D.   On: 11/24/2020 19:12   DG Foot Complete Right  Result Date: 11/24/2020 CLINICAL DATA:  Diabetic foot infection. EXAM: RIGHT FOOT COMPLETE - 3+ VIEW COMPARISON:  02/24/2020 FINDINGS: There is nonspecific soft tissue swelling about the foot without evidence for an acute displaced fracture or dislocation. There is no radiopaque foreign body. There is no radiographic evidence for osteomyelitis. No definite soft tissue ulcer. IMPRESSION: Nonspecific soft tissue swelling about the foot without evidence for an acute displaced fracture or dislocation. Electronically Signed   By: Constance Holster M.D.   On: 11/24/2020 19:15    ____________________________________________    PROCEDURES  Procedure(s) performed:    Procedures    Medications  sodium chloride 0.9 % bolus 1,000 mL (0 mLs Intravenous Stopped 11/24/20 2034)  vancomycin (VANCOCIN) IVPB 1000 mg/200 mL premix (0 mg Intravenous Stopped 11/24/20 2027)     ____________________________________________   INITIAL IMPRESSION / ASSESSMENT AND PLAN / ED COURSE  Pertinent labs & imaging results that were available during my care of the patient were reviewed by me and considered in my medical  decision making (see chart for details).  Review of the [place] CSRS was performed in accordance of the [place] prior to dispensing any controlled drugs.           Patient's diagnosis is consistent with cellulitis and diabetic.  Patient presented to the emergency department with rapidly increasing erythema, edema and pain to the right foot and lower leg.  Patient is a diabetic, states that her sugars are well controlled.  She started with an area of erythema to the dorsal aspect of the foot 2 days ago.  There is no ulcerations or open wounds according to the patient.  This rapidly progressed and she saw her podiatrist today.  Podiatry was concerned for the significant increase of erythema in 2 days and sent her to the emergency department for evaluation.  Patient did have tenderness, erythema and edema extending from the foot into the calf.  Differential included diabetic foot, cellulitis, abscess, DVT, osteomyelitis.  X-ray was reassuring with no evidence of osteomyelitis.  No evidence of DVT on ultrasound.  Patient's initial labs revealed a reassuring white blood cell count but lactic was 2.0.  This rose to 4.3 after giving the patient a liter of fluids and vancomycin.  Vancomycin was chosen for good aggressive coverage of MSSA and MRSA.  Given the increase in the patient's  lactic even though vital signs and other labs are reassuring I felt that the patient would require admission for IV antibiotics.  Hospitalist service contacted for admission.  Patient care will be transferred to the hospital service at this time.     ____________________________________________  FINAL CLINICAL IMPRESSION(S) / ED DIAGNOSES  Final diagnoses:  Cellulitis in diabetic foot ([place])      NEW MEDICATIONS STARTED DURING THIS VISIT:  ED Discharge Orders    None          This chart was dictated using voice recognition software/Dragon. Despite best efforts to proofread, errors can occur which can change the  meaning. Any change was purely unintentional.    Darletta Moll, PA-C 11/24/20 2222    Nance Pear, MD 11/24/20 2240

## 2020-11-24 NOTE — H&P (Addendum)
History and Physical   Lindsay Harmon VQM:[id number] DOB: 28-Mar-1956 DOA: 11/24/2020  PCP: Lindsay Carbon, MD Outpatient Specialists: Dr. Gardiner Harmon, DPM Patient coming from: home via pov by herself  I have personally briefly reviewed patient's old medical records in [place].  Chief Concern: Lower extremity pain  HPI: Lindsay Harmon is a 65 y.o. female with medical history significant for insulin-dependent diabetes mellitus, hypertension, PAD, diabetic neuropathy, pulmonary hypertension, hyperlipidemia, presented to the emergency department for chief concerns of right lower extremity pain and redness.  She states the pain, warmth, and swelling of her right leg that started on 11/23/20. She woke up with the pain, redness, and swelling, and warmth. However, she didn't think anything of it because at baseline her legs are red. However, upon getting up out of bed, the pain worsened is achy and at it's peak it was a 5/10. It is worst with ambulation. She endorsed increased warmness. She denies numbness and tingling of the extremity. She denies bug bites. She denies trauma to the legs in the lower extremities. She did hit right leg against the heal of her boot today when she was putting on her boot. However this was after the pain, warmth, and swelling in her legs.   Social history: she lives alone. She lives next door to her mother, who is 52 years old. Patient is the primary care giver for there mother who lives next door. She quit smoking 20 years ago. She formerly smoked 2 ppds. She formerly drank etoh, daily, 4-5 shots of whiskey per day. She quit etoh 30 years ago. She denies ever using recreational drugs. She formerly worked as Medical sales representative, [employer].   Allergies: no life threatening allergies  ROS: Constitutional: no weight change, no fever ENT/Mouth: no sore throat, no rhinorrhea Eyes: no eye pain, no vision changes Cardiovascular: no chest pain, no  dyspnea,  no edema, no palpitations Respiratory: no cough, no sputum, no wheezing Gastrointestinal: no nausea, no vomiting, no diarrhea, no constipation Genitourinary: no urinary incontinence, no dysuria, no hematuria Musculoskeletal: no arthralgias, no myalgias Skin: no skin lesions, no pruritus, Neuro: + weakness, no loss of consciousness, no syncope Psych: no anxiety, no depression, + decrease appetite Heme/Lymph: no bruising, no bleeding  ED Course: Discussed with ED provider, patient requiring hospitalization due to cellulitis and was sent here by PCP for IV antibiotics.  Elevated lactic acid worsening through the course  Assessment/Plan  Principal Problem:   Cellulitis and abscess of left leg Active Problems:   Type 2 diabetes mellitus with neurological manifestations, controlled (HCC)   Essential hypertension, benign   Chronic back pain   Advance directive discussed with patient   Chronic diastolic heart failure (HCC)   Chronic respiratory failure with hypoxia (HCC)   COPD (chronic obstructive pulmonary disease) (HCC)   Cor pulmonale, chronic (HCC)   Pulmonary hypertension (HCC)   Sepsis secondary to cellulitis Cellulitis of the left lower extremity-no localized nidus however patient does have chronic bilateral skin dryness in setting of chronic vascular changes secondary to suspected PAD and diabetes -Vancomycin and cefepime per pharmacy -Blood cultures x2 -Query vascular of the lower extremity, ultrasound ABI bilateral extremity has been ordered  AKA on chronic kidney disease stage IV -Holding metolazone -Resume Lasix -BMP in a.m.  Insulin-dependent diabetes mellitus -Patient takes 13 units of Lantus daily this is been ordered -Insulin SSI with at bedtime coverage  Diabetes neuropathy-gabapentin 300 mg capsules, 1 capsule in the morning 2 capsules  in the evening  Pulmonary hypertension-resumed home Tyvaso inhalation 4 times daily  Hypertension-resumed home  metoprolol tartrate 12.5 mg twice daily  Hyperlipidemia-pravastatin 40 mg daily  Anxiety -Xanax 1.25 mg p.o. daily as needed for anxiety  Complete echo 01/04/2020 read as EF estimated 60 to 65%, mild left ventricular hypertrophy, grade 1 diastolic dysfunction  Chart reviewed.   DVT prophylaxis: Heparin Code Status: DNR Diet: Diabetic/heart healthy Family Communication: Lindsay Harmon, [phone] knows patient is here Disposition Plan: pending clinical course Consults called: None at this time, however if ultrasound ABI shows vascular insufficiency, would recommend primary a.m. team to consult vascular Admission status: Observation with telemetry  Past Medical History:  Diagnosis Date  . Allergy   . Aortic atherosclerosis ([place])   . Arthritis   . CKD (chronic kidney disease), stage IV ([place])   . COPD (chronic obstructive pulmonary disease) ([place])    a. 11/2014 PFT's: FEV1 0.87 (38%), FVC 1.41 (48%), FEF 25-75 0.41 (19%). Unable to perform DLCO; b. 12/2017 Simple spirometry ration 67%. FEV1 1.21 L+53% predicted. FVC 1.79L 61% predicted.  . Coronary artery calcification seen on CT scan    a. 03/2017 CT Chest.  . Depression   . Diabetic neuropathy ([place])   . Erythrocytosis 12/13/2019  . Esophageal stricture   . Familial hematuria   . GERD (gastroesophageal reflux disease)   . Hyperlipidemia   . Hypertension   . IBS (irritable bowel syndrome)   . Nephrolithiasis   . NIDDM (non-insulin dependent diabetes mellitus)   . Obesity, unspecified   . Pulmonary hypertension ([place])    a. WHO Group 3; b. 10/2016 Echo: EF 55-60%, no rwma, Gr1 DD, mild to mod AS, mean grad 84mHg, mildly dil RV w/ mildly reduced RV fxn, mildly dil RA. PASP 1166mg; c. 12/2017 RHC: RA 11, RV 92/12, PA 92/33(57), PCWP 15, Fick CO/CI 5.9/3.3. PVR 7.2 WU. Ao 93%, PA 62/64%.  . Marland KitchenVD (peripheral vascular disease) ([place]   Past Surgical History:  Procedure Laterality Date  . ABDOMINAL HYSTERECTOMY    . CARDIAC  CATHETERIZATION    . CARPAL TUNNEL RELEASE    . CESAREAN SECTION    . ERCP N/A 03/17/2017   Procedure: ENDOSCOPIC RETROGRADE CHOLANGIOPANCREATOGRAPHY (ERCP);  Surgeon: WoLucilla LameMD;  [facility];  Service: Endoscopy;  Laterality: N/A;  . EUS N/A 03/13/2017   Procedure: FULL UPPER ENDOSCOPIC ULTRASOUND (EUS) RADIAL;  Surgeon: Burbridge, ReMurray HodgkinsMD;  Location: [facility] ENDOSCOPY;  Service: Endoscopy;  Laterality: N/A;  . RIGHT HEART CATH N/A 01/22/2018   Procedure: RIGHT HEART CATH;  Surgeon: BeJolaine ArtistMD;  [facility];  Service: Cardiovascular;  Laterality: N/A;  . RIGHT HEART CATHETERIZATION N/A 12/05/2014   Procedure: RIGHT HEART CATH;  Surgeon: HeSinclair GroomsMD;  [facility];  Service: Cardiovascular;  Laterality: N/A;   Social History:  reports that she quit smoking about 15 years ago. Her smoking use included cigarettes. She has a 49.50 pack-year smoking history. She has never used smokeless tobacco. She reports that she does not drink alcohol and does not use drugs.  Allergies  Allergen Reactions  . Sertraline Hcl Other (See Comments)    Altered Mental Status   Family History  Problem Relation Age of Onset  . Diabetes Mother   . Cancer Mother        ovarian,melanoma  . Allergies Father   . Cancer Father        lung  . Cancer Maternal Grandmother  uterine  . Emphysema Paternal Grandfather   . Allergies Sister   . Breast cancer Neg Hx    Family history: Family history reviewed and not pertinent  Prior to Admission medications   Medication Sig Start Date End Date Taking? Authorizing Provider  albuterol (PROVENTIL) (2.5 MG/3ML) 0.083% nebulizer solution USE 1 VIAL (3ML) BY NEBULIZATION EVERY 6HOURS AS NEEDED FOR WHEEZING OR SHORTNESS OF BREATH 11/10/19   Martyn Ehrich, NP  ALPRAZolam Duanne Moron) 0.25 MG tablet Take 1 tablet (0.25 mg total) by mouth daily as needed for anxiety. 07/24/18   Lindsay Carbon, MD  aspirin  EC 81 MG tablet Take 1 tablet (81 mg total) by mouth daily. 03/23/19   Wellington Hampshire, MD  calcitRIOL (ROCALTROL) 0.5 MCG capsule Take 0.5 mcg by mouth daily.  02/03/17   [provider]  cholecalciferol (VITAMIN D) 1000 units tablet Take 1,000 Units by mouth at bedtime.     [provider]  ferrous sulfate 325 (65 FE) MG tablet Take 1 tablet by mouth daily. 08/03/20 08/03/21  [provider]  gabapentin (NEURONTIN) 300 MG capsule TAKE 1 CAPSULE BY MOUTH IN THE MORNING AND 2 CAPSULES BY MOUTH AT BEDTIME 03/10/20   Viviana Simpler I, MD  glucose blood (ONE TOUCH ULTRA TEST) test strip USE 1 STRIP TO TEST BLOOD SUGAR TWICE DAILY DUE TO LOW BLOOD SUGAR. TAKES LANTUS INSULIN Dx Code E11.51 08/10/20   Viviana Simpler I, MD  insulin glargine (LANTUS SOLOSTAR) 100 UNIT/ML Solostar Pen Inject 15 Units into the skin daily. 08/02/20   Lindsay Carbon, MD  Insulin Pen Needle (B-D UF III MINI PEN NEEDLES) 31G X 5 MM MISC USE 1 PEN NEEDLE TO INJECT INSULIN 01/11/20   Lindsay Carbon, MD  loratadine (CLARITIN) 10 MG tablet Take 10 mg by mouth 2 (two) times daily.    Tisovec, Fransico Him, MD  lovastatin (MEVACOR) 40 MG tablet Take 2 tablets (80 mg total) by mouth at bedtime. 09/16/19   Lindsay Carbon, MD  methadone (DOLOPHINE) 10 MG tablet TAKE 2 TABLETS BY MOUTH EVERY 6 HOURS ASNEEDED 11/01/20   Lindsay Carbon, MD  metolazone (ZAROXOLYN) 2.5 MG tablet TAKE 1 TABLET BY MOUTH ONCE A WEEK EVERYTUESDAY 07/06/20   Bensimhon, Shaune Pascal, MD  metoprolol tartrate (LOPRESSOR) 25 MG tablet Take 1/2 tablet by mouth twice a day 05/09/20   Lindsay Carbon, MD  mometasone (ASMANEX, 120 METERED DOSES,) 220 MCG/INH inhaler Inhale 2 puffs into the lungs 2 (two) times daily. 07/04/20   Martyn Ehrich, NP  nitroGLYCERIN (NITROSTAT) 0.4 MG SL tablet Place 1 tablet (0.4 mg total) under the tongue every 5 (five) minutes as needed for chest pain. 06/06/20   Wellington Hampshire, MD  pantoprazole (PROTONIX) 40 MG  tablet Take 1 tablet by mouth twice a day 06/22/20   Lindsay Carbon, MD  potassium chloride SA (KLOR-CON) 20 MEQ tablet Take 2 tablets (40 mEq total) by mouth once a week. Every Tuesday 03/08/20   Bensimhon, Shaune Pascal, MD  PROAIR HFA 108 [phone] Base) MCG/ACT inhaler USE 2 PUFFS EVERY 6 HOURS AS NEEDED FOR WHEEZING OR SHORTNESS OF BREATH 05/16/20   Laurin Coder, MD  Respiratory Therapy Supplies (FLUTTER) DEVI Use as directed 12/10/19   Tyler Pita, MD  STIOLTO RESPIMAT 2.5-2.5 MCG/ACT AERS INHALE 2 PUFFS BY MOUTH INTO THE LUNGS DAILY 10/02/20   Sherrilyn Rist A, MD  torsemide (DEMADEX) 20 MG tablet Take 3 tablets (60 mg total) by  mouth 2 (two) times daily. Hold afternoon dose if your weight is under 150# at home 10/10/20   Viviana Simpler I, MD  Treprostinil (TYVASO) 0.6 MG/ML SOLN Inhale 18 mcg into the lungs 4 (four) times daily.    [provider]   Physical Exam: Vitals:   11/24/20 2130 11/24/20 2145 11/24/20 2200 11/24/20 2230  BP: 121/70   (!) 142/83  Pulse: 75  78 74  Resp:      Temp:      TempSrc:      SpO2:  98% 100% 100%  Weight:      Height:       Constitutional: appears age appropriate, NAD, calm, comfortable Eyes: PERRL, lids and conjunctivae normal ENMT: Mucous membranes are moist. Posterior pharynx clear of any exudate or lesions. Dentures in place. Hearing appropriate Neck: normal, supple, no masses, no thyromegaly Respiratory: clear to auscultation bilaterally, no wheezing, no crackles. Normal respiratory effort. No accessory muscle use.  Cardiovascular: Regular rate and rhythm, no murmurs / rubs / gallops. No extremity edema. 2+ pedal pulses. No carotid bruits.  Abdomen: no tenderness, no masses palpated, no hepatosplenomegaly. Bowel sounds positive.  Musculoskeletal: no clubbing / cyanosis. No joint deformity upper and lower extremities. Good ROM, no contractures, no atrophy. Normal muscle tone.  Skin: Bilateral lower extremity redness, per patient this  is baseline.  Right greater than left.  Right lower extremity has increased warmth, edema, and redness. Neurologic: Sensation intact. Strength 5/5 in all 4.  Psychiatric: Normal judgment and insight. Alert and oriented x 3. Normal mood.   EKG: not indicated  Imaging on Admission: I personally reviewed and I agree with radiologist reading as below.  US Venous Img Lower Unilateral Right  Result Date: 11/24/2020 CLINICAL DATA:  Erythema of the right lower extremity EXAM: RIGHT LOWER EXTREMITY VENOUS DOPPLER ULTRASOUND TECHNIQUE: Gray-scale sonography with compression, as well as color and duplex ultrasound, were performed to evaluate the deep venous system(s) from the level of the common femoral vein through the popliteal and proximal calf veins. COMPARISON:  None. FINDINGS: VENOUS Normal compressibility of the common femoral, superficial femoral, and popliteal veins, as well as the visualized calf veins. Visualized portions of profunda femoral vein and great saphenous vein unremarkable. No filling defects to suggest DVT on grayscale or color Doppler imaging. Doppler waveforms show normal direction of venous flow, normal respiratory plasticity and response to augmentation. Limited views of the contralateral common femoral vein are unremarkable. OTHER Lower extremity edema is noted. Limitations: none IMPRESSION: 1. No evidence for DVT. 2. There is nonspecific lower extremity edema. Electronically Signed   By: Constance Holster M.D.   On: 11/24/2020 19:12   DG Foot Complete Right  Result Date: 11/24/2020 CLINICAL DATA:  Diabetic foot infection. EXAM: RIGHT FOOT COMPLETE - 3+ VIEW COMPARISON:  02/24/2020 FINDINGS: There is nonspecific soft tissue swelling about the foot without evidence for an acute displaced fracture or dislocation. There is no radiopaque foreign body. There is no radiographic evidence for osteomyelitis. No definite soft tissue ulcer. IMPRESSION: Nonspecific soft tissue swelling about the  foot without evidence for an acute displaced fracture or dislocation. Electronically Signed   By: Constance Holster M.D.   On: 11/24/2020 19:15   Labs on Admission: I have personally reviewed following labs  CBC: Recent Labs  Lab 11/24/20 1728  WBC 8.1  NEUTROABS 6.3  HGB 15.4*  HCT 47.1*  MCV 92.2  PLT 536   Basic Metabolic Panel: Recent Labs  Lab 11/24/20 1728  NA 138  K 3.9  CL 88*  CO2 35*  GLUCOSE 184*  BUN 49*  CREATININE 2.87*  CALCIUM 10.3   GFR: Estimated Creatinine Clearance: 17 mL/min (A) (by C-G formula based on SCr of 2.87 mg/dL (H)).  Liver Function Tests: Recent Labs  Lab 11/24/20 1728  AST 44*  ALT 35  ALKPHOS 65  BILITOT 2.0*  PROT 7.1  ALBUMIN 3.7   Urine analysis:    Component Value Date/Time   COLORURINE YELLOW (A) 11/24/2020 1728   APPEARANCEUR CLEAR (A) 11/24/2020 1728   LABSPEC 1.006 11/24/2020 1728   PHURINE 6.0 11/24/2020 1728   GLUCOSEU NEGATIVE 11/24/2020 1728   GLUCOSEU 100 (A) 03/21/2017 1120   HGBUR NEGATIVE 11/24/2020 1728   BILIRUBINUR NEGATIVE 11/24/2020 1728   KETONESUR NEGATIVE 11/24/2020 1728   PROTEINUR 30 (A) 11/24/2020 1728   UROBILINOGEN 0.2 03/21/2017 1120   NITRITE NEGATIVE 11/24/2020 1728   LEUKOCYTESUR NEGATIVE 11/24/2020 1728   Maely Clements N Maximiano Lott D.O. [facility]  If 7PM-7AM, please contact overnight-coverage provider If 7AM-7PM, please contact day coverage provider [email or web address]  11/24/2020, 11:04 PM

## 2020-11-24 NOTE — ED Notes (Signed)
Pt resting calmly in bed. This RN at bedside to collect lactic again. Pt requested to use restroom first. Pt up to toilet.

## 2020-11-24 NOTE — ED Notes (Signed)
Pt in with R foot and leg redness; R leg and foot hot. L foot cool. Dorsalis pedis pulses 1+. Cap refill on R side <3 sec. Pt can wiggle toes on R side. States leg and foot sensitive to touch. Old callus to bottom of R foot; pt states her doctor has been "working on it" for a long time and recently stated it "looked good". Legs dry and skin cracking all over. No bleeding or discharge noted.

## 2020-11-24 NOTE — ED Notes (Signed)
Pt on phone updating family. 2 tiger-top tubes sent to lab.

## 2020-11-24 NOTE — ED Notes (Signed)
JC, PA notified of lactic 2.0.

## 2020-11-24 NOTE — ED Notes (Signed)
Supplies collected. Pt on phone with family.

## 2020-11-24 NOTE — ED Triage Notes (Signed)
Pt ambulatory to triage without difficulty.  States was seen at [facility] in [place] and was told to come here for evaluation of IV abx.  Pt states infection started in last 2 days.

## 2020-11-24 NOTE — ED Notes (Signed)
Called lab about missing lactic result. Pt states "3 tubes of blood were collected".

## 2020-11-24 NOTE — ED Notes (Signed)
Asked whether provider wants blood cultures before vanc or not. Awaiting orders.

## 2020-11-25 ENCOUNTER — Observation Stay: Payer: PPO

## 2020-11-25 DIAGNOSIS — Z794 Long term (current) use of insulin: Secondary | ICD-10-CM | POA: Diagnosis not present

## 2020-11-25 DIAGNOSIS — Z9049 Acquired absence of other specified parts of digestive tract: Secondary | ICD-10-CM | POA: Diagnosis not present

## 2020-11-25 DIAGNOSIS — F419 Anxiety disorder, unspecified: Secondary | ICD-10-CM | POA: Diagnosis present

## 2020-11-25 DIAGNOSIS — L02416 Cutaneous abscess of left lower limb: Secondary | ICD-10-CM | POA: Diagnosis present

## 2020-11-25 DIAGNOSIS — Z7982 Long term (current) use of aspirin: Secondary | ICD-10-CM | POA: Diagnosis not present

## 2020-11-25 DIAGNOSIS — Z20822 Contact with and (suspected) exposure to covid-19: Secondary | ICD-10-CM | POA: Diagnosis present

## 2020-11-25 DIAGNOSIS — L03116 Cellulitis of left lower limb: Secondary | ICD-10-CM | POA: Diagnosis present

## 2020-11-25 DIAGNOSIS — Z66 Do not resuscitate: Secondary | ICD-10-CM | POA: Diagnosis present

## 2020-11-25 DIAGNOSIS — I2781 Cor pulmonale (chronic): Secondary | ICD-10-CM | POA: Diagnosis present

## 2020-11-25 DIAGNOSIS — N184 Chronic kidney disease, stage 4 (severe): Secondary | ICD-10-CM | POA: Diagnosis present

## 2020-11-25 DIAGNOSIS — E1122 Type 2 diabetes mellitus with diabetic chronic kidney disease: Secondary | ICD-10-CM | POA: Diagnosis present

## 2020-11-25 DIAGNOSIS — E11628 Type 2 diabetes mellitus with other skin complications: Secondary | ICD-10-CM | POA: Diagnosis present

## 2020-11-25 DIAGNOSIS — Z79899 Other long term (current) drug therapy: Secondary | ICD-10-CM | POA: Diagnosis not present

## 2020-11-25 DIAGNOSIS — J449 Chronic obstructive pulmonary disease, unspecified: Secondary | ICD-10-CM | POA: Diagnosis present

## 2020-11-25 DIAGNOSIS — I2729 Other secondary pulmonary hypertension: Secondary | ICD-10-CM | POA: Diagnosis present

## 2020-11-25 DIAGNOSIS — G8929 Other chronic pain: Secondary | ICD-10-CM | POA: Diagnosis present

## 2020-11-25 DIAGNOSIS — J9611 Chronic respiratory failure with hypoxia: Secondary | ICD-10-CM | POA: Diagnosis present

## 2020-11-25 DIAGNOSIS — E785 Hyperlipidemia, unspecified: Secondary | ICD-10-CM | POA: Diagnosis present

## 2020-11-25 DIAGNOSIS — K219 Gastro-esophageal reflux disease without esophagitis: Secondary | ICD-10-CM | POA: Diagnosis present

## 2020-11-25 DIAGNOSIS — I739 Peripheral vascular disease, unspecified: Secondary | ICD-10-CM | POA: Diagnosis not present

## 2020-11-25 DIAGNOSIS — I130 Hypertensive heart and chronic kidney disease with heart failure and stage 1 through stage 4 chronic kidney disease, or unspecified chronic kidney disease: Secondary | ICD-10-CM | POA: Diagnosis present

## 2020-11-25 DIAGNOSIS — M1990 Unspecified osteoarthritis, unspecified site: Secondary | ICD-10-CM | POA: Diagnosis present

## 2020-11-25 DIAGNOSIS — I5032 Chronic diastolic (congestive) heart failure: Secondary | ICD-10-CM | POA: Diagnosis present

## 2020-11-25 DIAGNOSIS — L03119 Cellulitis of unspecified part of limb: Secondary | ICD-10-CM | POA: Diagnosis present

## 2020-11-25 DIAGNOSIS — M549 Dorsalgia, unspecified: Secondary | ICD-10-CM | POA: Diagnosis present

## 2020-11-25 DIAGNOSIS — A419 Sepsis, unspecified organism: Secondary | ICD-10-CM | POA: Diagnosis present

## 2020-11-25 LAB — COMPREHENSIVE METABOLIC PANEL
ALT: 30 U/L (ref 0–44)
AST: 42 U/L — ABNORMAL HIGH (ref 15–41)
Albumin: 3.1 g/dL — ABNORMAL LOW (ref 3.5–5.0)
Alkaline Phosphatase: 58 U/L (ref 38–126)
Anion gap: 12 (ref 5–15)
BUN: 43 mg/dL — ABNORMAL HIGH (ref 8–23)
CO2: 34 mmol/L — ABNORMAL HIGH (ref 22–32)
Calcium: 9.6 mg/dL (ref 8.9–10.3)
Chloride: 92 mmol/L — ABNORMAL LOW (ref 98–111)
Creatinine, Ser: 2.61 mg/dL — ABNORMAL HIGH (ref 0.44–1.00)
GFR, Estimated: 20 mL/min — ABNORMAL LOW (ref >60)
Glucose, Bld: 142 mg/dL — ABNORMAL HIGH (ref 70–99)
Potassium: 3.8 mmol/L (ref 3.5–5.1)
Sodium: 138 mmol/L (ref 135–145)
Total Bilirubin: 1.5 mg/dL — ABNORMAL HIGH (ref 0.3–1.2)
Total Protein: 6.1 g/dL — ABNORMAL LOW (ref 6.5–8.1)

## 2020-11-25 LAB — MRSA PCR SCREENING: MRSA by PCR: NEGATIVE

## 2020-11-25 LAB — APTT: aPTT: 45 seconds — ABNORMAL HIGH (ref 24–36)

## 2020-11-25 LAB — CBC
HCT: 42.7 % (ref 36.0–46.0)
Hemoglobin: 13.5 g/dL (ref 12.0–15.0)
MCH: 29.8 pg (ref 26.0–34.0)
MCHC: 31.6 g/dL (ref 30.0–36.0)
MCV: 94.3 fL (ref 80.0–100.0)
Platelets: 146 10*3/uL — ABNORMAL LOW (ref 150–400)
RBC: 4.53 MIL/uL (ref 3.87–5.11)
RDW: 16.8 % — ABNORMAL HIGH (ref 11.5–15.5)
WBC: 8 10*3/uL (ref 4.0–10.5)
nRBC: 0 % (ref 0.0–0.2)

## 2020-11-25 LAB — SARS CORONAVIRUS 2 (TAT 6-24 HRS): SARS Coronavirus 2: NEGATIVE

## 2020-11-25 LAB — HIV ANTIBODY (ROUTINE TESTING W REFLEX): HIV Screen 4th Generation wRfx: NONREACTIVE

## 2020-11-25 LAB — CBG MONITORING, ED
Glucose-Capillary: 163 mg/dL — ABNORMAL HIGH (ref 70–99)
Glucose-Capillary: 139 mg/dL — ABNORMAL HIGH (ref 70–99)
Glucose-Capillary: 162 mg/dL — ABNORMAL HIGH (ref 70–99)
Glucose-Capillary: 92 mg/dL (ref 70–99)
Glucose-Capillary: 161 mg/dL — ABNORMAL HIGH (ref 70–99)

## 2020-11-25 LAB — PROTIME-INR
INR: 1.6 — ABNORMAL HIGH (ref 0.8–1.2)
Prothrombin Time: 18.4 seconds — ABNORMAL HIGH (ref 11.4–15.2)

## 2020-11-25 LAB — TSH: TSH: 8.149 u[IU]/mL — ABNORMAL HIGH (ref 0.350–4.500)

## 2020-11-25 LAB — LACTIC ACID, PLASMA
Lactic Acid, Venous: 2.9 mmol/L (ref 0.5–1.9)
Lactic Acid, Venous: 2.2 mmol/L (ref 0.5–1.9)
Lactic Acid, Venous: 2.3 mmol/L (ref 0.5–1.9)

## 2020-11-25 LAB — HEMOGLOBIN A1C
Hgb A1c MFr Bld: 7.4 % — ABNORMAL HIGH (ref 4.8–5.6)
Mean Plasma Glucose: 165.68 mg/dL

## 2020-11-25 MED ORDER — VANCOMYCIN HCL 500 MG/100ML IV SOLN
500.0000 mg | INTRAVENOUS | Status: DC
  Administered 2020-11-25: 500 mg via INTRAVENOUS
  Filled 2020-11-25 (×2): qty 100

## 2020-11-25 MED ORDER — FLUTICASONE PROPIONATE HFA 220 MCG/ACT IN AERO: 2.0000 | INHALATION_SPRAY | Freq: Two times a day (BID) | RESPIRATORY_TRACT | Status: DC

## 2020-11-25 MED ORDER — BUDESONIDE 0.5 MG/2ML IN SUSP
1.0000 mg | Freq: Two times a day (BID) | RESPIRATORY_TRACT | Status: DC
  Administered 2020-11-25 – 2020-11-28 (×6): 1 mg via RESPIRATORY_TRACT
  Filled 2020-11-25 (×7): qty 4

## 2020-11-25 MED ORDER — SODIUM CHLORIDE 0.9 % IV SOLN
2.0000 g | INTRAVENOUS | Status: DC
  Administered 2020-11-26: 2 g via INTRAVENOUS
  Filled 2020-11-25: qty 2

## 2020-11-25 MED ORDER — ARFORMOTEROL TARTRATE 15 MCG/2ML IN NEBU
15.0000 ug | INHALATION_SOLUTION | Freq: Two times a day (BID) | RESPIRATORY_TRACT | Status: DC
  Administered 2020-11-25 – 2020-11-28 (×5): 15 ug via RESPIRATORY_TRACT
  Filled 2020-11-25 (×9): qty 2

## 2020-11-25 MED ORDER — GABAPENTIN 300 MG PO CAPS
300.0000 mg | ORAL_CAPSULE | Freq: Every morning | ORAL | Status: DC
  Administered 2020-11-25 – 2020-11-28 (×4): 300 mg via ORAL
  Filled 2020-11-25 (×4): qty 1

## 2020-11-25 MED ORDER — GABAPENTIN 300 MG PO CAPS
600.0000 mg | ORAL_CAPSULE | Freq: Every day | ORAL | Status: DC
  Administered 2020-11-25 – 2020-11-27 (×4): 600 mg via ORAL
  Filled 2020-11-25 (×4): qty 2

## 2020-11-25 MED ORDER — UMECLIDINIUM BROMIDE 62.5 MCG/INH IN AEPB
1.0000 | INHALATION_SPRAY | Freq: Every day | RESPIRATORY_TRACT | Status: DC
  Administered 2020-11-25 – 2020-11-28 (×4): 1 via RESPIRATORY_TRACT
  Filled 2020-11-25: qty 7

## 2020-11-25 NOTE — Progress Notes (Signed)
PHARMACIST - PHYSICIAN COMMUNICATION  CONCERNING: Stiolto Respimat PTA Med Consult  Plan: Ordered Arformoterol (Brovana) Neb 15 mcg BID + Umeclidinium (Incruse) inhaler 1 puff daily per P&T Therapeutic Substitution Protocol.  Renda Rolls, PharmD, [facility] 11/25/2020 3:09 AM

## 2020-11-25 NOTE — ED Notes (Addendum)
Patient repositioned with pillow under right shoulder.

## 2020-11-25 NOTE — ED Notes (Signed)
Patient given diet ginger ale per request.

## 2020-11-25 NOTE — ED Notes (Signed)
Pt in US at this time 

## 2020-11-25 NOTE — ED Notes (Signed)
Took over care of pt. Pt resting comfortably. Pt given diet ginger ale. Pt in NAD at this time. VSS. Awaiting further orders. Will continue to monitor.

## 2020-11-25 NOTE — ED Notes (Addendum)
Pt returned from [place]. BP And pulse ox monitor replaced. POC discussed with patient. Call bell in reach.

## 2020-11-25 NOTE — Progress Notes (Signed)
Pharmacy Antibiotic Note  Lindsay Harmon is a 65 y.o. female c/o lower extremity pain and redness admitted on 11/24/2020 with cellulitis w/ abscess of left leg.  Pharmacy has been consulted for Cefepime and Vancomycin dosing.  Med hx: DM w/ neuropathy, HTN, PAD, Pulm HTN, HL. Afebrile, LA 4.3, WBC 8.1.  Plan: Ordered Cefepime 2 gm q24h per indication and renal fxn.  Pt given 1 gm Vanc in ER 01/28 @ 1927 Vancomycin 500 mg IV Q 24 hrs. Goal AUC 400-550. Expected AUC: 473.2 SCr used: 2.87 (Slightly elevated above baseline)  Pharmacy will continue to monitor for lab cx results. Pharmacy will continue to follow renal fxn and adjust abx doses as appropriate.   Height: _0  (154.9 cm) Weight: 68 kg (150 lb) IBW/kg (Calculated) : 47.8  Temp (24hrs), Avg:98.8 F (37.1 C), Min:98.8 F (37.1 C), Max:98.8 F (37.1 C)  Recent Labs  Lab 11/24/20 1728 11/24/20 1729 11/24/20 2049 11/25/20 0243  WBC 8.1  --   --   --   CREATININE 2.87*  --   --   --   LATICACIDVEN  --  2.0* 4.3* 2.9*    Estimated Creatinine Clearance: 17 mL/min (A) (by C-G formula based on SCr of 2.87 mg/dL (H)).    Allergies  Allergen Reactions  . Sertraline Hcl Other (See Comments)    Altered Mental Status    Antimicrobials this admission: 01/28 Vanc >>  01/29 Cefepime >>    Microbiology results: 01/28 BCx: Pending  Thank you for allowing pharmacy to be a part of this patient's care.  Renda Rolls, PharmD, [facility] 11/25/2020 3:43 AM

## 2020-11-25 NOTE — Progress Notes (Signed)
PROGRESS NOTE    Lindsay Harmon  GUR:[id number] DOB: [birth year] DOA: 11/24/2020 PCP: Venia Carbon, MD    Brief Narrative:  65 y.o. female with medical history significant for insulin-dependent diabetes mellitus, hypertension, PAD, diabetic neuropathy, pulmonary hypertension, hyperlipidemia, presented to the emergency department for chief concerns of right lower extremity pain and redness.  She states the pain, warmth, and swelling of her right leg that started on 11/23/20. She woke up with the pain, redness, and swelling, and warmth. However, she didn't think anything of it because at baseline her legs are red. However, upon getting up out of bed, the pain worsened is achy and at it's peak it was a 5/10. It is worst with ambulation. She endorsed increased warmness. She denies numbness and tingling of the extremity. She denies bug bites. She denies trauma to the legs in the lower extremities. She did hit right leg against the heal of her boot today when she was putting on her boot. However this was after the pain, warmth, and swelling in her legs.   Assessment & Plan:   Principal Problem:   Cellulitis and abscess of left leg Active Problems:   Type 2 diabetes mellitus with neurological manifestations, controlled (HCC)   Essential hypertension, benign   Chronic back pain   Advance directive discussed with patient   Chronic diastolic heart failure (HCC)   Chronic respiratory failure with hypoxia (HCC)   COPD (chronic obstructive pulmonary disease) (HCC)   Cor pulmonale, chronic (HCC)   Pulmonary hypertension (HCC)  Cellulitis of left lower extremity Sepsis secondary to above Suspect secondary to chronic venous insufficiency and underlying PVD Started on broad-spectrum antibiotics on admission ABI bilateral lower extremity ordered, pending Plan: Continue broad-spectrum antibiotic coverage for today, can likely de-escalate tomorrow Intravenous fluids Pain control as  needed Follow-up ABI Follow cultures Monitor labs and fever curve  AKI on CKD stage IV Holding metolazone Resume home Lasix K function slowly improving Daily BMP  Insulin-dependent diabetes mellitus Diabetic neuropathy Continue basal bolus regimen 13 units Lantus daily Moderate sliding scale bedtime coverage Continue home gabapentin  Pulmonary hypertension Resume home to have also  Essential hypertension Resume Lopressor 12.5 mg p.o. twice daily  Hyperlipidemia Continue pravastatin 40 mg daily  Anxiety Xanax as needed per home dose     DVT prophylaxis: SQ heparin Code Status: DNR Family Communication: None today Disposition Plan: Status is: Observation  The patient will require care spanning > 2 midnights and should be moved to inpatient because: IV treatments appropriate due to intensity of illness or inability to take PO and Inpatient level of care appropriate due to severity of illness  Dispo: The patient is from: Home              Anticipated d/c is to: Home              Anticipated d/c date is: 2 days              Patient currently is not medically stable to d/c.   Difficult to place patient No  Lower extremity cellulitis.  Currently on IV antibiotics.  Anticipate 48 hours prior to disposition planning.  Anticipate discharge back home.     Level of care: Med-Surg  Consultants:   None  Procedures:   None  Antimicrobials:   Vancomycin  Cefepime   Subjective: Patient seen and examined.  Reports slight improvement in pain in lower extremity.  No other issues.  Objective: Vitals:   11/25/20 0110 11/25/20  0500 11/25/20 0904 11/25/20 1021  BP: 136/84 127/79 125/80 (!) 125/57  Pulse: 74 74 75 70  Resp: _0 Temp:    98.2 F (36.8 C)  TempSrc:    Oral  SpO2: 98% 100% 100% 91%  Weight:      Height:       No intake or output data in the 24 hours ending 11/25/20 1319 Filed Weights   11/24/20 1726  Weight: 68 kg     Examination:  General exam: Appears calm and comfortable  Respiratory system: Clear to auscultation. Respiratory effort normal. Cardiovascular system: S1 & S2 heard, RRR. No JVD, murmurs, rubs, gallops or clicks. No pedal edema. Gastrointestinal system: Abdomen is nondistended, soft and nontender. No organomegaly or masses felt. Normal bowel sounds heard. Central nervous system: Alert and oriented. No focal neurological deficits. Extremities: BL LE redness, RLE swollen, tender to touch, increased warmth Skin: No rashes, lesions or ulcers Psychiatry: Judgement and insight appear normal. Mood & affect appropriate.     Data Reviewed: I have personally reviewed following labs and imaging studies  CBC: Recent Labs  Lab 11/24/20 1728 11/25/20 0346  WBC 8.1 8.0  NEUTROABS 6.3  --   HGB 15.4* 13.5  HCT 47.1* 42.7  MCV 92.2 94.3  PLT 174 637*   Basic Metabolic Panel: Recent Labs  Lab 11/24/20 1728 11/25/20 0346  NA 138 138  K 3.9 3.8  CL 88* 92*  CO2 35* 34*  GLUCOSE 184* 142*  BUN 49* 43*  CREATININE 2.87* 2.61*  CALCIUM 10.3 9.6   GFR: Estimated Creatinine Clearance: 18.7 mL/min (A) (by C-G formula based on SCr of 2.61 mg/dL (H)). Liver Function Tests: Recent Labs  Lab 11/24/20 1728 11/25/20 0346  AST 44* 42*  ALT 35 30  ALKPHOS 65 58  BILITOT 2.0* 1.5*  PROT 7.1 6.1*  ALBUMIN 3.7 3.1*   No results for input(s): LIPASE, AMYLASE in the last 168 hours. No results for input(s): AMMONIA in the last 168 hours. Coagulation Profile: Recent Labs  Lab 11/25/20 0346  INR 1.6*   Cardiac Enzymes: No results for input(s): CKTOTAL, CKMB, CKMBINDEX, TROPONINI in the last 168 hours. BNP (last 3 results) No results for input(s): PROBNP in the last 8760 hours. HbA1C: Recent Labs    11/25/20 0243  HGBA1C 7.4*   CBG: Recent Labs  Lab 11/25/20 0106 11/25/20 0824 11/25/20 1214  GLUCAP 163* 139* 162*   Lipid Profile: No results for input(s): CHOL, HDL,  LDLCALC, TRIG, CHOLHDL, LDLDIRECT in the last 72 hours. Thyroid Function Tests: Recent Labs    11/25/20 0243  TSH 8.149*   Anemia Panel: No results for input(s): VITAMINB12, FOLATE, FERRITIN, TIBC, IRON, RETICCTPCT in the last 72 hours. Sepsis Labs: Recent Labs  Lab 11/24/20 1729 11/24/20 2049 11/25/20 0243  LATICACIDVEN 2.0* 4.3* 2.9*    Recent Results (from the past 240 hour(s))  Culture, blood (routine x 2)     Status: None (Preliminary result)   Collection Time: 11/24/20  6:53 PM   Specimen: BLOOD  Result Value Ref Range Status   Specimen Description BLOOD BLOOD LEFT FOREARM  Final   Special Requests   Final    BOTTLES DRAWN AEROBIC AND ANAEROBIC Blood Culture adequate volume   Culture   Final    NO GROWTH < 12 HOURS Performed at [facility], [address] [place], [place] [address]    Report Status PENDING  Incomplete  Culture, blood (routine x 2)  Status: None (Preliminary result)   Collection Time: 11/24/20  6:58 PM   Specimen: BLOOD  Result Value Ref Range Status   Specimen Description BLOOD BLOOD RIGHT FOREARM  Final   Special Requests   Final    BOTTLES DRAWN AEROBIC AND ANAEROBIC Blood Culture adequate volume   Culture   Final    NO GROWTH < 12 HOURS Performed at [facility], [place], [place] [address]    Report Status PENDING  Incomplete  SARS CORONAVIRUS 2 (TAT 6-24 HRS) Nasopharyngeal Nasopharyngeal Swab     Status: None   Collection Time: 11/24/20 10:00 PM   Specimen: Nasopharyngeal Swab  Result Value Ref Range Status   SARS Coronavirus 2 NEGATIVE NEGATIVE Final    Comment: (NOTE) SARS-CoV-2 target nucleic acids are NOT DETECTED.  The SARS-CoV-2 RNA is generally detectable in upper and lower respiratory specimens during the acute phase of infection. Negative results do not preclude SARS-CoV-2 infection, do not rule out co-infections with other pathogens, and should not be used as the sole basis for  treatment or other patient management decisions. Negative results must be combined with clinical observations, patient history, and epidemiological information. The expected result is Negative.  Fact Sheet for Patients: SugarRoll.be  Fact Sheet for Healthcare Providers: https://www.woods-mathews.com/  This test is not yet approved or cleared by the [place] FDA and  has been authorized for detection and/or diagnosis of SARS-CoV-2 by FDA under an Emergency Use Authorization (EUA). This EUA will remain  in effect (meaning this test can be used) for the duration of the COVID-19 declaration under Se ction 564(b)(1) of the Act, 21 U.S.C. section 360bbb-3(b)(1), unless the authorization is terminated or revoked sooner.  Performed at [facility], [place] [address] [place], [place] [address]   MRSA PCR Screening     Status: None   Collection Time: 11/25/20  3:46 AM   Specimen: Nasopharyngeal  Result Value Ref Range Status   MRSA by PCR NEGATIVE NEGATIVE Final    Comment:        The GeneXpert MRSA Assay (FDA approved for NASAL specimens only), is one component of a comprehensive MRSA colonization surveillance program. It is not intended to diagnose MRSA infection nor to guide or monitor treatment for MRSA infections. Performed at [facility], [address] [place], [place] [address]          Radiology Studies: US Venous Img Lower Unilateral Right  Result Date: 11/24/2020 CLINICAL DATA:  Erythema of the right lower extremity EXAM: RIGHT LOWER EXTREMITY VENOUS DOPPLER ULTRASOUND TECHNIQUE: Gray-scale sonography with compression, as well as color and duplex ultrasound, were performed to evaluate the deep venous system(s) from the level of the common femoral vein through the popliteal and proximal calf veins. COMPARISON:  None. FINDINGS: VENOUS Normal compressibility of the common femoral, superficial femoral, and  popliteal veins, as well as the visualized calf veins. Visualized portions of profunda femoral vein and great saphenous vein unremarkable. No filling defects to suggest DVT on grayscale or color Doppler imaging. Doppler waveforms show normal direction of venous flow, normal respiratory plasticity and response to augmentation. Limited views of the contralateral common femoral vein are unremarkable. OTHER Lower extremity edema is noted. Limitations: none IMPRESSION: 1. No evidence for DVT. 2. There is nonspecific lower extremity edema. Electronically Signed   By: Constance Holster M.D.   On: 11/24/2020 19:12   US ARTERIAL ABI (SCREENING LOWER EXTREMITY)  Result Date: 11/25/2020 CLINICAL DATA:  Peripheral artery  disease EXAM: NONINVASIVE PHYSIOLOGIC VASCULAR STUDY OF BILATERAL LOWER EXTREMITIES TECHNIQUE: Evaluation of both lower extremities were performed at rest, including calculation of ankle-brachial indices with single level Doppler, pressure and pulse volume recording. COMPARISON:  None. FINDINGS: Right ABI:  0.5 Left ABI:  0.61 Right Lower Extremity:  Monophasic waveforms Left Lower Extremity:  Monophasic waveforms IMPRESSION: Decreased ABIs bilateral consistent with moderate arterial disease. Monophasic waveforms are noted at the ankles bilaterally. Electronically Signed   By: [name] M.D.   On: 11/25/2020 02:22   DG Foot Complete Right  Result Date: 11/24/2020 CLINICAL DATA:  Diabetic foot infection. EXAM: RIGHT FOOT COMPLETE - 3+ VIEW COMPARISON:  02/24/2020 FINDINGS: There is nonspecific soft tissue swelling about the foot without evidence for an acute displaced fracture or dislocation. There is no radiopaque foreign body. There is no radiographic evidence for osteomyelitis. No definite soft tissue ulcer. IMPRESSION: Nonspecific soft tissue swelling about the foot without evidence for an acute displaced fracture or dislocation. Electronically Signed   By: Constance Holster M.D.   On:  11/24/2020 19:15        Scheduled Meds: . arformoterol  15 mcg Nebulization BID   And  . umeclidinium bromide  1 puff Inhalation Daily  . aspirin EC  81 mg Oral Daily  . budesonide (PULMICORT) nebulizer solution  1 mg Nebulization BID  . calcitRIOL  0.5 mcg Oral Daily  . cholecalciferol  1,000 Units Oral QHS  . gabapentin  300 mg Oral q morning - 10a   And  . gabapentin  600 mg Oral QHS  . heparin  5,000 Units Subcutaneous Q8H  . insulin aspart  0-5 Units Subcutaneous QHS  . insulin aspart  0-9 Units Subcutaneous TID WC  . insulin glargine  13 Units Subcutaneous Daily  . metoprolol tartrate  12.5 mg Oral BID  . pantoprazole  40 mg Oral BID  . pravastatin  40 mg Oral q1800  . torsemide  60 mg Oral BID  . Treprostinil  18 mcg Inhalation QID   Continuous Infusions: . [START ON 11/26/2020] ceFEPime (MAXIPIME) IV    . lactated ringers 125 mL/hr at 11/25/20 1123  . vancomycin       LOS: 0 days    Time spent: 25 minutes    Sidney Ace, MD [facility] Pager [phone]  If 7PM-7AM, please contact night-coverage 11/25/2020, 1:19 PM

## 2020-11-25 NOTE — ED Notes (Signed)
Pt given meal tray at this time 

## 2020-11-26 LAB — BASIC METABOLIC PANEL
Anion gap: 12 (ref 5–15)
BUN: 41 mg/dL — ABNORMAL HIGH (ref 8–23)
CO2: 36 mmol/L — ABNORMAL HIGH (ref 22–32)
Calcium: 9.7 mg/dL (ref 8.9–10.3)
Chloride: 90 mmol/L — ABNORMAL LOW (ref 98–111)
Creatinine, Ser: 2.2 mg/dL — ABNORMAL HIGH (ref 0.44–1.00)
GFR, Estimated: 24 mL/min — ABNORMAL LOW (ref >60)
Glucose, Bld: 173 mg/dL — ABNORMAL HIGH (ref 70–99)
Potassium: 3.4 mmol/L — ABNORMAL LOW (ref 3.5–5.1)
Sodium: 138 mmol/L (ref 135–145)

## 2020-11-26 LAB — CBC WITH DIFFERENTIAL/PLATELET
Abs Immature Granulocytes: 0.02 10*3/uL (ref 0.00–0.07)
Basophils Absolute: 0 10*3/uL (ref 0.0–0.1)
Basophils Relative: 1 %
Eosinophils Absolute: 0.1 10*3/uL (ref 0.0–0.5)
Eosinophils Relative: 2 %
HCT: 43.6 % (ref 36.0–46.0)
Hemoglobin: 14.2 g/dL (ref 12.0–15.0)
Immature Granulocytes: 0 %
Lymphocytes Relative: 16 %
Lymphs Abs: 1 10*3/uL (ref 0.7–4.0)
MCH: 29.9 pg (ref 26.0–34.0)
MCHC: 32.6 g/dL (ref 30.0–36.0)
MCV: 91.8 fL (ref 80.0–100.0)
Monocytes Absolute: 0.5 10*3/uL (ref 0.1–1.0)
Monocytes Relative: 8 %
Neutro Abs: 4.8 10*3/uL (ref 1.7–7.7)
Neutrophils Relative %: 73 %
Platelets: 159 10*3/uL (ref 150–400)
RBC: 4.75 MIL/uL (ref 3.87–5.11)
RDW: 16.8 % — ABNORMAL HIGH (ref 11.5–15.5)
WBC: 6.5 10*3/uL (ref 4.0–10.5)
nRBC: 0 % (ref 0.0–0.2)

## 2020-11-26 LAB — CBG MONITORING, ED
Glucose-Capillary: 108 mg/dL — ABNORMAL HIGH (ref 70–99)
Glucose-Capillary: 170 mg/dL — ABNORMAL HIGH (ref 70–99)
Glucose-Capillary: 124 mg/dL — ABNORMAL HIGH (ref 70–99)
Glucose-Capillary: 141 mg/dL — ABNORMAL HIGH (ref 70–99)

## 2020-11-26 MED ORDER — CEFAZOLIN SODIUM-DEXTROSE 2-4 GM/100ML-% IV SOLN
2.0000 g | Freq: Two times a day (BID) | INTRAVENOUS | Status: DC
  Administered 2020-11-26 – 2020-11-28 (×5): 2 g via INTRAVENOUS
  Filled 2020-11-26 (×7): qty 100

## 2020-11-26 NOTE — Progress Notes (Signed)
PROGRESS NOTE    Lindsay Harmon  JOA:[id number] DOB: [birth year] DOA: 11/24/2020 PCP: Venia Carbon, MD    Brief Narrative:  65 y.o. female with medical history significant for insulin-dependent diabetes mellitus, hypertension, PAD, diabetic neuropathy, pulmonary hypertension, hyperlipidemia, presented to the emergency department for chief concerns of right lower extremity pain and redness.  She states the pain, warmth, and swelling of her right leg that started on 11/23/20. She woke up with the pain, redness, and swelling, and warmth. However, she didn't think anything of it because at baseline her legs are red. However, upon getting up out of bed, the pain worsened is achy and at it's peak it was a 5/10. It is worst with ambulation. She endorsed increased warmness. She denies numbness and tingling of the extremity. She denies bug bites. She denies trauma to the legs in the lower extremities. She did hit right leg against the heal of her boot today when she was putting on her boot. However this was after the pain, warmth, and swelling in her legs.  Patient was started on broad-spectrum IV antibiotics after cultures drawn in the emergency room.  Her lower extremity cellulitis appears to be improving.   Assessment & Plan:   Principal Problem:   Cellulitis and abscess of left leg Active Problems:   Type 2 diabetes mellitus with neurological manifestations, controlled (HCC)   Essential hypertension, benign   Chronic back pain   Advance directive discussed with patient   Chronic diastolic heart failure (HCC)   Chronic respiratory failure with hypoxia (HCC)   COPD (chronic obstructive pulmonary disease) (HCC)   Cor pulmonale, chronic (HCC)   Pulmonary hypertension (HCC)   Lower extremity cellulitis  Cellulitis of left lower extremity Sepsis secondary to above Suspect secondary to chronic venous insufficiency and underlying PVD Started on broad-spectrum antibiotics on admission ABI  bilateral lower extremity ordered, moderate PAD noted Plan: DC IV vancomycin and cefepime Start Ancef, pharmacy dosing Discontinue intravenous fluids Pain control as needed Follow cultures Monitor labs and fever curve Therapy evaluations  AKI on CKD stage IV Holding metolazone Resumed home torsemide Kidney function slowly improving Daily BMP  Insulin-dependent diabetes mellitus Diabetic neuropathy Continue basal bolus regimen 13 units Lantus daily Moderate sliding scale 3 times daily with meals bedtime coverage Continue home gabapentin  Pulmonary hypertension Resume home treprostinil nebulizer   Essential hypertension Resume Lopressor 12.5 mg p.o. twice daily  Hyperlipidemia Continue pravastatin 40 mg daily  Anxiety Xanax as needed per home dose     DVT prophylaxis: SQ heparin Code Status: DNR Family Communication: None today Disposition Plan: Status is: Inpatient  Remains inpatient appropriate because:Inpatient level of care appropriate due to severity of illness   Dispo: The patient is from: Home              Anticipated d/c is to: Home              Anticipated d/c date is: 2 days              Patient currently is not medically stable to d/c.   Difficult to place patient No   Remains on IV antibiotics.  Has been escalated to Ancef given nonpurulent cellulitis.  Patient hemodynamically stable on exam slowly improving.  Hopefully can be discharged within 48 hours.  Therapy evaluations requested.           Level of care: Med-Surg  Consultants:   None  Procedures:   None  Antimicrobials:  Cefazolin  Subjective: Patient seen and examined.  Slowly improving.  Improved pain in lower extremities.  No other issues.  Objective: Vitals:   11/26/20 0200 11/26/20 0400 11/26/20 0600 11/26/20 1102  BP: 94/62 112/71 126/75 119/61  Pulse: 80 90 82 80  Resp: _0 Temp:    98.1 F (36.7 C)  TempSrc:    Oral  SpO2: 90% 93% 97% 97%   Weight:      Height:        Intake/Output Summary (Last 24 hours) at 11/26/2020 1307 Last data filed at 11/26/2020 0139 Gross per 24 hour  Intake 200 ml  Output 1500 ml  Net -1300 ml   Filed Weights   11/24/20 1726  Weight: 68 kg    Examination:  General exam: Appears calm and comfortable  Respiratory system: Clear to auscultation. Respiratory effort normal. Cardiovascular system: S1 & S2 heard, RRR. No JVD, murmurs, rubs, gallops or clicks. No pedal edema. Gastrointestinal system: Abdomen is nondistended, soft and nontender. No organomegaly or masses felt. Normal bowel sounds heard. Central nervous system: Alert and oriented. No focal neurological deficits. Extremities: BL LE redness, RLE swollen, tender to touch, increased warmth Skin: No rashes, lesions or ulcers Psychiatry: Judgement and insight appear normal. Mood & affect appropriate.     Data Reviewed: I have personally reviewed following labs and imaging studies  CBC: Recent Labs  Lab 11/24/20 1728 11/25/20 0346 11/26/20 0457  WBC 8.1 8.0 6.5  NEUTROABS 6.3  --  4.8  HGB 15.4* 13.5 14.2  HCT 47.1* 42.7 43.6  MCV 92.2 94.3 91.8  PLT 174 146* 045   Basic Metabolic Panel: Recent Labs  Lab 11/24/20 1728 11/25/20 0346 11/26/20 0457  NA 138 138 138  K 3.9 3.8 3.4*  CL 88* 92* 90*  CO2 35* 34* 36*  GLUCOSE 184* 142* 173*  BUN 49* 43* 41*  CREATININE 2.87* 2.61* 2.20*  CALCIUM 10.3 9.6 9.7   GFR: Estimated Creatinine Clearance: 22.2 mL/min (A) (by C-G formula based on SCr of 2.2 mg/dL (H)). Liver Function Tests: Recent Labs  Lab 11/24/20 1728 11/25/20 0346  AST 44* 42*  ALT 35 30  ALKPHOS 65 58  BILITOT 2.0* 1.5*  PROT 7.1 6.1*  ALBUMIN 3.7 3.1*   No results for input(s): LIPASE, AMYLASE in the last 168 hours. No results for input(s): AMMONIA in the last 168 hours. Coagulation Profile: Recent Labs  Lab 11/25/20 0346  INR 1.6*   Cardiac Enzymes: No results for input(s): CKTOTAL, CKMB,  CKMBINDEX, TROPONINI in the last 168 hours. BNP (last 3 results) No results for input(s): PROBNP in the last 8760 hours. HbA1C: Recent Labs    11/25/20 0243  HGBA1C 7.4*   CBG: Recent Labs  Lab 11/25/20 1214 11/25/20 1643 11/25/20 2129 11/26/20 0818 11/26/20 1205  GLUCAP 162* 92 161* 108* 170*   Lipid Profile: No results for input(s): CHOL, HDL, LDLCALC, TRIG, CHOLHDL, LDLDIRECT in the last 72 hours. Thyroid Function Tests: Recent Labs    11/25/20 0243  TSH 8.149*   Anemia Panel: No results for input(s): VITAMINB12, FOLATE, FERRITIN, TIBC, IRON, RETICCTPCT in the last 72 hours. Sepsis Labs: Recent Labs  Lab 11/24/20 2049 11/25/20 0243 11/25/20 1339 11/25/20 1633  LATICACIDVEN 4.3* 2.9* 2.2* 2.3*    Recent Results (from the past 240 hour(s))  Culture, blood (routine x 2)     Status: None (Preliminary result)   Collection Time: 11/24/20  6:53 PM   Specimen: BLOOD  Result Value Ref Range  Status   Specimen Description BLOOD BLOOD LEFT FOREARM  Final   Special Requests   Final    BOTTLES DRAWN AEROBIC AND ANAEROBIC Blood Culture adequate volume   Culture   Final    NO GROWTH 2 DAYS Performed at [facility], [address] [place], [place] [address]    Report Status PENDING  Incomplete  Culture, blood (routine x 2)     Status: None (Preliminary result)   Collection Time: 11/24/20  6:58 PM   Specimen: BLOOD  Result Value Ref Range Status   Specimen Description BLOOD BLOOD RIGHT FOREARM  Final   Special Requests   Final    BOTTLES DRAWN AEROBIC AND ANAEROBIC Blood Culture adequate volume   Culture   Final    NO GROWTH 2 DAYS Performed at [facility], [place], [place] [address]    Report Status PENDING  Incomplete  SARS CORONAVIRUS 2 (TAT 6-24 HRS) Nasopharyngeal Nasopharyngeal Swab     Status: None   Collection Time: 11/24/20 10:00 PM   Specimen: Nasopharyngeal Swab  Result Value Ref Range Status   SARS Coronavirus 2  NEGATIVE NEGATIVE Final    Comment: (NOTE) SARS-CoV-2 target nucleic acids are NOT DETECTED.  The SARS-CoV-2 RNA is generally detectable in upper and lower respiratory specimens during the acute phase of infection. Negative results do not preclude SARS-CoV-2 infection, do not rule out co-infections with other pathogens, and should not be used as the sole basis for treatment or other patient management decisions. Negative results must be combined with clinical observations, patient history, and epidemiological information. The expected result is Negative.  Fact Sheet for Patients: SugarRoll.be  Fact Sheet for Healthcare Providers: https://www.woods-mathews.com/  This test is not yet approved or cleared by the [place] FDA and  has been authorized for detection and/or diagnosis of SARS-CoV-2 by FDA under an Emergency Use Authorization (EUA). This EUA will remain  in effect (meaning this test can be used) for the duration of the COVID-19 declaration under Se ction 564(b)(1) of the Act, 21 U.S.C. section 360bbb-3(b)(1), unless the authorization is terminated or revoked sooner.  Performed at [facility], [place] [address], [place], [place] [address]   MRSA PCR Screening     Status: None   Collection Time: 11/25/20  3:46 AM   Specimen: Nasopharyngeal  Result Value Ref Range Status   MRSA by PCR NEGATIVE NEGATIVE Final    Comment:        The GeneXpert MRSA Assay (FDA approved for NASAL specimens only), is one component of a comprehensive MRSA colonization surveillance program. It is not intended to diagnose MRSA infection nor to guide or monitor treatment for MRSA infections. Performed at [facility], [address] [place], [place] [address]          Radiology Studies: US Venous Img Lower Unilateral Right  Result Date: 11/24/2020 CLINICAL DATA:  Erythema of the right lower extremity EXAM: RIGHT LOWER  EXTREMITY VENOUS DOPPLER ULTRASOUND TECHNIQUE: Gray-scale sonography with compression, as well as color and duplex ultrasound, were performed to evaluate the deep venous system(s) from the level of the common femoral vein through the popliteal and proximal calf veins. COMPARISON:  None. FINDINGS: VENOUS Normal compressibility of the common femoral, superficial femoral, and popliteal veins, as well as the visualized calf veins. Visualized portions of profunda femoral vein and great saphenous vein unremarkable. No filling defects to suggest DVT on grayscale or color Doppler imaging. Doppler waveforms show normal direction  of venous flow, normal respiratory plasticity and response to augmentation. Limited views of the contralateral common femoral vein are unremarkable. OTHER Lower extremity edema is noted. Limitations: none IMPRESSION: 1. No evidence for DVT. 2. There is nonspecific lower extremity edema. Electronically Signed   By: Constance Holster M.D.   On: 11/24/2020 19:12   US ARTERIAL ABI (SCREENING LOWER EXTREMITY)  Result Date: 11/25/2020 CLINICAL DATA:  Peripheral artery disease EXAM: NONINVASIVE PHYSIOLOGIC VASCULAR STUDY OF BILATERAL LOWER EXTREMITIES TECHNIQUE: Evaluation of both lower extremities were performed at rest, including calculation of ankle-brachial indices with single level Doppler, pressure and pulse volume recording. COMPARISON:  None. FINDINGS: Right ABI:  0.5 Left ABI:  0.61 Right Lower Extremity:  Monophasic waveforms Left Lower Extremity:  Monophasic waveforms IMPRESSION: Decreased ABIs bilateral consistent with moderate arterial disease. Monophasic waveforms are noted at the ankles bilaterally. Electronically Signed   By: [name] M.D.   On: 11/25/2020 02:22   DG Foot Complete Right  Result Date: 11/24/2020 CLINICAL DATA:  Diabetic foot infection. EXAM: RIGHT FOOT COMPLETE - 3+ VIEW COMPARISON:  02/24/2020 FINDINGS: There is nonspecific soft tissue swelling about the foot  without evidence for an acute displaced fracture or dislocation. There is no radiopaque foreign body. There is no radiographic evidence for osteomyelitis. No definite soft tissue ulcer. IMPRESSION: Nonspecific soft tissue swelling about the foot without evidence for an acute displaced fracture or dislocation. Electronically Signed   By: Constance Holster M.D.   On: 11/24/2020 19:15        Scheduled Meds:  arformoterol  15 mcg Nebulization BID   And   umeclidinium bromide  1 puff Inhalation Daily   aspirin EC  81 mg Oral Daily   budesonide (PULMICORT) nebulizer solution  1 mg Nebulization BID   calcitRIOL  0.5 mcg Oral Daily   cholecalciferol  1,000 Units Oral QHS   gabapentin  300 mg Oral q morning - 10a   And   gabapentin  600 mg Oral QHS   heparin  5,000 Units Subcutaneous Q8H   insulin aspart  0-5 Units Subcutaneous QHS   insulin aspart  0-9 Units Subcutaneous TID WC   insulin glargine  13 Units Subcutaneous Daily   metoprolol tartrate  12.5 mg Oral BID   pantoprazole  40 mg Oral BID   pravastatin  40 mg Oral q1800   torsemide  60 mg Oral BID   Treprostinil  18 mcg Inhalation QID   Continuous Infusions:   ceFAZolin (ANCEF) IV Stopped (11/26/20 1123)     LOS: 1 day    Time spent: 25 minutes    Sidney Ace, MD [facility] Pager [phone]  If 7PM-7AM, please contact night-coverage 11/26/2020, 1:07 PM

## 2020-11-26 NOTE — ED Notes (Signed)
Pt states coming in due to the redness of her right foot. Pt states her leg/feet are always red, but that it got really bad several days ago. Pt states pain to the right foot, but states the color is getting better, and back to more normal.

## 2020-11-26 NOTE — ED Notes (Signed)
Pt upper left arm under BP cuff was very irritated and weeping clear water blood onto cuff. Tech switched arms to perform BP check and threw away old cuff. Tech leaves cuff off pt as it seems to irritate arm even more.

## 2020-11-26 NOTE — ED Notes (Signed)
Pt up to bathroom with ED tech. Pt back in hospital bed. Pt states pain was down lower than a 6, but walking on her feet, makes it hurt

## 2020-11-26 NOTE — ED Notes (Signed)
CBG 124 

## 2020-11-26 NOTE — ED Notes (Signed)
This tech applied humidifier due to pt complaining of her nose being too dry due to having to wear 4L of 02 [place]. Miquel Dunn, RN will be notified.

## 2020-11-26 NOTE — Evaluation (Signed)
Occupational Therapy Evaluation Patient Details Name: Lindsay Harmon MRN: [id number] DOB: [birth year] Today's Date: 11/26/2020    History of Present Illness 65 y.o. female with medical history significant for insulin-dependent diabetes mellitus, hypertension, PAD, diabetic neuropathy, pulmonary hypertension, hyperlipidemia, presented to the emergency department for chief concerns of right lower extremity pain and redness   Clinical Impression   [name] was seen for OT evaluation this date. Prior to hospital admission, pt was MOD I for mobility using 4WW and 4L O2. Pt lives in home with ramped entrance next door to mother and sister - pt and her sister provide IADL assistance for their mother. MOD I for in room ~50 ft mobility using 4WW. SpO2 mid 90s on 4.5 L [place] - desat 89% following ~3 mins mobility, resolved with seated rest. Pt demonstrates near baseline independence to perform ADL and mobility tasks. No skilled acute OT needs identified. Will sign off. Please re-consult if additional OT needs arise.     Follow Up Recommendations  No OT follow up    Equipment Recommendations  3 in 1 bedside commode    Recommendations for Other Services       Precautions / Restrictions Precautions Precautions: None Restrictions Weight Bearing Restrictions: No      Mobility Bed Mobility Overal bed mobility: Modified Independent                  Transfers Overall transfer level: Modified independent Equipment used: 4-wheeled walker                  Balance Overall balance assessment: Mild deficits observed, not formally tested                                         ADL either performed or assessed with clinical judgement   ADL Overall ADL's : Modified independent                                                          Pertinent Vitals/Pain Pain Assessment: Faces Faces Pain Scale: Hurts a little bit Pain Location: BLE Pain  Descriptors / Indicators: Aching;Dull Pain Intervention(s): Repositioned     Hand Dominance     Extremity/Trunk Assessment Upper Extremity Assessment Upper Extremity Assessment: Overall WFL for tasks assessed   Lower Extremity Assessment Lower Extremity Assessment: Overall WFL for tasks assessed       Communication Communication Communication: No difficulties   Cognition Arousal/Alertness: Awake/alert Behavior During Therapy: WFL for tasks assessed/performed Overall Cognitive Status: Within Functional Limits for tasks assessed                                     General Comments  SpO2 mid 90s on 4.5 L [place] - desat 89% following ~3 mins mobility, resolved with seated rest    Exercises Exercises: Other exercises Other Exercises Other Exercises: Pt educated re: OT role, DME recs, d/c recs, falls prevention, ECS Other Exercises: LBD, UBD, sup<>sit, sit<>stand, sitting/standing balance/tolerance, functional balance   Shoulder Instructions      Home Living Family/patient expects to be discharged to:: Private residence Living Arrangements: Alone Available Help at Discharge:  Family;Available PRN/intermittently Type of Home: House Home Access: Ramped entrance     Home Layout: One level     Bathroom Shower/Tub: Tub/shower unit         Home Equipment: Walker - 4 wheels;Hand held shower head   Additional Comments: Mother and sister live on either side of her, pt and her sister perform IADLs for mother.      Prior Functioning/Environment Level of Independence: Independent with assistive device(s)        Comments: MOD I using 4WW and 4L O2 at baseline        OT Problem List: Decreased strength;Decreased activity tolerance         OT Goals(Current goals can be found in the care plan section) Acute Rehab OT Goals Patient Stated Goal: To return home OT Goal Formulation: With patient Time For Goal Achievement: 12/10/20 Potential to Achieve Goals:  Good   AM-PAC OT "6 Clicks" Daily Activity     Outcome Measure Help from another person eating meals?: None Help from another person taking care of personal grooming?: None Help from another person toileting, which includes using toliet, bedpan, or urinal?: None Help from another person bathing (including washing, rinsing, drying)?: A Little Help from another person to put on and taking off regular upper body clothing?: None Help from another person to put on and taking off regular lower body clothing?: None 6 Click Score: 23   End of Session Equipment Utilized During Treatment: Rolling walker;Oxygen Nurse Communication: Mobility status  Activity Tolerance: Patient tolerated treatment well Patient left: in bed;with call bell/phone within reach  OT Visit Diagnosis: Other abnormalities of gait and mobility (R26.89)                Time: 4193-7902 OT Time Calculation (min): 14 min Charges:  OT General Charges $OT Visit: 1 Visit OT Evaluation $OT Eval Low Complexity: 1 Low OT Treatments $Self Care/Home Management : 8-22 mins  Dessie Coma, M.S. OTR/L  11/26/20, 11:52 AM  ascom [phone]

## 2020-11-26 NOTE — ED Notes (Signed)
Advised nurse that patient has assigned bed

## 2020-11-26 NOTE — Progress Notes (Signed)
Pharmacy Antibiotic Note  Lindsay Harmon is a 65 y.o. female c/o lower extremity pain and redness admitted on 11/24/2020 with cellulitis w/ abscess of left leg.  Pharmacy has been consulted for Cefazolin dosing. Patient previously received 2 doses of Vancomycin and Cefepime prior.  Med hx: DM w/ neuropathy, HTN, PAD, Pulm HTN, HL. Afebrile, LA 4.3, WBC 8.1.  Plan: Cefazolin 2g Q12 hours per indication and renal fxn(CrCl<58m/min)  SCr used: 2.2 (Slightly elevated above baseline)  Pharmacy will continue to monitor for lab cx results. Pharmacy will continue to follow renal fxn and adjust abx doses as appropriate.   Height: 5' 1" (154.9 cm) Weight: 68 kg (150 lb) IBW/kg (Calculated) : 47.8  Temp (24hrs), Avg:97.9 F (36.6 C), Min:97.5 F (36.4 C), Max:98.2 F (36.8 C)  Recent Labs  Lab 11/24/20 1728 11/24/20 1729 11/24/20 2049 11/25/20 0243 11/25/20 0346 11/25/20 1339 11/25/20 1633 11/26/20 0457  WBC 8.1  --   --   --  8.0  --   --  6.5  CREATININE 2.87*  --   --   --  2.61*  --   --  2.20*  LATICACIDVEN  --  2.0* 4.3* 2.9*  --  2.2* 2.3*  --     Estimated Creatinine Clearance: 22.2 mL/min (A) (by C-G formula based on SCr of 2.2 mg/dL (H)).    Allergies  Allergen Reactions  . Sertraline Hcl Other (See Comments)    Altered Mental Status    Antimicrobials this admission: 01/28 Vanc >> 1/30 01/29 Cefepime >> 1/30   Microbiology results: 01/28 BCx: NGTD 01/30 MRSA PCR: negative  Thank you for allowing pharmacy to be a part of this patient's care.  WPearla Dubonnet PharmD Clinical Pharmacist 11/26/2020 8:26 AM

## 2020-11-27 LAB — CBC WITH DIFFERENTIAL/PLATELET
Abs Immature Granulocytes: 0.02 10*3/uL (ref 0.00–0.07)
Basophils Absolute: 0 10*3/uL (ref 0.0–0.1)
Basophils Relative: 1 %
Eosinophils Absolute: 0.2 10*3/uL (ref 0.0–0.5)
Eosinophils Relative: 2 %
HCT: 48.2 % — ABNORMAL HIGH (ref 36.0–46.0)
Hemoglobin: 15.3 g/dL — ABNORMAL HIGH (ref 12.0–15.0)
Immature Granulocytes: 0 %
Lymphocytes Relative: 14 %
Lymphs Abs: 1 10*3/uL (ref 0.7–4.0)
MCH: 29.4 pg (ref 26.0–34.0)
MCHC: 31.7 g/dL (ref 30.0–36.0)
MCV: 92.7 fL (ref 80.0–100.0)
Monocytes Absolute: 0.6 10*3/uL (ref 0.1–1.0)
Monocytes Relative: 8 %
Neutro Abs: 5.4 10*3/uL (ref 1.7–7.7)
Neutrophils Relative %: 75 %
Platelets: 158 10*3/uL (ref 150–400)
RBC: 5.2 MIL/uL — ABNORMAL HIGH (ref 3.87–5.11)
RDW: 16.6 % — ABNORMAL HIGH (ref 11.5–15.5)
WBC: 7.2 10*3/uL (ref 4.0–10.5)
nRBC: 0 % (ref 0.0–0.2)

## 2020-11-27 LAB — BASIC METABOLIC PANEL
Anion gap: 14 (ref 5–15)
BUN: 41 mg/dL — ABNORMAL HIGH (ref 8–23)
CO2: 40 mmol/L — ABNORMAL HIGH (ref 22–32)
Calcium: 9.9 mg/dL (ref 8.9–10.3)
Chloride: 84 mmol/L — ABNORMAL LOW (ref 98–111)
Creatinine, Ser: 2.52 mg/dL — ABNORMAL HIGH (ref 0.44–1.00)
GFR, Estimated: 20 mL/min — ABNORMAL LOW (ref >60)
Glucose, Bld: 134 mg/dL — ABNORMAL HIGH (ref 70–99)
Potassium: 3.1 mmol/L — ABNORMAL LOW (ref 3.5–5.1)
Sodium: 138 mmol/L (ref 135–145)

## 2020-11-27 LAB — GLUCOSE, CAPILLARY
Glucose-Capillary: 104 mg/dL — ABNORMAL HIGH (ref 70–99)
Glucose-Capillary: 192 mg/dL — ABNORMAL HIGH (ref 70–99)
Glucose-Capillary: 188 mg/dL — ABNORMAL HIGH (ref 70–99)
Glucose-Capillary: 120 mg/dL — ABNORMAL HIGH (ref 70–99)

## 2020-11-27 MED ORDER — POTASSIUM CHLORIDE CRYS ER 20 MEQ PO TBCR
40.0000 meq | EXTENDED_RELEASE_TABLET | Freq: Once | ORAL | Status: AC
  Administered 2020-11-27: 40 meq via ORAL
  Filled 2020-11-27: qty 2

## 2020-11-27 MED ORDER — OXYCODONE HCL 5 MG PO TABS
5.0000 mg | ORAL_TABLET | ORAL | Status: DC | PRN
  Administered 2020-11-27 – 2020-11-28 (×4): 5 mg via ORAL
  Filled 2020-11-27 (×6): qty 1

## 2020-11-27 MED ORDER — TREPROSTINIL 0.6 MG/ML IN SOLN: 18.0000 ug | Freq: Four times a day (QID) | RESPIRATORY_TRACT | Status: DC

## 2020-11-27 NOTE — ED Notes (Signed)
Call back from charge for pt  - concern for pt's O2 delivery addressed

## 2020-11-27 NOTE — Evaluation (Signed)
Physical Therapy Evaluation Patient Details Name: Lindsay Harmon MRN: [id number] DOB: 05-05-1956 Today's Date: 11/27/2020   History of Present Illness  65 y.o. female with medical history significant for insulin-dependent diabetes mellitus, hypertension, PAD, diabetic neuropathy, pulmonary hypertension, hyperlipidemia, presented to the emergency department for chief concerns of right lower extremity pain and redness  Clinical Impression  Pt is a pleasant 65 year old female who was admitted for cellulitis and abscess of L leg. Pt performs bed mobility/transfers with mod I and ambulation with supervision and rollater. Reports pain in R foot, MD aware and reports slight improvement after ambulation. All mobility performed on baseline level of O2 with sats ranging between 86-87%. Per patient, she feels at baseline level. Pt does not require any further PT needs at this time. Pt will be dc in house and does not require follow up. RN aware. Will dc current orders.     Follow Up Recommendations No PT follow up    Equipment Recommendations  None recommended by PT    Recommendations for Other Services       Precautions / Restrictions Precautions Precautions: Fall Restrictions Weight Bearing Restrictions: No      Mobility  Bed Mobility Overal bed mobility: Modified Independent             General bed mobility comments: safe technique    Transfers Overall transfer level: Modified independent Equipment used: 4-wheeled walker             General transfer comment: safe technique. All mobility performed on 4L of O2  Ambulation/Gait Ambulation/Gait assistance: Supervision Gait Distance (Feet): 200 Feet Assistive device: 4-wheeled walker Gait Pattern/deviations: Step-through pattern     General Gait Details: ambulated around RN station demonstrating reciprocal gait pattern. Slight SOB symptoms noted. O2 sats at 87% on 4L of O2 post ambulation with HR increasing to  129bpm.  Stairs            Wheelchair Mobility    Modified Rankin (Stroke Patients Only)       Balance Overall balance assessment: Mild deficits observed, not formally tested                                           Pertinent Vitals/Pain Pain Assessment: Faces Faces Pain Scale: Hurts little more Pain Location: R foot Pain Descriptors / Indicators: Aching;Dull Pain Intervention(s): Limited activity within patient's tolerance    Home Living Family/patient expects to be discharged to:: Private residence Living Arrangements: Alone Available Help at Discharge: Family;Available PRN/intermittently Type of Home: House Home Access: Ramped entrance     Home Layout: One level Home Equipment: Walker - 4 wheels;Hand held shower head Additional Comments: Mother and sister live on either side of her, pt and her sister perform IADLs for mother.    Prior Function Level of Independence: Independent with assistive device(s)         Comments: MOD I using 4WW and 4L O2 at baseline     Hand Dominance        Extremity/Trunk Assessment   Upper Extremity Assessment Upper Extremity Assessment: Overall WFL for tasks assessed    Lower Extremity Assessment Lower Extremity Assessment: Overall WFL for tasks assessed       Communication   Communication: No difficulties  Cognition Arousal/Alertness: Awake/alert Behavior During Therapy: WFL for tasks assessed/performed Overall Cognitive Status: Within Functional Limits for tasks assessed  General Comments      Exercises     Assessment/Plan    PT Assessment Patent does not need any further PT services  PT Problem List         PT Treatment Interventions      PT Goals (Current goals can be found in the Care Plan section)  Acute Rehab PT Goals Patient Stated Goal: To return home PT Goal Formulation: All assessment and education complete, DC  therapy Time For Goal Achievement: 11/27/20 Potential to Achieve Goals: Good    Frequency     Barriers to discharge        Co-evaluation               AM-PAC PT "6 Clicks" Mobility  Outcome Measure Help needed turning from your back to your side while in a flat bed without using bedrails?: None Help needed moving from lying on your back to sitting on the side of a flat bed without using bedrails?: None Help needed moving to and from a bed to a chair (including a wheelchair)?: None Help needed standing up from a chair using your arms (e.g., wheelchair or bedside chair)?: None Help needed to walk in hospital room?: None Help needed climbing 3-5 steps with a railing? : A Little 6 Click Score: 23    End of Session Equipment Utilized During Treatment: Gait belt;Oxygen Activity Tolerance: Patient tolerated treatment well Patient left: in bed Nurse Communication: Mobility status PT Visit Diagnosis: Muscle weakness (generalized) (M62.81);Difficulty in walking, not elsewhere classified (R26.2)    Time: 4715-9539 PT Time Calculation (min) (ACUTE ONLY): 20 min   Charges:   PT Evaluation $PT Eval Low Complexity: 1 Low PT Treatments $Gait Training: 8-22 mins        Greggory Stallion, PT, DPT [phone]   Travious Vanover 11/27/2020, 4:49 PM

## 2020-11-27 NOTE — Progress Notes (Signed)
PROGRESS NOTE    Lindsay Harmon  ACZ:[id number] DOB: Dec 14, 1955 DOA: 11/24/2020 PCP: Venia Carbon, MD    Brief Narrative:  65 y.o. female with medical history significant for insulin-dependent diabetes mellitus, hypertension, PAD, diabetic neuropathy, pulmonary hypertension, hyperlipidemia, presented to the emergency department for chief concerns of right lower extremity pain and redness.  She states the pain, warmth, and swelling of her right leg that started on 11/23/20. She woke up with the pain, redness, and swelling, and warmth. However, she didn't think anything of it because at baseline her legs are red. However, upon getting up out of bed, the pain worsened is achy and at it's peak it was a 5/10. It is worst with ambulation. She endorsed increased warmness. She denies numbness and tingling of the extremity. She denies bug bites. She denies trauma to the legs in the lower extremities. She did hit right leg against the heal of her boot today when she was putting on her boot. However this was after the pain, warmth, and swelling in her legs.  Patient was started on broad-spectrum IV antibiotics after cultures drawn in the emergency room.  Her lower extremity cellulitis appears to be improving.  All cultures no growth to date   Assessment & Plan:   Principal Problem:   Cellulitis and abscess of left leg Active Problems:   Type 2 diabetes mellitus with neurological manifestations, controlled (HCC)   Essential hypertension, benign   Chronic back pain   Advance directive discussed with patient   Chronic diastolic heart failure (HCC)   Chronic respiratory failure with hypoxia (HCC)   COPD (chronic obstructive pulmonary disease) (HCC)   Cor pulmonale, chronic (HCC)   Pulmonary hypertension (HCC)   Lower extremity cellulitis  Cellulitis of left lower extremity Sepsis secondary to above Suspect secondary to chronic venous insufficiency and underlying PVD Started on  broad-spectrum antibiotics on admission ABI bilateral lower extremity ordered, moderate PAD noted Has been improving on IV Ancef Plan: Continue Ancef with pharmacy dosing No further IV fluids needed Pain control as needed Follow cultures Monitor labs and fever curve Therapy evaluations  AKI on CKD stage IV Holding metolazone Resumed home torsemide Kidney function slowly improving Daily BMP  Insulin-dependent diabetes mellitus Diabetic neuropathy Continue basal bolus regimen 13 units Lantus daily Moderate sliding scale 3 times daily with meals bedtime coverage Continue home gabapentin  Pulmonary hypertension Resume home treprostinil nebulizer   Essential hypertension Resume Lopressor 12.5 mg p.o. twice daily  Hyperlipidemia Continue pravastatin 40 mg daily  Anxiety Xanax as needed per home dose    DVT prophylaxis: SQ heparin Code Status: DNR Family Communication: None today Disposition Plan: Status is: Inpatient  Remains inpatient appropriate because:Inpatient level of care appropriate due to severity of illness   Dispo: The patient is from: Home              Anticipated d/c is to: Home              Anticipated d/c date is: 1 day              Patient currently is not medically stable to d/c.   Difficult to place patient No   Lower extremity cellulitis.  Exam improving.  Hopefully patient will be stable for discharge by tomorrow.  Will transition to oral antibiotics at time of discharge.                Level of care: Med-Surg  Consultants:   None  Procedures:  None  Antimicrobials:  Cefazolin   Subjective: Patient seen and examined.  Reports some pain in right foot when ambulating.  Otherwise exam is improving.  Objective: Vitals:   11/27/20 0147 11/27/20 0726 11/27/20 0757 11/27/20 1228  BP: 126/68 128/82  120/73  Pulse: 69 77 74 84  Resp: _0 Temp: 98.4 F (36.9 C) 98.1 F (36.7 C)  97.9 F (36.6 C)  TempSrc:  Oral Oral    SpO2: 94% 100% 99% 97%  Weight:      Height:        Intake/Output Summary (Last 24 hours) at 11/27/2020 1259 Last data filed at 11/27/2020 1007 Gross per 24 hour  Intake 340 ml  Output 600 ml  Net -260 ml   Filed Weights   11/24/20 1726  Weight: 68 kg    Examination:  General exam: Appears calm and comfortable  Respiratory system: Clear to auscultation. Respiratory effort normal. Cardiovascular system: S1 & S2 heard, RRR. No JVD, murmurs, rubs, gallops or clicks. No pedal edema. Gastrointestinal system: Abdomen is nondistended, soft and nontender. No organomegaly or masses felt. Normal bowel sounds heard. Central nervous system: Alert and oriented. No focal neurological deficits. Extremities: BL LE redness, RLE swollen, tender to touch, increased warmth Skin: No rashes, lesions or ulcers Psychiatry: Judgement and insight appear normal. Mood & affect appropriate.     Data Reviewed: I have personally reviewed following labs and imaging studies  CBC: Recent Labs  Lab 11/24/20 1728 11/25/20 0346 11/26/20 0457 11/27/20 0615  WBC 8.1 8.0 6.5 7.2  NEUTROABS 6.3  --  4.8 5.4  HGB 15.4* 13.5 14.2 15.3*  HCT 47.1* 42.7 43.6 48.2*  MCV 92.2 94.3 91.8 92.7  PLT 174 146* 159 972   Basic Metabolic Panel: Recent Labs  Lab 11/24/20 1728 11/25/20 0346 11/26/20 0457 11/27/20 0615  NA 138 138 138 138  K 3.9 3.8 3.4* 3.1*  CL 88* 92* 90* 84*  CO2 35* 34* 36* 40*  GLUCOSE 184* 142* 173* 134*  BUN 49* 43* 41* 41*  CREATININE 2.87* 2.61* 2.20* 2.52*  CALCIUM 10.3 9.6 9.7 9.9   GFR: Estimated Creatinine Clearance: 19.4 mL/min (A) (by C-G formula based on SCr of 2.52 mg/dL (H)). Liver Function Tests: Recent Labs  Lab 11/24/20 1728 11/25/20 0346  AST 44* 42*  ALT 35 30  ALKPHOS 65 58  BILITOT 2.0* 1.5*  PROT 7.1 6.1*  ALBUMIN 3.7 3.1*   No results for input(s): LIPASE, AMYLASE in the last 168 hours. No results for input(s): AMMONIA in the last 168  hours. Coagulation Profile: Recent Labs  Lab 11/25/20 0346  INR 1.6*   Cardiac Enzymes: No results for input(s): CKTOTAL, CKMB, CKMBINDEX, TROPONINI in the last 168 hours. BNP (last 3 results) No results for input(s): PROBNP in the last 8760 hours. HbA1C: Recent Labs    11/25/20 0243  HGBA1C 7.4*   CBG: Recent Labs  Lab 11/26/20 1205 11/26/20 1605 11/26/20 2221 11/27/20 0853 11/27/20 1226  GLUCAP 170* 124* 141* 104* 192*   Lipid Profile: No results for input(s): CHOL, HDL, LDLCALC, TRIG, CHOLHDL, LDLDIRECT in the last 72 hours. Thyroid Function Tests: Recent Labs    11/25/20 0243  TSH 8.149*   Anemia Panel: No results for input(s): VITAMINB12, FOLATE, FERRITIN, TIBC, IRON, RETICCTPCT in the last 72 hours. Sepsis Labs: Recent Labs  Lab 11/24/20 2049 11/25/20 0243 11/25/20 1339 11/25/20 1633  LATICACIDVEN 4.3* 2.9* 2.2* 2.3*    Recent Results (from the past 240  hour(s))  Culture, blood (routine x 2)     Status: None (Preliminary result)   Collection Time: 11/24/20  6:53 PM   Specimen: BLOOD  Result Value Ref Range Status   Specimen Description BLOOD BLOOD LEFT FOREARM  Final   Special Requests   Final    BOTTLES DRAWN AEROBIC AND ANAEROBIC Blood Culture adequate volume   Culture   Final    NO GROWTH 3 DAYS Performed at [facility], [address], [place], [place] [address]    Report Status PENDING  Incomplete  Culture, blood (routine x 2)     Status: None (Preliminary result)   Collection Time: 11/24/20  6:58 PM   Specimen: BLOOD  Result Value Ref Range Status   Specimen Description BLOOD BLOOD RIGHT FOREARM  Final   Special Requests   Final    BOTTLES DRAWN AEROBIC AND ANAEROBIC Blood Culture adequate volume   Culture   Final    NO GROWTH 3 DAYS Performed at [facility], [place], [place] [address]    Report Status PENDING  Incomplete  SARS CORONAVIRUS 2 (TAT 6-24 HRS) Nasopharyngeal Nasopharyngeal Swab      Status: None   Collection Time: 11/24/20 10:00 PM   Specimen: Nasopharyngeal Swab  Result Value Ref Range Status   SARS Coronavirus 2 NEGATIVE NEGATIVE Final    Comment: (NOTE) SARS-CoV-2 target nucleic acids are NOT DETECTED.  The SARS-CoV-2 RNA is generally detectable in upper and lower respiratory specimens during the acute phase of infection. Negative results do not preclude SARS-CoV-2 infection, do not rule out co-infections with other pathogens, and should not be used as the sole basis for treatment or other patient management decisions. Negative results must be combined with clinical observations, patient history, and epidemiological information. The expected result is Negative.  Fact Sheet for Patients: SugarRoll.be  Fact Sheet for Healthcare Providers: https://www.woods-mathews.com/  This test is not yet approved or cleared by the [place] FDA and  has been authorized for detection and/or diagnosis of SARS-CoV-2 by FDA under an Emergency Use Authorization (EUA). This EUA will remain  in effect (meaning this test can be used) for the duration of the COVID-19 declaration under Se ction 564(b)(1) of the Act, 21 U.S.C. section 360bbb-3(b)(1), unless the authorization is terminated or revoked sooner.  Performed at [facility], [place] [address], [place], [place] [address]   MRSA PCR Screening     Status: None   Collection Time: 11/25/20  3:46 AM   Specimen: Nasopharyngeal  Result Value Ref Range Status   MRSA by PCR NEGATIVE NEGATIVE Final    Comment:        The GeneXpert MRSA Assay (FDA approved for NASAL specimens only), is one component of a comprehensive MRSA colonization surveillance program. It is not intended to diagnose MRSA infection nor to guide or monitor treatment for MRSA infections. Performed at [facility], [address] [place],  [address]          Radiology Studies: DG  Foot Complete Right  Result Date: 11/27/2020 Please see detailed radiograph report in office note.       Scheduled Meds: . arformoterol  15 mcg Nebulization BID   And  . umeclidinium bromide  1 puff Inhalation Daily  . aspirin EC  81 mg Oral Daily  . budesonide (PULMICORT) nebulizer solution  1 mg Nebulization BID  . calcitRIOL  0.5 mcg Oral Daily  . cholecalciferol  1,000 Units Oral QHS  .  gabapentin  300 mg Oral q morning - 10a   And  . gabapentin  600 mg Oral QHS  . heparin  5,000 Units Subcutaneous Q8H  . insulin aspart  0-5 Units Subcutaneous QHS  . insulin aspart  0-9 Units Subcutaneous TID WC  . insulin glargine  13 Units Subcutaneous Daily  . metoprolol tartrate  12.5 mg Oral BID  . pantoprazole  40 mg Oral BID  . pravastatin  40 mg Oral q1800  . Treprostinil  18 mcg Inhalation Q6H   Continuous Infusions: .  ceFAZolin (ANCEF) IV 2 g (11/27/20 1133)     LOS: 2 days    Time spent: 25 minutes    Sidney Ace, MD [facility] Pager [phone]  If 7PM-7AM, please contact night-coverage 11/27/2020, 12:59 PM

## 2020-11-27 NOTE — ED Notes (Addendum)
Called charge for "purple man" delay, advised to call floor charge  Almyra Free on floor reports charge in pt room will call me back

## 2020-11-28 ENCOUNTER — Encounter: Payer: Self-pay | Admitting: Podiatry

## 2020-11-28 LAB — CBC WITH DIFFERENTIAL/PLATELET
Abs Immature Granulocytes: 0.03 10*3/uL (ref 0.00–0.07)
Basophils Absolute: 0 10*3/uL (ref 0.0–0.1)
Basophils Relative: 1 %
Eosinophils Absolute: 0.2 10*3/uL (ref 0.0–0.5)
Eosinophils Relative: 3 %
HCT: 48.6 % — ABNORMAL HIGH (ref 36.0–46.0)
Hemoglobin: 15.5 g/dL — ABNORMAL HIGH (ref 12.0–15.0)
Immature Granulocytes: 0 %
Lymphocytes Relative: 13 %
Lymphs Abs: 1 10*3/uL (ref 0.7–4.0)
MCH: 29.8 pg (ref 26.0–34.0)
MCHC: 31.9 g/dL (ref 30.0–36.0)
MCV: 93.3 fL (ref 80.0–100.0)
Monocytes Absolute: 0.8 10*3/uL (ref 0.1–1.0)
Monocytes Relative: 10 %
Neutro Abs: 5.7 10*3/uL (ref 1.7–7.7)
Neutrophils Relative %: 73 %
Platelets: 156 10*3/uL (ref 150–400)
RBC: 5.21 MIL/uL — ABNORMAL HIGH (ref 3.87–5.11)
RDW: 16.7 % — ABNORMAL HIGH (ref 11.5–15.5)
WBC: 7.7 10*3/uL (ref 4.0–10.5)
nRBC: 0 % (ref 0.0–0.2)

## 2020-11-28 LAB — BASIC METABOLIC PANEL
Anion gap: 13 (ref 5–15)
BUN: 45 mg/dL — ABNORMAL HIGH (ref 8–23)
CO2: 39 mmol/L — ABNORMAL HIGH (ref 22–32)
Calcium: 9.9 mg/dL (ref 8.9–10.3)
Chloride: 87 mmol/L — ABNORMAL LOW (ref 98–111)
Creatinine, Ser: 2.22 mg/dL — ABNORMAL HIGH (ref 0.44–1.00)
GFR, Estimated: 24 mL/min — ABNORMAL LOW (ref >60)
Glucose, Bld: 106 mg/dL — ABNORMAL HIGH (ref 70–99)
Potassium: 4 mmol/L (ref 3.5–5.1)
Sodium: 139 mmol/L (ref 135–145)

## 2020-11-28 LAB — GLUCOSE, CAPILLARY
Glucose-Capillary: 94 mg/dL (ref 70–99)
Glucose-Capillary: 202 mg/dL — ABNORMAL HIGH (ref 70–99)

## 2020-11-28 MED ORDER — AMOXICILLIN-POT CLAVULANATE 500-125 MG PO TABS: 1.0000 | ORAL_TABLET | Freq: Two times a day (BID) | ORAL | 0 refills | Status: AC

## 2020-11-28 MED ORDER — OXYCODONE HCL 5 MG PO TABS: 5.0000 mg | ORAL_TABLET | Freq: Three times a day (TID) | ORAL | 0 refills | Status: AC | PRN

## 2020-11-28 NOTE — Discharge Summary (Signed)
Physician Discharge Summary  Lindsay Harmon KDT:[id number] DOB: November 03, 1955 DOA: 11/24/2020  PCP: Venia Carbon, MD  Admit date: 11/24/2020 Discharge date: 11/28/2020  Admitted From: Home Disposition:  Home  Recommendations for Outpatient Follow-up:  1. Follow up with PCP in 1-2 weeks 2.   Home Health:No Equipment/Devices:Oxygen 4L (home rate) Discharge Condition:Stable CODE STATUS:DNR Diet recommendation: Heart Healthy / Carb Modified  Brief/Interim Summary: 65 y.o.femalewith medical history significant forinsulin-dependent diabetes mellitus, hypertension, PAD, diabetic neuropathy, pulmonary hypertension, hyperlipidemia, presented to the emergency department for chief concerns of right lower extremity pain and redness.  She states the pain, warmth, and swelling of her right leg that started on 11/23/20. She woke up with the pain, redness, and swelling, and warmth. However, she didn't think anything of it because at baseline her legs are red. However, upon getting up out of bed, the pain worsened is achy and at it's peak it was a 5/10. It is worst with ambulation. She endorsed increased warmness. She denies numbness and tingling of the extremity. She denies bug bites. She denies trauma to the legs in the lower extremities. She did hit right leg against the heal of her boot today when she was putting on her boot. However this was after the pain, warmth, and swelling in her legs.  Patient was started on broad-spectrum IV antibiotics after cultures drawn in the emergency room.  Her lower extremity cellulitis appears to be improving.   At time of discharge exam had improved substantially.  Patient still having interim shooting pain in that right lower extremity but she ambulated around the hallway without issues.  Seen by therapy and no PT or OT follow-up recommended.  Patient stable for discharge at this time.  Will convert to Augmentin reduced dose considering kidney function, and plan  on 7-day total course.   Discharge Diagnoses:  Principal Problem:   Cellulitis and abscess of left leg Active Problems:   Type 2 diabetes mellitus with neurological manifestations, controlled (HCC)   Essential hypertension, benign   Chronic back pain   Advance directive discussed with patient   Chronic diastolic heart failure (HCC)   Chronic respiratory failure with hypoxia (HCC)   COPD (chronic obstructive pulmonary disease) (HCC)   Cor pulmonale, chronic (HCC)   Pulmonary hypertension (HCC)   Lower extremity cellulitis  Cellulitis of left lower extremity Sepsis secondary to above Suspect secondary to chronic venous insufficiency and underlying PVD Started on broad-spectrum antibiotics on admission ABI bilateral lower extremity ordered, moderate PAD noted Has been improving on IV Ancef Transition to Augmentin at time of discharge Complete 7-day total antibiotic course Stable for discharge at this time Follow-up outpatient PCP  AKI on CKD stage IV Kidney function baseline at time of discharge.  Can resume home regimen  Insulin-dependent diabetes mellitus Diabetic neuropathy Resume home regimen at time of discharge  Pulmonary hypertension Resume home treprostinil nebulizer   Essential hypertension Resume Lopressor 12.5 mg p.o. twice daily  Hyperlipidemia Continue pravastatin 40 mg daily  Anxiety Xanax as needed per home dose  Discharge Instructions  Discharge Instructions    Diet - low sodium heart healthy   Complete by: As directed    Increase activity slowly   Complete by: As directed      Allergies as of 11/28/2020      Reactions   Sertraline Hcl Other (See Comments)   Altered Mental Status      Medication List    TAKE these medications   albuterol (2.5 MG/3ML) 0.083% nebulizer  solution Commonly known as: PROVENTIL USE 1 VIAL (3ML) BY NEBULIZATION EVERY 6HOURS AS NEEDED FOR WHEEZING OR SHORTNESS OF BREATH   ProAir HFA 108 (90 Base) MCG/ACT  inhaler Generic drug: albuterol USE 2 PUFFS EVERY 6 HOURS AS NEEDED FOR WHEEZING OR SHORTNESS OF BREATH   ALPRAZolam 0.25 MG tablet Commonly known as: XANAX Take 1 tablet (0.25 mg total) by mouth daily as needed for anxiety.   amoxicillin-clavulanate 500-125 MG tablet Commonly known as: Augmentin Take 1 tablet (500 mg total) by mouth 2 (two) times daily for 4 days.   Asmanex (120 Metered Doses) 220 MCG/INH inhaler Generic drug: mometasone Inhale 2 puffs into the lungs 2 (two) times daily.   aspirin EC 81 MG tablet Take 1 tablet (81 mg total) by mouth daily.   B-D UF III MINI PEN NEEDLES 31G X 5 MM Misc Generic drug: Insulin Pen Needle USE 1 PEN NEEDLE TO INJECT INSULIN   calcitRIOL 0.5 MCG capsule Commonly known as: ROCALTROL Take 0.5 mcg by mouth daily.   cholecalciferol 1000 units tablet Commonly known as: VITAMIN D Take 1,000 Units by mouth at bedtime.   ferrous sulfate 325 (65 FE) MG tablet Take 1 tablet by mouth daily.   Flutter Devi Use as directed   gabapentin 300 MG capsule Commonly known as: NEURONTIN TAKE 1 CAPSULE BY MOUTH IN THE MORNING AND 2 CAPSULES BY MOUTH AT BEDTIME   glucose blood test strip Commonly known as: ONE TOUCH ULTRA TEST USE 1 STRIP TO TEST BLOOD SUGAR TWICE DAILY DUE TO LOW BLOOD SUGAR. TAKES LANTUS INSULIN Dx Code E11.51   Lantus SoloStar 100 UNIT/ML Solostar Pen Generic drug: insulin glargine Inject 15 Units into the skin daily.   loratadine 10 MG tablet Commonly known as: CLARITIN Take 10 mg by mouth 2 (two) times daily.   lovastatin 40 MG tablet Commonly known as: MEVACOR Take 2 tablets (80 mg total) by mouth at bedtime.   methadone 10 MG tablet Commonly known as: DOLOPHINE TAKE 2 TABLETS BY MOUTH EVERY 6 HOURS ASNEEDED   metolazone 2.5 MG tablet Commonly known as: ZAROXOLYN TAKE 1 TABLET BY MOUTH ONCE A WEEK EVERYTUESDAY   metoprolol tartrate 25 MG tablet Commonly known as: LOPRESSOR Take 1/2 tablet by mouth twice a  day   nitroGLYCERIN 0.4 MG SL tablet Commonly known as: NITROSTAT Place 1 tablet (0.4 mg total) under the tongue every 5 (five) minutes as needed for chest pain.   oxyCODONE 5 MG immediate release tablet Commonly known as: Roxicodone Take 1 tablet (5 mg total) by mouth every 8 (eight) hours as needed for up to 4 days.   pantoprazole 40 MG tablet Commonly known as: PROTONIX Take 1 tablet by mouth twice a day   potassium chloride SA 20 MEQ tablet Commonly known as: KLOR-CON Take 2 tablets (40 mEq total) by mouth once a week. Every Tuesday   Stiolto Respimat 2.5-2.5 MCG/ACT Aers Generic drug: Tiotropium Bromide-Olodaterol INHALE 2 PUFFS BY MOUTH INTO THE LUNGS DAILY   torsemide 20 MG tablet Commonly known as: DEMADEX Take 3 tablets (60 mg total) by mouth 2 (two) times daily. Hold afternoon dose if your weight is under 150# at home   Treprostinil 0.6 MG/ML Soln Commonly known as: TYVASO Inhale 18 mcg into the lungs 4 (four) times daily.       Allergies  Allergen Reactions  . Sertraline Hcl Other (See Comments)    Altered Mental Status    Consultations:  None   Procedures/Studies: US Venous Img  Lower Unilateral Right  Result Date: 11/24/2020 CLINICAL DATA:  Erythema of the right lower extremity EXAM: RIGHT LOWER EXTREMITY VENOUS DOPPLER ULTRASOUND TECHNIQUE: Gray-scale sonography with compression, as well as color and duplex ultrasound, were performed to evaluate the deep venous system(s) from the level of the common femoral vein through the popliteal and proximal calf veins. COMPARISON:  None. FINDINGS: VENOUS Normal compressibility of the common femoral, superficial femoral, and popliteal veins, as well as the visualized calf veins. Visualized portions of profunda femoral vein and great saphenous vein unremarkable. No filling defects to suggest DVT on grayscale or color Doppler imaging. Doppler waveforms show normal direction of venous flow, normal respiratory plasticity  and response to augmentation. Limited views of the contralateral common femoral vein are unremarkable. OTHER Lower extremity edema is noted. Limitations: none IMPRESSION: 1. No evidence for DVT. 2. There is nonspecific lower extremity edema. Electronically Signed   By: Constance Holster M.D.   On: 11/24/2020 19:12   US ARTERIAL ABI (SCREENING LOWER EXTREMITY)  Result Date: 11/25/2020 CLINICAL DATA:  Peripheral artery disease EXAM: NONINVASIVE PHYSIOLOGIC VASCULAR STUDY OF BILATERAL LOWER EXTREMITIES TECHNIQUE: Evaluation of both lower extremities were performed at rest, including calculation of ankle-brachial indices with single level Doppler, pressure and pulse volume recording. COMPARISON:  None. FINDINGS: Right ABI:  0.5 Left ABI:  0.61 Right Lower Extremity:  Monophasic waveforms Left Lower Extremity:  Monophasic waveforms IMPRESSION: Decreased ABIs bilateral consistent with moderate arterial disease. Monophasic waveforms are noted at the ankles bilaterally. Electronically Signed   By: [name] M.D.   On: 11/25/2020 02:22   DG Foot Complete Right  Result Date: 11/27/2020 Please see detailed radiograph report in office note.  DG Foot Complete Right  Result Date: 11/24/2020 CLINICAL DATA:  Diabetic foot infection. EXAM: RIGHT FOOT COMPLETE - 3+ VIEW COMPARISON:  02/24/2020 FINDINGS: There is nonspecific soft tissue swelling about the foot without evidence for an acute displaced fracture or dislocation. There is no radiopaque foreign body. There is no radiographic evidence for osteomyelitis. No definite soft tissue ulcer. IMPRESSION: Nonspecific soft tissue swelling about the foot without evidence for an acute displaced fracture or dislocation. Electronically Signed   By: Constance Holster M.D.   On: 11/24/2020 19:15    (Echo, Carotid, EGD, Colonoscopy, ERCP)    Subjective: Patient seen and examined at the time of discharge.  Stable, no distress.  Reporting intermittent shooting pains in  her right lower extremity but improved from prior.  Physical exam improved.  Stable discharge home.  Discharge Exam: Vitals:   11/28/20 0819 11/28/20 1125  BP: 104/65 106/72  Pulse: 87 90  Resp: 18 16  Temp: 98.7 F (37.1 C) 98.5 F (36.9 C)  SpO2: 96% 95%   Vitals:   11/28/20 0445 11/28/20 0815 11/28/20 0819 11/28/20 1125  BP: 124/79 (!) 104/54 104/65 106/72  Pulse: 91 89 87 90  Resp: (!) _0 Temp: 98.6 F (37 C)  98.7 F (37.1 C) 98.5 F (36.9 C)  TempSrc:   Oral Oral  SpO2: 98%  96% 95%  Weight:      Height:        General: Pt is alert, awake, not in acute distress Cardiovascular: Regular rate and rhythm, 4/6 systolic murmur Respiratory: Lung sounds diffusely diminished.  Scattered fine crackles.  Normal work of breathing.  4 L Abdominal: Soft, NT, ND, bowel sounds + Extremities: Chronic erythema/PVD noted in bilateral lower extremities.    The results of significant diagnostics from this  hospitalization (including imaging, microbiology, ancillary and laboratory) are listed below for reference.     Microbiology: Recent Results (from the past 240 hour(s))  Culture, blood (routine x 2)     Status: None (Preliminary result)   Collection Time: 11/24/20  6:53 PM   Specimen: BLOOD  Result Value Ref Range Status   Specimen Description BLOOD BLOOD LEFT FOREARM  Final   Special Requests   Final    BOTTLES DRAWN AEROBIC AND ANAEROBIC Blood Culture adequate volume   Culture   Final    NO GROWTH 4 DAYS Performed at [facility], [address], [place], [place] [address]    Report Status PENDING  Incomplete  Culture, blood (routine x 2)     Status: None (Preliminary result)   Collection Time: 11/24/20  6:58 PM   Specimen: BLOOD  Result Value Ref Range Status   Specimen Description BLOOD BLOOD RIGHT FOREARM  Final   Special Requests   Final    BOTTLES DRAWN AEROBIC AND ANAEROBIC Blood Culture adequate volume   Culture   Final    NO GROWTH 4  DAYS Performed at [facility], [place], [place] [address]    Report Status PENDING  Incomplete  SARS CORONAVIRUS 2 (TAT 6-24 HRS) Nasopharyngeal Nasopharyngeal Swab     Status: None   Collection Time: 11/24/20 10:00 PM   Specimen: Nasopharyngeal Swab  Result Value Ref Range Status   SARS Coronavirus 2 NEGATIVE NEGATIVE Final    Comment: (NOTE) SARS-CoV-2 target nucleic acids are NOT DETECTED.  The SARS-CoV-2 RNA is generally detectable in upper and lower respiratory specimens during the acute phase of infection. Negative results do not preclude SARS-CoV-2 infection, do not rule out co-infections with other pathogens, and should not be used as the sole basis for treatment or other patient management decisions. Negative results must be combined with clinical observations, patient history, and epidemiological information. The expected result is Negative.  Fact Sheet for Patients: SugarRoll.be  Fact Sheet for Healthcare Providers: https://www.woods-mathews.com/  This test is not yet approved or cleared by the [place] FDA and  has been authorized for detection and/or diagnosis of SARS-CoV-2 by FDA under an Emergency Use Authorization (EUA). This EUA will remain  in effect (meaning this test can be used) for the duration of the COVID-19 declaration under Se ction 564(b)(1) of the Act, 21 U.S.C. section 360bbb-3(b)(1), unless the authorization is terminated or revoked sooner.  Performed at [facility], [place] [address] [place], [place] [address]   MRSA PCR Screening     Status: None   Collection Time: 11/25/20  3:46 AM   Specimen: Nasopharyngeal  Result Value Ref Range Status   MRSA by PCR NEGATIVE NEGATIVE Final    Comment:        The GeneXpert MRSA Assay (FDA approved for NASAL specimens only), is one component of a comprehensive MRSA colonization surveillance program. It is not intended to  diagnose MRSA infection nor to guide or monitor treatment for MRSA infections. Performed at [facility], [address] [place], [place] [address]      Labs: BNP (last 3 results) Recent Labs    01/03/20 1628 03/08/20 1307 04/18/20 1219  BNP 1,855.0* 1,654.0* 4,628.6*   Basic Metabolic Panel: Recent Labs  Lab 11/24/20 1728 11/25/20 0346 11/26/20 0457 11/27/20 0615 11/28/20 0558  NA 138 138 138 138 139  K 3.9 3.8 3.4* 3.1* 4.0  CL 88* 92* 90* 84* 87*  CO2  35* 34* 36* 40* 39*  GLUCOSE 184* 142* 173* 134* 106*  BUN 49* 43* 41* 41* 45*  CREATININE 2.87* 2.61* 2.20* 2.52* 2.22*  CALCIUM 10.3 9.6 9.7 9.9 9.9   Liver Function Tests: Recent Labs  Lab 11/24/20 1728 11/25/20 0346  AST 44* 42*  ALT 35 30  ALKPHOS 65 58  BILITOT 2.0* 1.5*  PROT 7.1 6.1*  ALBUMIN 3.7 3.1*   No results for input(s): LIPASE, AMYLASE in the last 168 hours. No results for input(s): AMMONIA in the last 168 hours. CBC: Recent Labs  Lab 11/24/20 1728 11/25/20 0346 11/26/20 0457 11/27/20 0615 11/28/20 0558  WBC 8.1 8.0 6.5 7.2 7.7  NEUTROABS 6.3  --  4.8 5.4 5.7  HGB 15.4* 13.5 14.2 15.3* 15.5*  HCT 47.1* 42.7 43.6 48.2* 48.6*  MCV 92.2 94.3 91.8 92.7 93.3  PLT 174 146* 159 158 156   Cardiac Enzymes: No results for input(s): CKTOTAL, CKMB, CKMBINDEX, TROPONINI in the last 168 hours. BNP: Invalid input(s): POCBNP CBG: Recent Labs  Lab 11/27/20 1226 11/27/20 1624 11/27/20 2033 11/28/20 0737 11/28/20 1118  GLUCAP 192* 188* 120* 94 202*   D-Dimer No results for input(s): DDIMER in the last 72 hours. Hgb A1c No results for input(s): HGBA1C in the last 72 hours. Lipid Profile No results for input(s): CHOL, HDL, LDLCALC, TRIG, CHOLHDL, LDLDIRECT in the last 72 hours. Thyroid function studies No results for input(s): TSH, T4TOTAL, T3FREE, THYROIDAB in the last 72 hours.  Invalid input(s): FREET3 Anemia work up No results for input(s): VITAMINB12, FOLATE, FERRITIN,  TIBC, IRON, RETICCTPCT in the last 72 hours. Urinalysis    Component Value Date/Time   COLORURINE YELLOW (A) 11/24/2020 1728   APPEARANCEUR CLEAR (A) 11/24/2020 1728   LABSPEC 1.006 11/24/2020 1728   PHURINE 6.0 11/24/2020 1728   GLUCOSEU NEGATIVE 11/24/2020 1728   GLUCOSEU 100 (A) 03/21/2017 1120   HGBUR NEGATIVE 11/24/2020 1728   BILIRUBINUR NEGATIVE 11/24/2020 1728   KETONESUR NEGATIVE 11/24/2020 1728   PROTEINUR 30 (A) 11/24/2020 1728   UROBILINOGEN 0.2 03/21/2017 1120   NITRITE NEGATIVE 11/24/2020 1728   LEUKOCYTESUR NEGATIVE 11/24/2020 1728   Sepsis Labs Invalid input(s): PROCALCITONIN,  WBC,  LACTICIDVEN Microbiology Recent Results (from the past 240 hour(s))  Culture, blood (routine x 2)     Status: None (Preliminary result)   Collection Time: 11/24/20  6:53 PM   Specimen: BLOOD  Result Value Ref Range Status   Specimen Description BLOOD BLOOD LEFT FOREARM  Final   Special Requests   Final    BOTTLES DRAWN AEROBIC AND ANAEROBIC Blood Culture adequate volume   Culture   Final    NO GROWTH 4 DAYS Performed at [facility], [address], [place], [place] [address]    Report Status PENDING  Incomplete  Culture, blood (routine x 2)     Status: None (Preliminary result)   Collection Time: 11/24/20  6:58 PM   Specimen: BLOOD  Result Value Ref Range Status   Specimen Description BLOOD BLOOD RIGHT FOREARM  Final   Special Requests   Final    BOTTLES DRAWN AEROBIC AND ANAEROBIC Blood Culture adequate volume   Culture   Final    NO GROWTH 4 DAYS Performed at [facility], [address], [place], [place] [address]    Report Status PENDING  Incomplete  SARS CORONAVIRUS 2 (TAT 6-24 HRS) Nasopharyngeal Nasopharyngeal Swab     Status: None   Collection Time: 11/24/20 10:00 PM   Specimen: Nasopharyngeal Swab  Result Value Ref Range Status   SARS Coronavirus 2 NEGATIVE NEGATIVE Final    Comment: (NOTE) SARS-CoV-2 target nucleic acids are NOT  DETECTED.  The SARS-CoV-2 RNA is generally detectable in upper and lower respiratory specimens during the acute phase of infection. Negative results do not preclude SARS-CoV-2 infection, do not rule out co-infections with other pathogens, and should not be used as the sole basis for treatment or other patient management decisions. Negative results must be combined with clinical observations, patient history, and epidemiological information. The expected result is Negative.  Fact Sheet for Patients: SugarRoll.be  Fact Sheet for Healthcare Providers: https://www.woods-mathews.com/  This test is not yet approved or cleared by the [place] FDA and  has been authorized for detection and/or diagnosis of SARS-CoV-2 by FDA under an Emergency Use Authorization (EUA). This EUA will remain  in effect (meaning this test can be used) for the duration of the COVID-19 declaration under Se ction 564(b)(1) of the Act, 21 U.S.C. section 360bbb-3(b)(1), unless the authorization is terminated or revoked sooner.  Performed at [facility], [place] [address], [place], [place] [address]   MRSA PCR Screening     Status: None   Collection Time: 11/25/20  3:46 AM   Specimen: Nasopharyngeal  Result Value Ref Range Status   MRSA by PCR NEGATIVE NEGATIVE Final    Comment:        The GeneXpert MRSA Assay (FDA approved for NASAL specimens only), is one component of a comprehensive MRSA colonization surveillance program. It is not intended to diagnose MRSA infection nor to guide or monitor treatment for MRSA infections. Performed at [facility], [address] [place], [place] [address]      Time coordinating discharge: Over 30 minutes  SIGNED:   Sidney Ace, MD  [facility] 11/28/2020, 1:40 PM Pager   If 7PM-7AM, please contact night-coverage

## 2020-11-28 NOTE — Care Management Important Message (Signed)
Important Message  Patient Details  Name: Lindsay Harmon MRN: [id number] Date of Birth: 02-Dec-1955   Medicare Important Message Given:  Yes     Dannette Barbara 11/28/2020, 10:55 AM

## 2020-11-28 NOTE — Progress Notes (Signed)
Subjective:  Patient ID: Lindsay Harmon, female    DOB: May 17, 1956,  MRN: [id number]  Chief Complaint  Patient presents with  . Foot Swelling    Pt states that her foot is swollen,red and painful for a day.     65 y.o. female presents with the above complaint. Patient presents with complaint of redness swollen painful noted to the right lower leg.  She states that the right leg has gotten more red than left leg.  She does not have any ulceration.  She denies any drainage.  Her temperature in clinic is 99.2.  She has history of diabetes neuropathy and peripheral vascular disease.  However foot has been swollen to the right lower extremity for the past 1 day.  She denies any other acute complaints she has not done any treatment options.  It looks drastically different than the left leg.   Review of Systems: Negative except as noted in the HPI. Denies N/V/F/Ch.  Past Medical History:  Diagnosis Date  . Allergy   . Aortic atherosclerosis ([place])   . Arthritis   . CKD (chronic kidney disease), stage IV ([place])   . COPD (chronic obstructive pulmonary disease) ([place])    a. 11/2014 PFT's: FEV1 0.87 (38%), FVC 1.41 (48%), FEF 25-75 0.41 (19%). Unable to perform DLCO; b. 12/2017 Simple spirometry ration 67%. FEV1 1.21 L+53% predicted. FVC 1.79L 61% predicted.  . Coronary artery calcification seen on CT scan    a. 03/2017 CT Chest.  . Depression   . Diabetic neuropathy ([place])   . Erythrocytosis 12/13/2019  . Esophageal stricture   . Familial hematuria   . GERD (gastroesophageal reflux disease)   . Hyperlipidemia   . Hypertension   . IBS (irritable bowel syndrome)   . Nephrolithiasis   . NIDDM (non-insulin dependent diabetes mellitus)   . Obesity, unspecified   . Pulmonary hypertension ([place])    a. WHO Group 3; b. 10/2016 Echo: EF 55-60%, no rwma, Gr1 DD, mild to mod AS, mean grad 37mHg, mildly dil RV w/ mildly reduced RV fxn, mildly dil RA. PASP 1132mg; c. 12/2017 RHC: RA 11, RV 92/12, PA 92/33(57),  PCWP 15, Fick CO/CI 5.9/3.3. PVR 7.2 WU. Ao 93%, PA 62/64%.  . Marland KitchenVD (peripheral vascular disease) ([place]   No current facility-administered medications for this visit. No current outpatient medications on file.  Facility-Administered Medications Ordered in Other Visits:  .  acetaminophen (TYLENOL) tablet 325 mg, 325 mg, Oral, Q6H PRN, 325 mg at 11/27/20 0712 **OR** acetaminophen (TYLENOL) suppository 325 mg, 325 mg, Rectal, Q6H PRN, Cox, Amy N, DO .  albuterol (PROVENTIL) (2.5 MG/3ML) 0.083% nebulizer solution 2.5 mg, 2.5 mg, Nebulization, Q4H PRN, Cox, Amy N, DO .  albuterol (VENTOLIN HFA) 108 (90 Base) MCG/ACT inhaler 2 puff, 2 puff, Inhalation, Q4H PRN, Cox, Amy N, DO .  ALPRAZolam (XANAX) tablet 0.25 mg, 0.25 mg, Oral, Daily PRN, Cox, Amy N, DO .  arformoterol (BROVANA) nebulizer solution 15 mcg, 15 mcg, Nebulization, BID, 15 mcg at 11/28/20 0735 **AND** umeclidinium bromide (INCRUSE ELLIPTA) 62.5 MCG/INH 1 puff, 1 puff, Inhalation, Daily, Belue, NaAlver SorrowRPH, 1 puff at 11/27/20 1059 .  aspirin EC tablet 81 mg, 81 mg, Oral, Daily, Cox, Amy N, DO, 81 mg at 11/27/20 1057 .  budesonide (PULMICORT) nebulizer solution 1 mg, 1 mg, Nebulization, BID, BeRenda RollsRPH, 1 mg at 11/28/20 0735 .  calcitRIOL (ROCALTROL) capsule 0.5 mcg, 0.5 mcg, Oral, Daily, Cox, Amy N, DO, 0.5 mcg at 11/27/20 1058 .  ceFAZolin (ANCEF) IVPB 2g/100 mL premix, 2 g, Intravenous, Q12H, Nazari, Walid A, RPH, Last Rate: 200 mL/hr at 11/27/20 2123, 2 g at 11/27/20 2123 .  cholecalciferol (VITAMIN D3) tablet 1,000 Units, 1,000 Units, Oral, QHS, Cox, Amy N, DO, 1,000 Units at 11/27/20 2118 .  gabapentin (NEURONTIN) capsule 300 mg, 300 mg, Oral, q morning - 10a, 300 mg at 11/27/20 1058 **AND** gabapentin (NEURONTIN) capsule 600 mg, 600 mg, Oral, QHS, Belue, Alver Sorrow, RPH, 600 mg at 11/27/20 2118 .  heparin injection 5,000 Units, 5,000 Units, Subcutaneous, Q8H, Cox, Amy N, DO, 5,000 Units at 11/28/20 0619 .  insulin aspart  (novoLOG) injection 0-5 Units, 0-5 Units, Subcutaneous, QHS, Cox, Amy N, DO .  insulin aspart (novoLOG) injection 0-9 Units, 0-9 Units, Subcutaneous, TID WC, Cox, Amy N, DO, 2 Units at 11/27/20 1658 .  insulin glargine (LANTUS) injection 13 Units, 13 Units, Subcutaneous, Daily, Cox, Amy N, DO, 13 Units at 11/27/20 1058 .  metoprolol tartrate (LOPRESSOR) tablet 12.5 mg, 12.5 mg, Oral, BID, Cox, Amy N, DO, 12.5 mg at 11/27/20 2118 .  nitroGLYCERIN (NITROSTAT) SL tablet 0.4 mg, 0.4 mg, Sublingual, Q5 min PRN, Cox, Amy N, DO .  ondansetron (ZOFRAN) tablet 4 mg, 4 mg, Oral, Q6H PRN **OR** ondansetron (ZOFRAN) injection 4 mg, 4 mg, Intravenous, Q6H PRN, Cox, Amy N, DO .  oxyCODONE (Oxy IR/ROXICODONE) immediate release tablet 5 mg, 5 mg, Oral, Q4H PRN, Priscella Mann, Sudheer B, MD, 5 mg at 11/28/20 0001 .  pantoprazole (PROTONIX) EC tablet 40 mg, 40 mg, Oral, BID, Cox, Amy N, DO, 40 mg at 11/27/20 2118 .  pravastatin (PRAVACHOL) tablet 40 mg, 40 mg, Oral, q1800, Cox, Amy N, DO, 40 mg at 11/27/20 1657 .  Treprostinil (TYVASO) inhalation solution 18 mcg, 18 mcg, Inhalation, Q6H, Sreenath, Sudheer B, MD  Social History   Tobacco Use  Smoking Status Former Smoker  . Packs/day: 1.50  . Years: 33.00  . Pack years: 49.50  . Types: Cigarettes  . Quit date: 07/28/2005  . Years since quitting: 15.3  Smokeless Tobacco Never Used    Allergies  Allergen Reactions  . Sertraline Hcl Other (See Comments)    Altered Mental Status   Objective:  There were no vitals filed for this visit. There is no height or weight on file to calculate BMI. Constitutional Well developed. Well nourished.  Vascular Dorsalis pedis pulses palpable bilaterally. Posterior tibial pulses palpable bilaterally. Capillary refill normal to all digits.  No cyanosis or clubbing noted. Pedal hair growth normal.  Neurologic Normal speech. Oriented to person, place, and time. Epicritic sensation to light touch grossly present bilaterally.   Dermatologic  cellulitis noted up to the level of the knee from the dorsum of the foot.  No area of open wound noted.  No fluctuance noted.  No area of abscess noted.  There is swelling present 1+ pitting edema.  Orthopedic: Normal joint ROM without pain or crepitus bilaterally. No visible deformities. No bony tenderness.   Radiographs: 3 views of skeletally mature the right foot: No signs of soft tissue emphysema.  No osteomyelitis changes noted to the foot.  No other bony abnormalities identified. Assessment:   1. Pain   2. Cellulitis of right foot   3. Diabetic polyneuropathy associated with type 2 diabetes mellitus ([place])   4. PVD (peripheral vascular disease) ([place])    Plan:  Patient was evaluated and treated and all questions answered.  Right lower extremity cellulitis versus dependent rubor with a history of underlying  peripheral arterial disease and diabetes -I explained the patient the etiology of cellulitis and various treatment options were extensively discussed.  Given that this looks drastically different than the left lower extremity with redness up to the level of the knee.  Even though there is no underlying wound I discussed with the patient that this could be infectious in nature with small fissuring/cracks in the skin leading to bacteria involvement leading to cellulitis.  I believe she will benefit from IV antibiotics.  She will be sent to [facility] asked that is her local hospital for IV antibiotics.  If there is no improvement patient may need an MRI.  Unfortunately I discussed with her that I would not be able to follow-up with her while she is in the hospital.  I will be more than happy to see her after she has 48 to 72 hours of IV antibiotics and resolve meant of the redness.  I also discussed with her that this could also be attributed to dependent rubor with the peripheral vascular disease however for now we will rule out infectious cause.  She agrees with the  plan -I encouraged her to go to the emergency room at [facility] for IV antibiotics and further treatment plans.  She states understanding will do so immediately  No follow-ups on file.

## 2020-11-29 ENCOUNTER — Telehealth: Payer: Self-pay

## 2020-11-29 LAB — CULTURE, BLOOD (ROUTINE X 2)
Culture: NO GROWTH
Culture: NO GROWTH
Special Requests: ADEQUATE
Special Requests: ADEQUATE

## 2020-11-29 NOTE — Telephone Encounter (Signed)
noted

## 2020-11-29 NOTE — Telephone Encounter (Signed)
Transition Care Management Follow-up Telephone Call  Date of discharge and from where: 11/28/2020, [facility]  How have you been since you were released from the hospital? Patient states she is doing pretty good.   Any questions or concerns? No  Items Reviewed:  Did the pt receive and understand the discharge instructions provided? Yes   Medications obtained and verified? Yes   Other? No   Any new allergies since your discharge? No   Dietary orders reviewed? Yes  Do you have support at home? Yes   Home Care and Equipment/Supplies: Were home health services ordered? not applicable If so, what is the name of the agency? N/A  Has the agency set up a time to come to the patient's home? not applicable Were any new equipment or medical supplies ordered?  No What is the name of the medical supply agency? N/A Were you able to get the supplies/equipment? not applicable Do you have any questions related to the use of the equipment or supplies? No  Functional Questionnaire: (I = Independent and D = Dependent) ADLs: I  Bathing/Dressing- I  Meal Prep- I  Eating- I  Maintaining continence- I  Transferring/Ambulation- I  Managing Meds- I  Follow up appointments reviewed:   PCP Hospital f/u appt confirmed? Yes  Scheduled to see Dr. Silvio Pate on 12/07/2020 @ 3 pm.  [facility] f/u appt confirmed? N/A   Are transportation arrangements needed? No   If their condition worsens, is the pt aware to call PCP or go to the Emergency Dept.? Yes  Was the patient provided with contact information for the PCP's office or ED? Yes  Was to pt encouraged to call back with questions or concerns? Yes

## 2020-12-04 ENCOUNTER — Telehealth: Payer: Self-pay

## 2020-12-04 ENCOUNTER — Telehealth: Payer: Self-pay | Admitting: Pulmonary Disease

## 2020-12-04 NOTE — Chronic Care Management (AMB) (Addendum)
Chronic Care Management Pharmacy Assistant   Name: Lindsay Harmon  MRN: [id number] DOB: 1956-09-14  Reason for Encounter: CCM follow up appointment reminder   Patient Questions:  1.  Have you seen any other providers since your last visit? Yes 11/24/20 - ED visit - Cellulitis and abscess of left leg 11/24/20- Dr. Boneta Lucks- Podiatry- Referred to ED for IV antibiotics 11/07/20- Dr. Silvio Pate- PCP 11/06/20- Dr. Marlena Clipper- Podiatry 10/10/20- Dr. Silvio Pate- PCP- Decreased torsemide to 60 mg Oral 2 times daily, Hold afternoon dose if your weight is under 150# at home from 3 times a day.      PCP : Venia Carbon, MD  Allergies:   Allergies  Allergen Reactions   Sertraline Hcl Other (See Comments)    Altered Mental Status    Medications: Outpatient Encounter Medications as of 12/04/2020  Medication Sig   albuterol (PROVENTIL) (2.5 MG/3ML) 0.083% nebulizer solution USE 1 VIAL (3ML) BY NEBULIZATION EVERY 6HOURS AS NEEDED FOR WHEEZING OR SHORTNESS OF BREATH   ALPRAZolam (XANAX) 0.25 MG tablet Take 1 tablet (0.25 mg total) by mouth daily as needed for anxiety.   aspirin EC 81 MG tablet Take 1 tablet (81 mg total) by mouth daily.   calcitRIOL (ROCALTROL) 0.5 MCG capsule Take 0.5 mcg by mouth daily.    cholecalciferol (VITAMIN D) 1000 units tablet Take 1,000 Units by mouth at bedtime.    ferrous sulfate 325 (65 FE) MG tablet Take 1 tablet by mouth daily.   gabapentin (NEURONTIN) 300 MG capsule TAKE 1 CAPSULE BY MOUTH IN THE MORNING AND 2 CAPSULES BY MOUTH AT BEDTIME   glucose blood (ONE TOUCH ULTRA TEST) test strip USE 1 STRIP TO TEST BLOOD SUGAR TWICE DAILY DUE TO LOW BLOOD SUGAR. TAKES LANTUS INSULIN Dx Code E11.51   insulin glargine (LANTUS SOLOSTAR) 100 UNIT/ML Solostar Pen Inject 15 Units into the skin daily.   Insulin Pen Needle (B-D UF III MINI PEN NEEDLES) 31G X 5 MM MISC USE 1 PEN NEEDLE TO INJECT INSULIN   loratadine (CLARITIN) 10 MG tablet Take 10 mg by mouth 2 (two) times daily.    lovastatin (MEVACOR) 40 MG tablet Take 2 tablets (80 mg total) by mouth at bedtime.   methadone (DOLOPHINE) 10 MG tablet TAKE 2 TABLETS BY MOUTH EVERY 6 HOURS ASNEEDED   metolazone (ZAROXOLYN) 2.5 MG tablet TAKE 1 TABLET BY MOUTH ONCE A WEEK EVERYTUESDAY   metoprolol tartrate (LOPRESSOR) 25 MG tablet Take 1/2 tablet by mouth twice a day   mometasone (ASMANEX, 120 METERED DOSES,) 220 MCG/INH inhaler Inhale 2 puffs into the lungs 2 (two) times daily.   nitroGLYCERIN (NITROSTAT) 0.4 MG SL tablet Place 1 tablet (0.4 mg total) under the tongue every 5 (five) minutes as needed for chest pain.   pantoprazole (PROTONIX) 40 MG tablet Take 1 tablet by mouth twice a day   potassium chloride SA (KLOR-CON) 20 MEQ tablet Take 2 tablets (40 mEq total) by mouth once a week. Every Tuesday   PROAIR HFA 108 (90 Base) MCG/ACT inhaler USE 2 PUFFS EVERY 6 HOURS AS NEEDED FOR WHEEZING OR SHORTNESS OF BREATH   Respiratory Therapy Supplies (FLUTTER) DEVI Use as directed   STIOLTO RESPIMAT 2.5-2.5 MCG/ACT AERS INHALE 2 PUFFS BY MOUTH INTO THE LUNGS DAILY   torsemide (DEMADEX) 20 MG tablet Take 3 tablets (60 mg total) by mouth 2 (two) times daily. Hold afternoon dose if your weight is under 150# at home   Treprostinil (TYVASO) 0.6 MG/ML SOLN Inhale 18  mcg into the lungs 4 (four) times daily.   No facility-administered encounter medications on file as of 12/04/2020.    Current Diagnosis: Patient Active Problem List   Diagnosis Date Noted   Lower extremity cellulitis 11/25/2020   Cellulitis and abscess of left leg 11/24/2020   Bursitis of left elbow 05/05/2020   Right hip pain 05/05/2020   Pre-ulcerative calluses 04/10/2020   Pain due to onychomycosis of toenails of both feet 01/13/2020   Porokeratosis 01/13/2020   Erythrocytosis 12/13/2019   Senile purpura ([place]) 09/27/2019   Foot pain, bilateral 03/03/2019   Aortic atherosclerosis ([place])    Narcotic dependence ([place]) 07/23/2017   Encounter for chronic pain  management 03/21/2017   Cor pulmonale, chronic (HCC)    Pulmonary hypertension ([place])    RVF (right ventricular failure) ([place]) 01/17/2015   Chronic respiratory failure with hypoxia ([place]) 12/15/2014   COPD (chronic obstructive pulmonary disease) ([place]) 12/15/2014   Chronic diastolic heart failure ([place]) 12/05/2014   Chronic renal disease, stage IV ([place]) 12/01/2014   Advance directive discussed with patient 11/08/2014   Diabetes, polyneuropathy ([place]) 09/15/2013   Routine general medical examination at a health care facility 08/13/2011   Atherosclerotic heart disease of native coronary artery with angina pectoris ([place]) 02/27/2007   Chronic back pain 02/27/2007   Type 2 diabetes mellitus with neurological manifestations, controlled ([place]) 02/26/2007   Episodic mood disorder ([place]) 02/26/2007   Essential hypertension, benign 02/26/2007   PVD (peripheral vascular disease) ([place]) 02/26/2007   GERD 02/26/2007   DEGENERATIVE JOINT DISEASE 02/26/2007     Contacted [name] ahead of their visit with CPP, Debbora Dus, Pharm. D to review any health or medication changes. Patient states she had cellulitis recently on her left leg. Finished course of antibiotics. States leg is looking better. Still some pain.   Are you having any problems with your medications? No  Any concerns she would like to discuss with the pharmacist? States she can not remember right now but will write them down when she thinks of them.   Patient reminded to have all medications, supplements and any blood sugar and blood pressure readings available for review with Debbora Dus, Pharm. D, at their telephone visit on 12/06/20 at 1:00   Follow-Up:  Pharmacist Review  Debbora Dus, CPP notified  Margaretmary Dys, [place] Assistant [phone]  I have reviewed the care management and care coordination activities outlined in this encounter and I am certifying that I agree with the content of this note.  No further action required.  Debbora Dus, PharmD Clinical Pharmacist [facility] [phone]

## 2020-12-05 ENCOUNTER — Other Ambulatory Visit (HOSPITAL_COMMUNITY): Payer: Self-pay | Admitting: *Deleted

## 2020-12-05 MED ORDER — POTASSIUM CHLORIDE CRYS ER 20 MEQ PO TBCR: 40.0000 meq | EXTENDED_RELEASE_TABLET | ORAL | 3 refills | Status: AC

## 2020-12-05 MED ORDER — METOLAZONE 2.5 MG PO TABS: ORAL_TABLET | ORAL | 3 refills | Status: DC

## 2020-12-05 NOTE — Telephone Encounter (Signed)
Nothing noted in message. Will close encounter.  

## 2020-12-06 ENCOUNTER — Other Ambulatory Visit: Payer: Self-pay

## 2020-12-06 ENCOUNTER — Telehealth: Payer: Self-pay | Admitting: Primary Care

## 2020-12-06 ENCOUNTER — Ambulatory Visit (INDEPENDENT_AMBULATORY_CARE_PROVIDER_SITE_OTHER): Payer: PPO

## 2020-12-06 ENCOUNTER — Telehealth: Payer: Self-pay | Admitting: Pulmonary Disease

## 2020-12-06 DIAGNOSIS — E1149 Type 2 diabetes mellitus with other diabetic neurological complication: Secondary | ICD-10-CM

## 2020-12-06 DIAGNOSIS — I5032 Chronic diastolic (congestive) heart failure: Secondary | ICD-10-CM | POA: Diagnosis not present

## 2020-12-06 MED ORDER — STIOLTO RESPIMAT 2.5-2.5 MCG/ACT IN AERS: INHALATION_SPRAY | RESPIRATORY_TRACT | 0 refills | Status: DC

## 2020-12-06 MED ORDER — ASMANEX (120 METERED DOSES) 220 MCG/INH IN AEPB: 2.0000 | INHALATION_SPRAY | Freq: Two times a day (BID) | RESPIRATORY_TRACT | 0 refills | Status: AC

## 2020-12-06 MED ORDER — PROAIR HFA 108 (90 BASE) MCG/ACT IN AERS: INHALATION_SPRAY | RESPIRATORY_TRACT | 0 refills | Status: DC

## 2020-12-06 NOTE — Telephone Encounter (Signed)
Called and spoke with Danielle. She stated that section H of the patient's application was not complete. I have asked her to fax a copy of the application back to [place] as the application was not in his inbox nor scanned into her chart. She will fax the application now. Will await fax.

## 2020-12-06 NOTE — Progress Notes (Addendum)
Chronic Care Management Pharmacy Note  12/14/2020 Name:  Lindsay Harmon MRN:  [id number] DOB:  [birth year]  Subjective: [name] is an 65 y.o. year old female who is a primary patient of Letvak, Theophilus Kinds, MD.  The CCM team was consulted for assistance with disease management and care coordination needs.    Engaged with patient by telephone for follow up visit in response to provider referral for pharmacy case management and/or care coordination services.   Consent to Services:  The patient was given information about Chronic Care Management services, agreed to services, and gave verbal consent prior to initiation of services.  Please see initial visit note for detailed documentation.   Patient Care Team: Venia Carbon, MD as PCP - General Bensimhon, Shaune Pascal, MD as PCP - Cardiology (Cardiology) Bensimhon, Shaune Pascal, MD as Consulting Physician (Cardiology) Juanito Doom, MD as Consulting Physician (Pulmonary Disease) Anthonette Legato, MD as Consulting Physician (Internal Medicine) Lupita Raider, DO as Consulting Physician (Optometry) Earlie Server, MD as Consulting Physician (Hematology and Oncology) Debbora Dus, [facility] as Pharmacist (Pharmacist) Earlie Server, MD as Consulting Physician (Oncology)  Recent office visits:  11/07/20 - Silvio Pate - Cont current meds, doing well  10/10/20 Silvio Pate - Please take a second dose of 28m torsemide (3-4 hours after the first) on any day your home weight is 150# or higher.  05/05/20:Silvio Pate- right hip pain, try heat/tylenol, bursitis left elbow inflamed, ice, tylenol, if worsens, antibiotic, refill methadone 10 mg TID-QID  02/02/20: LSilvio Pate- cont current meds   Consult Visit:  11/24/20 - Podiatry   04/18/20: CHF - weight is down 13 lbs, continue torsemide 60 mg daily, metolazone 2.5 mg and Kdur 40 once weekly  03/08/20: CHF - volume overload, add metolazone 2.5 mg and Kdur 40 every Tuesday, check BMP today and again in 2 weeks   02/29/20:  Cardiology - PAD progressing, repeat LE arterial doppler, CHF exacerbation March 2021 - current mild volume overload  01/11/20: Pulmonary - recent hospitalization for hypoxemic resp. failure, improving, cont current meds, rtc 3 months  12/10/19: Pulmonary HTN - try flutter valve for congestion/loosen secretions  Hospital visits:  11/24/20 - ED/Cellulitis - improving,still some pain   Objective:  Lab Results  Component Value Date   CREATININE 2.22 (H) 11/28/2020   BUN 45 (H) 11/28/2020   GFR 22.34 (L) 10/27/2019   GFRNONAA 24 (L) 11/28/2020   GFRAA 23 (L) 04/18/2020   NA 139 11/28/2020   K 4.0 11/28/2020   CALCIUM 9.9 11/28/2020   CO2 39 (H) 11/28/2020    Lab Results  Component Value Date/Time   HGBA1C 7.4 (H) 11/25/2020 02:43 AM   HGBA1C 6.5 (A) 08/02/2020 12:45 PM   HGBA1C 8.3 (H) 01/03/2020 10:32 PM   HGBA1C 7.0 03/19/2019 12:00 AM   GFR 22.34 (L) 10/27/2019 03:54 PM   GFR 22.25 (L) 06/17/2019 11:54 AM   MICROALBUR 19121mg creat 12/12/2015 12:00 AM   MICROALBUR 0.8 09/08/2012 09:33 AM    Last diabetic Eye exam:  Lab Results  Component Value Date/Time   HMDIABEYEEXA No Retinopathy 08/28/2020 12:00 AM    Last diabetic Foot exam:  Lab Results  Component Value Date/Time   HMDIABFOOTEX done 09/18/2018 12:00 AM     Lab Results  Component Value Date   CHOL 143 01/03/2020   HDL 58 01/03/2020   LDLCALC 73 01/03/2020   LDLDIRECT 65.0 10/23/2016   TRIG 59 01/03/2020   CHOLHDL 2.5 01/03/2020  Hepatic Function Latest Ref Rng & Units 11/25/2020 11/24/2020 10/27/2019  Total Protein 6.5 - 8.1 g/dL 6.1(L) 7.1 6.8  Albumin 3.5 - 5.0 g/dL 3.1(L) 3.7 4.1  AST 15 - 41 U/L 42(H) 44(H) 27  ALT 0 - 44 U/L 30 35 22  Alk Phosphatase 38 - 126 U/L 58 65 71  Total Bilirubin 0.3 - 1.2 mg/dL 1.5(H) 2.0(H) 0.6  Bilirubin, Direct 0.0 - 0.3 mg/dL - - -    Lab Results  Component Value Date/Time   TSH 8.149 (H) 11/25/2020 02:43 AM   TSH 1.613 01/03/2020 10:32 PM   TSH 3.45  10/27/2019 03:54 PM   TSH 1.67 09/15/2013 11:19 AM   FREET4 1.14 11/08/2014 11:32 AM    CBC Latest Ref Rng & Units 11/28/2020 11/27/2020 11/26/2020  WBC 4.0 - 10.5 K/uL 7.7 7.2 6.5  Hemoglobin 12.0 - 15.0 g/dL 15.5(H) 15.3(H) 14.2  Hematocrit 36.0 - 46.0 % 48.6(H) 48.2(H) 43.6  Platelets 150 - 400 K/uL 156 158 159    Lab Results  Component Value Date/Time   VD25OH 51.7 04/20/2018 03:47 AM    Clinical ASCVD: Yes  The 10-year ASCVD risk score Mikey Bussing DC Jr., et al., 2013) is: 10.2%   Values used to calculate the score:     Age: 41 years     Sex: Female     Is Non-Hispanic African American: No     Diabetic: Yes     Tobacco smoker: No     Systolic Blood Pressure: 161 mmHg     Is BP treated: Yes     HDL Cholesterol: 58 mg/dL     Total Cholesterol: 143 mg/dL    Depression screen [facility] 2/9 09/18/2018 05/14/2018  Decreased Interest 0 0  Down, Depressed, Hopeless 0 0  PHQ - 2 Score 0 0  Some recent data might be hidden    Social History   Tobacco Use  Smoking Status Former Smoker  . Packs/day: 1.50  . Years: 33.00  . Pack years: 49.50  . Types: Cigarettes  . Quit date: 07/28/2005  . Years since quitting: 15.3  Smokeless Tobacco Never Used   BP Readings from Last 3 Encounters:  12/07/20 (!) 106/58  11/28/20 106/72  11/07/20 116/72   Pulse Readings from Last 3 Encounters:  12/07/20 78  11/28/20 90  11/07/20 77   Wt Readings from Last 3 Encounters:  12/07/20 142 lb (64.4 kg)  11/24/20 150 lb (68 kg)  11/07/20 153 lb 12.8 oz (69.8 kg)    Assessment/Interventions: Review of patient past medical history, allergies, medications, health status, including review of consultants reports, laboratory and other test data, was performed as part of comprehensive evaluation and provision of chronic care management services.   SDOH:  (Social Determinants of Health) assessments and interventions performed: Yes SDOH Interventions   Flowsheet Row Most Recent Value  SDOH Interventions    Financial Strain Interventions Intervention Not Indicated  [Medications affordable]      CCM Care Plan  Allergies  Allergen Reactions  . Sertraline Hcl Other (See Comments)    Altered Mental Status   Current Outpatient Medications on File Prior to Visit  Medication Sig Dispense Refill  . albuterol (PROVENTIL) (2.5 MG/3ML) 0.083% nebulizer solution USE 1 VIAL (3ML) BY NEBULIZATION EVERY 6HOURS AS NEEDED FOR WHEEZING OR SHORTNESS OF BREATH 360 mL 0  . ALPRAZolam (XANAX) 0.25 MG tablet Take 1 tablet (0.25 mg total) by mouth daily as needed for anxiety. 30 tablet 0  . aspirin EC 81 MG  tablet Take 1 tablet (81 mg total) by mouth daily. 90 tablet 3  . calcitRIOL (ROCALTROL) 0.5 MCG capsule Take 0.5 mcg by mouth daily.     . cholecalciferol (VITAMIN D) 1000 units tablet Take 1,000 Units by mouth at bedtime.     . ferrous sulfate 325 (65 FE) MG tablet Take 1 tablet by mouth daily.    Marland Kitchen glucose blood (ONE TOUCH ULTRA TEST) test strip USE 1 STRIP TO TEST BLOOD SUGAR TWICE DAILY DUE TO LOW BLOOD SUGAR. TAKES LANTUS INSULIN Dx Code E11.51 200 each 3  . insulin glargine (LANTUS SOLOSTAR) 100 UNIT/ML Solostar Pen Inject 15 Units into the skin daily. (Patient taking differently: Inject 13 Units into the skin daily.) 1 mL 0  . Insulin Pen Needle (B-D UF III MINI PEN NEEDLES) 31G X 5 MM MISC USE 1 PEN NEEDLE TO INJECT INSULIN 100 each 5  . loratadine (CLARITIN) 10 MG tablet Take 10 mg by mouth 2 (two) times daily.    . methadone (DOLOPHINE) 10 MG tablet TAKE 2 TABLETS BY MOUTH EVERY 6 HOURS ASNEEDED 240 tablet 0  . metoprolol tartrate (LOPRESSOR) 25 MG tablet Take 1/2 tablet by mouth twice a day 90 tablet 3  . nitroGLYCERIN (NITROSTAT) 0.4 MG SL tablet Place 1 tablet (0.4 mg total) under the tongue every 5 (five) minutes as needed for chest pain. 90 tablet 1  . pantoprazole (PROTONIX) 40 MG tablet Take 1 tablet by mouth twice a day 180 tablet 3  . potassium chloride SA (KLOR-CON) 20 MEQ tablet Take 2  tablets (40 mEq total) by mouth once a week. Every Tuesday (Patient taking differently: Take 40 mEq by mouth once a week. Every Friday) 90 tablet 3  . Respiratory Therapy Supplies (FLUTTER) DEVI Use as directed 1 each 0  . torsemide (DEMADEX) 20 MG tablet Take 3 tablets (60 mg total) by mouth 2 (two) times daily. Hold afternoon dose if your weight is under 150# at home 540 tablet 0  . Treprostinil (TYVASO) 0.6 MG/ML SOLN Inhale 18 mcg into the lungs 4 (four) times daily.     No current facility-administered medications on file prior to visit.    Patient Active Problem List   Diagnosis Date Noted  . Lower extremity cellulitis 11/25/2020  . Cellulitis of right leg 11/24/2020  . Bursitis of left elbow 05/05/2020  . Right hip pain 05/05/2020  . Pre-ulcerative calluses 04/10/2020  . Pain due to onychomycosis of toenails of both feet 01/13/2020  . Porokeratosis 01/13/2020  . Erythrocytosis 12/13/2019  . Senile purpura ([place]) 09/27/2019  . Foot pain, bilateral 03/03/2019  . Aortic atherosclerosis ([place])   . Narcotic dependence ([place]) 07/23/2017  . Encounter for chronic pain management 03/21/2017  . Cor pulmonale, chronic ([place])   . Pulmonary hypertension ([place])   . RVF (right ventricular failure) ([place]) 01/17/2015  . Chronic respiratory failure with hypoxia ([place]) 12/15/2014  . COPD (chronic obstructive pulmonary disease) ([place]) 12/15/2014  . Chronic diastolic heart failure ([place]) 12/05/2014  . Chronic renal disease, stage IV ([place]) 12/01/2014  . Advance directive discussed with patient 11/08/2014  . Diabetes, polyneuropathy ([place]) 09/15/2013  . Routine general medical examination at a health care facility 08/13/2011  . Atherosclerotic heart disease of native coronary artery with angina pectoris ([place]) 02/27/2007  . Chronic back pain 02/27/2007  . Type 2 diabetes mellitus with neurological manifestations, controlled ([place]) 02/26/2007  . Episodic mood disorder ([place]) 02/26/2007  . Essential  hypertension, benign 02/26/2007  . PVD (peripheral vascular disease) (  [place]) 02/26/2007  . GERD 02/26/2007  . DEGENERATIVE JOINT DISEASE 02/26/2007    Immunization History  Administered Date(s) Administered  . Fluad Quad(high Dose 65+) 06/17/2019, 08/02/2020  . Influenza Split 08/13/2011  . Influenza Whole 09/16/2007, 08/17/2009, 07/31/2010  . Influenza, High Dose Seasonal PF 06/17/2019  . Influenza,inj,Quad PF,6+ Mos 07/12/2013, 08/09/2014, 08/10/2015, 07/23/2016, 07/23/2017, 06/19/2018  . PFIZER(Purple Top)SARS-COV-2 Vaccination 12/15/2019, 12/29/2019, 08/10/2020  . Pneumococcal Conjugate-13 11/08/2014  . Pneumococcal Polysaccharide-23 08/05/2002, 02/27/2016  . Td 05/19/2006  . Tdap 04/26/2017  . Zoster 11/23/2014    Conditions to be addressed/monitored:  Diabetes and Heart Failure  Care Plan : [place]  Updates made by Debbora Dus, [facility] since 12/14/2020 12:00 AM    Problem: Diabetes and Heart Failure   Priority: High  Onset Date: 12/06/2020  Note:    Current Barriers:  . None identified  Pharmacist Clinical Goal(s):  Marland Kitchen Over the next 30 days, patient will maintain control of diabates and heart failure as evidenced by A1c < 8% and prevent fluid overload by maintaining stable weight and taking torsemide as prescribed  through collaboration with PharmD and provider.   Interventions: . 1:1 collaboration with Venia Carbon, MD regarding development and update of comprehensive plan of care as evidenced by provider attestation and co-signature . Inter-disciplinary care team collaboration (see longitudinal plan of care) . Comprehensive medication review performed; medication list updated in electronic medical record  Diabetes (A1c goal <8%) -controlled - Due to renal impairment and frequent lows, targeting higher A1c goal of < 8%. -Current medications: . Lantus - Inject 13 units daily -Medications previously tried: metformin - CKD -Current home glucose  readings - checking BG every morning  Fasting: 215 (missed insulin yesterday), 180, 190, 165, 167, 139, 135, 142, 98, 172, 123, 138, 158, 100, 141, 124, 113, 113, 104, 85, 104, 127, 79   (Reports BG has been a little higher the past 3-4 days, no diet changes, missed insulin yesterday (this is rare). Has been home from hospital since Tuesday (2/1) last week, finished antibiotics no steroids given.) -Denies hypoglycemic/hyperglycemic symptoms -Current meal patterns:   Denies any change in diet; eats breakfast daily, snack at lunch (PB crackers, fruit) and supper; eats at home mostly - loves fresh vegetables, pork or chicken, baked potato, green peas; denies bedtime snacking; drinks - water, tea with artificial sweetener -Educated onA1c and blood sugar goals; Prevention and management of hypoglycemic episodes; -Counseled to check feet daily and get yearly eye exams -Recommended to continue current medication; Instructed to call if fasting BG remains elevated above 150.  Heart Failure (Goal: control symptoms and prevent exacerbations) controlled Type: Diastolic -NYHA Class: II (slight limitation of activity) -Ejection fraction:60-65% (Date: 01/04/20) -Current treatment:  Metoprolol tartrate 25 mg - 1/2 tablet BID   Torsemide 20 mg - 3 tablets daily with 3 additional tablets if weight over 150  Metolazone 2.5 mg - 1 tablet every Friday  Potassium chloride SA 20 mEq - 2 tablets with metolazone every Friday -Medications previously tried: none reported -Current home BP/HR readings: none reported -Current weight is down from 152.6 on 11/24/20 to current range 135-139 lbs. Denies any swelling. She weighs herself daily but only takes extra torsemide 60 mg if weight over 150 per Dr Alla German recommendation. Reports she used her rescue inhaler yesterday and this helped. Denies recent use of nebulizer. -Educated on: Call heart clinic if she gains > 3 lbs overnight or 5 lbs in 7 days  -Recommended to  continue current medication  Patient Goals/Self-Care Activities . Over the next 30 days, patient will:  - take medications as prescribed - weigh daily, and contact provider if weight gain of 3 pounds in 24 hours or 5 pounds in 1 week  - call if fasting BG remains above 150 daily     Medication Adherence Review: - Very rarely takes Xanax (less than once every 3 months), this is PRN - Reports she took nitroglycerin since she came home from hospital and chest pain improved. No episodes since then. - Denies med changes upon hospital discharge. - Confirms all other medications as prescribed.  Medication Assistance: None required.  Patient affirms current coverage meets needs. She has LIS. [place] - she was getting it through [employer] but this lapsed. Looked into other options for her, all other foundations currently closed ([employer], [employer]). Per chart the pulmonary office is working on re-faxing her paperwork for renewal to [employer] and patient aware.  Patient's preferred pharmacy is: [facility]) - [place], [place] [address] Phone: [phone] Fax: [phone]  Uses pill box? Yes  Pt endorses 100% compliance, denies any missed doses. She gets all meds from Mail Order except urgent or short term from any local pharmacy.  Follow Up:  Patient agrees to Care Plan and Follow-up.  Plan: Telephone follow up appointment with care management team member scheduled for:  June 07, 2021 at 2 PM (telephone)  Debbora Dus, PharmD Clinical Pharmacist [facility] [phone]     Encounter details: CCM Time Spent      Value Time User   Time spent with patient (minutes)  90 12/14/2020 12:11 PM Debbora Dus, [facility]   Time spent performing Chart review  30 12/14/2020 12:11 PM Debbora Dus, [place]   Total time (minutes)  120 12/14/2020 12:11 PM Debbora Dus, RPH      Moderate to High Complex Decision Making      Value Time User   Moderate to High complex decision making  Yes 12/14/2020 12:11 PM Debbora Dus, [facility]     CCM Services: --ROutine  I have personally reviewed this encounter including the documentation in this note and have collaborated with the care management provider regarding care management and care coordination activities to include development and update of the comprehensive care plan. I am certifying that I agree with the content of this note and encounter as supervising physician.

## 2020-12-06 NOTE — Telephone Encounter (Signed)
Called and spoke with pt and she is aware of refills that have been sent to the mail order pharmacy.

## 2020-12-07 ENCOUNTER — Other Ambulatory Visit (HOSPITAL_COMMUNITY): Payer: Self-pay | Admitting: *Deleted

## 2020-12-07 ENCOUNTER — Encounter: Payer: Self-pay | Admitting: Internal Medicine

## 2020-12-07 ENCOUNTER — Ambulatory Visit (INDEPENDENT_AMBULATORY_CARE_PROVIDER_SITE_OTHER): Payer: PPO | Admitting: Internal Medicine

## 2020-12-07 ENCOUNTER — Other Ambulatory Visit: Payer: Self-pay

## 2020-12-07 DIAGNOSIS — I739 Peripheral vascular disease, unspecified: Secondary | ICD-10-CM

## 2020-12-07 DIAGNOSIS — I5032 Chronic diastolic (congestive) heart failure: Secondary | ICD-10-CM

## 2020-12-07 DIAGNOSIS — N184 Chronic kidney disease, stage 4 (severe): Secondary | ICD-10-CM | POA: Diagnosis not present

## 2020-12-07 DIAGNOSIS — L03115 Cellulitis of right lower limb: Secondary | ICD-10-CM

## 2020-12-07 DIAGNOSIS — E1149 Type 2 diabetes mellitus with other diabetic neurological complication: Secondary | ICD-10-CM

## 2020-12-07 MED ORDER — METOLAZONE 2.5 MG PO TABS: ORAL_TABLET | ORAL | 3 refills | Status: AC

## 2020-12-07 NOTE — Assessment & Plan Note (Signed)
Weight actually down and seems compensated Metolazone and torsemide Also metoprolol

## 2020-12-07 NOTE — Progress Notes (Signed)
Subjective:    Patient ID: Lindsay Harmon, female    DOB: 02-Sep-1956, 65 y.o.   MRN: [id number]  HPI Here with sister for hospital follow up This visit occurred during the SARS-CoV-2 public health emergency.  Safety protocols were in place, including screening questions prior to the visit, additional usage of staff PPE, and extensive cleaning of exam room while observing appropriate contact time as indicated for disinfecting solutions.   Reviewed hospital records and discharge summary Started with redness in left calf---all the way to toes Significant pain---progressed rapidly No trauma or insect bites. No ulcers (but does have chronic callous) Was seen in podiatry office and sent to ER  Diagnosed with cellulitis---IV antibiotic Sent home on augmentin No home therapy started--like PT/OT Will still look dark--especially if down--but is better Occasional shooting pains  Evaluation of right leg pain ABI is abnormal ---on right Told to consider a vascular evaluation  Renal function was stable No changes in regimen for pulmonary hypertension  No changes in medications--other than PM aspirin changed to morning Sugars up some in past few days  165-215----but 79-155 before the hospital  Weight down about 10# since in hospital  Current Outpatient Medications on File Prior to Visit  Medication Sig Dispense Refill  . albuterol (PROVENTIL) (2.5 MG/3ML) 0.083% nebulizer solution USE 1 VIAL (3ML) BY NEBULIZATION EVERY 6HOURS AS NEEDED FOR WHEEZING OR SHORTNESS OF BREATH 360 mL 0  . ALPRAZolam (XANAX) 0.25 MG tablet Take 1 tablet (0.25 mg total) by mouth daily as needed for anxiety. 30 tablet 0  . aspirin EC 81 MG tablet Take 1 tablet (81 mg total) by mouth daily. 90 tablet 3  . calcitRIOL (ROCALTROL) 0.5 MCG capsule Take 0.5 mcg by mouth daily.     . cholecalciferol (VITAMIN D) 1000 units tablet Take 1,000 Units by mouth at bedtime.     . ferrous sulfate 325 (65 FE) MG tablet Take 1 tablet  by mouth daily.    Marland Kitchen gabapentin (NEURONTIN) 300 MG capsule TAKE 1 CAPSULE BY MOUTH IN THE MORNING AND 2 CAPSULES BY MOUTH AT BEDTIME 270 capsule 3  . glucose blood (ONE TOUCH ULTRA TEST) test strip USE 1 STRIP TO TEST BLOOD SUGAR TWICE DAILY DUE TO LOW BLOOD SUGAR. TAKES LANTUS INSULIN Dx Code E11.51 200 each 3  . insulin glargine (LANTUS SOLOSTAR) 100 UNIT/ML Solostar Pen Inject 15 Units into the skin daily. (Patient taking differently: Inject 13 Units into the skin daily.) 1 mL 0  . Insulin Pen Needle (B-D UF III MINI PEN NEEDLES) 31G X 5 MM MISC USE 1 PEN NEEDLE TO INJECT INSULIN 100 each 5  . loratadine (CLARITIN) 10 MG tablet Take 10 mg by mouth 2 (two) times daily.    Marland Kitchen lovastatin (MEVACOR) 40 MG tablet Take 2 tablets (80 mg total) by mouth at bedtime. 180 tablet 3  . methadone (DOLOPHINE) 10 MG tablet TAKE 2 TABLETS BY MOUTH EVERY 6 HOURS ASNEEDED 240 tablet 0  . metoprolol tartrate (LOPRESSOR) 25 MG tablet Take 1/2 tablet by mouth twice a day 90 tablet 3  . mometasone (ASMANEX, 120 METERED DOSES,) 220 MCG/INH inhaler Inhale 2 puffs into the lungs 2 (two) times daily. 3 each 0  . nitroGLYCERIN (NITROSTAT) 0.4 MG SL tablet Place 1 tablet (0.4 mg total) under the tongue every 5 (five) minutes as needed for chest pain. 90 tablet 1  . pantoprazole (PROTONIX) 40 MG tablet Take 1 tablet by mouth twice a day 180 tablet 3  .  potassium chloride SA (KLOR-CON) 20 MEQ tablet Take 2 tablets (40 mEq total) by mouth once a week. Every Tuesday (Patient taking differently: Take 40 mEq by mouth once a week. Every Friday) 90 tablet 3  . PROAIR HFA 108 (90 Base) MCG/ACT inhaler USE 2 PUFFS EVERY 6 HOURS AS NEEDED FOR WHEEZING OR SHORTNESS OF BREATH 3 each 0  . Respiratory Therapy Supplies (FLUTTER) DEVI Use as directed 1 each 0  . Tiotropium Bromide-Olodaterol (STIOLTO RESPIMAT) 2.5-2.5 MCG/ACT AERS INHALE 2 PUFFS BY MOUTH INTO THE LUNGS DAILY 3 each 0  . torsemide (DEMADEX) 20 MG tablet Take 3 tablets (60 mg  total) by mouth 2 (two) times daily. Hold afternoon dose if your weight is under 150# at home 540 tablet 0  . Treprostinil (TYVASO) 0.6 MG/ML SOLN Inhale 18 mcg into the lungs 4 (four) times daily.     No current facility-administered medications on file prior to visit.    Allergies  Allergen Reactions  . Sertraline Hcl Other (See Comments)    Altered Mental Status    Past Medical History:  Diagnosis Date  . Allergy   . Aortic atherosclerosis ([place])   . Arthritis   . CKD (chronic kidney disease), stage IV ([place])   . COPD (chronic obstructive pulmonary disease) ([place])    a. 11/2014 PFT's: FEV1 0.87 (38%), FVC 1.41 (48%), FEF 25-75 0.41 (19%). Unable to perform DLCO; b. 12/2017 Simple spirometry ration 67%. FEV1 1.21 L+53% predicted. FVC 1.79L 61% predicted.  . Coronary artery calcification seen on CT scan    a. 03/2017 CT Chest.  . Depression   . Diabetic neuropathy ([place])   . Erythrocytosis 12/13/2019  . Esophageal stricture   . Familial hematuria   . GERD (gastroesophageal reflux disease)   . Hyperlipidemia   . Hypertension   . IBS (irritable bowel syndrome)   . Nephrolithiasis   . NIDDM (non-insulin dependent diabetes mellitus)   . Obesity, unspecified   . Pulmonary hypertension ([place])    a. WHO Group 3; b. 10/2016 Echo: EF 55-60%, no rwma, Gr1 DD, mild to mod AS, mean grad 49mHg, mildly dil RV w/ mildly reduced RV fxn, mildly dil RA. PASP 1158mg; c. 12/2017 RHC: RA 11, RV 92/12, PA 92/33(57), PCWP 15, Fick CO/CI 5.9/3.3. PVR 7.2 WU. Ao 93%, PA 62/64%.  . Marland KitchenVD (peripheral vascular disease) ([place]    Past Surgical History:  Procedure Laterality Date  . ABDOMINAL HYSTERECTOMY    . CARDIAC CATHETERIZATION    . CARPAL TUNNEL RELEASE    . CESAREAN SECTION    . ERCP N/A 03/17/2017   Procedure: ENDOSCOPIC RETROGRADE CHOLANGIOPANCREATOGRAPHY (ERCP);  Surgeon: WoLucilla LameMD;  [facility];  Service: Endoscopy;  Laterality: N/A;  . EUS N/A 03/13/2017   Procedure: FULL UPPER  ENDOSCOPIC ULTRASOUND (EUS) RADIAL;  Surgeon: Burbridge, ReMurray HodgkinsMD;  Location: [facility]COPY;  Service: Endoscopy;  Laterality: N/A;  . RIGHT HEART CATH N/A 01/22/2018   Procedure: RIGHT HEART CATH;  Surgeon: BeJolaine ArtistMD;  [facility];  Service: Cardiovascular;  Laterality: N/A;  . RIGHT HEART CATHETERIZATION N/A 12/05/2014   Procedure: RIGHT HEART CATH;  Surgeon: HeSinclair GroomsMD;  [facility];  Service: Cardiovascular;  Laterality: N/A;    Family History  Problem Relation Age of Onset  . Diabetes Mother   . Cancer Mother        ovarian,melanoma  . Allergies Father   . Cancer Father  lung  . Cancer Maternal Grandmother        uterine  . Emphysema Paternal Grandfather   . Allergies Sister   . Breast cancer Neg Hx     Social History   Socioeconomic History  . Marital status: Legally Separated    Spouse name: Not on file  . Number of children: 1  . Years of education: Not on file  . Highest education level: Not on file  Occupational History  . 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 Smoking status: Former Smoker    Packs/day: 1.50    Years: 33.00    Pack years: 49.50    Types: Cigarettes    Quit date: 07/28/2005    Years since quitting: 15.3  . Smokeless tobacco: Never Used  Vaping Use  . Vaping Use: Never used  Substance and Sexual Activity  . Alcohol use: No    Alcohol/week: 0.0 standard drinks    Comment: heavy in the past  . Drug use: No  . Sexual activity: Never  Other Topics Concern  . Not on file  Social History Narrative   No living will   Son [name] (then mom) should make decisions for her if she is unable.    Would accept resuscitation attempts but no prolonged ventilation   Not sure about tube feeds but probably wouldn't want them if cognitively unaware   Social Determinants of Health   Financial Resource Strain: Low Risk   . Difficulty of Paying  Living Expenses: Not very hard  Food Insecurity: Not on file  Transportation Needs: Not on file  Physical Activity: Not on file  Stress: Not on file  Social Connections: Not on file  Intimate Partner Violence: Not on file   Review of Systems Sleeping okay Appetite is fine     Objective:   Physical Exam Constitutional:      Appearance: Normal appearance.  Cardiovascular:     Rate and Rhythm: Normal rate and regular rhythm.     Comments: Pedal pulses not palpated Coarse 3/6 systolic murmur Pulmonary:     Effort: Pulmonary effort is normal.     Breath sounds: Normal breath sounds. No wheezing or rales.  Musculoskeletal:     Right lower leg: No edema.     Left lower leg: No edema.  Skin:    Comments: Marked red/purplish discoloration of calves and feet--right > left. No ulcerations. No infective redness. Plantar callouses don't appear open at all Widespread ecchymoses  Neurological:     Mental Status: She is alert.            Assessment & Plan:

## 2020-12-07 NOTE — Assessment & Plan Note (Signed)
GFR stable in the 20's despite diuresis and illness

## 2020-12-07 NOTE — Assessment & Plan Note (Signed)
Has improved Chronic skin changes not consistent with infection now Unclear how the infection started No sig edema

## 2020-12-07 NOTE — Assessment & Plan Note (Signed)
Seems to be worse---especially in right leg ABI abnormal Will ask Dr Fletcher Anon to get her in soon to evaluate whether further evaluation indicated

## 2020-12-07 NOTE — Assessment & Plan Note (Signed)
Lab Results  Component Value Date   HGBA1C 7.4 (H) 11/25/2020   Sugars higher in past week but recent A1c still fine Continue the current insulin Gabapentin for neuropathy

## 2020-12-08 ENCOUNTER — Telehealth: Payer: Self-pay | Admitting: Pulmonary Disease

## 2020-12-08 ENCOUNTER — Telehealth: Payer: Self-pay

## 2020-12-08 MED ORDER — GABAPENTIN 300 MG PO CAPS: ORAL_CAPSULE | ORAL | 3 refills | Status: AC

## 2020-12-08 MED ORDER — LOVASTATIN 40 MG PO TABS: 80.0000 mg | ORAL_TABLET | Freq: Every day | ORAL | 3 refills | Status: AC

## 2020-12-08 NOTE — Telephone Encounter (Signed)
Call returned to assist, spoke with Almyra Free, confirmed patient DOB, made aware we will need the application faxed to [place] so we can fill in the needed information. Voiced she will re-fax the application.   Will route message to provider nurse to f/u once application has been received.   Will place application in OA box once received.

## 2020-12-08 NOTE — Telephone Encounter (Signed)
Pt needs refills sent to pharmacy. Rx sent electronically.

## 2020-12-12 NOTE — Telephone Encounter (Signed)
Please refer to encounter from 12/08/20 as this is a duplicate encounter to that one.

## 2020-12-12 NOTE — Telephone Encounter (Signed)
Checked AO's papers and did not see a fax that had been re-received on pt.  Called Assist and spoke with Almyra Free letting her know that I did not see where the fax had been received after it had been refaxed. Almyra Free stated that they will refax it in Dr. Colman Cater attn. Will await fax.

## 2020-12-12 NOTE — Telephone Encounter (Signed)
Lindsay Harmon/Lindsay Harmon, have you received this please. Thanks.

## 2020-12-12 NOTE — Telephone Encounter (Signed)
Heather calling back.  Stated that Rx was not filled out & the re-faxed the form to [place] on 2/11.  Would like to know if we received this & the status.  Can be reached at [phone].  Anyone can help with questions, please ask to speak with [facility].  Pt's UTID# is [phone]

## 2020-12-13 NOTE — Telephone Encounter (Signed)
Checked AO box, nothing upfront. Call made to Lindsay Harmon, they are going to re-fax paper work.

## 2020-12-13 NOTE — Telephone Encounter (Signed)
pt calling to follow up medication form, pt spoke with insurance & they said if the provider call or sent over letter stating that pt is on the medication, is out of it &  that its needed, & they will see if they can make an exception to help pt.  please advise [phone]

## 2020-12-14 ENCOUNTER — Other Ambulatory Visit: Payer: Self-pay

## 2020-12-14 MED ORDER — PROAIR HFA 108 (90 BASE) MCG/ACT IN AERS: INHALATION_SPRAY | RESPIRATORY_TRACT | 3 refills | Status: AC

## 2020-12-14 MED ORDER — STIOLTO RESPIMAT 2.5-2.5 MCG/ACT IN AERS: INHALATION_SPRAY | RESPIRATORY_TRACT | 3 refills | Status: AC

## 2020-12-14 NOTE — Telephone Encounter (Signed)
I have located paperwork and called Dr. Ander Slade to get part H filled out. It has been filled out with his requests and it has been faxed to [phone]. Called [employer]ist to let them know that it has been refaxed and also called patient to let her know that it has been refaxed. Confirmation received. Nothing further needed at this time.

## 2020-12-14 NOTE — Patient Instructions (Addendum)
Dear Lindsay Harmon,  Below is a summary of the goals we discussed during our follow up appointment on December 06, 2020. Please contact me anytime with questions or concerns.   Visit Information Patient Care Plan: CCM Pharmacy Care Plan    Problem Identified: Diabetes and Heart Failure   Priority: High  Onset Date: 12/06/2020  Note:    Current Barriers:  . None identified  Pharmacist Clinical Goal(s):  Marland Kitchen Over the next 30 days, patient will maintain control of diabates and heart failure as evidenced by A1c < 8% and prevent fluid overload by maintaining stable weight and taking torsemide as prescribed  through collaboration with PharmD and provider.   Interventions: . 1:1 collaboration with Venia Carbon, MD regarding development and update of comprehensive plan of care as evidenced by provider attestation and co-signature . Inter-disciplinary care team collaboration (see longitudinal plan of care) . Comprehensive medication review performed; medication list updated in electronic medical record  Diabetes (A1c goal <8%) -controlled - Due to renal impairment and frequent lows, targeting higher A1c goal of < 8%. -Current medications: . Lantus - Inject 13 units daily -Medications previously tried: metformin - CKD -Current home glucose readings - checking BG every morning  Fasting: 215 (missed insulin yesterday), 180, 190, 165, 167, 139, 135, 142, 98, 172, 123, 138, 158, 100, 141, 124, 113, 113, 104, 85, 104, 127, 79   (Reports BG has been a little higher the past 3-4 days, no diet changes, missed insulin yesterday (this is rare). Has been home from hospital since Tuesday (2/1) last week, finished antibiotics no steroids given.) -Denies hypoglycemic/hyperglycemic symptoms -Current meal patterns:   Denies any change in diet; eats breakfast daily, snack at lunch (PB crackers, fruit) and supper; eats at home mostly - loves fresh vegetables, pork or chicken, baked potato, green peas; denies  bedtime snacking; drinks - water, tea with artificial sweetener -Educated onA1c and blood sugar goals; Prevention and management of hypoglycemic episodes; -Counseled to check feet daily and get yearly eye exams -Recommended to continue current medication; Instructed to call if fasting BG remains elevated above 150.  Heart Failure (Goal: control symptoms and prevent exacerbations) controlled Type: Diastolic -NYHA Class: II (slight limitation of activity) -Ejection fraction:60-65% (Date: 01/04/20) -Current treatment:  Metoprolol tartrate 25 mg - 1/2 tablet BID   Torsemide 20 mg - 3 tablets daily with 3 additional tablets if weight over 150  Metolazone 2.5 mg - 1 tablet every Friday  Potassium chloride SA 20 mEq - 2 tablets with metolazone every Friday -Medications previously tried: none reported -Current home BP/HR readings: none reported -Current weight is down from 152.6 on 11/24/20 to current range 135-139 lbs. Denies any swelling. She weighs herself daily but only takes extra torsemide 60 mg if weight over 150 per Dr Alla German recommendation. Reports she used her rescue inhaler yesterday and this helped. Denies recent use of nebulizer. -Educated on: Call heart clinic if she gains > 3 lbs overnight or 5 lbs in 7 days  -Recommended to continue current medication   Patient Goals/Self-Care Activities . Over the next 30 days, patient will:  - take medications as prescribed - weigh daily, and contact provider if weight gain of 3 pounds in 24 hours or 5 pounds in 1 week  - call if fasting BG remains above 150 daily      The patient verbalized understanding of instructions, educational materials, and care plan provided today and declined offer to receive copy of patient instructions, educational materials, and  care plan.   Telephone follow up appointment with pharmacy team member scheduled for: June 07, 2021 at 2  PM   Debbora Dus, PharmD Clinical Pharmacist [facility]  at [facility] [phone]   Diabetes Mellitus and Nutrition, Adult When you have diabetes, or diabetes mellitus, it is very important to have healthy eating habits because your blood sugar (glucose) levels are greatly affected by what you eat and drink. Eating healthy foods in the right amounts, at about the same times every day, can help you:  Control your blood glucose.  Lower your risk of heart disease.  Improve your blood pressure.  Reach or maintain a healthy weight. What can affect my meal plan? Every person with diabetes is different, and each person has different needs for a meal plan. Your health care provider may recommend that you work with a dietitian to make a meal plan that is best for you. Your meal plan may vary depending on factors such as:  The calories you need.  The medicines you take.  Your weight.  Your blood glucose, blood pressure, and cholesterol levels.  Your activity level.  Other health conditions you have, such as heart or kidney disease. How do carbohydrates affect me? Carbohydrates, also called carbs, affect your blood glucose level more than any other type of food. Eating carbs naturally raises the amount of glucose in your blood. Carb counting is a method for keeping track of how many carbs you eat. Counting carbs is important to keep your blood glucose at a healthy level, especially if you use insulin or take certain oral diabetes medicines. It is important to know how many carbs you can safely have in each meal. This is different for every person. Your dietitian can help you calculate how many carbs you should have at each meal and for each snack. How does alcohol affect me? Alcohol can cause a sudden decrease in blood glucose (hypoglycemia), especially if you use insulin or take certain oral diabetes medicines. Hypoglycemia can be a life-threatening condition. Symptoms of hypoglycemia, such as sleepiness, dizziness, and confusion, are similar to  symptoms of having too much alcohol.  Do not drink alcohol if: ? Your health care provider tells you not to drink. ? You are pregnant, may be pregnant, or are planning to become pregnant.  If you drink alcohol: ? Do not drink on an empty stomach. ? Limit how much you use to:  0-1 drink a day for women.  0-2 drinks a day for men. ? Be aware of how much alcohol is in your drink. In the [place] one drink equals one 12 oz bottle of beer (355 mL), one 5 oz glass of wine (148 mL), or one 1 oz glass of hard liquor (44 mL). ? Keep yourself hydrated with water, diet soda, or unsweetened iced tea.  Keep in mind that regular soda, juice, and other mixers may contain a lot of sugar and must be counted as carbs. What are tips for following this plan? Reading food labels  Start by checking the serving size on the "Nutrition Facts" label of packaged foods and drinks. The amount of calories, carbs, fats, and other nutrients listed on the label is based on one serving of the item. Many items contain more than one serving per package.  Check the total grams (g) of carbs in one serving. You can calculate the number of servings of carbs in one serving by dividing the total carbs by 15. For example, if a  food has 30 g of total carbs per serving, it would be equal to 2 servings of carbs.  Check the number of grams (g) of saturated fats and trans fats in one serving. Choose foods that have a low amount or none of these fats.  Check the number of milligrams (mg) of salt (sodium) in one serving. Most people should limit total sodium intake to less than 2,300 mg per day.  Always check the nutrition information of foods labeled as "low-fat" or "nonfat." These foods may be higher in added sugar or refined carbs and should be avoided.  Talk to your dietitian to identify your daily goals for nutrients listed on the label. Shopping  Avoid buying canned, pre-made, or processed foods. These foods tend to be high in  fat, sodium, and added sugar.  Shop around the outside edge of the grocery store. This is where you will most often find fresh fruits and vegetables, bulk grains, fresh meats, and fresh dairy. Cooking  Use low-heat cooking methods, such as baking, instead of high-heat cooking methods like deep frying.  Cook using healthy oils, such as olive, canola, or sunflower oil.  Avoid cooking with butter, cream, or high-fat meats. Meal planning  Eat meals and snacks regularly, preferably at the same times every day. Avoid going long periods of time without eating.  Eat foods that are high in fiber, such as fresh fruits, vegetables, beans, and whole grains. Talk with your dietitian about how many servings of carbs you can eat at each meal.  Eat 4-6 oz (112-168 g) of lean protein each day, such as lean meat, chicken, fish, eggs, or tofu. One ounce (oz) of lean protein is equal to: ? 1 oz (28 g) of meat, chicken, or fish. ? 1 egg. ?  cup (62 g) of tofu.  Eat some foods each day that contain healthy fats, such as avocado, nuts, seeds, and fish.   What foods should I eat? Fruits Berries. Apples. Oranges. Peaches. Apricots. Plums. Grapes. Mango. Papaya. Pomegranate. Kiwi. Cherries. Vegetables Lettuce. Spinach. Leafy greens, including kale, chard, collard greens, and mustard greens. Beets. Cauliflower. Cabbage. Broccoli. Carrots. Green beans. Tomatoes. Peppers. Onions. Cucumbers. Brussels sprouts. Grains Whole grains, such as whole-wheat or whole-grain bread, crackers, tortillas, cereal, and pasta. Unsweetened oatmeal. Quinoa. Brown or wild rice. Meats and other proteins Seafood. Poultry without skin. Lean cuts of poultry and beef. Tofu. Nuts. Seeds. Dairy Low-fat or fat-free dairy products such as milk, yogurt, and cheese. The items listed above may not be a complete list of foods and beverages you can eat. Contact a dietitian for more information. What foods should I avoid? Fruits Fruits canned  with syrup. Vegetables Canned vegetables. Frozen vegetables with butter or cream sauce. Grains Refined white flour and flour products such as bread, pasta, snack foods, and cereals. Avoid all processed foods. Meats and other proteins Fatty cuts of meat. Poultry with skin. Breaded or fried meats. Processed meat. Avoid saturated fats. Dairy Full-fat yogurt, cheese, or milk. Beverages Sweetened drinks, such as soda or iced tea. The items listed above may not be a complete list of foods and beverages you should avoid. Contact a dietitian for more information. Questions to ask a health care provider  Do I need to meet with a diabetes educator?  Do I need to meet with a dietitian?  What number can I call if I have questions?  When are the best times to check my blood glucose? Where to find more information:  American Diabetes  Association: diabetes.org  Academy of Nutrition and Dietetics: www.eatright.[employer] of Diabetes and Digestive and Kidney Diseases: [employer]  Association of Diabetes Care and Education Specialists: www.diabeteseducator.org Summary  It is important to have healthy eating habits because your blood sugar (glucose) levels are greatly affected by what you eat and drink.  A healthy meal plan will help you control your blood glucose and maintain a healthy lifestyle.  Your health care provider may recommend that you work with a dietitian to make a meal plan that is best for you.  Keep in mind that carbohydrates (carbs) and alcohol have immediate effects on your blood glucose levels. It is important to count carbs and to use alcohol carefully. This information is not intended to replace advice given to you by your health care provider. Make sure you discuss any questions you have with your health care provider. Document Revised: 09/21/2019 Document Reviewed: 09/21/2019 Elsevier Patient Education  2021 [employer].

## 2020-12-18 ENCOUNTER — Telehealth: Payer: Self-pay | Admitting: Pulmonary Disease

## 2020-12-18 ENCOUNTER — Encounter: Payer: Self-pay | Admitting: Podiatry

## 2020-12-18 ENCOUNTER — Ambulatory Visit: Payer: PPO | Admitting: Podiatry

## 2020-12-18 ENCOUNTER — Emergency Department: Payer: PPO

## 2020-12-18 ENCOUNTER — Inpatient Hospital Stay
Admission: EM | Admit: 2020-12-18 | Discharge: 2020-12-26 | DRG: 252 | Disposition: E | Payer: PPO | Source: Ambulatory Visit | Attending: Internal Medicine | Admitting: Internal Medicine

## 2020-12-18 ENCOUNTER — Other Ambulatory Visit: Payer: Self-pay

## 2020-12-18 DIAGNOSIS — L03115 Cellulitis of right lower limb: Principal | ICD-10-CM

## 2020-12-18 DIAGNOSIS — Z808 Family history of malignant neoplasm of other organs or systems: Secondary | ICD-10-CM

## 2020-12-18 DIAGNOSIS — J9621 Acute and chronic respiratory failure with hypoxia: Secondary | ICD-10-CM | POA: Diagnosis not present

## 2020-12-18 DIAGNOSIS — I2510 Atherosclerotic heart disease of native coronary artery without angina pectoris: Secondary | ICD-10-CM | POA: Diagnosis present

## 2020-12-18 DIAGNOSIS — G894 Chronic pain syndrome: Secondary | ICD-10-CM | POA: Diagnosis present

## 2020-12-18 DIAGNOSIS — E1122 Type 2 diabetes mellitus with diabetic chronic kidney disease: Secondary | ICD-10-CM | POA: Diagnosis not present

## 2020-12-18 DIAGNOSIS — G8929 Other chronic pain: Secondary | ICD-10-CM | POA: Diagnosis present

## 2020-12-18 DIAGNOSIS — G9341 Metabolic encephalopathy: Secondary | ICD-10-CM | POA: Diagnosis not present

## 2020-12-18 DIAGNOSIS — M549 Dorsalgia, unspecified: Secondary | ICD-10-CM | POA: Diagnosis present

## 2020-12-18 DIAGNOSIS — E876 Hypokalemia: Secondary | ICD-10-CM | POA: Diagnosis not present

## 2020-12-18 DIAGNOSIS — E1149 Type 2 diabetes mellitus with other diabetic neurological complication: Secondary | ICD-10-CM | POA: Diagnosis not present

## 2020-12-18 DIAGNOSIS — E1165 Type 2 diabetes mellitus with hyperglycemia: Secondary | ICD-10-CM | POA: Diagnosis not present

## 2020-12-18 DIAGNOSIS — E871 Hypo-osmolality and hyponatremia: Secondary | ICD-10-CM | POA: Diagnosis not present

## 2020-12-18 DIAGNOSIS — I739 Peripheral vascular disease, unspecified: Secondary | ICD-10-CM | POA: Diagnosis present

## 2020-12-18 DIAGNOSIS — M545 Low back pain, unspecified: Secondary | ICD-10-CM | POA: Diagnosis not present

## 2020-12-18 DIAGNOSIS — J439 Emphysema, unspecified: Secondary | ICD-10-CM | POA: Diagnosis not present

## 2020-12-18 DIAGNOSIS — E1142 Type 2 diabetes mellitus with diabetic polyneuropathy: Secondary | ICD-10-CM | POA: Diagnosis not present

## 2020-12-18 DIAGNOSIS — L97329 Non-pressure chronic ulcer of left ankle with unspecified severity: Secondary | ICD-10-CM | POA: Diagnosis not present

## 2020-12-18 DIAGNOSIS — L02619 Cutaneous abscess of unspecified foot: Secondary | ICD-10-CM | POA: Diagnosis not present

## 2020-12-18 DIAGNOSIS — I5082 Biventricular heart failure: Secondary | ICD-10-CM | POA: Diagnosis not present

## 2020-12-18 DIAGNOSIS — I70248 Atherosclerosis of native arteries of left leg with ulceration of other part of lower left leg: Secondary | ICD-10-CM | POA: Diagnosis not present

## 2020-12-18 DIAGNOSIS — I469 Cardiac arrest, cause unspecified: Secondary | ICD-10-CM | POA: Diagnosis not present

## 2020-12-18 DIAGNOSIS — I10 Essential (primary) hypertension: Secondary | ICD-10-CM | POA: Diagnosis not present

## 2020-12-18 DIAGNOSIS — E669 Obesity, unspecified: Secondary | ICD-10-CM | POA: Diagnosis present

## 2020-12-18 DIAGNOSIS — I959 Hypotension, unspecified: Secondary | ICD-10-CM | POA: Diagnosis not present

## 2020-12-18 DIAGNOSIS — M19072 Primary osteoarthritis, left ankle and foot: Secondary | ICD-10-CM | POA: Diagnosis not present

## 2020-12-18 DIAGNOSIS — Z825 Family history of asthma and other chronic lower respiratory diseases: Secondary | ICD-10-CM

## 2020-12-18 DIAGNOSIS — L03119 Cellulitis of unspecified part of limb: Secondary | ICD-10-CM

## 2020-12-18 DIAGNOSIS — I130 Hypertensive heart and chronic kidney disease with heart failure and stage 1 through stage 4 chronic kidney disease, or unspecified chronic kidney disease: Secondary | ICD-10-CM | POA: Diagnosis not present

## 2020-12-18 DIAGNOSIS — Z6830 Body mass index (BMI) 30.0-30.9, adult: Secondary | ICD-10-CM

## 2020-12-18 DIAGNOSIS — Z87891 Personal history of nicotine dependence: Secondary | ICD-10-CM

## 2020-12-18 DIAGNOSIS — Z4659 Encounter for fitting and adjustment of other gastrointestinal appliance and device: Secondary | ICD-10-CM

## 2020-12-18 DIAGNOSIS — M47816 Spondylosis without myelopathy or radiculopathy, lumbar region: Secondary | ICD-10-CM | POA: Diagnosis not present

## 2020-12-18 DIAGNOSIS — J9611 Chronic respiratory failure with hypoxia: Secondary | ICD-10-CM | POA: Diagnosis present

## 2020-12-18 DIAGNOSIS — E1151 Type 2 diabetes mellitus with diabetic peripheral angiopathy without gangrene: Secondary | ICD-10-CM | POA: Diagnosis not present

## 2020-12-18 DIAGNOSIS — N184 Chronic kidney disease, stage 4 (severe): Secondary | ICD-10-CM | POA: Diagnosis not present

## 2020-12-18 DIAGNOSIS — L03116 Cellulitis of left lower limb: Secondary | ICD-10-CM | POA: Diagnosis present

## 2020-12-18 DIAGNOSIS — R578 Other shock: Secondary | ICD-10-CM | POA: Diagnosis not present

## 2020-12-18 DIAGNOSIS — Z20822 Contact with and (suspected) exposure to covid-19: Secondary | ICD-10-CM | POA: Diagnosis present

## 2020-12-18 DIAGNOSIS — F411 Generalized anxiety disorder: Secondary | ICD-10-CM | POA: Diagnosis present

## 2020-12-18 DIAGNOSIS — L0390 Cellulitis, unspecified: Secondary | ICD-10-CM | POA: Diagnosis present

## 2020-12-18 DIAGNOSIS — Z833 Family history of diabetes mellitus: Secondary | ICD-10-CM

## 2020-12-18 DIAGNOSIS — I2781 Cor pulmonale (chronic): Secondary | ICD-10-CM | POA: Diagnosis not present

## 2020-12-18 DIAGNOSIS — E785 Hyperlipidemia, unspecified: Secondary | ICD-10-CM | POA: Diagnosis present

## 2020-12-18 DIAGNOSIS — Z9981 Dependence on supplemental oxygen: Secondary | ICD-10-CM

## 2020-12-18 DIAGNOSIS — I5032 Chronic diastolic (congestive) heart failure: Secondary | ICD-10-CM | POA: Diagnosis not present

## 2020-12-18 DIAGNOSIS — J449 Chronic obstructive pulmonary disease, unspecified: Secondary | ICD-10-CM | POA: Diagnosis present

## 2020-12-18 DIAGNOSIS — L97321 Non-pressure chronic ulcer of left ankle limited to breakdown of skin: Secondary | ICD-10-CM | POA: Diagnosis not present

## 2020-12-18 DIAGNOSIS — I272 Pulmonary hypertension, unspecified: Secondary | ICD-10-CM | POA: Diagnosis present

## 2020-12-18 DIAGNOSIS — Z7982 Long term (current) use of aspirin: Secondary | ICD-10-CM

## 2020-12-18 DIAGNOSIS — R001 Bradycardia, unspecified: Secondary | ICD-10-CM | POA: Diagnosis not present

## 2020-12-18 DIAGNOSIS — L84 Corns and callosities: Secondary | ICD-10-CM

## 2020-12-18 DIAGNOSIS — E11649 Type 2 diabetes mellitus with hypoglycemia without coma: Secondary | ICD-10-CM | POA: Diagnosis not present

## 2020-12-18 DIAGNOSIS — F32A Depression, unspecified: Secondary | ICD-10-CM | POA: Diagnosis present

## 2020-12-18 DIAGNOSIS — Z888 Allergy status to other drugs, medicaments and biological substances status: Secondary | ICD-10-CM

## 2020-12-18 DIAGNOSIS — I2729 Other secondary pulmonary hypertension: Secondary | ICD-10-CM | POA: Diagnosis not present

## 2020-12-18 DIAGNOSIS — B9562 Methicillin resistant Staphylococcus aureus infection as the cause of diseases classified elsewhere: Secondary | ICD-10-CM | POA: Diagnosis present

## 2020-12-18 DIAGNOSIS — Z01818 Encounter for other preprocedural examination: Secondary | ICD-10-CM

## 2020-12-18 DIAGNOSIS — Z794 Long term (current) use of insulin: Secondary | ICD-10-CM

## 2020-12-18 DIAGNOSIS — Z4682 Encounter for fitting and adjustment of non-vascular catheter: Secondary | ICD-10-CM | POA: Diagnosis not present

## 2020-12-18 DIAGNOSIS — J431 Panlobular emphysema: Secondary | ICD-10-CM | POA: Diagnosis not present

## 2020-12-18 DIAGNOSIS — E1152 Type 2 diabetes mellitus with diabetic peripheral angiopathy with gangrene: Principal | ICD-10-CM | POA: Diagnosis present

## 2020-12-18 DIAGNOSIS — R55 Syncope and collapse: Secondary | ICD-10-CM | POA: Diagnosis not present

## 2020-12-18 DIAGNOSIS — Z79899 Other long term (current) drug therapy: Secondary | ICD-10-CM

## 2020-12-18 DIAGNOSIS — K219 Gastro-esophageal reflux disease without esophagitis: Secondary | ICD-10-CM | POA: Diagnosis present

## 2020-12-18 LAB — COMPREHENSIVE METABOLIC PANEL
ALT: 19 U/L (ref 0–44)
AST: 33 U/L (ref 15–41)
Albumin: 3.3 g/dL — ABNORMAL LOW (ref 3.5–5.0)
Alkaline Phosphatase: 52 U/L (ref 38–126)
Anion gap: 12 (ref 5–15)
BUN: 38 mg/dL — ABNORMAL HIGH (ref 8–23)
CO2: 35 mmol/L — ABNORMAL HIGH (ref 22–32)
Calcium: 9.5 mg/dL (ref 8.9–10.3)
Chloride: 90 mmol/L — ABNORMAL LOW (ref 98–111)
Creatinine, Ser: 2.02 mg/dL — ABNORMAL HIGH (ref 0.44–1.00)
GFR, Estimated: 27 mL/min — ABNORMAL LOW (ref >60)
Glucose, Bld: 53 mg/dL — ABNORMAL LOW (ref 70–99)
Potassium: 3.5 mmol/L (ref 3.5–5.1)
Sodium: 137 mmol/L (ref 135–145)
Total Bilirubin: 1.7 mg/dL — ABNORMAL HIGH (ref 0.3–1.2)
Total Protein: 6.7 g/dL (ref 6.5–8.1)

## 2020-12-18 LAB — LACTIC ACID, PLASMA
Lactic Acid, Venous: 1.7 mmol/L (ref 0.5–1.9)
Lactic Acid, Venous: 1.9 mmol/L (ref 0.5–1.9)

## 2020-12-18 LAB — CBC WITH DIFFERENTIAL/PLATELET
Abs Immature Granulocytes: 0.04 10*3/uL (ref 0.00–0.07)
Basophils Absolute: 0.1 10*3/uL (ref 0.0–0.1)
Basophils Relative: 1 %
Eosinophils Absolute: 0.1 10*3/uL (ref 0.0–0.5)
Eosinophils Relative: 1 %
HCT: 44 % (ref 36.0–46.0)
Hemoglobin: 14.2 g/dL (ref 12.0–15.0)
Immature Granulocytes: 0 %
Lymphocytes Relative: 11 %
Lymphs Abs: 1 10*3/uL (ref 0.7–4.0)
MCH: 30 pg (ref 26.0–34.0)
MCHC: 32.3 g/dL (ref 30.0–36.0)
MCV: 93 fL (ref 80.0–100.0)
Monocytes Absolute: 0.7 10*3/uL (ref 0.1–1.0)
Monocytes Relative: 8 %
Neutro Abs: 7.3 10*3/uL (ref 1.7–7.7)
Neutrophils Relative %: 79 %
Platelets: 249 10*3/uL (ref 150–400)
RBC: 4.73 MIL/uL (ref 3.87–5.11)
RDW: 15.5 % (ref 11.5–15.5)
WBC: 9.2 10*3/uL (ref 4.0–10.5)
nRBC: 0 % (ref 0.0–0.2)

## 2020-12-18 LAB — PROTIME-INR
INR: 1.7 — ABNORMAL HIGH (ref 0.8–1.2)
Prothrombin Time: 19 seconds — ABNORMAL HIGH (ref 11.4–15.2)

## 2020-12-18 MED ORDER — HYDROCODONE-ACETAMINOPHEN 5-325 MG PO TABS
1.0000 | ORAL_TABLET | ORAL | Status: DC | PRN
  Administered 2020-12-19 (×3): 2 via ORAL
  Filled 2020-12-18 (×3): qty 2

## 2020-12-18 MED ORDER — ONDANSETRON HCL 4 MG/2ML IJ SOLN
4.0000 mg | Freq: Four times a day (QID) | INTRAMUSCULAR | Status: DC | PRN
  Filled 2020-12-18 (×2): qty 2

## 2020-12-18 MED ORDER — VANCOMYCIN HCL IN DEXTROSE 1-5 GM/200ML-% IV SOLN
1000.0000 mg | Freq: Once | INTRAVENOUS | Status: AC
  Administered 2020-12-18: 1000 mg via INTRAVENOUS
  Filled 2020-12-18: qty 200

## 2020-12-18 MED ORDER — ACETAMINOPHEN 650 MG RE SUPP: 650.0000 mg | Freq: Four times a day (QID) | RECTAL | Status: DC | PRN

## 2020-12-18 MED ORDER — INSULIN ASPART 100 UNIT/ML ~~LOC~~ SOLN
0.0000 [IU] | Freq: Three times a day (TID) | SUBCUTANEOUS | Status: DC
  Administered 2020-12-19: 3 [IU] via SUBCUTANEOUS
  Filled 2020-12-18: qty 1

## 2020-12-18 MED ORDER — ACETAMINOPHEN 325 MG PO TABS: 650.0000 mg | ORAL_TABLET | Freq: Four times a day (QID) | ORAL | Status: DC | PRN

## 2020-12-18 MED ORDER — VANCOMYCIN HCL IN DEXTROSE 1-5 GM/200ML-% IV SOLN
1000.0000 mg | INTRAVENOUS | Status: DC
  Administered 2020-12-20: 1000 mg via INTRAVENOUS
  Filled 2020-12-18 (×2): qty 200

## 2020-12-18 MED ORDER — MORPHINE SULFATE (PF) 2 MG/ML IV SOLN
2.0000 mg | INTRAVENOUS | Status: DC | PRN
  Administered 2020-12-20 – 2020-12-22 (×9): 2 mg via INTRAVENOUS
  Filled 2020-12-18 (×8): qty 1

## 2020-12-18 MED ORDER — VANCOMYCIN HCL 500 MG/100ML IV SOLN
500.0000 mg | Freq: Once | INTRAVENOUS | Status: AC
  Administered 2020-12-19: 500 mg via INTRAVENOUS
  Filled 2020-12-18: qty 100

## 2020-12-18 MED ORDER — PIPERACILLIN-TAZOBACTAM 3.375 G IVPB 30 MIN
3.3750 g | Freq: Once | INTRAVENOUS | Status: AC
  Administered 2020-12-18: 3.375 g via INTRAVENOUS
  Filled 2020-12-18: qty 50

## 2020-12-18 MED ORDER — INSULIN ASPART 100 UNIT/ML ~~LOC~~ SOLN
0.0000 [IU] | Freq: Every day | SUBCUTANEOUS | Status: DC
  Filled 2020-12-18: qty 1

## 2020-12-18 MED ORDER — IPRATROPIUM-ALBUTEROL 0.5-2.5 (3) MG/3ML IN SOLN: 3.0000 mL | Freq: Four times a day (QID) | RESPIRATORY_TRACT | Status: DC | PRN

## 2020-12-18 MED ORDER — ENOXAPARIN SODIUM 30 MG/0.3ML ~~LOC~~ SOLN
30.0000 mg | SUBCUTANEOUS | Status: DC
  Administered 2020-12-19 – 2020-12-21 (×4): 30 mg via SUBCUTANEOUS
  Filled 2020-12-18 (×4): qty 0.3

## 2020-12-18 MED ORDER — ONDANSETRON HCL 4 MG PO TABS: 4.0000 mg | ORAL_TABLET | Freq: Four times a day (QID) | ORAL | Status: DC | PRN

## 2020-12-18 NOTE — ED Triage Notes (Signed)
Pt comes with c/o left leg and foot infection. Pt states she went to her Podiatrist and was sent here for admit to hospital.  Pt states she was recently in hospital for right leg and then discharged. Pt states then she noticed her left leg started to get swollen and red. Pt has redness, warmth and swelling to left leg and foot.

## 2020-12-18 NOTE — Progress Notes (Addendum)
This patient returns to my office for at risk foot care.  This patient requires this care by a professional since this patient will be at risk due to having chronic kidney disease and diabetes with vascular complications and neuropathy.   Two weeks after her last appointment with me  she developed a cellulitis right leg and foot.  She was evaluated by Dr.  Posey Pronto and told to go to the ER.  She was admitted to the hospital and treated with IV antibiotics.  She improved and her right foot was better taking augmentin.  She presents to the office today for pre-ulcerous callus care.  She has developed a hot swollen infected leg and foot left foot.  She says this has been present for one day.  She says there is significant pain left foot/leg.  She says she has finished her oral antibiotics about 4 days ago.  General Appearance  Alert, conversant and in no acute stress.  Vascular  Dorsalis pedis and posterior tibial  pulses are not  palpable  bilaterally.  Capillary return is within normal limits  bilaterally. Cold feet  bilaterally.  Neurologic  Senn-Weinstein monofilament wire test diminished/absent  bilaterally. Muscle power within normal limits bilaterally.  Nails Thick disfigured discolored nails with subungual debris  from hallux to fifth toes bilaterally. No evidence of bacterial infection or drainage bilaterally.  Orthopedic  No limitations of motion  feet .  No crepitus or effusions noted.  No bony pathology or digital deformities noted. Plantar flexed fifth metatarsal right foot greater than left foot.  Skin     Healing hemorrhagic callus noted sub 5th right. Callus sub 5th left foot.  There is a small ulcer noted over the lateral malleolus left foot.  There is red swollen dorsum of right  foot and leg. There is an acute cellulitis noted.   Red discoloration with minimal increased temperature right foot.  Pre- callus right foot.  Porokeratosis sub 5th left foot.  Cellulitis left foot/leg   ROV to  discuss preulcerous callus right foot.  Debridement of callus under the fifth MPJ of the right foot using a #15 blade.  Consulted with Dr.  Sherryle Lis and he recommended she return to the ER for  further antibiotic treatment at the hospital.  Consider vascular consult.   RTC  Prn..                 Told patient to return for periodic foot care and evaluation due to potential at risk complications.   Gardiner Barefoot DPM

## 2020-12-18 NOTE — Consult Note (Signed)
Pharmacy Antibiotic Note  Lindsay Harmon is a 65 y.o. female with medical history including diabetes c/b neuropathy, PVD, CKD admitted on 11/28/2020 with cellulitis.  Pharmacy has been consulted for vancomycin dosing.  Plan:  Vancomycin 1500 mg IV LD x 1 followed by maintenance regimen of 1000 mg IV q48h --Predicted AUC: 482, Cmin 11.8 --Levels at steady state as indicated --Daily Scr per protocol while on vancomycin  Height: _0  (154.9 cm) Weight: 67.5 kg (148 lb 12.8 oz) IBW/kg (Calculated) : 47.8  Temp (24hrs), Avg:98.7 F (37.1 C), Min:98.6 F (37 C), Max:98.8 F (37.1 C)  Recent Labs  Lab 12/12/2020 1604 12/06/2020 2051  WBC 9.2  --   CREATININE 2.02*  --   LATICACIDVEN 1.7 1.9    Estimated Creatinine Clearance: 23.8 mL/min (A) (by C-G formula based on SCr of 2.02 mg/dL (H)).    Allergies  Allergen Reactions  . Sertraline Hcl Other (See Comments)    Altered Mental Status    Antimicrobials this admission: Zosyn 2/21 x 1 Vancomycin 2/21 >>   Dose adjustments this admission: N/A  Microbiology results: 2/21 BCx: pending  Thank you for allowing pharmacy to be a part of this patient's care.  Benita Gutter 12/17/2020 9:44 PM

## 2020-12-18 NOTE — ED Notes (Signed)
Xray at bedside.

## 2020-12-18 NOTE — Telephone Encounter (Signed)
Dr. Jenetta Downer, please advise if you are okay with the info stated by Altha Harm from [facility] about pt's Tyvaso.

## 2020-12-18 NOTE — Consult Note (Signed)
PHARMACY -  BRIEF ANTIBIOTIC NOTE   Pharmacy has received consult(s) for vancomycin from an ED provider. Patient is also ordered Zosyn. The patient's profile has been reviewed for ht/wt/allergies/indication/available labs.    One time order(s) placed for --Vancomycin 1 g IV x 1  Further antibiotics/pharmacy consults should be ordered by admitting physician if indicated.       Thank you, Benita Gutter 11/29/2020  8:22 PM

## 2020-12-18 NOTE — ED Provider Notes (Addendum)
[facility] Emergency Department Provider Note    Event Date/Time   First MD Initiated Contact with Patient 12/04/2020 1949     (approximate)  I have reviewed the triage vital signs and the nursing notes.   HISTORY  Chief Complaint Recurrent Skin Infections and Leg Problem    HPI Lindsay Harmon is a 65 y.o. female below listed past medical history on 4 L chronically due to COPD presents to the ER from podiatry clinic due to worsening cellulitis and ulceration of her left leg.  Has previously been admitted for cellulitis.  States that the redness and pain developed roughly 3 to 4 days ago.  Denies any trauma.  States that she does have history of diabetes.    Past Medical History:  Diagnosis Date  . Allergy   . Aortic atherosclerosis ([place])   . Arthritis   . CKD (chronic kidney disease), stage IV ([place])   . COPD (chronic obstructive pulmonary disease) ([place])    a. 11/2014 PFT's: FEV1 0.87 (38%), FVC 1.41 (48%), FEF 25-75 0.41 (19%). Unable to perform DLCO; b. 12/2017 Simple spirometry ration 67%. FEV1 1.21 L+53% predicted. FVC 1.79L 61% predicted.  . Coronary artery calcification seen on CT scan    a. 03/2017 CT Chest.  . Depression   . Diabetic neuropathy ([place])   . Erythrocytosis 12/13/2019  . Esophageal stricture   . Familial hematuria   . GERD (gastroesophageal reflux disease)   . Hyperlipidemia   . Hypertension   . IBS (irritable bowel syndrome)   . Nephrolithiasis   . NIDDM (non-insulin dependent diabetes mellitus)   . Obesity, unspecified   . Pulmonary hypertension ([place])    a. WHO Group 3; b. 10/2016 Echo: EF 55-60%, no rwma, Gr1 DD, mild to mod AS, mean grad 26mHg, mildly dil RV w/ mildly reduced RV fxn, mildly dil RA. PASP 1141mg; c. 12/2017 RHC: RA 11, RV 92/12, PA 92/33(57), PCWP 15, Fick CO/CI 5.9/3.3. PVR 7.2 WU. Ao 93%, PA 62/64%.  . Marland KitchenVD (peripheral vascular disease) (H[facility])    Family History  Problem Relation Age of Onset  . Diabetes  Mother   . Cancer Mother        ovarian,melanoma  . Allergies Father   . Cancer Father        lung  . Cancer Maternal Grandmother        uterine  . Emphysema Paternal Grandfather   . Allergies Sister   . Breast cancer Neg Hx    Past Surgical History:  Procedure Laterality Date  . ABDOMINAL HYSTERECTOMY    . CARDIAC CATHETERIZATION    . CARPAL TUNNEL RELEASE    . CESAREAN SECTION    . ERCP N/A 03/17/2017   Procedure: ENDOSCOPIC RETROGRADE CHOLANGIOPANCREATOGRAPHY (ERCP);  Surgeon: WoLucilla LameMD;  [facility];  Service: Endoscopy;  Laterality: N/A;  . EUS N/A 03/13/2017   Procedure: FULL UPPER ENDOSCOPIC ULTRASOUND (EUS) RADIAL;  Surgeon: Burbridge, ReMurray HodgkinsMD;  Location: [facility] ENDOSCOPY;  Service: Endoscopy;  Laterality: N/A;  . RIGHT HEART CATH N/A 01/22/2018   Procedure: RIGHT HEART CATH;  Surgeon: BeJolaine ArtistMD;  [facility];  Service: Cardiovascular;  Laterality: N/A;  . RIGHT HEART CATHETERIZATION N/A 12/05/2014   Procedure: RIGHT HEART CATH;  Surgeon: HeSinclair GroomsMD;  [facility];  Service: Cardiovascular;  Laterality: N/A;   Patient Active Problem List   Diagnosis Date Noted  . Cellulitis and abscess of foot, except  toes 12/15/2020  . Lower extremity cellulitis 11/25/2020  . Cellulitis of right leg 11/24/2020  . Bursitis of left elbow 05/05/2020  . Right hip pain 05/05/2020  . Pre-ulcerative calluses 04/10/2020  . Pain due to onychomycosis of toenails of both feet 01/13/2020  . Porokeratosis 01/13/2020  . Erythrocytosis 12/13/2019  . Senile purpura ([place]) 09/27/2019  . Foot pain, bilateral 03/03/2019  . Aortic atherosclerosis ([place])   . Narcotic dependence ([place]) 07/23/2017  . Encounter for chronic pain management 03/21/2017  . Cor pulmonale, chronic ([place])   . Pulmonary hypertension ([place])   . RVF (right ventricular failure) ([place]) 01/17/2015  . Chronic respiratory failure with hypoxia ([place]) 12/15/2014  . COPD  (chronic obstructive pulmonary disease) ([place]) 12/15/2014  . Chronic diastolic heart failure ([place]) 12/05/2014  . Chronic renal disease, stage IV ([place]) 12/01/2014  . Advance directive discussed with patient 11/08/2014  . Diabetes, polyneuropathy ([place]) 09/15/2013  . Routine general medical examination at a health care facility 08/13/2011  . Atherosclerotic heart disease of native coronary artery with angina pectoris ([place]) 02/27/2007  . Chronic back pain 02/27/2007  . Type 2 diabetes mellitus with neurological manifestations, controlled ([place]) 02/26/2007  . Episodic mood disorder ([place]) 02/26/2007  . Essential hypertension, benign 02/26/2007  . PVD (peripheral vascular disease) ([place]) 02/26/2007  . GERD 02/26/2007  . DEGENERATIVE JOINT DISEASE 02/26/2007      Prior to Admission medications   Medication Sig Start Date End Date Taking? Authorizing Provider  albuterol (PROVENTIL) (2.5 MG/3ML) 0.083% nebulizer solution USE 1 VIAL (3ML) BY NEBULIZATION EVERY 6HOURS AS NEEDED FOR WHEEZING OR SHORTNESS OF BREATH 11/10/19   Martyn Ehrich, NP  ALPRAZolam Duanne Moron) 0.25 MG tablet Take 1 tablet (0.25 mg total) by mouth daily as needed for anxiety. 07/24/18   Venia Carbon, MD  aspirin EC 81 MG tablet Take 1 tablet (81 mg total) by mouth daily. 03/23/19   Wellington Hampshire, MD  calcitRIOL (ROCALTROL) 0.5 MCG capsule Take 0.5 mcg by mouth daily.  02/03/17   [provider]  cholecalciferol (VITAMIN D) 1000 units tablet Take 1,000 Units by mouth at bedtime.     [provider]  ferrous sulfate 325 (65 FE) MG tablet Take 1 tablet by mouth daily. 08/03/20 08/03/21  [provider]  gabapentin (NEURONTIN) 300 MG capsule TAKE 1 CAPSULE BY MOUTH IN THE MORNING AND 2 CAPSULES BY MOUTH AT BEDTIME 12/08/20   Viviana Simpler I, MD  glucose blood (ONE TOUCH ULTRA TEST) test strip USE 1 STRIP TO TEST BLOOD SUGAR TWICE DAILY DUE TO LOW BLOOD SUGAR. TAKES LANTUS INSULIN Dx Code E11.51 08/10/20    Viviana Simpler I, MD  insulin glargine (LANTUS SOLOSTAR) 100 UNIT/ML Solostar Pen Inject 15 Units into the skin daily. Patient taking differently: Inject 13 Units into the skin daily. 08/02/20   Venia Carbon, MD  Insulin Pen Needle (B-D UF III MINI PEN NEEDLES) 31G X 5 MM MISC USE 1 PEN NEEDLE TO INJECT INSULIN 01/11/20   Venia Carbon, MD  loratadine (CLARITIN) 10 MG tablet Take 10 mg by mouth 2 (two) times daily.    Tisovec, Fransico Him, MD  lovastatin (MEVACOR) 40 MG tablet Take 2 tablets (80 mg total) by mouth at bedtime. 12/08/20   Venia Carbon, MD  methadone (DOLOPHINE) 10 MG tablet TAKE 2 TABLETS BY MOUTH EVERY 6 HOURS ASNEEDED 11/01/20   Viviana Simpler I, MD  metolazone (ZAROXOLYN) 2.5 MG tablet TAKE 1 TABLET BY MOUTH ONCE A WEEK EVERYTUESDAY 12/07/20  Bensimhon, Shaune Pascal, MD  metoprolol tartrate (LOPRESSOR) 25 MG tablet Take 1/2 tablet by mouth twice a day 05/09/20   Venia Carbon, MD  mometasone (ASMANEX, 120 METERED DOSES,) 220 MCG/INH inhaler Inhale 2 puffs into the lungs 2 (two) times daily. 12/06/20   Olalere, Ernesto Rutherford, MD  nitroGLYCERIN (NITROSTAT) 0.4 MG SL tablet Place 1 tablet (0.4 mg total) under the tongue every 5 (five) minutes as needed for chest pain. 06/06/20   Wellington Hampshire, MD  pantoprazole (PROTONIX) 40 MG tablet Take 1 tablet by mouth twice a day 06/22/20   Venia Carbon, MD  potassium chloride SA (KLOR-CON) 20 MEQ tablet Take 2 tablets (40 mEq total) by mouth once a week. Every Tuesday Patient taking differently: Take 40 mEq by mouth once a week. Every Friday 12/05/20   Bensimhon, Shaune Pascal, MD  PROAIR HFA 108 [phone] Base) MCG/ACT inhaler USE 2 PUFFS EVERY 6 HOURS AS NEEDED FOR WHEEZING OR SHORTNESS OF BREATH 12/14/20   Laurin Coder, MD  Respiratory Therapy Supplies (FLUTTER) DEVI Use as directed 12/10/19   Tyler Pita, MD  Tiotropium Bromide-Olodaterol (STIOLTO RESPIMAT) 2.5-2.5 MCG/ACT AERS INHALE 2 PUFFS BY MOUTH INTO THE LUNGS DAILY 12/14/20    Sherrilyn Rist A, MD  torsemide (DEMADEX) 20 MG tablet Take 3 tablets (60 mg total) by mouth 2 (two) times daily. Hold afternoon dose if your weight is under 150# at home 10/10/20   Viviana Simpler I, MD  Treprostinil (TYVASO) 0.6 MG/ML SOLN Inhale 18 mcg into the lungs 4 (four) times daily.    [provider]    Allergies Sertraline hcl    Social History Social History   Tobacco Use  . Smoking status: Former Smoker    Packs/day: 1.50    Years: 33.00    Pack years: 49.50    Types: Cigarettes    Quit date: 07/28/2005    Years since quitting: 15.4  . Smokeless tobacco: Never Used  Vaping Use  . Vaping Use: Never used  Substance Use Topics  . Alcohol use: No    Alcohol/week: 0.0 standard drinks    Comment: heavy in the past  . Drug use: No    Review of Systems Patient denies headaches, rhinorrhea, blurry vision, numbness, shortness of breath, chest pain, edema, cough, abdominal pain, nausea, vomiting, diarrhea, dysuria, fevers, rashes or hallucinations unless otherwise stated above in HPI. ____________________________________________   PHYSICAL EXAM:  VITAL SIGNS: Vitals:   12/19/2020 1602 12/22/2020 1837  BP: (!) 124/113 126/74  Pulse: 72 75  Resp: 17 18  Temp: 98.6 F (37 C) 98.8 F (37.1 C)  SpO2: 95% 96%    Constitutional: Alert and oriented.  Eyes: Conjunctivae are normal.  Head: Atraumatic. Nose: No congestion/rhinnorhea. Mouth/Throat: Mucous membranes are moist.   Neck: No stridor. Painless ROM.  Cardiovascular: Normal rate, regular rhythm. Grossly normal heart sounds.   Respiratory: Normal respiratory effort.  No retractions. Lungs coarse bs throughout Gastrointestinal: Soft and nontender. No distention. No abdominal bruits. No CVA tenderness. Genitourinary:  Musculoskeletal: Erythroderma bilateral lower extremities left greater than right.  Feet are warm unable to palpate PT or DP pulses. Weak biphasic doppler signals present BLE. Does have  ulceration of the left lateral ankle no purulent drainage.  No joint effusions. Neurologic:  Normal speech and language. No gross focal neurologic deficits are appreciated. No facial droop Skin:  Skin is warm, dry and intact. No rash noted. Psychiatric: Mood and affect are normal. Speech and behavior are normal.  ____________________________________________   LABS (all labs ordered are listed, but only abnormal results are displayed)  Results for orders placed or performed during the hospital encounter of 12/11/2020 (from the past 24 hour(s))  Comprehensive metabolic panel     Status: Abnormal   Collection Time: 12/13/2020  4:04 PM  Result Value Ref Range   Sodium 137 135 - 145 mmol/L   Potassium 3.5 3.5 - 5.1 mmol/L   Chloride 90 (L) 98 - 111 mmol/L   CO2 35 (H) 22 - 32 mmol/L   Glucose, Bld 53 (L) 70 - 99 mg/dL   BUN 38 (H) 8 - 23 mg/dL   Creatinine, Ser 2.02 (H) 0.44 - 1.00 mg/dL   Calcium 9.5 8.9 - 10.3 mg/dL   Total Protein 6.7 6.5 - 8.1 g/dL   Albumin 3.3 (L) 3.5 - 5.0 g/dL   AST 33 15 - 41 U/L   ALT 19 0 - 44 U/L   Alkaline Phosphatase 52 38 - 126 U/L   Total Bilirubin 1.7 (H) 0.3 - 1.2 mg/dL   GFR, Estimated 27 (L) >60 mL/min   Anion gap 12 5 - 15  Lactic acid, plasma     Status: None   Collection Time: 12/22/2020  4:04 PM  Result Value Ref Range   Lactic Acid, Venous 1.7 0.5 - 1.9 mmol/L  CBC with Differential     Status: None   Collection Time: 12/16/2020  4:04 PM  Result Value Ref Range   WBC 9.2 4.0 - 10.5 K/uL   RBC 4.73 3.87 - 5.11 MIL/uL   Hemoglobin 14.2 12.0 - 15.0 g/dL   HCT 44.0 36.0 - 46.0 %   MCV 93.0 80.0 - 100.0 fL   MCH 30.0 26.0 - 34.0 pg   MCHC 32.3 30.0 - 36.0 g/dL   RDW 15.5 11.5 - 15.5 %   Platelets 249 150 - 400 K/uL   nRBC 0.0 0.0 - 0.2 %   Neutrophils Relative % 79 %   Neutro Abs 7.3 1.7 - 7.7 K/uL   Lymphocytes Relative 11 %   Lymphs Abs 1.0 0.7 - 4.0 K/uL   Monocytes Relative 8 %   Monocytes Absolute 0.7 0.1 - 1.0 K/uL   Eosinophils  Relative 1 %   Eosinophils Absolute 0.1 0.0 - 0.5 K/uL   Basophils Relative 1 %   Basophils Absolute 0.1 0.0 - 0.1 K/uL   Immature Granulocytes 0 %   Abs Immature Granulocytes 0.04 0.00 - 0.07 K/uL  Protime-INR     Status: Abnormal   Collection Time: 12/05/2020  4:04 PM  Result Value Ref Range   Prothrombin Time 19.0 (H) 11.4 - 15.2 seconds   INR 1.7 (H) 0.8 - 1.2   ____________________________________________ ____________________________________________  RADIOLOGY  I personally reviewed all radiographic images ordered to evaluate for the above acute complaints and reviewed radiology reports and findings.  These findings were personally discussed with the patient.  Please see medical record for radiology report.  ____________________________________________   PROCEDURES  Procedure(s) performed:  Procedures    Critical Care performed: no ____________________________________________   INITIAL IMPRESSION / ASSESSMENT AND PLAN / ED COURSE  Pertinent labs & imaging results that were available during my care of the patient were reviewed by me and considered in my medical decision making (see chart for details).   DDX: Cellulitis, gangrene, osteo-, PAD  Lindsay Harmon is a 65 y.o. who presents to the ED with presentation as described above.  Patient afebrile hemodynamically stable on her chronic  home O2.  No white count but with extensive erythematous changes bilateral lower extremities left greater than right with ulceration of the lateral ankle given her history of diabetes, CKD will give antibiotics.  X-ray without any evidence of clear osteo-.  Podiatry had recommended vascular surgery consultation.  Will discuss with hospitalist for admission.  Have discussed with the patient and available family all diagnostics and treatments performed thus far and all questions were answered to the best of my ability. The patient demonstrates understanding and agreement with plan.      The  patient was evaluated in Emergency Department today for the symptoms described in the history of present illness. He/she was evaluated in the context of the global COVID-19 pandemic, which necessitated consideration that the patient might be at risk for infection with the SARS-CoV-2 virus that causes COVID-19. Institutional protocols and algorithms that pertain to the evaluation of patients at risk for COVID-19 are in a state of rapid change based on information released by regulatory bodies including the CDC and federal and state organizations. These policies and algorithms were followed during the patient's care in the ED.  As part of my medical decision making, I reviewed the following data within the [place] notes reviewed and incorporated, Labs reviewed, notes from prior ED visits and [place] Controlled Substance Database   ____________________________________________   FINAL CLINICAL IMPRESSION(S) / ED DIAGNOSES  Final diagnoses:  Cellulitis of both lower extremities      NEW MEDICATIONS STARTED DURING THIS VISIT:  New Prescriptions   No medications on file     Note:  This document was prepared using Dragon voice recognition software and may include unintentional dictation errors.    Merlyn Lot, MD 12/15/2020 2039    Merlyn Lot, MD 12/17/2020 2045

## 2020-12-18 NOTE — H&P (Addendum)
History and Physical    Lindsay Harmon ZMO:[id number] DOB: February 15, 1956 DOA: 11/29/2020  PCP: Venia Carbon, MD   Patient coming from: home  I have personally briefly reviewed patient's old medical records in [place]  Chief Complaint: sent by podiatrist fore pain swelling left leg  HPI: Lindsay Harmon is a 65 y.o. female with medical history significant for Chronic diastolic heart failure, CAD, CKD 4, COPD on home O2 at 4 L, HTN, DM2 with polyneuropathy and PVD, hospitalized for right lower extremity cellulitis from 1/28-2/1 who was sent in by her podiatrist for IV antibiotics and vascular due to recurrent cellulitis, this time of her left lower extremity.  Patient stated she noticed redness and swelling in the leg starting 3 to 4 days ago.  Cellulitis of the right lower extremity has improved.  She denies fever or chills, cough or shortness of breath beyond her baseline.  Denies chest pain.  Denies nausea vomiting, abdominal pain or diarrhea. ED Course: On arrival, afebrile, BP 124/113, pulse 72, respirations 17 with O2 sat 95% on room air.  WBC 9200 with normal lactic acid of 1.7.  CBC WNL.  CMP significant for creatinine of 2.02 which is at baseline for her CKD 4.  Blood sugar noted to be low at 53. EKG not done in the ER Imaging: Left foot x-ray:Mild degenerative changes at the first MTP joint. No acute osseous Abnormality  Patient started on Zosyn and vancomycin.  Hospitalist consulted for admission.  Review of Systems: As per HPI otherwise all other systems on review of systems negative.    Past Medical History:  Diagnosis Date  . Allergy   . Aortic atherosclerosis ([place])   . Arthritis   . CKD (chronic kidney disease), stage IV ([place])   . COPD (chronic obstructive pulmonary disease) ([place])    a. 11/2014 PFT's: FEV1 0.87 (38%), FVC 1.41 (48%), FEF 25-75 0.41 (19%). Unable to perform DLCO; b. 12/2017 Simple spirometry ration 67%. FEV1 1.21 L+53% predicted. FVC 1.79L 61%  predicted.  . Coronary artery calcification seen on CT scan    a. 03/2017 CT Chest.  . Depression   . Diabetic neuropathy ([place])   . Erythrocytosis 12/13/2019  . Esophageal stricture   . Familial hematuria   . GERD (gastroesophageal reflux disease)   . Hyperlipidemia   . Hypertension   . IBS (irritable bowel syndrome)   . Nephrolithiasis   . NIDDM (non-insulin dependent diabetes mellitus)   . Obesity, unspecified   . Pulmonary hypertension ([place])    a. WHO Group 3; b. 10/2016 Echo: EF 55-60%, no rwma, Gr1 DD, mild to mod AS, mean grad 18mHg, mildly dil RV w/ mildly reduced RV fxn, mildly dil RA. PASP 1150mg; c. 12/2017 RHC: RA 11, RV 92/12, PA 92/33(57), PCWP 15, Fick CO/CI 5.9/3.3. PVR 7.2 WU. Ao 93%, PA 62/64%.  . Marland KitchenVD (peripheral vascular disease) ([place]    Past Surgical History:  Procedure Laterality Date  . ABDOMINAL HYSTERECTOMY    . CARDIAC CATHETERIZATION    . CARPAL TUNNEL RELEASE    . CESAREAN SECTION    . ERCP N/A 03/17/2017   Procedure: ENDOSCOPIC RETROGRADE CHOLANGIOPANCREATOGRAPHY (ERCP);  Surgeon: WoLucilla LameMD;  [facility];  Service: Endoscopy;  Laterality: N/A;  . EUS N/A 03/13/2017   Procedure: FULL UPPER ENDOSCOPIC ULTRASOUND (EUS) RADIAL;  Surgeon: Burbridge, ReMurray HodgkinsMD;  Location: [facility] ENDOSCOPY;  Service: Endoscopy;  Laterality: N/A;  . RIGHT HEART CATH N/A 01/22/2018   Procedure: RIGHT  HEART CATH;  Surgeon: Jolaine Artist, MD;  Location: [place] [facility] CV LAB;  Service: Cardiovascular;  Laterality: N/A;  . RIGHT HEART CATHETERIZATION N/A 12/05/2014   Procedure: RIGHT HEART CATH;  Surgeon: Sinclair Grooms, MD;  Location: [facility] CATH LAB;  Service: Cardiovascular;  Laterality: N/A;     reports that she quit smoking about 15 years ago. Her smoking use included cigarettes. She has a 49.50 pack-year smoking history. She has never used smokeless tobacco. She reports that she does not drink alcohol and does not use drugs.  Allergies  Allergen Reactions   . Sertraline Hcl Other (See Comments)    Altered Mental Status    Family History  Problem Relation Age of Onset  . Diabetes Mother   . Cancer Mother        ovarian,melanoma  . Allergies Father   . Cancer Father        lung  . Cancer Maternal Grandmother        uterine  . Emphysema Paternal Grandfather   . Allergies Sister   . Breast cancer Neg Hx       Prior to Admission medications   Medication Sig Start Date End Date Taking? Authorizing Provider  albuterol (PROVENTIL) (2.5 MG/3ML) 0.083% nebulizer solution USE 1 VIAL (3ML) BY NEBULIZATION EVERY 6HOURS AS NEEDED FOR WHEEZING OR SHORTNESS OF BREATH 11/10/19   Martyn Ehrich, NP  ALPRAZolam Duanne Moron) 0.25 MG tablet Take 1 tablet (0.25 mg total) by mouth daily as needed for anxiety. 07/24/18   Venia Carbon, MD  aspirin EC 81 MG tablet Take 1 tablet (81 mg total) by mouth daily. 03/23/19   Wellington Hampshire, MD  calcitRIOL (ROCALTROL) 0.5 MCG capsule Take 0.5 mcg by mouth daily.  02/03/17   [provider]  cholecalciferol (VITAMIN D) 1000 units tablet Take 1,000 Units by mouth at bedtime.     [provider]  ferrous sulfate 325 (65 FE) MG tablet Take 1 tablet by mouth daily. 08/03/20 08/03/21  [provider]  gabapentin (NEURONTIN) 300 MG capsule TAKE 1 CAPSULE BY MOUTH IN THE MORNING AND 2 CAPSULES BY MOUTH AT BEDTIME 12/08/20   Viviana Simpler I, MD  glucose blood (ONE TOUCH ULTRA TEST) test strip USE 1 STRIP TO TEST BLOOD SUGAR TWICE DAILY DUE TO LOW BLOOD SUGAR. TAKES LANTUS INSULIN Dx Code E11.51 08/10/20   Viviana Simpler I, MD  insulin glargine (LANTUS SOLOSTAR) 100 UNIT/ML Solostar Pen Inject 15 Units into the skin daily. Patient taking differently: Inject 13 Units into the skin daily. 08/02/20   Venia Carbon, MD  Insulin Pen Needle (B-D UF III MINI PEN NEEDLES) 31G X 5 MM MISC USE 1 PEN NEEDLE TO INJECT INSULIN 01/11/20   Venia Carbon, MD  loratadine (CLARITIN) 10 MG tablet Take 10 mg by  mouth 2 (two) times daily.    Tisovec, Fransico Him, MD  lovastatin (MEVACOR) 40 MG tablet Take 2 tablets (80 mg total) by mouth at bedtime. 12/08/20   Venia Carbon, MD  methadone (DOLOPHINE) 10 MG tablet TAKE 2 TABLETS BY MOUTH EVERY 6 HOURS ASNEEDED 11/01/20   Venia Carbon, MD  metolazone (ZAROXOLYN) 2.5 MG tablet TAKE 1 TABLET BY MOUTH ONCE A WEEK EVERYTUESDAY 12/07/20   Bensimhon, Shaune Pascal, MD  metoprolol tartrate (LOPRESSOR) 25 MG tablet Take 1/2 tablet by mouth twice a day 05/09/20   Venia Carbon, MD  mometasone (ASMANEX, 120 METERED DOSES,) 220 MCG/INH inhaler Inhale 2 puffs into  the lungs 2 (two) times daily. 12/06/20   Olalere, Ernesto Rutherford, MD  nitroGLYCERIN (NITROSTAT) 0.4 MG SL tablet Place 1 tablet (0.4 mg total) under the tongue every 5 (five) minutes as needed for chest pain. 06/06/20   Wellington Hampshire, MD  pantoprazole (PROTONIX) 40 MG tablet Take 1 tablet by mouth twice a day 06/22/20   Venia Carbon, MD  potassium chloride SA (KLOR-CON) 20 MEQ tablet Take 2 tablets (40 mEq total) by mouth once a week. Every Tuesday Patient taking differently: Take 40 mEq by mouth once a week. Every Friday 12/05/20   Bensimhon, Shaune Pascal, MD  PROAIR HFA 108 [phone] Base) MCG/ACT inhaler USE 2 PUFFS EVERY 6 HOURS AS NEEDED FOR WHEEZING OR SHORTNESS OF BREATH 12/14/20   Laurin Coder, MD  Respiratory Therapy Supplies (FLUTTER) DEVI Use as directed 12/10/19   Tyler Pita, MD  Tiotropium Bromide-Olodaterol (STIOLTO RESPIMAT) 2.5-2.5 MCG/ACT AERS INHALE 2 PUFFS BY MOUTH INTO THE LUNGS DAILY 12/14/20   Sherrilyn Rist A, MD  torsemide (DEMADEX) 20 MG tablet Take 3 tablets (60 mg total) by mouth 2 (two) times daily. Hold afternoon dose if your weight is under 150# at home 10/10/20   Viviana Simpler I, MD  Treprostinil (TYVASO) 0.6 MG/ML SOLN Inhale 18 mcg into the lungs 4 (four) times daily.    [provider]    Physical Exam: Vitals:   12/09/2020 1602 12/12/2020 1837  BP: (!) 124/113  126/74  Pulse: 72 75  Resp: 17 18  Temp: 98.6 F (37 C) 98.8 F (37.1 C)  TempSrc:  Oral  SpO2: 95% 96%  Weight: 67.5 kg   Height: _0  (1.549 m)      Vitals:   11/30/2020 1602 12/02/2020 1837  BP: (!) 124/113 126/74  Pulse: 72 75  Resp: 17 18  Temp: 98.6 F (37 C) 98.8 F (37.1 C)  TempSrc:  Oral  SpO2: 95% 96%  Weight: 67.5 kg   Height: _1  (1.549 m)       Constitutional: Alert and oriented x 3 . Not in any apparent distress HEENT:      Head: Normocephalic and atraumatic.         Eyes: PERLA, EOMI, Conjunctivae are normal. Sclera is non-icteric.       Mouth/Throat: Mucous membranes are moist.       Neck: Supple with no signs of meningismus. Cardiovascular: Regular rate and rhythm. No murmurs, gallops, or rubs. PT or DP pulses difficult to palpate . No JVD. No 1+LE edema Respiratory: Respiratory effort normal .Lungs sounds clear bilaterally. No wheezes, crackles, or rhonchi.  Gastrointestinal: Soft, non tender, and non distended with positive bowel sounds.  Genitourinary: No CVA tenderness. Musculoskeletal:  Swelling bilateral lower extremities, with erythema  ulceration of the left lateral ankle no purulent drainage.  No joint effusions. Neurologic:  Face is symmetric. Moving all extremities. No gross focal neurologic deficits . Skin: Erythroderma bilateral lower extremities left greater than right, 1 cm ulcer left Psychiatric: Mood and affect are normal    Labs on Admission: I have personally reviewed following labs and imaging studies  CBC: Recent Labs  Lab 12/09/2020 1604  WBC 9.2  NEUTROABS 7.3  HGB 14.2  HCT 44.0  MCV 93.0  PLT 371   Basic Metabolic Panel: Recent Labs  Lab 11/30/2020 1604  NA 137  K 3.5  CL 90*  CO2 35*  GLUCOSE 53*  BUN 38*  CREATININE 2.02*  CALCIUM 9.5   GFR: Estimated  Creatinine Clearance: 23.8 mL/min (A) (by C-G formula based on SCr of 2.02 mg/dL (H)). Liver Function Tests: Recent Labs  Lab 12/19/2020 1604  AST 33   ALT 19  ALKPHOS 52  BILITOT 1.7*  PROT 6.7  ALBUMIN 3.3*   No results for input(s): LIPASE, AMYLASE in the last 168 hours. No results for input(s): AMMONIA in the last 168 hours. Coagulation Profile: Recent Labs  Lab 12/01/2020 1604  INR 1.7*   Cardiac Enzymes: No results for input(s): CKTOTAL, CKMB, CKMBINDEX, TROPONINI in the last 168 hours. BNP (last 3 results) No results for input(s): PROBNP in the last 8760 hours. HbA1C: No results for input(s): HGBA1C in the last 72 hours. CBG: No results for input(s): GLUCAP in the last 168 hours. Lipid Profile: No results for input(s): CHOL, HDL, LDLCALC, TRIG, CHOLHDL, LDLDIRECT in the last 72 hours. Thyroid Function Tests: No results for input(s): TSH, T4TOTAL, FREET4, T3FREE, THYROIDAB in the last 72 hours. Anemia Panel: No results for input(s): VITAMINB12, FOLATE, FERRITIN, TIBC, IRON, RETICCTPCT in the last 72 hours. Urine analysis:    Component Value Date/Time   COLORURINE YELLOW (A) 11/24/2020 1728   APPEARANCEUR CLEAR (A) 11/24/2020 1728   LABSPEC 1.006 11/24/2020 1728   PHURINE 6.0 11/24/2020 1728   GLUCOSEU NEGATIVE 11/24/2020 1728   GLUCOSEU 100 (A) 03/21/2017 1120   HGBUR NEGATIVE 11/24/2020 1728   BILIRUBINUR NEGATIVE 11/24/2020 1728   KETONESUR NEGATIVE 11/24/2020 1728   PROTEINUR 30 (A) 11/24/2020 1728   UROBILINOGEN 0.2 03/21/2017 1120   NITRITE NEGATIVE 11/24/2020 1728   LEUKOCYTESUR NEGATIVE 11/24/2020 1728    Radiological Exams on Admission: DG Foot Complete Left  Result Date: 12/11/2020 CLINICAL DATA:  Foot cellulitis with ankle ulceration EXAM: LEFT FOOT - COMPLETE 3+ VIEW COMPARISON:  None. FINDINGS: No fracture or malalignment. Mild degenerative changes at the first MTP joint. No soft tissue emphysema. No bony destructive change or periostitis. IMPRESSION: Mild degenerative changes at the first MTP joint. No acute osseous abnormality. Electronically Signed   By: Donavan Foil M.D.   On: 12/24/2020 20:43      Assessment/Plan 65 year old female with history of dCHF, CAD, CKD 4, COPD on home O2 at 4 L, HTN, DM2 with polyneuropathy and PVD, hospitalized for right lower extremity cellulitis from 1/28-2/1  sent in by her podiatrist for IV antibiotics and vascular consult due to recurrent cellulitis, this time of her left lower extremity.      Cellulitis of left lower leg   Recurrent cellulitis   PVD (peripheral vascular disease) ([place]) -Patient with frequent cellulitis in the setting of history of PVD recently hospitalized from 1/28-2/1 with RLE cellulitis now presenting with pain redness and swelling LLE. -Foot x-ray showing no evidence of osteomyelitis -No sepsis criteria -IV Zosyn and vancomycin -Keep legs elevated -Podiatry and vascular consults    Hypoglycemia associated with diabetes ([place])   Type 2 diabetes mellitus with polyneuropathy, controlled (HCC) -Blood sugar 53 on arrival -Hypoglycemia protocol and start sliding scale insulin coverage once blood sugar stabilized -Continue gabapentin for diabetic polyneuropathy    Essential hypertension, benign -Continue metoprolol    CAD (coronary artery disease) -Continue aspirin lovastatin, metoprolol, sublingual nitro as needed -Patient has no complaints of chest pain    Chronic renal disease, stage IV (HCC) -Renal function at baseline -Continue vitamin D, Rocaltrol    Chronic diastolic heart failure (HCC) -Appears euvolemic and stable -Continue metoprolol,. -On torsemide and metolazone but not needed at this time is euvolemic, especially in view of renal function -  Not on ACE inhibitor due to renal function    Chronic respiratory failure with hypoxia (HCC)   COPD (chronic obstructive pulmonary disease) (HCC) -COPD not acutely exacerbated and O2 use at baseline -DuoNebs as needed -Continue home inhalers  Chronic pain Anxiety -Continue methadone and Xanax once verified    DVT prophylaxis: Lovenox  Code Status: full code   Family Communication:  none  Disposition Plan: Back to previous home environment Consults called: Podiatry and vascular Status:At the time of admission, it appears that the appropriate admission status for this patient is INPATIENT. This is judged to be reasonable and necessary in order to provide the required intensity of service to ensure the patient's safety given the presenting symptoms, physical exam findings, and initial radiographic and laboratory data in the context of their  Comorbid conditions.   Patient requires inpatient status due to high intensity of service, high risk for further deterioration and high frequency of surveillance required.   I certify that at the point of admission it is my clinical judgment that the patient will require inpatient hospital care spanning beyond [place] MD [facility]     11/29/2020, 9:18 PM

## 2020-12-19 ENCOUNTER — Other Ambulatory Visit (INDEPENDENT_AMBULATORY_CARE_PROVIDER_SITE_OTHER): Payer: Self-pay | Admitting: Vascular Surgery

## 2020-12-19 ENCOUNTER — Ambulatory Visit: Payer: PPO | Admitting: Cardiovascular Disease

## 2020-12-19 DIAGNOSIS — Z4659 Encounter for fitting and adjustment of other gastrointestinal appliance and device: Secondary | ICD-10-CM

## 2020-12-19 DIAGNOSIS — E1142 Type 2 diabetes mellitus with diabetic polyneuropathy: Secondary | ICD-10-CM

## 2020-12-19 DIAGNOSIS — J439 Emphysema, unspecified: Secondary | ICD-10-CM | POA: Diagnosis present

## 2020-12-19 DIAGNOSIS — I2510 Atherosclerotic heart disease of native coronary artery without angina pectoris: Secondary | ICD-10-CM | POA: Diagnosis not present

## 2020-12-19 DIAGNOSIS — Z01818 Encounter for other preprocedural examination: Secondary | ICD-10-CM | POA: Diagnosis not present

## 2020-12-19 DIAGNOSIS — L03116 Cellulitis of left lower limb: Secondary | ICD-10-CM | POA: Diagnosis not present

## 2020-12-19 DIAGNOSIS — I739 Peripheral vascular disease, unspecified: Secondary | ICD-10-CM

## 2020-12-19 DIAGNOSIS — L0390 Cellulitis, unspecified: Secondary | ICD-10-CM

## 2020-12-19 DIAGNOSIS — J431 Panlobular emphysema: Secondary | ICD-10-CM

## 2020-12-19 DIAGNOSIS — I5032 Chronic diastolic (congestive) heart failure: Secondary | ICD-10-CM | POA: Diagnosis present

## 2020-12-19 DIAGNOSIS — E1165 Type 2 diabetes mellitus with hyperglycemia: Secondary | ICD-10-CM

## 2020-12-19 DIAGNOSIS — L03115 Cellulitis of right lower limb: Secondary | ICD-10-CM

## 2020-12-19 DIAGNOSIS — G894 Chronic pain syndrome: Secondary | ICD-10-CM | POA: Diagnosis present

## 2020-12-19 DIAGNOSIS — J9611 Chronic respiratory failure with hypoxia: Secondary | ICD-10-CM

## 2020-12-19 DIAGNOSIS — E1149 Type 2 diabetes mellitus with other diabetic neurological complication: Secondary | ICD-10-CM

## 2020-12-19 DIAGNOSIS — I10 Essential (primary) hypertension: Secondary | ICD-10-CM

## 2020-12-19 LAB — CBC
HCT: 40.7 % (ref 36.0–46.0)
HCT: 39.5 % (ref 36.0–46.0)
Hemoglobin: 12.9 g/dL (ref 12.0–15.0)
Hemoglobin: 13.1 g/dL (ref 12.0–15.0)
MCH: 29.7 pg (ref 26.0–34.0)
MCH: 30.8 pg (ref 26.0–34.0)
MCHC: 31.7 g/dL (ref 30.0–36.0)
MCHC: 33.2 g/dL (ref 30.0–36.0)
MCV: 93.6 fL (ref 80.0–100.0)
MCV: 92.7 fL (ref 80.0–100.0)
Platelets: 202 10*3/uL (ref 150–400)
Platelets: 191 10*3/uL (ref 150–400)
RBC: 4.35 MIL/uL (ref 3.87–5.11)
RBC: 4.26 MIL/uL (ref 3.87–5.11)
RDW: 15.5 % (ref 11.5–15.5)
RDW: 15.6 % — ABNORMAL HIGH (ref 11.5–15.5)
WBC: 7.7 10*3/uL (ref 4.0–10.5)
WBC: 11.6 10*3/uL — ABNORMAL HIGH (ref 4.0–10.5)
nRBC: 0 % (ref 0.0–0.2)
nRBC: 0 % (ref 0.0–0.2)

## 2020-12-19 LAB — BASIC METABOLIC PANEL
Anion gap: 12 (ref 5–15)
BUN: 35 mg/dL — ABNORMAL HIGH (ref 8–23)
CO2: 33 mmol/L — ABNORMAL HIGH (ref 22–32)
Calcium: 8.8 mg/dL — ABNORMAL LOW (ref 8.9–10.3)
Chloride: 90 mmol/L — ABNORMAL LOW (ref 98–111)
Creatinine, Ser: 2.07 mg/dL — ABNORMAL HIGH (ref 0.44–1.00)
GFR, Estimated: 26 mL/min — ABNORMAL LOW (ref >60)
Glucose, Bld: 312 mg/dL — ABNORMAL HIGH (ref 70–99)
Potassium: 3.1 mmol/L — ABNORMAL LOW (ref 3.5–5.1)
Sodium: 135 mmol/L (ref 135–145)

## 2020-12-19 LAB — URINALYSIS, COMPLETE (UACMP) WITH MICROSCOPIC
Bacteria, UA: NONE SEEN
Bilirubin Urine: NEGATIVE
Glucose, UA: NEGATIVE mg/dL
Ketones, ur: NEGATIVE mg/dL
Leukocytes,Ua: NEGATIVE
Nitrite: NEGATIVE
Protein, ur: 30 mg/dL — AB
Specific Gravity, Urine: 1.008 (ref 1.005–1.030)
Squamous Epithelial / HPF: NONE SEEN (ref 0–5)
pH: 7 (ref 5.0–8.0)

## 2020-12-19 LAB — GLUCOSE, CAPILLARY
Glucose-Capillary: 172 mg/dL — ABNORMAL HIGH (ref 70–99)
Glucose-Capillary: 27 mg/dL — CL (ref 70–99)
Glucose-Capillary: 99 mg/dL (ref 70–99)
Glucose-Capillary: 269 mg/dL — ABNORMAL HIGH (ref 70–99)
Glucose-Capillary: 354 mg/dL — ABNORMAL HIGH (ref 70–99)
Glucose-Capillary: 435 mg/dL — ABNORMAL HIGH (ref 70–99)
Glucose-Capillary: 259 mg/dL — ABNORMAL HIGH (ref 70–99)
Glucose-Capillary: 193 mg/dL — ABNORMAL HIGH (ref 70–99)
Glucose-Capillary: 182 mg/dL — ABNORMAL HIGH (ref 70–99)
Glucose-Capillary: 193 mg/dL — ABNORMAL HIGH (ref 70–99)
Glucose-Capillary: 238 mg/dL — ABNORMAL HIGH (ref 70–99)

## 2020-12-19 LAB — CREATININE, SERUM
Creatinine, Ser: 1.97 mg/dL — ABNORMAL HIGH (ref 0.44–1.00)
GFR, Estimated: 27 mL/min — ABNORMAL LOW (ref >60)

## 2020-12-19 LAB — LACTIC ACID, PLASMA
Lactic Acid, Venous: 1.3 mmol/L (ref 0.5–1.9)
Lactic Acid, Venous: 1.5 mmol/L (ref 0.5–1.9)
Lactic Acid, Venous: 1.7 mmol/L (ref 0.5–1.9)

## 2020-12-19 LAB — CBG MONITORING, ED: Glucose-Capillary: 448 mg/dL — ABNORMAL HIGH (ref 70–99)

## 2020-12-19 LAB — SARS CORONAVIRUS 2 (TAT 6-24 HRS): SARS Coronavirus 2: NEGATIVE

## 2020-12-19 MED ORDER — PRAVASTATIN SODIUM 20 MG PO TABS
80.0000 mg | ORAL_TABLET | Freq: Every day | ORAL | Status: DC
  Administered 2020-12-21 – 2020-12-22 (×2): 80 mg via ORAL
  Filled 2020-12-19 (×2): qty 4

## 2020-12-19 MED ORDER — VITAMIN D 25 MCG (1000 UNIT) PO TABS
1000.0000 [IU] | ORAL_TABLET | Freq: Every day | ORAL | Status: DC
  Administered 2020-12-19 – 2020-12-22 (×4): 1000 [IU] via ORAL
  Filled 2020-12-19 (×4): qty 1

## 2020-12-19 MED ORDER — PANTOPRAZOLE SODIUM 40 MG PO TBEC
40.0000 mg | DELAYED_RELEASE_TABLET | Freq: Two times a day (BID) | ORAL | Status: DC
  Administered 2020-12-19 – 2020-12-23 (×8): 40 mg via ORAL
  Filled 2020-12-19 (×8): qty 1

## 2020-12-19 MED ORDER — INSULIN REGULAR HUMAN 100 UNIT/ML IJ SOLN: 4.0000 [IU] | Freq: Once | INTRAMUSCULAR | Status: DC

## 2020-12-19 MED ORDER — NITROGLYCERIN 0.4 MG SL SUBL: 0.4000 mg | SUBLINGUAL_TABLET | SUBLINGUAL | Status: DC | PRN

## 2020-12-19 MED ORDER — GABAPENTIN 300 MG PO CAPS
600.0000 mg | ORAL_CAPSULE | Freq: Every day | ORAL | Status: DC
  Administered 2020-12-19 – 2020-12-22 (×4): 600 mg via ORAL
  Filled 2020-12-19 (×4): qty 2

## 2020-12-19 MED ORDER — METHADONE HCL 10 MG PO TABS
20.0000 mg | ORAL_TABLET | Freq: Four times a day (QID) | ORAL | Status: DC | PRN
  Administered 2020-12-21 – 2020-12-22 (×4): 20 mg via ORAL
  Filled 2020-12-19 (×5): qty 2

## 2020-12-19 MED ORDER — GABAPENTIN 300 MG PO CAPS
300.0000 mg | ORAL_CAPSULE | Freq: Every day | ORAL | Status: DC
  Administered 2020-12-20 – 2020-12-23 (×4): 300 mg via ORAL
  Filled 2020-12-19 (×4): qty 1

## 2020-12-19 MED ORDER — POTASSIUM CHLORIDE CRYS ER 20 MEQ PO TBCR
50.0000 meq | EXTENDED_RELEASE_TABLET | Freq: Once | ORAL | Status: AC
  Administered 2020-12-19: 50 meq via ORAL
  Filled 2020-12-19: qty 2

## 2020-12-19 MED ORDER — INSULIN ASPART 100 UNIT/ML ~~LOC~~ SOLN
4.0000 [IU] | Freq: Once | SUBCUTANEOUS | Status: AC
  Administered 2020-12-19: 4 [IU] via INTRAVENOUS
  Filled 2020-12-19: qty 1

## 2020-12-19 MED ORDER — SODIUM CHLORIDE 0.9 % IV SOLN: INTRAVENOUS | Status: DC

## 2020-12-19 MED ORDER — ALBUTEROL SULFATE HFA 108 (90 BASE) MCG/ACT IN AERS
2.0000 | INHALATION_SPRAY | Freq: Four times a day (QID) | RESPIRATORY_TRACT | Status: DC | PRN
  Filled 2020-12-19: qty 6.7

## 2020-12-19 MED ORDER — ASPIRIN EC 81 MG PO TBEC
81.0000 mg | DELAYED_RELEASE_TABLET | Freq: Every day | ORAL | Status: DC
  Administered 2020-12-19 – 2020-12-23 (×4): 81 mg via ORAL
  Filled 2020-12-19 (×5): qty 1

## 2020-12-19 MED ORDER — TREPROSTINIL 0.6 MG/ML IN SOLN
18.0000 ug | Freq: Four times a day (QID) | RESPIRATORY_TRACT | Status: DC
  Administered 2020-12-21 – 2020-12-22 (×8): 18 ug via RESPIRATORY_TRACT
  Filled 2020-12-19 (×3): qty 2.9

## 2020-12-19 MED ORDER — GLUCAGON HCL RDNA (DIAGNOSTIC) 1 MG IJ SOLR
INTRAMUSCULAR | Status: AC
  Filled 2020-12-19: qty 1

## 2020-12-19 MED ORDER — GABAPENTIN 300 MG PO CAPS
300.0000 mg | ORAL_CAPSULE | ORAL | Status: DC
Start: 2020-12-19 — End: 2020-12-19

## 2020-12-19 MED ORDER — LORATADINE 10 MG PO TABS
10.0000 mg | ORAL_TABLET | Freq: Two times a day (BID) | ORAL | Status: DC
  Administered 2020-12-19 – 2020-12-23 (×8): 10 mg via ORAL
  Filled 2020-12-19 (×8): qty 1

## 2020-12-19 MED ORDER — GLUCOSE 40 % PO GEL
ORAL | Status: AC
  Filled 2020-12-19: qty 1

## 2020-12-19 MED ORDER — FERROUS SULFATE 325 (65 FE) MG PO TABS
325.0000 mg | ORAL_TABLET | Freq: Every day | ORAL | Status: DC
  Administered 2020-12-20 – 2020-12-23 (×4): 325 mg via ORAL
  Filled 2020-12-19 (×4): qty 1

## 2020-12-19 MED ORDER — GABAPENTIN 300 MG PO CAPS: 300.0000 mg | ORAL_CAPSULE | Freq: Three times a day (TID) | ORAL | Status: DC

## 2020-12-19 MED ORDER — ALPRAZOLAM 0.25 MG PO TABS
0.2500 mg | ORAL_TABLET | Freq: Every day | ORAL | Status: DC | PRN
  Administered 2020-12-21 – 2020-12-22 (×2): 0.25 mg via ORAL
  Filled 2020-12-19 (×2): qty 1

## 2020-12-19 MED ORDER — CALCITRIOL 0.25 MCG PO CAPS
0.5000 ug | ORAL_CAPSULE | Freq: Every day | ORAL | Status: DC
  Administered 2020-12-19 – 2020-12-23 (×5): 0.5 ug via ORAL
  Filled 2020-12-19 (×5): qty 2

## 2020-12-19 MED ORDER — INSULIN ASPART 100 UNIT/ML ~~LOC~~ SOLN
0.0000 [IU] | SUBCUTANEOUS | Status: DC
  Administered 2020-12-19: 5 [IU] via SUBCUTANEOUS
  Filled 2020-12-19: qty 1

## 2020-12-19 NOTE — Progress Notes (Signed)
   12/19/20 0200  Clinical Encounter Type  Visited With Patient not available;Health care provider  Visit Type Initial;Code  Referral From Nurse   Rapid Response page received for the patient. Upon arrival, the medical team was assessing the patient's status and administering care. This chaplain maintained pastoral presence outside the patient's room, offering silent prayer. No family present at this time. No additional needs at this time. Will continue to follow. Support provided in the form of compassionate ministerial presence and prayer.  Gennaro Africa, Chaplain

## 2020-12-19 NOTE — Significant Event (Signed)
Rapid Response Event Note   Reason for Call :  AMS  Patient jerking Glucose <10  Initial Focused Assessment:  Patient nonverbal only responds to patient  Patient jerking Patient HR130s BP 137/75 O2 90 on 4L Glucose <10  Staff administering Glucagon on RRT arrival to room.  Interventions:  Glucagon given 1 amp of D5 given Patient became verbal, able to follow commands and answer questions appropriately  Glucose 27 Dextrose oral gel given Glucose 99 BP 138/78 HR 100 O2 90 on 4L   Plan of Care:  Dr Damita Dunnings at bedside as oral gel is given and glucose 99 [place] order for 200MLs of D10 to be given over an hour and to monitor blood glucose frequent.      MD Notified: Dr Damita Dunnings  Call [place]  Gonzella Lex, RN

## 2020-12-19 NOTE — Progress Notes (Signed)
Post rapid check up  Patient alert and oriented resting comfortable in bed. Complains of no pain and answers/follows commands approprietly .  Blood sugars are trending up Primary nurse aware and Damita Dunnings MD also aware. Plans to treat blood sugar if they  not start to trend down.  No other problems reported by primary nurse

## 2020-12-19 NOTE — ED Notes (Signed)
Secure chat sent to admitting MD r/t glucose of 448 (see chart). Awaiting response.

## 2020-12-19 NOTE — Consult Note (Signed)
[facility] Vascular Consult Note  MRN : [id number]  Lindsay Harmon is a 65 y.o. (16-Feb-1956) female who presents with chief complaint of  Chief Complaint  Patient presents with  . Recurrent Skin Infections  . Leg Problem   History of Present Illness:  Lindsay Harmon is a 65 year old female with medical history significant for Chronic diastolic heart failure, CAD, CKD 4, COPD on home O2 at 4 L, HTN, DM2 with polyneuropathy and PVD, hospitalized for right lower extremity cellulitis from 1/28-2/1 who was sent in by her podiatrist for IV antibiotics and vascular due to recurrent cellulitis, this time of her left lower extremity.    Patient stated she noticed redness and swelling in the leg starting 3 to 4 days ago.  Cellulitis of the right lower extremity has improved.  She denies fever or chills, cough or shortness of breath beyond her baseline.  Denies chest pain.  Denies nausea vomiting, abdominal pain or diarrhea.  Vascular surgery was consulted by Dr. Damita Dunnings for possible endovascular intervention.  Current Facility-Administered Medications  Medication Dose Route Frequency Provider Last Rate Last Admin  . acetaminophen (TYLENOL) tablet 650 mg  650 mg Oral Q6H PRN Athena Masse, MD       Or  . acetaminophen (TYLENOL) suppository 650 mg  650 mg Rectal Q6H PRN Athena Masse, MD      . enoxaparin (LOVENOX) injection 30 mg  30 mg Subcutaneous Q24H Athena Masse, MD   30 mg at 12/19/20 0016  . HYDROcodone-acetaminophen (NORCO/VICODIN) 5-325 MG per tablet 1-2 tablet  1-2 tablet Oral Q4H PRN Athena Masse, MD   2 tablet at 12/19/20 1244  . insulin aspart (novoLOG) injection 0-15 Units  0-15 Units Subcutaneous TID WC Athena Masse, MD   3 Units at 12/19/20 1244  . insulin aspart (novoLOG) injection 0-5 Units  0-5 Units Subcutaneous QHS Judd Gaudier V, MD      . ipratropium-albuterol (DUONEB) 0.5-2.5 (3) MG/3ML nebulizer solution 3 mL  3 mL Nebulization Q6H PRN  Judd Gaudier V, MD      . morphine 2 MG/ML injection 2 mg  2 mg Intravenous Q2H PRN Athena Masse, MD      . ondansetron [facility]) tablet 4 mg  4 mg Oral Q6H PRN Athena Masse, MD       Or  . ondansetron [facility]) injection 4 mg  4 mg Intravenous Q6H PRN Athena Masse, MD      . Derrill Memo ON 12/13/2020] vancomycin (VANCOCIN) IVPB 1000 mg/200 mL premix  1,000 mg Intravenous Q48H Benita Gutter, [facility]       Past Medical History:  Diagnosis Date  . Allergy   . Aortic atherosclerosis ([place])   . Arthritis   . CKD (chronic kidney disease), stage IV ([place])   . COPD (chronic obstructive pulmonary disease) ([place])    a. 11/2014 PFT's: FEV1 0.87 (38%), FVC 1.41 (48%), FEF 25-75 0.41 (19%). Unable to perform DLCO; b. 12/2017 Simple spirometry ration 67%. FEV1 1.21 L+53% predicted. FVC 1.79L 61% predicted.  . Coronary artery calcification seen on CT scan    a. 03/2017 CT Chest.  . Depression   . Diabetic neuropathy ([place])   . Erythrocytosis 12/13/2019  . Esophageal stricture   . Familial hematuria   . GERD (gastroesophageal reflux disease)   . Hyperlipidemia   . Hypertension   . IBS (irritable bowel syndrome)   . Nephrolithiasis   . NIDDM (non-insulin dependent diabetes  mellitus)   . Obesity, unspecified   . Pulmonary hypertension ([place])    a. WHO Group 3; b. 10/2016 Echo: EF 55-60%, no rwma, Gr1 DD, mild to mod AS, mean grad 4mHg, mildly dil RV w/ mildly reduced RV fxn, mildly dil RA. PASP 1166mg; c. 12/2017 RHC: RA 11, RV 92/12, PA 92/33(57), PCWP 15, Fick CO/CI 5.9/3.3. PVR 7.2 WU. Ao 93%, PA 62/64%.  . Marland KitchenVD (peripheral vascular disease) [place]   Past Surgical History:  Procedure Laterality Date  . ABDOMINAL HYSTERECTOMY    . CARDIAC CATHETERIZATION    . CARPAL TUNNEL RELEASE    . CESAREAN SECTION    . ERCP N/A 03/17/2017   Procedure: ENDOSCOPIC RETROGRADE CHOLANGIOPANCREATOGRAPHY (ERCP);  Surgeon: WoLucilla LameMD;  [facility];  Service: Endoscopy;  Laterality: N/A;  . EUS N/A  03/13/2017   Procedure: FULL UPPER ENDOSCOPIC ULTRASOUND (EUS) RADIAL;  Surgeon: Burbridge, ReMurray HodgkinsMD;  [facility];  Service: Endoscopy;  Laterality: N/A;  . RIGHT HEART CATH N/A 01/22/2018   Procedure: RIGHT HEART CATH;  Surgeon: BeJolaine ArtistMD;  [facility];  Service: Cardiovascular;  Laterality: N/A;  . RIGHT HEART CATHETERIZATION N/A 12/05/2014   Procedure: RIGHT HEART CATH;  Surgeon: HeSinclair GroomsMD;  [facility];  Service: Cardiovascular;  Laterality: N/A;   Social History Social History   Tobacco Use  . Smoking status: Former Smoker    Packs/day: 1.50    Years: 33.00    Pack years: 49.50    Types: Cigarettes    Quit date: 07/28/2005    Years since quitting: 15.4  . Smokeless tobacco: Never Used  Vaping Use  . Vaping Use: Never used  Substance Use Topics  . Alcohol use: No    Alcohol/week: 0.0 standard drinks    Comment: heavy in the past  . Drug use: No   Family History Family History  Problem Relation Age of Onset  . Diabetes Mother   . Cancer Mother        ovarian,melanoma  . Allergies Father   . Cancer Father        lung  . Cancer Maternal Grandmother        uterine  . Emphysema Paternal Grandfather   . Allergies Sister   . Breast cancer Neg Hx   Denies family history of peripheral artery disease, venous disease or renal disease.  Allergies  Allergen Reactions  . Sertraline Hcl Other (See Comments)    Altered Mental Status   REVIEW OF SYSTEMS (Negative unless checked)  Constitutional: _0 Weight loss  _1 Fever  _2 Chills Cardiac: _3 Chest pain   _4 Chest pressure   _5 Palpitations   _6 Shortness of breath when laying flat   _7 Shortness of breath at rest   _8 Shortness of breath with exertion. Vascular:  _9 Pain in legs with walking   _10 Pain in legs at rest   _11 Pain in legs when laying flat   _12 Claudication   _13 Pain in feet when walking  _14 Pain in feet at rest  _15 Pain in feet when laying flat   _16 History of DVT    _17 Phlebitis   _18 Swelling in legs   _19 Varicose veins   _20 Non-healing ulcers Pulmonary:   _21 Uses home oxygen   _22 Productive cough   _23 Hemoptysis   _24 Wheeze  _25 COPD   _26 Asthma Neurologic:  _27 Dizziness  _28 Blackouts   _29 Seizures   _30 History of stroke   _31 History of TIA  _32 Aphasia   _33 Temporary blindness   _34 Dysphagia   _35 Weakness or  numbness in arms   _0 Weakness or numbness in legs Musculoskeletal:  _1 Arthritis   _2 Joint swelling   _3 Joint pain   _4 Low back pain Hematologic:  _5 Easy bruising  _6 Easy bleeding   _7 Hypercoagulable state   _8 Anemic  _9 Hepatitis Gastrointestinal:  _10 Blood in stool   _11 Vomiting blood  _12 Gastroesophageal reflux/heartburn   _13 Difficulty swallowing. Genitourinary:  _14 Chronic kidney disease   _15 Difficult urination  _16 Frequent urination  _17 Burning with urination   _18 Blood in urine Skin:  _19 Rashes   _20 Ulcers   _21 Wounds Psychological:  _22 History of anxiety   _23  History of major depression.  Physical Examination  Vitals:   12/19/20 0404 12/19/20 0731 12/19/20 1124 12/19/20 1531  BP: (!) 100/47 (!) 94/55 101/63 (!) 109/47  Pulse: 85 71 73 73  Resp: _24 Temp: 98.6 F (37 C) (!) 97.4 F (36.3 C) 98.2 F (36.8 C) 99.4 F (37.4 C)  TempSrc: Oral Oral Oral Oral  SpO2: (!) 89% 96% 92% 97%  Weight:      Height:       Body mass index is 28.12 kg/m. Gen:  WD/WN, NAD Head: [place]/AT, No temporalis wasting. Prominent temp pulse not noted. Ear/Nose/Throat: Hearing grossly intact, nares w/o erythema or drainage, oropharynx w/o Erythema/Exudate Eyes: Sclera non-icteric, conjunctiva clear Neck: Trachea midline.  No JVD.  Pulmonary:  Good air movement, respirations not labored, equal bilaterally.  Cardiac: RRR, normal S1, S2. Vascular:  Vessel Right Left  Radial Palpable Palpable  Ulnar Palpable Palpable  Brachial Palpable Palpable  Carotid Palpable, without bruit Palpable, without bruit  Aorta Not palpable N/A  Femoral Palpable Palpable  Popliteal Palpable Palpable   PT Non-Palpable Non-Palpable  DP Non-Palpable Non-Palpable   Left lower extremity: Thigh soft.  Calf soft.  Erythematous.  Soft.  Extremities warm distally toes however unable to palpate pedal pulses  Right lower extremity: Thigh soft.  Calf soft.  Erythematous.  Soft.  Extremities warm distally toes however unable to palpate pedal pulses  Gastrointestinal: soft, non-tender/non-distended. No guarding/reflex.  Musculoskeletal: M/S 5/5 throughout.  Extremities without ischemic changes.  No deformity or atrophy. No edema. Neurologic: Sensation grossly intact in extremities.  Symmetrical.  Speech is fluent. Motor exam as listed above. Psychiatric: Judgment intact, Mood & affect appropriate for pt's clinical situation. Dermatologic: No rashes or ulcers noted.  No cellulitis or open wounds. Lymph : No Cervical, Axillary, or Inguinal lymphadenopathy.  CBC Lab Results  Component Value Date   WBC 11.6 (H) 12/19/2020   HGB 13.1 12/19/2020   HCT 39.5 12/19/2020   MCV 92.7 12/19/2020   PLT 191 12/19/2020   BMET    Component Value Date/Time   NA 135 12/19/2020 0604   K 3.1 (L) 12/19/2020 0604   CL 90 (L) 12/19/2020 0604   CO2 33 (H) 12/19/2020 0604   GLUCOSE 312 (H) 12/19/2020 0604   BUN 35 (H) 12/19/2020 0604   CREATININE 2.07 (H) 12/19/2020 0604   CALCIUM 8.8 (L) 12/19/2020 0604   GFRNONAA 26 (L) 12/19/2020 0604   GFRAA 23 (L) 04/18/2020 1219   Estimated Creatinine Clearance: 23.2 mL/min (A) (by C-G formula based on SCr of 2.07 mg/dL (H)).  COAG Lab Results  Component Value Date   INR 1.7 (H) 12/08/2020   INR 1.6 (H) 11/25/2020   INR 1.3 (H) 01/03/2020   Radiology US Venous Img Lower Unilateral Right  Result Date: 11/24/2020 CLINICAL DATA:  Erythema of the right lower extremity EXAM: RIGHT LOWER EXTREMITY VENOUS DOPPLER ULTRASOUND TECHNIQUE: Gray-scale sonography with compression, as  well as color and duplex ultrasound, were performed to evaluate the deep venous system(s) from  the level of the common femoral vein through the popliteal and proximal calf veins. COMPARISON:  None. FINDINGS: VENOUS Normal compressibility of the common femoral, superficial femoral, and popliteal veins, as well as the visualized calf veins. Visualized portions of profunda femoral vein and great saphenous vein unremarkable. No filling defects to suggest DVT on grayscale or color Doppler imaging. Doppler waveforms show normal direction of venous flow, normal respiratory plasticity and response to augmentation. Limited views of the contralateral common femoral vein are unremarkable. OTHER Lower extremity edema is noted. Limitations: none IMPRESSION: 1. No evidence for DVT. 2. There is nonspecific lower extremity edema. Electronically Signed   By: Constance Holster M.D.   On: 11/24/2020 19:12   US ARTERIAL ABI (SCREENING LOWER EXTREMITY)  Result Date: 11/25/2020 CLINICAL DATA:  Peripheral artery disease EXAM: NONINVASIVE PHYSIOLOGIC VASCULAR STUDY OF BILATERAL LOWER EXTREMITIES TECHNIQUE: Evaluation of both lower extremities were performed at rest, including calculation of ankle-brachial indices with single level Doppler, pressure and pulse volume recording. COMPARISON:  None. FINDINGS: Right ABI:  0.5 Left ABI:  0.61 Right Lower Extremity:  Monophasic waveforms Left Lower Extremity:  Monophasic waveforms IMPRESSION: Decreased ABIs bilateral consistent with moderate arterial disease. Monophasic waveforms are noted at the ankles bilaterally. Electronically Signed   By: [name] M.D.   On: 11/25/2020 02:22   DG Foot Complete Left  Result Date: 12/14/2020 CLINICAL DATA:  Foot cellulitis with ankle ulceration EXAM: LEFT FOOT - COMPLETE 3+ VIEW COMPARISON:  None. FINDINGS: No fracture or malalignment. Mild degenerative changes at the first MTP joint. No soft tissue emphysema. No bony destructive change or periostitis. IMPRESSION: Mild degenerative changes at the first MTP joint. No acute osseous  abnormality. Electronically Signed   By: Donavan Foil M.D.   On: 12/08/2020 20:43   DG Foot Complete Right  Result Date: 11/27/2020 Please see detailed radiograph report in office note.  DG Foot Complete Right  Result Date: 11/24/2020 CLINICAL DATA:  Diabetic foot infection. EXAM: RIGHT FOOT COMPLETE - 3+ VIEW COMPARISON:  02/24/2020 FINDINGS: There is nonspecific soft tissue swelling about the foot without evidence for an acute displaced fracture or dislocation. There is no radiopaque foreign body. There is no radiographic evidence for osteomyelitis. No definite soft tissue ulcer. IMPRESSION: Nonspecific soft tissue swelling about the foot without evidence for an acute displaced fracture or dislocation. Electronically Signed   By: Constance Holster M.D.   On: 11/24/2020 19:15   Assessment/Plan Lindsay Harmon is a 65 year old female with medical history significant for Chronic diastolic heart failure, CAD, CKD 4, COPD on home O2 at 4 L, HTN, DM2 with polyneuropathy and PVD, hospitalized for right lower extremity cellulitis from 1/28-2/1 who was sent in by her podiatrist for IV antibiotics and vascular due to recurrent cellulitis, this time of her left lower extremity.    1.  Recurrent Cellulitis: Patient endorses a history of recurrent cellulitis to the bilateral lower extremity.  This admission, her left lower extremity has become progressively more erythematous and painful.  In the setting of recurrent cellulitis with multiple risk factors for atherosclerotic disease recommend the patient undergo a left lower extremity angiogram with possible intervention and attempt to assess her anatomy and contributing degree of peripheral artery disease.  If appropriate an attempt revascularize leg can be made at that time.  Patient will most likely need an angio to the right lower extremity however this can  be planned at a later time.  Procedure, risks and benefits explained to the patient.  All questions were  answered.  Patient was to proceed.    2.  Type 2 diabetes: On appropriate medications Encouraged good control as its slows the progression of atherosclerotic disease  3.  Hyperlipidemia: Aspirin and statin for medical management Encouraged good control as its slows the progression of atherosclerotic disease  Discussed with Dr. Mayme Genta, PA-C  12/19/2020 4:32 PM  This note was created with Dragon medical transcription system.  Any error is purely unintentional

## 2020-12-19 NOTE — Progress Notes (Signed)
   12/19/20 0228  Provider Notification  Provider Name/Title MD Damita Dunnings  Date Provider Notified 12/19/20  Time Provider Notified [phone]  Notification Type Page (secure chat & Page)  Notification Reason Change in status;Critical result  Test performed and critical result CBG test - Level was 10. Repeated due to critical result -new level 11  Date Critical Result Received 12/19/20  Time Critical Result Received 0226  Provider response Other (Comment) (Responded to bedside @ 02:40 -verbal order of D10 _0 /hr)  Date of Provider Response 12/19/20  Time of Provider Response 0240  Rapid Response Notification  Name of Rapid Response RN Notified Isabella RN,= Charge Nurse  Date Rapid Response Notified 12/19/20  Time Rapid Response Notified 0225  Note  Observations patient noted to be alert but non-responsive only stating "huh" and looking at the directed person when called by first name. Seen twitching with gaze intermittently fixed forward./ Unable to answer questions or respond appropriately. Breathing independently. CBG checked and result was 10./rechecked - CBG was 11. Rapid called. ICU charge/ RT/ & AC first seen to room in response      Time flow from low blood sugar/ unresponsive to coherent and normal blood sugar. 1) From 02:28- 02:29 (Blood sugar 10 & then 11 @ second check) Rapid Response Called. See note above for patient evaluation a this time.      2) @ 02:30 first dose of glucagon administered IM / glucagon oral administered.  3) MD Damita Dunnings Paged and secure chat messagesd @ 02:30 RAPID RESPONSE called x2. Current vitals at this time HR- 149    RR- 32      O2- 84% on 4L (increased to [facility] at this time)  4) Amp of D5 obtained - attempted to administer @ 02:36- unable to give due to IV access loss _1 :38. CBG 27 @ 02:39 5) _2 :40 MD Damita Dunnings at bedside- verbal order to [place] @ 217m/hr and recheck CBG Qhr and relay to MD for re-assessment.  6) _3 :41 A&Ox3 ... BP- 131/78 ,  HR-102  , O2- 89-90% on 5LNC 7) _4 :45 CBG 99 8) _5 :15 CBG 172  Orders to continue D10NS. Stopped at 0400 CBG ~425

## 2020-12-19 NOTE — Progress Notes (Signed)
Night floor coverage  Rapid response was called for altered mental status and hypoglycemia with blood sugar of 10.  By my arrival patient was already starting to respond and was able to be aroused and answer questions appropriately though but readily falls back asleep.  She was administered glucose gel and started on D10 with improvement in blood sugars to over 100.  Of note patient was administered IV insulin for blood sugar of 448 early in the night  Hypoglycemia, brittle diabetes Acute metabolic encephalopathy/unresponsiveness secondary to hypoglycemia -Continue to monitor blood sugars -Continue D10 and wean as appropriate -Monitor mental status

## 2020-12-19 NOTE — H&P (View-Only) (Signed)
[facility] Vascular Consult Note  MRN : [id number]  Lindsay Harmon is a 65 y.o. (11/03/1955) female who presents with chief complaint of  Chief Complaint  Patient presents with  . Recurrent Skin Infections  . Leg Problem   History of Present Illness:  Lindsay Harmon is a 65 year old female with medical history significant for Chronic diastolic heart failure, CAD, CKD 4, COPD on home O2 at 4 L, HTN, DM2 with polyneuropathy and PVD, hospitalized for right lower extremity cellulitis from 1/28-2/1 who was sent in by her podiatrist for IV antibiotics and vascular due to recurrent cellulitis, this time of her left lower extremity.    Patient stated she noticed redness and swelling in the leg starting 3 to 4 days ago.  Cellulitis of the right lower extremity has improved.  She denies fever or chills, cough or shortness of breath beyond her baseline.  Denies chest pain.  Denies nausea vomiting, abdominal pain or diarrhea.  Vascular surgery was consulted by Dr. Duncan for possible endovascular intervention.  Current Facility-Administered Medications  Medication Dose Route Frequency Provider Last Rate Last Admin  . acetaminophen (TYLENOL) tablet 650 mg  650 mg Oral Q6H PRN Duncan, Hazel V, MD       Or  . acetaminophen (TYLENOL) suppository 650 mg  650 mg Rectal Q6H PRN Duncan, Hazel V, MD      . enoxaparin (LOVENOX) injection 30 mg  30 mg Subcutaneous Q24H Duncan, Hazel V, MD   30 mg at 12/19/20 0016  . HYDROcodone-acetaminophen (NORCO/VICODIN) 5-325 MG per tablet 1-2 tablet  1-2 tablet Oral Q4H PRN Duncan, Hazel V, MD   2 tablet at 12/19/20 1244  . insulin aspart (novoLOG) injection 0-15 Units  0-15 Units Subcutaneous TID WC Duncan, Hazel V, MD   3 Units at 12/19/20 1244  . insulin aspart (novoLOG) injection 0-5 Units  0-5 Units Subcutaneous QHS Duncan, Hazel V, MD      . ipratropium-albuterol (DUONEB) 0.5-2.5 (3) MG/3ML nebulizer solution 3 mL  3 mL Nebulization Q6H PRN  Duncan, Hazel V, MD      . morphine 2 MG/ML injection 2 mg  2 mg Intravenous Q2H PRN Duncan, Hazel V, MD      . ondansetron (ZOFRAN) tablet 4 mg  4 mg Oral Q6H PRN Duncan, Hazel V, MD       Or  . ondansetron (ZOFRAN) injection 4 mg  4 mg Intravenous Q6H PRN Duncan, Hazel V, MD      . [START ON 12/15/2020] vancomycin (VANCOCIN) IVPB 1000 mg/200 mL premix  1,000 mg Intravenous Q48H Chappell, Alex B, RPH       Past Medical History:  Diagnosis Date  . Allergy   . Aortic atherosclerosis (HCC)   . Arthritis   . CKD (chronic kidney disease), stage IV (HCC)   . COPD (chronic obstructive pulmonary disease) (HCC)    a. 11/2014 PFT's: FEV1 0.87 (38%), FVC 1.41 (48%), FEF 25-75 0.41 (19%). Unable to perform DLCO; b. 12/2017 Simple spirometry ration 67%. FEV1 1.21 L+53% predicted. FVC 1.79L 61% predicted.  . Coronary artery calcification seen on CT scan    a. 03/2017 CT Chest.  . Depression   . Diabetic neuropathy (HCC)   . Erythrocytosis 12/13/2019  . Esophageal stricture   . Familial hematuria   . GERD (gastroesophageal reflux disease)   . Hyperlipidemia   . Hypertension   . IBS (irritable bowel syndrome)   . Nephrolithiasis   . NIDDM (non-insulin dependent diabetes   mellitus)   . Obesity, unspecified   . Pulmonary hypertension ([place])    a. WHO Group 3; b. 10/2016 Echo: EF 55-60%, no rwma, Gr1 DD, mild to mod AS, mean grad 4mHg, mildly dil RV w/ mildly reduced RV fxn, mildly dil RA. PASP 1166mg; c. 12/2017 RHC: RA 11, RV 92/12, PA 92/33(57), PCWP 15, Fick CO/CI 5.9/3.3. PVR 7.2 WU. Ao 93%, PA 62/64%.  . Marland KitchenVD (peripheral vascular disease) ([place]   Past Surgical History:  Procedure Laterality Date  . ABDOMINAL HYSTERECTOMY    . CARDIAC CATHETERIZATION    . CARPAL TUNNEL RELEASE    . CESAREAN SECTION    . ERCP N/A 03/17/2017   Procedure: ENDOSCOPIC RETROGRADE CHOLANGIOPANCREATOGRAPHY (ERCP);  Surgeon: WoLucilla LameMD;  [facility];  Service: Endoscopy;  Laterality: N/A;  . EUS N/A  03/13/2017   Procedure: FULL UPPER ENDOSCOPIC ULTRASOUND (EUS) RADIAL;  Surgeon: Burbridge, ReMurray HodgkinsMD;  [facility];  Service: Endoscopy;  Laterality: N/A;  . RIGHT HEART CATH N/A 01/22/2018   Procedure: RIGHT HEART CATH;  Surgeon: BeJolaine ArtistMD;  [facility];  Service: Cardiovascular;  Laterality: N/A;  . RIGHT HEART CATHETERIZATION N/A 12/05/2014   Procedure: RIGHT HEART CATH;  Surgeon: HeSinclair GroomsMD;  [facility];  Service: Cardiovascular;  Laterality: N/A;   Social History Social History   Tobacco Use  . Smoking status: Former Smoker    Packs/day: 1.50    Years: 33.00    Pack years: 49.50    Types: Cigarettes    Quit date: 07/28/2005    Years since quitting: 15.4  . Smokeless tobacco: Never Used  Vaping Use  . Vaping Use: Never used  Substance Use Topics  . Alcohol use: No    Alcohol/week: 0.0 standard drinks    Comment: heavy in the past  . Drug use: No   Family History Family History  Problem Relation Age of Onset  . Diabetes Mother   . Cancer Mother        ovarian,melanoma  . Allergies Father   . Cancer Father        lung  . Cancer Maternal Grandmother        uterine  . Emphysema Paternal Grandfather   . Allergies Sister   . Breast cancer Neg Hx   Denies family history of peripheral artery disease, venous disease or renal disease.  Allergies  Allergen Reactions  . Sertraline Hcl Other (See Comments)    Altered Mental Status   REVIEW OF SYSTEMS (Negative unless checked)  Constitutional: _0 Weight loss  _1 Fever  _2 Chills Cardiac: _3 Chest pain   _4 Chest pressure   _5 Palpitations   _6 Shortness of breath when laying flat   _7 Shortness of breath at rest   _8 Shortness of breath with exertion. Vascular:  _9 Pain in legs with walking   _10 Pain in legs at rest   _11 Pain in legs when laying flat   _12 Claudication   _13 Pain in feet when walking  _14 Pain in feet at rest  _15 Pain in feet when laying flat   _16 History of DVT    _17 Phlebitis   _18 Swelling in legs   _19 Varicose veins   _20 Non-healing ulcers Pulmonary:   _21 Uses home oxygen   _22 Productive cough   _23 Hemoptysis   _24 Wheeze  _25 COPD   _26 Asthma Neurologic:  _27 Dizziness  _28 Blackouts   _29 Seizures   _30 History of stroke   _31 History of TIA  _32 Aphasia   _33 Temporary blindness   _34 Dysphagia   _35 Weakness or  numbness in arms   []Weakness or numbness in legs Musculoskeletal:  []Arthritis   []Joint swelling   []Joint pain   []Low back pain Hematologic:  []Easy bruising  []Easy bleeding   []Hypercoagulable state   []Anemic  []Hepatitis Gastrointestinal:  []Blood in stool   []Vomiting blood  []Gastroesophageal reflux/heartburn   []Difficulty swallowing. Genitourinary:  []Chronic kidney disease   []Difficult urination  []Frequent urination  []Burning with urination   []Blood in urine Skin:  []Rashes   []Ulcers   []Wounds Psychological:  []History of anxiety   [] History of major depression.  Physical Examination  Vitals:   12/19/20 0404 12/19/20 0731 12/19/20 1124 12/19/20 1531  BP: (!) 100/47 (!) 94/55 101/63 (!) 109/47  Pulse: 85 71 73 73  Resp: 17 15 16 17  Temp: 98.6 F (37 C) (!) 97.4 F (36.3 C) 98.2 F (36.8 C) 99.4 F (37.4 C)  TempSrc: Oral Oral Oral Oral  SpO2: (!) 89% 96% 92% 97%  Weight:      Height:       Body mass index is 28.12 kg/m. Gen:  WD/WN, NAD Head: [place]/AT, No temporalis wasting. Prominent temp pulse not noted. Ear/Nose/Throat: Hearing grossly intact, nares w/o erythema or drainage, oropharynx w/o Erythema/Exudate Eyes: Sclera non-icteric, conjunctiva clear Neck: Trachea midline.  No JVD.  Pulmonary:  Good air movement, respirations not labored, equal bilaterally.  Cardiac: RRR, normal S1, S2. Vascular:  Vessel Right Left  Radial Palpable Palpable  Ulnar Palpable Palpable  Brachial Palpable Palpable  Carotid Palpable, without bruit Palpable, without bruit  Aorta Not palpable N/A  Femoral Palpable Palpable  Popliteal Palpable Palpable   PT Non-Palpable Non-Palpable  DP Non-Palpable Non-Palpable   Left lower extremity: Thigh soft.  Calf soft.  Erythematous.  Soft.  Extremities warm distally toes however unable to palpate pedal pulses  Right lower extremity: Thigh soft.  Calf soft.  Erythematous.  Soft.  Extremities warm distally toes however unable to palpate pedal pulses  Gastrointestinal: soft, non-tender/non-distended. No guarding/reflex.  Musculoskeletal: M/S 5/5 throughout.  Extremities without ischemic changes.  No deformity or atrophy. No edema. Neurologic: Sensation grossly intact in extremities.  Symmetrical.  Speech is fluent. Motor exam as listed above. Psychiatric: Judgment intact, Mood & affect appropriate for pt's clinical situation. Dermatologic: No rashes or ulcers noted.  No cellulitis or open wounds. Lymph : No Cervical, Axillary, or Inguinal lymphadenopathy.  CBC Lab Results  Component Value Date   WBC 11.6 (H) 12/19/2020   HGB 13.1 12/19/2020   HCT 39.5 12/19/2020   MCV 92.7 12/19/2020   PLT 191 12/19/2020   BMET    Component Value Date/Time   NA 135 12/19/2020 0604   K 3.1 (L) 12/19/2020 0604   CL 90 (L) 12/19/2020 0604   CO2 33 (H) 12/19/2020 0604   GLUCOSE 312 (H) 12/19/2020 0604   BUN 35 (H) 12/19/2020 0604   CREATININE 2.07 (H) 12/19/2020 0604   CALCIUM 8.8 (L) 12/19/2020 0604   GFRNONAA 26 (L) 12/19/2020 0604   GFRAA 23 (L) 04/18/2020 1219   Estimated Creatinine Clearance: 23.2 mL/min (A) (by C-G formula based on SCr of 2.07 mg/dL (H)).  COAG Lab Results  Component Value Date   INR 1.7 (H) 11/30/2020   INR 1.6 (H) 11/25/2020   INR 1.3 (H) 01/03/2020   Radiology US Venous Img Lower Unilateral Right  Result Date: 11/24/2020 CLINICAL DATA:  Erythema of the right lower extremity EXAM: RIGHT LOWER EXTREMITY VENOUS DOPPLER ULTRASOUND TECHNIQUE: Gray-scale sonography with compression, as   well as color and duplex ultrasound, were performed to evaluate the deep venous system(s) from  the level of the common femoral vein through the popliteal and proximal calf veins. COMPARISON:  None. FINDINGS: VENOUS Normal compressibility of the common femoral, superficial femoral, and popliteal veins, as well as the visualized calf veins. Visualized portions of profunda femoral vein and great saphenous vein unremarkable. No filling defects to suggest DVT on grayscale or color Doppler imaging. Doppler waveforms show normal direction of venous flow, normal respiratory plasticity and response to augmentation. Limited views of the contralateral common femoral vein are unremarkable. OTHER Lower extremity edema is noted. Limitations: none IMPRESSION: 1. No evidence for DVT. 2. There is nonspecific lower extremity edema. Electronically Signed   By: Christopher  Green M.D.   On: 11/24/2020 19:12   US ARTERIAL ABI (SCREENING LOWER EXTREMITY)  Result Date: 11/25/2020 CLINICAL DATA:  Peripheral artery disease EXAM: NONINVASIVE PHYSIOLOGIC VASCULAR STUDY OF BILATERAL LOWER EXTREMITIES TECHNIQUE: Evaluation of both lower extremities were performed at rest, including calculation of ankle-brachial indices with single level Doppler, pressure and pulse volume recording. COMPARISON:  None. FINDINGS: Right ABI:  0.5 Left ABI:  0.61 Right Lower Extremity:  Monophasic waveforms Left Lower Extremity:  Monophasic waveforms IMPRESSION: Decreased ABIs bilateral consistent with moderate arterial disease. Monophasic waveforms are noted at the ankles bilaterally. Electronically Signed   By: Mark  Lukens M.D.   On: 11/25/2020 02:22   DG Foot Complete Left  Result Date: 11/29/2020 CLINICAL DATA:  Foot cellulitis with ankle ulceration EXAM: LEFT FOOT - COMPLETE 3+ VIEW COMPARISON:  None. FINDINGS: No fracture or malalignment. Mild degenerative changes at the first MTP joint. No soft tissue emphysema. No bony destructive change or periostitis. IMPRESSION: Mild degenerative changes at the first MTP joint. No acute osseous  abnormality. Electronically Signed   By: Kim  Fujinaga M.D.   On: 12/15/2020 20:43   DG Foot Complete Right  Result Date: 11/27/2020 Please see detailed radiograph report in office note.  DG Foot Complete Right  Result Date: 11/24/2020 CLINICAL DATA:  Diabetic foot infection. EXAM: RIGHT FOOT COMPLETE - 3+ VIEW COMPARISON:  02/24/2020 FINDINGS: There is nonspecific soft tissue swelling about the foot without evidence for an acute displaced fracture or dislocation. There is no radiopaque foreign body. There is no radiographic evidence for osteomyelitis. No definite soft tissue ulcer. IMPRESSION: Nonspecific soft tissue swelling about the foot without evidence for an acute displaced fracture or dislocation. Electronically Signed   By: Christopher  Green M.D.   On: 11/24/2020 19:15   Assessment/Plan Lindsay Harmon is a 65 year old female with medical history significant for Chronic diastolic heart failure, CAD, CKD 4, COPD on home O2 at 4 L, HTN, DM2 with polyneuropathy and PVD, hospitalized for right lower extremity cellulitis from 1/28-2/1 who was sent in by her podiatrist for IV antibiotics and vascular due to recurrent cellulitis, this time of her left lower extremity.    1.  Recurrent Cellulitis: Patient endorses a history of recurrent cellulitis to the bilateral lower extremity.  This admission, her left lower extremity has become progressively more erythematous and painful.  In the setting of recurrent cellulitis with multiple risk factors for atherosclerotic disease recommend the patient undergo a left lower extremity angiogram with possible intervention and attempt to assess her anatomy and contributing degree of peripheral artery disease.  If appropriate an attempt revascularize leg can be made at that time.  Patient will most likely need an angio to the right lower extremity however this can   be planned at a later time.  Procedure, risks and benefits explained to the patient.  All questions were  answered.  Patient was to proceed.    2.  Type 2 diabetes: On appropriate medications Encouraged good control as its slows the progression of atherosclerotic disease  3.  Hyperlipidemia: Aspirin and statin for medical management Encouraged good control as its slows the progression of atherosclerotic disease  Discussed with Dr. Mayme Genta, PA-C  12/19/2020 4:32 PM  This note was created with Dragon medical transcription system.  Any error is purely unintentional

## 2020-12-19 NOTE — Consult Note (Signed)
ORTHOPAEDIC CONSULTATION  REQUESTING PHYSICIAN: Allie Bossier, MD  Chief Complaint: Cellulitis  HPI: Lindsay Harmon is a 65 y.o. female who complains of  Worsening cellulitis.  Has small ulcer to lateral left ankle.    Past Medical History:  Diagnosis Date  . Allergy   . Aortic atherosclerosis ([place])   . Arthritis   . CKD (chronic kidney disease), stage IV ([place])   . COPD (chronic obstructive pulmonary disease) ([place])    a. 11/2014 PFT's: FEV1 0.87 (38%), FVC 1.41 (48%), FEF 25-75 0.41 (19%). Unable to perform DLCO; b. 12/2017 Simple spirometry ration 67%. FEV1 1.21 L+53% predicted. FVC 1.79L 61% predicted.  . Coronary artery calcification seen on CT scan    a. 03/2017 CT Chest.  . Depression   . Diabetic neuropathy ([place])   . Erythrocytosis 12/13/2019  . Esophageal stricture   . Familial hematuria   . GERD (gastroesophageal reflux disease)   . Hyperlipidemia   . Hypertension   . IBS (irritable bowel syndrome)   . Nephrolithiasis   . NIDDM (non-insulin dependent diabetes mellitus)   . Obesity, unspecified   . Pulmonary hypertension ([place])    a. WHO Group 3; b. 10/2016 Echo: EF 55-60%, no rwma, Gr1 DD, mild to mod AS, mean grad 69mHg, mildly dil RV w/ mildly reduced RV fxn, mildly dil RA. PASP 1168mg; c. 12/2017 RHC: RA 11, RV 92/12, PA 92/33(57), PCWP 15, Fick CO/CI 5.9/3.3. PVR 7.2 WU. Ao 93%, PA 62/64%.  . Marland KitchenVD (peripheral vascular disease) ([facility] [place]   Past Surgical History:  Procedure Laterality Date  . ABDOMINAL HYSTERECTOMY    . CARDIAC CATHETERIZATION    . CARPAL TUNNEL RELEASE    . CESAREAN SECTION    . ERCP N/A 03/17/2017   Procedure: ENDOSCOPIC RETROGRADE CHOLANGIOPANCREATOGRAPHY (ERCP);  Surgeon: WoLucilla LameMD;  [facility];  Service: Endoscopy;  Laterality: N/A;  . EUS N/A 03/13/2017   Procedure: FULL UPPER ENDOSCOPIC ULTRASOUND (EUS) RADIAL;  Surgeon: Burbridge, ReMurray HodgkinsMD;  [facility];  Service: Endoscopy;  Laterality: N/A;  . RIGHT  HEART CATH N/A 01/22/2018   Procedure: RIGHT HEART CATH;  Surgeon: BeJolaine ArtistMD;  [facility];  Service: Cardiovascular;  Laterality: N/A;  . RIGHT HEART CATHETERIZATION N/A 12/05/2014   Procedure: RIGHT HEART CATH;  Surgeon: HeSinclair GroomsMD;  [facility];  Service: Cardiovascular;  Laterality: N/A;   Social History   Socioeconomic History  . Marital status: Legally Separated    Spouse name: Not on file  . Number of children: 1  . Years of education: Not on file  . Highest education level: Not on file  Occupational History  . 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 Smoking status: Former Smoker    Packs/day: 1.50    Years: 33.00    Pack years: 49.50    Types: Cigarettes    Quit date: 07/28/2005    Years since quitting: 15.4  . Smokeless tobacco: Never Used  Vaping Use  . Vaping Use: Never used  Substance and Sexual Activity  . Alcohol use: No    Alcohol/week: 0.0 standard drinks    Comment: heavy in the past  . Drug use: No  . Sexual activity: Never  Other Topics Concern  . Not on file  Social History Narrative   No living will   Son JoVonna Kotykthen mom) should make decisions for her if she  is unable.    Would accept resuscitation attempts but no prolonged ventilation   Not sure about tube feeds but probably wouldn't want them if cognitively unaware   Social Determinants of Health   Financial Resource Strain: Low Risk   . Difficulty of Paying Living Expenses: Not very hard  Food Insecurity: Not on file  Transportation Needs: Not on file  Physical Activity: Not on file  Stress: Not on file  Social Connections: Not on file   Family History  Problem Relation Age of Onset  . Diabetes Mother   . Cancer Mother        ovarian,melanoma  . Allergies Father   . Cancer Father        lung  . Cancer Maternal Grandmother        uterine  . Emphysema Paternal Grandfather   . Allergies  Sister   . Breast cancer Neg Hx    Allergies  Allergen Reactions  . Sertraline Hcl Other (See Comments)    Altered Mental Status   Prior to Admission medications   Medication Sig Start Date End Date Taking? Authorizing Provider  albuterol (PROVENTIL) (2.5 MG/3ML) 0.083% nebulizer solution USE 1 VIAL (3ML) BY NEBULIZATION EVERY 6HOURS AS NEEDED FOR WHEEZING OR SHORTNESS OF BREATH Patient taking differently: Take 2.5 mg by nebulization every 6 (six) hours as needed for wheezing or shortness of breath. 11/10/19  Yes Martyn Ehrich, NP  ALPRAZolam Duanne Moron) 0.25 MG tablet Take 1 tablet (0.25 mg total) by mouth daily as needed for anxiety. 07/24/18  Yes Venia Carbon, MD  aspirin EC 81 MG tablet Take 1 tablet (81 mg total) by mouth daily. 03/23/19  Yes Wellington Hampshire, MD  calcitRIOL (ROCALTROL) 0.5 MCG capsule Take 0.5 mcg by mouth daily.  02/03/17  Yes [provider]  cholecalciferol (VITAMIN D) 1000 units tablet Take 1,000 Units by mouth at bedtime.    Yes [provider]  ferrous sulfate 325 (65 FE) MG tablet Take 325 mg by mouth daily with breakfast. 08/03/20 08/03/21 Yes [provider]  gabapentin (NEURONTIN) 300 MG capsule TAKE 1 CAPSULE BY MOUTH IN THE MORNING AND 2 CAPSULES BY MOUTH AT BEDTIME Patient taking differently: Take 300-600 mg by mouth See admin instructions. Take 362m by mouth in the morning and 6082mat bedtime. 12/08/20  Yes LeViviana Simpler, MD  insulin glargine (LANTUS SOLOSTAR) 100 UNIT/ML Solostar Pen Inject 15 Units into the skin daily. Patient taking differently: Inject 13 Units into the skin daily. 08/02/20  Yes LeVenia CarbonMD  loratadine (CLARITIN) 10 MG tablet Take 10 mg by mouth 2 (two) times daily.   Yes Tisovec, RiFransico HimMD  lovastatin (MEVACOR) 40 MG tablet Take 2 tablets (80 mg total) by mouth at bedtime. 12/08/20  Yes LeVenia CarbonMD  methadone (DOLOPHINE) 10 MG tablet TAKE 2 TABLETS BY MOUTH EVERY 6 HOURS  ASNEEDED Patient taking differently: Take 20 mg by mouth every 6 (six) hours as needed for moderate pain. 11/01/20  Yes LeVenia CarbonMD  metolazone (ZAROXOLYN) 2.5 MG tablet TAKE 1 TABLET BY MOUTH ONCE A WEEK EVERYTUESDAY Patient taking differently: Take 2.5 mg by mouth every Friday. 12/07/20  Yes Bensimhon, DaShaune PascalMD  metoprolol tartrate (LOPRESSOR) 25 MG tablet Take 1/2 tablet by mouth twice a day Patient taking differently: Take 12.5 mg by mouth 2 (two) times daily. 05/09/20  Yes LeVenia CarbonMD  mometasone (ASMANEX, 120 METERED DOSES,) 220 MCG/INH inhaler Inhale 2 puffs  into the lungs 2 (two) times daily. 12/06/20  Yes Olalere, Adewale A, MD  nitroGLYCERIN (NITROSTAT) 0.4 MG SL tablet Place 1 tablet (0.4 mg total) under the tongue every 5 (five) minutes as needed for chest pain. 06/06/20  Yes Wellington Hampshire, MD  pantoprazole (PROTONIX) 40 MG tablet Take 1 tablet by mouth twice a day Patient taking differently: Take 40 mg by mouth 2 (two) times daily. 06/22/20  Yes Venia Carbon, MD  potassium chloride SA (KLOR-CON) 20 MEQ tablet Take 2 tablets (40 mEq total) by mouth once a week. Every Tuesday Patient taking differently: Take 40 mEq by mouth every Friday. 12/05/20  Yes Bensimhon, Shaune Pascal, MD  PROAIR HFA 108 [id number] Base) MCG/ACT inhaler USE 2 PUFFS EVERY 6 HOURS AS NEEDED FOR WHEEZING OR SHORTNESS OF BREATH Patient taking differently: Inhale 2 puffs into the lungs every 6 (six) hours as needed for wheezing or shortness of breath. 12/14/20  Yes Olalere, Adewale A, MD  Tiotropium Bromide-Olodaterol (STIOLTO RESPIMAT) 2.5-2.5 MCG/ACT AERS INHALE 2 PUFFS BY MOUTH INTO THE LUNGS DAILY Patient taking differently: Inhale 2 puffs into the lungs daily. 12/14/20  Yes Olalere, Adewale A, MD  torsemide (DEMADEX) 20 MG tablet Take 3 tablets (60 mg total) by mouth 2 (two) times daily. Hold afternoon dose if your weight is under 150# at home Patient taking differently: Take 60 mg by mouth See admin  instructions. Take 240m in the morning.  Take 638min the afternoon is weight is over 150 pounds. 10/10/20  Yes LeVenia CarbonMD  Treprostinil (TYVASO) 0.6 MG/ML SOLN Inhale 18 mcg into the lungs 4 (four) times daily.   Yes [provider]  glucose blood (ONE TOUCH ULTRA TEST) test strip USE 1 STRIP TO TEST BLOOD SUGAR TWICE DAILY DUE TO LOW BLOOD SUGAR. TAKES LANTUS INSULIN Dx Code E11.51 08/10/20   LeViviana Simpler, MD  Insulin Pen Needle (B-D UF III MINI PEN NEEDLES) 31G X 5 MM MISC USE 1 PEN NEEDLE TO INJECT INSULIN 01/11/20   LeVenia CarbonMD  Respiratory Therapy Supplies (FLUTTER) DEVI Use as directed 12/10/19   GoTyler PitaMD   DG Foot Complete Left  Result Date: 12/12/2020 CLINICAL DATA:  Foot cellulitis with ankle ulceration EXAM: LEFT FOOT - COMPLETE 3+ VIEW COMPARISON:  None. FINDINGS: No fracture or malalignment. Mild degenerative changes at the first MTP joint. No soft tissue emphysema. No bony destructive change or periostitis. IMPRESSION: Mild degenerative changes at the first MTP joint. No acute osseous abnormality. Electronically Signed   By: KiDonavan Foil.D.   On: 12/10/2020 20:43    Positive ROS: All other systems have been reviewed and were otherwise negative with the exception of those mentioned in the HPI and as above.  12 point ROS was performed.  Physical Exam: General: Alert and oriented.  No apparent distress.  Vascular:  Left foot:Dorsalis Pedis:  absent Posterior Tibial:  absent  Right foot: Dorsalis Pedis:  absent Posterior Tibial:  absent  Neuro:intact gross sensation  Derm:diffuse erythema to entire left lower extremity.  Small hyperkeratotic lesion right 5th.  40m41muperficial ulcer left fibula.          Ortho/MS: Edema left lower leg.   Assessment: PVD with cellulitis  Left ankle superficial ulcer.  Plan: Vascular has been consulted and planning procedure tomorrow.  This looks to be a mostly vascular issue.  Ankle  ulcer is superficial and non infected.  No debridment needed at this time.  Pt  to f/u with outpt podiatry upon d/c.  Will follow at distance unless needed.     Elesa Hacker, DPM Cell [phone]   12/19/2020 1:50 PM

## 2020-12-19 NOTE — ED Notes (Signed)
Patient transported up to floor by Miquel Dunn, RN

## 2020-12-19 NOTE — Progress Notes (Addendum)
PROGRESS NOTE    Lindsay Harmon  WPV:[id number] DOB: 06/20/1956 DOA: 12/14/2020 PCP: Venia Carbon, MD     Brief Narrative:  Lindsay Harmon is a 65 y.o.WF PMHx Chronic diastolic heart failure, CAD, CKD Stage 4, COPD on home O2 at 4 L, HTN, DM2 uncontrolled/ DM Polyneuropathy and PVD, hospitalized for right lower extremity cellulitis from 1/28-2/1   who was sent in by her podiatrist for IV antibiotics and vascular due to recurrent cellulitis, this time of her left lower extremity.  Patient stated she noticed redness and swelling in the leg starting 3 to 4 days ago.  Cellulitis of the right lower extremity has improved.  She denies fever or chills, cough or shortness of breath beyond her baseline.  Denies chest pain.  Denies nausea vomiting, abdominal pain or diarrhea. ED Course: On arrival, afebrile, BP 124/113, pulse 72, respirations 17 with O2 sat 95% on room air.  WBC 9200 with normal lactic acid of 1.7.  CBC WNL.  CMP significant for creatinine of 2.02 which is at baseline for her CKD 4.  Blood sugar noted to be low at 53. EKG not done in the ER Imaging: Left foot x-ray:Mild degenerative changes at the first MTP joint. No acute osseous Abnormality  Patient started on Zosyn and vancomycin.  Hospitalist consulted for admission.   Subjective: A/O x4, patient's daughter states that while in the ED mother ran out of oxygen and the staff did not notice, in addition mother's CBG= 10, and mother became unresponsive.  Extremely upset or incident.   Assessment & Plan: Covid vaccination; vaccinated 3/3   Principal Problem:   Cellulitis of left lower leg Active Problems:   Type 2 diabetes mellitus with polyneuropathy, controlled ([place])   Essential hypertension, benign   CAD (coronary artery disease)   PVD (peripheral vascular disease) (HCC)   Chronic renal disease, stage IV (HCC)   Chronic diastolic heart failure (HCC)   Chronic respiratory failure with hypoxia (HCC)   COPD (chronic  obstructive pulmonary disease) (HCC)   Recurrent cellulitis   Cellulitis   Hypoglycemia associated with diabetes ([place])   Cellulitis of left leg   Uncontrolled type 2 diabetes mellitus with hyperglycemia (HCC)   Diabetic polyneuropathy (HCC)   Chronic diastolic CHF (congestive heart failure) (HCC)   COPD (chronic obstructive pulmonary disease) with emphysema (HCC)   Chronic pain syndrome   Cellulitis of left lower leg recurrent -Per patient has had several recent months of inpatient and outpatient courses of antibiotics for bilateral legs with cellulitis.  States would resolve be able to be discharged and then would recur in the opposite leg. -recently hospitalized from 1/28-2/1 with RLE cellulitis now presenting with pain redness and swelling LLE. -    PVD -Foot x-ray showing no evidence of osteomyelitis -No sepsis criteria -IV Zosyn and vancomycin -Keep legs elevated -Podiatry and vascular consults -Per vascular surgery note following is planned for 2/23; left lower extremity angiogram with possible intervention and attempt to assess her anatomy and contributing degree of peripheral artery disease.  If appropriate an attempt revascularize leg can be made at that time.  Patient will most likely need an angio to the right lower extremity however this can be planned at a later time.   Derm:diffuse erythema to entire left lower extremity.  Small hyperkeratotic lesion right 5th.  54m superficial ulcer left fibula.         DM type II uncontrolled with hyperglycemia/DM polyneuropathy -1/29 hemoglobin A1c= 7.4 -Hemoglobin A1c pending -Lipid  panel pending -Moderate SSI -Gabapentin 300 mg TID  Chronic diastolic CHF -Strict in and out -Daily weight -Cardiac telemetry --Patient relatively hypotensive hold all BP medication -Normal saline 24m/hr   Essential HTN -See CHF  CAD -Currently no chest pain -ASA 81 mg daily -Pravastatin 80 mg daily    Chronic renal  disease, stage IV (HCC) -Renal function at baseline -Continue vitamin D, Rocaltrol  Chronic respiratory failure with hypoxia/COPD -  Albuterol PRN -DuoNeb QID PRN -Claritin 10 mg BID -Tyvaso QID -Continuous pulse ox -Titrate O2 to maintain SPO2> 93%  Chronic pain syndrome/Anxiety -Vicodin PRN -Methadone 20 mg QID PRN -Morphine IV PRN -Xanax 0.25 mg PRN  Hypokalemia -Potassium goal> 4 -K-Dur 55m  Goals of care -2/22 daughter extremely upset that overnight in ED her mother was left without O2 and her CBG went down to 10.  Mother became unresponsive, was not detected by staff per daughter.  Has requested a formal inquiry into what exactly went wrong that allowed the situation to occur.   DVT prophylaxis: Lovenox Code Status: Full Family Communication: 2/22 daughter at bedside for discussion of plan of care answered all questions Status is: Inpatient    Dispo: The patient is from: Home              Anticipated d/c is to: Home              Anticipated d/c date is: Per surgery              Patient currently unstable      Consultants:  Vascular surgery Podiatry   Procedures/Significant Events:    I have personally reviewed and interpreted all radiology studies and my findings are as above.  VENTILATOR SETTINGS: Nasal cannula 2/22 Flow 4 L/min SPO2 97%   Cultures   Antimicrobials: Anti-infectives (From admission, onward)   Start     Ordered Stop   11/28/2020 2000  vancomycin (VANCOCIN) IVPB 1000 mg/200 mL premix        12/09/2020 2132     12/17/2020 2200  vancomycin (VANCOREADY) IVPB 500 mg/100 mL        12/03/2020 2131 12/19/20 0111   12/01/2020 2030  vancomycin (VANCOCIN) IVPB 1000 mg/200 mL premix        12/04/2020 2020 12/04/2020 2211   12/02/2020 2015  piperacillin-tazobactam (ZOSYN) IVPB 3.375 g        12/07/2020 2011 12/05/2020 2141       Devices    LINES / TUBES:      Continuous Infusions: . sodium chloride    . [START ON 12/17/2020] vancomycin        Objective: Vitals:   12/19/20 0404 12/19/20 0731 12/19/20 1124 12/19/20 1531  BP: (!) 100/47 (!) 94/55 101/63 (!) 109/47  Pulse: 85 71 73 73  Resp: _0 Temp: 98.6 F (37 C) (!) 97.4 F (36.3 C) 98.2 F (36.8 C) 99.4 F (37.4 C)  TempSrc: Oral Oral Oral Oral  SpO2: (!) 89% 96% 92% 97%  Weight:      Height:        Intake/Output Summary (Last 24 hours) at 12/19/2020 1800 Last data filed at 12/19/2020 0111 Gross per 24 hour  Intake 350 ml  Output --  Net 350 ml   Filed Weights   12/16/2020 1602  Weight: 67.5 kg    Examination:  General: A/O x4, positive chronic respiratory distress Eyes: negative scleral hemorrhage, negative anisocoria, negative icterus ENT: Negative Runny nose, negative gingival bleeding, Neck:  Negative scars, masses, torticollis, lymphadenopathy, JVD Lungs: decreased breath sounds bilaterally without wheezes or crackles, positive 3/5 systolic murmur Cardiovascular: Regular rate and rhythm without  gallop or rub normal S1 and S2 Abdomen: negative abdominal pain, nondistended, positive soft, bowel sounds, no rebound, no ascites, no appreciable mass Extremities: bilateral lower extremity erythematous warm to touch RIGHT >>> LEFT decreased to absent DP TP pulse.  See photos above Psychiatric:  Negative depression, negative anxiety, negative fatigue, negative mania  Central nervous system:  Cranial nerves II through XII intact, tongue/uvula midline, all extremities muscle strength 5/5, sensation intact throughout, negative dysarthria, negative expressive aphasia, negative receptive aphasia.  .     Data Reviewed: Care during the described time interval was provided by me .  I have reviewed this patient's available data, including medical history, events of note, physical examination, and all test results as part of my evaluation.  CBC: Recent Labs  Lab 11/28/2020 1604 12/19/20 0017 12/19/20 0604  WBC 9.2 7.7 11.6*  NEUTROABS 7.3  --   --    HGB 14.2 12.9 13.1  HCT 44.0 40.7 39.5  MCV 93.0 93.6 92.7  PLT 249 202 683   Basic Metabolic Panel: Recent Labs  Lab 12/11/2020 1604 12/19/20 0017 12/19/20 0604  NA 137  --  135  K 3.5  --  3.1*  CL 90*  --  90*  CO2 35*  --  33*  GLUCOSE 53*  --  312*  BUN 38*  --  35*  CREATININE 2.02* 1.97* 2.07*  CALCIUM 9.5  --  8.8*   GFR: Estimated Creatinine Clearance: 23.2 mL/min (A) (by C-G formula based on SCr of 2.07 mg/dL (H)). Liver Function Tests: Recent Labs  Lab 12/09/2020 1604  AST 33  ALT 19  ALKPHOS 52  BILITOT 1.7*  PROT 6.7  ALBUMIN 3.3*   No results for input(s): LIPASE, AMYLASE in the last 168 hours. No results for input(s): AMMONIA in the last 168 hours. Coagulation Profile: Recent Labs  Lab 12/22/2020 1604  INR 1.7*   Cardiac Enzymes: No results for input(s): CKTOTAL, CKMB, CKMBINDEX, TROPONINI in the last 168 hours. BNP (last 3 results) No results for input(s): PROBNP in the last 8760 hours. HbA1C: No results for input(s): HGBA1C in the last 72 hours. CBG: Recent Labs  Lab 12/19/20 0513 12/19/20 0729 12/19/20 0953 12/19/20 1148 12/19/20 1606  GLUCAP 435* 259* 193* 182* 193*   Lipid Profile: No results for input(s): CHOL, HDL, LDLCALC, TRIG, CHOLHDL, LDLDIRECT in the last 72 hours. Thyroid Function Tests: No results for input(s): TSH, T4TOTAL, FREET4, T3FREE, THYROIDAB in the last 72 hours. Anemia Panel: No results for input(s): VITAMINB12, FOLATE, FERRITIN, TIBC, IRON, RETICCTPCT in the last 72 hours. Sepsis Labs: Recent Labs  Lab 12/17/2020 2051 12/19/20 1022 12/19/20 1230 12/19/20 1540  LATICACIDVEN 1.9 1.3 1.5 1.7    Recent Results (from the past 240 hour(s))  Culture, blood (Routine x 2)     Status: None (Preliminary result)   Collection Time: 12/10/2020  4:10 PM   Specimen: BLOOD  Result Value Ref Range Status   Specimen Description BLOOD LEFT ANTECUBITAL  Final   Special Requests   Final    BOTTLES DRAWN AEROBIC AND ANAEROBIC  Blood Culture adequate volume   Culture   Final    NO GROWTH < 24 HOURS Performed at [facility], [address] [place], [place] [address]    Report Status PENDING  Incomplete  SARS CORONAVIRUS 2 (TAT 6-24 HRS) Nasopharyngeal Nasopharyngeal  Swab     Status: None   Collection Time: 12/01/2020  8:51 PM   Specimen: Nasopharyngeal Swab  Result Value Ref Range Status   SARS Coronavirus 2 NEGATIVE NEGATIVE Final    Comment: (NOTE) SARS-CoV-2 target nucleic acids are NOT DETECTED.  The SARS-CoV-2 RNA is generally detectable in upper and lower respiratory specimens during the acute phase of infection. Negative results do not preclude SARS-CoV-2 infection, do not rule out co-infections with other pathogens, and should not be used as the sole basis for treatment or other patient management decisions. Negative results must be combined with clinical observations, patient history, and epidemiological information. The expected result is Negative.  Fact Sheet for Patients: SugarRoll.be  Fact Sheet for Healthcare Providers: https://www.[place] [place]-mathews.com/  This test is not yet approved or cleared by the [place] FDA and  has been authorized for detection and/or diagnosis of SARS-CoV-2 by FDA under an Emergency Use Authorization (EUA). This EUA will remain  in effect (meaning this test can be used) for the duration of the COVID-19 declaration under Se ction 564(b)(1) of the Act, 21 U.S.C. section 360bbb-3(b)(1), unless the authorization is terminated or revoked sooner.  Performed at [facility], [place] [address], [place], [place] [address]   Culture, blood (Routine x 2)     Status: None (Preliminary result)   Collection Time: 12/22/2020  9:00 PM   Specimen: BLOOD  Result Value Ref Range Status   Specimen Description BLOOD BLOOD RIGHT FOREARM  Final   Special Requests   Final    BOTTLES DRAWN AEROBIC AND ANAEROBIC Blood Culture  adequate volume   Culture   Final    NO GROWTH < 12 HOURS Performed at [facility], [address] [place], [place] [address]    Report Status PENDING  Incomplete         Radiology Studies: DG Foot Complete Left  Result Date: 12/06/2020 CLINICAL DATA:  Foot cellulitis with ankle ulceration EXAM: LEFT FOOT - COMPLETE 3+ VIEW COMPARISON:  None. FINDINGS: No fracture or malalignment. Mild degenerative changes at the first MTP joint. No soft tissue emphysema. No bony destructive change or periostitis. IMPRESSION: Mild degenerative changes at the first MTP joint. No acute osseous abnormality. Electronically Signed   By: Donavan Foil M.D.   On: 12/17/2020 20:43        Scheduled Meds: . aspirin EC  81 mg Oral Daily  . calcitRIOL  0.5 mcg Oral Daily  . cholecalciferol  1,000 Units Oral QHS  . enoxaparin (LOVENOX) injection  30 mg Subcutaneous Q24H  . [START ON 12/02/2020] ferrous sulfate  325 mg Oral Q breakfast  . [START ON 12/05/2020] gabapentin  300 mg Oral Daily  . gabapentin  600 mg Oral QHS  . insulin aspart  0-15 Units Subcutaneous Q4H  . loratadine  10 mg Oral BID  . pantoprazole  40 mg Oral BID  . [START ON 11/28/2020] pravastatin  80 mg Oral q1800  . Treprostinil  18 mcg Inhalation QID   Continuous Infusions: . sodium chloride    . [START ON 12/24/2020] vancomycin       LOS: 1 day    Time spent:40 min    Jalyiah Shelley, Geraldo Docker, MD [facility]   If 7PM-7AM, please contact night-coverage 12/19/2020, 6:00 PM

## 2020-12-19 NOTE — Progress Notes (Signed)
Inpatient Diabetes Program Recommendations  AACE/ADA: New Consensus Statement on Inpatient Glycemic Control (2015)  Target Ranges:  Prepandial:   less than 140 mg/dL      Peak postprandial:   less than 180 mg/dL (1-2 hours)      Critically ill patients:  140 - 180 mg/dL   Results for Lindsay Harmon, Lindsay Harmon (MRN [id number]) as of 12/19/2020 07:16  Ref. Range 12/19/2020 00:13 12/19/2020 02:23 12/19/2020 02:25 12/19/2020 02:34 12/19/2020 02:45 12/19/2020 03:17 12/19/2020 04:00 12/19/2020 05:12 12/19/2020 05:13  Glucose-Capillary Latest Ref Range: 70 - 99 mg/dL 448 (H)  4 units NOVOLOG _0 :22am <10 (LL) 11 (LL)  2 mg Glucagon _1 :29am 27 (LL) 99 172 (H) 269 (H) 354 (H) 435 (H)   Results for Lindsay Harmon, Lindsay Harmon (MRN [id number]) as of 12/19/2020 09:18  Ref. Range 12/19/2020 07:29  Glucose-Capillary Latest Ref Range: 70 - 99 mg/dL 259 (H)   Results for Lindsay Harmon, Lindsay Harmon (MRN [id number]) as of 12/19/2020 07:16  Ref. Range 11/25/2020 02:43  Hemoglobin A1C Latest Ref Range: 4.8 - 5.6 % 7.4 (H)    Admit with: Cellulitis of left lower leg/ Hypoglycemia  History: DM, CHF, CKD4, COPD  Home DM Meds: Lantus 13 units Daily  Current Orders: Novolog Moderate Correction Scale/ SSI (0-15 units) TID AC + HS    Pt given 4 units Novolog _2 :22am for CBG of 448--CBG dropped to <10 mg/dl by 2:23am  CBG elevated since 4am now.   MD- Please consider:  1. Reduce Novolog SSi to the Very Sensitive scale (0-6 units) TID  2. Start Lantus 5 units Daily (50% home dose)    --Will follow patient during hospitalization--  Wyn Quaker RN, MSN, CDE Diabetes Coordinator Inpatient Glycemic Control Team Team Pager: [phone] (8a-5p)

## 2020-12-20 ENCOUNTER — Encounter: Admission: EM | Disposition: E | Payer: Self-pay | Source: Ambulatory Visit | Attending: Internal Medicine

## 2020-12-20 DIAGNOSIS — G894 Chronic pain syndrome: Secondary | ICD-10-CM | POA: Diagnosis not present

## 2020-12-20 DIAGNOSIS — L03116 Cellulitis of left lower limb: Secondary | ICD-10-CM

## 2020-12-20 DIAGNOSIS — I5032 Chronic diastolic (congestive) heart failure: Secondary | ICD-10-CM | POA: Diagnosis not present

## 2020-12-20 DIAGNOSIS — E11649 Type 2 diabetes mellitus with hypoglycemia without coma: Secondary | ICD-10-CM

## 2020-12-20 DIAGNOSIS — L03115 Cellulitis of right lower limb: Secondary | ICD-10-CM

## 2020-12-20 HISTORY — PX: LOWER EXTREMITY ANGIOGRAPHY: CATH118251

## 2020-12-20 LAB — CBC WITH DIFFERENTIAL/PLATELET
Abs Immature Granulocytes: 0.03 10*3/uL (ref 0.00–0.07)
Basophils Absolute: 0.1 10*3/uL (ref 0.0–0.1)
Basophils Relative: 1 %
Eosinophils Absolute: 0.1 10*3/uL (ref 0.0–0.5)
Eosinophils Relative: 2 %
HCT: 41.4 % (ref 36.0–46.0)
Hemoglobin: 13.2 g/dL (ref 12.0–15.0)
Immature Granulocytes: 0 %
Lymphocytes Relative: 23 %
Lymphs Abs: 1.6 10*3/uL (ref 0.7–4.0)
MCH: 29.9 pg (ref 26.0–34.0)
MCHC: 31.9 g/dL (ref 30.0–36.0)
MCV: 93.7 fL (ref 80.0–100.0)
Monocytes Absolute: 0.6 10*3/uL (ref 0.1–1.0)
Monocytes Relative: 9 %
Neutro Abs: 4.4 10*3/uL (ref 1.7–7.7)
Neutrophils Relative %: 65 %
Platelets: 184 10*3/uL (ref 150–400)
RBC: 4.42 MIL/uL (ref 3.87–5.11)
RDW: 15.7 % — ABNORMAL HIGH (ref 11.5–15.5)
WBC: 6.8 10*3/uL (ref 4.0–10.5)
nRBC: 0 % (ref 0.0–0.2)

## 2020-12-20 LAB — BLOOD CULTURE ID PANEL (REFLEXED) - BCID2

## 2020-12-20 LAB — LIPID PANEL
Cholesterol: 87 mg/dL (ref 0–200)
HDL: 38 mg/dL — ABNORMAL LOW (ref >40)
LDL Cholesterol: 35 mg/dL (ref 0–99)
Total CHOL/HDL Ratio: 2.3 RATIO
Triglycerides: 71 mg/dL (ref <150)
VLDL: 14 mg/dL (ref 0–40)

## 2020-12-20 LAB — GLUCOSE, CAPILLARY
Glucose-Capillary: 112 mg/dL — ABNORMAL HIGH (ref 70–99)
Glucose-Capillary: 125 mg/dL — ABNORMAL HIGH (ref 70–99)
Glucose-Capillary: 279 mg/dL — ABNORMAL HIGH (ref 70–99)
Glucose-Capillary: 362 mg/dL — ABNORMAL HIGH (ref 70–99)
Glucose-Capillary: 57 mg/dL — ABNORMAL LOW (ref 70–99)
Glucose-Capillary: 60 mg/dL — ABNORMAL LOW (ref 70–99)
Glucose-Capillary: 71 mg/dL (ref 70–99)
Glucose-Capillary: 81 mg/dL (ref 70–99)
Glucose-Capillary: 82 mg/dL (ref 70–99)
Glucose-Capillary: 96 mg/dL (ref 70–99)

## 2020-12-20 LAB — COMPREHENSIVE METABOLIC PANEL
ALT: 15 U/L (ref 0–44)
AST: 21 U/L (ref 15–41)
Albumin: 2.6 g/dL — ABNORMAL LOW (ref 3.5–5.0)
Alkaline Phosphatase: 45 U/L (ref 38–126)
Anion gap: 9 (ref 5–15)
BUN: 36 mg/dL — ABNORMAL HIGH (ref 8–23)
CO2: 34 mmol/L — ABNORMAL HIGH (ref 22–32)
Calcium: 9.1 mg/dL (ref 8.9–10.3)
Chloride: 97 mmol/L — ABNORMAL LOW (ref 98–111)
Creatinine, Ser: 1.94 mg/dL — ABNORMAL HIGH (ref 0.44–1.00)
GFR, Estimated: 28 mL/min — ABNORMAL LOW (ref >60)
Glucose, Bld: 69 mg/dL — ABNORMAL LOW (ref 70–99)
Potassium: 3.6 mmol/L (ref 3.5–5.1)
Sodium: 140 mmol/L (ref 135–145)
Total Bilirubin: 1 mg/dL (ref 0.3–1.2)
Total Protein: 5.6 g/dL — ABNORMAL LOW (ref 6.5–8.1)

## 2020-12-20 LAB — PHOSPHORUS: Phosphorus: 3.4 mg/dL (ref 2.5–4.6)

## 2020-12-20 LAB — MAGNESIUM: Magnesium: 1.6 mg/dL — ABNORMAL LOW (ref 1.7–2.4)

## 2020-12-20 LAB — HEMOGLOBIN A1C
Hgb A1c MFr Bld: 7.6 % — ABNORMAL HIGH (ref 4.8–5.6)
Mean Plasma Glucose: 171.42 mg/dL

## 2020-12-20 LAB — MRSA PCR SCREENING: MRSA by PCR: NEGATIVE

## 2020-12-20 SURGERY — LOWER EXTREMITY ANGIOGRAPHY
Anesthesia: Moderate Sedation | Laterality: Left

## 2020-12-20 MED ORDER — MIDAZOLAM HCL 2 MG/2ML IJ SOLN
INTRAMUSCULAR | Status: DC | PRN
  Administered 2020-12-20 (×2): 1 mg via INTRAVENOUS
  Administered 2020-12-20: 2 mg via INTRAVENOUS

## 2020-12-20 MED ORDER — BUDESONIDE 0.25 MG/2ML IN SUSP
0.2500 mg | Freq: Two times a day (BID) | RESPIRATORY_TRACT | Status: DC
  Administered 2020-12-20 – 2020-12-22 (×5): 0.25 mg via RESPIRATORY_TRACT
  Filled 2020-12-20 (×7): qty 2

## 2020-12-20 MED ORDER — MIDAZOLAM HCL 2 MG/ML PO SYRP: 8.0000 mg | ORAL_SOLUTION | Freq: Once | ORAL | Status: DC | PRN

## 2020-12-20 MED ORDER — METHYLPREDNISOLONE SODIUM SUCC 125 MG IJ SOLR: 125.0000 mg | Freq: Once | INTRAMUSCULAR | Status: DC | PRN

## 2020-12-20 MED ORDER — SODIUM CHLORIDE 0.9 % IV SOLN: INTRAVENOUS | Status: DC

## 2020-12-20 MED ORDER — HEPARIN SODIUM (PORCINE) 1000 UNIT/ML IJ SOLN
INTRAMUSCULAR | Status: AC
  Filled 2020-12-20: qty 1

## 2020-12-20 MED ORDER — DEXTROSE 50 % IV SOLN
INTRAVENOUS | Status: AC
  Administered 2020-12-20: 25 mL
  Filled 2020-12-20: qty 50

## 2020-12-20 MED ORDER — MAGNESIUM SULFATE IN D5W 1-5 GM/100ML-% IV SOLN
1.0000 g | Freq: Once | INTRAVENOUS | Status: AC
  Administered 2020-12-20: 1 g via INTRAVENOUS
  Filled 2020-12-20: qty 100

## 2020-12-20 MED ORDER — CEFAZOLIN SODIUM-DEXTROSE 2-4 GM/100ML-% IV SOLN: 2.0000 g | Freq: Once | INTRAVENOUS | Status: DC

## 2020-12-20 MED ORDER — DIPHENHYDRAMINE HCL 50 MG/ML IJ SOLN: 50.0000 mg | Freq: Once | INTRAMUSCULAR | Status: DC | PRN

## 2020-12-20 MED ORDER — FENTANYL CITRATE (PF) 100 MCG/2ML IJ SOLN
INTRAMUSCULAR | Status: AC
  Filled 2020-12-20: qty 2

## 2020-12-20 MED ORDER — ONDANSETRON HCL 4 MG/2ML IJ SOLN
4.0000 mg | Freq: Four times a day (QID) | INTRAMUSCULAR | Status: DC | PRN
  Administered 2020-12-23 (×2): 4 mg via INTRAVENOUS

## 2020-12-20 MED ORDER — HYDROMORPHONE HCL 1 MG/ML IJ SOLN: 1.0000 mg | Freq: Once | INTRAMUSCULAR | Status: DC | PRN

## 2020-12-20 MED ORDER — MORPHINE SULFATE (PF) 2 MG/ML IV SOLN
INTRAVENOUS | Status: AC
  Filled 2020-12-20: qty 1

## 2020-12-20 MED ORDER — INSULIN ASPART 100 UNIT/ML ~~LOC~~ SOLN
0.0000 [IU] | SUBCUTANEOUS | Status: DC
  Administered 2020-12-20: 3 [IU] via SUBCUTANEOUS
  Administered 2020-12-21: 2 [IU] via SUBCUTANEOUS
  Administered 2020-12-21 (×2): 1 [IU] via SUBCUTANEOUS
  Administered 2020-12-21: 5 [IU] via SUBCUTANEOUS
  Administered 2020-12-22 – 2020-12-23 (×5): 1 [IU] via SUBCUTANEOUS
  Filled 2020-12-20 (×8): qty 1

## 2020-12-20 MED ORDER — FENTANYL CITRATE (PF) 100 MCG/2ML IJ SOLN
INTRAMUSCULAR | Status: DC | PRN
  Administered 2020-12-20 (×2): 25 ug via INTRAVENOUS
  Administered 2020-12-20: 50 ug via INTRAVENOUS
  Administered 2020-12-20 (×3): 25 ug via INTRAVENOUS

## 2020-12-20 MED ORDER — FAMOTIDINE 20 MG PO TABS: 40.0000 mg | ORAL_TABLET | Freq: Once | ORAL | Status: DC | PRN

## 2020-12-20 MED ORDER — MIDAZOLAM HCL 5 MG/5ML IJ SOLN
INTRAMUSCULAR | Status: AC
  Filled 2020-12-20: qty 5

## 2020-12-20 MED ORDER — IODIXANOL 320 MG/ML IV SOLN
INTRAVENOUS | Status: DC | PRN
  Administered 2020-12-20: 95 mL

## 2020-12-20 MED ORDER — HEPARIN SODIUM (PORCINE) 1000 UNIT/ML IJ SOLN
INTRAMUSCULAR | Status: DC | PRN
  Administered 2020-12-20: 5000 [IU] via INTRAVENOUS

## 2020-12-20 SURGICAL SUPPLY — 32 items
BALLN COYOTE OTW 3X100X150 (BALLOONS) ×2
BALLN DORADO 5X200X135 (BALLOONS) ×2
BALLN LUTONIX 018 4X300X130 (BALLOONS) ×2
BALLN STERLING OTW 3X150X150 (BALLOONS) ×2
BALLN ULTRVRSE 3X300X150 (BALLOONS) ×2
BALLN ULTRVRSE 3X300X150 OTW (BALLOONS) ×1
BALLN ULTRVRSE 4X150X150 (BALLOONS) ×2
BALLN ULTRVRSE 5X80X150 (BALLOONS) ×2
BALLOON COYOTE OTW 3X100X150 (BALLOONS) IMPLANT
BALLOON DORADO 5X200X135 (BALLOONS) IMPLANT
BALLOON LUTONIX 018 4X300X130 (BALLOONS) IMPLANT
BALLOON STERLING OTW 3X150X150 (BALLOONS) IMPLANT
BALLOON ULTRVRSE 3X300X150 OTW (BALLOONS) IMPLANT
BALLOON ULTRVRSE 4X150X150 (BALLOONS) IMPLANT
BALLOON ULTRVRSE 5X80X150 (BALLOONS) IMPLANT
CATH ANGIO 5F PIGTAIL 65CM (CATHETERS) ×1 IMPLANT
CATH NAVICROSS ANGLED 90CM (MICROCATHETER) ×1 IMPLANT
CATH SEEKER .035X90 (CATHETERS) ×1 IMPLANT
CATH VERT 5X100 (CATHETERS) ×1 IMPLANT
COVER PROBE U/S 5X48 (MISCELLANEOUS) ×1 IMPLANT
DEVICE STARCLOSE SE CLOSURE (Vascular Products) ×1 IMPLANT
GLIDEWIRE ADV .035X260CM (WIRE) ×1 IMPLANT
KIT ENCORE 26 ADVANTAGE (KITS) ×1 IMPLANT
PACK ANGIOGRAPHY (CUSTOM PROCEDURE TRAY) ×2 IMPLANT
SHEATH ANL2 6FRX45 HC (SHEATH) ×1 IMPLANT
SHEATH BRITE TIP 5FRX11 (SHEATH) ×1 IMPLANT
STENT VIABAHN 6X250X120 (Permanent Stent) ×1 IMPLANT
SYR MEDRAD MARK 7 150ML (SYRINGE) ×1 IMPLANT
TUBING CONTRAST HIGH PRESS 72 (TUBING) ×1 IMPLANT
WIRE G V18X300CM (WIRE) ×1 IMPLANT
WIRE GUIDERIGHT .035X150 (WIRE) ×1 IMPLANT
WIRE RUNTHROUGH .014X300CM (WIRE) ×1 IMPLANT

## 2020-12-20 NOTE — Progress Notes (Signed)
Inpatient Diabetes Program Recommendations  AACE/ADA: New Consensus Statement on Inpatient Glycemic Control (2015)  Target Ranges:  Prepandial:   less than 140 mg/dL      Peak postprandial:   less than 180 mg/dL (1-2 hours)      Critically ill patients:  140 - 180 mg/dL   Lab Results  Component Value Date   GLUCAP 112 (H) 12/15/2020   HGBA1C 7.6 (H) 12/14/2020    Review of Glycemic Control Results for Lindsay Harmon, Lindsay Harmon (MRN [id number]) as of 12/14/2020 10:34  Ref. Range 12/19/2020 02:34 12/19/2020 02:45 12/19/2020 03:17 12/19/2020 04:00 12/19/2020 05:12 12/19/2020 05:13 12/19/2020 07:29 12/19/2020 09:53 12/19/2020 11:48 12/19/2020 16:06 12/19/2020 20:29 12/02/2020 00:16 12/10/2020 04:12 12/12/2020 07:26 12/10/2020 08:14  Glucose-Capillary Latest Ref Range: 70 - 99 mg/dL 27 (LL) 99 172 (H) 269 (H) 354 (H) 435 (H) 259 (H) 193 (H) 182 (H) 193 (H) 238 (H) 125 (H) 81 60 (L) 112 (H)   Diabetes history: DM 2 Outpatient Diabetes medications: Lantus 13 units daily Current orders for Inpatient glycemic control:  Novolog moderate q 4 hours Inpatient Diabetes Program Recommendations:   Please reduce Novolog correction to very sensitive (0-6 units ) q 4 hours. May eventually need some basal added back as well.  Will follow.   Thanks,  Adah Perl, RN, BC-ADM Inpatient Diabetes Coordinator Pager [phone] (8a-5p)

## 2020-12-20 NOTE — Interval H&P Note (Signed)
History and Physical Interval Note:  12/13/2020 3:08 PM  Lindsay Harmon  has presented today for surgery, with the diagnosis of Recurrent cellulitis.  The various methods of treatment have been discussed with the patient and family. After consideration of risks, benefits and other options for treatment, the patient has consented to  Procedure(s): Lower Extremity Angiography (Left) as a surgical intervention.  The patient's history has been reviewed, patient examined, no change in status, stable for surgery.  I have reviewed the patient's chart and labs.  Questions were answered to the patient's satisfaction.     Leotis Pain

## 2020-12-20 NOTE — Telephone Encounter (Signed)
I agree  Okay to build back into using Tyvaso

## 2020-12-20 NOTE — Progress Notes (Addendum)
PROGRESS NOTE    Lindsay Harmon   OEH:[id number]  DOB: 1956/01/20  PCP: Venia Carbon, MD    DOA: 12/09/2020 LOS: 2   Brief Narrative   65 y/o female with past medical history of chronic diastolic CHF, CAD, CKD stage 4, chronic respiratory failure with hypoxia due to COPD on 4 L/min home O2, HTN, uncontrolled type 2 diabetes with polyneuropathy and PVD, recently hospitalized for right lower extremity cellulitis from 1/28-2/1.  She was sent to the hospital from podiatrist office for IV antibiotics and vascular surgery consultation due to recurrent cellulitis, this time of her left lower extremity.    Patient admitted with vascular surgery and podiatry consulted.  Cellulitis being treated with IV Vancomycin.  Vascular surgery plans for angiogram of LLE for further evaluation and potential revascularization.     Assessment & Plan   Principal Problem:   Cellulitis of left lower leg Active Problems:   Type 2 diabetes mellitus with polyneuropathy, controlled (HCC)   Essential hypertension, benign   CAD (coronary artery disease)   PVD (peripheral vascular disease) (HCC)   Chronic renal disease, stage IV (HCC)   Chronic diastolic heart failure (HCC)   Chronic respiratory failure with hypoxia (HCC)   COPD (chronic obstructive pulmonary disease) (HCC)   Recurrent cellulitis   Cellulitis   Hypoglycemia associated with diabetes ([place])   Cellulitis of left leg   Uncontrolled type 2 diabetes mellitus with hyperglycemia (HCC)   Diabetic polyneuropathy (HCC)   Chronic diastolic CHF (congestive heart failure) (HCC)   COPD (chronic obstructive pulmonary disease) with emphysema (HCC)   Chronic pain syndrome   Cellulitis of the left lower extremity -with history of recurrent lower extremity cellulitis, with recent admission 1/28-2/1 for RLE cellulitis requiring IV antibiotics.  X-ray of the left foot was negative for signs of osteomyelitis. --Vascular surgery is consulted, plan for  angiogram --Podiatry consulted --Continue empiric vancomycin  --On IV fluids: NS at 75 cc/h --Pain control as needed --Elevate legs when in bed  Peripheral vascular disease -complicated by recurring cellulitis of bilateral lower extremities.   --Vascular surgery following, angiogram this afternoon  Hypokalemia -replaced.  Monitor BMP and replace as needed  Hypoglycemia -in the setting of n.p.o. status.  Reduce sliding scale NovoLog to very sensitive (0-6 units) every 4 hours while n.p.o.  Diabetes coordinators following, recommendations appreciated. --Hypoglycemia protocol  Type 2 diabetes with hyperglycemia, polyneuropathy -hemoglobin A1c in January was 7.4%, uncontrolled. --Sliding scale NovoLog --Continue gabapentin  Chronic diastolic CHF -patient appears euvolemic and currently well compensated.   --Monitor volume status while receiving IV fluids  Essential hypertension -with soft BPs, antihypertensives are held.  Coronary artery disease -stable with no active chest pain.  Continue aspirin and statin.  CKD stage IV -renal function is near baseline.  Continue vitamin D, Rocaltrol  Chronic respiratory failure with hypoxia due to COPD -not acutely exacerbated, no wheezing on exam.  Continue duo nebs, albuterol as needed, Claritin, Tyvaso.   Titrate oxygen to maintain O2 sat greater than 88%.  Chronic pain syndrome -followed by pain management.  Continue outpatient methadone.  Morphine as needed.  Will discontinue Norco and monitor.  Generalized anxiety -continue as needed Xanax  DVT prophylaxis: enoxaparin (LOVENOX) injection 30 mg Start: 12/05/2020 2200   Diet:  Diet Orders (From admission, onward)    Start     Ordered   12/19/20 1716  Diet heart healthy/carb modified Room service appropriate? Yes; Fluid consistency: Thin  Diet effective now  Comments: N.p.o. after midnight  Question Answer Comment  Diet-HS Snack? Nothing   Room service appropriate? Yes   Fluid  consistency: Thin      12/19/20 1717            Code Status: Full Code    Subjective 12/08/2020    Patient seen at bedside today.  Denies fevers or chills.  States that she overall feels okay.  Still does have a little tenderness in the left leg.  No other acute complaints.   Disposition Plan & Communication   Status is: Inpatient  Remains inpatient appropriate because:IV treatments appropriate due to intensity of illness or inability to take PO   Dispo: The patient is from: Home              Anticipated d/c is to: Home              Anticipated d/c date is: 2 days              Patient currently is not medically stable to d/c.   Difficult to place patient No   Family Communication: None at bedside, will attempt to call this afternoon   Consults, Procedures, Significant Events   Consultants:   Vascular surgery  Podiatry  Procedures:   2/23 -left lower extremity angiogram  Antimicrobials:  Anti-infectives (From admission, onward)   Start     Dose/Rate Route Frequency Ordered Stop   11/30/2020 2000  vancomycin (VANCOCIN) IVPB 1000 mg/200 mL premix        1,000 mg 200 mL/hr over 60 Minutes Intravenous Every 48 hours 12/13/2020 2132     12/17/2020 2200  vancomycin (VANCOREADY) IVPB 500 mg/100 mL        500 mg 100 mL/hr over 60 Minutes Intravenous  Once 11/29/2020 2131 12/19/20 0111   12/14/2020 2030  vancomycin (VANCOCIN) IVPB 1000 mg/200 mL premix        1,000 mg 200 mL/hr over 60 Minutes Intravenous  Once 12/12/2020 2020 12/05/2020 2211   12/12/2020 2015  piperacillin-tazobactam (ZOSYN) IVPB 3.375 g        3.375 g 100 mL/hr over 30 Minutes Intravenous  Once 12/11/2020 2011 12/10/2020 2141        Micro    Objective   Vitals:   12/19/20 2001 12/09/2020 0014 12/06/2020 0410 12/24/2020 0724  BP: 122/76 116/71 103/68 111/60  Pulse: 78 75 77 73  Resp: _0 Temp: 98.1 F (36.7 C) 98.2 F (36.8 C) 98.2 F (36.8 C) 98.3 F (36.8 C)  TempSrc:    Oral  SpO2: 93% 95% 95%  97%  Weight:      Height:       No intake or output data in the 24 hours ending 12/02/2020 0905 Filed Weights   12/14/2020 1602  Weight: 67.5 kg    Physical Exam:  General exam: awake, alert, no acute distress HEENT: moist mucus membranes, hearing grossly normal  Respiratory system: CTAB, no wheezes, rales or rhonchi, normal respiratory effort. Cardiovascular system: normal S1/S2, RRR, 3/6 blowing systolic murmur, no pedal edema.   Gastrointestinal system: soft, NT, ND, +bowel sounds. Central nervous system: A&O x3. no gross focal neurologic deficits, normal speech Extremities: Left distal lower extremity is erythematous with mild edema and mildly tender on palpation, differential warmth between the distal lower extremities with left side warmer to touch, normal tone, moves all extremities Psychiatry: normal mood, congruent affect, judgement and insight appear normal  Labs   Data Reviewed: I have  personally reviewed following labs and imaging studies  CBC: Recent Labs  Lab 12/05/2020 1604 12/19/20 0017 12/19/20 0604 11/28/2020 0603  WBC 9.2 7.7 11.6* 6.8  NEUTROABS 7.3  --   --  4.4  HGB 14.2 12.9 13.1 13.2  HCT 44.0 40.7 39.5 41.4  MCV 93.0 93.6 92.7 93.7  PLT 249 202 191 474   Basic Metabolic Panel: Recent Labs  Lab 12/19/2020 1604 12/19/20 0017 12/19/20 0604 11/28/2020 0603  NA 137  --  135 140  K 3.5  --  3.1* 3.6  CL 90*  --  90* 97*  CO2 35*  --  33* 34*  GLUCOSE 53*  --  312* 69*  BUN 38*  --  35* 36*  CREATININE 2.02* 1.97* 2.07* 1.94*  CALCIUM 9.5  --  8.8* 9.1  MG  --   --   --  1.6*  PHOS  --   --   --  3.4   GFR: Estimated Creatinine Clearance: 24.7 mL/min (A) (by C-G formula based on SCr of 1.94 mg/dL (H)). Liver Function Tests: Recent Labs  Lab 12/09/2020 1604 12/22/2020 0603  AST 33 21  ALT 19 15  ALKPHOS 52 45  BILITOT 1.7* 1.0  PROT 6.7 5.6*  ALBUMIN 3.3* 2.6*   No results for input(s): LIPASE, AMYLASE in the last 168 hours. No results for  input(s): AMMONIA in the last 168 hours. Coagulation Profile: Recent Labs  Lab 12/12/2020 1604  INR 1.7*   Cardiac Enzymes: No results for input(s): CKTOTAL, CKMB, CKMBINDEX, TROPONINI in the last 168 hours. BNP (last 3 results) No results for input(s): PROBNP in the last 8760 hours. HbA1C: No results for input(s): HGBA1C in the last 72 hours. CBG: Recent Labs  Lab 12/19/20 2029 12/13/2020 0016 12/03/2020 0412 12/17/2020 0726 11/29/2020 0814  GLUCAP 238* 125* 81 60* 112*   Lipid Profile: Recent Labs    12/16/2020 0603  CHOL 87  HDL 38*  LDLCALC 35  TRIG 71  CHOLHDL 2.3   Thyroid Function Tests: No results for input(s): TSH, T4TOTAL, FREET4, T3FREE, THYROIDAB in the last 72 hours. Anemia Panel: No results for input(s): VITAMINB12, FOLATE, FERRITIN, TIBC, IRON, RETICCTPCT in the last 72 hours. Sepsis Labs: Recent Labs  Lab 12/04/2020 2051 12/19/20 1022 12/19/20 1230 12/19/20 1540  LATICACIDVEN 1.9 1.3 1.5 1.7    Recent Results (from the past 240 hour(s))  Culture, blood (Routine x 2)     Status: None (Preliminary result)   Collection Time: 12/21/2020  4:10 PM   Specimen: BLOOD  Result Value Ref Range Status   Specimen Description BLOOD LEFT ANTECUBITAL  Final   Special Requests   Final    BOTTLES DRAWN AEROBIC AND ANAEROBIC Blood Culture adequate volume   Culture   Final    NO GROWTH 2 DAYS Performed at [facility], [address], [place], [place] [address]    Report Status PENDING  Incomplete  SARS CORONAVIRUS 2 (TAT 6-24 HRS) Nasopharyngeal Nasopharyngeal Swab     Status: None   Collection Time: 12/14/2020  8:51 PM   Specimen: Nasopharyngeal Swab  Result Value Ref Range Status   SARS Coronavirus 2 NEGATIVE NEGATIVE Final    Comment: (NOTE) SARS-CoV-2 target nucleic acids are NOT DETECTED.  The SARS-CoV-2 RNA is generally detectable in upper and lower respiratory specimens during the acute phase of infection. Negative results do not preclude SARS-CoV-2  infection, do not rule out co-infections with other pathogens, and should not be used as the sole  basis for treatment or other patient management decisions. Negative results must be combined with clinical observations, patient history, and epidemiological information. The expected result is Negative.  Fact Sheet for Patients: SugarRoll.be  Fact Sheet for Healthcare Providers: https://www.woods-mathews.com/  This test is not yet approved or cleared by the [place] FDA and  has been authorized for detection and/or diagnosis of SARS-CoV-2 by FDA under an Emergency Use Authorization (EUA). This EUA will remain  in effect (meaning this test can be used) for the duration of the COVID-19 declaration under Se ction 564(b)(1) of the Act, 21 U.S.C. section 360bbb-3(b)(1), unless the authorization is terminated or revoked sooner.  Performed at [facility], [place] [address], [place], [place] [address]   Culture, blood (Routine x 2)     Status: None (Preliminary result)   Collection Time: 12/25/2020  9:00 PM   Specimen: BLOOD  Result Value Ref Range Status   Specimen Description BLOOD BLOOD RIGHT FOREARM  Final   Special Requests   Final    BOTTLES DRAWN AEROBIC AND ANAEROBIC Blood Culture adequate volume   Culture  Setup Time   Final    Organism ID to follow GRAM POSITIVE COCCI AEROBIC BOTTLE ONLY CRITICAL RESULT CALLED TO, READ BACK BY AND VERIFIED WITH: NATHAN BELUE _0  ON 12/09/2020 SKL Performed at [facility], [address] [place], [place] [address]    Culture GRAM POSITIVE COCCI  Final   Report Status PENDING  Incomplete  Blood Culture ID Panel (Reflexed)     Status: Abnormal   Collection Time: 12/06/2020  9:00 PM  Result Value Ref Range Status   Enterococcus faecalis NOT DETECTED NOT DETECTED Final   Enterococcus Faecium NOT DETECTED NOT DETECTED Final   Listeria monocytogenes NOT DETECTED NOT DETECTED Final    Staphylococcus species DETECTED (A) NOT DETECTED Final    Comment: CRITICAL RESULT CALLED TO, READ BACK BY AND VERIFIED WITH: NATHAN BELUE _1  ON 12/24/2020 SKL    Staphylococcus aureus (BCID) NOT DETECTED NOT DETECTED Final   Staphylococcus epidermidis DETECTED (A) NOT DETECTED Final    Comment: Methicillin (oxacillin) resistant coagulase negative staphylococcus. Possible blood culture contaminant (unless isolated from more than one blood culture draw or clinical case suggests pathogenicity). No antibiotic treatment is indicated for blood  culture contaminants. CRITICAL RESULT CALLED TO, READ BACK BY AND VERIFIED WITH: NATHAN BELUE _2  ON 12/05/2020 SKL    Staphylococcus lugdunensis NOT DETECTED NOT DETECTED Final   Streptococcus species NOT DETECTED NOT DETECTED Final   Streptococcus agalactiae NOT DETECTED NOT DETECTED Final   Streptococcus pneumoniae NOT DETECTED NOT DETECTED Final   Streptococcus pyogenes NOT DETECTED NOT DETECTED Final   A.calcoaceticus-baumannii NOT DETECTED NOT DETECTED Final   Bacteroides fragilis NOT DETECTED NOT DETECTED Final   Enterobacterales NOT DETECTED NOT DETECTED Final   Enterobacter cloacae complex NOT DETECTED NOT DETECTED Final   Escherichia coli NOT DETECTED NOT DETECTED Final   Klebsiella aerogenes NOT DETECTED NOT DETECTED Final   Klebsiella oxytoca NOT DETECTED NOT DETECTED Final   Klebsiella pneumoniae NOT DETECTED NOT DETECTED Final   Proteus species NOT DETECTED NOT DETECTED Final   Salmonella species NOT DETECTED NOT DETECTED Final   Serratia marcescens NOT DETECTED NOT DETECTED Final   Haemophilus influenzae NOT DETECTED NOT DETECTED Final   Neisseria meningitidis NOT DETECTED NOT DETECTED Final   Pseudomonas aeruginosa NOT DETECTED NOT DETECTED Final   Stenotrophomonas maltophilia NOT DETECTED NOT DETECTED Final   Candida albicans NOT DETECTED NOT DETECTED Final  Candida auris NOT DETECTED NOT DETECTED Final   Candida glabrata NOT  DETECTED NOT DETECTED Final   Candida krusei NOT DETECTED NOT DETECTED Final   Candida parapsilosis NOT DETECTED NOT DETECTED Final   Candida tropicalis NOT DETECTED NOT DETECTED Final   Cryptococcus neoformans/gattii NOT DETECTED NOT DETECTED Final   Methicillin resistance mecA/C DETECTED (A) NOT DETECTED Final    Comment: CRITICAL RESULT CALLED TO, READ BACK BY AND VERIFIED WITH: NATHAN BELUE _0  ON 12/15/2020 SKL Performed at [facility], [address], [place], [place] [address]   MRSA PCR Screening     Status: None   Collection Time: 12/19/20 11:13 PM   Specimen: Nasal Mucosa; Nasopharyngeal  Result Value Ref Range Status   MRSA by PCR NEGATIVE NEGATIVE Final    Comment:        The GeneXpert MRSA Assay (FDA approved for NASAL specimens only), is one component of a comprehensive MRSA colonization surveillance program. It is not intended to diagnose MRSA infection nor to guide or monitor treatment for MRSA infections. Performed at [facility], [address] [place], [place] [address]       Imaging Studies   DG Foot Complete Left  Result Date: 12/14/2020 CLINICAL DATA:  Foot cellulitis with ankle ulceration EXAM: LEFT FOOT - COMPLETE 3+ VIEW COMPARISON:  None. FINDINGS: No fracture or malalignment. Mild degenerative changes at the first MTP joint. No soft tissue emphysema. No bony destructive change or periostitis. IMPRESSION: Mild degenerative changes at the first MTP joint. No acute osseous abnormality. Electronically Signed   By: Donavan Foil M.D.   On: 12/03/2020 20:43     Medications   Scheduled Meds: . aspirin EC  81 mg Oral Daily  . calcitRIOL  0.5 mcg Oral Daily  . cholecalciferol  1,000 Units Oral QHS  . enoxaparin (LOVENOX) injection  30 mg Subcutaneous Q24H  . ferrous sulfate  325 mg Oral Q breakfast  . gabapentin  300 mg Oral Daily  . gabapentin  600 mg Oral QHS  . insulin aspart  0-15 Units Subcutaneous Q4H  . loratadine  10 mg  Oral BID  . pantoprazole  40 mg Oral BID  . pravastatin  80 mg Oral q1800  . Treprostinil  18 mcg Inhalation QID   Continuous Infusions: . sodium chloride 75 mL/hr at 12/07/2020 0811  . magnesium sulfate bolus IVPB    . vancomycin         LOS: 2 days    Time spent: 30 minutes    Ezekiel Slocumb, DO [facility]  12/17/2020, 9:05 AM      If 7PM-7AM, please contact night-coverage. How to contact the [facility] Attending or Consulting provider [place] or covering provider during after hours [place], for this patient?    1. Check the care team in [facility] and look for a) attending/consulting [facility] provider listed and b) the [facility] team listed 2. Log into [email or web address] and use 's universal password to access. If you do not have the password, please contact the hospital operator. 3. Locate the [facility] provider you are looking for under [facility] and page to a number that you can be directly reached. 4. If you still have difficulty reaching the provider, please page the [facility] (Director on Call) for the Hospitalists listed on amion for assistance.

## 2020-12-20 NOTE — Hospital Course (Addendum)
65 y/o female with past medical history of chronic diastolic CHF, CAD, CKD stage 4, chronic respiratory failure with hypoxia due to severe pulmonary hypertension and COPD/emphysema on 4 L/min home O2, HTN, uncontrolled type 2 diabetes with polyneuropathy and PVD, recently hospitalized for right lower extremity cellulitis from 1/28-2/1.  She was sent to the hospital from podiatrist office for IV antibiotics and vascular surgery consultation due to recurrent cellulitis, this time of her left lower extremity.    Patient admitted with vascular surgery and podiatry consulted.  Cellulitis treated with IV Vancomycin.  Vascular surgery took patient for LLE angiogram and revascularization on 2/23.  During that procedure, severe atherosclerotic disease was noted to the right lower extremity arterial system that was not amenable to endovascular treatment.  Femoral endarterectomy planned for Monday 2/28.  Patient remained on antibiotics for cellulitis which showed marked clinical improvement.

## 2020-12-20 NOTE — Progress Notes (Signed)
Pt given food and gingerale to aid in increasing blood sugar. Will recheck in 15 minutes

## 2020-12-20 NOTE — Op Note (Signed)
[facility]  Percutaneous Study/Intervention Procedural Note   Date of Surgery: 12/12/2020  Surgeon(s):Lenvil Swaim    Assistants:none  Pre-operative Diagnosis: PAD with ulceration and infection bilateral lower extremities  Post-operative diagnosis:  Same  Procedure(s) Performed:             1.  Ultrasound guidance for vascular access right femoral artery             2.  Catheter placement into left common femoral artery from right femoral approach             3.  Aortogram and selective bilateral lower extremity angiograms             4.  Percutaneous transluminal angioplasty of left posterior tibial artery with 3 mm diameter angioplasty balloon             5.   Percutaneous transluminal angioplasty of left SFA and was proximal popliteal artery with 4 mm diameter Lutonix drug-coated angioplasty balloon  6.  Viabahn stent placement left SFA with 6 mm diameter by 25 cm length stent for residual stenosis after angioplasty             7.  StarClose closure device right femoral artery  EBL: 5 cc  Contrast: 95 cc  Fluoro Time: 14.1 minutes  Moderate Conscious Sedation Time: approximately 1 hour and 6 minutes using 4 mg of Versed and 175 mcg of Fentanyl              Indications:  Patient is a 65 y.o.female with cellulitis and pain in both feet and lower legs. The patient is brought in for angiography for further evaluation and potential treatment.  Due to the limb threatening nature of the situation, angiogram was performed for attempted limb salvage. The patient is aware that if the procedure fails, amputation would be expected.  The patient also understands that even with successful revascularization, amputation may still be required due to the severity of the situation.  Risks and benefits are discussed and informed consent is obtained.   Procedure:  The patient was identified and appropriate procedural time out was performed.  The patient was then placed supine on  the table and prepped and draped in the usual sterile fashion. Moderate conscious sedation was administered during a face to face encounter with the patient throughout the procedure with my supervision of the RN administering medicines and monitoring the patient's vital signs, pulse oximetry, telemetry and mental status throughout from the start of the procedure until the patient was taken to the recovery room. Ultrasound was used to evaluate the right common femoral artery.  It was patent .  A digital ultrasound image was acquired.  A Seldinger needle was used to access the right common femoral artery under direct ultrasound guidance and a permanent image was performed.  A 0.035 J wire was advanced without resistance and a 5Fr sheath was placed.  Pigtail catheter was placed into the aorta and an AP aortogram was performed. This demonstrated that both common iliac arteries had at least mild disease in her iliac arteries are quite small but did not appear to have high-grade stenosis.  The aorta was small and calcific.  The renal arteries appeared to be patent. I then crossed the aortic bifurcation and advanced to the left femoral head. Selective left lower extremity angiogram was then performed. This demonstrated a flush occlusion of the SFA with reconstitution at Hunter's canal.  The popliteal artery in the mid to distal  segment normalized.  There is normal tibial trifurcation.  Initially, the tibial vessels were not vertically well opacified and there was seen better once across the occlusion.  The posterior tibial artery was the dominant runoff to the foot but had a focal high-grade stenosis of 80 to 90% in the proximal to mid segment.  The peroneal artery and anterior tibial arteries were small without focal stenosis. It was felt that it was in the patient's best interest to proceed with intervention after these images to avoid a second procedure and a larger amount of contrast and fluoroscopy based off of the  findings from the initial angiogram. The patient was systemically heparinized and a 6 [place] Ansell sheath was then placed over the [employer] wire. I then used a Kumpe catheter and the advantage wire to navigate through the occlusion of the SFA without difficulty and get down to the tibial vessels.  We then saw the tibial stenosis in the posterior tibial artery which was the dominant runoff distally and cross this lesion with first a V 18 wire later exchanging for a 0.014 wire.  Balloon angioplasty was then performed the posterior tibial artery in the mid proximal segment with 3 mm diameter angioplasty balloon inflated to 8 atm for 1 minute.  I then used a 3 mm diameter angioplasty balloon to predilate the SFA lesion which was highly calcific and small.  A 4 mm diameter by 22 cm length Lutonix drug-coated angioplasty balloon and then a 4 mm diameter by 15 cm length conventional angioplasty balloon were used to treat the entire SFA and the proximal popliteal artery.  These inflations were 10 to 12 atm for 1 minute.  Completion imaging showed long segment residual stenosis after angioplasty and a 6 mm diameter by 25 cm length Viabahn stent was then deployed from just below the origin of the SFA down to just above Hunter's canal and postdilated with 5 mm balloons with excellent angiographic ablation result and less than 10% residual stenosis.  I then pulled back the sheath to the ipsilateral external iliac artery to perform right lower extremity angiogram as she had significant disease on this side as well and infection on the right foot as well. This demonstrated about a 90% stenosis in the common femoral artery just below the access site.  The SFA was then diffusely diseased with multiple areas of greater than 70% stenosis and what appeared to be a short segment occlusion in the mid to distal segment.  The popliteal artery was fairly normal but the tibioperoneal trunk had what appeared to be a high-grade  stenosis of greater than 80% in the tibials were optically well-opacified distally. I elected to terminate the procedure. The sheath was removed and StarClose closure device was deployed in the right femoral artery with excellent hemostatic result. The patient was taken to the recovery room in stable condition having tolerated the procedure well.  Findings:               Aortogram:  Both common iliac arteries had at least mild disease in her iliac arteries are quite small but did not appear to have high-grade stenosis.  The aorta was small and calcific.  The renal arteries appeared to be patent.             Left lower Extremity:  This demonstrated a flush occlusion of the SFA with reconstitution at Hunter's canal.  The popliteal artery in the mid to distal segment normalized.  There is normal tibial trifurcation.  Initially, the tibial vessels were not vertically well opacified and there was seen better once across the occlusion.  The posterior tibial artery was the dominant runoff to the foot but had a focal high-grade stenosis of 80 to 90% in the proximal to mid segment.  The peroneal artery and anterior tibial arteries were small without focal stenosis  Right lower extremity: This demonstrated about a 90% stenosis in the common femoral artery just below the access site.  The SFA was then diffusely diseased with multiple areas of greater than 70% stenosis and what appeared to be a short segment occlusion in the mid to distal segment.  The popliteal artery was fairly normal but the tibioperoneal trunk had what appeared to be a high-grade stenosis of greater than 80% in the tibials were optically well-opacified distally.   Disposition: Patient was taken to the recovery room in stable condition having tolerated the procedure well.  Complications: None  Leotis Pain 11/29/2020 5:39 PM   This note was created with Dragon Medical transcription system. Any errors in dictation are purely unintentional.

## 2020-12-20 NOTE — Telephone Encounter (Signed)
Spoke with Sharyn Lull at [employer]. She verbalized understanding. Nothing further needed at time of call.

## 2020-12-20 NOTE — Progress Notes (Signed)
PHARMACY - PHYSICIAN COMMUNICATION CRITICAL VALUE ALERT - BLOOD CULTURE IDENTIFICATION (BCID)  1 (aerobic) bottle of 4 with Staph Epi, MECA detected.  Pt already on Vancomycin with pharmacy dosing.  No changes are warranted at this time.   Results for orders placed or performed during the hospital encounter of 04/19/18  Blood Culture ID Panel (Reflexed) (Collected: 04/19/2018 11:43 PM)  Result Value Ref Range   Enterococcus species NOT DETECTED NOT DETECTED   Listeria monocytogenes NOT DETECTED NOT DETECTED   Staphylococcus species NOT DETECTED NOT DETECTED   Staphylococcus aureus (BCID) NOT DETECTED NOT DETECTED   Streptococcus species NOT DETECTED NOT DETECTED   Streptococcus agalactiae NOT DETECTED NOT DETECTED   Streptococcus pneumoniae NOT DETECTED NOT DETECTED   Streptococcus pyogenes NOT DETECTED NOT DETECTED   Acinetobacter baumannii NOT DETECTED NOT DETECTED   Enterobacteriaceae species NOT DETECTED NOT DETECTED   Enterobacter cloacae complex NOT DETECTED NOT DETECTED   Escherichia coli NOT DETECTED NOT DETECTED   Klebsiella oxytoca NOT DETECTED NOT DETECTED   Klebsiella pneumoniae NOT DETECTED NOT DETECTED   Proteus species NOT DETECTED NOT DETECTED   Serratia marcescens NOT DETECTED NOT DETECTED   Haemophilus influenzae NOT DETECTED NOT DETECTED   Neisseria meningitidis NOT DETECTED NOT DETECTED   Pseudomonas aeruginosa NOT DETECTED NOT DETECTED   Candida albicans NOT DETECTED NOT DETECTED   Candida glabrata NOT DETECTED NOT DETECTED   Candida krusei NOT DETECTED NOT DETECTED   Candida parapsilosis NOT DETECTED NOT DETECTED   Candida tropicalis NOT DETECTED NOT DETECTED    Name of Provider Contacted: Jonny Ruiz, NP  Changes to prescribed antibiotics required: None  Renda Rolls, PharmD, [facility] 12/15/2020 2:17 AM

## 2020-12-21 ENCOUNTER — Encounter: Payer: Self-pay | Admitting: Vascular Surgery

## 2020-12-21 DIAGNOSIS — L03115 Cellulitis of right lower limb: Secondary | ICD-10-CM | POA: Diagnosis not present

## 2020-12-21 DIAGNOSIS — Z4659 Encounter for fitting and adjustment of other gastrointestinal appliance and device: Secondary | ICD-10-CM | POA: Diagnosis not present

## 2020-12-21 DIAGNOSIS — Z01818 Encounter for other preprocedural examination: Secondary | ICD-10-CM | POA: Diagnosis not present

## 2020-12-21 DIAGNOSIS — I10 Essential (primary) hypertension: Secondary | ICD-10-CM | POA: Diagnosis not present

## 2020-12-21 DIAGNOSIS — L03116 Cellulitis of left lower limb: Secondary | ICD-10-CM | POA: Diagnosis not present

## 2020-12-21 DIAGNOSIS — J9611 Chronic respiratory failure with hypoxia: Secondary | ICD-10-CM | POA: Diagnosis not present

## 2020-12-21 DIAGNOSIS — I5032 Chronic diastolic (congestive) heart failure: Secondary | ICD-10-CM | POA: Diagnosis not present

## 2020-12-21 LAB — BASIC METABOLIC PANEL
Anion gap: 12 (ref 5–15)
BUN: 27 mg/dL — ABNORMAL HIGH (ref 8–23)
CO2: 32 mmol/L (ref 22–32)
Calcium: 9.7 mg/dL (ref 8.9–10.3)
Chloride: 96 mmol/L — ABNORMAL LOW (ref 98–111)
Creatinine, Ser: 1.7 mg/dL — ABNORMAL HIGH (ref 0.44–1.00)
GFR, Estimated: 33 mL/min — ABNORMAL LOW (ref >60)
Glucose, Bld: 140 mg/dL — ABNORMAL HIGH (ref 70–99)
Potassium: 3.8 mmol/L (ref 3.5–5.1)
Sodium: 140 mmol/L (ref 135–145)

## 2020-12-21 LAB — GLUCOSE, CAPILLARY
Glucose-Capillary: 115 mg/dL — ABNORMAL HIGH (ref 70–99)
Glucose-Capillary: 127 mg/dL — ABNORMAL HIGH (ref 70–99)
Glucose-Capillary: 157 mg/dL — ABNORMAL HIGH (ref 70–99)
Glucose-Capillary: 164 mg/dL — ABNORMAL HIGH (ref 70–99)
Glucose-Capillary: 170 mg/dL — ABNORMAL HIGH (ref 70–99)
Glucose-Capillary: 181 mg/dL — ABNORMAL HIGH (ref 70–99)
Glucose-Capillary: 217 mg/dL — ABNORMAL HIGH (ref 70–99)
Glucose-Capillary: 258 mg/dL — ABNORMAL HIGH (ref 70–99)
Glucose-Capillary: 83 mg/dL (ref 70–99)

## 2020-12-21 LAB — CBC WITH DIFFERENTIAL/PLATELET
Abs Immature Granulocytes: 0.03 K/uL (ref 0.00–0.07)
Basophils Absolute: 0.1 K/uL (ref 0.0–0.1)
Basophils Relative: 1 %
Eosinophils Absolute: 0.1 K/uL (ref 0.0–0.5)
Eosinophils Relative: 1 %
HCT: 45.2 % (ref 36.0–46.0)
Hemoglobin: 14.2 g/dL (ref 12.0–15.0)
Immature Granulocytes: 0 %
Lymphocytes Relative: 14 %
Lymphs Abs: 1.4 K/uL (ref 0.7–4.0)
MCH: 29.6 pg (ref 26.0–34.0)
MCHC: 31.4 g/dL (ref 30.0–36.0)
MCV: 94.2 fL (ref 80.0–100.0)
Monocytes Absolute: 0.8 K/uL (ref 0.1–1.0)
Monocytes Relative: 8 %
Neutro Abs: 7.1 K/uL (ref 1.7–7.7)
Neutrophils Relative %: 76 %
Platelets: 190 K/uL (ref 150–400)
RBC: 4.8 MIL/uL (ref 3.87–5.11)
RDW: 15.6 % — ABNORMAL HIGH (ref 11.5–15.5)
WBC: 9.5 K/uL (ref 4.0–10.5)
nRBC: 0 % (ref 0.0–0.2)

## 2020-12-21 LAB — PHOSPHORUS: Phosphorus: 3 mg/dL (ref 2.5–4.6)

## 2020-12-21 LAB — MAGNESIUM: Magnesium: 1.9 mg/dL (ref 1.7–2.4)

## 2020-12-21 NOTE — Progress Notes (Addendum)
PROGRESS NOTE    Lindsay Harmon   CHY:[id number]  DOB: 28-Jan-1956  PCP: Venia Carbon, MD    DOA: 11/30/2020 LOS: 3   Brief Narrative   65 y/o female with past medical history of chronic diastolic CHF, CAD, CKD stage 4, chronic respiratory failure with hypoxia due to COPD on 4 L/min home O2, HTN, uncontrolled type 2 diabetes with polyneuropathy and PVD, recently hospitalized for right lower extremity cellulitis from 1/28-2/1.  She was sent to the hospital from podiatrist office for IV antibiotics and vascular surgery consultation due to recurrent cellulitis, this time of her left lower extremity.    Patient admitted with vascular surgery and podiatry consulted.  Cellulitis being treated with IV Vancomycin.  Vascular surgery plans for angiogram of LLE for further evaluation and potential revascularization.     Assessment & Plan   Principal Problem:   Cellulitis of left lower leg Active Problems:   Type 2 diabetes mellitus with polyneuropathy, controlled (HCC)   Essential hypertension, benign   CAD (coronary artery disease)   PVD (peripheral vascular disease) (HCC)   Chronic renal disease, stage IV (HCC)   Chronic diastolic heart failure (HCC)   Chronic respiratory failure with hypoxia (HCC)   COPD (chronic obstructive pulmonary disease) (HCC)   Recurrent cellulitis   Cellulitis   Hypoglycemia associated with diabetes ([place])   Cellulitis of left leg   Uncontrolled type 2 diabetes mellitus with hyperglycemia (HCC)   Diabetic polyneuropathy (HCC)   Chronic diastolic CHF (congestive heart failure) (HCC)   COPD (chronic obstructive pulmonary disease) with emphysema (HCC)   Chronic pain syndrome   Cellulitis of the left lower extremity -with history of recurrent lower extremity cellulitis, with recent admission 1/28-2/1 for RLE cellulitis requiring IV antibiotics.   X-ray of the left foot was negative for signs of osteomyelitis. 2/23 -left lower extremity angiogram with  revascularization --Vascular surgery is consulted --Podiatry consulted --Continue empiric vancomycin  --Off IV fluids currently, monitor clinically, encourage oral hydration --Pain control as needed --Elevate legs when in bed  Peripheral vascular disease -complicated by recurring cellulitis of bilateral lower extremities.   --Vascular surgery following, angiogram this afternoon  Hypokalemia -replaced.  Monitor BMP and replace as needed  Hypoglycemia -in the setting of n.p.o. status.  Reduce sliding scale NovoLog to very sensitive (0-6 units) every 4 hours while n.p.o.  Diabetes coordinators following, recommendations appreciated. --Hypoglycemia protocol  Type 2 diabetes with hyperglycemia, polyneuropathy -hemoglobin A1c in January was 7.4%, uncontrolled. CBGs are currently at inpatient goal. --Sliding scale NovoLog --Consider addition of Lantus tomorrow if indicated --Diabetes coordinator following, recs appreciated --Continue gabapentin  Chronic diastolic CHF -patient appears euvolemic and currently well compensated.   --Monitor volume status while receiving IV fluids  Essential hypertension -with soft BPs, antihypertensives are held.  Coronary artery disease -stable with no active chest pain.  Continue aspirin and statin.  CKD stage IV -renal function is near baseline.  Continue vitamin D, Rocaltrol  Chronic respiratory failure with hypoxia due to COPD and Pulmonary HTN-  COPD not acutely exacerbated, no wheezing on exam.   Continue duo nebs, albuterol as needed, Claritin, Tyvaso.   Titrate oxygen to maintain O2 sat greater than 88%. (NOTE: Patient's Tyvaso was brought from home for use in the hospital.  Clinical pharmacist had requested RN to bring medication to the pharmacy however appears the machine was filled with medication before this occurred, therefore pharmacy is unable to appropriately verify the medication.  Holding the medication would be harmful  to patient so we  will continue it.)   Chronic pain syndrome -followed by pain management.  Continue outpatient methadone.  Morphine as needed.  Will discontinue Norco and monitor.  Generalized anxiety -continue as needed Xanax  DVT prophylaxis: enoxaparin (LOVENOX) injection 30 mg Start: 12/07/2020 2200   Diet:  Diet Orders (From admission, onward)    Start     Ordered   12/15/2020 1753  Diet Carb Modified Fluid consistency: Thin; Room service appropriate? Yes  Diet effective now       Question Answer Comment  Diet-HS Snack? Nothing   Calorie Level Medium 1600-2000   Fluid consistency: Thin   Room service appropriate? Yes      11/29/2020 1753            Code Status: Full Code    Subjective 12/21/20    Patient seen at bedside, getting ready to work with physical therapy.  Patient reports having more pain in her leg today as well as her low back, she thinks from being in the bed.  She denies fevers or chills.  She thinks her heart rate might be a little fast because of pain.  She denies any other acute complaints.  Disposition Plan & Communication   Status is: Inpatient  Remains inpatient appropriate because:IV treatments appropriate due to intensity of illness or inability to take PO.  Vascular surgery planning further intervention.   Dispo: The patient is from: Home              Anticipated d/c is to: Home              Anticipated d/c date is: 2 days              Patient currently is not medically stable to d/c.   Difficult to place patient No   Family Communication: None at bedside, will attempt to call this afternoon   Consults, Procedures, Significant Events   Consultants:   Vascular surgery  Podiatry  Procedures:   2/23 -left lower extremity angiogram  Antimicrobials:  Anti-infectives (From admission, onward)   Start     Dose/Rate Route Frequency Ordered Stop   12/06/2020 2000  vancomycin (VANCOCIN) IVPB 1000 mg/200 mL premix        1,000 mg 200 mL/hr over 60 Minutes  Intravenous Every 48 hours 12/12/2020 2132     12/04/2020 1530  ceFAZolin (ANCEF) IVPB 2g/100 mL premix  Status:  Discontinued       Note to Pharmacy: To be given in specials   2 g 200 mL/hr over 30 Minutes Intravenous  Once 12/21/2020 1521 12/09/2020 1753   11/29/2020 2200  vancomycin (VANCOREADY) IVPB 500 mg/100 mL        500 mg 100 mL/hr over 60 Minutes Intravenous  Once 12/22/2020 2131 12/19/20 0111   12/01/2020 2030  vancomycin (VANCOCIN) IVPB 1000 mg/200 mL premix        1,000 mg 200 mL/hr over 60 Minutes Intravenous  Once 12/15/2020 2020 12/17/2020 2211   11/29/2020 2015  piperacillin-tazobactam (ZOSYN) IVPB 3.375 g        3.375 g 100 mL/hr over 30 Minutes Intravenous  Once 12/16/2020 2011 12/02/2020 2141        Micro    Objective   Vitals:   12/21/20 0010 12/21/20 0320 12/21/20 0726 12/21/20 1201  BP: 137/76 131/83 (!) 148/87 (!) 142/82  Pulse: (!) 109 100 (!) 109 (!) 107  Resp: _0 Temp: 98.1 F (36.7 C) 98.4  F (36.9 C) 98.2 F (36.8 C) 98.2 F (36.8 C)  TempSrc: Oral Oral    SpO2: 100% 93% 97% 97%  Weight:      Height:        Intake/Output Summary (Last 24 hours) at 12/21/2020 1524 Last data filed at 12/21/2020 1012 Gross per 24 hour  Intake 240 ml  Output 500 ml  Net -260 ml   Filed Weights   12/04/2020 1602 12/06/2020 1459  Weight: 67.5 kg 67.1 kg    Physical Exam:  General exam: awake, alert, no acute distress HEENT: moist mucus membranes, hearing grossly normal  Respiratory system: CTAB with poor aeration but no wheezing, normal respiratory effort on 4 L/min nasal cannula oxygen. Cardiovascular system: normal S1/S2, RRR, 3/6 blowing systolic murmur, no pedal edema.   Central nervous system: A&O x3. no gross focal neurologic deficits, normal speech Extremities: Left distal lower extremity erythema is improved, less differential warmth of the lower extremities as well, no edema Psychiatry: normal mood, congruent affect, judgement and insight appear normal  Labs    Data Reviewed: I have personally reviewed following labs and imaging studies  CBC: Recent Labs  Lab 12/06/2020 1604 12/19/20 0017 12/19/20 0604 12/07/2020 0603 12/21/20 0636  WBC 9.2 7.7 11.6* 6.8 9.5  NEUTROABS 7.3  --   --  4.4 7.1  HGB 14.2 12.9 13.1 13.2 14.2  HCT 44.0 40.7 39.5 41.4 45.2  MCV 93.0 93.6 92.7 93.7 94.2  PLT 249 202 191 184 144   Basic Metabolic Panel: Recent Labs  Lab 12/10/2020 1604 12/19/20 0017 12/19/20 0604 12/09/2020 0603 12/21/20 0636  NA 137  --  135 140 140  K 3.5  --  3.1* 3.6 3.8  CL 90*  --  90* 97* 96*  CO2 35*  --  33* 34* 32  GLUCOSE 53*  --  312* 69* 140*  BUN 38*  --  35* 36* 27*  CREATININE 2.02* 1.97* 2.07* 1.94* 1.70*  CALCIUM 9.5  --  8.8* 9.1 9.7  MG  --   --   --  1.6* 1.9  PHOS  --   --   --  3.4 3.0   GFR: Estimated Creatinine Clearance: 28.1 mL/min (A) (by C-G formula based on SCr of 1.7 mg/dL (H)). Liver Function Tests: Recent Labs  Lab 12/19/2020 1604 12/02/2020 0603  AST 33 21  ALT 19 15  ALKPHOS 52 45  BILITOT 1.7* 1.0  PROT 6.7 5.6*  ALBUMIN 3.3* 2.6*   No results for input(s): LIPASE, AMYLASE in the last 168 hours. No results for input(s): AMMONIA in the last 168 hours. Coagulation Profile: Recent Labs  Lab 12/13/2020 1604  INR 1.7*   Cardiac Enzymes: No results for input(s): CKTOTAL, CKMB, CKMBINDEX, TROPONINI in the last 168 hours. BNP (last 3 results) No results for input(s): PROBNP in the last 8760 hours. HbA1C: Recent Labs    12/03/2020 0603  HGBA1C 7.6*   CBG: Recent Labs  Lab 12/21/20 0120 12/21/20 0316 12/21/20 0523 12/21/20 0727 12/21/20 1201  GLUCAP 258* 170* 157* 127* 115*   Lipid Profile: Recent Labs    12/07/2020 0603  CHOL 87  HDL 38*  LDLCALC 35  TRIG 71  CHOLHDL 2.3   Thyroid Function Tests: No results for input(s): TSH, T4TOTAL, FREET4, T3FREE, THYROIDAB in the last 72 hours. Anemia Panel: No results for input(s): VITAMINB12, FOLATE, FERRITIN, TIBC, IRON, RETICCTPCT in the  last 72 hours. Sepsis Labs: Recent Labs  Lab 12/14/2020 2051 12/19/20 1022 12/19/20 1230  12/19/20 1540  LATICACIDVEN 1.9 1.3 1.5 1.7    Recent Results (from the past 240 hour(s))  Culture, blood (Routine x 2)     Status: None (Preliminary result)   Collection Time: 11/28/2020  4:10 PM   Specimen: BLOOD  Result Value Ref Range Status   Specimen Description BLOOD LEFT ANTECUBITAL  Final   Special Requests   Final    BOTTLES DRAWN AEROBIC AND ANAEROBIC Blood Culture adequate volume   Culture   Final    NO GROWTH 3 DAYS Performed at [facility], [address], [place], [place] [address]    Report Status PENDING  Incomplete  SARS CORONAVIRUS 2 (TAT 6-24 HRS) Nasopharyngeal Nasopharyngeal Swab     Status: None   Collection Time: 12/24/2020  8:51 PM   Specimen: Nasopharyngeal Swab  Result Value Ref Range Status   SARS Coronavirus 2 NEGATIVE NEGATIVE Final    Comment: (NOTE) SARS-CoV-2 target nucleic acids are NOT DETECTED.  The SARS-CoV-2 RNA is generally detectable in upper and lower respiratory specimens during the acute phase of infection. Negative results do not preclude SARS-CoV-2 infection, do not rule out co-infections with other pathogens, and should not be used as the sole basis for treatment or other patient management decisions. Negative results must be combined with clinical observations, patient history, and epidemiological information. The expected result is Negative.  Fact Sheet for Patients: SugarRoll.be  Fact Sheet for Healthcare Providers: https://www.woods-mathews.com/  This test is not yet approved or cleared by the [place] FDA and  has been authorized for detection and/or diagnosis of SARS-CoV-2 by FDA under an Emergency Use Authorization (EUA). This EUA will remain  in effect (meaning this test can be used) for the duration of the COVID-19 declaration under Se ction 564(b)(1) of the Act, 21  U.S.C. section 360bbb-3(b)(1), unless the authorization is terminated or revoked sooner.  Performed at [facility], [place] [address], [place], [place] [address]   Culture, blood (Routine x 2)     Status: Abnormal   Collection Time: 12/04/2020  9:00 PM   Specimen: BLOOD  Result Value Ref Range Status   Specimen Description   Final    BLOOD BLOOD RIGHT FOREARM Performed at [facility], [address] [place], [place] [address]    Special Requests   Final    BOTTLES DRAWN AEROBIC AND ANAEROBIC Blood Culture adequate volume Performed at [facility], [address] [place], [place] [address]    Culture  Setup Time   Final    GRAM POSITIVE COCCI AEROBIC BOTTLE ONLY CRITICAL RESULT CALLED TO, READ BACK BY AND VERIFIED WITH: NATHAN BELUE _0  ON 12/25/2020 SKL    Culture (A)  Final    STAPHYLOCOCCUS EPIDERMIDIS THE SIGNIFICANCE OF ISOLATING THIS ORGANISM FROM A SINGLE SET OF BLOOD CULTURES WHEN MULTIPLE SETS ARE DRAWN IS UNCERTAIN. PLEASE NOTIFY THE MICROBIOLOGY DEPARTMENT WITHIN ONE WEEK IF SPECIATION AND SENSITIVITIES ARE REQUIRED. Performed at [facility], [place] [address] [place], [place] [address]    Report Status 12/21/2020 FINAL  Final  Blood Culture ID Panel (Reflexed)     Status: Abnormal   Collection Time: 12/17/2020  9:00 PM  Result Value Ref Range Status   Enterococcus faecalis NOT DETECTED NOT DETECTED Final   Enterococcus Faecium NOT DETECTED NOT DETECTED Final   Listeria monocytogenes NOT DETECTED NOT DETECTED Final   Staphylococcus species DETECTED (A) NOT DETECTED Final    Comment: CRITICAL RESULT CALLED TO, READ BACK BY AND  VERIFIED WITH: NATHAN BELUE _0  ON 12/19/2020 SKL    Staphylococcus aureus (BCID) NOT DETECTED NOT DETECTED Final   Staphylococcus epidermidis DETECTED (A) NOT DETECTED Final    Comment: Methicillin (oxacillin) resistant coagulase negative staphylococcus. Possible blood culture contaminant (unless isolated from more  than one blood culture draw or clinical case suggests pathogenicity). No antibiotic treatment is indicated for blood  culture contaminants. CRITICAL RESULT CALLED TO, READ BACK BY AND VERIFIED WITH: NATHAN BELUE _1  ON 12/15/2020 SKL    Staphylococcus lugdunensis NOT DETECTED NOT DETECTED Final   Streptococcus species NOT DETECTED NOT DETECTED Final   Streptococcus agalactiae NOT DETECTED NOT DETECTED Final   Streptococcus pneumoniae NOT DETECTED NOT DETECTED Final   Streptococcus pyogenes NOT DETECTED NOT DETECTED Final   A.calcoaceticus-baumannii NOT DETECTED NOT DETECTED Final   Bacteroides fragilis NOT DETECTED NOT DETECTED Final   Enterobacterales NOT DETECTED NOT DETECTED Final   Enterobacter cloacae complex NOT DETECTED NOT DETECTED Final   Escherichia coli NOT DETECTED NOT DETECTED Final   Klebsiella aerogenes NOT DETECTED NOT DETECTED Final   Klebsiella oxytoca NOT DETECTED NOT DETECTED Final   Klebsiella pneumoniae NOT DETECTED NOT DETECTED Final   Proteus species NOT DETECTED NOT DETECTED Final   Salmonella species NOT DETECTED NOT DETECTED Final   Serratia marcescens NOT DETECTED NOT DETECTED Final   Haemophilus influenzae NOT DETECTED NOT DETECTED Final   Neisseria meningitidis NOT DETECTED NOT DETECTED Final   Pseudomonas aeruginosa NOT DETECTED NOT DETECTED Final   Stenotrophomonas maltophilia NOT DETECTED NOT DETECTED Final   Candida albicans NOT DETECTED NOT DETECTED Final   Candida auris NOT DETECTED NOT DETECTED Final   Candida glabrata NOT DETECTED NOT DETECTED Final   Candida krusei NOT DETECTED NOT DETECTED Final   Candida parapsilosis NOT DETECTED NOT DETECTED Final   Candida tropicalis NOT DETECTED NOT DETECTED Final   Cryptococcus neoformans/gattii NOT DETECTED NOT DETECTED Final   Methicillin resistance mecA/C DETECTED (A) NOT DETECTED Final    Comment: CRITICAL RESULT CALLED TO, READ BACK BY AND VERIFIED WITH: NATHAN BELUE _2  ON 12/03/2020 SKL Performed at  [facility], [address] [place], [place] [address]   MRSA PCR Screening     Status: None   Collection Time: 12/19/20 11:13 PM   Specimen: Nasal Mucosa; Nasopharyngeal  Result Value Ref Range Status   MRSA by PCR NEGATIVE NEGATIVE Final    Comment:        The GeneXpert MRSA Assay (FDA approved for NASAL specimens only), is one component of a comprehensive MRSA colonization surveillance program. It is not intended to diagnose MRSA infection nor to guide or monitor treatment for MRSA infections. Performed at [facility], [address] [place], [place] [address]       Imaging Studies   PERIPHERAL VASCULAR CATHETERIZATION  Result Date: 12/04/2020 See op note    Medications   Scheduled Meds: . aspirin EC  81 mg Oral Daily  . budesonide  0.25 mg Nebulization BID  . calcitRIOL  0.5 mcg Oral Daily  . cholecalciferol  1,000 Units Oral QHS  . enoxaparin (LOVENOX) injection  30 mg Subcutaneous Q24H  . ferrous sulfate  325 mg Oral Q breakfast  . gabapentin  300 mg Oral Daily  . gabapentin  600 mg Oral QHS  . insulin aspart  0-6 Units Subcutaneous Q4H  . loratadine  10 mg Oral BID  . pantoprazole  40 mg Oral BID  . pravastatin  80 mg Oral q1800  . Treprostinil  18 mcg Inhalation QID   Continuous Infusions: . vancomycin 1,000 mg (12/19/2020 2120)       LOS: 3 days    Time spent: 30 minutes with >50% spent in coordination of care and at bedside    Ezekiel Slocumb, DO [facility]  12/21/2020, 3:24 PM      If 7PM-7AM, please contact night-coverage. How to contact the [facility] Attending or Consulting provider [place] or covering provider during after hours [place], for this patient?    1. Check the care team in [facility] and look for a) attending/consulting TRH provider listed and b) the [facility] team listed 2. Log into [email or web address] and use [place]'s universal password to access. If you do not have the password, please contact the hospital  operator. 3. Locate the [facility] provider you are looking for under [facility] and page to a number that you can be directly reached. 4. If you still have difficulty reaching the provider, please page the [facility] (Director on Call) for the Hospitalists listed on amion for assistance.

## 2020-12-21 NOTE — TOC Initial Note (Signed)
Transition of Care [facility]) - Initial/Assessment Note    Patient Details  Name: Lindsay Harmon MRN: [id number] Date of Birth: Jul 15, 1956  Transition of Care [facility]) CM/SW Contact:    Shelbie Ammons, RN Phone Number: 12/21/2020, 10:55 AM  Clinical Narrative:   RNCM met with patient in room. Patient alert and verbally responsive and reports to feeling somewhat better today. Patient reports that she is currently staying with her mother and that they provide assistance to each other. Patient reports that she normally drives but that her family will assist her until she is able again. She reports no problems obtaining her medications and she is up to date with her PCP, Dr. Silvio Pate. Patient reports she has had home health in the past and would be receptive to it again but does not really think she needs it. TOC will follow for needs                      Patient Goals and CMS Choice        Expected Discharge Plan and Services                                                Prior Living Arrangements/Services                       Activities of Daily Living Home Assistive Devices/Equipment: Gilford Rile (specify type) (front wheel) ADL Screening (condition at time of admission) Patient's cognitive ability adequate to safely complete daily activities?: Yes Is the patient deaf or have difficulty hearing?: No Does the patient have difficulty seeing, even when wearing glasses/contacts?: No Does the patient have difficulty concentrating, remembering, or making decisions?: No Patient able to express need for assistance with ADLs?: Yes Does the patient have difficulty dressing or bathing?: No Independently performs ADLs?: Yes (appropriate for developmental age) Does the patient have difficulty walking or climbing stairs?: Yes Weakness of Legs: Right Weakness of Arms/Hands: None  Permission Sought/Granted                  Emotional Assessment              Admission  diagnosis:  Cellulitis [L03.90] Cellulitis of both lower extremities [L03.115, L03.116] Patient Active Problem List   Diagnosis Date Noted  . Cellulitis of left leg 12/19/2020  . Uncontrolled type 2 diabetes mellitus with hyperglycemia ([place]) 12/19/2020  . Diabetic polyneuropathy ([place]) 12/19/2020  . Chronic diastolic CHF (congestive heart failure) ([place]) 12/19/2020  . COPD (chronic obstructive pulmonary disease) with emphysema ([place]) 12/19/2020  . Chronic pain syndrome 12/19/2020  . Cellulitis and abscess of foot, except toes 12/13/2020  . Recurrent cellulitis 12/04/2020  . Cellulitis 12/25/2020  . Hypoglycemia associated with diabetes ([place]) 12/25/2020  . Cellulitis of left lower leg 11/25/2020  . Cellulitis of right leg 11/24/2020  . Bursitis of left elbow 05/05/2020  . Right hip pain 05/05/2020  . Pre-ulcerative calluses 04/10/2020  . Pain due to onychomycosis of toenails of both feet 01/13/2020  . Porokeratosis 01/13/2020  . Erythrocytosis 12/13/2019  . Senile purpura ([place]) 09/27/2019  . Foot pain, bilateral 03/03/2019  . Aortic atherosclerosis ([place])   . Narcotic dependence ([place]) 07/23/2017  . Encounter for chronic pain management 03/21/2017  . Cor pulmonale, chronic ([place])   . Pulmonary hypertension ([place])   . RVF (  right ventricular failure) ([place]) 01/17/2015  . Chronic respiratory failure with hypoxia ([place]) 12/15/2014  . COPD (chronic obstructive pulmonary disease) ([place]) 12/15/2014  . Chronic diastolic heart failure ([place]) 12/05/2014  . Chronic renal disease, stage IV ([place]) 12/01/2014  . Advance directive discussed with patient 11/08/2014  . Diabetes, polyneuropathy ([place]) 09/15/2013  . Routine general medical examination at a health care facility 08/13/2011  . CAD (coronary artery disease) 02/27/2007  . Chronic back pain 02/27/2007  . Type 2 diabetes mellitus with polyneuropathy, controlled ([place]) 02/26/2007  . Episodic mood disorder ([place]) 02/26/2007  . Essential hypertension,  benign 02/26/2007  . PVD (peripheral vascular disease) ([place]) 02/26/2007  . GERD 02/26/2007  . DEGENERATIVE JOINT DISEASE 02/26/2007   PCP:  Venia Carbon, MD Pharmacy:   [facility], [place] [address] [place] [address] Phone: [phone] Fax: [place], [place] - [place] [address] [place] [address] Phone: [phone] Fax: [phone]     Social Determinants of Health (SDOH) Interventions    Readmission Risk Interventions Readmission Risk Prevention Plan 12/21/2020  Transportation Screening Complete  Medication Review ([place]) Complete  PCP or Specialist appointment within 3-5 days of discharge Complete  SW Recovery Care/Counseling Consult Complete  [place] Not Applicable  Some recent data might be hidden

## 2020-12-21 NOTE — Progress Notes (Signed)
[facility] Daily Progress Note  Subjective: 12/24/2020: 1. Ultrasound guidance for vascular access right femoral artery 2. Catheter placement into left common femoral artery from right femoral approach 3. Aortogram and selective bilateral lower extremity angiograms 4. Percutaneous transluminal angioplasty of left posterior tibial artery with 3 mm diameter angioplasty balloon 5.  Percutaneous transluminal angioplasty of left SFA and was proximal popliteal artery with 4 mm diameter Lutonix drug-coated angioplasty balloon             6.  Viabahn stent placement left SFA with 6 mm diameter by 25 cm length stent for residual stenosis after angioplasty 7. StarClose closure device right femoral artery  Patient without complaint this p.m.  No issues overnight.  Objective: Vitals:   12/21/20 0320 12/21/20 0726 12/21/20 1201 12/21/20 1600  BP: 131/83 (!) 148/87 (!) 142/82 (!) 146/89  Pulse: 100 (!) 109 (!) 107 (!) 110  Resp: _0 Temp: 98.4 F (36.9 C) 98.2 F (36.8 C) 98.2 F (36.8 C) 97.7 F (36.5 C)  TempSrc: Oral     SpO2: 93% 97% 97% 96%  Weight:      Height:        Intake/Output Summary (Last 24 hours) at 12/21/2020 1615 Last data filed at 12/21/2020 1012 Gross per 24 hour  Intake 240 ml  Output 500 ml  Net -260 ml   Physical Exam: A&Ox3, NAD CV: RRR Pulmonary: CTA Bilaterally Abdomen: Soft, Non-tender, Non-distended Access site: Clean dry and intact Vascular:  Left lower extremity: Thigh soft. Calf soft. Extremities warm distally to toes. Some improvement to erythema.  Motor/sensory is intact. There is no acute vascular compromise noted to extremity at this time.   Laboratory: CBC    Component Value Date/Time   WBC 9.5 12/21/2020 0636   HGB 14.2 12/21/2020 0636   HCT 45.2 12/21/2020 0636   PLT 190 12/21/2020 0636   BMET    Component Value Date/Time   NA 140  12/21/2020 0636   K 3.8 12/21/2020 0636   CL 96 (L) 12/21/2020 0636   CO2 32 12/21/2020 0636   GLUCOSE 140 (H) 12/21/2020 0636   BUN 27 (H) 12/21/2020 0636   CREATININE 1.70 (H) 12/21/2020 0636   CALCIUM 9.7 12/21/2020 0636   GFRNONAA 33 (L) 12/21/2020 0636   GFRAA 23 (L) 04/18/2020 1219   Assessment/Planning: The patient is a 65 year old female with known history of peripheral artery disease status post left lower extremity angiogram  1) successful left lower extremity angiogram with intervention 2) during yesterday's angiogram, severe atherosclerotic disease was noted to the right lower extremity arterial system.  Unfortunately, this was not treatable endovascularly.  The patient will need a right femoral endarterectomy which we were able to schedule for Monday.  3) procedure, risks and benefits were explained to the patient.  All questions were answered.  The patient wishes to proceed with a femoral endarterectomy on Monday  Discussed with Dr. Ellis Parents [facility] PA-C 12/21/2020 4:15 PM

## 2020-12-21 NOTE — Progress Notes (Signed)
Inpatient Diabetes Program Recommendations  AACE/ADA: New Consensus Statement on Inpatient Glycemic Control (2015)  Target Ranges:  Prepandial:   less than 140 mg/dL      Peak postprandial:   less than 180 mg/dL (1-2 hours)      Critically ill patients:  140 - 180 mg/dL   Lab Results  Component Value Date   GLUCAP 127 (H) 12/21/2020   HGBA1C 7.6 (H) 12/12/2020    Review of Glycemic Control Results for TAHIRIH, LAIR (MRN [id number]) as of 12/21/2020 11:12  Ref. Range 12/14/2020 22:18 12/21/2020 01:20 12/21/2020 03:16 12/21/2020 05:23 12/21/2020 07:27  Glucose-Capillary Latest Ref Range: 70 - 99 mg/dL 362 (H) 258 (H) 170 (H) 157 (H) 127 (H)   Diabetes history: DM2 Outpatient Diabetes medications: Lantus 13 units daily Current orders for Inpatient glycemic control:  Novolog very sensitive q 4 hours  Inpatient Diabetes Program Recommendations:    Consider adding Lantus 5 units daily.  Please reduce Novolog correction 0-6 units to tid with meals and HS.    Thanks,  Adah Perl, RN, BC-ADM Inpatient Diabetes Coordinator Pager [phone] (8a-5p)

## 2020-12-21 NOTE — Evaluation (Signed)
Occupational Therapy Evaluation Patient Details Name: Lindsay Harmon MRN: [id number] DOB: Jun 09, 1956 Today's Date: 12/21/2020    History of Present Illness Lindsay Harmon is a 65 y.o. female with medical history significant for Chronic diastolic heart failure, CAD, CKD 4, COPD on home O2 at 4 L, HTN, DM2 with polyneuropathy and PVD, hospitalized for right lower extremity cellulitis from 1/28-2/1 who was sent in by her podiatrist for IV antibiotics and vascular due to recurrent cellulitis, this time of her left lower extremity   Clinical Impression   Patient presenting with decreased I in self care, balance, functional mobility/transfer, endurance and safety awareness. Patient reports living with mother at mod I level with use of rollator PTA. Pt takes sink baths and does use shower chair on occasional for bathing tasks. Family assists with IADL tasks as needed and live nearby.Patient currently functioning with supervision with use of rollator. Pt does report 4/10 soreness in L LE but tolerates standing grooming tasks this session. Pt on 4 L O2 [place] at baseline. Pt returns to bed at end of session. OT educated pt on energy conservation techniques for home with self care tasks. Education to continue. Patient will benefit from acute OT to increase overall independence in the areas of ADLs, functional mobility, and safety awareness in order to safely discharge to next venue of care.    Follow Up Recommendations  No OT follow up    Equipment Recommendations  None recommended by OT       Precautions / Restrictions Precautions Precautions: Fall Precaution Comments: mod fall Restrictions Weight Bearing Restrictions: No      Mobility Bed Mobility Overal bed mobility: Modified Independent             General bed mobility comments: safe technique    Transfers Overall transfer level: Needs assistance Equipment used: 4-wheeled walker Transfers: Sit to/from [facility] Sit  to Stand: Supervision Stand pivot transfers: Supervision       General transfer comment: S for safety. All mobility performed on 4L of O2    Balance Overall balance assessment: Mild deficits observed, not formally tested                                         ADL either performed or assessed with clinical judgement   ADL Overall ADL's : Needs assistance/impaired     Grooming: Wash/dry hands;Wash/dry face;Supervision/safety;Standing                   Toilet Transfer: Supervision/safety;Ambulation Toilet Transfer Details (indicate cue type and reason): rollator         Functional mobility during ADLs: Supervision/safety General ADL Comments: use of rollator with functional mobility     Vision Patient Visual Report: No change from baseline              Pertinent Vitals/Pain Pain Assessment: 0-10 Pain Score: 4  Pain Location: L leg and low back Pain Descriptors / Indicators: Discomfort;Sore Pain Intervention(s): Limited activity within patient's tolerance;Monitored during session;Repositioned     Hand Dominance Right   Extremity/Trunk Assessment Upper Extremity Assessment Upper Extremity Assessment: Generalized weakness   Lower Extremity Assessment Lower Extremity Assessment: Generalized weakness   Cervical / Trunk Assessment Cervical / Trunk Assessment: Normal   Communication Communication Communication: No difficulties   Cognition Arousal/Alertness: Awake/alert Behavior During Therapy: WFL for tasks assessed/performed Overall Cognitive Status: Within Functional Limits  for tasks assessed                                        Exercises Total Joint Exercises Ankle Circles/Pumps: AROM;Both;10 reps        Home Living Family/patient expects to be discharged to:: Private residence Living Arrangements: Parent Available Help at Discharge: Family;Available 24 hours/day Type of Home: House Home Access: Ramped  entrance     Home Layout: One level     Bathroom Shower/Tub: Tub/shower unit         Home Equipment: Walker - 4 wheels;Hand held shower head   Additional Comments: been living with her mother. Sister helps and has been buying groceries for them. Other family available as well.      Prior Functioning/Environment Level of Independence: Independent with assistive device(s)        Comments: MOD I using 4WW and 4L O2 at baseline        OT Problem List: Decreased strength;Decreased activity tolerance;Impaired balance (sitting and/or standing);Decreased safety awareness      OT Treatment/Interventions: Self-care/ADL training;Therapeutic exercise;Energy conservation;DME and/or AE instruction;Therapeutic activities;Balance training;Patient/family education;Manual therapy    OT Goals(Current goals can be found in the care plan section) Acute Rehab OT Goals Patient Stated Goal: To return home OT Goal Formulation: With patient Time For Goal Achievement: 01/04/21 Potential to Achieve Goals: Good ADL Goals Pt Will Perform Grooming: with modified independence;standing Pt Will Perform Lower Body Dressing: with modified independence;sit to/from stand Pt Will Transfer to Toilet: with modified independence;ambulating Pt Will Perform Toileting - Clothing Manipulation and hygiene: with modified independence;sit to/from stand  OT Frequency: Min 2X/week   Barriers to D/C:    none known at this time          AM-PAC OT "6 Clicks" Daily Activity     Outcome Measure Help from another person eating meals?: None Help from another person taking care of personal grooming?: None Help from another person toileting, which includes using toliet, bedpan, or urinal?: None Help from another person bathing (including washing, rinsing, drying)?: A Little Help from another person to put on and taking off regular upper body clothing?: None Help from another person to put on and taking off regular lower  body clothing?: A Little 6 Click Score: 22   End of Session Equipment Utilized During Treatment: Rolling walker;Oxygen Nurse Communication: Mobility status  Activity Tolerance: Patient tolerated treatment well Patient left: in bed;with call bell/phone within reach  OT Visit Diagnosis: Muscle weakness (generalized) (M62.81)                Time: 6387-5643 OT Time Calculation (min): 25 min Charges:  OT General Charges $OT Visit: 1 Visit OT Evaluation $OT Eval Low Complexity: 1 Low OT Treatments $Self Care/Home Management : 8-22 mins  Darleen Crocker, MS, OTR/L , CBIS ascom [phone]  12/21/20, 12:23 PM

## 2020-12-21 NOTE — Evaluation (Signed)
Physical Therapy Evaluation Patient Details Name: Lindsay Harmon MRN: [id number] DOB: [birth year] Today's Date: 12/21/2020   History of Present Illness  Lindsay Harmon is a 65 y.o. female with medical history significant for Chronic diastolic heart failure, CAD, CKD 4, COPD on home O2 at 4 L, HTN, DM2 with polyneuropathy and PVD, hospitalized for right lower extremity cellulitis from 1/28-2/1 who was sent in by her podiatrist for IV antibiotics and vascular due to recurrent cellulitis, this time of her left lower extremity  Clinical Impression  Patient received in bed, she reports she has been up to bathroom and leg did not hurt as much as she anticipated. Patient pleasant and cooperative. HR elevated at rest. Patient is mod independent with bed mobility and transfers. Requires min guard for ambulation with rollator. She declines hallway ambulation at this time, but should be able to tolerate without difficulty. Patient will continue to benefit from skilled PT while here to improve strength, functional independence and safety with mobility.        Follow Up Recommendations No PT follow up    Equipment Recommendations  None recommended by PT    Recommendations for Other Services       Precautions / Restrictions Precautions Precautions: Fall Precaution Comments: mod fall Restrictions Weight Bearing Restrictions: No      Mobility  Bed Mobility Overal bed mobility: Modified Independent             General bed mobility comments: safe technique    Transfers Overall transfer level: Modified independent Equipment used: 4-wheeled walker             General transfer comment: safe technique. All mobility performed on 4L of O2  Ambulation/Gait Ambulation/Gait assistance: Supervision Gait Distance (Feet): 4 Feet Assistive device: 4-wheeled walker Gait Pattern/deviations: Step-through pattern;Decreased step length - right;Decreased step length - left Gait velocity: decr    General Gait Details: elevated HR this am, patient only willing to get up to recliner at this time. HR at rest 118, up to 130 with mobility.  Stairs            Wheelchair Mobility    Modified Rankin (Stroke Patients Only)       Balance Overall balance assessment: Mild deficits observed, not formally tested                                           Pertinent Vitals/Pain Pain Assessment: 0-10 Pain Score: 6  Pain Location: L leg and low back Pain Descriptors / Indicators: Discomfort;Sore Pain Intervention(s): Limited activity within patient's tolerance;Monitored during session;Repositioned    Home Living Family/patient expects to be discharged to:: Private residence Living Arrangements: Parent Available Help at Discharge: Family;Available 24 hours/day Type of Home: House Home Access: Ramped entrance     Home Layout: One level Home Equipment: Walker - 4 wheels;Hand held shower head Additional Comments: been living with her mother. Sister helps and has been buying groceries for them    Prior Function Level of Independence: Independent with assistive device(s)         Comments: MOD I using 4WW and 4L O2 at baseline     Hand Dominance        Extremity/Trunk Assessment   Upper Extremity Assessment Upper Extremity Assessment: Generalized weakness    Lower Extremity Assessment Lower Extremity Assessment: Generalized weakness    Cervical / Trunk Assessment Cervical /  Trunk Assessment: Normal  Communication   Communication: No difficulties  Cognition Arousal/Alertness: Awake/alert Behavior During Therapy: WFL for tasks assessed/performed Overall Cognitive Status: Within Functional Limits for tasks assessed                                        General Comments      Exercises Total Joint Exercises Ankle Circles/Pumps: AROM;Both;10 reps   Assessment/Plan    PT Assessment Patient needs continued PT services  PT  Problem List Decreased strength;Decreased mobility;Decreased activity tolerance;Pain       PT Treatment Interventions Therapeutic activities;Therapeutic exercise;Gait training;Functional mobility training;Patient/family education    PT Goals (Current goals can be found in the Care Plan section)  Acute Rehab PT Goals Patient Stated Goal: To return home PT Goal Formulation: With patient Time For Goal Achievement: 01/05/21 Potential to Achieve Goals: Good    Frequency Min 2X/week   Barriers to discharge        Co-evaluation               AM-PAC PT "6 Clicks" Mobility  Outcome Measure Help needed turning from your back to your side while in a flat bed without using bedrails?: None Help needed moving from lying on your back to sitting on the side of a flat bed without using bedrails?: None Help needed moving to and from a bed to a chair (including a wheelchair)?: A Little Help needed standing up from a chair using your arms (e.g., wheelchair or bedside chair)?: None Help needed to walk in hospital room?: A Little Help needed climbing 3-5 steps with a railing? : A Little 6 Click Score: 21    End of Session Equipment Utilized During Treatment: Oxygen Activity Tolerance: Patient tolerated treatment well Patient left: in chair;with call bell/phone within reach Nurse Communication: Mobility status PT Visit Diagnosis: Muscle weakness (generalized) (M62.81);Pain Pain - Right/Left: Left Pain - part of body: Leg    Time: 1045-1105 PT Time Calculation (min) (ACUTE ONLY): 20 min   Charges:   PT Evaluation $PT Eval Moderate Complexity: 1 Mod          Kristyn Conetta, PT, GCS 12/21/20,11:17 AM

## 2020-12-21 NOTE — Consult Note (Signed)
Pharmacy Antibiotic Note  Lindsay Harmon is a 65 y.o. female with medical history including diabetes c/b neuropathy, PVD, CKD admitted on 12/11/2020 with cellulitis.    Pharmacy has been consulted for vancomycin dosing.  Scr improving 2.02>>1.94>1.70 (optimized with actual baseline ~ 2)  Plan: Vancomycin 1500 mg IV LD x 1 followed by maintenance regimen of 1000 mg IV q48h  - 1st follow up dose 12/07/2020 2120 1055m x 1  --Predicted AUC: 422, Cmin 9.5 (from Scr 1.7 - but I believe this is somewhat optimized) --Levels at steady state as indicated (3rd-4th maintenance dose) if warranted --Daily Scr per protocol while on vancomycin  Height: _0  (154.9 cm) Weight: 67.1 kg (148 lb) IBW/kg (Calculated) : 47.8  Temp (24hrs), Avg:98.2 F (36.8 C), Min:98.1 F (36.7 C), Max:98.4 F (36.9 C)  Recent Labs  Lab 12/03/2020 1604 12/14/2020 2051 12/19/20 0017 12/19/20 0604 12/19/20 1022 12/19/20 1230 12/19/20 1540 12/22/2020 0603 12/21/20 0636  WBC 9.2  --  7.7 11.6*  --   --   --  6.8 9.5  CREATININE 2.02*  --  1.97* 2.07*  --   --   --  1.94* 1.70*  LATICACIDVEN 1.7 1.9  --   --  1.3 1.5 1.7  --   --     Estimated Creatinine Clearance: 28.1 mL/min (A) (by C-G formula based on SCr of 1.7 mg/dL (H)).    Allergies  Allergen Reactions  . Sertraline Hcl Other (See Comments)    Altered Mental Status    Antimicrobials this admission: Zosyn 2/21 x 1 Vancomycin 2/21 >>   Dose adjustments this admission: N/A  Microbiology results: 2/21 BCx: pending  Thank you for allowing pharmacy to be a part of this patient's care.  CLu Duffel PharmD, BCPS Clinical Pharmacist 12/21/2020 9:51 AM

## 2020-12-22 DIAGNOSIS — L03116 Cellulitis of left lower limb: Secondary | ICD-10-CM | POA: Diagnosis not present

## 2020-12-22 LAB — PHOSPHORUS: Phosphorus: 2.9 mg/dL (ref 2.5–4.6)

## 2020-12-22 LAB — CREATININE, SERUM
Creatinine, Ser: 1.56 mg/dL — ABNORMAL HIGH (ref 0.44–1.00)
GFR, Estimated: 36 mL/min — ABNORMAL LOW (ref >60)

## 2020-12-22 LAB — CBC WITH DIFFERENTIAL/PLATELET
Abs Immature Granulocytes: 0.05 10*3/uL (ref 0.00–0.07)
Basophils Absolute: 0.1 10*3/uL (ref 0.0–0.1)
Basophils Relative: 1 %
Eosinophils Absolute: 0.1 10*3/uL (ref 0.0–0.5)
Eosinophils Relative: 1 %
HCT: 45.6 % (ref 36.0–46.0)
Hemoglobin: 14.8 g/dL (ref 12.0–15.0)
Immature Granulocytes: 1 %
Lymphocytes Relative: 13 %
Lymphs Abs: 1.3 10*3/uL (ref 0.7–4.0)
MCH: 30.1 pg (ref 26.0–34.0)
MCHC: 32.5 g/dL (ref 30.0–36.0)
MCV: 92.7 fL (ref 80.0–100.0)
Monocytes Absolute: 0.8 10*3/uL (ref 0.1–1.0)
Monocytes Relative: 8 %
Neutro Abs: 8 10*3/uL — ABNORMAL HIGH (ref 1.7–7.7)
Neutrophils Relative %: 76 %
Platelets: 167 10*3/uL (ref 150–400)
RBC: 4.92 MIL/uL (ref 3.87–5.11)
RDW: 15.7 % — ABNORMAL HIGH (ref 11.5–15.5)
WBC: 10.2 10*3/uL (ref 4.0–10.5)
nRBC: 0 % (ref 0.0–0.2)

## 2020-12-22 LAB — GLUCOSE, CAPILLARY
Glucose-Capillary: 153 mg/dL — ABNORMAL HIGH (ref 70–99)
Glucose-Capillary: 141 mg/dL — ABNORMAL HIGH (ref 70–99)
Glucose-Capillary: 162 mg/dL — ABNORMAL HIGH (ref 70–99)
Glucose-Capillary: 168 mg/dL — ABNORMAL HIGH (ref 70–99)
Glucose-Capillary: 183 mg/dL — ABNORMAL HIGH (ref 70–99)

## 2020-12-22 LAB — MAGNESIUM: Magnesium: 1.7 mg/dL (ref 1.7–2.4)

## 2020-12-22 MED ORDER — ENOXAPARIN SODIUM 40 MG/0.4ML ~~LOC~~ SOLN
40.0000 mg | SUBCUTANEOUS | Status: DC
  Administered 2020-12-22: 40 mg via SUBCUTANEOUS
  Filled 2020-12-22: qty 0.4

## 2020-12-22 MED ORDER — METOPROLOL TARTRATE 25 MG PO TABS
12.5000 mg | ORAL_TABLET | Freq: Two times a day (BID) | ORAL | Status: DC
  Administered 2020-12-22 (×2): 12.5 mg via ORAL
  Filled 2020-12-22 (×2): qty 1

## 2020-12-22 MED ORDER — LINEZOLID 600 MG PO TABS
600.0000 mg | ORAL_TABLET | Freq: Two times a day (BID) | ORAL | Status: DC
  Administered 2020-12-22 – 2020-12-23 (×3): 600 mg via ORAL
  Filled 2020-12-22 (×4): qty 1

## 2020-12-22 NOTE — Progress Notes (Signed)
   12/22/20 0833  Assess: MEWS Score  Pulse Rate (!) 120  Resp 18  SpO2 93 %  O2 Device Nasal Cannula  O2 Flow Rate (L/min) 4 L/min  Assess: MEWS Score  MEWS Temp 0  MEWS Systolic 0  MEWS Pulse 2  MEWS RR 0  MEWS LOC 0  MEWS Score 2  MEWS Score Color Yellow  Take Vital Signs  Increase Vital Sign Frequency  Yellow: Q 2hr X 2 then Q 4hr X 2, if remains yellow, continue Q 4hrs  Notify: Charge Nurse/RN  Name of Charge Nurse/RN Notified Gerald Stabs  Date Charge Nurse/RN Notified 12/22/20  Time Charge Nurse/RN Notified 0845  Notify: Provider  Provider Name/Title Dr. Arbutus Ped, K  Date Provider Notified 12/22/20  Time Provider Notified 0900  Notification Type Page  Provider response See new orders  Date of Provider Response 12/22/20  Time of Provider Response [phone]

## 2020-12-22 NOTE — Progress Notes (Signed)
Physical Therapy Treatment Patient Details Name: Lindsay Harmon MRN: [id number] DOB: 1956/03/20 Today's Date: 12/22/2020    History of Present Illness Lindsay Harmon is a 65 y.o. female with medical history significant for Chronic diastolic heart failure, CAD, CKD 4, COPD on home O2 at 4 L, HTN, DM2 with polyneuropathy and PVD, hospitalized for right lower extremity cellulitis from 1/28-2/1 who was sent in by her podiatrist for IV antibiotics and vascular due to recurrent cellulitis, this time of her left lower extremity    PT Comments    Patient received in bed, agrees to PT session. Reports she slept well. Reports no pain. She performed bed mobility with mod independence, transfers with mod independence and ambulated 150 feet with rollator on 4 lpm O2. Patient required 2 seated rest breaks during ambulation due to sob, fatigue and elevated HR. Patient will continue to benefit from skilled PT while here to improve functional independence and activity tolerance for safe return home.       Follow Up Recommendations  No PT follow up     Equipment Recommendations  None recommended by PT    Recommendations for Other Services       Precautions / Restrictions Precautions Precautions: Fall Precaution Comments: mod fall Restrictions Weight Bearing Restrictions: No    Mobility  Bed Mobility Overal bed mobility: Modified Independent             General bed mobility comments: safe technique    Transfers Overall transfer level: Modified independent Equipment used: 4-wheeled walker Transfers: Sit to/from Stand Sit to Stand: Modified independent (Device/Increase time)         General transfer comment: supervision for safety with transfers. Able to turn 180 degrees to sit on rollator to rest.  Ambulation/Gait Ambulation/Gait assistance: Supervision Gait Distance (Feet): 150 Feet Assistive device: 4-wheeled walker Gait Pattern/deviations: Step-through pattern;Decreased  stride length;Decreased step length - right;Decreased step length - left Gait velocity: decr   General Gait Details: HR continues to be elveated 122 at rest, up to 150 briefly with ambulation.   Stairs             Wheelchair Mobility    Modified Rankin (Stroke Patients Only)       Balance Overall balance assessment: Modified Independent                                          Cognition Arousal/Alertness: Awake/alert Behavior During Therapy: WFL for tasks assessed/performed Overall Cognitive Status: Within Functional Limits for tasks assessed                                        Exercises      General Comments        Pertinent Vitals/Pain Pain Assessment: No/denies pain    Home Living                      Prior Function            PT Goals (current goals can now be found in the care plan section) Acute Rehab PT Goals Patient Stated Goal: To return home PT Goal Formulation: With patient Time For Goal Achievement: 01/05/21 Potential to Achieve Goals: Good Progress towards PT goals: Progressing toward goals    Frequency  Min 2X/week      PT Plan Current plan remains appropriate    Co-evaluation              AM-PAC PT "6 Clicks" Mobility   Outcome Measure  Help needed turning from your back to your side while in a flat bed without using bedrails?: None Help needed moving from lying on your back to sitting on the side of a flat bed without using bedrails?: None Help needed moving to and from a bed to a chair (including a wheelchair)?: A Little Help needed standing up from a chair using your arms (e.g., wheelchair or bedside chair)?: None Help needed to walk in hospital room?: A Little Help needed climbing 3-5 steps with a railing? : A Little 6 Click Score: 21    End of Session Equipment Utilized During Treatment: Gait belt;Oxygen Activity Tolerance: Patient limited by fatigue Patient  left: in chair;with call bell/phone within reach Nurse Communication: Mobility status PT Visit Diagnosis: Muscle weakness (generalized) (M62.81);Pain     Time: 3174-0992 PT Time Calculation (min) (ACUTE ONLY): 30 min  Charges:  $Gait Training: 23-37 mins                     Kalle Bernath, PT, GCS 12/22/20,10:27 AM

## 2020-12-22 NOTE — Progress Notes (Signed)
PHARMACIST - PHYSICIAN COMMUNICATION  CONCERNING:  Enoxaparin (Lovenox) for DVT Prophylaxis    RECOMMENDATION: Patient was prescribed enoxaprin 15m q24 hours for VTE prophylaxis.   Filed Weights   12/13/2020 1602 12/19/2020 1459  Weight: 67.5 kg (148 lb 12.8 oz) 67.1 kg (148 lb)    Body mass index is 27.96 kg/m.  Estimated Creatinine Clearance: 30.7 mL/min (A) (by C-G formula based on SCr of 1.56 mg/dL (H)).   Patient is candidate for enoxaparin 464mevery 24 hours based on CrCl >3057min AND Weight >45kg  DESCRIPTION: [facility]  Patient is now receiving enoxaparin 40 mg every 24 hours    ChaLu DuffelharmD, BCPS Clinical Pharmacist 12/22/2020 8:57 AM

## 2020-12-22 NOTE — Progress Notes (Addendum)
PROGRESS NOTE    Lindsay Harmon   FHL:[id number]  DOB: 10-15-1956  PCP: Venia Carbon, MD    DOA: 12/02/2020 LOS: 5   Brief Narrative   65 y/o female with past medical history of chronic diastolic CHF, CAD, CKD stage 4, chronic respiratory failure with hypoxia due to severe pulmonary hypertension and COPD/emphysema on 4 L/min home O2, HTN, uncontrolled type 2 diabetes with polyneuropathy and PVD, recently hospitalized for right lower extremity cellulitis from 1/28-2/1.  She was sent to the hospital from podiatrist office for IV antibiotics and vascular surgery consultation due to recurrent cellulitis, this time of her left lower extremity.    Patient admitted with vascular surgery and podiatry consulted.  Cellulitis treated with IV Vancomycin.  Vascular surgery took patient for LLE angiogram and revascularization on 2/23.  During that procedure, severe atherosclerotic disease was noted to the right lower extremity arterial system that was not amenable to endovascular treatment.  Femoral endarterectomy planned for Monday 2/28.  Patient remained on antibiotics for cellulitis which showed marked clinical improvement.    Assessment & Plan   Principal Problem:   Cellulitis of left lower leg Active Problems:   Type 2 diabetes mellitus with polyneuropathy, controlled (HCC)   Essential hypertension, benign   CAD (coronary artery disease)   PVD (peripheral vascular disease) (HCC)   Chronic renal disease, stage IV (HCC)   Chronic diastolic heart failure (HCC)   Chronic respiratory failure with hypoxia (HCC)   COPD (chronic obstructive pulmonary disease) (HCC)   Recurrent cellulitis   Cellulitis   Hypoglycemia associated with diabetes ([place])   Cellulitis of left leg   Uncontrolled type 2 diabetes mellitus with hyperglycemia (HCC)   Diabetic polyneuropathy (HCC)   Chronic diastolic CHF (congestive heart failure) (HCC)   COPD (chronic obstructive pulmonary disease) with emphysema  (HCC)   Chronic pain syndrome   Cellulitis of the left lower extremity -with history of recurrent lower extremity cellulitis, with recent admission 1/28-2/1 for RLE cellulitis requiring IV antibiotics.   X-ray of the left foot was negative for signs of osteomyelitis. 2/23 -left lower extremity angiogram with revascularization --Vascular surgery and podiatry are consulted --Transition IV vancomycin >> PO linezolid --Off IV fluids currently, monitor clinically, encourage oral hydration --Pain control as needed --Elevate legs when in bed  Peripheral vascular disease -complicated by recurring cellulitis of bilateral lower extremities.   S/p LLE angiogram with balloon angioplasty and stent  --Plan for femoral endarterectomy Monday  Hypokalemia -replaced.  Monitor BMP and replace as needed  Hypoglycemia -in the setting of n.p.o. status.  Reduce sliding scale NovoLog to very sensitive (0-6 units) every 4 hours while n.p.o.  Diabetes coordinators following, recommendations appreciated. --Hypoglycemia protocol  Type 2 diabetes with hyperglycemia, polyneuropathy -hemoglobin A1c in January was 7.4%, uncontrolled. CBGs are currently at inpatient goal. --Sliding scale NovoLog --Consider addition of Lantus tomorrow if indicated --Diabetes coordinator following, recs appreciated --Continue gabapentin  Chronic diastolic CHF -patient appears euvolemic and currently well compensated.   --Monitor volume status while receiving IV fluids  Essential hypertension -with soft BPs, antihypertensives are held.  Coronary artery disease -stable with no active chest pain.  Continue aspirin and statin.  CKD stage IV -renal function is near baseline.  Continue vitamin D, Rocaltrol  Chronic respiratory failure with hypoxia due to COPD and Pulmonary HTN-  COPD not acutely exacerbated, no wheezing on exam.   Continue duo nebs, albuterol as needed, Claritin, Tyvaso.   Titrate oxygen to maintain O2 sat greater  than 88%.  (NOTE: Patient's Tyvaso was brought from home for use in the hospital.  Clinical pharmacist had requested RN to bring medication to the pharmacy however appears the machine was filled with medication before this occurred, therefore pharmacy is unable to appropriately verify the medication.  Holding the medication would be harmful to patient so we will continue it.)   Chronic pain syndrome -followed by pain management.  Continue outpatient methadone.  Morphine as needed.  Will discontinue Norco and monitor.  Generalized anxiety -continue as needed Xanax  DVT prophylaxis:    Diet:      Code Status: Prior    Subjective 12/25/20    Patient seen during lunch today.  Reports she feels well.  Walked the halls with therapy and did well, but did need to take a couple of rest breaks.  No F/C, SOB CP, N/V/D or other complaints.  Disposition Plan & Communication   Status is: Inpatient  Remains inpatient appropriate because:IV treatments appropriate due to intensity of illness or inability to take PO.  Vascular surgery planning further surgery on Monday.   Dispo: The patient is from: Home              Anticipated d/c is to: Home              Anticipated d/c date is: >3 days              Patient currently is not medically stable to d/c.   Difficult to place patient No   Family Communication: None at bedside, will attempt to call this afternoon   Consults, Procedures, Significant Events   Consultants:   Vascular surgery  Podiatry  Procedures:   2/23 -left lower extremity angiogram  Antimicrobials:  Anti-infectives (From admission, onward)   Start     Dose/Rate Route Frequency Ordered Stop   01/04/21 1400  ceFEPIme (MAXIPIME) 2 g in sodium chloride 0.9 % 100 mL IVPB  Status:  Discontinued        2 g 200 mL/hr over 30 Minutes Intravenous Every 24 hours 01/04/21 1318 01/04/2021 2131   12/22/20 1245  linezolid (ZYVOX) tablet 600 mg  Status:  Discontinued        600 mg  Oral Every 12 hours 12/22/20 1154 01-04-21 2131   12/21/2020 2000  vancomycin (VANCOCIN) IVPB 1000 mg/200 mL premix  Status:  Discontinued        1,000 mg 200 mL/hr over 60 Minutes Intravenous Every 48 hours 12/14/2020 2132 12/22/20 1154   12/21/2020 1530  ceFAZolin (ANCEF) IVPB 2g/100 mL premix  Status:  Discontinued       Note to Pharmacy: To be given in specials   2 g 200 mL/hr over 30 Minutes Intravenous  Once 12/08/2020 1521 11/30/2020 1753   11/28/2020 2200  vancomycin (VANCOREADY) IVPB 500 mg/100 mL        500 mg 100 mL/hr over 60 Minutes Intravenous  Once 12/16/2020 2131 12/19/20 0111   12/07/2020 2030  vancomycin (VANCOCIN) IVPB 1000 mg/200 mL premix        1,000 mg 200 mL/hr over 60 Minutes Intravenous  Once 11/28/2020 2020 12/24/2020 2211   12/17/2020 2015  piperacillin-tazobactam (ZOSYN) IVPB 3.375 g        3.375 g 100 mL/hr over 30 Minutes Intravenous  Once 12/17/2020 2011 12/05/2020 2141        Micro    Objective   Vitals:   2021-01-04 1320 01-04-21 1325 01/04/2021 1330 01/04/21 1342  BP: 98/62  125/88  Pulse:  68 (!) 118   Resp: 15 11 (!) 39 (!) 0  Temp:      TempSrc:      SpO2:  (!) 46%    Weight:      Height:       No intake or output data in the 24 hours ending 12/25/20 0833 Filed Weights   12/15/2020 1602 12/12/2020 1459 Dec 29, 2020 1250  Weight: 67.5 kg 67.1 kg 74.1 kg    Physical Exam:  General exam: awake, alert, no acute distress Respiratory system: diminished but overall clear, no wheezes or rhonchi, on baseline 4 L/min nasal cannula oxygen. Cardiovascular system: normal S1/S2, RRR, 3/6 blowing systolic murmur, no pedal edema.   Central nervous system: A&O x3. no gross focal neurologic deficits, normal speech Extremities: trace LE edema b/l, L>F distal LE erythema nontender, minimal differential warmth L>R Psychiatry: normal mood, congruent affect, judgement and insight appear normal  Labs   Data Reviewed: I have personally reviewed following labs and imaging  studies  CBC: Recent Labs  Lab 12/25/2020 1604 12/19/20 0017 12/19/20 0604 12/02/2020 0603 12/21/20 0636 12/22/20 0546 December 29, 2020 0519  WBC 9.2   < > 11.6* 6.8 9.5 10.2 9.2  NEUTROABS 7.3  --   --  4.4 7.1 8.0* 6.7  HGB 14.2   < > 13.1 13.2 14.2 14.8 14.8  HCT 44.0   < > 39.5 41.4 45.2 45.6 47.4*  MCV 93.0   < > 92.7 93.7 94.2 92.7 94.2  PLT 249   < > 191 184 190 167 165   < > = values in this interval not displayed.   Basic Metabolic Panel: Recent Labs  Lab 11/29/2020 1604 12/19/20 0017 12/19/20 0604 12/14/2020 0603 12/21/20 0636 12/22/20 0546 29-Dec-2020 0519  NA 137  --  135 140 140  --  136  K 3.5  --  3.1* 3.6 3.8  --  5.6*  CL 90*  --  90* 97* 96*  --  97*  CO2 35*  --  33* 34* 32  --  28  GLUCOSE 53*  --  312* 69* 140*  --  165*  BUN 38*  --  35* 36* 27*  --  34*  CREATININE 2.02*   < > 2.07* 1.94* 1.70* 1.56* 2.04*  CALCIUM 9.5  --  8.8* 9.1 9.7  --  10.2  MG  --   --   --  1.6* 1.9 1.7 1.7  PHOS  --   --   --  3.4 3.0 2.9 5.4*   < > = values in this interval not displayed.   GFR: Estimated Creatinine Clearance: 24.6 mL/min (A) (by C-G formula based on SCr of 2.04 mg/dL (H)). Liver Function Tests: Recent Labs  Lab 12/19/2020 1604 11/29/2020 0603  AST 33 21  ALT 19 15  ALKPHOS 52 45  BILITOT 1.7* 1.0  PROT 6.7 5.6*  ALBUMIN 3.3* 2.6*   No results for input(s): LIPASE, AMYLASE in the last 168 hours. No results for input(s): AMMONIA in the last 168 hours. Coagulation Profile: Recent Labs  Lab 12/06/2020 1604  INR 1.7*   Cardiac Enzymes: No results for input(s): CKTOTAL, CKMB, CKMBINDEX, TROPONINI in the last 168 hours. BNP (last 3 results) No results for input(s): PROBNP in the last 8760 hours. HbA1C: No results for input(s): HGBA1C in the last 72 hours. CBG: Recent Labs  Lab 12/22/20 2359 29-Dec-2020 0409 12/29/2020 0911 12-29-2020 1153 December 29, 2020 1251  GLUCAP 170* 174* 101* 50* 110*   Lipid Profile:  No results for input(s): CHOL, HDL, LDLCALC, TRIG,  CHOLHDL, LDLDIRECT in the last 72 hours. Thyroid Function Tests: No results for input(s): TSH, T4TOTAL, FREET4, T3FREE, THYROIDAB in the last 72 hours. Anemia Panel: No results for input(s): VITAMINB12, FOLATE, FERRITIN, TIBC, IRON, RETICCTPCT in the last 72 hours. Sepsis Labs: Recent Labs  Lab 12/19/20 1022 12/19/20 1230 12/19/20 1540 01-09-21 1229  LATICACIDVEN 1.3 1.5 1.7 9.9*    Recent Results (from the past 240 hour(s))  Culture, blood (Routine x 2)     Status: None   Collection Time: 12/01/2020  4:10 PM   Specimen: BLOOD  Result Value Ref Range Status   Specimen Description BLOOD LEFT ANTECUBITAL  Final   Special Requests   Final    BOTTLES DRAWN AEROBIC AND ANAEROBIC Blood Culture adequate volume   Culture   Final    NO GROWTH 5 DAYS Performed at [facility], [address], [place], [place] [address]    Report Status 2021-01-09 FINAL  Final  SARS CORONAVIRUS 2 (TAT 6-24 HRS) Nasopharyngeal Nasopharyngeal Swab     Status: None   Collection Time: 12/17/2020  8:51 PM   Specimen: Nasopharyngeal Swab  Result Value Ref Range Status   SARS Coronavirus 2 NEGATIVE NEGATIVE Final    Comment: (NOTE) SARS-CoV-2 target nucleic acids are NOT DETECTED.  The SARS-CoV-2 RNA is generally detectable in upper and lower respiratory specimens during the acute phase of infection. Negative results do not preclude SARS-CoV-2 infection, do not rule out co-infections with other pathogens, and should not be used as the sole basis for treatment or other patient management decisions. Negative results must be combined with clinical observations, patient history, and epidemiological information. The expected result is Negative.  Fact Sheet for Patients: SugarRoll.be  Fact Sheet for Healthcare Providers: https://www.woods-mathews.com/  This test is not yet approved or cleared by the [place] FDA and  has been authorized for detection  and/or diagnosis of SARS-CoV-2 by FDA under an Emergency Use Authorization (EUA). This EUA will remain  in effect (meaning this test can be used) for the duration of the COVID-19 declaration under Se ction 564(b)(1) of the Act, 21 U.S.C. section 360bbb-3(b)(1), unless the authorization is terminated or revoked sooner.  Performed at [facility], [place] [address] [place], [place] [address]   Culture, blood (Routine x 2)     Status: Abnormal   Collection Time: 12/16/2020  9:00 PM   Specimen: BLOOD  Result Value Ref Range Status   Specimen Description   Final    BLOOD BLOOD RIGHT FOREARM Performed at [facility], [address] [place], [place] [address]    Special Requests   Final    BOTTLES DRAWN AEROBIC AND ANAEROBIC Blood Culture adequate volume Performed at [facility], [address] [place], [place] [address]    Culture  Setup Time   Final    GRAM POSITIVE COCCI IN BOTH AEROBIC AND ANAEROBIC BOTTLES CRITICAL RESULT CALLED TO, READ BACK BY AND VERIFIED WITH: NATHAN BELUE _0  ON 12/07/2020 SKL Performed at [facility], [address] [place], [place] [address]    Culture STAPHYLOCOCCUS EPIDERMIDIS (A)  Final   Report Status 12/25/2020 FINAL  Final   Organism ID, Bacteria STAPHYLOCOCCUS EPIDERMIDIS  Final      Susceptibility   Staphylococcus epidermidis - MIC*    CIPROFLOXACIN <=0.5 SENSITIVE Sensitive     ERYTHROMYCIN >=8 RESISTANT Resistant     GENTAMICIN <=0.5 SENSITIVE Sensitive     OXACILLIN >=  4 RESISTANT Resistant     TETRACYCLINE 2 SENSITIVE Sensitive     VANCOMYCIN 2 SENSITIVE Sensitive     TRIMETH/SULFA <=10 SENSITIVE Sensitive     CLINDAMYCIN >=8 RESISTANT Resistant     RIFAMPIN <=0.5 SENSITIVE Sensitive     Inducible Clindamycin NEGATIVE Sensitive     * STAPHYLOCOCCUS EPIDERMIDIS  Blood Culture ID Panel (Reflexed)     Status: Abnormal   Collection Time: 12/25/2020  9:00 PM  Result Value Ref Range Status   Enterococcus  faecalis NOT DETECTED NOT DETECTED Final   Enterococcus Faecium NOT DETECTED NOT DETECTED Final   Listeria monocytogenes NOT DETECTED NOT DETECTED Final   Staphylococcus species DETECTED (A) NOT DETECTED Final    Comment: CRITICAL RESULT CALLED TO, READ BACK BY AND VERIFIED WITH: NATHAN BELUE _0  ON 12/14/2020 SKL    Staphylococcus aureus (BCID) NOT DETECTED NOT DETECTED Final   Staphylococcus epidermidis DETECTED (A) NOT DETECTED Final    Comment: Methicillin (oxacillin) resistant coagulase negative staphylococcus. Possible blood culture contaminant (unless isolated from more than one blood culture draw or clinical case suggests pathogenicity). No antibiotic treatment is indicated for blood  culture contaminants. CRITICAL RESULT CALLED TO, READ BACK BY AND VERIFIED WITH: NATHAN BELUE _1  ON 12/24/2020 SKL    Staphylococcus lugdunensis NOT DETECTED NOT DETECTED Final   Streptococcus species NOT DETECTED NOT DETECTED Final   Streptococcus agalactiae NOT DETECTED NOT DETECTED Final   Streptococcus pneumoniae NOT DETECTED NOT DETECTED Final   Streptococcus pyogenes NOT DETECTED NOT DETECTED Final   A.calcoaceticus-baumannii NOT DETECTED NOT DETECTED Final   Bacteroides fragilis NOT DETECTED NOT DETECTED Final   Enterobacterales NOT DETECTED NOT DETECTED Final   Enterobacter cloacae complex NOT DETECTED NOT DETECTED Final   Escherichia coli NOT DETECTED NOT DETECTED Final   Klebsiella aerogenes NOT DETECTED NOT DETECTED Final   Klebsiella oxytoca NOT DETECTED NOT DETECTED Final   Klebsiella pneumoniae NOT DETECTED NOT DETECTED Final   Proteus species NOT DETECTED NOT DETECTED Final   Salmonella species NOT DETECTED NOT DETECTED Final   Serratia marcescens NOT DETECTED NOT DETECTED Final   Haemophilus influenzae NOT DETECTED NOT DETECTED Final   Neisseria meningitidis NOT DETECTED NOT DETECTED Final   Pseudomonas aeruginosa NOT DETECTED NOT DETECTED Final   Stenotrophomonas maltophilia NOT  DETECTED NOT DETECTED Final   Candida albicans NOT DETECTED NOT DETECTED Final   Candida auris NOT DETECTED NOT DETECTED Final   Candida glabrata NOT DETECTED NOT DETECTED Final   Candida krusei NOT DETECTED NOT DETECTED Final   Candida parapsilosis NOT DETECTED NOT DETECTED Final   Candida tropicalis NOT DETECTED NOT DETECTED Final   Cryptococcus neoformans/gattii NOT DETECTED NOT DETECTED Final   Methicillin resistance mecA/C DETECTED (A) NOT DETECTED Final    Comment: CRITICAL RESULT CALLED TO, READ BACK BY AND VERIFIED WITH: NATHAN BELUE _2  ON 12/08/2020 SKL Performed at [facility], [address] [place], [place] [address]   MRSA PCR Screening     Status: None   Collection Time: 12/19/20 11:13 PM   Specimen: Nasal Mucosa; Nasopharyngeal  Result Value Ref Range Status   MRSA by PCR NEGATIVE NEGATIVE Final    Comment:        The GeneXpert MRSA Assay (FDA approved for NASAL specimens only), is one component of a comprehensive MRSA colonization surveillance program. It is not intended to diagnose MRSA infection nor to guide or monitor treatment for MRSA infections. Performed at [facility], [address], [place], [place] [address]  Imaging Studies   DG Abd 1 View  Result Date: January 11, 2021 CLINICAL DATA:  Enteric tube placement. EXAM: ABDOMEN - 1 VIEW COMPARISON:  None. FINDINGS: Enteric tube is visualized with tip and side-port over the stomach in the left upper quadrant. Bowel gas pattern is nonobstructive. Curvature of the lumbar spine convex right with moderate degenerative changes present. IMPRESSION: 1. Nonobstructive bowel gas pattern. 2. Enteric tube with tip and side-port over the stomach in the left upper quadrant. Electronically Signed   By: Marin Olp M.D.   On: January 11, 2021 13:55   DG Chest Port 1 View  Result Date: 2021-01-11 CLINICAL DATA:  Enteric tube placement and intubation. EXAM: PORTABLE CHEST 1 VIEW COMPARISON:  01/03/2020  and 04/19/2018 FINDINGS: Endotracheal tube has tip 5.4 cm above the carina. Enteric tube courses into the region of the stomach and off the film as tip is not visualized. Lungs are adequately inflated without focal airspace consolidation or effusion. There is prominence of the right infrahilar region. Stable prominence of the main pulmonary artery segment. Remainder the exam is unchanged. IMPRESSION: 1. No acute cardiopulmonary disease. 2. Tubes and lines as described. 3. Prominence of the right infrahilar region. Consider PA and lateral chest radiograph for better evaluation of the right hilum. Electronically Signed   By: Marin Olp M.D.   On: January 11, 2021 13:53     Medications   Scheduled Meds:  Continuous Infusions:      LOS: 5 days    Time spent: 25 minutes with >50% spent in coordination of care and at bedside    Ezekiel Slocumb, DO [facility]  12/25/2020, 8:33 AM      If 7PM-7AM, please contact night-coverage. How to contact the [facility] Attending or Consulting provider [place] or covering provider during after hours [place], for this patient?    1. Check the care team in [facility] and look for a) attending/consulting [facility] provider listed and b) the [facility] team listed 2. Log into [email or web address] and use [place]'s universal password to access. If you do not have the password, please contact the hospital operator. 3. Locate the [facility] provider you are looking for under [facility] and page to a number that you can be directly reached. 4. If you still have difficulty reaching the provider, please page the [facility] (Director on Call) for the Hospitalists listed on amion for assistance.

## 2020-12-22 NOTE — Progress Notes (Signed)
Occupational Therapy Treatment Patient Details Name: Lindsay Harmon MRN: [id number] DOB: [birth year] Today's Date: 12/22/2020    History of present illness Lindsay Harmon is a 65 y.o. female with medical history significant for Chronic diastolic heart failure, CAD, CKD 4, COPD on home O2 at 4 L, HTN, DM2 with polyneuropathy and PVD, hospitalized for right lower extremity cellulitis from 1/28-2/1 who was sent in by her podiatrist for IV antibiotics and vascular due to recurrent cellulitis, this time of her left lower extremity   OT comments  Upon entering the room, pt supine in bed and finishing lunch tray. Pt reports 7/10 pain in lower back and L LE and requesting pain medication. OT began education regarding energy conservation for self care tasks and functional mobility. Pt's sister entered the room and OT briefly discussed as well with pt also verbalizing what we discussed. Pt concerned over increased R UE edema and IV placement. OT discussed elevation of UEs to decrease edema. OT notified RN of pt concerns. Pt remained in bed secondary to back pain this session. All needs within reach.   Follow Up Recommendations  No OT follow up    Equipment Recommendations  None recommended by OT       Precautions / Restrictions Precautions Precautions: Fall Precaution Comments: mod fall              ADL either performed or assessed with clinical judgement        Vision Patient Visual Report: No change from baseline            Cognition Arousal/Alertness: Awake/alert Behavior During Therapy: WFL for tasks assessed/performed Overall Cognitive Status: Within Functional Limits for tasks assessed                                                     Pertinent Vitals/ Pain       Pain Assessment: 0-10 Pain Score: 7  Pain Location: L leg and low back Pain Descriptors / Indicators: Discomfort;Sore Pain Intervention(s): Limited activity within patient's  tolerance;Monitored during session;Repositioned;Patient requesting pain meds-RN notified      Frequency  Min 2X/week        Progress Toward Goals  OT Goals(current goals can now be found in the care plan section)  Progress towards OT goals: Progressing toward goals  Acute Rehab OT Goals Patient Stated Goal: To return home OT Goal Formulation: With patient Time For Goal Achievement: 01/04/21 Potential to Achieve Goals: Good  Plan Discharge plan remains appropriate       AM-PAC OT "6 Clicks" Daily Activity     Outcome Measure   Help from another person eating meals?: None Help from another person taking care of personal grooming?: None Help from another person toileting, which includes using toliet, bedpan, or urinal?: None Help from another person bathing (including washing, rinsing, drying)?: A Little Help from another person to put on and taking off regular upper body clothing?: None Help from another person to put on and taking off regular lower body clothing?: A Little 6 Click Score: 22    End of Session    OT Visit Diagnosis: Muscle weakness (generalized) (M62.81)   Activity Tolerance Patient tolerated treatment well   Patient Left in bed;with call bell/phone within reach   Nurse Communication Mobility status;Other (comment) (concern over R UE edema and IV)  Time: 7334-4830 OT Time Calculation (min): 25 min  Charges: OT General Charges $OT Visit: 1 Visit OT Treatments $Therapeutic Activity: 23-37 mins

## 2020-12-23 ENCOUNTER — Inpatient Hospital Stay: Payer: PPO

## 2020-12-23 DIAGNOSIS — I469 Cardiac arrest, cause unspecified: Secondary | ICD-10-CM | POA: Diagnosis not present

## 2020-12-23 DIAGNOSIS — L03116 Cellulitis of left lower limb: Secondary | ICD-10-CM | POA: Diagnosis not present

## 2020-12-23 DIAGNOSIS — I5032 Chronic diastolic (congestive) heart failure: Secondary | ICD-10-CM

## 2020-12-23 LAB — CBC WITH DIFFERENTIAL/PLATELET
Abs Immature Granulocytes: 0.12 10*3/uL — ABNORMAL HIGH (ref 0.00–0.07)
Basophils Absolute: 0.1 10*3/uL (ref 0.0–0.1)
Basophils Relative: 1 %
Eosinophils Absolute: 0.1 10*3/uL (ref 0.0–0.5)
Eosinophils Relative: 1 %
HCT: 47.4 % — ABNORMAL HIGH (ref 36.0–46.0)
Hemoglobin: 14.8 g/dL (ref 12.0–15.0)
Immature Granulocytes: 1 %
Lymphocytes Relative: 17 %
Lymphs Abs: 1.5 10*3/uL (ref 0.7–4.0)
MCH: 29.4 pg (ref 26.0–34.0)
MCHC: 31.2 g/dL (ref 30.0–36.0)
MCV: 94.2 fL (ref 80.0–100.0)
Monocytes Absolute: 0.7 10*3/uL (ref 0.1–1.0)
Monocytes Relative: 7 %
Neutro Abs: 6.7 10*3/uL (ref 1.7–7.7)
Neutrophils Relative %: 73 %
Platelets: 165 10*3/uL (ref 150–400)
RBC: 5.03 MIL/uL (ref 3.87–5.11)
RDW: 15.6 % — ABNORMAL HIGH (ref 11.5–15.5)
WBC: 9.2 10*3/uL (ref 4.0–10.5)
nRBC: 0 % (ref 0.0–0.2)

## 2020-12-23 LAB — BASIC METABOLIC PANEL
Anion gap: 11 (ref 5–15)
BUN: 34 mg/dL — ABNORMAL HIGH (ref 8–23)
CO2: 28 mmol/L (ref 22–32)
Calcium: 10.2 mg/dL (ref 8.9–10.3)
Chloride: 97 mmol/L — ABNORMAL LOW (ref 98–111)
Creatinine, Ser: 2.04 mg/dL — ABNORMAL HIGH (ref 0.44–1.00)
GFR, Estimated: 26 mL/min — ABNORMAL LOW (ref >60)
Glucose, Bld: 165 mg/dL — ABNORMAL HIGH (ref 70–99)
Potassium: 5.6 mmol/L — ABNORMAL HIGH (ref 3.5–5.1)
Sodium: 136 mmol/L (ref 135–145)

## 2020-12-23 LAB — GLUCOSE, CAPILLARY
Glucose-Capillary: 101 mg/dL — ABNORMAL HIGH (ref 70–99)
Glucose-Capillary: 110 mg/dL — ABNORMAL HIGH (ref 70–99)
Glucose-Capillary: 170 mg/dL — ABNORMAL HIGH (ref 70–99)
Glucose-Capillary: 174 mg/dL — ABNORMAL HIGH (ref 70–99)
Glucose-Capillary: 50 mg/dL — ABNORMAL LOW (ref 70–99)

## 2020-12-23 LAB — PHOSPHORUS: Phosphorus: 5.4 mg/dL — ABNORMAL HIGH (ref 2.5–4.6)

## 2020-12-23 LAB — TROPONIN I (HIGH SENSITIVITY): Troponin I (High Sensitivity): 126 ng/L (ref <18)

## 2020-12-23 LAB — MAGNESIUM: Magnesium: 1.7 mg/dL (ref 1.7–2.4)

## 2020-12-23 LAB — CULTURE, BLOOD (ROUTINE X 2)
Culture: NO GROWTH
Special Requests: ADEQUATE

## 2020-12-23 LAB — LACTIC ACID, PLASMA: Lactic Acid, Venous: 9.9 mmol/L (ref 0.5–1.9)

## 2020-12-23 MED ORDER — NOREPINEPHRINE 4 MG/250ML-% IV SOLN
INTRAVENOUS | Status: AC
  Filled 2020-12-23: qty 250

## 2020-12-23 MED ORDER — ETOMIDATE 2 MG/ML IV SOLN
INTRAVENOUS | Status: AC
  Administered 2020-12-23: 20 mg via INTRAVENOUS
  Filled 2020-12-23: qty 10

## 2020-12-23 MED ORDER — SODIUM ZIRCONIUM CYCLOSILICATE 10 G PO PACK
10.0000 g | PACK | Freq: Once | ORAL | Status: AC
  Administered 2020-12-23: 10 g via ORAL
  Filled 2020-12-23: qty 1

## 2020-12-23 MED ORDER — ETOMIDATE 2 MG/ML IV SOLN: 20.0000 mg | Freq: Once | INTRAVENOUS | Status: AC

## 2020-12-23 MED ORDER — DEXTROSE 50 % IV SOLN
INTRAVENOUS | Status: AC
  Administered 2020-12-23: 50 mL
  Filled 2020-12-23: qty 50

## 2020-12-23 MED ORDER — SODIUM CHLORIDE 0.9 % IV BOLUS: 500.0000 mL | Freq: Once | INTRAVENOUS | Status: DC

## 2020-12-23 MED ORDER — SODIUM CHLORIDE 0.9 % IV SOLN: INTRAVENOUS | Status: DC

## 2020-12-23 MED ORDER — SODIUM CHLORIDE 0.9 % IV BOLUS
500.0000 mL | Freq: Once | INTRAVENOUS | Status: AC
  Administered 2020-12-23: 500 mL via INTRAVENOUS

## 2020-12-23 MED ORDER — ENOXAPARIN SODIUM 30 MG/0.3ML ~~LOC~~ SOLN: 30.0000 mg | SUBCUTANEOUS | Status: DC

## 2020-12-23 MED ORDER — ALUM & MAG HYDROXIDE-SIMETH 200-200-20 MG/5ML PO SUSP
30.0000 mL | ORAL | Status: DC | PRN
  Administered 2020-12-23: 30 mL via ORAL
  Filled 2020-12-23: qty 30

## 2020-12-23 MED ORDER — FENTANYL CITRATE (PF) 100 MCG/2ML IJ SOLN: 100.0000 ug | Freq: Once | INTRAMUSCULAR | Status: AC

## 2020-12-23 MED ORDER — FENTANYL CITRATE (PF) 100 MCG/2ML IJ SOLN
INTRAMUSCULAR | Status: AC
  Administered 2020-12-23: 100 ug via INTRAVENOUS
  Filled 2020-12-23: qty 2

## 2020-12-23 MED ORDER — ALBUMIN HUMAN 5 % IV SOLN: 12.5000 g | Freq: Once | INTRAVENOUS | Status: DC

## 2020-12-23 MED ORDER — SODIUM CHLORIDE 0.9 % IV SOLN
2.0000 g | INTRAVENOUS | Status: DC
  Filled 2020-12-23 (×2): qty 2

## 2020-12-23 MED ORDER — CHLORHEXIDINE GLUCONATE CLOTH 2 % EX PADS: 6.0000 | MEDICATED_PAD | Freq: Every day | CUTANEOUS | Status: DC

## 2020-12-23 MED FILL — Medication: Qty: 1 | Status: AC

## 2020-12-25 LAB — CULTURE, BLOOD (ROUTINE X 2): Special Requests: ADEQUATE

## 2020-12-25 LAB — GLUCOSE, CAPILLARY
Glucose-Capillary: 11 mg/dL — CL (ref 70–99)
Glucose-Capillary: <10 mg/dL — CL (ref 70–99)

## 2020-12-25 SURGERY — ENDARTERECTOMY, FEMORAL
Anesthesia: General | Laterality: Right

## 2020-12-26 NOTE — Code Documentation (Addendum)
PCCM  Refer to [place] documentation for timing and administration of assist and  Medications  The patient was brought as a transfer to the ICU where a CODE BLUE was promptly called overhead for bradycardia and hypoxia.  The patient was severely centrally hypoxic and dusky, her entire face was cyanotic Decision to intubate was quickly made and completed Worry for history provided by cards attending of pulmonary pressures in excess of 100 Addition of PPV with this level of pulmonary hypertension and RV dysfunction is terrible  The patient did indeed brady soon after intubation despite the addition of Levophed and fluids  She became asystolic did not respond to ACLS medications remaining without pulse  The code was run for roughly 10 minutes, given her depth of hypoxia and persistent asystole.  Family updated in the waiting area.  //Karinne Schmader

## 2020-12-26 NOTE — Progress Notes (Signed)
Dr. Arbutus Ped at bedside speaking with patient's son and sister.

## 2020-12-26 NOTE — Progress Notes (Signed)
Dr. Arbutus Ped came back to room to answer more questions that the family have pertaining to what led up to today's event.

## 2020-12-26 NOTE — Progress Notes (Signed)
Patient arrived to ICU alert and following commands at 1245.  Lips bluish along with ears and nose.  O2 sats were low 90's on 4 L nasal cannula. Durene Fruits, RN contacted Dr. Arbutus Ped to make her aware of patient's status and asked for abg order.  At 1310 patient started becoming lethargic with decreased responsiveness and O2 sats dropping into the 80's.  Heather, RRT at bedside and started bagging patient and Dr. Merrilee Jansky was called by Joellen Jersey, RN charge nurse to come to bedside. Once MD came to bedside patient was intubated.

## 2020-12-26 NOTE — Progress Notes (Signed)
PHARMACIST - PHYSICIAN COMMUNICATION  CONCERNING:  Enoxaparin (Lovenox) for DVT Prophylaxis    RECOMMENDATION: Patient was prescribed enoxaparin 84m q24 hours for VTE prophylaxis.   Filed Weights   12/12/2020 1602 12/07/2020 1459  Weight: 67.5 kg (148 lb 12.8 oz) 67.1 kg (148 lb)    Body mass index is 27.96 kg/m.  Estimated Creatinine Clearance: 23.4 mL/min (A) (by C-G formula based on SCr of 2.04 mg/dL (H)).  Patient is candidate for enoxaparin 356mevery 24 hours based on CrCl <3056min or Weight <45kg  DESCRIPTION: [facility]  Patient is now receiving enoxaparin 30 mg every 24 hours    AleBenita [id number] AM

## 2020-12-26 NOTE — Progress Notes (Addendum)
   2021-01-14 1140  Assess: MEWS Score  Temp 99 F (37.2 C)  BP (!) 50/20  Pulse Rate 83  Resp 18  SpO2 (!) 42 %  Assess: MEWS Score  MEWS Temp 0  MEWS Systolic 3  MEWS Pulse 0  MEWS RR 0  MEWS LOC 0  MEWS Score 3  MEWS Score Color Yellow  Assess: if the MEWS score is Yellow or Red  Were vital signs taken at a resting state? Yes  Focused Assessment No change from prior assessment  Early Detection of Sepsis Score *See Row Information* Low  MEWS guidelines implemented *See Row Information* Yes  Treat  MEWS Interventions Administered scheduled meds/treatments;Administered prn meds/treatments;Escalated (See documentation below)  Pain Scale 0-10  Pain Score 0  Take Vital Signs  Increase Vital Sign Frequency  Yellow: Q 2hr X 2 then Q 4hr X 2, if remains yellow, continue Q 4hrs  Escalate  MEWS: Escalate Yellow: discuss with charge nurse/RN and consider discussing with provider and RRT  Notify: Charge Nurse/RN  Name of Charge Nurse/RN Notified Amanda, RN  Date Charge Nurse/RN Notified 12/22/20  Time Charge Nurse/RN Notified 1150  Notify: Provider  Provider Name/Title Dr. Arbutus Ped  Date Provider Notified 12/22/20  Time Provider Notified 1150  Notification Type Page  Notification Reason Change in status  Provider response See new orders  Date of Provider Response 12/22/20  Time of Provider Response 1200  Document  Patient Outcome Other (Comment) (Not sure - will continue to monitor)  Progress note created (see row info) Yes   Dr. Hulen Skains and rounded to assess. Patient cool to touch, BP in 50s, 99 temp and cbg of 50.  Patient had vomitted 2x and complained of "feeling like crap."  D50 pushed, fluids started and bolus also to be given.  Transferred to ICU Rm 11.  Family informed of transfer and report given.

## 2020-12-26 NOTE — Consult Note (Signed)
Cardiology Consultation:   Patient ID: Lindsay Harmon MRN: [id number]; DOB: 1956-10-25  Admit date: 11/30/2020 Date of Consult: 01/02/2021  PCP:  Venia Carbon, MD   Reading  Cardiologist:  Glori Bickers, MD  Advanced Practice Provider:  No care team member to display Electrophysiologist:  None   Patient Profile:   Lindsay Harmon is a 65 y.o. female with a hx of HTN, DM2, IBS, depression, GERD, severe COPD, PAD, hyponatremia, pulmonary hypertension with RV failure/cor pulmonale, and chronic diastolic heart failure who is being seen today for the evaluation of hypotension at the request of dr. Arbutus Ped.  History of Present Illness:   Ms. Lindsay Harmon is followed by Dr. Haroldine Laws for the above cardiac issues. She is followed by Dr. Fletcher Anon for PAD.   Patient was admitted 8/16 with volume overload and severe hyponatremia and diuresed. Transitioned back to torsemide and metolazone.   Admitted 12/16 with volume depletion and hyponatremia with Na 113 and hydrated. On torsemide 20 mg BID and occasional metolzaone  Admitted [facility] 6/23-6/28 with respiratory failure and AKI thought to related to pulmonary HTN. Sildenifil was stopped. ACE stopped 2/2 hypotension. Torsemide stopped. Weaned to 4L. Palliative care was consulted.   Patient has been followed by Pulmonology. At one point she was on sildenafil, but this was stopped since it was felt to worsening shunting. She was started on Tyvazo and lost to follow-up.   Admitted to [facility] for acute on chronic diastolic right sided heart failure and diuresed with IV lasix with improvement. She was placed on torsemide. Cho showed LVEF 60-65%, G1DD and severe pulmonary hypertension, PA systolic pressure [id number] showed progression of AS, now moderate to severe, mean gradient measures 66mHg.   She was seen in hospital follow-up and was volume overloaded and torsemide was increased. At follow-up weight was down 13 lbs.   Seen  06/06/20 by Dr. AFletcher Anonfor PAD. She underwent vascular studies which showed ABI 0.78 on the right and 0.73 on the left. Duplex showed moderate diffuse right SFA disease occluded mid left SFA.   The patient was admitted 1/28-2/1 for right lower extremity cellulitis . She had been seen by her podiatrist for IV antibiotics.   She was seen by [facility] in the office 2/22 and was set up for lower angiography which was performed 2/13. She was admitted post procedure. This morning she became acutely hypotensive and given IVF bolus. She was transferred to the ICU and cardiology was consulted.   Before evaluationCODE BLUE was called, myself and MD met at patient's room. Patient noted to have cold extremities, difficult to palpate pulse. Tele showed ST, no bradycardia, appeared to be respiratory failure. Patient was subsequently intubated.   Shortly after intubation patient became bradycardic and second CODE BLUE was called and ACLS was started. ACLS was performed for 10 minutes with no improvement; patient ultimately passed away.    Past Medical History:  Diagnosis Date  . Allergy   . Aortic atherosclerosis ([place]   . Arthritis   . CKD (chronic kidney disease), stage IV ([place]   . COPD (chronic obstructive pulmonary disease) ([place]    a. 11/2014 PFT's: FEV1 0.87 (38%), FVC 1.41 (48%), FEF 25-75 0.41 (19%). Unable to perform DLCO; b. 12/2017 Simple spirometry ration 67%. FEV1 1.21 L+53% predicted. FVC 1.79L 61% predicted.  . Coronary artery calcification seen on CT scan    a. 03/2017 CT Chest.  . Depression   . Diabetic neuropathy ([place]   . Erythrocytosis 12/13/2019  .  Esophageal stricture   . Familial hematuria   . GERD (gastroesophageal reflux disease)   . Hyperlipidemia   . Hypertension   . IBS (irritable bowel syndrome)   . Nephrolithiasis   . NIDDM (non-insulin dependent diabetes mellitus)   . Obesity, unspecified   . Pulmonary hypertension ([place])    a. WHO Group 3; b. 10/2016 Echo: EF 55-60%, no rwma,  Gr1 DD, mild to mod AS, mean grad 9mHg, mildly dil RV w/ mildly reduced RV fxn, mildly dil RA. PASP 1131mg; c. 12/2017 RHC: RA 11, RV 92/12, PA 92/33(57), PCWP 15, Fick CO/CI 5.9/3.3. PVR 7.2 WU. Ao 93%, PA 62/64%.  . Marland KitchenVD (peripheral vascular disease) ([place]    Past Surgical History:  Procedure Laterality Date  . ABDOMINAL HYSTERECTOMY    . CARDIAC CATHETERIZATION    . CARPAL TUNNEL RELEASE    . CESAREAN SECTION    . ERCP N/A 03/17/2017   Procedure: ENDOSCOPIC RETROGRADE CHOLANGIOPANCREATOGRAPHY (ERCP);  Surgeon: WoLucilla LameMD;  [facility];  Service: Endoscopy;  Laterality: N/A;  . EUS N/A 03/13/2017   Procedure: FULL UPPER ENDOSCOPIC ULTRASOUND (EUS) RADIAL;  Surgeon: Burbridge, ReMurray HodgkinsMD;  Location: [facility] ENDOSCOPY;  Service: Endoscopy;  Laterality: N/A;  . LOWER EXTREMITY ANGIOGRAPHY Left 12/19/2020   Procedure: Lower Extremity Angiography;  Surgeon: DeAlgernon HuxleyMD;  [facility];  Service: Cardiovascular;  Laterality: Left;  . RIGHT HEART CATH N/A 01/22/2018   Procedure: RIGHT HEART CATH;  Surgeon: BeJolaine ArtistMD;  [facility];  Service: Cardiovascular;  Laterality: N/A;  . RIGHT HEART CATHETERIZATION N/A 12/05/2014   Procedure: RIGHT HEART CATH;  Surgeon: HeSinclair GroomsMD;  [facility];  Service: Cardiovascular;  Laterality: N/A;     Home Medications:  Prior to Admission medications   Medication Sig Start Date End Date Taking? Authorizing Provider  albuterol (PROVENTIL) (2.5 MG/3ML) 0.083% nebulizer solution USE 1 VIAL (3ML) BY NEBULIZATION EVERY 6HOURS AS NEEDED FOR WHEEZING OR SHORTNESS OF BREATH Patient taking differently: Take 2.5 mg by nebulization every 6 (six) hours as needed for wheezing or shortness of breath. 11/10/19  Yes WaMartyn EhrichNP  ALPRAZolam (XDuanne Moron0.25 MG tablet Take 1 tablet (0.25 mg total) by mouth daily as needed for anxiety. 07/24/18  Yes LeVenia CarbonMD  aspirin EC 81 MG  tablet Take 1 tablet (81 mg total) by mouth daily. 03/23/19  Yes ArWellington HampshireMD  calcitRIOL (ROCALTROL) 0.5 MCG capsule Take 0.5 mcg by mouth daily.  02/03/17  Yes [provider]  cholecalciferol (VITAMIN D) 1000 units tablet Take 1,000 Units by mouth at bedtime.    Yes [provider]  ferrous sulfate 325 (65 FE) MG tablet Take 325 mg by mouth daily with breakfast. 08/03/20 08/03/21 Yes [provider]  gabapentin (NEURONTIN) 300 MG capsule TAKE 1 CAPSULE BY MOUTH IN THE MORNING AND 2 CAPSULES BY MOUTH AT BEDTIME Patient taking differently: Take 300-600 mg by mouth See admin instructions. Take 3008my mouth in the morning and 600m53m bedtime. 12/08/20  Yes LetvViviana SimplerMD  insulin glargine (LANTUS SOLOSTAR) 100 UNIT/ML Solostar Pen Inject 15 Units into the skin daily. Patient taking differently: Inject 13 Units into the skin daily. 08/02/20  Yes LetvVenia Carbon  loratadine (CLARITIN) 10 MG tablet Take 10 mg by mouth 2 (two) times daily.   Yes Tisovec, RichFransico Him  lovastatin (MEVACOR) 40 MG tablet Take 2  tablets (80 mg total) by mouth at bedtime. 12/08/20  Yes Venia Carbon, MD  methadone (DOLOPHINE) 10 MG tablet TAKE 2 TABLETS BY MOUTH EVERY 6 HOURS ASNEEDED Patient taking differently: Take 20 mg by mouth every 6 (six) hours as needed for moderate pain. 11/01/20  Yes Venia Carbon, MD  metolazone (ZAROXOLYN) 2.5 MG tablet TAKE 1 TABLET BY MOUTH ONCE A WEEK EVERYTUESDAY Patient taking differently: Take 2.5 mg by mouth every Friday. 12/07/20  Yes Bensimhon, Shaune Pascal, MD  metoprolol tartrate (LOPRESSOR) 25 MG tablet Take 1/2 tablet by mouth twice a day Patient taking differently: Take 12.5 mg by mouth 2 (two) times daily. 05/09/20  Yes Venia Carbon, MD  mometasone (ASMANEX, 120 METERED DOSES,) 220 MCG/INH inhaler Inhale 2 puffs into the lungs 2 (two) times daily. 12/06/20  Yes Olalere, Adewale A, MD  nitroGLYCERIN (NITROSTAT) 0.4 MG SL tablet Place 1  tablet (0.4 mg total) under the tongue every 5 (five) minutes as needed for chest pain. 06/06/20  Yes Wellington Hampshire, MD  pantoprazole (PROTONIX) 40 MG tablet Take 1 tablet by mouth twice a day Patient taking differently: Take 40 mg by mouth 2 (two) times daily. 06/22/20  Yes Venia Carbon, MD  potassium chloride SA (KLOR-CON) 20 MEQ tablet Take 2 tablets (40 mEq total) by mouth once a week. Every Tuesday Patient taking differently: Take 40 mEq by mouth every Friday. 12/05/20  Yes Bensimhon, Shaune Pascal, MD  PROAIR HFA [phone] Base) MCG/ACT inhaler USE 2 PUFFS EVERY 6 HOURS AS NEEDED FOR WHEEZING OR SHORTNESS OF BREATH Patient taking differently: Inhale 2 puffs into the lungs every 6 (six) hours as needed for wheezing or shortness of breath. 12/14/20  Yes Olalere, Adewale A, MD  Tiotropium Bromide-Olodaterol (STIOLTO RESPIMAT) 2.5-2.5 MCG/ACT AERS INHALE 2 PUFFS BY MOUTH INTO THE LUNGS DAILY Patient taking differently: Inhale 2 puffs into the lungs daily. 12/14/20  Yes Olalere, Adewale A, MD  torsemide (DEMADEX) 20 MG tablet Take 3 tablets (60 mg total) by mouth 2 (two) times daily. Hold afternoon dose if your weight is under 150# at home Patient taking differently: No sig reported 10/10/20  Yes Venia Carbon, MD  Treprostinil (TYVASO) 0.6 MG/ML SOLN Inhale 18 mcg into the lungs 4 (four) times daily.   Yes [provider]  glucose blood (ONE TOUCH ULTRA TEST) test strip USE 1 STRIP TO TEST BLOOD SUGAR TWICE DAILY DUE TO LOW BLOOD SUGAR. TAKES LANTUS INSULIN Dx Code E11.51 08/10/20   Viviana Simpler I, MD  Insulin Pen Needle (B-D UF III MINI PEN NEEDLES) 31G X 5 MM MISC USE 1 PEN NEEDLE TO INJECT INSULIN 01/11/20   Venia Carbon, MD  Respiratory Therapy Supplies (FLUTTER) DEVI Use as directed 12/10/19   Tyler Pita, MD    Inpatient Medications: Scheduled Meds: . aspirin EC  81 mg Oral Daily  . budesonide  0.25 mg Nebulization BID  . calcitRIOL  0.5 mcg Oral Daily  .  cholecalciferol  1,000 Units Oral QHS  . enoxaparin (LOVENOX) injection  30 mg Subcutaneous Q24H  . ferrous sulfate  325 mg Oral Q breakfast  . gabapentin  300 mg Oral Daily  . gabapentin  600 mg Oral QHS  . insulin aspart  0-6 Units Subcutaneous Q4H  . linezolid  600 mg Oral Q12H  . loratadine  10 mg Oral BID  . pantoprazole  40 mg Oral BID  . pravastatin  80 mg Oral q1800  . Treprostinil  18 mcg Inhalation QID   Continuous Infusions: . sodium chloride 75 mL/hr at 01/01/21 1224   PRN Meds: acetaminophen **OR** acetaminophen, albuterol, ALPRAZolam, alum & mag hydroxide-simeth, ipratropium-albuterol, methadone, morphine injection, nitroGLYCERIN, ondansetron **OR** ondansetron (ZOFRAN) IV, ondansetron (ZOFRAN) IV  Allergies:    Allergies  Allergen Reactions  . Sertraline Hcl Other (See Comments)    Altered Mental Status    Social History:   Social History   Socioeconomic History  . Marital status: Legally Separated    Spouse name: Not on file  . Number of children: 1  . Years of education: Not on file  . Highest education level: Not on file  Occupational History  . 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 Smoking status: Former Smoker    Packs/day: 1.50    Years: 33.00    Pack years: 49.50    Types: Cigarettes    Quit date: 07/28/2005    Years since quitting: 15.4  . Smokeless tobacco: Never Used  Vaping Use  . Vaping Use: Never used  Substance and Sexual Activity  . Alcohol use: No    Alcohol/week: 0.0 standard drinks    Comment: heavy in the past  . Drug use: No  . Sexual activity: Never  Other Topics Concern  . Not on file  Social History Narrative   No living will   Son [name] (then mom) should make decisions for her if she is unable.    Would accept resuscitation attempts but no prolonged ventilation   Not sure about tube feeds but probably wouldn't want them if cognitively unaware   Social Determinants of  Health   Financial Resource Strain: Low Risk   . Difficulty of Paying Living Expenses: Not very hard  Food Insecurity: Not on file  Transportation Needs: Not on file  Physical Activity: Not on file  Stress: Not on file  Social Connections: Not on file  Intimate Partner Violence: Not on file    Family History:    Family History  Problem Relation Age of Onset  . Diabetes Mother   . Cancer Mother        ovarian,melanoma  . Allergies Father   . Cancer Father        lung  . Cancer Maternal Grandmother        uterine  . Emphysema Paternal Grandfather   . Allergies Sister   . Breast cancer Neg Hx      ROS:  Please see the history of present illness.  All other ROS reviewed and negative.     Physical Exam/Data:   Vitals:   January 01, 2021 0901 01/01/2021 0906 01-Jan-2021 0908 01/01/2021 1140  BP: (!) 85/67 117/85 117/80 (!) 50/20  Pulse: 93 75  83  Resp: 19   18  Temp: 99 F (37.2 C)   99 F (37.2 C)  TempSrc:      SpO2: (!) 88%   (!) 42%  Weight:      Height:        Intake/Output Summary (Last 24 hours) at 01-01-2021 1236 Last data filed at 2021-01-01 0508 Gross per 24 hour  Intake 240 ml  Output 670 ml  Net -430 ml   Last 3 Weights 12/03/2020 11/30/2020 12/07/2020  Weight (lbs) 148 lb 148 lb 12.8 oz 142 lb  Weight (kg) 67.132 kg 67.495 kg 64.411 kg  Some encounter information is confidential and restricted. Go to Review Flowsheets activity to see all data.  Body mass index is 27.96 kg/m.   Patient is deceased   EKG:  The EKG was personally reviewed and demonstrates:  No EKG this admission Telemetry:  Telemetry was personally reviewed and demonstrates:  First code showed ST/SR, after intubation patient became bradycardic into the 30s.   Relevant CV Studies:  Echo ordered  Echo 01/04/20 1. Left ventricular ejection fraction, by estimation, is 60 to 65%. The  left ventricle has normal function. The left ventricle has no regional  wall motion abnormalities. There is  mild left ventricular hypertrophy.  Left ventricular diastolic parameters  are consistent with Grade I diastolic dysfunction (impaired relaxation).  There is the interventricular septum is flattened in systole and diastole,  consistent with right ventricular pressure and volume overload.  2. There is severe pulmonary hypertension with PA systolic pressure of  951-884 mmHg plus central venous pressure (which exceeds recorded systemic  pressure). Right ventricular systolic function is moderately reduced. The  right ventricular size is  severely enlarged. mildly increased right ventricular wall thickness.  3. Right atrial size was moderately dilated.  4. The mitral valve is degenerative. No evidence of mitral valve  regurgitation. No evidence of mitral stenosis.  5. Tricuspid valve regurgitation is severe.  6. The aortic valve has an indeterminant number of cusps. Aortic valve  regurgitation is not visualized. Moderate to severe aortic valve stenosis.  Aortic valve area, by VTI measures 0.60 cm. Aortic valve mean gradient  measures 31.3 mmHg. Aortic valve  Vmax measures 3.41 m/s.    Right heart Cath 2019 Findings:  RA = 11 RV = 92/12 PA = 92/33 (57) PCW = 15 Fick cardiac output/index = 5.9/3.3 PVR = 7.2 WU Ao sat = 93% PA sat = 62%, 64%  Assessment:  1. Severe PAH with normal left-sided pressures and cardiac output  Plan/Discussion:  Consider selective pulmonary vasodilator therapy.   Glori Bickers, MD  11:04 AM     Laboratory Data:  High Sensitivity Troponin:  No results for input(s): TROPONINIHS in the last 720 hours.   Chemistry Recent Labs  Lab 12/12/2020 0603 12/21/20 0636 12/22/20 0546 01/22/21 0519  NA 140 140  --  136  K 3.6 3.8  --  5.6*  CL 97* 96*  --  97*  CO2 34* 32  --  28  GLUCOSE 69* 140*  --  165*  BUN 36* 27*  --  34*  CREATININE 1.94* 1.70* 1.56* 2.04*  CALCIUM 9.1 9.7  --  10.2  GFRNONAA 28* 33* 36* 26*  ANIONGAP 9 12   --  11    Recent Labs  Lab 12/17/2020 1604 12/02/2020 0603  PROT 6.7 5.6*  ALBUMIN 3.3* 2.6*  AST 33 21  ALT 19 15  ALKPHOS 52 45  BILITOT 1.7* 1.0   Hematology Recent Labs  Lab 12/21/20 0636 12/22/20 0546 01-22-21 0519  WBC 9.5 10.2 9.2  RBC 4.80 4.92 5.03  HGB 14.2 14.8 14.8  HCT 45.2 45.6 47.4*  MCV 94.2 92.7 94.2  MCH 29.6 30.1 29.4  MCHC 31.4 32.5 31.2  RDW 15.6* 15.7* 15.6*  PLT 190 167 165   BNPNo results for input(s): BNP, PROBNP in the last 168 hours.  DDimer No results for input(s): DDIMER in the last 168 hours.   Radiology/Studies:  PERIPHERAL VASCULAR CATHETERIZATION  Result Date: 12/17/2020 See op note    Assessment and Plan:   Cardiac arrest  Respiratory failure PAH with cor pulmonale Chronic hypoxic lung disease on 4L O2 at baseline -  patient admitted post-lower extremity angiography with cellulitis. Patient became severely hypotensive despite IVF. She was transferred to ICU where she coded from respiratory failure, intubated,a nd then shortly after cardiac arrest with no improvement with ACLS. Patient passed away. - Patient was being treated for recurrent cellulitis. Lactic acid 9.9 - Prior RHC 3/28/291 showed peak PA pressure 92 - Echo w/ severe pulmonary hypertension with PA systolic pressure [id number] - Not a candidate for pulmonary vasodilators - PTA Tyvaso  Cellulitis of left lower extremity - hx of recurrent lower extremity cellulitis with recurrent admission requiring IV antibiotics.  - X-ray negative for osteomylitis - 2/23 lower angiogram with revascularozation - Admitted and started on IV abx - IV bolus this AM with hypotension  Chronic diastolic HF/RV failure - Echo 01/04/20 showed EF 60-65%, severely enlarged RV w/ moderate;y reduced systolic function with severe pulmonary hypertension wit PA systolic pressure of [phone] - PTA torsemide 54m daily, metolazone 2.5 + Kdur - Felt to be euvolemic on admission  Moderate AS - not  a surgical candidate due to multiple comorbidities and severe RV failure.   For questions or updates, please contact CJeromePlease consult [email or web address] for contact info under    Signed, Cadence HNinfa Meeker PA-C  2Mar 26, 202212:36 PM

## 2020-12-26 NOTE — Death Summary Note (Addendum)
Death Summary  Lindsay Harmon UVO:[id number] DOB: 05/17/1956 DOA: 01/12/21  PCP: Venia Carbon, MD PCP/Office notified: 2023/01/19 by secure chat  Admit date: 12-Jan-2021 Date of Death: 01/19/21  Final Diagnoses:  Principal Problem:   Cellulitis of left leg Active Problems:   PVD (peripheral vascular disease) ([place])   Type 2 diabetes mellitus with polyneuropathy, controlled ([place])   Essential hypertension, benign   CAD (coronary artery disease)   Chronic back pain   Chronic renal disease, stage IV (HCC)   Chronic diastolic heart failure (HCC)   Chronic respiratory failure with hypoxia (HCC)   COPD (chronic obstructive pulmonary disease) (HCC)   Cor pulmonale, chronic (HCC)   Pulmonary hypertension (HCC)   Cellulitis of left lower leg   Recurrent cellulitis   Hypoglycemia associated with diabetes ([place])   Uncontrolled type 2 diabetes mellitus with hyperglycemia (HCC)   Diabetic polyneuropathy (HCC)   Chronic diastolic CHF (congestive heart failure) (HCC)   COPD (chronic obstructive pulmonary disease) with emphysema (HCC)   Chronic pain syndrome Hypomagenesemia Acute on Chronic Respiratory Failure with Hypoxia   Circulatory Collapse due to Severe Hypotension due to acute on chronic cor pulmonale vs septic shock vs pulmonary embolic event Recurrent lower extremity cellulitis due to severe PVD Severe pulmonary hypertension with chronic respiratory failure and chronic cor pulmonale Severe atherosclerotic vascular disease with recent endovascular intervention     History of present illness & Hospital Course:  65 y/o female with past medical history of chronic diastolic CHF, CAD, CKD stage 4, chronic respiratory failure with hypoxia due to severe pulmonary hypertension and COPD/emphysema on 4 L/min home O2, HTN, uncontrolled type 2 diabetes with polyneuropathy and PVD, recently hospitalized for right lower extremity cellulitis from 1/28-2/1.  She was sent to the hospital from podiatrist  office for IV antibiotics and vascular surgery consultation due to recurrent cellulitis, this time of her left lower extremity.    Patient admitted with vascular surgery and podiatry consulted.  Cellulitis treated with IV Vancomycin.  Vascular surgery took patient for LLE angiogram and revascularization on 2/23.  During that procedure, severe atherosclerotic disease was noted to the right lower extremity arterial system that was not amenable to endovascular treatment.  Femoral endarterectomy planned for Monday 2023/01/19.  Patient remained on antibiotics for cellulitis which showed marked clinical improvement.  Course of events 2/26: - notified by pharmacy of 2 of 4 bottles in blood culture growing MRSA and Staph epidermidis, requested lab run sensitivites on staph epi.  Pt had already been on vancomycin and had been transitioned to linezolid. - 9am notified by RN of hypotension 85/67, pt with nausea/vomiting, Few moments later notified BP better reading with cuff moved, 117/80, stable BP - 11:30 am notified by RN trouble getting BP to read, next read was down to 50/20, CBG 50 given amp of D50 - At bedside with patient at this time, despite hypotension and hypoglycemia, patient was alert, oriented, talkative but facial color pale and extremities cool, no cyanosis.  She reported not feeling right since middle of the night, but no specific complaints aside from nausea.  At that time, pt was on her baseline 4 L/min oxygen, RR 18, afebrile, with no shortness of breath, chest pain, abdominal pain, leg/groin pain or swelling. She reported some mid/low back pain but not worse than her usual when it bothers her.  She was developing diffuse pitting peripheral edema, third-spacing. - Pt was transferred to stepdown/ICU for closer monitoring. - Very shortly after arrival to ICU, patient lost  consciousness while RN was in room, code blue called for respiratory arrest.  At this point, pt had a pulse - Pt intubated and  placed on the ventilator and very shortly thereafter, became bradycardic and then went into asystole/cardiac arrest. - CPR was performed and epinephrine given per ACLS protocol, but the team was unable to revive the patient.  Time of death was called and family were updated.   Time: 1340  Signed:  Ezekiel Slocumb, DO  [facility] 12/25/2020, 8:41 AM

## 2020-12-26 NOTE — Plan of Care (Signed)
  Problem: Skin Integrity: Goal: Skin integrity will improve Outcome: Progressing   Problem: Activity: Goal: Risk for activity intolerance will decrease Outcome: Progressing   Problem: Pain Managment: Goal: General experience of comfort will improve Outcome: Progressing   Problem: Skin Integrity: Goal: Risk for impaired skin integrity will decrease Outcome: Progressing

## 2020-12-26 NOTE — Progress Notes (Signed)
At 1329 code blue called after patient became bradycardic and then asystole. Chest compressions immediately started by Dr. Merrilee Jansky who was in the room with nurses and RT.  See code blue sheet. ACLS protocol followed. Code stopped at 1340 after unable to regain pulse. Dr. Merrilee Jansky and Dr. Arbutus Ped both in waiting room talking with patient's sister.  Waiting for son to arrive at hospital.

## 2020-12-26 DEATH — deceased

## 2021-02-06 ENCOUNTER — Encounter: Payer: PPO | Admitting: Internal Medicine

## 2021-06-05 ENCOUNTER — Telehealth: Payer: PPO

## 2021-06-07 ENCOUNTER — Telehealth: Payer: PPO

## 2021-08-13 IMAGING — US US EXTREM LOW VENOUS*L*
1 series · 13 of 24 positions shown · non-contrast
Comparison: None.

CLINICAL DATA: Left lower extremity edema for the past 3 weeks.
Evaluate for DVT.



[Series 1: us extrem low venous*left* · 0.08mm/px · 13 of 40 slices shown]
[im 1/40]
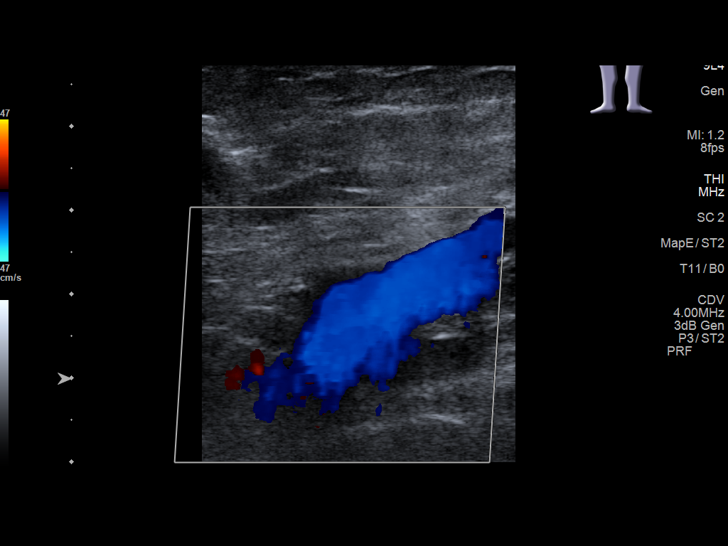
[im 4/40]
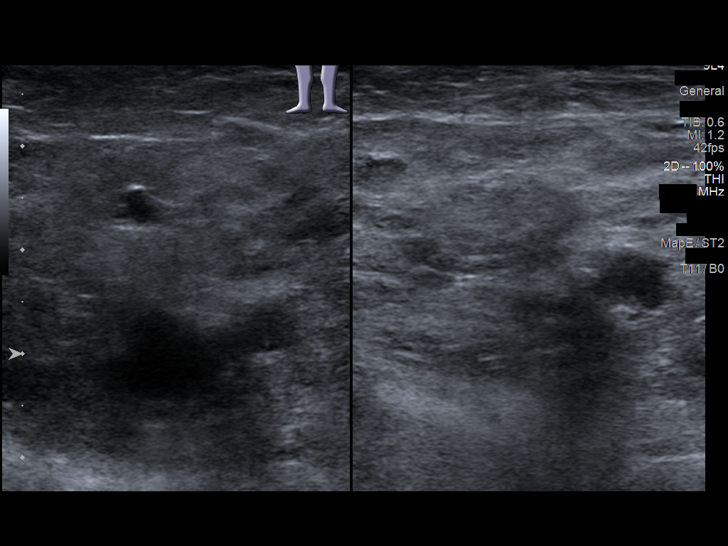
[im 7/40]
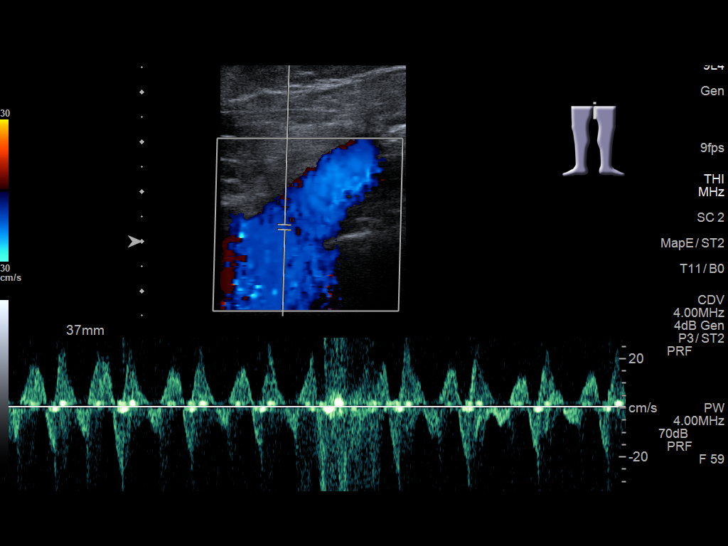
[im 11/40]
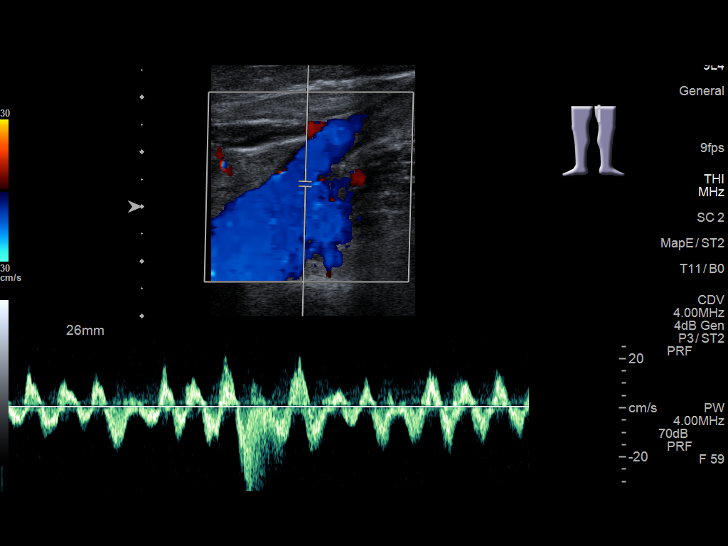
[im 14/40]
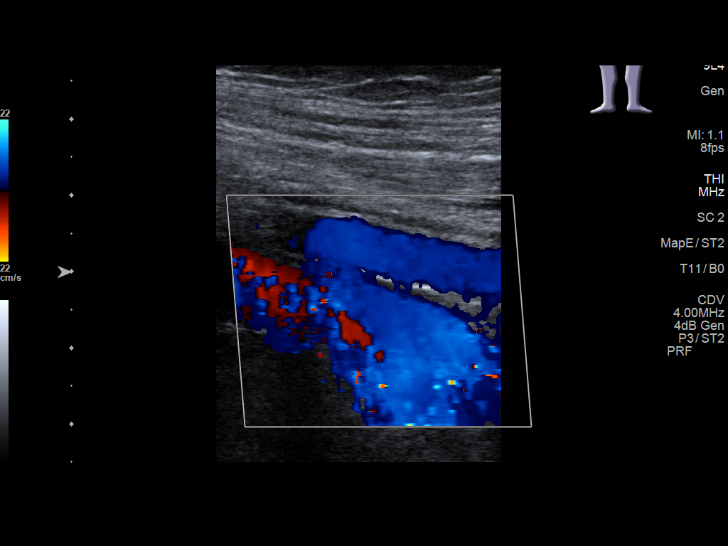
[im 17/40]
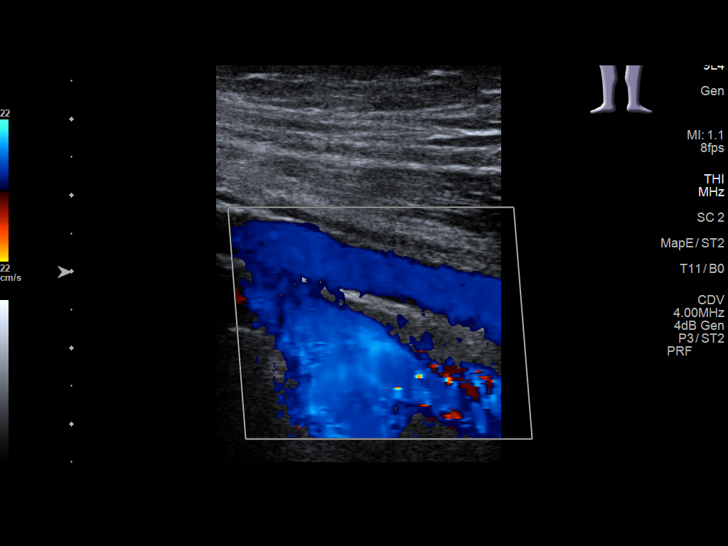
[im 21/40]
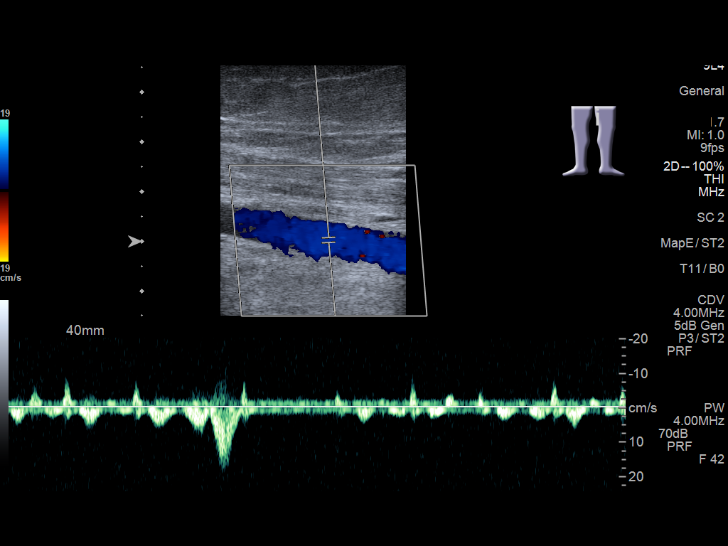
[im 23/40]
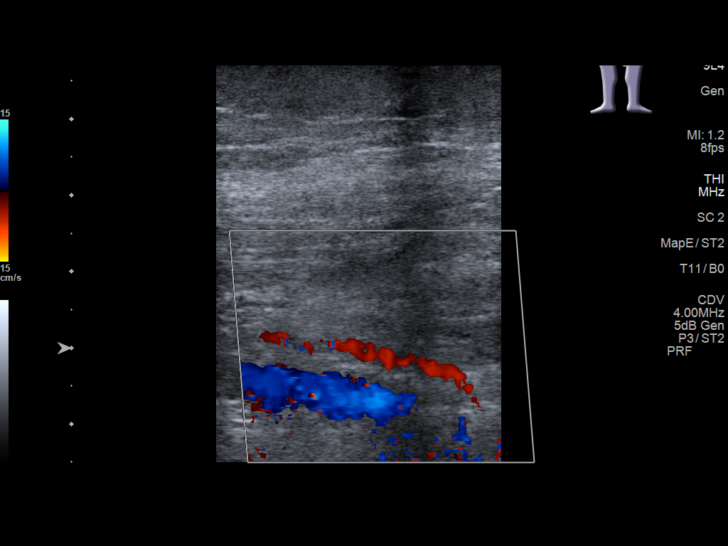
[im 26/40]
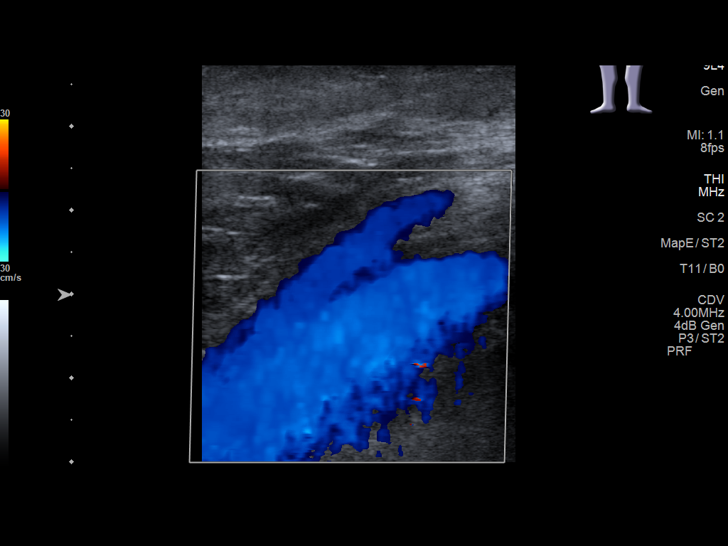
[im 29/40]
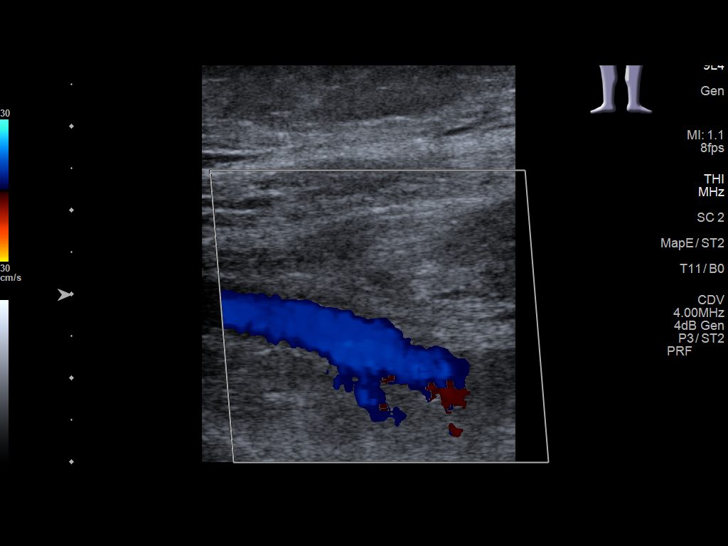
[im 33/40]
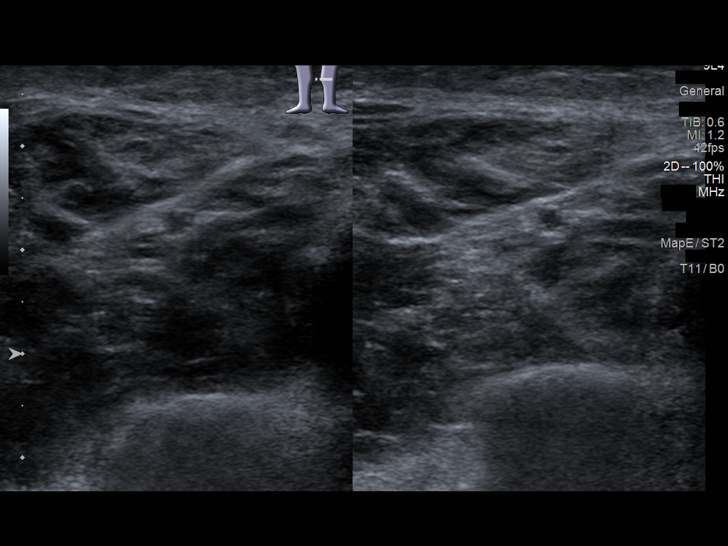
[im 36/40]
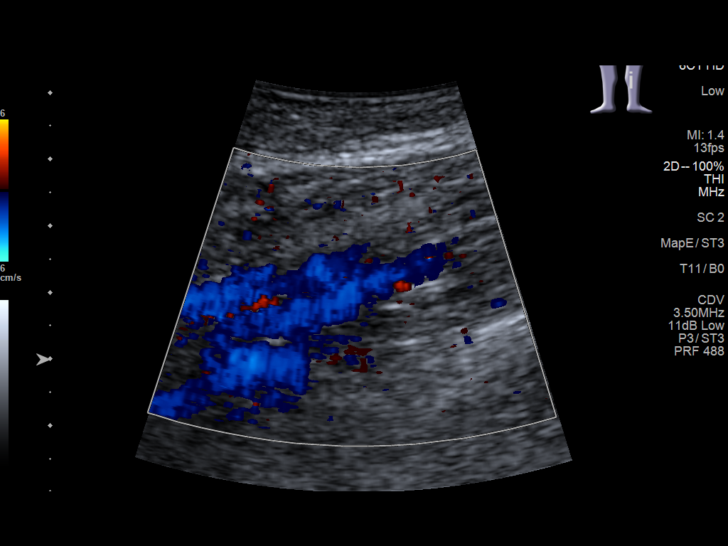
[im 40/40]
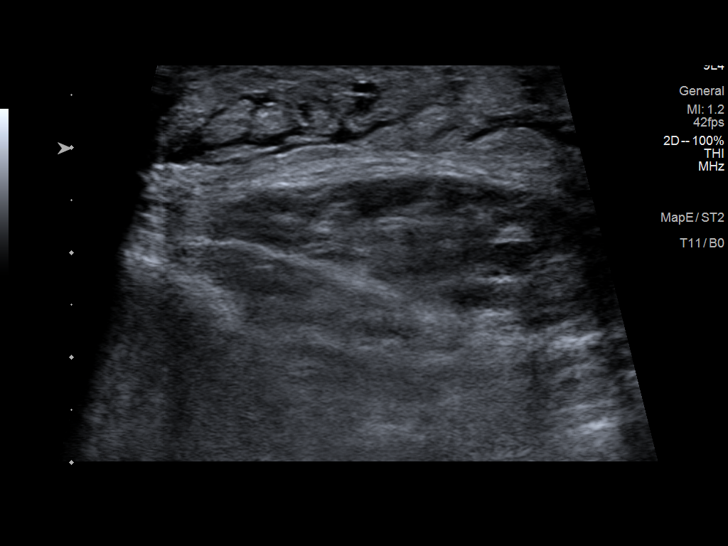

[13 of 24 positions shown; findings below may reference images not displayed]

FINDINGS: Contralateral Common Femoral Vein: Respiratory phasicity is normal
and symmetric with the symptomatic side. No evidence of thrombus.
Normal compressibility.

Common Femoral Vein: No evidence of thrombus. Normal
compressibility, respiratory phasicity and response to augmentation.

Saphenofemoral Junction: No evidence of thrombus. Normal
compressibility and flow on color Doppler imaging.

Profunda Femoral Vein: No evidence of thrombus. Normal
compressibility and flow on color Doppler imaging.

Femoral Vein: No evidence of thrombus. Normal compressibility,
respiratory phasicity and response to augmentation.

Popliteal Vein: No evidence of thrombus. Normal compressibility,
respiratory phasicity and response to augmentation.

Calf Veins: No evidence of thrombus. Normal compressibility and flow
on color Doppler imaging.

Superficial Great Saphenous Vein: No evidence of thrombus. Normal
compressibility.

Venous Reflux:  None.

Other Findings: There is a moderate to large amount of subcutaneous
edema at level the left calf (image 41).
IMPRESSION: No evidence of DVT within the left lower extremity.
